# Patient Record
Sex: Female | Born: 1950
Health system: Southern US, Community
[De-identification: ages and names within clinical notes are randomized; demographics above are authoritative.]

## PROBLEM LIST (undated history)

## (undated) DIAGNOSIS — M8580 Other specified disorders of bone density and structure, unspecified site: Secondary | ICD-10-CM

## (undated) DIAGNOSIS — R519 Headache, unspecified: Secondary | ICD-10-CM

## (undated) DIAGNOSIS — E7800 Pure hypercholesterolemia, unspecified: Secondary | ICD-10-CM

## (undated) DIAGNOSIS — C439 Malignant melanoma of skin, unspecified: Secondary | ICD-10-CM

## (undated) DIAGNOSIS — G20 Parkinson's disease: Secondary | ICD-10-CM

## (undated) DIAGNOSIS — I10 Essential (primary) hypertension: Secondary | ICD-10-CM

## (undated) DIAGNOSIS — I447 Left bundle-branch block, unspecified: Secondary | ICD-10-CM

## (undated) DIAGNOSIS — E785 Hyperlipidemia, unspecified: Secondary | ICD-10-CM

## (undated) DIAGNOSIS — G20A1 Parkinson's disease without dyskinesia, without mention of fluctuations: Secondary | ICD-10-CM

## (undated) DIAGNOSIS — M797 Fibromyalgia: Secondary | ICD-10-CM

## (undated) DIAGNOSIS — F419 Anxiety disorder, unspecified: Secondary | ICD-10-CM

## (undated) DIAGNOSIS — I341 Nonrheumatic mitral (valve) prolapse: Secondary | ICD-10-CM

## (undated) DIAGNOSIS — J189 Pneumonia, unspecified organism: Secondary | ICD-10-CM

## (undated) HISTORY — DX: Other specified disorders of bone density and structure, unspecified site: M85.80

## (undated) HISTORY — PX: BLEPHAROPLASTY: SUR158

## (undated) HISTORY — PX: TONSILLECTOMY: SHX5217

## (undated) HISTORY — PX: WISDOM TOOTH EXTRACTION: SHX21

## (undated) HISTORY — PX: TUBAL LIGATION: SHX77

## (undated) HISTORY — PX: COLONOSCOPY: SHX174

## (undated) HISTORY — DX: Fibromyalgia: M79.7

## (undated) HISTORY — PX: OTHER SURGICAL HISTORY: SHX169

## (undated) HISTORY — DX: Malignant melanoma of skin, unspecified: C43.9

## (undated) HISTORY — PX: RECONSTRUCTION OF EYELID: SHX6576

## (undated) HISTORY — DX: Pure hypercholesterolemia, unspecified: E78.00

## (undated) HISTORY — DX: Parkinson's disease: G20

## (undated) HISTORY — DX: Parkinson's disease without dyskinesia, without mention of fluctuations: G20.A1

## (undated) HISTORY — DX: Essential (primary) hypertension: I10

---

## 1998-03-06 ENCOUNTER — Emergency Department (HOSPITAL_COMMUNITY): Admission: EM | Admit: 1998-03-06 | Discharge: 1998-03-06 | Payer: Self-pay | Admitting: Emergency Medicine

## 1998-04-09 ENCOUNTER — Emergency Department (HOSPITAL_COMMUNITY): Admission: EM | Admit: 1998-04-09 | Discharge: 1998-04-09 | Payer: Self-pay | Admitting: Emergency Medicine

## 1999-01-22 ENCOUNTER — Other Ambulatory Visit: Admission: RE | Admit: 1999-01-22 | Discharge: 1999-01-22 | Payer: Self-pay | Admitting: Family Medicine

## 1999-03-15 ENCOUNTER — Ambulatory Visit (HOSPITAL_COMMUNITY): Admission: RE | Admit: 1999-03-15 | Discharge: 1999-03-15 | Payer: Self-pay | Admitting: Gastroenterology

## 2000-06-22 ENCOUNTER — Emergency Department (HOSPITAL_COMMUNITY): Admission: EM | Admit: 2000-06-22 | Discharge: 2000-06-22 | Payer: Self-pay

## 2001-06-07 ENCOUNTER — Ambulatory Visit (HOSPITAL_COMMUNITY): Admission: RE | Admit: 2001-06-07 | Discharge: 2001-06-07 | Payer: Self-pay | Admitting: Family Medicine

## 2001-06-07 ENCOUNTER — Encounter: Payer: Self-pay | Admitting: Family Medicine

## 2004-05-10 ENCOUNTER — Other Ambulatory Visit: Admission: RE | Admit: 2004-05-10 | Discharge: 2004-05-10 | Payer: Self-pay | Admitting: Family Medicine

## 2004-05-14 ENCOUNTER — Ambulatory Visit (HOSPITAL_COMMUNITY): Admission: RE | Admit: 2004-05-14 | Discharge: 2004-05-14 | Payer: Self-pay | Admitting: Gastroenterology

## 2005-05-18 ENCOUNTER — Other Ambulatory Visit: Admission: RE | Admit: 2005-05-18 | Discharge: 2005-05-18 | Payer: Self-pay | Admitting: Family Medicine

## 2006-06-19 ENCOUNTER — Other Ambulatory Visit: Admission: RE | Admit: 2006-06-19 | Discharge: 2006-06-19 | Payer: Self-pay | Admitting: Family Medicine

## 2007-06-20 ENCOUNTER — Other Ambulatory Visit: Admission: RE | Admit: 2007-06-20 | Discharge: 2007-06-20 | Payer: Self-pay | Admitting: Family Medicine

## 2008-06-19 ENCOUNTER — Other Ambulatory Visit: Admission: RE | Admit: 2008-06-19 | Discharge: 2008-06-19 | Payer: Self-pay | Admitting: Family Medicine

## 2008-09-03 ENCOUNTER — Observation Stay (HOSPITAL_COMMUNITY): Admission: EM | Admit: 2008-09-03 | Discharge: 2008-09-04 | Payer: Self-pay | Admitting: Emergency Medicine

## 2009-06-09 ENCOUNTER — Encounter: Admission: RE | Admit: 2009-06-09 | Discharge: 2009-06-09 | Payer: Self-pay | Admitting: Family Medicine

## 2009-06-22 ENCOUNTER — Other Ambulatory Visit: Admission: RE | Admit: 2009-06-22 | Discharge: 2009-06-22 | Payer: Self-pay | Admitting: Family Medicine

## 2010-12-13 LAB — CBC
HCT: 39.2 % (ref 36.0–46.0)
MCHC: 34.1 g/dL (ref 30.0–36.0)
MCV: 86.8 fL (ref 78.0–100.0)
Platelets: 267 10*3/uL (ref 150–400)
RDW: 13.5 % (ref 11.5–15.5)
WBC: 7.1 10*3/uL (ref 4.0–10.5)

## 2010-12-13 LAB — DIFFERENTIAL
Basophils Absolute: 0 10*3/uL (ref 0.0–0.1)
Eosinophils Relative: 1 % (ref 0–5)
Lymphocytes Relative: 24 % (ref 12–46)
Lymphs Abs: 1.7 10*3/uL (ref 0.7–4.0)
Monocytes Absolute: 0.4 10*3/uL (ref 0.1–1.0)
Neutro Abs: 4.9 10*3/uL (ref 1.7–7.7)

## 2010-12-13 LAB — URINE MICROSCOPIC-ADD ON

## 2010-12-13 LAB — URINALYSIS, ROUTINE W REFLEX MICROSCOPIC
Bilirubin Urine: NEGATIVE
Glucose, UA: NEGATIVE mg/dL
Hgb urine dipstick: NEGATIVE
Ketones, ur: NEGATIVE mg/dL
Nitrite: NEGATIVE
Protein, ur: NEGATIVE mg/dL
Specific Gravity, Urine: 1.022 (ref 1.005–1.030)
Urobilinogen, UA: 0.2 mg/dL (ref 0.0–1.0)
pH: 6.5 (ref 5.0–8.0)

## 2010-12-13 LAB — CARDIAC PANEL(CRET KIN+CKTOT+MB+TROPI)
CK, MB: 1 ng/mL (ref 0.3–4.0)
CK, MB: 1 ng/mL (ref 0.3–4.0)
Relative Index: INVALID (ref 0.0–2.5)
Total CK: 60 U/L (ref 7–177)
Total CK: 62 U/L (ref 7–177)

## 2010-12-13 LAB — COMPREHENSIVE METABOLIC PANEL
AST: 27 U/L (ref 0–37)
Albumin: 3.4 g/dL — ABNORMAL LOW (ref 3.5–5.2)
BUN: 10 mg/dL (ref 6–23)
Calcium: 9 mg/dL (ref 8.4–10.5)
Creatinine, Ser: 0.89 mg/dL (ref 0.4–1.2)
GFR calc Af Amer: >60 mL/min (ref >60)
Total Bilirubin: 0.7 mg/dL (ref 0.3–1.2)
Total Protein: 6.6 g/dL (ref 6.0–8.3)

## 2010-12-13 LAB — CK TOTAL AND CKMB (NOT AT ARMC)
CK, MB: 1 ng/mL (ref 0.3–4.0)
Relative Index: INVALID (ref 0.0–2.5)

## 2010-12-13 LAB — TROPONIN I: Troponin I: 0.01 ng/mL (ref 0.00–0.06)

## 2010-12-13 LAB — LIPID PANEL
LDL Cholesterol: 80 mg/dL (ref 0–99)
Triglycerides: 175 mg/dL — ABNORMAL HIGH (ref <150)
VLDL: 35 mg/dL (ref 0–40)

## 2010-12-13 LAB — POCT CARDIAC MARKERS
CKMB, poc: <1 ng/mL — ABNORMAL LOW (ref 1.0–8.0)
Troponin i, poc: <0.05 ng/mL (ref 0.00–0.09)

## 2011-01-11 NOTE — H&P (Signed)
NAME:  Morgan [name], Morgan [name] NO.:  [id number]   MEDICAL RECORD NO.:  [id number]          PATIENT TYPE:  EMS   LOCATION:  MAJO                         FACILITY:  [facility]   PHYSICIAN:  Hollice Espy, M.D.DATE OF BIRTH:  11/25/1950   DATE OF ADMISSION:  09/03/2008  DATE OF DISCHARGE:                              HISTORY & PHYSICAL   PCP:  Dario Guardian, M.D.   CHIEF COMPLAINT:  Chest pain.   HISTORY OF PRESENT ILLNESS:  The patient is a 60 year old white female  with past medical history of hyperlipidemia and hypertension and mitral  valve prolapse who this morning woke up with some chest pain located  just left of midsternal, severe, nonradiating, no associated shortness  of breath, nausea, vomiting.  She tried a change in position and it did  not help.  Her husband gave her 2 baby aspirin and it immediately  resolved just as soon as the took the aspirin.  She had no further  episodes but because of the severity of the pain, she came to the  emergency room.  In the emergency room she was noted to have a normal  sinus rhythm on EKG, initial set of cardiac markers was normal.  Labs  were done.  The only finding was a possible left lung nodule seen on  chest x-ray, however a CT scan of chest showed no evidence of any lung  nodule.  The rest of her labs were completely normal.  Because of the  patient's age and other risk factors, it was felt best that she come in  for further observation and possible treatment.  Currently the patient  is doing well.  She denies any headaches, vision changes, dysphagia,  chest pain, palpitations, shortness of breath, wheeze, cough, abdominal  pain, hematuria, dysuria, constipation, diarrhea, focal extremity  numbness weakness or pain.  Review of systems otherwise negative.   PAST MEDICAL HISTORY:  1. Includes hypertension.  2. Hyperlipidemia.  3. Night terrors.   MEDICATIONS:  She is on Evista daily, HCTZ 12.5 daily, topical  lidocaine  p.r.n., Lipitor 10 daily, Xanax 0.25 p.o. q.h.s. p.r.n.   She has allergies to [facility]  and TYLENOL.   SOCIAL HISTORY:  No tobacco, she quit in 1999, alcohol or drug use.   FAMILY HISTORY:  Notable for dad with a CVA but no real heart history.   PHYSICAL EXAM:  VITALS ON ADMISSION:  Temp 97.7, heart rate 77, blood  pressure 158/81, respirations 12, O2 sat 97% room air.  GENERAL:  She is alert and oriented x3 in no apparent distress.  The  patient is normocephalic atraumatic.  Her mucous membranes are moist.  She has no carotid bruits.  HEART:  Regular rate and rhythm, S1-S2.  She has a small audible mitral  valve murmur.  LUNGS:  Clear to auscultation bilaterally.  ABDOMEN:  Soft, nontender, nondistended, positive bowel sounds.  EXTREMITIES:  Show no clubbing, cyanosis.  She has a trace pitting  edema.   LAB WORK:  White count 7.1, H and H 13.4 and 39, MCV of 87, platelet  count 267, no shift.  Sodium 138,  potassium 3.3, chloride 100, bicarb  30, BUN 10, creatinine 0.9, glucose 97.  LFTs are noted for an albumin  of 3.4.  UA shows moderate leukocyte esterase but 3-6 white cells, few  bacteria.  CPK 51, MB less than 1, troponin I less than 0.05.  Chest x-  ray shows possible left lung nodule, otherwise unremarkable, but CT scan  of the chest shows cholelithiasis incidentally found, and  negative for  left lung nodule.   ASSESSMENT AND PLAN:  1. Chest pain, which is slightly atypical:  Check enzymes x3.      Question if the patient needs an outpatient stress test as she had      an outpatient stress test and echo done 1 year ago secondary to at      that time her mitral valve followup.  2. Hypertension:  Continue meds.  3. Hyperlipidemia:  Continue Lipitor.  4. Hypokalemia:  Will replace.      Hollice Espy, M.D.  Electronically Signed     SKK/MEDQ  D:  09/03/2008  T:  09/03/2008  Job:  [id number]   cc:   Dario Guardian, M.D.

## 2011-01-14 NOTE — Op Note (Signed)
NAME:  Morgan [name], Morgan [name] NO.:  [id number]   MEDICAL RECORD NO.:  [id number]                   PATIENT TYPE:  AMB   LOCATION:  ENDO                                 FACILITY:  [facility]   PHYSICIAN:  James L. Malon Kindle., M.D.          DATE OF BIRTH:  1951/04/24   DATE OF PROCEDURE:  05/14/2004  DATE OF DISCHARGE:                                 OPERATIVE REPORT   PROCEDURE:  Colonoscopy.   MEDICATIONS GIVEN:  Fentanyl 150 mcg, Versed 14 mg IV.   INDICATIONS FOR PROCEDURE:  Strong family history of colon cancer in her  father, this is done as a five year follow up procedure.   DESCRIPTION OF PROCEDURE:  The procedure was explained and patient consent  obtained.  With the patient in the left lateral decubitus position, the  pediatric adjustable colonoscope was inserted.  The prep was marginal, we  had to do a lot of washing and irrigating but it was adequate.  We were able  to reach the cecum using abdominal pressure and position changes.  The cecum  and ascending colon were seen well.  In the cecum were several AVMs that  were not actively bleeding.  The ascending colon, transverse colon,  descending and sigmoid colon were seen well.  No polyps were seen.  The  rectum was free of polyps.  There was no significant diverticulosis.  The  scope was withdrawn.  The patient tolerated the procedure well.   ASSESSMENT:  1.  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  2.  Strong family history of colon cancer, V16.0.   PLAN:  Will recommend repeating colonoscopy in five years and recommend  yearly hemoccults by her family physician.                                               James L. Malon Kindle., M.D.    Waldron Session  D:  05/14/2004  T:  05/14/2004  Job:  [id number]   cc:   Talmadge Coventry, M.D.  [address]  [place]  [place] [address]  Fax: [phone]

## 2012-07-04 ENCOUNTER — Other Ambulatory Visit (HOSPITAL_COMMUNITY)
Admission: RE | Admit: 2012-07-04 | Discharge: 2012-07-04 | Disposition: A | Discharge status: Home / Self Care | Payer: Federal, State, Local not specified - PPO | Source: Ambulatory Visit | Attending: Family Medicine | Admitting: Family Medicine

## 2012-07-04 ENCOUNTER — Other Ambulatory Visit: Payer: Self-pay | Admitting: Family Medicine

## 2012-07-04 DIAGNOSIS — Z0000 Encounter for general adult medical examination without abnormal findings: Secondary | ICD-10-CM | POA: Insufficient documentation

## 2012-08-02 ENCOUNTER — Ambulatory Visit (INDEPENDENT_AMBULATORY_CARE_PROVIDER_SITE_OTHER): Payer: Federal, State, Local not specified - PPO | Admitting: Licensed Clinical Social Worker

## 2012-08-02 DIAGNOSIS — F4323 Adjustment disorder with mixed anxiety and depressed mood: Secondary | ICD-10-CM

## 2012-08-16 ENCOUNTER — Ambulatory Visit (INDEPENDENT_AMBULATORY_CARE_PROVIDER_SITE_OTHER): Payer: Federal, State, Local not specified - PPO | Admitting: Licensed Clinical Social Worker

## 2012-08-16 DIAGNOSIS — F4323 Adjustment disorder with mixed anxiety and depressed mood: Secondary | ICD-10-CM

## 2012-12-24 DIAGNOSIS — Z0289 Encounter for other administrative examinations: Secondary | ICD-10-CM

## 2012-12-29 ENCOUNTER — Other Ambulatory Visit: Payer: Self-pay | Admitting: Neurology

## 2013-01-15 ENCOUNTER — Ambulatory Visit (INDEPENDENT_AMBULATORY_CARE_PROVIDER_SITE_OTHER): Payer: Federal, State, Local not specified - PPO | Admitting: Neurology

## 2013-01-15 ENCOUNTER — Encounter: Payer: Self-pay | Admitting: Neurology

## 2013-01-15 VITALS — BP 144/92 | HR 96 | Temp 97.8°F | Resp 20 | Wt 182.0 lb

## 2013-01-15 DIAGNOSIS — G20 Parkinson's disease: Secondary | ICD-10-CM | POA: Insufficient documentation

## 2013-01-15 DIAGNOSIS — G609 Hereditary and idiopathic neuropathy, unspecified: Secondary | ICD-10-CM

## 2013-01-15 DIAGNOSIS — F17200 Nicotine dependence, unspecified, uncomplicated: Secondary | ICD-10-CM

## 2013-01-15 DIAGNOSIS — G20A1 Parkinson's disease without dyskinesia, without mention of fluctuations: Secondary | ICD-10-CM | POA: Insufficient documentation

## 2013-01-15 LAB — FOLATE: Folate: 22.1 ng/mL (ref >5.9)

## 2013-01-15 LAB — TSH: TSH: 0.8 u[IU]/mL (ref 0.35–5.50)

## 2013-01-15 LAB — VITAMIN B12: Vitamin B-12: 863 pg/mL (ref 211–911)

## 2013-01-15 NOTE — Progress Notes (Addendum)
Morgan [name] was seen today in the movement disorders clinic for neurologic consultation at the request of Dr. [name] at [facility] internal medicine.  She is accompanied by her husband who helps to supplement the history.  She has previously seen Dr. neurology, both Dr. Terrace Arabia and Dr. Thad Ranger.  I reviewed some of these notes.  The consultation is for the evaluation of Parkinson's disease.  The patient is a 62 y.o. right handed female reports that her first sx was "active dreaming."  However, the symptom that got her to the dr was L greater than R hand tremor, only when moving hands/drying hair.  This was in 2010.  She was started on carbidopa/levodopa just as a "trial" for 2 weeks and then it was stopped because it worked.  She was told that it was diagnostic of PD.  She was then started on the Mirapex ER 1.5 mg in 07/2009. and it was worked up to 3.0 mg.  She reports compulsive shopping (she overspends her "allowance).  She mostly internets shops.  She has always shopped but she has previously not gone over the "allowance."  She plays the lottery and plays more than previous.  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  She tried to get off the medication but it was unsuccessful because of worsening of PD.  She was placed on Azilect.  She has been on it for about 2 years.  She has not noted any benefits.    It was her impression that it made the mirapex last longer.  She feels strongly that the computer work and shopping became an issue because of the azilect.     Specific Symptoms:  Tremor: yes but rare now.  Unsure if due to anxiety Voice: no significant change Sleep:   Vivid Dreams:  yes, but doing better with klonopin  Acting out dreams:  yes, but less of a problem with klonopin Wet Pillows: no Postural symptoms:  yes (not bad but "klutzy" all life)  Falls?  yes but only because sat on a rolling chair and fell Bradykinesia symptoms: difficulty getting off the couch Loss of smell:  no Loss of taste:   no Urinary Incontinence:  no Difficulty Swallowing:  no Handwriting, micrographia: no Trouble with ADL's:  no (except sits down to put on pants; ADL's are slower)  Trouble buttoning clothing: no Depression:  no Memory changes:  yes but minimal (has to write things down but always did) Hallucinations:  no  visual distortions: yes (occasional) N/V:  no Lightheaded:  no  Syncope: no Diplopia:  no Dyskinesia:  no  Neuroimaging has previously been performed.  It is available for my review today.  It was done in 2010.  There was mild small vessel disease.  The formal report is as follows:   Clinical Data: Tremors enhance and legs.  History of melanoma    MRI HEAD WITHOUT CONTRAST    Technique:  Multiplanar, multiecho pulse sequences of the brain and surrounding structures were obtained according to standard protocol without intravenous contrast.    Comparison: None.    Findings:    Gray-white matter differentiation is normal.  The sella is appropriate in signal and morphology.  Clival signal is intact. Cerebellar tonsils are at the level foramen magnum.  Odontoid process, the predental space and the prevertebral soft tissues are within normal limits.    Scattered lymph nodes are seen in the post triangle regions bilaterally, the largest being approximately 9 .1 mm on the left.  No abnormalities seen on diffusion weighted images.    A few scattered punctate foci of signal hyperintensity are seen in the subcortical white matter bifrontally, and to a lesser degree the biparietal subcortical white matter. Discrete small T2 and FLAIR hyperintensities are seen in the biparietal subcortical white matter adjacent to the occipital horns.    The ventricles are normal.  Mastoids are clear.  Internal auditory canals are symmetrical in signal and morphology.  Flow voids are maintained in the major vessels at the cranial skull base.    Moderate inflammatory thickening of the mucosa  in the ethmoids and the maxillary sinuses is seen.  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 No  blood breakdown products seen on SPGR sequences.    . IMPRESSION: 1.  No evidence of acute ischemia. 2.  Non specific subcortical white matter changes supratentorially .  These may reflect areas of ischemic gliosis related to small vessel disease due to hypertension and/or diabetes versus less likely a demyelinating process or vasculitis. 3.  Prominent perivascular spaces versus lacunes involving the parietal subcortical white matter. 4.  Inflammatory thickening of the mucosa in the ethmoids and the maxillary sinuses.    PREVIOUS MEDICATIONS: Mirapex and azilect; levodopa for a few weeks to "prove" benefit  ALLERGIES:   Allergies  Allergen Reactions  . Sporanox (Itraconazole)   . Tylenol (Acetaminophen)     CURRENT MEDICATIONS:  Current Outpatient Prescriptions on File Prior to Visit  Medication Sig Dispense Refill  . AZILECT 1 MG TABS TAKE 1 TABLET DAILY  90 tablet  3  . MIRAPEX ER 3 MG TB24 TAKE 1 TABLET DAILY  90 tablet  3   No current facility-administered medications on file prior to visit.    PAST MEDICAL HISTORY:   Past Medical History  Diagnosis Date  . Osteopenia   . Hypertension   . Parkinson disease   . Fibromyalgia   . Hypercholesteremia     PAST SURGICAL HISTORY:   Past Surgical History  Procedure Laterality Date  . Tonsillectomy    . Tubal ligation      SOCIAL HISTORY:   History   Social History  . Marital Status: Married    Spouse Name: N/A    Number of Children: N/A  . Years of Education: N/A   Occupational History  . Not on file.   Social History Main Topics  . Smoking status: Former Smoker    Quit date: 01/15/1998  . Smokeless tobacco: Never Used  . Alcohol Use: Yes     Comment: monthly glass of wine.  . Drug Use: No  . Sexually  Active: Not on file   Other Topics Concern  . Not on file   Social History Narrative  . No narrative on file    FAMILY HISTORY:   No family status information on file.    ROS:  Feels like walks on "rocks."  A complete 10 system review of systems was obtained and was unremarkable apart from what is mentioned above.  PHYSICAL EXAMINATION:    VITALS:   Filed Vitals:   01/15/13 1026  BP: 144/92  Pulse: 96  Temp: 97.8 F (36.6 C)  Resp: 20  Weight: 182 lb (82.555 kg)    GEN:  The patient appears stated age and is in NAD. HEENT:  Normocephalic, atraumatic.  The mucous membranes are moist. The superficial temporal arteries are without ropiness or  tenderness. CV:  RRR Lungs:  CTAB Neck/HEME:  There are no carotid bruits bilaterally.  Neurological examination:  Orientation: The patient is alert and oriented x3. Fund of knowledge is appropriate.  Recent and remote memory are intact.  Attention and concentration are normal.    Able to name objects and repeat phrases. Cranial nerves: There is good facial symmetry. Pupils are equal round and reactive to light bilaterally. Fundoscopic exam reveals clear margins bilaterally. Extraocular muscles are intact. The visual fields are full to confrontational testing. The speech is fluent and clear. Soft palate rises symmetrically and there is no tongue deviation. Hearing is intact to conversational tone. Sensation: Sensation is intact to light and pinprick throughout (facial, trunk, extremities). Pinprick is decreased in a distal fashion (stocking distribution).  Vibration is decreased distally. There is no extinction with double simultaneous stimulation. There is no sensory dermatomal level identified. Motor: Strength is 5/5 in the bilateral upper and lower extremities.   Shoulder shrug is equal and symmetric.  There is no pronator drift. Deep tendon reflexes: Deep tendon reflexes are 3/4 at the bilateral biceps, triceps, brachioradialis, patella  and achilles. Plantar responses are downgoing bilaterally.  Movement examination: Tone: There is increased tone in the bilateral upper extremities, L worse than R but overall mild increased bilaterally and mod with activation procedures.  The tone in the lower extremities is normal.  Abnormal movements: none Coordination:  There is definite decremation with RAM's, seen most prominantely on the L but it was on the R as well with heel taps and finger taps. Gait and Station: The patient has no difficulty arising out of a deep-seated chair without the use of the hands. The patient's stride length is normal with slight decrease arm swing on the L.  The patient has a negative pull test.      ASSESSMENT/PLAN:  1.  Parkinsonism.  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    -We discussed the diagnosis as well as pathophysiology of the disease.  We discussed treatment options as well as prognostic indicators.  Patient education was provided.  -Greater than 50% of the 80 minute visit was spent in counseling answering questions and talking about what to expect now as well as in the future.  We talked about medication options as well as potential future surgical options.  We talked about safety in the home.  -I am concerned about the patient's Mirapex.  She is having compulsive behaviors.  I wanted to decrease the dose.  She tried this in the past, but does not think that it was as effective, but admits that the dosage was down to 1.5 mg every other day.  I wanted to try and decrease the dosage to Mirapex ER 2.25 mg daily, but in the end the patient wanted to stop the Azilect first.  I feel confident that the Azilect likely is not doing much for her, so I have no objection to this, but I also don't think is causing the compulsive behaviors, which are well known side effects of the agonists.  -I will refer the patient to the Parkinson's program at the [facility].  We talked about the importance of cardiovascular exercise in Parkinson's disease.  -I. gave the patient and her husband a booklet regarding nutrition in Parkinson's. 2.  probable peripheral neuropathy, idiopathic.  -She does have physical examination evidence of this, and is complaining about feeling like she is walking on rocks.  I am going to check  a B12, folate, RPR and serum and urinary protein electrophoresis.  On 01/07/2013 she had a glucose that was 75.  We talked about safety associated with peripheral neuropathy.  -A 2010 NCV done at [facility] was normal but it doesn't appear that EMG was done. 3.  history of melanoma.  -The patient did not tell me this in the exam room, and it is not in her primary care records I received.  However, once she left and it the official report of her MRI, it was listed there.  I will discuss this at her next visit.  There is some education needed if we decide to start levodopa in the future. 4.  history of tobacco abuse.  -While the patient has given up smoking, she is using quite a bit of Nicorette gum per day.  I talked to her about the fact that she really needs to stop this as well.  She asked me if it was protective for Parkinson's, and I told her that while smoking may decrease her risk factor for Parkinson's, there is no evidence that using tobacco products slows Parkinson's one has the diagnosis. 5.  Follow up in 3 months.  She is to call me in 2 weeks and let me know how she is doing of Azilect. We will then consider decreasing the Mirapex. 6.  Face to face time was 85 min

## 2013-01-15 NOTE — Patient Instructions (Addendum)
1.  Stop azilect 2.  Call me in 2 weeks.  If shopping and computer use not better, then I will decrease your mirapex 3.  Exercise safely 4.  I will send referrals for the Parkinosons program therapies....let me know if they dont contact you 5.  I will see you in 3 months but feel free to call if you need me before that

## 2013-01-15 NOTE — Addendum Note (Signed)
Addended by: Benay Spice on: 01/15/2013 03:13 PM   Modules accepted: Orders

## 2013-01-15 NOTE — Addendum Note (Signed)
Addended by: Benay Spice on: 01/15/2013 02:10 PM   Modules accepted: Orders

## 2013-01-17 LAB — IMMUNOFIXATION ELECTROPHORESIS
IgA: 177 mg/dL (ref 69–380)
IgG (Immunoglobin G), Serum: 1090 mg/dL (ref 690–1700)
IgM, Serum: 240 mg/dL (ref 52–322)
Total Protein, Serum Electrophoresis: 7.1 g/dL (ref 6.0–8.3)
Total Protein, Serum Electrophoresis: 7.1 g/dL (ref 6.0–8.3)

## 2013-01-17 LAB — PROTEIN ELECTROPHORESIS, SERUM
Gamma Globulin: 16 % (ref 11.1–18.8)
Total Protein, Serum Electrophoresis: 7.2 g/dL (ref 6.0–8.3)

## 2013-01-17 LAB — PROTEIN ELECTROPHORESIS, URINE REFLEX: Total Protein, Urine: 5 mg/dL

## 2013-01-18 ENCOUNTER — Ambulatory Visit: Payer: Federal, State, Local not specified - PPO | Attending: Neurology | Admitting: Physical Therapy

## 2013-01-18 DIAGNOSIS — G20A1 Parkinson's disease without dyskinesia, without mention of fluctuations: Secondary | ICD-10-CM | POA: Insufficient documentation

## 2013-01-18 DIAGNOSIS — R269 Unspecified abnormalities of gait and mobility: Secondary | ICD-10-CM | POA: Insufficient documentation

## 2013-01-18 DIAGNOSIS — G20 Parkinson's disease: Secondary | ICD-10-CM | POA: Insufficient documentation

## 2013-01-18 DIAGNOSIS — IMO0001 Reserved for inherently not codable concepts without codable children: Secondary | ICD-10-CM | POA: Insufficient documentation

## 2013-01-22 ENCOUNTER — Ambulatory Visit: Payer: Federal, State, Local not specified - PPO | Admitting: Physical Therapy

## 2013-01-29 ENCOUNTER — Ambulatory Visit: Payer: Federal, State, Local not specified - PPO | Attending: Neurology | Admitting: Physical Therapy

## 2013-01-29 DIAGNOSIS — G20A1 Parkinson's disease without dyskinesia, without mention of fluctuations: Secondary | ICD-10-CM | POA: Insufficient documentation

## 2013-01-29 DIAGNOSIS — G20 Parkinson's disease: Secondary | ICD-10-CM | POA: Insufficient documentation

## 2013-01-29 DIAGNOSIS — IMO0001 Reserved for inherently not codable concepts without codable children: Secondary | ICD-10-CM | POA: Insufficient documentation

## 2013-01-29 DIAGNOSIS — R269 Unspecified abnormalities of gait and mobility: Secondary | ICD-10-CM | POA: Insufficient documentation

## 2013-01-31 ENCOUNTER — Ambulatory Visit: Payer: Federal, State, Local not specified - PPO | Admitting: Physical Therapy

## 2013-02-05 ENCOUNTER — Ambulatory Visit: Payer: Federal, State, Local not specified - PPO | Admitting: Occupational Therapy

## 2013-02-05 ENCOUNTER — Ambulatory Visit: Payer: Federal, State, Local not specified - PPO | Admitting: Physical Therapy

## 2013-02-06 ENCOUNTER — Telehealth: Payer: Self-pay

## 2013-02-06 NOTE — Telephone Encounter (Signed)
Noted.  Thanks for calling.  Will discuss more in detail at next visit.

## 2013-02-06 NOTE — Telephone Encounter (Signed)
Pt calling to update [place] on how she is doing off the Azilect.  She said she is doing very well she no longer has the impulsive shopping urges she was having.

## 2013-02-07 ENCOUNTER — Ambulatory Visit: Payer: Federal, State, Local not specified - PPO | Admitting: Physical Therapy

## 2013-02-12 ENCOUNTER — Ambulatory Visit: Payer: Federal, State, Local not specified - PPO | Admitting: Occupational Therapy

## 2013-02-12 ENCOUNTER — Ambulatory Visit: Payer: Federal, State, Local not specified - PPO | Admitting: Physical Therapy

## 2013-02-14 ENCOUNTER — Ambulatory Visit: Payer: Federal, State, Local not specified - PPO | Admitting: Occupational Therapy

## 2013-02-14 ENCOUNTER — Ambulatory Visit: Payer: Federal, State, Local not specified - PPO | Admitting: Physical Therapy

## 2013-02-19 ENCOUNTER — Ambulatory Visit: Payer: Federal, State, Local not specified - PPO | Admitting: Physical Therapy

## 2013-02-21 ENCOUNTER — Ambulatory Visit: Payer: Federal, State, Local not specified - PPO | Admitting: Physical Therapy

## 2013-03-05 ENCOUNTER — Ambulatory Visit: Payer: Federal, State, Local not specified - PPO | Attending: Neurology | Admitting: Occupational Therapy

## 2013-03-05 ENCOUNTER — Ambulatory Visit: Payer: Federal, State, Local not specified - PPO | Admitting: Physical Therapy

## 2013-03-05 DIAGNOSIS — IMO0001 Reserved for inherently not codable concepts without codable children: Secondary | ICD-10-CM | POA: Insufficient documentation

## 2013-03-05 DIAGNOSIS — G20A1 Parkinson's disease without dyskinesia, without mention of fluctuations: Secondary | ICD-10-CM | POA: Insufficient documentation

## 2013-03-05 DIAGNOSIS — R269 Unspecified abnormalities of gait and mobility: Secondary | ICD-10-CM | POA: Insufficient documentation

## 2013-03-05 DIAGNOSIS — G20 Parkinson's disease: Secondary | ICD-10-CM | POA: Insufficient documentation

## 2013-03-06 ENCOUNTER — Other Ambulatory Visit: Payer: Self-pay | Admitting: Family Medicine

## 2013-03-07 ENCOUNTER — Ambulatory Visit: Payer: Federal, State, Local not specified - PPO | Admitting: Physical Therapy

## 2013-03-07 ENCOUNTER — Ambulatory Visit: Payer: Federal, State, Local not specified - PPO | Admitting: Occupational Therapy

## 2013-03-11 ENCOUNTER — Ambulatory Visit
Admission: RE | Admit: 2013-03-11 | Discharge: 2013-03-11 | Disposition: A | Discharge status: Home / Self Care | Payer: Federal, State, Local not specified - PPO | Source: Ambulatory Visit | Attending: Family Medicine | Admitting: Family Medicine

## 2013-03-12 ENCOUNTER — Ambulatory Visit: Payer: Federal, State, Local not specified - PPO | Admitting: Occupational Therapy

## 2013-03-14 ENCOUNTER — Ambulatory Visit: Payer: Federal, State, Local not specified - PPO | Admitting: Occupational Therapy

## 2013-03-19 ENCOUNTER — Ambulatory Visit: Payer: Federal, State, Local not specified - PPO | Admitting: Occupational Therapy

## 2013-03-21 ENCOUNTER — Ambulatory Visit: Payer: Federal, State, Local not specified - PPO | Admitting: Occupational Therapy

## 2013-04-05 ENCOUNTER — Ambulatory Visit: Payer: Self-pay | Admitting: Nurse Practitioner

## 2013-04-16 ENCOUNTER — Ambulatory Visit: Payer: Federal, State, Local not specified - PPO | Admitting: Neurology

## 2013-04-19 ENCOUNTER — Encounter: Payer: Self-pay | Admitting: Neurology

## 2013-04-19 ENCOUNTER — Ambulatory Visit (INDEPENDENT_AMBULATORY_CARE_PROVIDER_SITE_OTHER): Payer: Federal, State, Local not specified - PPO | Admitting: Neurology

## 2013-04-19 VITALS — BP 128/88 | HR 116 | Temp 98.1°F | Resp 20 | Wt 183.0 lb

## 2013-04-19 DIAGNOSIS — G20 Parkinson's disease: Secondary | ICD-10-CM

## 2013-04-19 DIAGNOSIS — G609 Hereditary and idiopathic neuropathy, unspecified: Secondary | ICD-10-CM

## 2013-04-19 DIAGNOSIS — F17200 Nicotine dependence, unspecified, uncomplicated: Secondary | ICD-10-CM

## 2013-04-19 MED ORDER — CARBIDOPA-LEVODOPA 25-100 MG PO TABS: 1.0000 | ORAL_TABLET | Freq: Three times a day (TID) | ORAL | Status: DC

## 2013-04-19 MED ORDER — CYMBALTA 60 MG PO CPEP: 60.0000 mg | ORAL_CAPSULE | Freq: Every day | ORAL | Status: DC

## 2013-04-19 MED ORDER — MIRAPEX ER 3 MG PO TB24: 1.0000 | ORAL_TABLET | Freq: Every day | ORAL | Status: DC

## 2013-04-19 MED ORDER — CLONAZEPAM 1 MG PO TABS: 1.0000 mg | ORAL_TABLET | Freq: Every day | ORAL | Status: DC

## 2013-04-19 NOTE — Patient Instructions (Addendum)
1.  Start carbidopa/levodopa as follows:      Week 1:  1/2 tab three times a day before meals x 1 wk, Week 2:  1/2 in am & noon & 1 in evening for a week, Week 3  1/2 in am &1 at noon &one in evening for a week, Week 4:  1 tablet three times a day before meals 2.  Exercise 3.  Brand name mirapex and brand name cymbalta sent to [facility].  Others will go to local pharmacy 4.  I will see you back in December.  Enjoy your vacations!

## 2013-04-19 NOTE — Progress Notes (Signed)
Morgan [name] was seen today in the movement disorders clinic for neurologic consultation at the request of Dr. [name] at [facility].  She is accompanied by her husband who helps to supplement the history.  She has previously seen Dr. neurology, both Dr. Terrace Arabia and Dr. Thad Ranger.  I reviewed some of these notes.  The consultation is for the evaluation of Parkinson's disease.  The patient is a 62 y.o. right handed female reports that her first sx was "active dreaming."  However, the symptom that got her to the dr was L greater than R hand tremor, only when moving hands/drying hair.  This was in 2010.  She was started on carbidopa/levodopa just as a "trial" for 2 weeks and then it was stopped because it worked.  She was told that it was diagnostic of PD.  She was then started on the Mirapex ER 1.5 mg in 07/2009. and it was worked up to 3.0 mg.  She reports compulsive shopping (she overspends her "allowance).  She mostly internets shops.  She has always shopped but she has previously not gone over the "allowance."  She plays the lottery and plays more than previous.  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  She tried to get off the medication but it was unsuccessful because of worsening of PD.  She was placed on Azilect.  She has been on it for about 2 years.  She has not noted any benefits.    It was her impression that it made the mirapex last longer.  She feels strongly that the computer work and shopping became an issue because of the azilect.   04/19/13 update:  The patient presents today with her husband who supplements the history.  Once she discontinued the Azilect, she felt that her compulsive shopping got better.  Her husband feels that she is not exercising enough, but the patient estimates that she perhaps exercises 4 days per week, but not to the intensity that her husband would like.  She had one near fall since our last visit, but she was on a trail in [place] with 2 walking sticks and  one of them something to the ground and her husband caught her.  She is still taking clonazepam at night.  Her husband thinks that she takes it too early and falls asleep on the couch, but she denies this.  There have not been hallucinations.  There has not been nausea or vomiting.  She has been slower.    Neuroimaging has previously been performed.  It is available for my review today.  It was done in 2010.  There was mild small vessel disease.  The formal report is as follows:   Clinical Data: Tremors enhance and legs.  History of melanoma    MRI HEAD WITHOUT CONTRAST    Technique:  Multiplanar, multiecho pulse sequences of the brain and surrounding structures were obtained according to standard protocol without intravenous contrast.    Comparison: None.    Findings:    Gray-white matter differentiation is normal.  The sella is appropriate in signal and morphology.  Clival signal is intact. Cerebellar tonsils are at the level foramen magnum.  Odontoid process, the predental space and the prevertebral soft tissues are within normal limits.    Scattered lymph nodes are seen in the post triangle regions bilaterally, the largest being approximately 9 .1 mm on the left.    No abnormalities seen on diffusion weighted images.    A few scattered punctate foci  of signal hyperintensity are seen in the subcortical white matter bifrontally, and to a lesser degree the biparietal subcortical white matter. Discrete small T2 and FLAIR hyperintensities are seen in the biparietal subcortical white matter adjacent to the occipital horns.    The ventricles are normal.  Mastoids are clear.  Internal auditory canals are symmetrical in signal and morphology.  Flow voids are maintained in the major vessels at the cranial skull base.    Moderate inflammatory thickening of the mucosa in the ethmoids and the maxillary sinuses is seen.  The visualized orbital contents are grossly normal though  somewhat obscured by artifact related to a non removal bridge  in the upper mouth. No  blood breakdown products seen on SPGR sequences.    . IMPRESSION: 1.  No evidence of acute ischemia. 2.  Non specific subcortical white matter changes supratentorially .  These may reflect areas of ischemic gliosis related to small vessel disease due to hypertension and/or diabetes versus less likely a demyelinating process or vasculitis. 3.  Prominent perivascular spaces versus lacunes involving the parietal subcortical white matter. 4.  Inflammatory thickening of the mucosa in the ethmoids and the maxillary sinuses.    PREVIOUS MEDICATIONS: Mirapex and azilect; levodopa for a few weeks to "prove" benefit  ALLERGIES:   Allergies  Allergen Reactions  . Sporanox [Itraconazole]   . Tylenol [Acetaminophen]     CURRENT MEDICATIONS:  Current Outpatient Prescriptions on File Prior to Visit  Medication Sig Dispense Refill  . aspirin 325 MG tablet Take 325 mg by mouth daily.      Marland Kitchen atorvastatin (LIPITOR) 10 MG tablet Take 10 mg by mouth daily.      . cholecalciferol (VITAMIN D) 1000 UNITS tablet Take 1,000 Units by mouth daily. Two daily      . co-enzyme Q-10 30 MG capsule Take 30 mg by mouth daily. Taking 60mg       . DULoxetine (CYMBALTA) 60 MG capsule Take 60 mg by mouth daily.      Chilton Si Tea 250 MG CAPS Take by mouth daily.      . hydrochlorothiazide (HYDRODIURIL) 25 MG tablet Take 25 mg by mouth daily.      . Magnesium 250 MG TABS Take by mouth 2 (two) times daily.      Marland Kitchen MIRAPEX ER 3 MG TB24 TAKE 1 TABLET DAILY  90 tablet  3  . Multiple Vitamins-Minerals (ICAPS PO) Take by mouth.      . raloxifene (EVISTA) 60 MG tablet Take 60 mg by mouth daily.       No current facility-administered medications on file prior to visit.    PAST MEDICAL HISTORY:   Past Medical History  Diagnosis Date  . Osteopenia   . Hypertension   . Parkinson disease   . Fibromyalgia   . Hypercholesteremia      controlled on medication  . Melanoma     PAST SURGICAL HISTORY:   Past Surgical History  Procedure Laterality Date  . Tonsillectomy    . Tubal ligation      SOCIAL HISTORY:   History   Social History  . Marital Status: Married    Spouse Name: N/A    Number of Children: N/A  . 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 Smoking status: Former Smoker    Quit date: 01/15/1998  . Smokeless tobacco: Never Used  Comment: chews Nicorette, 10-12/day  . Alcohol Use: Yes     Comment: monthly glass of wine.  . Drug Use: No  . Sexual Activity: Not on file   Other Topics Concern  . Not on file   Social History Narrative  . No narrative on file    FAMILY HISTORY:   Family Status  Relation Status Death Age  . Mother Deceased     colon CA  . Father Alive     healthy  . Brother Alive     healthy  . Sister Alive     4, healthy  . Child Alive     healthy    ROS:  Feels like walks on "rocks."  A complete 10 system review of systems was obtained and was unremarkable apart from what is mentioned above.  PHYSICAL EXAMINATION:    VITALS:   Filed Vitals:   04/19/13 1451  BP: 128/88  Pulse: 116  Temp: 98.1 F (36.7 C)  Resp: 20  Weight: 183 lb (83.008 kg)    GEN:  The patient appears stated age and is in NAD. HEENT:  Normocephalic, atraumatic.  The mucous membranes are moist. The superficial temporal arteries are without ropiness or tenderness. CV:  RRR Lungs:  CTAB Neck/HEME:  There are no carotid bruits bilaterally.  Neurological examination:  Orientation: The patient is alert and oriented x3. Fund of knowledge is appropriate.  Recent and remote memory are intact.  Attention and concentration are normal.    Able to name objects and repeat phrases. Cranial nerves: There is good facial symmetry.  There is bilateral pseudoptosis from lid lag. Pupils are equal round and reactive to  light bilaterally. Fundoscopic exam reveals clear margins bilaterally. Extraocular muscles are intact. The visual fields are full to confrontational testing. The speech is fluent and clear. Soft palate rises symmetrically and there is no tongue deviation. Hearing is intact to conversational tone. Sensation: Sensation is intact to light and pinprick throughout (facial, trunk, extremities). Pinprick is decreased in a distal fashion (stocking distribution).  Vibration is decreased distally. There is no extinction with double simultaneous stimulation. There is no sensory dermatomal level identified. Motor: Strength is 5/5 in the bilateral upper and lower extremities.   Shoulder shrug is equal and symmetric.  There is no pronator drift. Deep tendon reflexes: Deep tendon reflexes are 3/4 at the bilateral biceps, triceps, brachioradialis, patella and achilles. Plantar responses are downgoing bilaterally.  Movement examination: Tone: There is increased tone in the left upper extremity, overall moderate.  The tone in the right upper extremity is normal.  The tone in the lower extremities is normal.  Abnormal movements: none Coordination:  There is definite decremation with RAM's, seen most prominantely on the L but it was on the R as well with heel taps and finger taps. Gait and Station: The patient has no difficulty arising out of a deep-seated chair without the use of the hands. The patient's stride length is normal with slight decrease arm swing on the L.  The patient has a negative pull test.      LABS:  Lab Results  Component Value Date   TSH 0.80 01/15/2013   Lab Results  Component Value Date   VITAMINB12 863 01/15/2013    SPEP/UPEP negative.    Lab Results  Component Value Date   FOLATE 22.1 01/15/2013     ASSESSMENT/PLAN:  1.  Parkinsonism.  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    -We  discussed the diagnosis as well as pathophysiology of the  disease.  We discussed treatment options as well as prognostic indicators.  Patient education was provided.  -I am concerned about the patient's Mirapex.  She was having compulsive behaviors.  She feels that this went away with the discontinuation of the Azilect, but we will need to watch this if we are going to maintain her on Mirapex.  I do not want to increase this dosage.  -I think that the patient would benefit from the addition of levodopa therapy.  We talked extensively about risks and benefits.  I will give her a titration so that she work her way up to carbidopa/levodopa 25/100, one tablet 3 times a day.  -We talked extensively about the importance of cardiovascular exercise. 2.  probable peripheral neuropathy, idiopathic.  -She does have physical examination evidence of this.  Her laboratory workup has been unremarkable.  She is on Cymbalta, although that is primarily for fibromyalgia. 3.  history of melanoma.  -There is some old literature that suggests that the addition of levodopa can increase risk of return melanoma, but new literature suggests that this is not the etiology.  4.  history of tobacco abuse.  -While the patient has given up smoking, she is using quite a bit of Nicorette gum per day.  I talked to her about the fact that she really needs to stop this as well.  She asked me if it was protective for Parkinson's, and I told her that while smoking may decrease her risk factor for Parkinson's, there is no evidence that using tobacco products slows Parkinson's one has the diagnosis. 5.  Insomnia.  -She is doing fine with clonazepam.  Risks, benefits, side effects and alternative therapies were discussed.  The opportunity to ask questions was given and they were answered to the best of my ability.  The patient expressed understanding and willingness to follow the outlined treatment protocols. 6.  Face to face time was 45 min

## 2013-04-25 ENCOUNTER — Encounter (INDEPENDENT_AMBULATORY_CARE_PROVIDER_SITE_OTHER): Payer: Self-pay | Admitting: General Surgery

## 2013-04-25 ENCOUNTER — Ambulatory Visit (INDEPENDENT_AMBULATORY_CARE_PROVIDER_SITE_OTHER): Payer: BC Managed Care – PPO | Admitting: General Surgery

## 2013-04-25 VITALS — BP 142/100 | HR 100 | Temp 98.1°F | Resp 15 | Ht 65.0 in | Wt 184.0 lb

## 2013-04-25 DIAGNOSIS — K8020 Calculus of gallbladder without cholecystitis without obstruction: Secondary | ICD-10-CM

## 2013-04-25 NOTE — Progress Notes (Signed)
Patient ID: Morgan [name], female   DOB: [birth year], 62 y.o.   MRN: [id number]  Chief Complaint  Patient presents with  . New Evaluation    GB    HPI Morgan [name] is a 62 y.o. female.   HPI  She is referred by Dr. Katrinka Blazing because of cholelithiasis. She had an elevation of one of her liver function tests. An ultrasound was performed which demonstrated diffuse fatty infiltration of the liver. Multiple gallstones were seen. The gallbladder wall was not thickened.  Common bile duct diameter was normal.  She is essentially asymptomatic from this at this time. No family history of gallbladder disease.  Past Medical History  Diagnosis Date  . Osteopenia   . Hypertension   . Parkinson disease   . Fibromyalgia   . Hypercholesteremia     controlled on medication  . Melanoma     Past Surgical History  Procedure Laterality Date  . Tonsillectomy    . Tubal ligation      Family History  Problem Relation Age of Onset  . Cancer Mother     colon  . Cancer Maternal Grandmother     colon    Social History History  Substance Use Topics  . Smoking status: Former Smoker    Quit date: 09/29/1997  . Smokeless tobacco: Never Used     Comment: chews Nicorette, 10-12/day  . Alcohol Use: Yes     Comment: monthly glass of wine.    Allergies  Allergen Reactions  . Sporanox [Itraconazole]   . Tylenol [Acetaminophen]     Current Outpatient Prescriptions  Medication Sig Dispense Refill  . ALPRAZolam (XANAX) 0.25 MG tablet Take 0.25 mg by mouth 1 day or 1 dose.      Marland Kitchen aspirin 325 MG tablet Take 325 mg by mouth daily.      Marland Kitchen atorvastatin (LIPITOR) 10 MG tablet Take 10 mg by mouth daily.      . carbidopa-levodopa (SINEMET IR) 25-100 MG per tablet Take 1 tablet by mouth 3 (three) times daily.  90 tablet  5  . cholecalciferol (VITAMIN D) 1000 UNITS tablet Take 1,000 Units by mouth daily. Two daily      . clonazePAM (KLONOPIN) 1 MG tablet Take 1 tablet (1 mg total) by mouth at bedtime.  30  tablet  4  . co-enzyme Q-10 30 MG capsule Take 30 mg by mouth daily. Taking 60mg       . CYMBALTA 60 MG capsule Take 1 capsule (60 mg total) by mouth daily.  90 capsule  3  . Green Tea 250 MG CAPS Take by mouth daily.      . hydrochlorothiazide (HYDRODIURIL) 25 MG tablet Take 25 mg by mouth daily.      . Magnesium 250 MG TABS Take by mouth 2 (two) times daily.      . methocarbamol (ROBAXIN) 750 MG tablet Take 750 mg by mouth 4 (four) times daily.      Marland Kitchen MIRAPEX ER 3 MG TB24 Take 1 tablet (3 mg total) by mouth daily.  90 tablet  3  . Multiple Vitamins-Minerals (ICAPS PO) Take by mouth.      . raloxifene (EVISTA) 60 MG tablet Take 60 mg by mouth daily.       No current facility-administered medications for this visit.    Review of Systems Review of Systems  Constitutional: Negative.   Respiratory: Negative.   Cardiovascular: Negative.   Gastrointestinal: Positive for constipation. Negative for abdominal pain.    Blood pressure  142/100, pulse 100, temperature 98.1 F (36.7 C), temperature source Temporal, resp. rate 15, height 5\' 5"  (1.651 m), weight 184 lb (83.462 kg).  Physical Exam Physical Exam  Constitutional: No distress.  overweight  HENT:  Head: Normocephalic and atraumatic.  Eyes: EOM are normal. No scleral icterus.  Abdominal: Soft. She exhibits no distension and no mass. There is no tenderness.  Neurological: She is alert.  Skin: Skin is warm and dry.    Data Reviewed Ultrasound report. No children Dr. Katrinka Blazing. Laboratory tests.  Assessment    1. Asymptomatic cholelithiasis. Currently, I do not think she is an indication for cholecystectomy.  2.  Diffuse fatty infiltration of the liver. This is the more concerning problem. This was Lipitor could explain her liver function test abnormality.     Plan    Strict low fat diet was advised. Small potential to develop cirrhosis due to fatty liver was discussed with her. If she develops symptomatic cholelithiasis, I have  advised her to call back and then we can discuss cholecystectomy.        Earl Losee J 04/25/2013, 1:57 PM

## 2013-04-25 NOTE — Patient Instructions (Signed)
Start low fat diet as we discussed. If you have any symptoms of gallbladder problems please call.

## 2013-05-28 ENCOUNTER — Encounter (INDEPENDENT_AMBULATORY_CARE_PROVIDER_SITE_OTHER): Payer: Self-pay

## 2013-07-05 ENCOUNTER — Encounter (INDEPENDENT_AMBULATORY_CARE_PROVIDER_SITE_OTHER): Payer: Self-pay

## 2013-08-08 ENCOUNTER — Ambulatory Visit: Payer: Federal, State, Local not specified - PPO | Attending: Neurology | Admitting: Physical Therapy

## 2013-08-08 ENCOUNTER — Ambulatory Visit: Payer: Federal, State, Local not specified - PPO | Admitting: Physical Therapy

## 2013-08-08 ENCOUNTER — Ambulatory Visit: Payer: Federal, State, Local not specified - PPO | Admitting: Occupational Therapy

## 2013-08-08 DIAGNOSIS — G20A1 Parkinson's disease without dyskinesia, without mention of fluctuations: Secondary | ICD-10-CM | POA: Insufficient documentation

## 2013-08-08 DIAGNOSIS — G20 Parkinson's disease: Secondary | ICD-10-CM | POA: Insufficient documentation

## 2013-08-08 DIAGNOSIS — IMO0001 Reserved for inherently not codable concepts without codable children: Secondary | ICD-10-CM | POA: Insufficient documentation

## 2013-08-08 DIAGNOSIS — R269 Unspecified abnormalities of gait and mobility: Secondary | ICD-10-CM | POA: Insufficient documentation

## 2013-08-13 ENCOUNTER — Encounter: Payer: Self-pay | Admitting: Neurology

## 2013-08-13 ENCOUNTER — Ambulatory Visit (INDEPENDENT_AMBULATORY_CARE_PROVIDER_SITE_OTHER): Payer: Federal, State, Local not specified - PPO | Admitting: Neurology

## 2013-08-13 VITALS — BP 148/90 | HR 70 | Temp 98.0°F | Resp 16 | Ht 65.0 in | Wt 185.3 lb

## 2013-08-13 DIAGNOSIS — G609 Hereditary and idiopathic neuropathy, unspecified: Secondary | ICD-10-CM

## 2013-08-13 DIAGNOSIS — G20 Parkinson's disease: Secondary | ICD-10-CM

## 2013-08-13 MED ORDER — PRAMIPEXOLE DIHYDROCHLORIDE ER 2.25 MG PO TB24: 1.0000 | ORAL_TABLET | Freq: Every day | ORAL | Status: DC

## 2013-08-13 NOTE — Progress Notes (Signed)
Morgan [name] was seen today in the movement disorders clinic for neurologic consultation at the request of Dr. [name] at [facility] internal medicine.  She is accompanied by her husband who helps to supplement the history.  She has previously seen Dr. neurology, both Dr. Terrace Arabia and Dr. Thad Ranger.  I reviewed some of these notes.  The consultation is for the evaluation of Parkinson's disease.  The patient is a 62 y.o. right handed female reports that her first sx was "active dreaming."  However, the symptom that got her to the dr was L greater than R hand tremor, only when moving hands/drying hair.  This was in 2010.  She was started on carbidopa/levodopa just as a "trial" for 2 weeks and then it was stopped because it worked.  She was told that it was diagnostic of PD.  She was then started on the Mirapex ER 1.5 mg in 07/2009. and it was worked up to 3.0 mg.  She reports compulsive shopping (she overspends her "allowance).  She mostly internets shops.  She has always shopped but she has previously not gone over the "allowance."  She plays the lottery and plays more than previous.  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  She tried to get off the medication but it was unsuccessful because of worsening of PD.  She was placed on Azilect.  She has been on it for about 2 years.  She has not noted any benefits.    It was her impression that it made the mirapex last longer.  She feels strongly that the computer work and shopping became an issue because of the azilect.   04/19/13 update:  The patient presents today with her husband who supplements the history.  Once she discontinued the Azilect, she felt that her compulsive shopping got better.  Her husband feels that she is not exercising enough, but the patient estimates that she perhaps exercises 4 days per week, but not to the intensity that her husband would like.  She had one near fall since our last visit, but she was on a trail in [place] with 2 walking sticks and  one of them something to the ground and her husband caught her.  She is still taking clonazepam at night.  Her husband thinks that she takes it too early and falls asleep on the couch, but she denies this.  There have not been hallucinations.  There has not been nausea or vomiting.  She has been slower.   08/13/13 update:  The pt is f/u for PD.  She is accompanied by her husband who supplements the hx.  She is on mirapex 3 mg daily and carbidopa/levodopa 25/100 was added last visit.  She notices that she has more energy and can do more things, although her husband states that she does not necessarily do those things (including exercise). She noted that the feet "wobble" since being on the medication.  She also notices that her shopping "addiction" has returned.  She has had no falls.  No hallucinations.  No near syncope.  Neuroimaging has previously been performed.  It is available for my review today.  It was done in 2010.  There was mild small vessel disease.  The formal report is as follows:   Clinical Data: Tremors enhance and legs.  History of melanoma    MRI HEAD WITHOUT CONTRAST    Technique:  Multiplanar, multiecho pulse sequences of the brain and surrounding structures were obtained according to standard protocol without intravenous contrast.  Comparison: None.    Findings:    Gray-white matter differentiation is normal.  The sella is appropriate in signal and morphology.  Clival signal is intact. Cerebellar tonsils are at the level foramen magnum.  Odontoid process, the predental space and the prevertebral soft tissues are within normal limits.    Scattered lymph nodes are seen in the post triangle regions bilaterally, the largest being approximately 9 .1 mm on the left.    No abnormalities seen on diffusion weighted images.    A few scattered punctate foci of signal hyperintensity are seen in the subcortical white matter bifrontally, and to a lesser degree the biparietal  subcortical white matter. Discrete small T2 and FLAIR hyperintensities are seen in the biparietal subcortical white matter adjacent to the occipital horns.    The ventricles are normal.  Mastoids are clear.  Internal auditory canals are symmetrical in signal and morphology.  Flow voids are maintained in the major vessels at the cranial skull base.    Moderate inflammatory thickening of the mucosa in the ethmoids and the maxillary sinuses is seen.  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 No  blood breakdown products seen on SPGR sequences.    . IMPRESSION: 1.  No evidence of acute ischemia. 2.  Non specific subcortical white matter changes supratentorially .  These may reflect areas of ischemic gliosis related to small vessel disease due to hypertension and/or diabetes versus less likely a demyelinating process or vasculitis. 3.  Prominent perivascular spaces versus lacunes involving the parietal subcortical white matter. 4.  Inflammatory thickening of the mucosa in the ethmoids and the maxillary sinuses.    PREVIOUS MEDICATIONS: Mirapex and azilect; levodopa for a few weeks to "prove" benefit  ALLERGIES:   Allergies  Allergen Reactions  . Sporanox [Itraconazole]   . Tylenol [Acetaminophen]     CURRENT MEDICATIONS:  Current Outpatient Prescriptions on File Prior to Visit  Medication Sig Dispense Refill  . ALPRAZolam (XANAX) 0.25 MG tablet Take 0.25 mg by mouth 1 day or 1 dose.      Marland Kitchen aspirin 325 MG tablet Take 325 mg by mouth daily.      Marland Kitchen atorvastatin (LIPITOR) 10 MG tablet Take 10 mg by mouth daily.      . carbidopa-levodopa (SINEMET IR) 25-100 MG per tablet Take 1 tablet by mouth 3 (three) times daily.  90 tablet  5  . cholecalciferol (VITAMIN D) 1000 UNITS tablet Take 1,000 Units by mouth daily. Two daily      . clonazePAM (KLONOPIN) 1 MG tablet Take 1 tablet (1 mg  total) by mouth at bedtime.  30 tablet  4  . co-enzyme Q-10 30 MG capsule Take 30 mg by mouth daily. Taking 60mg       . CYMBALTA 60 MG capsule Take 1 capsule (60 mg total) by mouth daily.  90 capsule  3  . Green Tea 250 MG CAPS Take by mouth daily.      . hydrochlorothiazide (HYDRODIURIL) 25 MG tablet Take 25 mg by mouth daily.      . Magnesium 250 MG TABS Take by mouth 2 (two) times daily.      Marland Kitchen MIRAPEX ER 3 MG TB24 Take 1 tablet (3 mg total) by mouth daily.  90 tablet  3  . Multiple Vitamins-Minerals (ICAPS PO) Take by mouth.      . raloxifene (EVISTA) 60 MG tablet Take 60 mg  by mouth daily.       No current facility-administered medications on file prior to visit.    PAST MEDICAL HISTORY:   Past Medical History  Diagnosis Date  . Osteopenia   . Hypertension   . Parkinson disease   . Fibromyalgia   . Hypercholesteremia     controlled on medication  . Melanoma     PAST SURGICAL HISTORY:   Past Surgical History  Procedure Laterality Date  . Tonsillectomy    . Tubal ligation      SOCIAL HISTORY:   History   Social History  . Marital Status: Married    Spouse Name: N/A    Number of Children: N/A  . 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 Smoking status: Former Smoker    Quit date: 09/29/1997  . Smokeless tobacco: Never Used     Comment: chews Nicorette, 10-12/day  . Alcohol Use: Yes     Comment: Occ  . Drug Use: No  . Sexual Activity: Not on file   Other Topics Concern  . Not on file   Social History Narrative  . No narrative on file    FAMILY HISTORY:   Family Status  Relation Status Death Age  . Mother Deceased     colon CA  . Father Alive     healthy  . Brother Alive     healthy  . Sister Alive     4, healthy  . Child Alive     healthy    ROS:  Feels like walks on "rocks."  A complete 10 system review of systems was obtained and was unremarkable apart from  what is mentioned above.  PHYSICAL EXAMINATION:    VITALS:   Filed Vitals:   08/13/13 1325  BP: 148/90  Pulse: 70  Temp: 98 F (36.7 C)  Resp: 16  Height: 5\' 5"  (1.651 m)  Weight: 185 lb 4.8 oz (84.052 kg)    GEN:  The patient appears stated age and is in NAD. HEENT:  Normocephalic, atraumatic.  The mucous membranes are moist. The superficial temporal arteries are without ropiness or tenderness. CV:  RRR Lungs:  CTAB Neck/HEME:  There are no carotid bruits bilaterally.  Neurological examination:  Orientation: The patient is alert and oriented x3. Fund of knowledge is appropriate.  Recent and remote memory are intact.  Attention and concentration are normal.    Able to name objects and repeat phrases. Cranial nerves: There is good facial symmetry.  There is bilateral pseudoptosis from lid lag. Pupils are equal round and reactive to light bilaterally. Fundoscopic exam reveals clear margins bilaterally. Extraocular muscles are intact. The visual fields are full to confrontational testing. The speech is fluent and clear. Soft palate rises symmetrically and there is no tongue deviation. Hearing is intact to conversational tone. Sensation: Sensation is intact to light and pinprick throughout (facial, trunk, extremities). Pinprick is decreased in a distal fashion (stocking distribution).  Vibration is decreased distally. There is no extinction with double simultaneous stimulation. There is no sensory dermatomal level identified. Motor: Strength is 5/5 in the bilateral upper and lower extremities.   Shoulder shrug is equal and symmetric.  There is no pronator drift. Deep tendon reflexes: Deep tendon reflexes are 3/4 at the bilateral biceps, triceps, brachioradialis, patella and achilles. Plantar responses are downgoing bilaterally.  Movement examination: Tone: There is normal tone in the upper  and lower extremities today. Abnormal movements: There is L leg dyskinesia Coordination:  There is  good throughout movements bilaterally. Gait and Station: The patient has no difficulty arising out of a deep-seated chair without the use of the hands. The patient's stride length is normal with good arm swing bilaterally.  The patient has a negative pull test.      LABS:  Lab Results  Component Value Date   TSH 0.80 01/15/2013   Lab Results  Component Value Date   VITAMINB12 863 01/15/2013    SPEP/UPEP negative.    Lab Results  Component Value Date   FOLATE 22.1 01/15/2013     ASSESSMENT/PLAN:  1.  Parkinsonism.  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    -We discussed the diagnosis as well as pathophysiology of the disease.  We discussed treatment options as well as prognostic indicators.  Patient education was provided.  -I am concerned about the patient's Mirapex.  She is having compulsive behaviors.  We will decrease the Mirapex ER to 2.25 mg daily.  -I think that the levodopa has greatly benefited the patient, but unfortunately she does have some left leg dyskinesia that is bothersome.  We talked about our options, including adding amantadine, but right now she would like to try and cut the levodopa in half.  While I think she needs to take a half tablets 6 times per day, she is going to try and get away with half tablet 3 times per day.    -We again talked about the importance of exercise.   2.  probable peripheral neuropathy, idiopathic.  -She does have physical examination evidence of this.  Her laboratory workup has been unremarkable.  She is on Cymbalta, although that is primarily for fibromyalgia. 3.  history of melanoma.  -There is some old literature that suggests that the addition of levodopa can increase risk of return melanoma, but new literature suggests that this is not the etiology.  4.  history of tobacco abuse.  -While the patient has given up smoking, she is using quite a bit of Nicorette gum per day.  I talked to her  about the fact that she really needs to stop this as well.  She asked me if it was protective for Parkinson's, and I told her that while smoking may decrease her risk factor for Parkinson's, there is no evidence that using tobacco products slows Parkinson's one has the diagnosis. 5.  Insomnia.  -She is doing fine with clonazepam.  Risks, benefits, side effects and alternative therapies were discussed.  The opportunity to ask questions was given and they were answered to the best of my ability.  The patient expressed understanding and willingness to follow the outlined treatment protocols. 6.  Face to face time was 40 min

## 2013-08-14 ENCOUNTER — Ambulatory Visit: Payer: Federal, State, Local not specified - PPO | Admitting: Neurology

## 2013-08-15 ENCOUNTER — Ambulatory Visit: Payer: Federal, State, Local not specified - PPO | Admitting: Neurology

## 2013-09-12 DIAGNOSIS — J111 Influenza due to unidentified influenza virus with other respiratory manifestations: Secondary | ICD-10-CM | POA: Diagnosis not present

## 2013-09-12 DIAGNOSIS — B9789 Other viral agents as the cause of diseases classified elsewhere: Secondary | ICD-10-CM | POA: Diagnosis not present

## 2013-09-24 ENCOUNTER — Ambulatory Visit (INDEPENDENT_AMBULATORY_CARE_PROVIDER_SITE_OTHER): Payer: Medicare Other | Admitting: Neurology

## 2013-09-24 ENCOUNTER — Encounter: Payer: Self-pay | Admitting: Neurology

## 2013-09-24 VITALS — BP 132/78 | HR 68 | Resp 16 | Ht 65.0 in | Wt 176.2 lb

## 2013-09-24 DIAGNOSIS — G609 Hereditary and idiopathic neuropathy, unspecified: Secondary | ICD-10-CM | POA: Diagnosis not present

## 2013-09-24 DIAGNOSIS — G20 Parkinson's disease: Secondary | ICD-10-CM | POA: Diagnosis not present

## 2013-09-24 MED ORDER — PRAMIPEXOLE DIHYDROCHLORIDE ER 1.5 MG PO TB24: 1.5000 mg | ORAL_TABLET | Freq: Every day | ORAL | Status: DC

## 2013-09-24 NOTE — Progress Notes (Signed)
Morgan [name] was seen today in the movement disorders clinic for neurologic consultation at the request of Dr. [name] at [facility] internal medicine.  She is accompanied by her husband who helps to supplement the history.  She has previously seen Dr. neurology, both Dr. Krista Blue and Dr. Doy Mince.  I reviewed some of these notes.  The consultation is for the evaluation of Parkinson's disease.  The patient is a 63 y.o. right handed female reports that her first sx was "active dreaming."  However, the symptom that got her to the dr was L greater than R hand tremor, only when moving hands/drying hair.  This was in 2010.  She was started on carbidopa/levodopa just as a "trial" for 2 weeks and then it was stopped because it worked.  She was told that it was diagnostic of PD.  She was then started on the Mirapex ER 1.5 mg in 07/2009. and it was worked up to 3.0 mg.  She reports compulsive shopping (she overspends her "allowance).  She mostly internets shops.  She has always shopped but she has previously not gone over the "allowance."  She plays the lottery and plays more than previous.  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  She tried to get off the medication but it was unsuccessful because of worsening of PD.  She was placed on Azilect.  She has been on it for about 2 years.  She has not noted any benefits.    It was her impression that it made the mirapex last longer.  She feels strongly that the computer work and shopping became an issue because of the azilect.   04/19/13 update:  The patient presents today with her husband who supplements the history.  Once she discontinued the Azilect, she felt that her compulsive shopping got better.  Her husband feels that she is not exercising enough, but the patient estimates that she perhaps exercises 4 days per week, but not to the intensity that her husband would like.  She had one near fall since our last visit, but she was on a trail in [place] with 2 walking sticks and  one of them something to the ground and her husband caught her.  She is still taking clonazepam at night.  Her husband thinks that she takes it too early and falls asleep on the couch, but she denies this.  There have not been hallucinations.  There has not been nausea or vomiting.  She has been slower.   08/13/13 update:  The pt is f/u for PD.  She is accompanied by her husband who supplements the hx.  She is on mirapex 3 mg daily and carbidopa/levodopa 25/100 was added last visit.  She notices that she has more energy and can do more things, although her husband states that she does not necessarily do those things (including exercise). She noted that the feet "wobble" since being on the medication.  She also notices that her shopping "addiction" has returned.  She has had no falls.  No hallucinations.  No near syncope.  09/24/13 update:  The pt is f/u regarding PD.  She is accompanied by her husband who helps to supplement the history.  Last visit, I decreased her pramipexole to 2.25 mg daily secondary to compulsive shopping.  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  They  both agree that this could be pathologic.  She is now on carbidopa/levodopa 25/100, half a tablet 4 times per day.  Dyskinesia in the foot is better.  She is just getting over the flu.  No hallucinations.  She has not started exercising faithfully, primarily because she has not been feeling well.  No falls.    Neuroimaging has previously been performed.  It is available for my review today.  It was done in 2010.  There was mild small vessel disease.  The formal report is as follows:    MRI HEAD WITHOUT CONTRAST      Findings:    Gray-white matter differentiation is normal.  The sella is appropriate in signal and morphology.  Clival signal is intact. Cerebellar tonsils are at  the level foramen magnum.  Odontoid process, the predental space and the prevertebral soft tissues are within normal limits.    Scattered lymph nodes are seen in the post triangle regions bilaterally, the largest being approximately 9 .1 mm on the left.    No abnormalities seen on diffusion weighted images.    A few scattered punctate foci of signal hyperintensity are seen in the subcortical white matter bifrontally, and to a lesser degree the biparietal subcortical white matter. Discrete small T2 and FLAIR hyperintensities are seen in the biparietal subcortical white matter adjacent to the occipital horns.    The ventricles are normal.  Mastoids are clear.  Internal auditory canals are symmetrical in signal and morphology.  Flow voids are maintained in the major vessels at the cranial skull base.    Moderate inflammatory thickening of the mucosa in the ethmoids and the maxillary sinuses is seen.  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 No  blood breakdown products seen on SPGR sequences.    . IMPRESSION: 1.  No evidence of acute ischemia. 2.  Non specific subcortical white matter changes supratentorially .  These may reflect areas of ischemic gliosis related to small vessel disease due to hypertension and/or diabetes versus less likely a demyelinating process or vasculitis. 3.  Prominent perivascular spaces versus lacunes involving the parietal subcortical white matter. 4.  Inflammatory thickening of the mucosa in the ethmoids and the maxillary sinuses.    PREVIOUS MEDICATIONS: Mirapex and azilect; levodopa for a few weeks to "prove" benefit  ALLERGIES:   Allergies  Allergen Reactions  . Sporanox [Itraconazole]   . Tylenol [Acetaminophen]     CURRENT MEDICATIONS:  Current Outpatient Prescriptions on File Prior to Visit  Medication Sig Dispense Refill  . ALPRAZolam  (XANAX) 0.25 MG tablet Take 0.25 mg by mouth 1 day or 1 dose.      Marland Kitchen aspirin 325 MG tablet Take 325 mg by mouth daily.      Marland Kitchen atorvastatin (LIPITOR) 10 MG tablet Take 10 mg by mouth daily.      . cholecalciferol (VITAMIN D) 1000 UNITS tablet Take 1,000 Units by mouth daily. Two daily      . clonazePAM (KLONOPIN) 1 MG tablet Take 1 tablet (1 mg total) by mouth at bedtime.  30 tablet  4  . co-enzyme Q-10 30 MG capsule Take 30 mg by mouth daily. Taking 60mg       . CYMBALTA 60 MG capsule Take 1 capsule (60 mg total) by mouth daily.  90 capsule  3  . Green Tea 250 MG CAPS Take by mouth daily.      Marland Kitchen  hydrochlorothiazide (HYDRODIURIL) 25 MG tablet Take 25 mg by mouth daily.      . Magnesium 250 MG TABS Take by mouth 2 (two) times daily.      . Multiple Vitamins-Minerals (ICAPS PO) Take by mouth.      . Pramipexole Dihydrochloride 2.25 MG TB24 Take 1 tablet by mouth daily.  90 tablet  3  . raloxifene (EVISTA) 60 MG tablet Take 60 mg by mouth daily.       No current facility-administered medications on file prior to visit.    PAST MEDICAL HISTORY:   Past Medical History  Diagnosis Date  . Osteopenia   . Hypertension   . Parkinson disease   . Fibromyalgia   . Hypercholesteremia     controlled on medication  . Melanoma     PAST SURGICAL HISTORY:   Past Surgical History  Procedure Laterality Date  . Tonsillectomy    . Tubal ligation      SOCIAL HISTORY:   History   Social History  . Marital Status: Married    Spouse Name: N/A    Number of Children: N/A  . 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 Smoking status: Former Smoker    Quit date: 09/29/1997  . Smokeless tobacco: Never Used     Comment: chews Nicorette, 10-12/day  . Alcohol Use: Yes     Comment: Occ  . Drug Use: No  . Sexual Activity: Not on file   Other Topics Concern  . Not on file   Social History Narrative  . No narrative on  file    FAMILY HISTORY:   Family Status  Relation Status Death Age  . Mother Deceased     colon CA  . Father Alive     healthy  . Brother Alive     healthy  . Sister Alive     4, healthy  . Child Alive     healthy    ROS:  Feels like walks on "rocks."  A complete 10 system review of systems was obtained and was unremarkable apart from what is mentioned above.  PHYSICAL EXAMINATION:    VITALS:   Filed Vitals:   09/24/13 1359  BP: 132/78  Pulse: 68  Resp: 16  Height: 5\' 5"  (1.651 m)  Weight: 176 lb 4 oz (79.946 kg)    GEN:  The patient appears stated age and is in NAD. HEENT:  Normocephalic, atraumatic.  The mucous membranes are moist. The superficial temporal arteries are without ropiness or tenderness. CV:  RRR Lungs:  CTAB Neck/HEME:  There are no carotid bruits bilaterally.  Neurological examination:  Orientation: The patient is alert and oriented x3. Fund of knowledge is appropriate.  Recent and remote memory are intact.  Attention and concentration are normal.    Able to name objects and repeat phrases.  Movement examination: Tone: There is normal tone in the upper and lower extremities today. Abnormal movements: There is no dyskinesia or tremor today. Coordination:  There is good throughout movements bilaterally. Gait and Station: The patient has no difficulty arising out of a deep-seated chair without the use of the hands. The patient's stride length is normal with good arm swing bilaterally.  The patient has a negative pull test.      LABS:  Lab Results  Component Value Date   TSH 0.80 01/15/2013   Lab Results  Component Value  Date   [id number] 505 01/15/2013    SPEP/UPEP negative.    Lab Results  Component Value Date   FOLATE 22.1 01/15/2013     ASSESSMENT/PLAN:  1.  Parkinsonism.  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    -We discussed the diagnosis as well as pathophysiology of the disease.  We  discussed treatment options as well as prognostic indicators.  Patient education was provided.  -I am concerned about the patient's Mirapex.  She is having compulsive behaviors.  We will decrease the Mirapex ER to 1.5 mg daily.  -I think that the levodopa has greatly benefited the patient, but unfortunately she had dyskinesia even at small dosages.  She is now at carbidopa/levodopa 25/100, half tablet 4 times per day and is really doing very well and free of dyskinesia.  We will have to see if we may need to increase this as we decreased the Mirapex.  We talked about Rytary today.  It is not currently available, although it has been FDA approved.  It should be to the market soon.  -We again talked about the importance of exercise.   2.  probable peripheral neuropathy, idiopathic.  -She does have physical examination evidence of this.  Her laboratory workup has been unremarkable.  She is on Cymbalta, although that is primarily for fibromyalgia. 3.  history of melanoma.  -There is some old literature that suggests that the addition of levodopa can increase risk of return melanoma, but new literature suggests that this is not the etiology.  4.  history of tobacco abuse.  -While the patient has given up smoking, she is using quite a bit of Nicorette gum per day.  I talked to her about the fact that she really needs to stop this as well.  She asked me if it was protective for Parkinson's, and I told her that while smoking may decrease her risk factor for Parkinson's, there is no evidence that using tobacco products slows Parkinson's one has the diagnosis. 5.  Insomnia.  -She is doing fine with clonazepam.  Risks, benefits, side effects and alternative therapies were discussed.  The opportunity to ask questions was given and they were answered to the best of my ability.  The patient expressed understanding and willingness to follow the outlined treatment protocols. 6.  Return in about 3 months (around  12/23/2013) for end of day appt.

## 2013-09-26 DIAGNOSIS — Z23 Encounter for immunization: Secondary | ICD-10-CM | POA: Diagnosis not present

## 2013-09-30 ENCOUNTER — Other Ambulatory Visit: Payer: Self-pay | Admitting: Neurology

## 2013-09-30 NOTE — Telephone Encounter (Signed)
Refill with 5 refills

## 2013-09-30 NOTE — Telephone Encounter (Signed)
Refill called to [facility].

## 2013-09-30 NOTE — Telephone Encounter (Signed)
Please advise on refill since Clonazepam.

## 2013-10-08 ENCOUNTER — Other Ambulatory Visit: Payer: Self-pay | Admitting: Neurology

## 2013-10-08 NOTE — Telephone Encounter (Signed)
Carbidopa Levodopa RX request received. Per last office note patient to stay on half tablet, 4 times daily. RX changed to reflex this and #120 (90 day supply) approved with two refills.

## 2013-10-22 ENCOUNTER — Telehealth: Payer: Self-pay | Admitting: Neurology

## 2013-10-22 NOTE — Telephone Encounter (Signed)
Pt called, states her Mirapex was reduced to 1.5mg . She would like to consider an increase to 2.5mg . She uses [facility] order. Pt CB# [phone] / Sherri S.

## 2013-10-22 NOTE — Telephone Encounter (Signed)
Patient want to increase mirapex 1.5mg  to 2.5 mg  Please advise

## 2013-10-22 NOTE — Telephone Encounter (Signed)
I do not recommend this.  She had major side effects with higher dosages.

## 2013-11-21 ENCOUNTER — Ambulatory Visit: Payer: Federal, State, Local not specified - PPO | Admitting: Occupational Therapy

## 2013-11-21 ENCOUNTER — Ambulatory Visit: Payer: Federal, State, Local not specified - PPO | Attending: Neurology | Admitting: Physical Therapy

## 2013-11-21 ENCOUNTER — Other Ambulatory Visit: Payer: Self-pay | Admitting: Neurology

## 2013-11-21 DIAGNOSIS — G20 Parkinson's disease: Secondary | ICD-10-CM

## 2013-11-21 DIAGNOSIS — IMO0001 Reserved for inherently not codable concepts without codable children: Secondary | ICD-10-CM | POA: Insufficient documentation

## 2013-11-21 DIAGNOSIS — G20A1 Parkinson's disease without dyskinesia, without mention of fluctuations: Secondary | ICD-10-CM | POA: Insufficient documentation

## 2013-11-21 DIAGNOSIS — R269 Unspecified abnormalities of gait and mobility: Secondary | ICD-10-CM | POA: Insufficient documentation

## 2013-12-24 ENCOUNTER — Ambulatory Visit (INDEPENDENT_AMBULATORY_CARE_PROVIDER_SITE_OTHER): Payer: Medicare Other | Admitting: Neurology

## 2013-12-24 ENCOUNTER — Encounter: Payer: Self-pay | Admitting: Neurology

## 2013-12-24 ENCOUNTER — Telehealth: Payer: Self-pay | Admitting: Neurology

## 2013-12-24 VITALS — BP 138/68 | HR 92 | Resp 18 | Ht 65.0 in | Wt 186.0 lb

## 2013-12-24 DIAGNOSIS — G609 Hereditary and idiopathic neuropathy, unspecified: Secondary | ICD-10-CM | POA: Diagnosis not present

## 2013-12-24 DIAGNOSIS — G20 Parkinson's disease: Secondary | ICD-10-CM | POA: Diagnosis not present

## 2013-12-24 MED ORDER — CYMBALTA 60 MG PO CPEP: 60.0000 mg | ORAL_CAPSULE | Freq: Every day | ORAL | Status: DC

## 2013-12-24 MED ORDER — CARBIDOPA-LEVODOPA ER 36.25-145 MG PO CPCR: 1.0000 | ORAL_CAPSULE | Freq: Three times a day (TID) | ORAL | Status: DC

## 2013-12-24 MED ORDER — CLONAZEPAM 1 MG PO TABS: 1.0000 mg | ORAL_TABLET | Freq: Every day | ORAL | Status: DC

## 2013-12-24 NOTE — Telephone Encounter (Signed)
Clonazepam RX [facility]ed to [facility] at [phone] with confirmation received.

## 2013-12-24 NOTE — Patient Instructions (Signed)
1. Take your Rytary 36.25mg /145mg  one tablet by mouth three times daily.  2. Let us know how you are doing in a week.  3. Follow up in 3-4 weeks.

## 2013-12-24 NOTE — Progress Notes (Signed)
Morgan [name] was seen today in the movement disorders clinic for neurologic consultation at the request of Dr. [name] at [facility] internal medicine.  She is accompanied by her husband who helps to supplement the history.  She has previously seen Dr. neurology, both Dr. Krista Blue and Dr. Doy Mince.  I reviewed some of these notes.  The consultation is for the evaluation of Parkinson's disease.  The patient is a 63 y.o. right handed female reports that her first sx was "active dreaming."  However, the symptom that got her to the dr was L greater than R hand tremor, only when moving hands/drying hair.  This was in 2010.  She was started on carbidopa/levodopa just as a "trial" for 2 weeks and then it was stopped because it worked.  She was told that it was diagnostic of PD.  She was then started on the Mirapex ER 1.5 mg in 07/2009. and it was worked up to 3.0 mg.  She reports compulsive shopping (she overspends her "allowance).  She mostly internets shops.  She has always shopped but she has previously not gone over the "allowance."  She plays the lottery and plays more than previous.  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  She tried to get off the medication but it was unsuccessful because of worsening of PD.  She was placed on Azilect.  She has been on it for about 2 years.  She has not noted any benefits.    It was her impression that it made the mirapex last longer.  She feels strongly that the computer work and shopping became an issue because of the azilect.   04/19/13 update:  The patient presents today with her husband who supplements the history.  Once she discontinued the Azilect, she felt that her compulsive shopping got better.  Her husband feels that she is not exercising enough, but the patient estimates that she perhaps exercises 4 days per week, but not to the intensity that her husband would like.  She had one near fall since our last visit, but she was on a trail in [place] with 2 walking sticks and  one of them something to the ground and her husband caught her.  She is still taking clonazepam at night.  Her husband thinks that she takes it too early and falls asleep on the couch, but she denies this.  There have not been hallucinations.  There has not been nausea or vomiting.  She has been slower.   08/13/13 update:  The pt is f/u for PD.  She is accompanied by her husband who supplements the hx.  She is on mirapex 3 mg daily and carbidopa/levodopa 25/100 was added last visit.  She notices that she has more energy and can do more things, although her husband states that she does not necessarily do those things (including exercise). She noted that the feet "wobble" since being on the medication.  She also notices that her shopping "addiction" has returned.  She has had no falls.  No hallucinations.  No near syncope.  09/24/13 update:  The pt is f/u regarding PD.  She is accompanied by her husband who helps to supplement the history.  Last visit, I decreased her pramipexole to 2.25 mg daily secondary to compulsive shopping.  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  They  both agree that this could be pathologic.  She is now on carbidopa/levodopa 25/100, half a tablet 4 times per day.  Dyskinesia in the foot is better.  She is just getting over the flu.  No hallucinations.  She has not started exercising faithfully, primarily because she has not been feeling well.  No falls.    12/24/13 update:  The patient is following up in regards to her Parkinson's disease.  She is accompanied by her husband who helps to supplement the history.  Her Mirapex has been reduced to 1.5 mg daily secondary to compulsive behaviors.  She did call and ask if she could increase it, but I did not want to do that.  She is still having compulsive behaviors (shopping) and her  husband would like her to discontinue the medication altogether.  She is currently on carbidopa/levodopa 25/100, half a tablet 4 times per day.  She takes at approximately 8 AM, noon, 4 PM and 9 PM.  Unfortunately, the patient had dyskinesia at higher dosages.  Nonetheless, she states that she would rather have the dyskinesia than feel like she currently does.  She feels slow and achy and just does not feel like doing anything.  She was not having any hallucinations.  She does state that she signed up for a 5K in January and would like to start training for that.  She is not having any hallucinations.  No falls.  She is still on Cymbalta, 60 mg daily as well as clonazepam for insomnia.  Neuroimaging has previously been performed.  It is available for my review today.  It was done in 2010.  There was mild small vessel disease.  The formal report is as follows:    MRI HEAD WITHOUT CONTRAST      Findings:    Gray-white matter differentiation is normal.  The sella is appropriate in signal and morphology.  Clival signal is intact. Cerebellar tonsils are at the level foramen magnum.  Odontoid process, the predental space and the prevertebral soft tissues are within normal limits.    Scattered lymph nodes are seen in the post triangle regions bilaterally, the largest being approximately 9 .1 mm on the left.    No abnormalities seen on diffusion weighted images.    A few scattered punctate foci of signal hyperintensity are seen in the subcortical white matter bifrontally, and to a lesser degree the biparietal subcortical white matter. Discrete small T2 and FLAIR hyperintensities are seen in the biparietal subcortical white matter adjacent to the occipital horns.    The ventricles are normal.  Mastoids are clear.  Internal auditory canals are symmetrical in signal and morphology.  Flow voids are maintained in the major vessels at the cranial skull base.    Moderate inflammatory thickening of the  mucosa in the ethmoids and the maxillary sinuses is seen.  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 No  blood breakdown products seen on SPGR sequences.    . IMPRESSION: 1.  No evidence of acute ischemia. 2.  Non specific subcortical white matter changes supratentorially .  These may reflect areas of ischemic gliosis related to small vessel disease due to hypertension and/or diabetes versus less likely a demyelinating process or vasculitis. 3.  Prominent perivascular spaces versus lacunes involving the parietal subcortical white matter. 4.  Inflammatory thickening of the mucosa in the ethmoids and the maxillary sinuses.    PREVIOUS  MEDICATIONS: Mirapex and azilect; levodopa for a few weeks to "prove" benefit  ALLERGIES:   Allergies  Allergen Reactions  . Sporanox [Itraconazole]   . Tylenol [Acetaminophen]     CURRENT MEDICATIONS:  Current Outpatient Prescriptions on File Prior to Visit  Medication Sig Dispense Refill  . ALPRAZolam (XANAX) 0.25 MG tablet Take 0.25 mg by mouth 1 day or 1 dose.      Marland Kitchen aspirin 325 MG tablet Take 325 mg by mouth daily.      Marland Kitchen atorvastatin (LIPITOR) 10 MG tablet Take 10 mg by mouth daily.      . cholecalciferol (VITAMIN D) 1000 UNITS tablet Take 1,000 Units by mouth daily. Two daily      . co-enzyme Q-10 30 MG capsule Take 30 mg by mouth daily. Taking 60mg       . Green Tea 250 MG CAPS Take by mouth daily.      . hydrochlorothiazide (HYDRODIURIL) 25 MG tablet Take 25 mg by mouth daily.      . Magnesium 250 MG TABS Take by mouth 2 (two) times daily.      . Multiple Vitamins-Minerals (ICAPS PO) Take by mouth.      . Pramipexole Dihydrochloride (MIRAPEX ER) 1.5 MG TB24 Take 1 tablet (1.5 mg total) by mouth daily.  90 tablet  3  . raloxifene (EVISTA) 60 MG tablet Take 60 mg by mouth daily.       No current facility-administered medications on file prior  to visit.    PAST MEDICAL HISTORY:   Past Medical History  Diagnosis Date  . Osteopenia   . Hypertension   . Parkinson disease   . Fibromyalgia   . Hypercholesteremia     controlled on medication  . Melanoma     PAST SURGICAL HISTORY:   Past Surgical History  Procedure Laterality Date  . Tonsillectomy    . Tubal ligation      SOCIAL HISTORY:   History   Social History  . Marital Status: Married    Spouse Name: N/A    Number of Children: N/A  . 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 Smoking status: Former Smoker    Quit date: 09/29/1997  . Smokeless tobacco: Never Used     Comment: chews Nicorette, 10-12/day  . Alcohol Use: Yes     Comment: Occ  . Drug Use: No  . Sexual Activity: Not on file   Other Topics Concern  . Not on file   Social History Narrative  . No narrative on file    FAMILY HISTORY:   Family Status  Relation Status Death Age  . Mother Deceased     colon CA  . Father Alive     healthy  . Brother Alive     healthy  . Sister Alive     4, healthy  . Child Alive     healthy    ROS:  A complete 10 system review of systems was obtained and was unremarkable apart from what is mentioned above.  PHYSICAL EXAMINATION:    VITALS:   Filed Vitals:   12/24/13 1502  BP: 138/68  Pulse: 92  Resp: 18  Height: 5\' 5"  (1.651 m)  Weight: 186 lb (84.369 kg)    GEN:  The patient appears stated age and is in NAD. HEENT:  Normocephalic, atraumatic.  The mucous membranes are moist. The superficial temporal  arteries are without ropiness or tenderness. CV:  RRR Lungs:  CTAB Neck/HEME:  There are no carotid bruits bilaterally.  Neurological examination:  Orientation: The patient is alert and oriented x3. Fund of knowledge is appropriate.  Recent and remote memory are intact.  Attention and concentration are normal.    Able to name objects and repeat  phrases.  Movement examination: Tone: There is moderate increased tone in the left upper extremity. Abnormal movements: None.  No dyskinesia or tremor. Coordination:  There is decreased hand opening and closing and decreased finger taps on the left. Gait and Station: The patient has no difficulty arising out of a deep-seated chair without the use of the hands. The patient's stride length is decreased today.  She has a negative pull test.  LABS:  Lab Results  Component Value Date   TSH 0.80 01/15/2013   Lab Results  Component Value Date   [id number] 993 01/15/2013    SPEP/UPEP negative.    Lab Results  Component Value Date   FOLATE 22.1 01/15/2013     ASSESSMENT/PLAN:  1.  Parkinsonism.  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    -We discussed the diagnosis as well as pathophysiology of the disease.  We discussed treatment options as well as prognostic indicators.  Patient education was provided.  -I am concerned about the patient's Mirapex.  She is having compulsive behaviors.  I do plan on continuing to wean the Mirapex, but did not want to make too many changes today.  She will remain on the Mirapex, 1.5 mg daily.  -After a long discussion, we decided to try Rytary, 145 mg 3 times a day.  Risks, benefits, side effects and alternative therapies were discussed.  The opportunity to ask questions was given and they were answered to the best of my ability.  The patient expressed understanding and willingness to follow the outlined treatment protocols.  -We again talked about the importance of exercise.   2.  probable peripheral neuropathy, idiopathic.  -She does have physical examination evidence of this.  Her laboratory workup has been unremarkable.  She is on Cymbalta, although that is primarily for fibromyalgia. 3.  history of melanoma.  -There is some old literature that suggests that the addition of levodopa can increase risk of return  melanoma, but new literature suggests that this is not the etiology.  4.  history of tobacco abuse.  -While the patient has given up smoking, she is using quite a bit of Nicorette gum per day.  I talked to her about the fact that she really needs to stop this as well.  She asked me if it was protective for Parkinson's, and I told her that while smoking may decrease her risk factor for Parkinson's, there is no evidence that using tobacco products slows Parkinson's one has the diagnosis. 5.  Insomnia.  -She is doing fine with clonazepam.  Risks, benefits, side effects and alternative therapies were discussed.  The opportunity to ask questions was given and they were answered to the best of my ability.  The patient expressed understanding and willingness to follow the outlined treatment protocols.

## 2013-12-31 ENCOUNTER — Telehealth: Payer: Self-pay | Admitting: Neurology

## 2013-12-31 MED ORDER — CARBIDOPA-LEVODOPA ER 36.25-145 MG PO CPCR: 1.0000 | ORAL_CAPSULE | Freq: Three times a day (TID) | ORAL | Status: DC

## 2013-12-31 NOTE — Telephone Encounter (Signed)
Pt called/returning your call at 12:41PM

## 2013-12-31 NOTE — Telephone Encounter (Signed)
Spoke with patient about Rytary. She tried to space out her doses through the day and was having some off time between doses. She has them spaced out about 4.5 hours and it is working as well as Levodopa IR. She is sleeping well with help of Klonopin and has some movements in feet but no tapping. Spoke with Dr [name] and she would like her to stay on the dose until her follow up appt. Patient made aware and more samples at the front for pick up.

## 2013-12-31 NOTE — Telephone Encounter (Signed)
Left message on machine for patient to call back. To check to see how she is doing on Rytary.

## 2014-01-07 ENCOUNTER — Ambulatory Visit: Payer: Medicare Other | Attending: Neurology | Admitting: Physical Therapy

## 2014-01-07 ENCOUNTER — Ambulatory Visit: Payer: Medicare Other

## 2014-01-07 DIAGNOSIS — G20A1 Parkinson's disease without dyskinesia, without mention of fluctuations: Secondary | ICD-10-CM | POA: Insufficient documentation

## 2014-01-07 DIAGNOSIS — G20 Parkinson's disease: Secondary | ICD-10-CM | POA: Diagnosis not present

## 2014-01-07 DIAGNOSIS — R269 Unspecified abnormalities of gait and mobility: Secondary | ICD-10-CM | POA: Diagnosis not present

## 2014-01-07 DIAGNOSIS — IMO0001 Reserved for inherently not codable concepts without codable children: Secondary | ICD-10-CM | POA: Insufficient documentation

## 2014-01-14 ENCOUNTER — Ambulatory Visit: Payer: Medicare Other

## 2014-01-16 ENCOUNTER — Encounter: Payer: Self-pay | Admitting: Neurology

## 2014-01-16 ENCOUNTER — Ambulatory Visit (INDEPENDENT_AMBULATORY_CARE_PROVIDER_SITE_OTHER): Payer: Medicare Other | Admitting: Neurology

## 2014-01-16 VITALS — BP 142/74 | HR 72 | Resp 18 | Ht 65.0 in | Wt 187.0 lb

## 2014-01-16 DIAGNOSIS — E7800 Pure hypercholesterolemia, unspecified: Secondary | ICD-10-CM | POA: Diagnosis not present

## 2014-01-16 DIAGNOSIS — G20 Parkinson's disease: Secondary | ICD-10-CM

## 2014-01-16 DIAGNOSIS — M797 Fibromyalgia: Secondary | ICD-10-CM

## 2014-01-16 DIAGNOSIS — I10 Essential (primary) hypertension: Secondary | ICD-10-CM | POA: Diagnosis not present

## 2014-01-16 DIAGNOSIS — IMO0001 Reserved for inherently not codable concepts without codable children: Secondary | ICD-10-CM | POA: Diagnosis not present

## 2014-01-16 DIAGNOSIS — F411 Generalized anxiety disorder: Secondary | ICD-10-CM | POA: Diagnosis not present

## 2014-01-16 MED ORDER — PRAMIPEXOLE DIHYDROCHLORIDE ER 0.75 MG PO TB24: 1.0000 | ORAL_TABLET | Freq: Every day | ORAL | Status: DC

## 2014-01-16 NOTE — Progress Notes (Signed)
Morgan [name] was seen today in the movement disorders clinic for neurologic consultation at the request of Dr. [name] at [facility] internal medicine.  She is accompanied by her husband who helps to supplement the history.  She has previously seen Dr. neurology, both Dr. Krista Blue and Dr. Doy Mince.  I reviewed some of these notes.  The consultation is for the evaluation of Parkinson's disease.  The patient is a 63 y.o. right handed female reports that her first sx was "active dreaming."  However, the symptom that got her to the dr was L greater than R hand tremor, only when moving hands/drying hair.  This was in 2010.  She was started on carbidopa/levodopa just as a "trial" for 2 weeks and then it was stopped because it worked.  She was told that it was diagnostic of PD.  She was then started on the Mirapex ER 1.5 mg in 07/2009. and it was worked up to 3.0 mg.  She reports compulsive shopping (she overspends her "allowance).  She mostly internets shops.  She has always shopped but she has previously not gone over the "allowance."  She plays the lottery and plays more than previous.  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  She tried to get off the medication but it was unsuccessful because of worsening of PD.  She was placed on Azilect.  She has been on it for about 2 years.  She has not noted any benefits.    It was her impression that it made the mirapex last longer.  She feels strongly that the computer work and shopping became an issue because of the azilect.   04/19/13 update:  The patient presents today with her husband who supplements the history.  Once she discontinued the Azilect, she felt that her compulsive shopping got better.  Her husband feels that she is not exercising enough, but the patient estimates that she perhaps exercises 4 days per week, but not to the intensity that her husband would like.  She had one near fall since our last visit, but she was on a trail in [place] with 2 walking sticks and  one of them something to the ground and her husband caught her.  She is still taking clonazepam at night.  Her husband thinks that she takes it too early and falls asleep on the couch, but she denies this.  There have not been hallucinations.  There has not been nausea or vomiting.  She has been slower.   08/13/13 update:  The pt is f/u for PD.  She is accompanied by her husband who supplements the hx.  She is on mirapex 3 mg daily and carbidopa/levodopa 25/100 was added last visit.  She notices that she has more energy and can do more things, although her husband states that she does not necessarily do those things (including exercise). She noted that the feet "wobble" since being on the medication.  She also notices that her shopping "addiction" has returned.  She has had no falls.  No hallucinations.  No near syncope.  09/24/13 update:  The pt is f/u regarding PD.  She is accompanied by her husband who helps to supplement the history.  Last visit, I decreased her pramipexole to 2.25 mg daily secondary to compulsive shopping.  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  They  both agree that this could be pathologic.  She is now on carbidopa/levodopa 25/100, half a tablet 4 times per day.  Dyskinesia in the foot is better.  She is just getting over the flu.  No hallucinations.  She has not started exercising faithfully, primarily because she has not been feeling well.  No falls.    12/24/13 update:  The patient is following up in regards to her Parkinson's disease.  She is accompanied by her husband who helps to supplement the history.  Her Mirapex has been reduced to 1.5 mg daily secondary to compulsive behaviors.  She did call and ask if she could increase it, but I did not want to do that.  She is still having compulsive behaviors (shopping) and her  husband would like her to discontinue the medication altogether.  She is currently on carbidopa/levodopa 25/100, half a tablet 4 times per day.  She takes at approximately 8 AM, noon, 4 PM and 9 PM.  Unfortunately, the patient had dyskinesia at higher dosages.  Nonetheless, she states that she would rather have the dyskinesia than feel like she currently does.  She feels slow and achy and just does not feel like doing anything.  She was not having any hallucinations.  She does state that she signed up for a 5K in January and would like to start training for that.  She is not having any hallucinations.  No falls.  She is still on Cymbalta, 60 mg daily as well as clonazepam for insomnia.  01/16/14 update:  The patient is accompanied by her husband who helps to supplement the history.  Last visit, we started the patient on Rytary, and she is currently on 145 mg 3 times a day.  Unfortunately, the patient reports that she had insomnia on the medication and felt "woozy" and really overall felt better on the carbidopa/levodopa IR and would like to go back to that medication, even though she dyskinesia on low dose.  She asks me if she can wean the Mirapex and restart the levodopa.  [facility].  She is planning on working up to running a 5K at [employer] in January, 2016.  She remains on the Cymbalta for her fibromyalgia.  Neuroimaging has previously been performed.  It is available for my review today.  It was done in 2010.  There was mild small vessel disease.  The formal report is as follows:    MRI HEAD WITHOUT CONTRAST      Findings:    Gray-white matter differentiation is normal.  The sella is appropriate in signal and morphology.  Clival signal is intact. Cerebellar tonsils are at the level foramen magnum.  Odontoid process, the predental space and the prevertebral soft tissues are within normal limits.    Scattered lymph nodes  are seen in the post triangle regions bilaterally, the largest being approximately 9 .1 mm on the left.    No abnormalities seen on diffusion weighted images.    A few scattered punctate foci of signal hyperintensity are seen in the subcortical white matter bifrontally, and to a lesser degree the biparietal subcortical white matter. Discrete small T2 and FLAIR hyperintensities are seen in the biparietal subcortical white matter adjacent to the occipital horns.    The ventricles are normal.  Mastoids are clear.  Internal auditory canals are symmetrical in signal and morphology.  Flow voids are maintained in the major vessels at the  cranial skull base.    Moderate inflammatory thickening of the mucosa in the ethmoids and the maxillary sinuses is seen.  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 No  blood breakdown products seen on SPGR sequences.    . IMPRESSION: 1.  No evidence of acute ischemia. 2.  Non specific subcortical white matter changes supratentorially .  These may reflect areas of ischemic gliosis related to small vessel disease due to hypertension and/or diabetes versus less likely a demyelinating process or vasculitis. 3.  Prominent perivascular spaces versus lacunes involving the parietal subcortical white matter. 4.  Inflammatory thickening of the mucosa in the ethmoids and the maxillary sinuses.    PREVIOUS MEDICATIONS: Mirapex and azilect; levodopa for a few weeks to "prove" benefit  ALLERGIES:   Allergies  Allergen Reactions  . Sporanox [Itraconazole]   . Tylenol [Acetaminophen]     CURRENT MEDICATIONS:  Current Outpatient Prescriptions on File Prior to Visit  Medication Sig Dispense Refill  . ALPRAZolam (XANAX) 0.25 MG tablet Take 0.25 mg by mouth 1 day or 1 dose.      Marland Kitchen aspirin 325 MG tablet Take 325 mg by mouth daily.      Marland Kitchen atorvastatin (LIPITOR) 10 MG  tablet Take 10 mg by mouth daily.      . Carbidopa-Levodopa ER (RYTARY) 36.25-145 MG CPCR Take 1 tablet by mouth 3 (three) times daily.  50 capsule  0  . cholecalciferol (VITAMIN D) 1000 UNITS tablet Take 1,000 Units by mouth daily. Two daily      . clonazePAM (KLONOPIN) 1 MG tablet Take 1 tablet (1 mg total) by mouth at bedtime.  90 tablet  1  . co-enzyme Q-10 30 MG capsule Take 30 mg by mouth daily. Taking 60mg       . CYMBALTA 60 MG capsule Take 1 capsule (60 mg total) by mouth daily.  90 capsule  3  . Green Tea 250 MG CAPS Take by mouth daily.      . hydrochlorothiazide (HYDRODIURIL) 25 MG tablet Take 25 mg by mouth daily.      . Magnesium 250 MG TABS Take by mouth 2 (two) times daily.      . Multiple Vitamins-Minerals (ICAPS PO) Take by mouth.      . raloxifene (EVISTA) 60 MG tablet Take 60 mg by mouth daily.       No current facility-administered medications on file prior to visit.    PAST MEDICAL HISTORY:   Past Medical History  Diagnosis Date  . Osteopenia   . Hypertension   . Parkinson disease   . Fibromyalgia   . Hypercholesteremia     controlled on medication  . Melanoma     PAST SURGICAL HISTORY:   Past Surgical History  Procedure Laterality Date  . Tonsillectomy    . Tubal ligation      SOCIAL HISTORY:   History   Social History  . Marital Status: Married    Spouse Name: N/A    Number of Children: N/A  . 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 Smoking status: Former Smoker    Quit date: 09/29/1997  . Smokeless tobacco: Never Used     Comment: chews Nicorette, 10-12/day  . Alcohol Use: Yes     Comment: Occ  . Drug Use: No  .  Sexual Activity: Not on file   Other Topics Concern  . Not on file   Social History Narrative  . No narrative on file    FAMILY HISTORY:   Family Status  Relation Status Death Age  . Mother Deceased     colon CA  . Father Alive      healthy  . Brother Alive     healthy  . Sister Alive     4, healthy  . Child Alive     healthy    ROS:  A complete 10 system review of systems was obtained and was unremarkable apart from what is mentioned above.  PHYSICAL EXAMINATION:    VITALS:   Filed Vitals:   01/16/14 1500  BP: 142/74  Pulse: 72  Resp: 18  Height: 5\' 5"  (1.651 m)  Weight: 187 lb (84.823 kg)    GEN:  The patient appears stated age and is in NAD. HEENT:  Normocephalic, atraumatic.  The mucous membranes are moist. The superficial temporal arteries are without ropiness or tenderness. CV:  RRR Lungs:  CTAB Neck/HEME:  There are no carotid bruits bilaterally.  Neurological examination:  Orientation: The patient is alert and oriented x3. Fund of knowledge is appropriate.  Recent and remote memory are intact.  Attention and concentration are normal.    Able to name objects and repeat phrases.  Movement examination: Tone: There is mild increased tone in the left upper extremity. Abnormal movements: None.  No dyskinesia or tremor. Coordination:  There is decreased hand opening and closing and decreased finger taps on the left. Gait and Station: The patient has no difficulty arising out of a deep-seated chair without the use of the hands. The patient's stride length is better today.  She has a negative pull test.  LABS:  Lab Results  Component Value Date   TSH 0.80 01/15/2013   Lab Results  Component Value Date   ATFTDDUK02 542 01/15/2013    SPEP/UPEP negative.    Lab Results  Component Value Date   FOLATE 22.1 01/15/2013     ASSESSMENT/PLAN:  1.  Parkinsonism.  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    -We discussed the diagnosis as well as pathophysiology of the disease.  We discussed treatment options as well as prognostic indicators.  Patient education was provided.  -I am concerned about the patient's Mirapex.  She is having compulsive behaviors.   The Mirapex will be decreased to 0.75 mg ER daily.  -Although I think that she likely just need more Rytary, the patient really just wanted to go back to carbidopa/levodopa IR, and I have no objection to that, as long she knows that she likely will once again have dyskinesia on low dosage like she did previously.  She understands that.  She will restart carbidopa/levodopa 25/100 IR and take this 3 times per day. Risks, benefits, side effects and alternative therapies were discussed.  The opportunity to ask questions was given and they were answered to the best of my ability.  The patient expressed understanding and willingness to follow the outlined treatment protocols.  -We again talked about the importance of exercise.  I signed a form so [facility] could [facility] 2.  probable peripheral neuropathy, idiopathic.  -She does have physical examination evidence of this.  Her laboratory workup has been unremarkable.  She is on Cymbalta, although that is primarily for fibromyalgia. 3.  history of melanoma.  -There  is some old literature that suggests that the addition of levodopa can increase risk of return melanoma, but new literature suggests that this is not the etiology.  4.  history of tobacco abuse.  -While the patient has given up smoking, she is using quite a bit of Nicorette gum per day.  I talked to her about the fact that she really needs to stop this as well.  She asked me if it was protective for Parkinson's, and I told her that while smoking may decrease her risk factor for Parkinson's, there is no evidence that using tobacco products slows Parkinson's one has the diagnosis. 5.  Insomnia.  -She is doing fine with clonazepam.  She had some insomnia with the Rytary, but hopefully that will get better now that she has discontinued it. Risks, benefits, side effects and alternative therapies were discussed.  The opportunity to ask questions was given and they were  answered to the best of my ability.  The patient expressed understanding and willingness to follow the outlined treatment protocols.

## 2014-01-17 ENCOUNTER — Ambulatory Visit: Payer: Medicare Other

## 2014-01-17 DIAGNOSIS — G20 Parkinson's disease: Secondary | ICD-10-CM | POA: Diagnosis not present

## 2014-01-17 DIAGNOSIS — R269 Unspecified abnormalities of gait and mobility: Secondary | ICD-10-CM | POA: Diagnosis not present

## 2014-01-17 DIAGNOSIS — IMO0001 Reserved for inherently not codable concepts without codable children: Secondary | ICD-10-CM | POA: Diagnosis not present

## 2014-01-21 ENCOUNTER — Ambulatory Visit: Payer: Medicare Other

## 2014-01-21 ENCOUNTER — Telehealth: Payer: Self-pay | Admitting: Neurology

## 2014-01-21 DIAGNOSIS — IMO0001 Reserved for inherently not codable concepts without codable children: Secondary | ICD-10-CM | POA: Diagnosis not present

## 2014-01-21 DIAGNOSIS — G20 Parkinson's disease: Secondary | ICD-10-CM | POA: Diagnosis not present

## 2014-01-21 DIAGNOSIS — R269 Unspecified abnormalities of gait and mobility: Secondary | ICD-10-CM | POA: Diagnosis not present

## 2014-01-21 NOTE — Telephone Encounter (Signed)
Called [facility] back. They wanted to know if generic was okay because patient has always received brand name. I told them to go ahead and fill brand name since that is what patient has always gotten. They will call with any questions.

## 2014-01-21 NOTE — Telephone Encounter (Signed)
[facility] called needs to talk to someone about pt please call [phone] ref # [id number]

## 2014-01-23 ENCOUNTER — Ambulatory Visit: Payer: Medicare Other

## 2014-01-23 DIAGNOSIS — G20 Parkinson's disease: Secondary | ICD-10-CM | POA: Diagnosis not present

## 2014-01-23 DIAGNOSIS — R269 Unspecified abnormalities of gait and mobility: Secondary | ICD-10-CM | POA: Diagnosis not present

## 2014-01-23 DIAGNOSIS — IMO0001 Reserved for inherently not codable concepts without codable children: Secondary | ICD-10-CM | POA: Diagnosis not present

## 2014-01-28 ENCOUNTER — Ambulatory Visit: Payer: Medicare Other | Attending: Neurology | Admitting: Physical Therapy

## 2014-01-28 DIAGNOSIS — G20 Parkinson's disease: Secondary | ICD-10-CM | POA: Insufficient documentation

## 2014-01-28 DIAGNOSIS — R269 Unspecified abnormalities of gait and mobility: Secondary | ICD-10-CM | POA: Diagnosis not present

## 2014-01-28 DIAGNOSIS — IMO0001 Reserved for inherently not codable concepts without codable children: Secondary | ICD-10-CM | POA: Diagnosis not present

## 2014-01-28 DIAGNOSIS — G20A1 Parkinson's disease without dyskinesia, without mention of fluctuations: Secondary | ICD-10-CM | POA: Insufficient documentation

## 2014-01-30 ENCOUNTER — Ambulatory Visit: Payer: Medicare Other | Admitting: Physical Therapy

## 2014-02-04 ENCOUNTER — Telehealth: Payer: Self-pay | Admitting: Neurology

## 2014-02-04 ENCOUNTER — Ambulatory Visit: Payer: Medicare Other | Admitting: Physical Therapy

## 2014-02-04 NOTE — Telephone Encounter (Signed)
Have her try extra levodopa instead since the mirapex has caused issues (qid)

## 2014-02-04 NOTE — Telephone Encounter (Signed)
Patient aware to increase Levodopa instead of Mirapex and will let us know if this helps.

## 2014-02-04 NOTE — Telephone Encounter (Signed)
Pt called requesting to speak to a nurse regarding her meds. She feels that she is having a bad reaction.

## 2014-02-04 NOTE — Telephone Encounter (Signed)
Spoke with patient and she states constant pain in thighs since she decreased her Mirapex. She took a larger dose one day and she was better for the three days after. Please advise.

## 2014-02-11 ENCOUNTER — Ambulatory Visit: Payer: Medicare Other | Admitting: Physical Therapy

## 2014-02-13 ENCOUNTER — Telehealth: Payer: Self-pay | Admitting: Neurology

## 2014-02-13 NOTE — Telephone Encounter (Signed)
See if she can come down on levodopa to one tab daily or twice daily and increase mirapex to 1.5mg  at bedtime.  I hesitate to increase mirapex to higher doses because of history of compulsive spending.  Morgan K. Posey Pronto, DO

## 2014-02-13 NOTE — Telephone Encounter (Signed)
Patient made aware to increase Mirapex to 1.5 mg at night and cut back Levodopa to twice daily. She will try this and let us know how she does.

## 2014-02-13 NOTE — Telephone Encounter (Signed)
Spoke with patient and she states after going up on her Levodopa she "can not take it." She states the pain in her thighs is better, but now she has constant tapping of her toes and moving of her feet and she is rocking back and forth constantly. She would like to go back on the Mirapex 3 mg tablets. She still has RX available. She is currently taking Mirapex 0.75 mg at night. She states she spoke with her husband about compulsive spending habits on Mirapex and they agreed that that was a better alternative to how she feels right now. Please advise.

## 2014-03-07 ENCOUNTER — Telehealth: Payer: Self-pay | Admitting: Neurology

## 2014-03-07 MED ORDER — AMANTADINE HCL 100 MG PO CAPS: 100.0000 mg | ORAL_CAPSULE | Freq: Three times a day (TID) | ORAL | Status: DC

## 2014-03-07 NOTE — Telephone Encounter (Signed)
Patient states he has constant moving and shaking which is getting worse. She is currently taking 1 Carbidopa Levodopa in the morning and 1 at 4:00 pm. She is taking Mirapex 1.5 mg at night. Please advise.

## 2014-03-07 NOTE — Telephone Encounter (Signed)
Is she talking about tremor or dyskinesia?

## 2014-03-07 NOTE — Telephone Encounter (Signed)
Pt called requesting to speak to a nurse regarding her meds. Please call her back [phone]

## 2014-03-07 NOTE — Telephone Encounter (Signed)
From what she states it sounds like dyskinesia. She really wants to increase Mirapex.

## 2014-03-07 NOTE — Telephone Encounter (Signed)
Per Dr [name] to call in Amantadine since this is dyskinesia. Patient states a lot of rocking back and forth, toe tapping, etc. She was wary about starting a new medication when she was doing "well" on the Mirapex without the Levodopa. I advised that having compulsive behaviors was not an ideal situation but she stressed that her husband and her were okay with this as long as she did not have dyskinesia. I advised I sent in RX for Amantadine for her to take three times daily. She will decide whether she wants to start this. Appt made for her to discuss medications with Dr [name].

## 2014-03-10 DIAGNOSIS — Z23 Encounter for immunization: Secondary | ICD-10-CM | POA: Diagnosis not present

## 2014-03-19 ENCOUNTER — Telehealth: Payer: Self-pay | Admitting: Neurology

## 2014-03-19 NOTE — Telephone Encounter (Signed)
Pt is returning your call please call [phone]

## 2014-03-19 NOTE — Telephone Encounter (Signed)
Appt moved to Thursday pm.

## 2014-03-19 NOTE — Telephone Encounter (Signed)
LMOM for patient to call back. To see if she can move her appt from 3:00 pm on Friday to 8:15 am. Awaiting call back.

## 2014-03-20 ENCOUNTER — Ambulatory Visit (INDEPENDENT_AMBULATORY_CARE_PROVIDER_SITE_OTHER): Payer: Medicare Other | Admitting: Neurology

## 2014-03-20 ENCOUNTER — Encounter: Payer: Self-pay | Admitting: Neurology

## 2014-03-20 VITALS — BP 130/82 | HR 80 | Resp 16 | Ht 65.0 in | Wt 187.0 lb

## 2014-03-20 DIAGNOSIS — G20 Parkinson's disease: Secondary | ICD-10-CM | POA: Diagnosis not present

## 2014-03-20 DIAGNOSIS — G609 Hereditary and idiopathic neuropathy, unspecified: Secondary | ICD-10-CM | POA: Diagnosis not present

## 2014-03-20 IMAGING — US US ABDOMEN COMPLETE
1 series · 13 of 25 positions shown · non-contrast
Comparison: None.

CLINICAL DATA: Elevated liver function studies.

COMPLETE ABDOMINAL ULTRASOUND

[Series 1: us abdomen complete · 0.27mm/px · 13 of 83 slices shown]
[im 1/83]
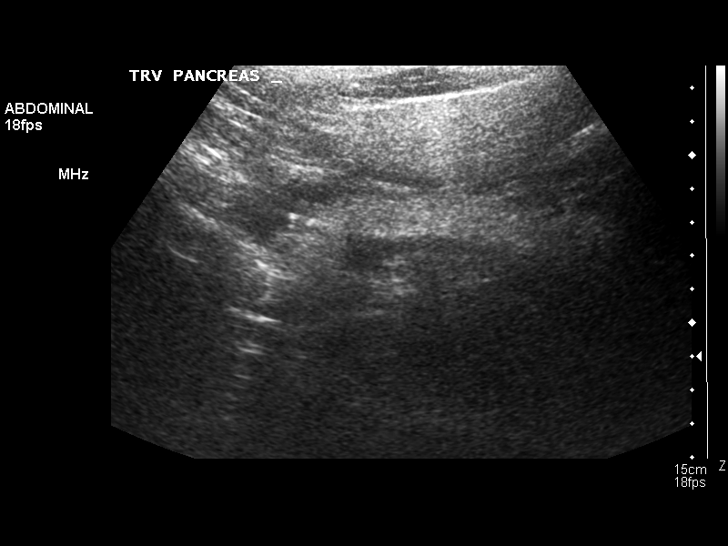
[im 7/83]
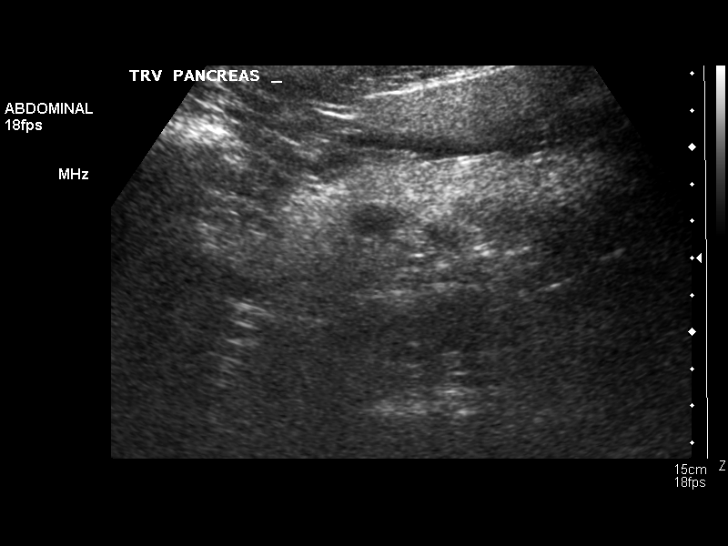
[im 14/83]
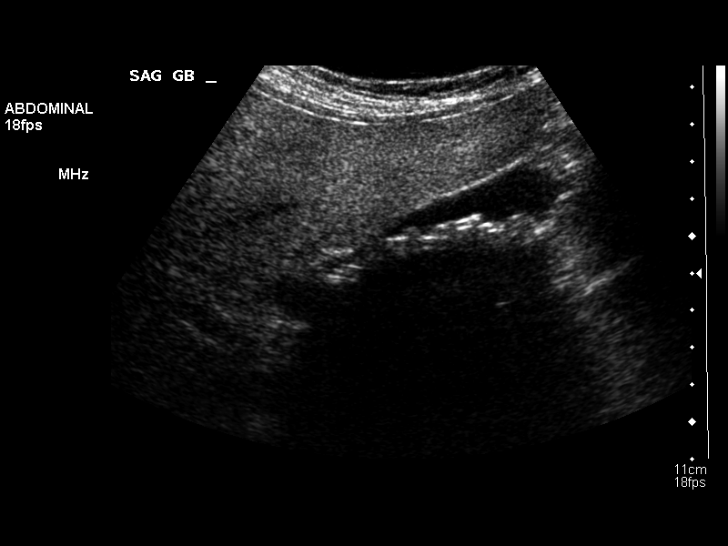
[im 21/83]
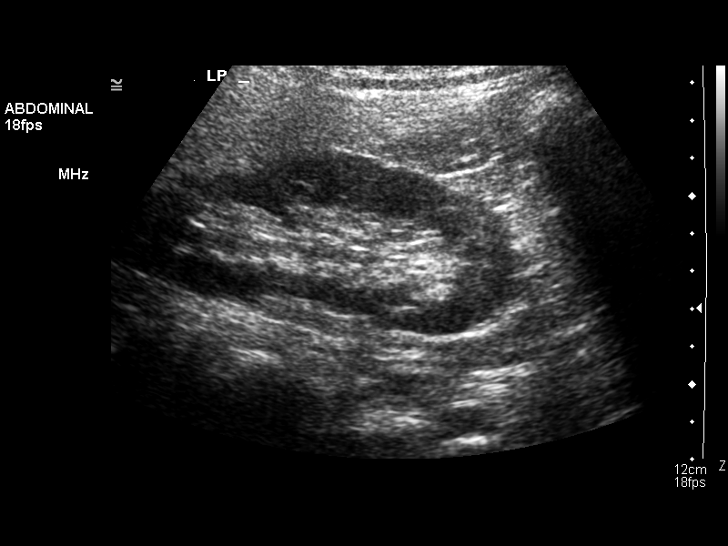
[im 28/83]
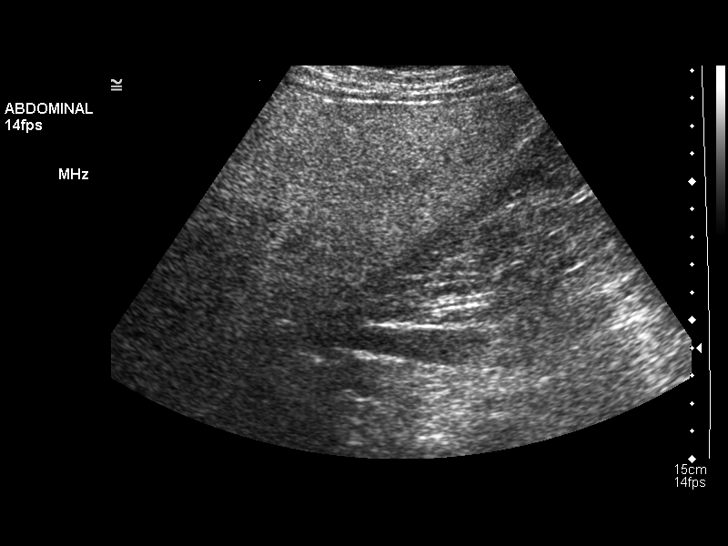
[im 35/83]
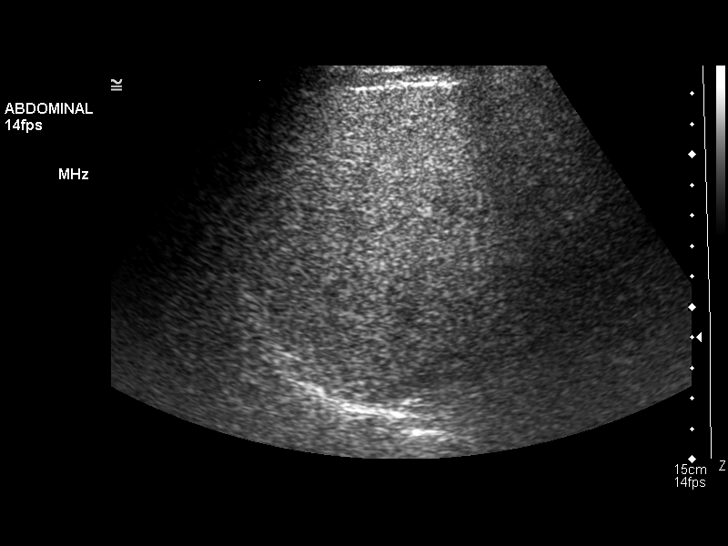
[im 42/83]
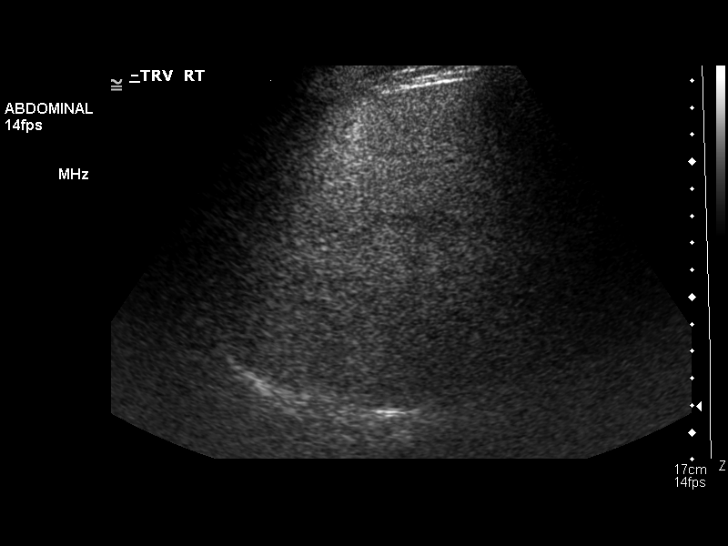
[im 48/83]
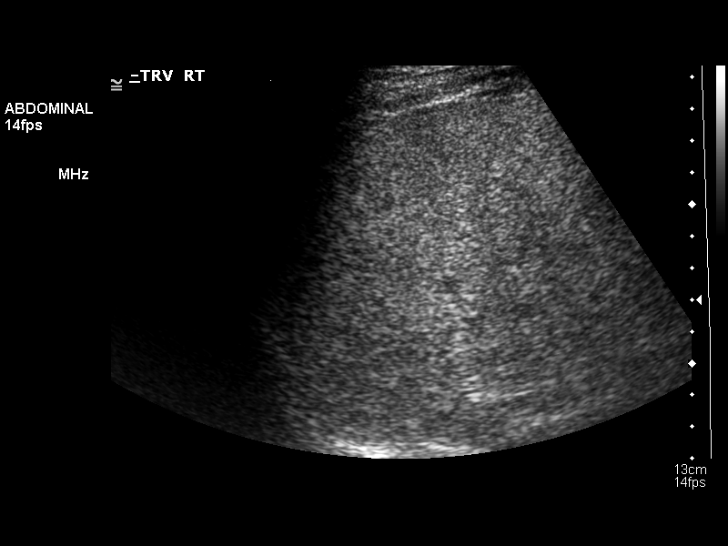
[im 55/83]
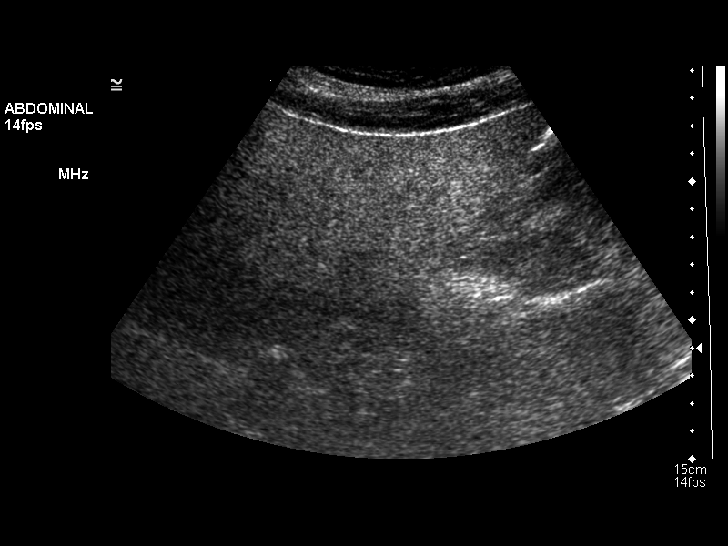
[im 62/83]
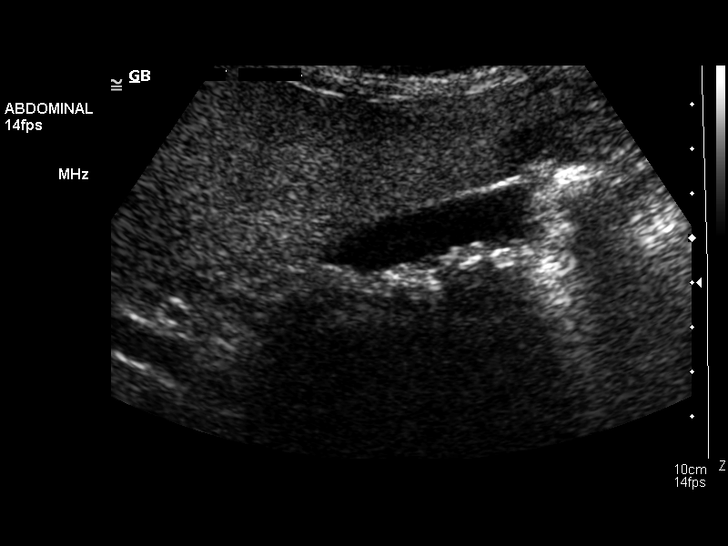
[im 69/83]
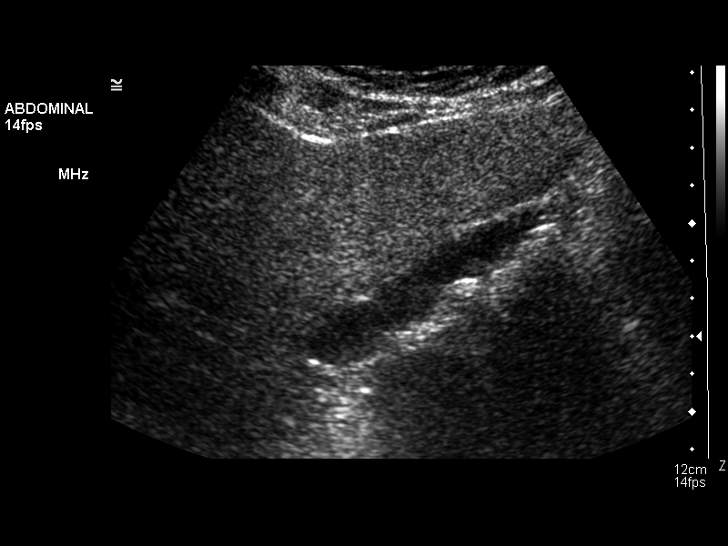
[im 76/83]
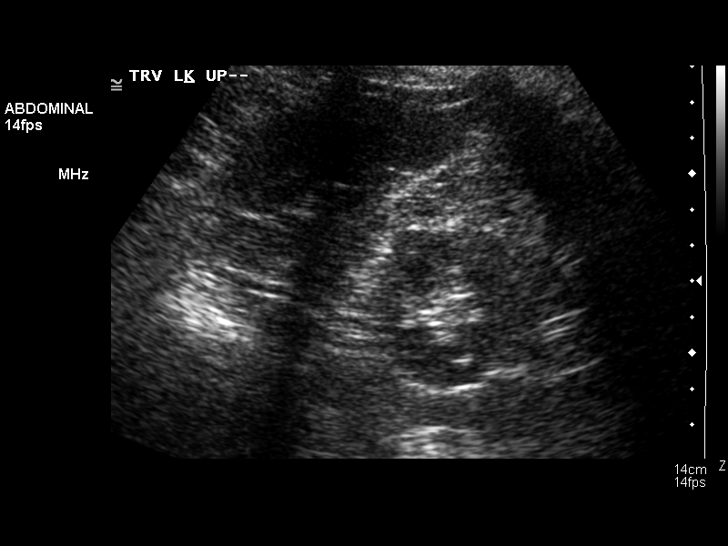
[im 83/83]
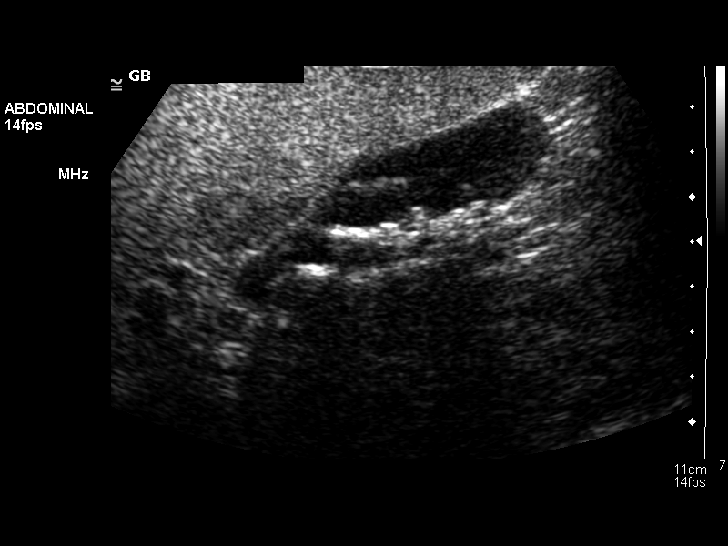

[13 of 25 positions shown; findings below may reference images not displayed]

FINDINGS: Gallbladder:  Numerous mobile echogenic and shadowing gallstones
layering in the gallbladder dependently.  No gallbladder wall
thickening, pericholecystic fluid or sonographic Murphy's sign to
suggest acute cholecystitis.

Common bile duct:  Normal in caliber measuring a maximum of 5.1mm.

Liver:  Markedly echogenic liver with decreased through
transmission and poor definition of the liver architecture
consistent with fatty infiltration.  No focal hepatic lesions or
intrahepatic biliary dilatation.

IVC:  Normal caliber.

Pancreas:  Sonographically unremarkable.

Spleen:  Normal size and echogenicity without focal lesions.

Right Kidney:  11.7 cm in length. Normal renal cortical thickness
and echogenicity without focal lesions or hydronephrosis.

Left Kidney:  12.6 cm in length. Normal renal cortical thickness
and echogenicity without focal lesions or hydronephrosis.

Abdominal aorta:  Normal caliber.
IMPRESSION: 1.  Cholelithiasis without sonographic findings for acute
cholecystitis.
2.  Normal caliber common bile duct.
3.  Diffuse fatty infiltration of the liver without focal hepatic
lesions.

## 2014-03-20 MED ORDER — ROPINIROLE HCL ER 2 MG PO TB24
4.0000 mg | ORAL_TABLET | Freq: Every day | ORAL | Status: DC
Start: 2014-03-20 — End: 2014-04-21

## 2014-03-20 NOTE — Progress Notes (Signed)
Morgan [name] was seen today in the movement disorders clinic for neurologic consultation at the request of Dr. [name] at [facility] internal medicine.  She is accompanied by her husband who helps to supplement the history.  She has previously seen Dr. neurology, both Dr. Krista Blue and Dr. Doy Mince.  I reviewed some of these notes.  The consultation is for the evaluation of Parkinson's disease.  The patient is a 63 y.o. right handed female reports that her first sx was "active dreaming."  However, the symptom that got her to the dr was L greater than R hand tremor, only when moving hands/drying hair.  This was in 2010.  She was started on carbidopa/levodopa just as a "trial" for 2 weeks and then it was stopped because it worked.  She was told that it was diagnostic of PD.  She was then started on the Mirapex ER 1.5 mg in 07/2009. and it was worked up to 3.0 mg.  She reports compulsive shopping (she overspends her "allowance).  She mostly internets shops.  She has always shopped but she has previously not gone over the "allowance."  She plays the lottery and plays more than previous.  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  She tried to get off the medication but it was unsuccessful because of worsening of PD.  She was placed on Azilect.  She has been on it for about 2 years.  She has not noted any benefits.    It was her impression that it made the mirapex last longer.  She feels strongly that the computer work and shopping became an issue because of the azilect.   04/19/13 update:  The patient presents today with her husband who supplements the history.  Once she discontinued the Azilect, she felt that her compulsive shopping got better.  Her husband feels that she is not exercising enough, but the patient estimates that she perhaps exercises 4 days per week, but not to the intensity that her husband would like.  She had one near fall since our last visit, but she was on a trail in [place] with 2 walking sticks and  one of them something to the ground and her husband caught her.  She is still taking clonazepam at night.  Her husband thinks that she takes it too early and falls asleep on the couch, but she denies this.  There have not been hallucinations.  There has not been nausea or vomiting.  She has been slower.   08/13/13 update:  The pt is f/u for PD.  She is accompanied by her husband who supplements the hx.  She is on mirapex 3 mg daily and carbidopa/levodopa 25/100 was added last visit.  She notices that she has more energy and can do more things, although her husband states that she does not necessarily do those things (including exercise). She noted that the feet "wobble" since being on the medication.  She also notices that her shopping "addiction" has returned.  She has had no falls.  No hallucinations.  No near syncope.  09/24/13 update:  The pt is f/u regarding PD.  She is accompanied by her husband who helps to supplement the history.  Last visit, I decreased her pramipexole to 2.25 mg daily secondary to compulsive shopping.  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  They  both agree that this could be pathologic.  She is now on carbidopa/levodopa 25/100, half a tablet 4 times per day.  Dyskinesia in the foot is better.  She is just getting over the flu.  No hallucinations.  She has not started exercising faithfully, primarily because she has not been feeling well.  No falls.    12/24/13 update:  The patient is following up in regards to her Parkinson's disease.  She is accompanied by her husband who helps to supplement the history.  Her Mirapex has been reduced to 1.5 mg daily secondary to compulsive behaviors.  She did call and ask if she could increase it, but I did not want to do that.  She is still having compulsive behaviors (shopping) and her  husband would like her to discontinue the medication altogether.  She is currently on carbidopa/levodopa 25/100, half a tablet 4 times per day.  She takes at approximately 8 AM, noon, 4 PM and 9 PM.  Unfortunately, the patient had dyskinesia at higher dosages.  Nonetheless, she states that she would rather have the dyskinesia than feel like she currently does.  She feels slow and achy and just does not feel like doing anything.  She was not having any hallucinations.  She does state that she signed up for a 5K in January and would like to start training for that.  She is not having any hallucinations.  No falls.  She is still on Cymbalta, 60 mg daily as well as clonazepam for insomnia.  01/16/14 update:  The patient is accompanied by her husband who helps to supplement the history.  Last visit, we started the patient on Rytary, and she is currently on 145 mg 3 times a day.  Unfortunately, the patient reports that she had insomnia on the medication and felt "woozy" and really overall felt better on the carbidopa/levodopa IR and would like to go back to that medication, even though she dyskinesia on low dose.  She asks me if she can wean the Mirapex and restart the levodopa.  [facility].  She is planning on working up to running a 5K at [employer] in January, 2016.  She remains on the Cymbalta for her fibromyalgia.  03/20/14 update:  The patient returns today as a work in, accompanied by her husband who supplements the history.  Much has happened since our last visit.  Last visit, I tried to decrease the patient's Mirapex ER to 0.75 mg daily, primarily because of compulsive behaviors.  I started her on levodopa.  She called me back and wanted to go up on the Mirapex because of cramping in the thigh, but instead I increased her levodopa to 4 times a day.  The cramping did get better, but she called later with dyskinesia.  At that time my partner  was on call and she reluctantly increased the patient's Mirapex back to 1.5 mg daily and decreased patient's levodopa to twice a day dosing.  The patient later called me with continued dyskinesias and wanted to go further up on the Mirapex, but I have been resistant because of her compulsive spending in the past.  Instead, on 03/07/2014 I asked her to try amantadine and called in a prescription for this.  Unfortunately, she stated that she read the side effects and decided that she was not going to pick this up and take it.  She is now only on Mirapex 1.5  mg daily and levodopa 25/100, one tablet in the morning and half tablet at night.  She would like to go back up on the Mirapex and her husband states that if he has to disable the computer to deal with her compulsive shopping then he will do that.  Neuroimaging has previously been performed.  It is available for my review today.  It was done in 2010.  There was mild small vessel disease.  The formal report is as follows:    MRI HEAD WITHOUT CONTRAST      Findings:    Gray-white matter differentiation is normal.  The sella is appropriate in signal and morphology.  Clival signal is intact. Cerebellar tonsils are at the level foramen magnum.  Odontoid process, the predental space and the prevertebral soft tissues are within normal limits.    Scattered lymph nodes are seen in the post triangle regions bilaterally, the largest being approximately 9 .1 mm on the left.    No abnormalities seen on diffusion weighted images.    A few scattered punctate foci of signal hyperintensity are seen in the subcortical white matter bifrontally, and to a lesser degree the biparietal subcortical white matter. Discrete small T2 and FLAIR hyperintensities are seen in the biparietal subcortical white matter adjacent to the occipital horns.    The ventricles are normal.  Mastoids are clear.  Internal auditory canals are symmetrical in signal and morphology.  Flow  voids are maintained in the major vessels at the cranial skull base.    Moderate inflammatory thickening of the mucosa in the ethmoids and the maxillary sinuses is seen.  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 No  blood breakdown products seen on SPGR sequences.    . IMPRESSION: 1.  No evidence of acute ischemia. 2.  Non specific subcortical white matter changes supratentorially .  These may reflect areas of ischemic gliosis related to small vessel disease due to hypertension and/or diabetes versus less likely a demyelinating process or vasculitis. 3.  Prominent perivascular spaces versus lacunes involving the parietal subcortical white matter. 4.  Inflammatory thickening of the mucosa in the ethmoids and the maxillary sinuses.    PREVIOUS MEDICATIONS: Mirapex and azilect; levodopa for a few weeks to "prove" benefit  ALLERGIES:   Allergies  Allergen Reactions  . Sporanox [Itraconazole]   . Tylenol [Acetaminophen]     CURRENT MEDICATIONS:  Current Outpatient Prescriptions on File Prior to Visit  Medication Sig Dispense Refill  . ALPRAZolam (XANAX) 0.25 MG tablet Take 0.25 mg by mouth 1 day or 1 dose.      Marland Kitchen aspirin 325 MG tablet Take 325 mg by mouth daily.      Marland Kitchen atorvastatin (LIPITOR) 10 MG tablet Take 10 mg by mouth daily.      . cholecalciferol (VITAMIN D) 1000 UNITS tablet Take 1,000 Units by mouth daily. Two daily      . clonazePAM (KLONOPIN) 1 MG tablet Take 1 tablet (1 mg total) by mouth at bedtime.  90 tablet  1  . co-enzyme Q-10 30 MG capsule Take 30 mg by mouth daily. Taking 60mg       . CYMBALTA 60 MG capsule Take 1 capsule (60 mg total) by mouth daily.  90 capsule  3  . hydrochlorothiazide (HYDRODIURIL) 25 MG tablet Take 25 mg by mouth daily.      . Magnesium 250 MG TABS Take by mouth 2 (  two) times daily.      . Multiple Vitamins-Minerals (ICAPS PO) Take by mouth.       . raloxifene (EVISTA) 60 MG tablet Take 60 mg by mouth daily.       No current facility-administered medications on file prior to visit.    PAST MEDICAL HISTORY:   Past Medical History  Diagnosis Date  . Osteopenia   . Hypertension   . Parkinson disease   . Fibromyalgia   . Hypercholesteremia     controlled on medication  . Melanoma     PAST SURGICAL HISTORY:   Past Surgical History  Procedure Laterality Date  . Tonsillectomy    . Tubal ligation      SOCIAL HISTORY:   History   Social History  . Marital Status: Married    Spouse Name: N/A    Number of Children: N/A  . 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 Smoking status: Former Smoker    Quit date: 09/29/1997  . Smokeless tobacco: Never Used     Comment: chews Nicorette, 10-12/day  . Alcohol Use: Yes     Comment: Occ  . Drug Use: No  . Sexual Activity: Not on file   Other Topics Concern  . Not on file   Social History Narrative  . No narrative on file    FAMILY HISTORY:   Family Status  Relation Status Death Age  . Mother Deceased     colon CA  . Father Alive     healthy  . Brother Alive     healthy  . Sister Alive     4, healthy  . Child Alive     healthy    ROS:  A complete 10 system review of systems was obtained and was unremarkable apart from what is mentioned above.  PHYSICAL EXAMINATION:    VITALS:   Filed Vitals:   03/20/14 1525  BP: 130/82  Pulse: 80  Resp: 16  Height: 5\' 5"  (1.651 m)  Weight: 187 lb (84.823 kg)    GEN:  The patient appears stated age and is in NAD. HEENT:  Normocephalic, atraumatic.  The mucous membranes are moist. The superficial temporal arteries are without ropiness or tenderness. CV:  RRR Lungs:  CTAB Neck/HEME:  There are no carotid bruits bilaterally.  Neurological examination:  Orientation: The patient is alert and oriented x3. Fund of knowledge is  appropriate.  Recent and remote memory are intact.  Attention and concentration are normal.    Able to name objects and repeat phrases.  Movement examination: Tone: There is mild-mod increased tone in the left upper extremity. Abnormal movements: None.  No dyskinesia or tremor. Coordination:  There is decreased hand opening and closing and decreased finger taps on the left. Gait and Station: The patient has no difficulty arising out of a deep-seated chair without the use of the hands. The patient's stride length is good today.  She has a negative pull test.  LABS:  Lab Results  Component Value Date   TSH 0.80 01/15/2013   Lab Results  Component Value Date   [id number] 01/15/2013    SPEP/UPEP negative.    Lab Results  Component Value Date   FOLATE 22.1 01/15/2013     ASSESSMENT/PLAN:  1.  Parkinsonism.  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    -We discussed the diagnosis  as well as pathophysiology of the disease.  We discussed treatment options as well as prognostic indicators.  Patient education was provided.  -We had a very lengthy discussion today.  The patient wanted to go up on Mirapex, but I really do not want to go back to her compulsive shopping.  In the end, we decided to transition to Requip, which should have a higher dose range for [place] to work with.  She will discontinue the Mirapex 1.5 mg and start Requip XL 4 mg daily.  She may need a somewhat higher dose, but we will see how she does.  Her goal is to get off of the levodopa, but for right now I want her to stay on it just so that I have that as a control and don't change too many things at once.  She is only on one tablet in the morning and half a tablet at night.  -We again talked about the importance of exercise.  She is now enrolled in the [place] bicycling program. 2.  probable peripheral neuropathy, idiopathic.  -She does have physical examination evidence of this.  Her  laboratory workup has been unremarkable.  She is on Cymbalta, although that is primarily for fibromyalgia. 3.  history of melanoma.  -There is some old literature that suggests that the addition of levodopa can increase risk of return melanoma, but new literature suggests that this is not the etiology.  4.  history of tobacco abuse.  -While the patient has given up smoking, she is using quite a bit of Nicorette gum per day.  I talked to her about the fact that she really needs to stop this as well.  She asked me if it was protective for Parkinson's, and I told her that while smoking may decrease her risk factor for Parkinson's, there is no evidence that using tobacco products slows Parkinson's one has the diagnosis. 5.  Insomnia.  -She is doing fine with clonazepam.  She had some insomnia with the Rytary, but hopefully that will get better now that she has discontinued it. Risks, benefits, side effects and alternative therapies were discussed.  The opportunity to ask questions was given and they were answered to the best of my ability.  The patient expressed understanding and willingness to follow the outlined treatment protocols. 6.  F/u at previously scheduled visit in 1 month.

## 2014-03-20 NOTE — Patient Instructions (Signed)
1. Discontinue Mirapex. 2. Start Requip 2 mg - 2 in the morning.  3. Follow up 4 weeks.

## 2014-03-21 ENCOUNTER — Ambulatory Visit: Payer: Medicare Other | Admitting: Neurology

## 2014-03-27 ENCOUNTER — Telehealth: Payer: Self-pay | Admitting: Neurology

## 2014-03-27 NOTE — Telephone Encounter (Signed)
Patient made aware to increase to 6 mg at night and she will call next week and let us know how she is doing.

## 2014-03-27 NOTE — Telephone Encounter (Signed)
Pt called requesting to speak to a nurse regarding the new script Dr. Carles Collet Rx her. C/B [phone]

## 2014-03-27 NOTE — Telephone Encounter (Signed)
Spoke with patient - she started on Requip 4mg  XR after last visit. She has tried taking this in the morning (6 am) with the effects not kicking in until around 1 pm. She has tried taking at night and she states the effects wear off about 2 pm. Please advise if we should slightly increase to 6 mg or wait to speak with Dr [name].

## 2014-03-27 NOTE — Telephone Encounter (Signed)
OK to increase to Requip 6mg  XR and see how it works.  Whitleigh Garramone K. Posey Pronto, DO

## 2014-04-02 ENCOUNTER — Telehealth: Payer: Self-pay | Admitting: Neurology

## 2014-04-02 NOTE — Telephone Encounter (Signed)
Spoke with patient and her husband. They were not satisfied with this. They want to increase dose now. When I tried to explain that she has only been on this increase for a short amount of time, they did not seem to want to hear it. Her husband stated that we are not the one watching her suffer, he became emotional and stated he wants to increase this to 2 tablets in the morning and 2 tablets at night. I advised this was not your advise but I would pass this along and let you know what they want to do.

## 2014-04-02 NOTE — Telephone Encounter (Signed)
Spoke with spouse and advised he can increase to 2 tablets BID. They, again, mentioned the Mirapex and wanting to go back on this. I told them to give it a few days to work. They will touch base at the end of the week and let me know how she is feeling.

## 2014-04-02 NOTE — Telephone Encounter (Signed)
Pt's spouse called requesting to either speak with [facility] or Dr. Carles Collet. C/B [phone]

## 2014-04-02 NOTE — Telephone Encounter (Signed)
Patient states after increasing to Requip 6 mg tablets that she feels worse. She states it is harder to get up from sitting and she has constant pain in her legs. At the same time as the increase in Requip she decreased her Levodopa on her own down to 1/2 tablet in the morning and 1/2 tablet in the evening because she feels that this medication is making things worse. Please advise.

## 2014-04-02 NOTE — Telephone Encounter (Signed)
Pt would like to speak to a nurse regarding her meds. C/B [phone]

## 2014-04-02 NOTE — Telephone Encounter (Signed)
She has 2mg  tablets so lets try 2 tablets in the AM, and 1 in evening.  We may need to increase it but she has only been on this dose little time.

## 2014-04-02 NOTE — Telephone Encounter (Signed)
Okay.  But split up bid.

## 2014-04-02 NOTE — Telephone Encounter (Signed)
She states sometimes she is okay until around 12:30-1:00pm and some days she wakes up with the symptoms. How should I tell her to split it?

## 2014-04-02 NOTE — Telephone Encounter (Signed)
She may need to try bid dosing instead of qday with the requip.  Is it any particular time of day with the requip?  Still may not be on enough as well

## 2014-04-03 DIAGNOSIS — L821 Other seborrheic keratosis: Secondary | ICD-10-CM | POA: Diagnosis not present

## 2014-04-03 DIAGNOSIS — L219 Seborrheic dermatitis, unspecified: Secondary | ICD-10-CM | POA: Diagnosis not present

## 2014-04-03 DIAGNOSIS — D239 Other benign neoplasm of skin, unspecified: Secondary | ICD-10-CM | POA: Diagnosis not present

## 2014-04-03 DIAGNOSIS — Z85820 Personal history of malignant melanoma of skin: Secondary | ICD-10-CM | POA: Diagnosis not present

## 2014-04-07 ENCOUNTER — Telehealth: Payer: Self-pay | Admitting: Neurology

## 2014-04-07 NOTE — Telephone Encounter (Signed)
Pt called requesting to speak with [facility] regarding her meds. C/B [phone]

## 2014-04-08 ENCOUNTER — Telehealth: Payer: Self-pay | Admitting: Neurology

## 2014-04-08 NOTE — Telephone Encounter (Signed)
Patient made aware. They want to keep follow up appt. They will stop Requip and start Mirapex 1.5 mg at night. They still have medication. They will continue Levodopa for now.

## 2014-04-08 NOTE — Telephone Encounter (Signed)
Patient called and states she feels worse that she ever has. She can not get out of chairs. She can not drive. She feels very stiff and states she feels like she is "back to the beginning." They would like to go back on Mirapex. Please advise.

## 2014-04-08 NOTE — Telephone Encounter (Signed)
Okay but she wasn't doing that well on the mirapex 1.5 either.  Would she like [facility]

## 2014-04-21 ENCOUNTER — Encounter: Payer: Self-pay | Admitting: Neurology

## 2014-04-21 ENCOUNTER — Ambulatory Visit (INDEPENDENT_AMBULATORY_CARE_PROVIDER_SITE_OTHER): Payer: Medicare Other | Admitting: Neurology

## 2014-04-21 VITALS — BP 128/84 | HR 100 | Resp 20 | Ht 65.0 in | Wt 185.0 lb

## 2014-04-21 DIAGNOSIS — G609 Hereditary and idiopathic neuropathy, unspecified: Secondary | ICD-10-CM | POA: Diagnosis not present

## 2014-04-21 DIAGNOSIS — M797 Fibromyalgia: Secondary | ICD-10-CM

## 2014-04-21 DIAGNOSIS — IMO0001 Reserved for inherently not codable concepts without codable children: Secondary | ICD-10-CM | POA: Diagnosis not present

## 2014-04-21 DIAGNOSIS — G20 Parkinson's disease: Secondary | ICD-10-CM

## 2014-04-21 MED ORDER — PRAMIPEXOLE DIHYDROCHLORIDE ER 2.25 MG PO TB24: 1.0000 | ORAL_TABLET | Freq: Every day | ORAL | Status: DC

## 2014-04-21 NOTE — Progress Notes (Signed)
Morgan [name] was seen today in the movement disorders clinic for neurologic consultation at the request of Dr. [name] at [facility] internal medicine.  She is accompanied by her husband who helps to supplement the history.  She has previously seen Dr. neurology, both Dr. Krista Blue and Dr. Doy Mince.  I reviewed some of these notes.  The consultation is for the evaluation of Parkinson's disease.  The patient is a 63 y.o. right handed female reports that her first sx was "active dreaming."  However, the symptom that got her to the dr was L greater than R hand tremor, only when moving hands/drying hair.  This was in 2010.  She was started on carbidopa/levodopa just as a "trial" for 2 weeks and then it was stopped because it worked.  She was told that it was diagnostic of PD.  She was then started on the Mirapex ER 1.5 mg in 07/2009. and it was worked up to 3.0 mg.  She reports compulsive shopping (she overspends her "allowance).  She mostly internets shops.  She has always shopped but she has previously not gone over the "allowance."  She plays the lottery and plays more than previous.  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  She tried to get off the medication but it was unsuccessful because of worsening of PD.  She was placed on Azilect.  She has been on it for about 2 years.  She has not noted any benefits.    It was her impression that it made the mirapex last longer.  She feels strongly that the computer work and shopping became an issue because of the azilect.   04/19/13 update:  The patient presents today with her husband who supplements the history.  Once she discontinued the Azilect, she felt that her compulsive shopping got better.  Her husband feels that she is not exercising enough, but the patient estimates that she perhaps exercises 4 days per week, but not to the intensity that her husband would like.  She had one near fall since our last visit, but she was on a trail in [place] with 2 walking sticks and  one of them something to the ground and her husband caught her.  She is still taking clonazepam at night.  Her husband thinks that she takes it too early and falls asleep on the couch, but she denies this.  There have not been hallucinations.  There has not been nausea or vomiting.  She has been slower.   08/13/13 update:  The pt is f/u for PD.  She is accompanied by her husband who supplements the hx.  She is on mirapex 3 mg daily and carbidopa/levodopa 25/100 was added last visit.  She notices that she has more energy and can do more things, although her husband states that she does not necessarily do those things (including exercise). She noted that the feet "wobble" since being on the medication.  She also notices that her shopping "addiction" has returned.  She has had no falls.  No hallucinations.  No near syncope.  09/24/13 update:  The pt is f/u regarding PD.  She is accompanied by her husband who helps to supplement the history.  Last visit, I decreased her pramipexole to 2.25 mg daily secondary to compulsive shopping.  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  They  both agree that this could be pathologic.  She is now on carbidopa/levodopa 25/100, half a tablet 4 times per day.  Dyskinesia in the foot is better.  She is just getting over the flu.  No hallucinations.  She has not started exercising faithfully, primarily because she has not been feeling well.  No falls.    12/24/13 update:  The patient is following up in regards to her Parkinson's disease.  She is accompanied by her husband who helps to supplement the history.  Her Mirapex has been reduced to 1.5 mg daily secondary to compulsive behaviors.  She did call and ask if she could increase it, but I did not want to do that.  She is still having compulsive behaviors (shopping) and her  husband would like her to discontinue the medication altogether.  She is currently on carbidopa/levodopa 25/100, half a tablet 4 times per day.  She takes at approximately 8 AM, noon, 4 PM and 9 PM.  Unfortunately, the patient had dyskinesia at higher dosages.  Nonetheless, she states that she would rather have the dyskinesia than feel like she currently does.  She feels slow and achy and just does not feel like doing anything.  She was not having any hallucinations.  She does state that she signed up for a 5K in January and would like to start training for that.  She is not having any hallucinations.  No falls.  She is still on Cymbalta, 60 mg daily as well as clonazepam for insomnia.  01/16/14 update:  The patient is accompanied by her husband who helps to supplement the history.  Last visit, we started the patient on Rytary, and she is currently on 145 mg 3 times a day.  Unfortunately, the patient reports that she had insomnia on the medication and felt "woozy" and really overall felt better on the carbidopa/levodopa IR and would like to go back to that medication, even though she dyskinesia on low dose.  She asks me if she can wean the Mirapex and restart the levodopa.  [facility].  She is planning on working up to running a 5K at [employer] in January, 2016.  She remains on the Cymbalta for her fibromyalgia.  03/20/14 update:  The patient returns today as a work in, accompanied by her husband who supplements the history.  Much has happened since our last visit.  Last visit, I tried to decrease the patient's Mirapex ER to 0.75 mg daily, primarily because of compulsive behaviors.  I started her on levodopa.  She called me back and wanted to go up on the Mirapex because of cramping in the thigh, but instead I increased her levodopa to 4 times a day.  The cramping did get better, but she called later with dyskinesia.  At that time my partner  was on call and she reluctantly increased the patient's Mirapex back to 1.5 mg daily and decreased patient's levodopa to twice a day dosing.  The patient later called me with continued dyskinesias and wanted to go further up on the Mirapex, but I have been resistant because of her compulsive spending in the past.  Instead, on 03/07/2014 I asked her to try amantadine and called in a prescription for this.  Unfortunately, she stated that she read the side effects and decided that she was not going to pick this up and take it.  She is now only on Mirapex 1.5  mg daily and levodopa 25/100, one tablet in the morning and half tablet at night.  She would like to go back up on the Mirapex and her husband states that if he has to disable the computer to deal with her compulsive shopping then he will do that.  04/21/14 update:  Pt returns today for follow up, accompanied by her husband who supplements the history.  She is back on mirapex, 1.5 mg daily.  She did not do well on the requip and was pretty insistent on going back on the mirapex despite previous convulsive behaviors.  She wants to go off levodopa 25/100, and she is on it 1/2 bid but often forgets the second dose.  She is able to drive again.   She states that she thinks that she needs to go back up to the 3.0 of the mirapex.  Her husband is not now completely convinced that the mirapex caused compulsive shopping but thinks that boredom did that.  They are getting ready to go on a 14 day [place]ney cruise at the end of October.   Neuroimaging has previously been performed.  It is available for my review today.  It was done in 2010.  There was mild small vessel disease.  The formal report is as follows:    MRI HEAD WITHOUT CONTRAST      Findings:    Gray-white matter differentiation is normal.  The sella is appropriate in signal and morphology.  Clival signal is intact. Cerebellar tonsils are at the level foramen magnum.  Odontoid process, the predental space  and the prevertebral soft tissues are within normal limits.    Scattered lymph nodes are seen in the post triangle regions bilaterally, the largest being approximately 9 .1 mm on the left.    No abnormalities seen on diffusion weighted images.    A few scattered punctate foci of signal hyperintensity are seen in the subcortical white matter bifrontally, and to a lesser degree the biparietal subcortical white matter. Discrete small T2 and FLAIR hyperintensities are seen in the biparietal subcortical white matter adjacent to the occipital horns.    The ventricles are normal.  Mastoids are clear.  Internal auditory canals are symmetrical in signal and morphology.  Flow voids are maintained in the major vessels at the cranial skull base.    Moderate inflammatory thickening of the mucosa in the ethmoids and the maxillary sinuses is seen.  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 No  blood breakdown products seen on SPGR sequences.    . IMPRESSION: 1.  No evidence of acute ischemia. 2.  Non specific subcortical white matter changes supratentorially .  These may reflect areas of ischemic gliosis related to small vessel disease due to hypertension and/or diabetes versus less likely a demyelinating process or vasculitis. 3.  Prominent perivascular spaces versus lacunes involving the parietal subcortical white matter. 4.  Inflammatory thickening of the mucosa in the ethmoids and the maxillary sinuses.    PREVIOUS MEDICATIONS: Mirapex and azilect; levodopa for a few weeks to "prove" benefit; requip  ALLERGIES:   Allergies  Allergen Reactions  . Sporanox [Itraconazole]   . Tylenol [Acetaminophen]     CURRENT MEDICATIONS:  Current Outpatient Prescriptions on File Prior to Visit  Medication Sig Dispense Refill  . ALPRAZolam (XANAX) 0.25 MG tablet Take 0.25 mg by mouth 1 day or 1  dose.      Marland Kitchen  aspirin 325 MG tablet Take 325 mg by mouth daily.      Marland Kitchen atorvastatin (LIPITOR) 10 MG tablet Take 10 mg by mouth daily.      . carbidopa-levodopa (SINEMET IR) 25-100 MG per tablet Take 1 tablet by mouth 2 (two) times daily. 1 tablet in the morning, 1/2 in the afternoon      . cholecalciferol (VITAMIN D) 1000 UNITS tablet Take 1,000 Units by mouth daily. Two daily      . clonazePAM (KLONOPIN) 1 MG tablet Take 1 tablet (1 mg total) by mouth at bedtime.  90 tablet  1  . co-enzyme Q-10 30 MG capsule Take 30 mg by mouth daily. Taking 60mg       . CYMBALTA 60 MG capsule Take 1 capsule (60 mg total) by mouth daily.  90 capsule  3  . hydrochlorothiazide (HYDRODIURIL) 25 MG tablet Take 25 mg by mouth daily.      . Magnesium 250 MG TABS Take by mouth 2 (two) times daily.      . Multiple Vitamins-Minerals (ICAPS PO) Take by mouth.      . raloxifene (EVISTA) 60 MG tablet Take 60 mg by mouth daily.       No current facility-administered medications on file prior to visit.    PAST MEDICAL HISTORY:   Past Medical History  Diagnosis Date  . Osteopenia   . Hypertension   . Parkinson disease   . Fibromyalgia   . Hypercholesteremia     controlled on medication  . Melanoma     PAST SURGICAL HISTORY:   Past Surgical History  Procedure Laterality Date  . Tonsillectomy    . Tubal ligation      SOCIAL HISTORY:   History   Social History  . Marital Status: Married    Spouse Name: N/A    Number of Children: N/A  . 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 Smoking status: Former Smoker    Quit date: 09/29/1997  . Smokeless tobacco: Never Used     Comment: chews Nicorette, 10-12/day  . Alcohol Use: Yes     Comment: Occ  . Drug Use: No  . Sexual Activity: Not on file   Other Topics Concern  . Not on file   Social History Narrative  . No narrative on file    FAMILY HISTORY:   Family Status   Relation Status Death Age  . Mother Deceased     colon CA  . Father Alive     healthy  . Brother Alive     healthy  . Sister Alive     4, healthy  . Child Alive     healthy    ROS:  A complete 10 system review of systems was obtained and was unremarkable apart from what is mentioned above.  PHYSICAL EXAMINATION:    VITALS:   Filed Vitals:   04/21/14 1454  BP: 128/84  Pulse: 100  Resp: 20  Height: 5\' 5"  (1.651 m)  Weight: 185 lb (83.915 kg)    GEN:  The patient appears stated age and is in NAD. HEENT:  Normocephalic, atraumatic.  The mucous membranes are moist. The superficial temporal arteries are without ropiness or tenderness. CV:  RRR Lungs:  CTAB Neck/HEME:  There are no carotid bruits bilaterally.  Neurological examination:  Orientation: The patient is alert and oriented x3. Fund of knowledge is appropriate.  Recent and remote memory are intact.  Attention and concentration are normal.    Able to name objects and repeat phrases.  Movement examination: Tone: There is mild-mod increased tone in the UE's.   Abnormal movements: Rare tremor in the LLE. Coordination:  There is minimal decreased hand opening and closing and decreased finger taps on the left. Gait and Station: The patient has no difficulty arising out of a deep-seated chair without the use of the hands. The patient's stride length is good today.  She has a negative pull test.  LABS:  Lab Results  Component Value Date   TSH 0.80 01/15/2013   Lab Results  Component Value Date   [id number] 829 01/15/2013    SPEP/UPEP negative.    Lab Results  Component Value Date   FOLATE 22.1 01/15/2013     ASSESSMENT/PLAN:  1.  Parkinsonism.  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    -We discussed the diagnosis as well as pathophysiology of the disease.  We discussed treatment options as well as prognostic indicators.  Patient education was provided.  -We had  another long discussion today.  She wants to go up on the Mirapex to 3.0 mg, but I increased her only to 2.25 mg.  I feel pretty confident that she will call back and want to go up further, but I said that we need to try to 2.25 mg first.  She and her husband both understand the increased risk of compulsive behaviors (shopping in her case) as the dose gets higher.  -We again talked about the importance of exercise.  She is now enrolled in the [employer] [facility] program. 2.  probable peripheral neuropathy, idiopathic.  -She does have physical examination evidence of this.  Her laboratory workup has been unremarkable.  She is on Cymbalta, although that is primarily for fibromyalgia. 3.  history of melanoma.  -There is some old literature that suggests that the addition of levodopa can increase risk of return melanoma, but new literature suggests that this is not the etiology.   -She just had her skin exam in 03/2014 4.  history of tobacco abuse.  -While the patient has given up smoking, she is using quite a bit of Nicorette gum per day.  I talked to her about the fact that she really needs to stop this as well.  She is just not ready, stating that she has done this since 2010 and doesn't feel the need to quit. 5.  Insomnia.  -She is doing fine with clonazepam.  She does not use it nightly. 6.  F/u in 3-4 months, sooner should new neurologic issues arise.

## 2014-05-06 ENCOUNTER — Other Ambulatory Visit: Payer: Self-pay | Admitting: Neurology

## 2014-05-06 MED ORDER — CLONAZEPAM 1 MG PO TABS: 1.0000 mg | ORAL_TABLET | Freq: Every day | ORAL | Status: DC

## 2014-05-06 NOTE — Telephone Encounter (Signed)
Klonopin refill requested. Per last office note- patient to remain on medication. Refill approved and sent to patient's pharmacy via fax at [phone] with confirmation received.

## 2014-05-23 DIAGNOSIS — H5210 Myopia, unspecified eye: Secondary | ICD-10-CM | POA: Diagnosis not present

## 2014-05-23 DIAGNOSIS — H43819 Vitreous degeneration, unspecified eye: Secondary | ICD-10-CM | POA: Diagnosis not present

## 2014-05-23 DIAGNOSIS — H3530 Unspecified macular degeneration: Secondary | ICD-10-CM | POA: Diagnosis not present

## 2014-06-10 ENCOUNTER — Telehealth: Payer: Self-pay | Admitting: Neurology

## 2014-06-10 NOTE — Telephone Encounter (Signed)
ok 

## 2014-06-10 NOTE — Telephone Encounter (Signed)
Pt called requesting to speak to a nurse regarding her medications.  C/B [phone]

## 2014-06-10 NOTE — Telephone Encounter (Signed)
Patient made aware it was okay. She has 3 mg and did not need a prescription called in.

## 2014-06-10 NOTE — Telephone Encounter (Signed)
Patient states still having some pain in legs and tremor. She is doing much better than before, but would still like to increase Mirapex to 3 mg. Please advise.

## 2014-07-18 DIAGNOSIS — M810 Age-related osteoporosis without current pathological fracture: Secondary | ICD-10-CM | POA: Diagnosis not present

## 2014-07-18 DIAGNOSIS — E559 Vitamin D deficiency, unspecified: Secondary | ICD-10-CM | POA: Diagnosis not present

## 2014-07-18 DIAGNOSIS — Z1231 Encounter for screening mammogram for malignant neoplasm of breast: Secondary | ICD-10-CM | POA: Diagnosis not present

## 2014-07-22 DIAGNOSIS — E780 Pure hypercholesterolemia: Secondary | ICD-10-CM | POA: Diagnosis not present

## 2014-07-22 DIAGNOSIS — I10 Essential (primary) hypertension: Secondary | ICD-10-CM | POA: Diagnosis not present

## 2014-07-22 DIAGNOSIS — M797 Fibromyalgia: Secondary | ICD-10-CM | POA: Diagnosis not present

## 2014-07-22 DIAGNOSIS — Z1389 Encounter for screening for other disorder: Secondary | ICD-10-CM | POA: Diagnosis not present

## 2014-07-22 DIAGNOSIS — Z0000 Encounter for general adult medical examination without abnormal findings: Secondary | ICD-10-CM | POA: Diagnosis not present

## 2014-07-22 DIAGNOSIS — F419 Anxiety disorder, unspecified: Secondary | ICD-10-CM | POA: Diagnosis not present

## 2014-07-29 ENCOUNTER — Ambulatory Visit (INDEPENDENT_AMBULATORY_CARE_PROVIDER_SITE_OTHER): Payer: Medicare Other | Admitting: Neurology

## 2014-07-29 ENCOUNTER — Encounter: Payer: Self-pay | Admitting: Neurology

## 2014-07-29 VITALS — BP 110/74 | HR 84 | Ht 64.5 in | Wt 189.0 lb

## 2014-07-29 DIAGNOSIS — M797 Fibromyalgia: Secondary | ICD-10-CM

## 2014-07-29 DIAGNOSIS — F411 Generalized anxiety disorder: Secondary | ICD-10-CM | POA: Diagnosis not present

## 2014-07-29 DIAGNOSIS — G20 Parkinson's disease: Secondary | ICD-10-CM

## 2014-07-29 DIAGNOSIS — Z720 Tobacco use: Secondary | ICD-10-CM

## 2014-07-29 DIAGNOSIS — F17200 Nicotine dependence, unspecified, uncomplicated: Secondary | ICD-10-CM

## 2014-07-29 DIAGNOSIS — G20A1 Parkinson's disease without dyskinesia, without mention of fluctuations: Secondary | ICD-10-CM

## 2014-07-29 MED ORDER — PRAMIPEXOLE DIHYDROCHLORIDE ER 0.75 MG PO TB24: 1.0000 | ORAL_TABLET | Freq: Every day | ORAL | Status: DC

## 2014-07-29 NOTE — Progress Notes (Signed)
Morgan [name] was seen today in the movement disorders clinic for neurologic consultation at the request of Dr. [name] at [facility] internal medicine.  She is accompanied by her husband who helps to supplement the history.  She has previously seen Dr. neurology, both Dr. Krista Blue and Dr. Doy Mince.  I reviewed some of these notes.  The consultation is for the evaluation of Parkinson's disease.  The patient is a 63 y.o. right handed female reports that her first sx was "active dreaming."  However, the symptom that got her to the dr was L greater than R hand tremor, only when moving hands/drying hair.  This was in 2010.  She was started on carbidopa/levodopa just as a "trial" for 2 weeks and then it was stopped because it worked.  She was told that it was diagnostic of PD.  She was then started on the Mirapex ER 1.5 mg in 07/2009. and it was worked up to 3.0 mg.  She reports compulsive shopping (she overspends her "allowance).  She mostly internets shops.  She has always shopped but she has previously not gone over the "allowance."  She plays the lottery and plays more than previous.  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  She tried to get off the medication but it was unsuccessful because of worsening of PD.  She was placed on Azilect.  She has been on it for about 2 years.  She has not noted any benefits.    It was her impression that it made the mirapex last longer.  She feels strongly that the computer work and shopping became an issue because of the azilect.   04/19/13 update:  The patient presents today with her husband who supplements the history.  Once she discontinued the Azilect, she felt that her compulsive shopping got better.  Her husband feels that she is not exercising enough, but the patient estimates that she perhaps exercises 4 days per week, but not to the intensity that her husband would like.  She had one near fall since our last visit, but she was on a trail in [place] with 2 walking sticks and  one of them something to the ground and her husband caught her.  She is still taking clonazepam at night.  Her husband thinks that she takes it too early and falls asleep on the couch, but she denies this.  There have not been hallucinations.  There has not been nausea or vomiting.  She has been slower.   08/13/13 update:  The pt is f/u for PD.  She is accompanied by her husband who supplements the hx.  She is on mirapex 3 mg daily and carbidopa/levodopa 25/100 was added last visit.  She notices that she has more energy and can do more things, although her husband states that she does not necessarily do those things (including exercise). She noted that the feet "wobble" since being on the medication.  She also notices that her shopping "addiction" has returned.  She has had no falls.  No hallucinations.  No near syncope.  09/24/13 update:  The pt is f/u regarding PD.  She is accompanied by her husband who helps to supplement the history.  Last visit, I decreased her pramipexole to 2.25 mg daily secondary to compulsive shopping.  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  They  both agree that this could be pathologic.  She is now on carbidopa/levodopa 25/100, half a tablet 4 times per day.  Dyskinesia in the foot is better.  She is just getting over the flu.  No hallucinations.  She has not started exercising faithfully, primarily because she has not been feeling well.  No falls.    12/24/13 update:  The patient is following up in regards to her Parkinson's disease.  She is accompanied by her husband who helps to supplement the history.  Her Mirapex has been reduced to 1.5 mg daily secondary to compulsive behaviors.  She did call and ask if she could increase it, but I did not want to do that.  She is still having compulsive behaviors (shopping) and her  husband would like her to discontinue the medication altogether.  She is currently on carbidopa/levodopa 25/100, half a tablet 4 times per day.  She takes at approximately 8 AM, noon, 4 PM and 9 PM.  Unfortunately, the patient had dyskinesia at higher dosages.  Nonetheless, she states that she would rather have the dyskinesia than feel like she currently does.  She feels slow and achy and just does not feel like doing anything.  She was not having any hallucinations.  She does state that she signed up for a 5K in January and would like to start training for that.  She is not having any hallucinations.  No falls.  She is still on Cymbalta, 60 mg daily as well as clonazepam for insomnia.  01/16/14 update:  The patient is accompanied by her husband who helps to supplement the history.  Last visit, we started the patient on Rytary, and she is currently on 145 mg 3 times a day.  Unfortunately, the patient reports that she had insomnia on the medication and felt "woozy" and really overall felt better on the carbidopa/levodopa IR and would like to go back to that medication, even though she dyskinesia on low dose.  She asks me if she can wean the Mirapex and restart the levodopa.  [facility].  She is planning on working up to running a 5K at [employer] in January, 2016.  She remains on the Cymbalta for her fibromyalgia.  03/20/14 update:  The patient returns today as a work in, accompanied by her husband who supplements the history.  Much has happened since our last visit.  Last visit, I tried to decrease the patient's Mirapex ER to 0.75 mg daily, primarily because of compulsive behaviors.  I started her on levodopa.  She called me back and wanted to go up on the Mirapex because of cramping in the thigh, but instead I increased her levodopa to 4 times a day.  The cramping did get better, but she called later with dyskinesia.  At that time my partner  was on call and she reluctantly increased the patient's Mirapex back to 1.5 mg daily and decreased patient's levodopa to twice a day dosing.  The patient later called me with continued dyskinesias and wanted to go further up on the Mirapex, but I have been resistant because of her compulsive spending in the past.  Instead, on 03/07/2014 I asked her to try amantadine and called in a prescription for this.  Unfortunately, she stated that she read the side effects and decided that she was not going to pick this up and take it.  She is now only on Mirapex 1.5  mg daily and levodopa 25/100, one tablet in the morning and half tablet at night.  She would like to go back up on the Mirapex and her husband states that if he has to disable the computer to deal with her compulsive shopping then he will do that.  04/21/14 update:  Pt returns today for follow up, accompanied by her husband who supplements the history.  She is back on mirapex, 1.5 mg daily.  She did not do well on the requip and was pretty insistent on going back on the mirapex despite previous convulsive behaviors.  She wants to go off levodopa 25/100, and she is on it 1/2 bid but often forgets the second dose.  She is able to drive again.   She states that she thinks that she needs to go back up to the 3.0 of the mirapex.  Her husband is not now completely convinced that the mirapex caused compulsive shopping but thinks that boredom did that.  They are getting ready to go on a 14 day [employer] cruise at the end of October.   07/29/14 update:  Pt returns for f/u, accompanied by her husband who supplements the history.  She is now up to 3.0 mg of mirapex ER a day.   She states that she doesn't feel as good as she did when she was on it previously, but she is doing better than she was on the previous medications.  She has some trouble getting off of the sofa.  No falls.  Is doing circuit II class for PD as well as [employer] but this only takes up 2  days a week and admits isn't doing CV work the remainder of the days.  Husband asks about finding a counselor for her anxiety, which is primarily associated with driving.  Her compulsive shopping has not gotten worse with higher dose of mirapex (feels that it is better as they just got off a cruise and she was previously shopping for the cruise)  Neuroimaging has previously been performed.  It is available for my review today.  It was done in 2010.  There was mild small vessel disease.  The formal report is as follows:    MRI HEAD WITHOUT CONTRAST      Findings:    Gray-white matter differentiation is normal.  The sella is appropriate in signal and morphology.  Clival signal is intact. Cerebellar tonsils are at the level foramen magnum.  Odontoid process, the predental space and the prevertebral soft tissues are within normal limits.    Scattered lymph nodes are seen in the post triangle regions bilaterally, the largest being approximately 9 .1 mm on the left.    No abnormalities seen on diffusion weighted images.    A few scattered punctate foci of signal hyperintensity are seen in the subcortical white matter bifrontally, and to a lesser degree the biparietal subcortical white matter. Discrete small T2 and FLAIR hyperintensities are seen in the biparietal subcortical white matter adjacent to the occipital horns.    The ventricles are normal.  Mastoids are clear.  Internal auditory canals are symmetrical in signal and morphology.  Flow voids are maintained in the major vessels at the cranial skull base.    Moderate inflammatory thickening of the mucosa in the ethmoids and the maxillary sinuses is seen.  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 No  blood breakdown products  seen on SPGR sequences.    . IMPRESSION: 1.  No evidence of acute ischemia. 2.  Non specific subcortical  white matter changes supratentorially .  These may reflect areas of ischemic gliosis related to small vessel disease due to hypertension and/or diabetes versus less likely a demyelinating process or vasculitis. 3.  Prominent perivascular spaces versus lacunes involving the parietal subcortical white matter. 4.  Inflammatory thickening of the mucosa in the ethmoids and the maxillary sinuses.    PREVIOUS MEDICATIONS: Mirapex and azilect; levodopa for a few weeks to "prove" benefit; requip  ALLERGIES:   Allergies  Allergen Reactions  . Sporanox [Itraconazole]   . Tylenol [Acetaminophen]     CURRENT MEDICATIONS:  Current Outpatient Prescriptions on File Prior to Visit  Medication Sig Dispense Refill  . ALPRAZolam (XANAX) 0.25 MG tablet Take 0.25 mg by mouth 1 day or 1 dose.    Marland Kitchen aspirin 325 MG tablet Take 325 mg by mouth daily.    Marland Kitchen atorvastatin (LIPITOR) 10 MG tablet Take 10 mg by mouth daily.    . cholecalciferol (VITAMIN D) 1000 UNITS tablet Take 1,000 Units by mouth daily. Two daily    . clonazePAM (KLONOPIN) 1 MG tablet Take 1 tablet (1 mg total) by mouth at bedtime. 90 tablet 1  . co-enzyme Q-10 30 MG capsule Take 30 mg by mouth daily. Taking 60mg     . CYMBALTA 60 MG capsule Take 1 capsule (60 mg total) by mouth daily. 90 capsule 3  . hydrochlorothiazide (HYDRODIURIL) 25 MG tablet Take 25 mg by mouth daily.    . Magnesium 250 MG TABS Take by mouth 2 (two) times daily.    . Multiple Vitamins-Minerals (ICAPS PO) Take by mouth.    . raloxifene (EVISTA) 60 MG tablet Take 60 mg by mouth daily.     No current facility-administered medications on file prior to visit.    PAST MEDICAL HISTORY:   Past Medical History  Diagnosis Date  . Osteopenia   . Hypertension   . Parkinson disease   . Fibromyalgia   . Hypercholesteremia     controlled on medication  . Melanoma     PAST SURGICAL HISTORY:   Past Surgical History  Procedure Laterality Date  . Tonsillectomy    . Tubal  ligation      SOCIAL HISTORY:   History   Social History  . Marital Status: Married    Spouse Name: N/A    Number of Children: N/A  . 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 Smoking status: Former Smoker    Quit date: 09/29/1997  . Smokeless tobacco: Never Used     Comment: chews Nicorette, 10-12/day  . Alcohol Use: Yes     Comment: Occ  . Drug Use: No  . Sexual Activity: Not on file   Other Topics Concern  . Not on file   Social History Narrative    FAMILY HISTORY:   Family Status  Relation Status Death Age  . Mother Deceased     colon CA  . Father Alive     healthy  . Brother Alive     healthy  . Sister Alive     4, healthy  . Child Alive     healthy    ROS:  A complete 10 system review of systems was obtained and was unremarkable apart from what is mentioned above.  PHYSICAL  EXAMINATION:    VITALS:   Filed Vitals:   07/29/14 1521  BP: 110/74  Pulse: 84  Height: 5' 4.5" (1.638 m)  Weight: 189 lb (85.73 kg)    GEN:  The patient appears stated age and is in NAD. HEENT:  Normocephalic, atraumatic.  The mucous membranes are moist. The superficial temporal arteries are without ropiness or tenderness. CV:  RRR Lungs:  CTAB Neck/HEME:  There are no carotid bruits bilaterally.  Neurological examination:  Orientation: The patient is alert and oriented x3. Fund of knowledge is appropriate.  Recent and remote memory are intact.  Attention and concentration are normal.    Able to name objects and repeat phrases.  Movement examination: Tone: There is mod increased tone in the LUE and normal on the right Abnormal movements: Rare tremor in the LLE. Coordination:  There is minimal decreased hand opening and closing and decreased finger taps on the left. Gait and Station: The patient has no difficulty arising out of a deep-seated chair without the use of the hands. The patient's  stride length is good today.  She has a negative pull test.  LABS:  Lab Results  Component Value Date   TSH 0.80 01/15/2013   Lab Results  Component Value Date   HQPRFFMB84 665 01/15/2013    SPEP/UPEP negative.    Lab Results  Component Value Date   FOLATE 22.1 01/15/2013     ASSESSMENT/PLAN:  1.  Parkinsonism.  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    -We discussed the diagnosis as well as pathophysiology of the disease.  We discussed treatment options as well as prognostic indicators.  Patient education was provided.  -Increase mirapex er to 3.75 mg daily (has a lot of 3.0 mg at home so will just send in the 0.75 mg)  She and her husband both understand the increased risk of compulsive behaviors (shopping in her case) as the dose gets higher.  -We again talked about the importance of exercise.  She is now enrolled in the [place] bicycling program.  Want to see her do CV exercise at least 4 days a week 2.  probable peripheral neuropathy, idiopathic.  -She does have physical examination evidence of this.  Her laboratory workup has been unremarkable.  She is on Cymbalta, although that is primarily for fibromyalgia. 3.  history of melanoma.  -There is some old literature that suggests that the addition of levodopa can increase risk of return melanoma, but new literature suggests that this is not the etiology.   -She just had her skin exam in 03/2014 4.  history of tobacco abuse.  -While the patient has given up smoking, she is using quite a bit of Nicorette gum per day.  I talked to her about the fact that she really needs to stop this as well.  She is just not ready, stating that she has done this since 2010 and doesn't feel the need to quit. 5.  Insomnia.  -She is doing fine with clonazepam.  She does not use it nightly. 6.  Anxiety, situational (with driving)  -talked about CBT.  She wishes to try her old counselor first and will let me  know if that doesn't work out and I can provide referral 7.  Return in about 3 months (around 10/28/2014).

## 2014-08-11 ENCOUNTER — Encounter: Payer: Self-pay | Admitting: Neurology

## 2014-08-28 ENCOUNTER — Ambulatory Visit: Payer: Medicare Other | Attending: Neurology | Admitting: Physical Therapy

## 2014-08-28 ENCOUNTER — Ambulatory Visit: Payer: Medicare Other | Admitting: Occupational Therapy

## 2014-08-28 ENCOUNTER — Ambulatory Visit: Payer: Medicare Other

## 2014-08-28 DIAGNOSIS — R258 Other abnormal involuntary movements: Secondary | ICD-10-CM

## 2014-08-28 DIAGNOSIS — R279 Unspecified lack of coordination: Secondary | ICD-10-CM

## 2014-08-28 DIAGNOSIS — R269 Unspecified abnormalities of gait and mobility: Secondary | ICD-10-CM

## 2014-08-28 DIAGNOSIS — R471 Dysarthria and anarthria: Secondary | ICD-10-CM

## 2014-08-28 NOTE — Therapy (Signed)
[facility] [address] [place], [place], [address] Phone: [phone]   Fax:  [phone]  Patient Details  Name: Morgan [name] MRN: [id number] Date of Birth: February 12, 1951  Encounter Date: 08/28/2014  Physical Therapy Parkinson's Disease Screen   Timed Up and Go test:10.85 seconds   10 meter walk test:3.1 ft/sec  5 time sit to stand test:10.27 seconds   Patient does not require Physical Therapy services at this time.  Pt has an appointment with Dr. Carles Collet in March and patient will plan to request PT, OT, and speech therapy orders from her at that visit.   Morgan [name] W. 08/28/2014, 10:57 AM   [name], PT 08/28/2014 12:28 PM Phone: [phone] Fax: [facility] [address] [place], [place], [address] Phone: [phone]   Fax:  [phone]

## 2014-08-28 NOTE — Therapy (Signed)
[facility] [address] [place], [place], [address] Phone: [phone]   Fax:  [phone]  Patient Details  Name: Tanairi Cypert MRN: [id number] Date of Birth: 26-Apr-1951  Encounter Date: 08/28/2014   Occupational Therapy Parkinson's Disease Screen  Physical Performance Test item #2 (simulated eating):  9.44sec  Physical Performance Test item #4 (donning/doffing jacket):  17.34sec  9-hole peg test:    RUE  24.41sec        LUE  28.03sec  Box & Blocks Test:   RUE  45 blocks        LUE 40 blocks  Change in ability to perform ADLs/IADLs:  Pt reports difficulty putting on socks.  Other Comments:  Decline in coordination per 9-hole peg test and box and blocks test scores.  Pt would benefit from occupational therapy evaluation due to decreased coordination and change in ADLs.  However, pt requested evaluation in 3-4 months.  Therefore, recommended pt request occupational therapy referral from physician at next scheduled appointment, which is in March 2016.    [facility], OTR/L 08/28/2014, 10:39 AM  [facility] [address] [place], [place], [address] Phone: [phone]   Fax:  [phone]

## 2014-08-28 NOTE — Therapy (Signed)
[facility] [address] [place], [place], [address] Phone: [phone]   Fax:  [phone]  Patient Details  Name: Morgan [name] MRN: [id number] Date of Birth: Jun 06, 1951  Encounter Date: 08/28/2014  Speech Therapy Parkinson's Disease Screen  Date: 08-28-14    Decibel Level today: 70dB  (WNL=70-72 dB) with sound level meter 30cm away from pt's mouth  Pt has/has not experienced difficulty in swallowing warranting objective evaluation  Pt does does not require speech therapy services at this time. Recommend ST screen in another 3-4 months. Therapy likely at that time.  Discussed the need to complete loud /a/ x5, twice a day. Pt agreed to do so.   [facility], [place] 08/28/2014, 10:27 AM  [facility] [address] [place], [place], [address] Phone: [phone]   Fax:  [phone]

## 2014-09-26 ENCOUNTER — Other Ambulatory Visit: Payer: Self-pay | Admitting: Gastroenterology

## 2014-09-26 DIAGNOSIS — D123 Benign neoplasm of transverse colon: Secondary | ICD-10-CM | POA: Diagnosis not present

## 2014-09-26 DIAGNOSIS — K648 Other hemorrhoids: Secondary | ICD-10-CM | POA: Diagnosis not present

## 2014-09-26 DIAGNOSIS — Z1211 Encounter for screening for malignant neoplasm of colon: Secondary | ICD-10-CM | POA: Diagnosis not present

## 2014-09-26 DIAGNOSIS — K5730 Diverticulosis of large intestine without perforation or abscess without bleeding: Secondary | ICD-10-CM | POA: Diagnosis not present

## 2014-09-26 DIAGNOSIS — Z800 Family history of malignant neoplasm of digestive organs: Secondary | ICD-10-CM | POA: Diagnosis not present

## 2014-09-26 DIAGNOSIS — D126 Benign neoplasm of colon, unspecified: Secondary | ICD-10-CM | POA: Diagnosis not present

## 2014-10-20 ENCOUNTER — Other Ambulatory Visit: Payer: Self-pay | Admitting: Neurology

## 2014-10-20 MED ORDER — CLONAZEPAM 1 MG PO TABS: 1.0000 mg | ORAL_TABLET | Freq: Every day | ORAL | Status: DC

## 2014-10-20 NOTE — Telephone Encounter (Signed)
Clonazepam refill requested. Per last office note- patient to remain on medication. Refill approved and faxed to patient's pharmacy- [facility] at [phone] with confirmation received.

## 2014-10-28 ENCOUNTER — Ambulatory Visit: Payer: Medicare Other | Admitting: Neurology

## 2014-11-04 ENCOUNTER — Encounter: Payer: Self-pay | Admitting: Neurology

## 2014-11-04 ENCOUNTER — Ambulatory Visit (INDEPENDENT_AMBULATORY_CARE_PROVIDER_SITE_OTHER): Payer: Medicare Other | Admitting: Neurology

## 2014-11-04 VITALS — BP 116/78 | HR 88 | Ht 65.0 in | Wt 188.0 lb

## 2014-11-04 DIAGNOSIS — F411 Generalized anxiety disorder: Secondary | ICD-10-CM

## 2014-11-04 DIAGNOSIS — G20 Parkinson's disease: Secondary | ICD-10-CM

## 2014-11-04 DIAGNOSIS — M797 Fibromyalgia: Secondary | ICD-10-CM | POA: Diagnosis not present

## 2014-11-04 MED ORDER — PRAMIPEXOLE DIHYDROCHLORIDE ER 3 MG PO TB24: 3.0000 mg | ORAL_TABLET | Freq: Every day | ORAL | Status: DC

## 2014-11-04 MED ORDER — CLONAZEPAM 1 MG PO TABS: 1.0000 mg | ORAL_TABLET | Freq: Every day | ORAL | Status: DC

## 2014-11-04 NOTE — Progress Notes (Signed)
Morgan [name] was seen today in the movement disorders clinic for neurologic consultation at the request of Dr. [name] at [facility] internal medicine.  She is accompanied by her husband who helps to supplement the history.  She has previously seen Dr. neurology, both Dr. Krista Blue and Dr. Doy Mince.  I reviewed some of these notes.  The consultation is for the evaluation of Parkinson's disease.  The patient is a 64 y.o. right handed female reports that her first sx was "active dreaming."  However, the symptom that got her to the dr was L greater than R hand tremor, only when moving hands/drying hair.  This was in 2010.  She was started on carbidopa/levodopa just as a "trial" for 2 weeks and then it was stopped because it worked.  She was told that it was diagnostic of PD.  She was then started on the Mirapex ER 1.5 mg in 07/2009. and it was worked up to 3.0 mg.  She reports compulsive shopping (she overspends her "allowance).  She mostly internets shops.  She has always shopped but she has previously not gone over the "allowance."  She plays the lottery and plays more than previous.  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  She tried to get off the medication but it was unsuccessful because of worsening of PD.  She was placed on Azilect.  She has been on it for about 2 years.  She has not noted any benefits.    It was her impression that it made the mirapex last longer.  She feels strongly that the computer work and shopping became an issue because of the azilect.   04/19/13 update:  The patient presents today with her husband who supplements the history.  Once she discontinued the Azilect, she felt that her compulsive shopping got better.  Her husband feels that she is not exercising enough, but the patient estimates that she perhaps exercises 4 days per week, but not to the intensity that her husband would like.  She had one near fall since our last visit, but she was on a trail in [place] with 2 walking sticks and  one of them something to the ground and her husband caught her.  She is still taking clonazepam at night.  Her husband thinks that she takes it too early and falls asleep on the couch, but she denies this.  There have not been hallucinations.  There has not been nausea or vomiting.  She has been slower.   08/13/13 update:  The pt is f/u for PD.  She is accompanied by her husband who supplements the hx.  She is on mirapex 3 mg daily and carbidopa/levodopa 25/100 was added last visit.  She notices that she has more energy and can do more things, although her husband states that she does not necessarily do those things (including exercise). She noted that the feet "wobble" since being on the medication.  She also notices that her shopping "addiction" has returned.  She has had no falls.  No hallucinations.  No near syncope.  09/24/13 update:  The pt is f/u regarding PD.  She is accompanied by her husband who helps to supplement the history.  Last visit, I decreased her pramipexole to 2.25 mg daily secondary to compulsive shopping.  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  They  both agree that this could be pathologic.  She is now on carbidopa/levodopa 25/100, half a tablet 4 times per day.  Dyskinesia in the foot is better.  She is just getting over the flu.  No hallucinations.  She has not started exercising faithfully, primarily because she has not been feeling well.  No falls.    12/24/13 update:  The patient is following up in regards to her Parkinson's disease.  She is accompanied by her husband who helps to supplement the history.  Her Mirapex has been reduced to 1.5 mg daily secondary to compulsive behaviors.  She did call and ask if she could increase it, but I did not want to do that.  She is still having compulsive behaviors (shopping) and her  husband would like her to discontinue the medication altogether.  She is currently on carbidopa/levodopa 25/100, half a tablet 4 times per day.  She takes at approximately 8 AM, noon, 4 PM and 9 PM.  Unfortunately, the patient had dyskinesia at higher dosages.  Nonetheless, she states that she would rather have the dyskinesia than feel like she currently does.  She feels slow and achy and just does not feel like doing anything.  She was not having any hallucinations.  She does state that she signed up for a 5K in January and would like to start training for that.  She is not having any hallucinations.  No falls.  She is still on Cymbalta, 60 mg daily as well as clonazepam for insomnia.  01/16/14 update:  The patient is accompanied by her husband who helps to supplement the history.  Last visit, we started the patient on Rytary, and she is currently on 145 mg 3 times a day.  Unfortunately, the patient reports that she had insomnia on the medication and felt "woozy" and really overall felt better on the carbidopa/levodopa IR and would like to go back to that medication, even though she dyskinesia on low dose.  She asks me if she can wean the Mirapex and restart the levodopa.  [facility].  She is planning on working up to running a 5K at [employer] in January, 2016.  She remains on the Cymbalta for her fibromyalgia.  03/20/14 update:  The patient returns today as a work in, accompanied by her husband who supplements the history.  Much has happened since our last visit.  Last visit, I tried to decrease the patient's Mirapex ER to 0.75 mg daily, primarily because of compulsive behaviors.  I started her on levodopa.  She called me back and wanted to go up on the Mirapex because of cramping in the thigh, but instead I increased her levodopa to 4 times a day.  The cramping did get better, but she called later with dyskinesia.  At that time my partner  was on call and she reluctantly increased the patient's Mirapex back to 1.5 mg daily and decreased patient's levodopa to twice a day dosing.  The patient later called me with continued dyskinesias and wanted to go further up on the Mirapex, but I have been resistant because of her compulsive spending in the past.  Instead, on 03/07/2014 I asked her to try amantadine and called in a prescription for this.  Unfortunately, she stated that she read the side effects and decided that she was not going to pick this up and take it.  She is now only on Mirapex 1.5  mg daily and levodopa 25/100, one tablet in the morning and half tablet at night.  She would like to go back up on the Mirapex and her husband states that if he has to disable the computer to deal with her compulsive shopping then he will do that.  04/21/14 update:  Pt returns today for follow up, accompanied by her husband who supplements the history.  She is back on mirapex, 1.5 mg daily.  She did not do well on the requip and was pretty insistent on going back on the mirapex despite previous convulsive behaviors.  She wants to go off levodopa 25/100, and she is on it 1/2 bid but often forgets the second dose.  She is able to drive again.   She states that she thinks that she needs to go back up to the 3.0 of the mirapex.  Her husband is not now completely convinced that the mirapex caused compulsive shopping but thinks that boredom did that.  They are getting ready to go on a 14 day [employer] cruise at the end of October.   07/29/14 update:  Pt returns for f/u, accompanied by her husband who supplements the history.  She is now up to 3.0 mg of mirapex ER a day.   She states that she doesn't feel as good as she did when she was on it previously, but she is doing better than she was on the previous medications.  She has some trouble getting off of the sofa.  No falls.  Is doing circuit II class for PD as well as [employer] but this only takes up 2  days a week and admits isn't doing CV work the remainder of the days.  Husband asks about finding a counselor for her anxiety, which is primarily associated with driving.  Her compulsive shopping has not gotten worse with higher dose of mirapex (feels that it is better as they just got off a cruise and she was previously shopping for the cruise)  11/04/14 update:  Pt returns for f/u, accompanied by her husband who supplements the history.  She did the [employer] since our last visit.  She does state that she was the last one in but she was able to finish.  I had got a call from the rehab unit that the patient had wanted to hold therapy because of fibromyalgia but the patient states that she wanted to continue therapy and perhaps she cancelled one day and not the entire therapy.  She asks me to renew the order.  Just a few days ago (2 nights) the patient backed down on the mirapex from 3.75 mg to 3.0 mg due to EDS and she wasn't sure if it was due to  the mirapex so she wanted to try the lower dosage.  She would like to get a scooter and asks me about that today  Neuroimaging has previously been performed.  It is available for my review today.  It was done in 2010.  There was mild small vessel disease.  The formal report is as follows:    MRI HEAD WITHOUT CONTRAST      Findings:    Gray-white matter differentiation is normal.  The sella is appropriate in signal and morphology.  Clival signal is intact. Cerebellar tonsils are at the level foramen magnum.  Odontoid process, the predental space and the prevertebral soft tissues are within normal limits.    Scattered lymph nodes are seen in the post triangle regions  bilaterally, the largest being approximately 9 .1 mm on the left.    No abnormalities seen on diffusion weighted images.    A few scattered punctate foci of signal hyperintensity are seen in the subcortical white matter bifrontally, and to a lesser degree the biparietal subcortical white  matter. Discrete small T2 and FLAIR hyperintensities are seen in the biparietal subcortical white matter adjacent to the occipital horns.    The ventricles are normal.  Mastoids are clear.  Internal auditory canals are symmetrical in signal and morphology.  Flow voids are maintained in the major vessels at the cranial skull base.    Moderate inflammatory thickening of the mucosa in the ethmoids and the maxillary sinuses is seen.  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 No  blood breakdown products seen on SPGR sequences.    . IMPRESSION: 1.  No evidence of acute ischemia. 2.  Non specific subcortical white matter changes supratentorially .  These may reflect areas of ischemic gliosis related to small vessel disease due to hypertension and/or diabetes versus less likely a demyelinating process or vasculitis. 3.  Prominent perivascular spaces versus lacunes involving the parietal subcortical white matter. 4.  Inflammatory thickening of the mucosa in the ethmoids and the maxillary sinuses.    PREVIOUS MEDICATIONS: Mirapex and azilect; levodopa for a few weeks to "prove" benefit; requip  ALLERGIES:   Allergies  Allergen Reactions  . Sporanox [Itraconazole]   . Tylenol [Acetaminophen]     CURRENT MEDICATIONS:  Current Outpatient Prescriptions on File Prior to Visit  Medication Sig Dispense Refill  . ALPRAZolam (XANAX) 0.25 MG tablet Take 0.25 mg by mouth 1 day or 1 dose.    Marland Kitchen aspirin 325 MG tablet Take 325 mg by mouth daily.    Marland Kitchen atorvastatin (LIPITOR) 10 MG tablet Take 10 mg by mouth daily.    . cholecalciferol (VITAMIN D) 1000 UNITS tablet Take 1,000 Units by mouth daily. Two daily    . co-enzyme Q-10 30 MG capsule Take 30 mg by mouth daily. Taking 60mg     . CYMBALTA 60 MG capsule Take 1 capsule (60 mg total) by mouth daily. 90 capsule 3  . hydrochlorothiazide (HYDRODIURIL)  25 MG tablet Take 25 mg by mouth daily.    . Magnesium 250 MG TABS Take by mouth 2 (two) times daily.    . Multiple Vitamins-Minerals (ICAPS PO) Take by mouth.    . Pramipexole Dihydrochloride 0.75 MG TB24 Take 1 tablet (0.75 mg total) by mouth daily. (Patient not taking: Reported on 11/04/2014) 90 tablet 1  . raloxifene (EVISTA) 60 MG tablet Take 60 mg by mouth daily.     No current facility-administered medications on file prior to visit.    PAST MEDICAL HISTORY:   Past Medical History  Diagnosis Date  . Osteopenia   . Hypertension   . Parkinson disease   . Fibromyalgia   . Hypercholesteremia     controlled on medication  . Melanoma     PAST SURGICAL HISTORY:   Past Surgical History  Procedure Laterality Date  . Tonsillectomy    . Tubal ligation      SOCIAL HISTORY:   History   Social History  . Marital Status: Married    Spouse Name: N/A  . Number of Children: N/A  . Years of Education: N/A   Occupational History  . 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 Smoking status: Former Smoker    Quit date: 09/29/1997  . Smokeless tobacco: Never Used     Comment: chews Nicorette, 10-12/day  . Alcohol Use: Yes     Comment: Occ  . Drug Use: No  . Sexual Activity: Not on file   Other Topics Concern  . Not on file   Social History Narrative    FAMILY HISTORY:   Family Status  Relation Status Death Age  . Mother Deceased     colon CA  . Father Alive     healthy  . Brother Alive     healthy  . Sister Alive     4, healthy  . Child Alive     healthy    ROS:  A complete 10 system review of systems was obtained and was unremarkable apart from what is mentioned above.  PHYSICAL EXAMINATION:    VITALS:   Filed Vitals:   11/04/14 1512  BP: 116/78  Pulse: 88  Height: 5\' 5"  (1.651 m)  Weight: 188 lb (85.276 kg)    GEN:  The patient appears stated age and is in NAD. HEENT:  Normocephalic, atraumatic.  The mucous membranes  are moist. The superficial temporal arteries are without ropiness or tenderness.   Neurological examination:  Orientation: The patient is alert and oriented x3. Fund of knowledge is appropriate.  Recent and remote memory are intact.  Attention and concentration are normal.    Able to name objects and repeat phrases.  Movement examination: Tone: There is mild-mod increased tone in the LUE and normal on the right Abnormal movements: No tremor noted today Coordination:  There is good finger taps on the left and slight decremation with heel taps on the left today.  Good rapid alternating movements on the right. Gait and Station: The patient has no difficulty arising out of a deep-seated chair without the use of the hands. The patient's stride length is good today.  Arm swing is good.  She has a negative pull test.  LABS:  Lab Results  Component Value Date   TSH 0.80 01/15/2013   Lab Results  Component Value Date   [id number] 790 01/15/2013    SPEP/UPEP negative.    Lab Results  Component Value Date   FOLATE 22.1 01/15/2013     ASSESSMENT/PLAN:  1.  Parkinsonism.  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    -We discussed the diagnosis as well as pathophysiology of the disease.  We discussed treatment options as well as prognostic indicators.  Patient education was provided.  -While last visit she wanted to go up on the Mirapex to 3.75 mg, she just recently went back down to 3.0 mg as she thought that the higher dose cause daytime hypersomnolence.  -We again talked about the importance of exercise.  She is now enrolled in the [employer] bicycling program.  Want to see her do CV exercise at least 4 days a week.  Sent a new referral for physical therapy today.  Talked to the patient and her husband about the [employer] challenge which is upcoming.  -filled out handicap driving placard 2.  probable peripheral neuropathy, idiopathic.  -She does have  physical examination evidence of this.  Her laboratory workup has been unremarkable.  She is on Cymbalta, although that is primarily for fibromyalgia. 3.  history of melanoma.  -There is some old literature that suggests that the addition of levodopa can increase risk of  return melanoma, but new literature suggests that this is not the etiology.   -She just had her skin exam in 03/2014 4.  history of tobacco abuse.  -While the patient has given up smoking, she is using quite a bit of Nicorette gum per day.  I talked to her about the fact that she really needs to stop this as well.  She is just not ready, stating that she has done this since 2010 and doesn't feel the need to quit. 5.  Insomnia.  -She is doing fine with clonazepam.  She does not use it nightly.  I sent a new prescription for her clonazepam today. 6.  Anxiety, situational (with driving)  -talked about CBT.  She wishes to try her old counselor first and will let me know if that doesn't work out and I can provide referral 7.  Return in about 3 months (around 02/04/2015).

## 2014-11-04 NOTE — Patient Instructions (Addendum)
Calling all cyclists, runners & walkers to join [place] for our 11th annual event, Saturday April 23rd, 2016.   25 mile bike ride:    adventurous moderate distance for intermediate riders   50 mile bike ride:   challenging and scenic ride to [employer],  plenty of climbing for experienced riders!   10K run:  safe route through a local neighborhood   5K run/walk:  safe route through a local neighborhood  [employer]... Marked routes... Food... Drink... Great rest stop... Sag wagon support!   No "Entry Fee", donations expected & appreciated 100% of all donations will go to PD/PSP research, and Local Families faced with these challenges. Check In Time Opens: 7:30 a.m. Start Times: 50 mile ride 8:30 a.m.  25 mile ride 9:30 a.m.  5K & 10K 10 a.m. Helmets required All participants must sign a waiver of liability on the day of the  event at registration. [place]out following the event If you can't join [place] please consider a tax deductible donation  Make checks payable to: Morgan [name]                                      [address] [place], [place] [address]

## 2014-11-05 ENCOUNTER — Telehealth: Payer: Self-pay | Admitting: Neurology

## 2014-11-05 DIAGNOSIS — G20 Parkinson's disease: Secondary | ICD-10-CM

## 2014-11-05 NOTE — Telephone Encounter (Signed)
Referral placed for PT at [facility].

## 2014-11-17 ENCOUNTER — Telehealth: Payer: Self-pay | Admitting: Neurology

## 2014-11-17 NOTE — Telephone Encounter (Signed)
Patient states we did not put license number on handicap form that Dr [name] signed. She got information from an old form. She will call if needed.

## 2014-11-17 NOTE — Telephone Encounter (Signed)
Pt called wanting to speak to a nurse regarding the form that Dr. Carles Collet filled out on her last visit. Please call her # [phone]

## 2014-11-24 ENCOUNTER — Telehealth: Payer: Self-pay | Admitting: Neurology

## 2014-11-24 MED ORDER — MIRAPEX ER 3 MG PO TB24: 3.0000 mg | ORAL_TABLET | Freq: Every day | ORAL | Status: DC

## 2014-11-24 NOTE — Telephone Encounter (Signed)
Received note from answering service that patient would like RX for Mirapex sent to her mail order pharmacy that is for Brand name only. Order sent.

## 2014-12-10 ENCOUNTER — Telehealth: Payer: Self-pay | Admitting: Neurology

## 2014-12-10 NOTE — Telephone Encounter (Signed)
Spoke with patient and she states she was only referred for PT not OT/ST. Last office note only specifies PT. She expressed understanding and will call with any questions/problems.

## 2014-12-10 NOTE — Telephone Encounter (Signed)
Pt left message on the voice mail she needs to talk with Luvenia Starch about the Speech and Occupational therapy please call her at [phone]

## 2015-01-13 DIAGNOSIS — I10 Essential (primary) hypertension: Secondary | ICD-10-CM | POA: Diagnosis not present

## 2015-01-13 DIAGNOSIS — M859 Disorder of bone density and structure, unspecified: Secondary | ICD-10-CM | POA: Diagnosis not present

## 2015-01-13 DIAGNOSIS — E785 Hyperlipidemia, unspecified: Secondary | ICD-10-CM | POA: Diagnosis not present

## 2015-01-13 DIAGNOSIS — M797 Fibromyalgia: Secondary | ICD-10-CM | POA: Diagnosis not present

## 2015-01-13 DIAGNOSIS — G20 Parkinson's disease: Secondary | ICD-10-CM | POA: Diagnosis not present

## 2015-01-13 DIAGNOSIS — F419 Anxiety disorder, unspecified: Secondary | ICD-10-CM | POA: Diagnosis not present

## 2015-01-14 ENCOUNTER — Ambulatory Visit: Payer: Medicare Other | Attending: Neurology | Admitting: Physical Therapy

## 2015-01-14 DIAGNOSIS — R258 Other abnormal involuntary movements: Secondary | ICD-10-CM | POA: Insufficient documentation

## 2015-01-14 DIAGNOSIS — R269 Unspecified abnormalities of gait and mobility: Secondary | ICD-10-CM | POA: Insufficient documentation

## 2015-01-14 NOTE — Therapy (Signed)
[facility] [address] [place], [place], [address] Phone: [phone]   Fax:  [phone]  Physical Therapy Evaluation  Patient Details  Name: Morgan [name] MRN: [id number] Date of Birth: [birth year] Referring Provider:  [name], MD  Encounter Date: 01/14/2015      PT End of Session - 01/14/15 1843    Visit Number 1   Number of Visits 9   Date for PT Re-Evaluation 03/15/15   Authorization Type Medicare-G-code required 10th visit   PT Start Time 1105   PT Stop Time 1151   PT Time Calculation (min) 46 min   Activity Tolerance Patient tolerated treatment well   Behavior During Therapy [facility] for tasks assessed/performed      Past Medical History  Diagnosis Date  . Osteopenia   . Hypertension   . Parkinson disease   . Fibromyalgia   . Hypercholesteremia     controlled on medication  . Melanoma     Past Surgical History  Procedure Laterality Date  . Tonsillectomy    . Tubal ligation      There were no vitals filed for this visit.  Visit Diagnosis:  Bradykinesia  Abnormality of gait      Subjective Assessment - 01/14/15 1114    Subjective Pt is a 64 year old female who presents to OP PT with Parkinson's disease, with history of neuropathy.  She feels that neuropathy is worsening, which is affecting her balance.  She notes increased slowness with walking, which has affected her especially with [place] at airports.  She reports having one fall in the past 6 months, which occured in backward direction tripping over step stool.  She denies freezing episodes with gait and turns.     Pertinent History Fibromyalgia, neuropathy   Patient Stated Goals Pt's goal for therapy is to walk normal without leg pain.   Currently in Pain? Yes   Pain Score 5    Pain Location Leg   Pain Orientation Right;Left   Pain Descriptors / Indicators Discomfort  muscle firing all the time   Pain Type Chronic pain   Pain  Onset More than a month ago   Pain Frequency Intermittent   Aggravating Factors  Weather   Pain Relieving Factors Cymbalta/muscle relaxers alleviates pain somewhat            [facility] PT Assessment - 01/14/15 1122    Assessment   Medical Diagnosis Parkinson's disease   Precautions   Precautions Fall   Balance Screen   Has the patient fallen in the past 6 months Yes   How many times? 1   Has the patient had a decrease in activity level because of a fear of falling?  No   Is the patient reluctant to leave their home because of a fear of falling?  No   Home Environment   Living Enviornment Private residence   Living Arrangements Spouse/significant other   Available Help at Discharge Family   Type of [place] to enter   Entrance Stairs-Number of Steps 1   Entrance Stairs-Rails None  holds to wall at garage [place] Two level;Full bath on main level;Able to live on main level with bedroom/bathroom   Home Equipment --  walking poles   Prior Function   Level of Independence Independent with basic ADLs;Independent with homemaking with ambulation;Independent with gait;Independent with transfers  Reports slowing with ADLs and gait   Leisure Enjoys reading, walks  around house several times per week; participates in PD-specific exercises twice per week   ROM / Strength   AROM / PROM / Strength Strength   Strength   Strength Assessment Site Ankle   Right/Left Ankle Right;Left   Right Ankle Dorsiflexion 4/5   Left Ankle Dorsiflexion 4/5   Transfers   Transfers Sit to Stand;Stand to Sit   Sit to Stand 5: Supervision;Without upper extremity assist;From chair/3-in-1;Five times sit to stand  5x sit<>stand:  11.30 sec with posterior lean   Sit to Stand Details (indicate cue type and reason) decreased forward lean into standing; has posterior lean   Stand to Sit 5: Supervision;Without upper extremity assist;To chair/3-in-1  uncontrolled descent into sitting    Ambulation/Gait   Ambulation/Gait Yes   Ambulation/Gait Assistance 6: Modified independent (Device/Increase time)   Ambulation Distance (Feet) 200 Feet   Assistive device --  single walking pole   Gait Pattern Step-through pattern;Decreased step length - left;Decreased step length - right;Right flexed knee in stance;Left flexed knee in stance;Decreased trunk rotation;Poor foot clearance - left;Poor foot clearance - right;Decreased dorsiflexion - right;Decreased dorsiflexion - left  decrease arm swing   Ambulation Surface Level;Indoor   Gait velocity 10.06=3.26 ft/sec   Standardized Balance Assessment   Standardized Balance Assessment Timed Up and Go Test;Four Square Step Test   Four Square Step Test Trial One  14.76 sec   Timed Up and Go Test   TUG Normal TUG;Manual TUG;Cognitive TUG   Normal TUG (seconds) 9.92   Manual TUG (seconds) 11.7   Cognitive TUG (seconds) 11.54   Functional Gait  Assessment   Gait assessed  Yes   Gait Level Surface Walks 20 ft in less than 5.5 sec, no assistive devices, good speed, no evidence for imbalance, normal gait pattern, deviates no more than 6 in outside of the 12 in walkway width.   Change in Gait Speed Able to smoothly change walking speed without loss of balance or gait deviation. Deviate no more than 6 in outside of the 12 in walkway width.   Gait with Horizontal Head Turns Performs head turns smoothly with slight change in gait velocity (eg, minor disruption to smooth gait path), deviates 6-10 in outside 12 in walkway width, or uses an assistive device.   Gait with Vertical Head Turns Performs task with slight change in gait velocity (eg, minor disruption to smooth gait path), deviates 6 - 10 in outside 12 in walkway width or uses assistive device   Gait and Pivot Turn Pivot turns safely within 3 sec and stops quickly with no loss of balance.   Step Over Obstacle Is able to step over one shoe box (4.5 in total height) but must slow down and adjust  steps to clear box safely. May require verbal cueing.   Gait with Narrow Base of Support Is able to ambulate for 10 steps heel to toe with no staggering.   Gait with Eyes Closed Walks 20 ft, slow speed, abnormal gait pattern, evidence for imbalance, deviates 10-15 in outside 12 in walkway width. Requires more than 9 sec to ambulate 20 ft.   Ambulating Backwards Walks 20 ft, uses assistive device, slower speed, mild gait deviations, deviates 6-10 in outside 12 in walkway width.  12.52 in 20 ft   Steps Alternating feet, must use rail.   Total Score 22  PT Long Term Goals - 2015-02-08 1849    PT LONG TERM GOAL #1   Title Pt will be independent with HEP for improved transfers, balance, gait.  Target 02/13/15   Time 4   Period Weeks   Status New   PT LONG TERM GOAL #2   Title Pt will perform at least 8 of 10 reps of sit<>stand transfers from 18 inch surfaces or lower no UE support and no posterior lean for improved transfer efficiency.   Time 4   Period Weeks   Status New   PT LONG TERM GOAL #3   Title Pt will improve Four-Square step test to less than or equal to 12 seconds with no loss of balance for improved balance/directional changes.   Time 4   Period Weeks   Status New   PT LONG TERM GOAL #4   Title Sensory Organization test to be assessed, with goal written as appropriate.   Time 4   Period Weeks   Status New   PT LONG TERM GOAL #5   Title Pt will imrpove Functional Gait Assessment score to at least 25/30 for decreased fall risk, improved dynamic gait.   Time 4   Period Weeks   Status New               Plan - February 08, 2015 1844    Clinical Impression Statement Pt is a 64 year old female who is known to this therapist from previous bouts of therapy.  She presents to OP PT with Parkinson's disease, with what she feels is increased slowing of mobility and ADL performance.  She notes one fall in the past 6 months.  She is  noted to have bradykinesia, rigidity, decreasd timing and coordination with gait during evaluation testing.  She presents with increased posterior lean with transfers and slowed gait with head turns/nods and with eyes closed (borderline fall risk on Functional Gait Assessment).  Pt would benefit from skilled physical therapy to address functional strength, transfers, balance, gait training for overall improved functional mobility and decreased fall risk.   Pt will benefit from skilled therapeutic intervention in order to improve on the following deficits Abnormal gait;Decreased balance;Other (comment)  bradykinesia, slowed transfer ability, decreased timing and coordination with gait   Rehab Potential Good   PT Frequency 2x / week   PT Duration 4 weeks  plus eval   PT Treatment/Interventions ADLs/Self Care Home Management;Therapeutic activities;Functional mobility training;Stair training;Gait training;Therapeutic exercise;Balance training;Neuromuscular re-education;Patient/family education   PT Next Visit Plan perform Sensory Organization test; initiate balance exercises according to results; transfers from lower surfaces focusing on forward lean   Recommended Other Services Discussed OT based on pt's c/o slowed performance of ADLs; however, pt requests to wait until after PT is completed   Consulted and Agree with Plan of Care Patient          G-Codes - February 08, 2015 1852    Functional Assessment Tool Used 5x sit<>stand 11.3 seconds with posterior lean; 4-square step test 14.76 sec   Functional Limitation Mobility: Walking and moving around   Mobility: Walking and Moving Around Current Status [phone]) At least 20 percent but less than 40 percent impaired, limited or restricted   Mobility: Walking and Moving Around Goal Status [phone]) At least 1 percent but less than 20 percent impaired, limited or restricted       Problem List Patient Active Problem List   Diagnosis Date Noted  . Fibromyalgia  01/16/2014  . Cholelithiasis 04/25/2013  . Paralysis  agitans 01/15/2013  . Unspecified hereditary and idiopathic peripheral neuropathy 01/15/2013    Tamarius Rosenfield W. 01/14/2015, 6:54 PM  Frazier Butt., PT  [facility] [address] [place], [place], [address] Phone: [phone]   Fax:  [phone]

## 2015-02-03 ENCOUNTER — Ambulatory Visit: Payer: Medicare Other | Admitting: Physical Therapy

## 2015-02-04 ENCOUNTER — Ambulatory Visit: Payer: Federal, State, Local not specified - PPO | Admitting: Neurology

## 2015-02-05 ENCOUNTER — Ambulatory Visit (INDEPENDENT_AMBULATORY_CARE_PROVIDER_SITE_OTHER): Payer: Medicare Other | Admitting: Neurology

## 2015-02-05 ENCOUNTER — Encounter: Payer: Self-pay | Admitting: Neurology

## 2015-02-05 VITALS — BP 140/84 | HR 105 | Ht 65.0 in | Wt 184.1 lb

## 2015-02-05 DIAGNOSIS — M797 Fibromyalgia: Secondary | ICD-10-CM | POA: Diagnosis not present

## 2015-02-05 DIAGNOSIS — G20 Parkinson's disease: Secondary | ICD-10-CM

## 2015-02-05 DIAGNOSIS — G609 Hereditary and idiopathic neuropathy, unspecified: Secondary | ICD-10-CM | POA: Diagnosis not present

## 2015-02-05 MED ORDER — MIRAPEX ER 3.75 MG PO TB24: 1.0000 | ORAL_TABLET | Freq: Every day | ORAL | Status: DC

## 2015-02-05 MED ORDER — CYMBALTA 60 MG PO CPEP: 60.0000 mg | ORAL_CAPSULE | Freq: Every day | ORAL | Status: DC

## 2015-02-05 NOTE — Progress Notes (Signed)
Morgan [name] was seen today in the movement disorders clinic for neurologic consultation at the request of Dr. [name] at [facility] internal medicine.  She is accompanied by her husband who helps to supplement the history.  She has previously seen Dr. neurology, both Dr. Krista Blue and Dr. Doy Mince.  I reviewed some of these notes.  The consultation is for the evaluation of Parkinson's disease.  The patient is a 64 y.o. right handed female reports that her first sx was "active dreaming."  However, the symptom that got her to the dr was L greater than R hand tremor, only when moving hands/drying hair.  This was in 2010.  She was started on carbidopa/levodopa just as a "trial" for 2 weeks and then it was stopped because it worked.  She was told that it was diagnostic of PD.  She was then started on the Mirapex ER 1.5 mg in 07/2009. and it was worked up to 3.0 mg.  She reports compulsive shopping (she overspends her "allowance).  She mostly internets shops.  She has always shopped but she has previously not gone over the "allowance."  She plays the lottery and plays more than previous.  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  She tried to get off the medication but it was unsuccessful because of worsening of PD.  She was placed on Azilect.  She has been on it for about 2 years.  She has not noted any benefits.    It was her impression that it made the mirapex last longer.  She feels strongly that the computer work and shopping became an issue because of the azilect.   04/19/13 update:  The patient presents today with her husband who supplements the history.  Once she discontinued the Azilect, she felt that her compulsive shopping got better.  Her husband feels that she is not exercising enough, but the patient estimates that she perhaps exercises 4 days per week, but not to the intensity that her husband would like.  She had one near fall since our last visit, but she was on a trail in [place] with 2 walking sticks and  one of them something to the ground and her husband caught her.  She is still taking clonazepam at night.  Her husband thinks that she takes it too early and falls asleep on the couch, but she denies this.  There have not been hallucinations.  There has not been nausea or vomiting.  She has been slower.   08/13/13 update:  The pt is f/u for PD.  She is accompanied by her husband who supplements the hx.  She is on mirapex 3 mg daily and carbidopa/levodopa 25/100 was added last visit.  She notices that she has more energy and can do more things, although her husband states that she does not necessarily do those things (including exercise). She noted that the feet "wobble" since being on the medication.  She also notices that her shopping "addiction" has returned.  She has had no falls.  No hallucinations.  No near syncope.  09/24/13 update:  The pt is f/u regarding PD.  She is accompanied by her husband who helps to supplement the history.  Last visit, I decreased her pramipexole to 2.25 mg daily secondary to compulsive shopping.  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  They  both agree that this could be pathologic.  She is now on carbidopa/levodopa 25/100, half a tablet 4 times per day.  Dyskinesia in the foot is better.  She is just getting over the flu.  No hallucinations.  She has not started exercising faithfully, primarily because she has not been feeling well.  No falls.    12/24/13 update:  The patient is following up in regards to her Parkinson's disease.  She is accompanied by her husband who helps to supplement the history.  Her Mirapex has been reduced to 1.5 mg daily secondary to compulsive behaviors.  She did call and ask if she could increase it, but I did not want to do that.  She is still having compulsive behaviors (shopping) and her  husband would like her to discontinue the medication altogether.  She is currently on carbidopa/levodopa 25/100, half a tablet 4 times per day.  She takes at approximately 8 AM, noon, 4 PM and 9 PM.  Unfortunately, the patient had dyskinesia at higher dosages.  Nonetheless, she states that she would rather have the dyskinesia than feel like she currently does.  She feels slow and achy and just does not feel like doing anything.  She was not having any hallucinations.  She does state that she signed up for a 5K in January and would like to start training for that.  She is not having any hallucinations.  No falls.  She is still on Cymbalta, 60 mg daily as well as clonazepam for insomnia.  01/16/14 update:  The patient is accompanied by her husband who helps to supplement the history.  Last visit, we started the patient on Rytary, and she is currently on 145 mg 3 times a day.  Unfortunately, the patient reports that she had insomnia on the medication and felt "woozy" and really overall felt better on the carbidopa/levodopa IR and would like to go back to that medication, even though she dyskinesia on low dose.  She asks me if she can wean the Mirapex and restart the levodopa.  [facility].  She is planning on working up to running a 5K at [employer] in January, 2016.  She remains on the Cymbalta for her fibromyalgia.  03/20/14 update:  The patient returns today as a work in, accompanied by her husband who supplements the history.  Much has happened since our last visit.  Last visit, I tried to decrease the patient's Mirapex ER to 0.75 mg daily, primarily because of compulsive behaviors.  I started her on levodopa.  She called me back and wanted to go up on the Mirapex because of cramping in the thigh, but instead I increased her levodopa to 4 times a day.  The cramping did get better, but she called later with dyskinesia.  At that time my partner  was on call and she reluctantly increased the patient's Mirapex back to 1.5 mg daily and decreased patient's levodopa to twice a day dosing.  The patient later called me with continued dyskinesias and wanted to go further up on the Mirapex, but I have been resistant because of her compulsive spending in the past.  Instead, on 03/07/2014 I asked her to try amantadine and called in a prescription for this.  Unfortunately, she stated that she read the side effects and decided that she was not going to pick this up and take it.  She is now only on Mirapex 1.5  mg daily and levodopa 25/100, one tablet in the morning and half tablet at night.  She would like to go back up on the Mirapex and her husband states that if he has to disable the computer to deal with her compulsive shopping then he will do that.  04/21/14 update:  Pt returns today for follow up, accompanied by her husband who supplements the history.  She is back on mirapex, 1.5 mg daily.  She did not do well on the requip and was pretty insistent on going back on the mirapex despite previous convulsive behaviors.  She wants to go off levodopa 25/100, and she is on it 1/2 bid but often forgets the second dose.  She is able to drive again.   She states that she thinks that she needs to go back up to the 3.0 of the mirapex.  Her husband is not now completely convinced that the mirapex caused compulsive shopping but thinks that boredom did that.  They are getting ready to go on a 14 day [employer] cruise at the end of October.   07/29/14 update:  Pt returns for f/u, accompanied by her husband who supplements the history.  She is now up to 3.0 mg of mirapex ER a day.   She states that she doesn't feel as good as she did when she was on it previously, but she is doing better than she was on the previous medications.  She has some trouble getting off of the sofa.  No falls.  Is doing circuit II class for PD as well as [employer] but this only takes up 2  days a week and admits isn't doing CV work the remainder of the days.  Husband asks about finding a counselor for her anxiety, which is primarily associated with driving.  Her compulsive shopping has not gotten worse with higher dose of mirapex (feels that it is better as they just got off a cruise and she was previously shopping for the cruise)  11/04/14 update:  Pt returns for f/u, accompanied by her husband who supplements the history.  She did the [employer] since our last visit.  She does state that she was the last one in but she was able to finish.  I had got a call from the rehab unit that the patient had wanted to hold therapy because of fibromyalgia but the patient states that she wanted to continue therapy and perhaps she cancelled one day and not the entire therapy.  She asks me to renew the order.  Just a few days ago (2 nights) the patient backed down on the mirapex from 3.75 mg to 3.0 mg due to EDS and she wasn't sure if it was due to  the mirapex so she wanted to try the lower dosage.  She would like to get a scooter and asks me about that today  02/05/15 update:  The patient is accompanied by her husband who supplements the history.  She is on Mirapex ER 3.0 mg daily.  She thinks that she wants to try again potentially going up on it.  She feels a little slower than usual.  She is doing PWR classes.     She denies any falls.  No hallucinations.  No lightheadedness or near syncope.  No dyskinesia.  She is supposed to be on Cymbalta for both peripheral neuropathy and fibromyalgia.  She ran out and is now not doing well.  She has been out of it for 2  weeks.  The patient thinks that her fibromyalgia got out of control, she quit exercising so vigorously and then her Parkinson's got worse.  She takes clonazepam as needed for insomnia (3-4 days per week).  They're planning several cruises and several vacations in the near future.    PREVIOUS MEDICATIONS: Mirapex and azilect; levodopa for a few weeks to  "prove" benefit; requip  ALLERGIES:   Allergies  Allergen Reactions  . Sporanox [Itraconazole]   . Tylenol [Acetaminophen]     CURRENT MEDICATIONS:  Current Outpatient Prescriptions on File Prior to Visit  Medication Sig Dispense Refill  . ALPRAZolam (XANAX) 0.25 MG tablet Take 0.25 mg by mouth 1 day or 1 dose.    Marland Kitchen aspirin 325 MG tablet Take 325 mg by mouth daily.    Marland Kitchen atorvastatin (LIPITOR) 10 MG tablet Take 10 mg by mouth daily.    . cholecalciferol (VITAMIN D) 1000 UNITS tablet Take 1,000 Units by mouth daily. Two daily    . clonazePAM (KLONOPIN) 1 MG tablet Take 1 tablet (1 mg total) by mouth at bedtime. 90 tablet 1  . co-enzyme Q-10 30 MG capsule Take 30 mg by mouth daily. Taking 60mg     . hydrochlorothiazide (HYDRODIURIL) 25 MG tablet Take 25 mg by mouth daily.    . Magnesium 250 MG TABS Take by mouth 2 (two) times daily.    . Multiple Vitamins-Minerals (ICAPS PO) Take by mouth.    . raloxifene (EVISTA) 60 MG tablet Take 60 mg by mouth daily.     No current facility-administered medications on file prior to visit.    PAST MEDICAL HISTORY:   Past Medical History  Diagnosis Date  . Osteopenia   . Hypertension   . Parkinson disease   . Fibromyalgia   . Hypercholesteremia     controlled on medication  . Melanoma     PAST SURGICAL HISTORY:   Past Surgical History  Procedure Laterality Date  . Tonsillectomy    . Tubal ligation      SOCIAL HISTORY:   History   Social History  . Marital Status: Married    Spouse Name: N/A  . Number of Children: N/A  . 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 Smoking status: Former Smoker    Quit date: 09/29/1997  . Smokeless tobacco: Never Used     Comment: chews Nicorette, 10-12/day  . Alcohol Use: Yes     Comment: Occ  . Drug Use: No  . Sexual Activity: Not on file   Other Topics Concern  . Not on file   Social History Narrative     FAMILY HISTORY:   Family Status  Relation Status Death Age  . Mother Deceased     colon CA  . Father Alive     healthy  . Brother Alive     healthy  . Sister Alive     4, healthy  . Child Alive     healthy    ROS:  A complete 10 system review of systems was obtained and was unremarkable apart from what is mentioned above.  PHYSICAL EXAMINATION:    VITALS:   Filed Vitals:   02/05/15 1302  BP: 140/84  Pulse: 105  Height: 5\' 5"  (1.651 m)  Weight: 184 lb 2 oz (83.519 kg)  SpO2: 99%    GEN:  The patient appears stated age and is in NAD. HEENT:  Normocephalic, atraumatic.  The mucous membranes are moist. The superficial temporal arteries are without ropiness or tenderness.   Neurological examination:  Orientation: The patient is alert and oriented x3. Fund of knowledge is appropriate.  Recent and remote memory are intact.  Attention and concentration are normal.    Able to name objects and repeat phrases.  Movement examination: Tone: There is moderate to severe increased tone in the left upper and lower extremity.  There is mild to moderate increased tone in the right upper extremity. Abnormal movements: No tremor noted today Coordination:  There is decreased rapid alternating movements on the left. Gait and Station: The patient has no difficulty arising out of a deep-seated chair without the use of the hands. The patient's stride length is good today.  Arm swing is good.  She has a negative pull test.  LABS:  Lab Results  Component Value Date   TSH 0.80 01/15/2013   Lab Results  Component Value Date   [id number] 01/15/2013    SPEP/UPEP negative.    Lab Results  Component Value Date   FOLATE 22.1 01/15/2013     ASSESSMENT/PLAN:  1.  Parkinsonism.  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    -We discussed the diagnosis as well as pathophysiology of the disease.  We discussed treatment options as well as  prognostic indicators.  Patient education was provided.  -She is significantly more rigid today.  We previously tried to go up on the Mirapex and she came back down on the dose.  She thinks that perhaps it was because of the generic brand of medication.  She thinks she would tolerate it better if she had a brand name medication and asks if we can go up on the medication to the 3.75 mg dose and try the brand medicine.  I think that is reasonable.  Risks, benefits, side effects and alternative therapies were discussed.  The opportunity to ask questions was given and they were answered to the best of my ability.  The patient expressed understanding and willingness to follow the outlined treatment protocols.  -We again talked about the importance of exercise.  She needs to get reengaged with cardiovascular exercise. 2.  probable peripheral neuropathy, idiopathic.  -She does have physical examination evidence of this.  Her laboratory workup has been unremarkable.  She will restart her Cymbalta, although this is primarily for fibromyalgia.  When she went off of this, her symptoms of fibromyalgia got much worse and she started having more difficulty moving around.  I rewrote her prescription for the Cymbalta, 60 mg daily. 3.  history of melanoma.  -There is some old literature that suggests that the addition of levodopa can increase risk of return melanoma, but new literature suggests that this is not the etiology.   -She just had her skin exam in 03/2014 4.  history of tobacco abuse.  -While the patient has given up smoking, she is using quite a bit of Nicorette gum per day.  I talked to her about the fact that she really needs to stop this as well.  She is just not ready, stating that she has done this since 2010 and doesn't feel the need to quit. 5.  Insomnia.  -She is doing fine with clonazepam.  She does not use it nightly, but does take it about 3 or 4 nights a week.  6.  Anxiety, situational (with  driving)  -talked about CBT.  She does not want  a referral right now 7.  I will see her back in follow-up in the next 3 or 4 months, sooner should new neurologic issues arise.

## 2015-02-06 DIAGNOSIS — E876 Hypokalemia: Secondary | ICD-10-CM | POA: Diagnosis not present

## 2015-02-10 ENCOUNTER — Ambulatory Visit: Payer: Medicare Other | Attending: Neurology | Admitting: Physical Therapy

## 2015-02-10 DIAGNOSIS — R258 Other abnormal involuntary movements: Secondary | ICD-10-CM

## 2015-02-10 DIAGNOSIS — R279 Unspecified lack of coordination: Secondary | ICD-10-CM | POA: Insufficient documentation

## 2015-02-10 DIAGNOSIS — R269 Unspecified abnormalities of gait and mobility: Secondary | ICD-10-CM | POA: Diagnosis not present

## 2015-02-10 NOTE — Therapy (Signed)
[facility] [address] [place], [place], [address] Phone: [phone]   Fax:  [phone]  Physical Therapy Treatment  Patient Details  Name: Morgan [name] MRN: [id number] Date of Birth: 1951-08-11 Referring Provider:  [name], MD  Encounter Date: 02/10/2015      PT End of Session - 02/10/15 1419    Visit Number 2   Number of Visits 9   Date for PT Re-Evaluation 03/15/15   Authorization Type Medicare-G-code required 10th visit   PT Start Time 1150   PT Stop Time 1232   PT Time Calculation (min) 42 min   Activity Tolerance Patient tolerated treatment well   Behavior During Therapy [facility] for tasks assessed/performed  Requires standing rest breaks during SOT      Past Medical History  Diagnosis Date  . Osteopenia   . Hypertension   . Parkinson disease   . Fibromyalgia   . Hypercholesteremia     controlled on medication  . Melanoma     Past Surgical History  Procedure Laterality Date  . Tonsillectomy    . Tubal ligation      There were no vitals filed for this visit.  Visit Diagnosis:  Bradykinesia  Abnormality of gait  Lack of coordination      Subjective Assessment - 02/10/15 1153    Subjective Pt reports feeling tired today after PD exercise class this morning; "feet are killing me" due to neuropathy.   Currently in Pain? Yes   Pain Score 5    Pain Location Foot   Pain Orientation Right;Left   Pain Descriptors / Indicators Aching  "rock" in shoe-due to neuropathy   Pain Type Chronic pain   Pain Frequency Intermittent   Aggravating Factors  too much exercise in the class   Pain Relieving Factors Nothing fully alleviates            [facility] PT Assessment - 02/10/15 1157    Standardized Balance Assessment   Balance Master Testing Sensory Organization Test   Balance Master Testing    Results SOT 56/100 composite score; falls on condition 6 2 of 3 trials; decreased visual and  vestibular system use for balance.   High Level Balance   High Level Balance Comments Corner balance activities-feet apart, feet together with head turns and head nods x 5 reps each, then eyes closed 10 seconds with close supervision                     [facility] Adult PT Treatment/Exercise - 02/10/15 1157    Balance   Balance Assessed Yes   Standardized Balance Assessment   Standardized Balance Assessment Balance Master Testing                PT Education - 02/10/15 1418    Education provided Yes   Education Details Educated patient on results of Sensory Organization test, how it relates to real life/functional activities, and plans to address through exercises   Person(s) Educated Patient   Methods Explanation;Demonstration   Comprehension Verbalized understanding             PT Long Term Goals - 02/10/15 1423    PT LONG TERM GOAL #1   Title Pt will be independent with HEP for improved transfers, balance, gait.  Target 02/13/15.  Updated target 03/15/15 for LTGs   Time 4   Period Weeks   Status On-going   PT LONG TERM GOAL #2   Title Pt will perform  at least 8 of 10 reps of sit<>stand transfers from 18 inch surfaces or lower no UE support and no posterior lean for improved transfer efficiency.   Period Weeks   Status On-going   PT LONG TERM GOAL #3   Title Pt will improve Four-Square step test to less than or equal to 12 seconds with no loss of balance for improved balance/directional changes.   Time 4   Period Weeks   Status On-going   PT LONG TERM GOAL #4   Title Pt will improve Sensory Organization test composite score by 10% for improved overall balance.   Time 4   Period Weeks   Status On-going   PT LONG TERM GOAL #5   Title Pt will imrpove Functional Gait Assessment score to at least 25/30 for decreased fall risk, improved dynamic gait.   Time 4   Period Weeks   Status On-going               Plan - 02/10/15 1420    Clinical  Impression Statement Pt has not returned to PT since evaluation due to scheduling conflict-primary PT out of town, then pt out of town.  Pt returns today, with Sensory Organization test performed.  Pt has decreased overall balance and decreased visual and vestibular system use for balance.  Pt would beneift from further skilled PT to address balance and gait issues.  LTGs remain appropriate; not assessed fullly, as pt has not returned since eval, but will continue these goals for additional 4 weeks.   Pt will benefit from skilled therapeutic intervention in order to improve on the following deficits Abnormal gait;Decreased balance;Other (comment)  decreased timing and coordination with gait   Rehab Potential Good   PT Frequency 2x / week   PT Duration 4 weeks  wk 1 of 4   PT Treatment/Interventions ADLs/Self Care Home Management;Therapeutic activities;Functional mobility training;Stair training;Gait training;Therapeutic exercise;Balance training;Neuromuscular re-education;Patient/family education   PT Next Visit Plan Initiate corner balance exercises on compliant surface, low surface transfers focusing on forward lean        Problem List Patient Active Problem List   Diagnosis Date Noted  . Fibromyalgia 01/16/2014  . Cholelithiasis 04/25/2013  . Paralysis agitans 01/15/2013  . Unspecified hereditary and idiopathic peripheral neuropathy 01/15/2013    Frazier Butt. 02/10/2015, 2:25 PM Frazier Butt., PT [facility] [address] [place], [place], [address] Phone: [phone]   Fax:  [phone]

## 2015-02-11 ENCOUNTER — Ambulatory Visit: Payer: Federal, State, Local not specified - PPO | Admitting: Physical Therapy

## 2015-02-12 ENCOUNTER — Ambulatory Visit: Payer: Medicare Other | Admitting: Physical Therapy

## 2015-02-12 DIAGNOSIS — R279 Unspecified lack of coordination: Secondary | ICD-10-CM | POA: Diagnosis not present

## 2015-02-12 DIAGNOSIS — R258 Other abnormal involuntary movements: Secondary | ICD-10-CM | POA: Diagnosis not present

## 2015-02-12 DIAGNOSIS — R269 Unspecified abnormalities of gait and mobility: Secondary | ICD-10-CM

## 2015-02-12 NOTE — Patient Instructions (Signed)
Feet Apart (Compliant Surface) Head Motion - Eyes Open   With eyes open, standing on compliant surface: _pillow or folded mat_______, feet shoulder width apart, move head slowly: up and down 10 times, then side to side Repeat _10___ times per session. Do _1-2___ sessions per day.  Then perform the same exercise with eyes closed.  Copyright  VHI. All rights reserved.  Feet Together (Compliant Surface) Head Motion - Eyes Open   With eyes open, standing on compliant surface: __pillow or folded mat______, feet together, move head slowly: up and down 10 times, then side to side . Repeat _10___ times per session. Do _1-2___ sessions per day.  Repeat with eyes closed.  Copyright  VHI. All rights reserved.  Weight Shift: Anterior / Posterior (Righting / Equilibrium)   Stand tall with your best posture. Shift weight backward, arms forward and hips back over heels, until toes rise off floor, as you bump the wall. Then use your hip muscles to bring you back to upright standing.  Hold each position _3___ seconds. Repeat _10-15__ times per session. Do _1-2___ sessions per day.  Copyright  VHI. All rights reserved.  Marching in Place: Varied Surfaces   March in place, slowly lifting knees toward ceiling, while standing on the pillow or folded mat surface. Repeat _10___ times per session. Do _1-2___ sessions per day. Copyright  VHI. All rights reserved.    PWR! Moves in Standing Performed on [place] or [place]! Up x 10 -PWR! Rock x 10 -PWR! Twist x 10 -PWR! Step (side, forward) x 10

## 2015-02-12 NOTE — Therapy (Signed)
[facility] [address] [place], [place], [address] Phone: [phone]   Fax:  [phone]  Physical Therapy Treatment  Patient Details  Name: Morgan [name] MRN: [id number] Date of Birth: 05/28/1951 Referring Provider:  [name], MD  Encounter Date: 02/12/2015      PT End of Session - 02/12/15 2112    Visit Number 3   Number of Visits 9   Date for PT Re-Evaluation 03/15/15   Authorization Type Medicare-G-code required 10th visit   PT Start Time 0934   PT Stop Time 1016   PT Time Calculation (min) 42 min   Activity Tolerance Patient tolerated treatment well   Behavior During Therapy [facility] for tasks assessed/performed      Past Medical History  Diagnosis Date  . Osteopenia   . Hypertension   . Parkinson disease   . Fibromyalgia   . Hypercholesteremia     controlled on medication  . Melanoma     Past Surgical History  Procedure Laterality Date  . Tonsillectomy    . Tubal ligation      There were no vitals filed for this visit.  Visit Diagnosis:  Bradykinesia  Abnormality of gait      Subjective Assessment - 02/12/15 0932    Subjective Feel better today than on Tuesday   Currently in Pain? Yes   Pain Score 2    Pain Location Foot   Pain Orientation Right;Left   Pain Descriptors / Indicators Aching   Pain Type Chronic pain   Pain Onset More than a month ago   Aggravating Factors  too much exercise   Pain Relieving Factors lessening activity alleviates                        Neuro Re-education:       Balance Exercises - 02/12/15 0937    Balance Exercises: Standing   Standing Eyes Opened Wide (BOA);Narrow base of support (BOS);Head turns;Solid surface;Foam/compliant surface;Other reps (comment);Other (comment)  10 reps; on two pillows   Standing Eyes Closed Wide (BOA);Narrow base of support (BOS);Head turns;Foam/compliant surface;Solid surface;5 reps  no UE  support; supervision   Wall Bumps Hip  15 reps with tactile cues for incr fwd lean and upright post   Other Standing Exercises On foam:  marching in place, forward kicks, then forward step taps; standing PWR! Moves x 10 reps standing on compliant surface with supervision           PT Education - 02/12/15 2110    Education provided Yes   Education Details HEP-balance/compliant surfaces   Person(s) Educated Patient   Methods Explanation;Demonstration;Handout   Comprehension Verbalized understanding;Returned demonstration             PT Long Term Goals - 02/10/15 1423    PT LONG TERM GOAL #1   Title Pt will be independent with HEP for improved transfers, balance, gait.  Target 02/13/15.  Updated target 03/15/15 for LTGs   Time 4   Period Weeks   Status On-going   PT LONG TERM GOAL #2   Title Pt will perform at least 8 of 10 reps of sit<>stand transfers from 18 inch surfaces or lower no UE support and no posterior lean for improved transfer efficiency.   Period Weeks   Status On-going   PT LONG TERM GOAL #3   Title Pt will improve Four-Square step test to less than or equal to 12 seconds with no loss of balance  for improved balance/directional changes.   Time 4   Period Weeks   Status On-going   PT LONG TERM GOAL #4   Title Pt will improve Sensory Organization test composite score by 10% for improved overall balance.   Time 4   Period Weeks   Status On-going   PT LONG TERM GOAL #5   Title Pt will imrpove Functional Gait Assessment score to at least 25/30 for decreased fall risk, improved dynamic gait.   Time 4   Period Weeks   Status On-going               Plan - 02/12/15 2113    Clinical Impression Statement HEP initiated today for compliant surface balance activities.  Pt able to perform these exercises with increased sway, but without UE support at chair.  Pt demonstrates increased posterior lean with wall bumps.  Pt will continue to benefit from further  skilled PT to address balance, transfers and gait.   Pt will benefit from skilled therapeutic intervention in order to improve on the following deficits Abnormal gait;Decreased balance;Other (comment)  decreased timing and coordination with gait   Rehab Potential Good   PT Frequency 2x / week   PT Duration 4 weeks  wk 1 of 4   PT Treatment/Interventions ADLs/Self Care Home Management;Therapeutic activities;Functional mobility training;Stair training;Gait training;Therapeutic exercise;Balance training;Neuromuscular re-education;Patient/family education   PT Next Visit Plan Review corner balance exercises on compliant surface, low surface transfers focusing on forward lean   Consulted and Agree with Plan of Care Patient        Problem List Patient Active Problem List   Diagnosis Date Noted  . Fibromyalgia 01/16/2014  . Cholelithiasis 04/25/2013  . Paralysis agitans 01/15/2013  . Unspecified hereditary and idiopathic peripheral neuropathy 01/15/2013    Morgan [name] W. 02/12/2015, 9:16 PM  Frazier Butt., PT  [facility] [address] [place], [place], [address] Phone: [phone]   Fax:  [phone]

## 2015-02-17 ENCOUNTER — Ambulatory Visit: Payer: Medicare Other | Admitting: Physical Therapy

## 2015-02-20 ENCOUNTER — Ambulatory Visit: Payer: Medicare Other | Admitting: Physical Therapy

## 2015-02-20 VITALS — BP 126/83 | HR 112

## 2015-02-20 DIAGNOSIS — R269 Unspecified abnormalities of gait and mobility: Secondary | ICD-10-CM

## 2015-02-20 DIAGNOSIS — R279 Unspecified lack of coordination: Secondary | ICD-10-CM | POA: Diagnosis not present

## 2015-02-20 DIAGNOSIS — R258 Other abnormal involuntary movements: Secondary | ICD-10-CM

## 2015-02-20 NOTE — Therapy (Signed)
[facility] [address] [place], [place], [address] Phone: [phone]   Fax:  [phone]  Physical Therapy Treatment  Patient Details  Name: Morgan [name] MRN: [id number] Date of Birth: 1951-01-07 Referring Provider:  [name], MD  Encounter Date: 02/20/2015      PT End of Session - 02/20/15 1244    Visit Number 4   Number of Visits 9   Date for PT Re-Evaluation 03/15/15   Authorization Type Medicare-G-code required 10th visit   PT Start Time 0933   PT Stop Time 1007   PT Time Calculation (min) 34 min   Activity Tolerance Other (comment)  Pt feels weak from diarrhea earlier in the week; asks to end session early   Behavior During Therapy Flat affect      Past Medical History  Diagnosis Date  . Osteopenia   . Hypertension   . Parkinson disease   . Fibromyalgia   . Hypercholesteremia     controlled on medication  . Melanoma     Past Surgical History  Procedure Laterality Date  . Tonsillectomy    . Tubal ligation      Filed Vitals:   02/20/15 0940 02/20/15 0945  BP: 129/95 126/83  Pulse: 112     Visit Diagnosis:  Bradykinesia  Abnormality of gait      Subjective Assessment - 02/20/15 0936    Subjective Have had some diarrhea this week-no fever.  No changes in meds, feeling a bit better.   Currently in Pain? No/denies      Self Care: Pt feeling somewhat weak today from being sick earlier in the week.  She denies any fever associated with symptoms.  She has not seen physician-PT recommends if symptoms continue or worsen, to seek medical attention.  Pt brings in folder with PT (and PT and OT HEP from previous bouts of therapy) to ask how to prioritize exercises at home. Discussed/made sample chart of how to prioritize PWR! Moves and therapy exercises throughout the week.  Reviewed HEP given last visit-pt return demo understanding.  Pt performs exercises slowly, and reports not doing  them much this week due to not feeling well.  Otherwise, pt is independent with HEP.                             PT Long Term Goals - 02/10/15 1423    PT LONG TERM GOAL #1   Title Pt will be independent with HEP for improved transfers, balance, gait.  Target 02/13/15.  Updated target 03/15/15 for LTGs   Time 4   Period Weeks   Status On-going   PT LONG TERM GOAL #2   Title Pt will perform at least 8 of 10 reps of sit<>stand transfers from 18 inch surfaces or lower no UE support and no posterior lean for improved transfer efficiency.   Period Weeks   Status On-going   PT LONG TERM GOAL #3   Title Pt will improve Four-Square step test to less than or equal to 12 seconds with no loss of balance for improved balance/directional changes.   Time 4   Period Weeks   Status On-going   PT LONG TERM GOAL #4   Title Pt will improve Sensory Organization test composite score by 10% for improved overall balance.   Time 4   Period Weeks   Status On-going   PT LONG TERM GOAL #5   Title Pt will  imrpove Functional Gait Assessment score to at least 25/30 for decreased fall risk, improved dynamic gait.   Time 4   Period Weeks   Status On-going               Plan - 02/20/15 1245    Clinical Impression Statement Pt slowed with performance of HEP today due to not feeling well.  Pt will continue to benefit from further skilled PT to progress balance, gait and transfer training.   Pt will benefit from skilled therapeutic intervention in order to improve on the following deficits Abnormal gait;Decreased balance;Other (comment)  decreased timing and coordination with gait   Rehab Potential Good   PT Frequency 2x / week   PT Duration 4 weeks  wk 2 of 4   PT Treatment/Interventions ADLs/Self Care Home Management;Therapeutic activities;Functional mobility training;Stair training;Gait training;Therapeutic exercise;Balance training;Neuromuscular re-education;Patient/family  education   PT Next Visit Plan practice low surface transfers with increased forward lean, gait and balance activities on compliant surfaces        Problem List Patient Active Problem List   Diagnosis Date Noted  . Fibromyalgia 01/16/2014  . Cholelithiasis 04/25/2013  . Paralysis agitans 01/15/2013  . Unspecified hereditary and idiopathic peripheral neuropathy 01/15/2013    MARRIOTT,AMY W. 02/20/2015, 12:47 PM  Frazier Butt., PT  [facility] [address] [place], [place], [address] Phone: [phone]   Fax:  [phone]

## 2015-02-24 ENCOUNTER — Ambulatory Visit: Payer: Federal, State, Local not specified - PPO | Admitting: Physical Therapy

## 2015-02-25 ENCOUNTER — Ambulatory Visit: Payer: Medicare Other | Admitting: Physical Therapy

## 2015-02-25 VITALS — BP 146/98 | HR 101

## 2015-02-25 DIAGNOSIS — R269 Unspecified abnormalities of gait and mobility: Secondary | ICD-10-CM

## 2015-02-25 DIAGNOSIS — R279 Unspecified lack of coordination: Secondary | ICD-10-CM | POA: Diagnosis not present

## 2015-02-25 DIAGNOSIS — R258 Other abnormal involuntary movements: Secondary | ICD-10-CM | POA: Diagnosis not present

## 2015-02-25 NOTE — Therapy (Signed)
[facility] [address] [place], [place], [address] Phone: [phone]   Fax:  [phone]  Physical Therapy Treatment  Patient Details  Name: Morgan [name] MRN: [id number] Date of Birth: 1951/01/26 Referring Provider:  [name], MD  Encounter Date: 02/25/2015      PT End of Session - 02/25/15 0933    Visit Number 5   Number of Visits 9   Date for PT Re-Evaluation 03/15/15   Authorization Type Medicare-G-code required 10th visit   PT Start Time 0849   PT Stop Time 0924   PT Time Calculation (min) 35 min   Activity Tolerance Treatment limited secondary to medical complications (Comment)  feels weak today and limited by increased BP   Behavior During Therapy Flat affect      Past Medical History  Diagnosis Date  . Osteopenia   . Hypertension   . Parkinson disease   . Fibromyalgia   . Hypercholesteremia     controlled on medication  . Melanoma     Past Surgical History  Procedure Laterality Date  . Tonsillectomy    . Tubal ligation      Filed Vitals:   02/25/15 0900 02/25/15 0917 02/25/15 0924  BP: 144/91 148/100 146/98  Pulse: 94 108 101    Visit Diagnosis:  Abnormality of gait  Bradykinesia      Subjective Assessment - 02/25/15 0854    Subjective Pt continues to be weak from previous diarrhea and has not been able to do exercises.   Pertinent History Fibromyalgia, neuropathy   Patient Stated Goals Pt's goal for therapy is to walk normal without leg pain.   Currently in Pain? Yes   Pain Score --  aching   Pain Location Leg   Pain Orientation Left;Right   Pain Descriptors / Indicators Aching   Pain Type Chronic pain   Pain Relieving Factors biofreeze           [facility] Adult PT Treatment/Exercise - 02/25/15 0001    Knee/Hip Exercises: Aerobic   Stationary Bike Scifit level 2.0 all 4 extremities x 8 minutes with trying to keep rpm>40.     Performed forward step and hold,  backward step and hold-in parallel bars with intermittent UE support. Forward and backward marching in parallel bars 8"x6 reps Steps onto/off 6" step with intermittent UE support  Bil heel cord stretch off edge of step x 30 seconds x 3, runners stretch at wall x 30 seconds, hamstring stretch bil seated x 30 seconds x 2  See BP's above taking during session.         PT Education - 02/25/15 [phone]    Education provided Yes   Education Details Check BP at home and notify MD if continues to be greater than 027 diastolic;cancelling appt on Friday if BP still high or not feeling up to therapy   Person(s) Educated Patient   Methods Explanation   Comprehension Verbalized understanding             PT Long Term Goals - 02/10/15 1423    PT LONG TERM GOAL #1   Title Pt will be independent with HEP for improved transfers, balance, gait.  Target 02/13/15.  Updated target 03/15/15 for LTGs   Time 4   Period Weeks   Status On-going   PT LONG TERM GOAL #2   Title Pt will perform at least 8 of 10 reps of sit<>stand transfers from 18 inch surfaces or lower no UE support and no  posterior lean for improved transfer efficiency.   Period Weeks   Status On-going   PT LONG TERM GOAL #3   Title Pt will improve Four-Square step test to less than or equal to 12 seconds with no loss of balance for improved balance/directional changes.   Time 4   Period Weeks   Status On-going   PT LONG TERM GOAL #4   Title Pt will improve Sensory Organization test composite score by 10% for improved overall balance.   Time 4   Period Weeks   Status On-going   PT LONG TERM GOAL #5   Title Pt will imrpove Functional Gait Assessment score to at least 25/30 for decreased fall risk, improved dynamic gait.   Time 4   Period Weeks   Status On-going               Plan - 02/25/15 1115    Clinical Impression Statement Pt with increased BP during session today and continues to feel weak.  Has not been doing HEP  for past several weeks due to illness.  Continue PT per POC.   Pt will benefit from skilled therapeutic intervention in order to improve on the following deficits Abnormal gait;Decreased balance;Other (comment)  decreased timing and coordination with gait   Rehab Potential Good   PT Frequency 2x / week   PT Duration 4 weeks  wk 2 of 4   PT Treatment/Interventions ADLs/Self Care Home Management;Therapeutic activities;Functional mobility training;Stair training;Gait training;Therapeutic exercise;Balance training;Neuromuscular re-education;Patient/family education   PT Next Visit Plan practice low surface transfers with increased forward lean, gait and balance activities on compliant surfaces   Consulted and Agree with Plan of Care Patient        Problem List Patient Active Problem List   Diagnosis Date Noted  . Fibromyalgia 01/16/2014  . Cholelithiasis 04/25/2013  . Paralysis agitans 01/15/2013  . Unspecified hereditary and idiopathic peripheral neuropathy 01/15/2013    [name] 02/25/2015, 11:17 AM  [facility] [address] [place], [place], [address] Phone: [phone]   Fax:  [place], [place] 02/25/2015 11:17 AM Phone: [phone] Fax: [phone]

## 2015-02-27 ENCOUNTER — Ambulatory Visit: Payer: Medicare Other | Admitting: Physical Therapy

## 2015-03-03 ENCOUNTER — Ambulatory Visit: Payer: Medicare Other | Admitting: Physical Therapy

## 2015-03-04 ENCOUNTER — Ambulatory Visit: Payer: Medicare Other | Attending: Neurology | Admitting: Physical Therapy

## 2015-03-04 VITALS — BP 157/88 | HR 93

## 2015-03-04 DIAGNOSIS — R258 Other abnormal involuntary movements: Secondary | ICD-10-CM | POA: Diagnosis not present

## 2015-03-04 DIAGNOSIS — R269 Unspecified abnormalities of gait and mobility: Secondary | ICD-10-CM | POA: Insufficient documentation

## 2015-03-04 NOTE — Therapy (Signed)
[facility] [address] [place], [place], [address] Phone: [phone]   Fax:  [phone]  Physical Therapy Treatment  Patient Details  Name: Morgan [name] MRN: [id number] Date of Birth: Jun 22, 1951 Referring Provider:  [name], MD  Encounter Date: 03/04/2015      PT End of Session - 03/04/15 1251    Visit Number 6   Number of Visits 9   Date for PT Re-Evaluation 03/15/15   Authorization Type Medicare-G-code required 10th visit   PT Start Time 1107   PT Stop Time 1147   PT Time Calculation (min) 40 min   Activity Tolerance Patient tolerated treatment well   Behavior During Therapy Flat affect      Past Medical History  Diagnosis Date  . Osteopenia   . Hypertension   . Parkinson disease   . Fibromyalgia   . Hypercholesteremia     controlled on medication  . Melanoma     Past Surgical History  Procedure Laterality Date  . Tonsillectomy    . Tubal ligation      Filed Vitals:   03/04/15 1100 03/04/15 1115  BP: 155/105 157/88  Pulse: 93     Visit Diagnosis:  Abnormality of gait  Bradykinesia      Subjective Assessment - 03/04/15 1241    Subjective Pt reports feeling better but not back to normal.  Denies falls.   Pertinent History Fibromyalgia, neuropathy   Patient Stated Goals Pt's goal for therapy is to walk normal without leg pain.   Currently in Pain? No/denies            [facility] PT Assessment - 03/04/15 1245    Standardized Balance Assessment   Four Square Step Test Trial One  8.4 seconds   Four Square Step Test    Trial One  8.4 seconds           [facility] Adult PT Treatment/Exercise - 03/04/15 1245    Transfers   Transfers Sit to Stand;Stand to Sit   Sit to Stand 7: Independent;Without upper extremity assist;From chair/3-in-1   Stand to Sit 7: Independent;Without upper extremity assist;To chair/3-in-1   High Level Balance   High Level Balance Activities Other  (comment)   High Level Balance Comments Cone taps, double taps, tipping/uprighting on noncompliant and compliant surface with supervision and able to recover LOB     Sit<>stand from simulated couch <17" x 10 with supervision and no posterior lean without UE assist.  Step ups onto/off 4" aerobic step then onto aerobic step with 1 LE and tap to chair infront with other LE then back to floor and backward step weight shift.  Performed these on noncompliant and compliant surfaces.  Pt does need rest break during session.  Discussed increased BP at start of session.  Pt thinks this is related to anxiety.  Discussed having pt check BP at various times at home and keep log of BP's.  Encouraged to call MD if she feels like BP is consistently high based on her measurements at home.           PT Education - 03/04/15 1249    Education provided Yes   Education Details Progress toward goals, keeping track of BP at home   Person(s) Educated Patient   Methods Explanation   Comprehension Verbalized understanding             PT Long Term Goals - 03/04/15 1250    PT LONG TERM GOAL #1  Title Pt will be independent with HEP for improved transfers, balance, gait.  Target 02/13/15.  Updated target 03/15/15 for LTGs   Time 4   Period Weeks   Status On-going   PT LONG TERM GOAL #2   Title Pt will perform at least 8 of 10 reps of sit<>stand transfers from 18 inch surfaces or lower no UE support and no posterior lean for improved transfer efficiency.   Period Weeks   Status Achieved   PT LONG TERM GOAL #3   Title Pt will improve Four-Square step test to less than or equal to 12 seconds with no loss of balance for improved balance/directional changes.   Time 4   Period Weeks   Status Achieved   PT LONG TERM GOAL #4   Title Pt will improve Sensory Organization test composite score by 10% for improved overall balance.   Time 4   Period Weeks   Status On-going   PT LONG TERM GOAL #5   Title Pt  will imrpove Functional Gait Assessment score to at least 25/30 for decreased fall risk, improved dynamic gait.   Time 4   Period Weeks   Status On-going               Plan - 03/04/15 1251    Clinical Impression Statement Pt met LTG's 2 and 3.  Discussed PT POC with [name], PT, who will finish checking goals next session and likely d/c from PT.   Pt will benefit from skilled therapeutic intervention in order to improve on the following deficits Abnormal gait;Decreased balance;Other (comment)   Rehab Potential Good   PT Frequency 2x / week   PT Duration 4 weeks   PT Treatment/Interventions ADLs/Self Care Home Management;Therapeutic activities;Functional mobility training;Stair training;Gait training;Therapeutic exercise;Balance training;Neuromuscular re-education;Patient/family education   PT Next Visit Plan Finish checking LTG's and ?d/c.   Consulted and Agree with Plan of Care Patient        Problem List Patient Active Problem List   Diagnosis Date Noted  . Fibromyalgia 01/16/2014  . Cholelithiasis 04/25/2013  . Paralysis agitans 01/15/2013  . Unspecified hereditary and idiopathic peripheral neuropathy 01/15/2013    [name] 03/04/2015, 12:54 PM  [facility] [address] [place], [place], [address] Phone: [phone]   Fax:  [name], [name] 03/04/2015 12:54 PM Phone: [phone] Fax: [phone]

## 2015-03-05 ENCOUNTER — Other Ambulatory Visit: Payer: Self-pay | Admitting: *Deleted

## 2015-03-05 ENCOUNTER — Ambulatory Visit: Payer: Medicare Other | Admitting: Physical Therapy

## 2015-03-05 VITALS — BP 132/90

## 2015-03-05 DIAGNOSIS — R269 Unspecified abnormalities of gait and mobility: Secondary | ICD-10-CM | POA: Diagnosis not present

## 2015-03-05 DIAGNOSIS — G20 Parkinson's disease: Secondary | ICD-10-CM

## 2015-03-05 DIAGNOSIS — R258 Other abnormal involuntary movements: Secondary | ICD-10-CM | POA: Diagnosis not present

## 2015-03-05 NOTE — Therapy (Signed)
[facility] [address] [place], [place], [address] Phone: [phone]   Fax:  [phone]  Physical Therapy Treatment  Patient Details  Name: Morgan [name] MRN: [id number] Date of Birth: 1951/04/06 Referring Provider:  [name], MD  Encounter Date: 03/05/2015      PT End of Session - 03/05/15 1039    Visit Number 7   Number of Visits 9   Date for PT Re-Evaluation 03/15/15   Authorization Type Medicare-G-code required 10th visit   PT Start Time 0849   PT Stop Time 0932   PT Time Calculation (min) 43 min   Activity Tolerance Patient tolerated treatment well   Behavior During Therapy Flat affect      Past Medical History  Diagnosis Date  . Osteopenia   . Hypertension   . Parkinson disease   . Fibromyalgia   . Hypercholesteremia     controlled on medication  . Melanoma     Past Surgical History  Procedure Laterality Date  . Tonsillectomy    . Tubal ligation      Filed Vitals:   03/05/15 0854  BP: 132/90    Visit Diagnosis:  Abnormality of gait      Subjective Assessment - 03/05/15 0854    Subjective Reports feeling better.   Currently in Pain? Yes   Pain Score 3    Pain Location Foot   Pain Orientation Right;Left   Pain Descriptors / Indicators Aching   Pain Type Chronic pain   Aggravating Factors  walking throughout the day   Pain Relieving Factors unsure            [facility] PT Assessment - 03/05/15 0858    Standardized Balance Assessment   Standardized Balance Assessment Balance Master Testing   Balance Master Testing Sensory Organization Test   Functional Gait  Assessment   Gait Level Surface Walks 20 ft in less than 7 sec but greater than 5.5 sec, uses assistive device, slower speed, mild gait deviations, or deviates 6-10 in outside of the 12 in walkway width.  5.91 sec   Change in Gait Speed Able to smoothly change walking speed without loss of balance or gait deviation.  Deviate no more than 6 in outside of the 12 in walkway width.   Gait with Horizontal Head Turns Performs head turns smoothly with no change in gait. Deviates no more than 6 in outside 12 in walkway width   Gait with Vertical Head Turns Performs head turns with no change in gait. Deviates no more than 6 in outside 12 in walkway width.   Gait and Pivot Turn Pivot turns safely within 3 sec and stops quickly with no loss of balance.   Step Over Obstacle Is able to step over one shoe box (4.5 in total height) without changing gait speed. No evidence of imbalance.   Gait with Narrow Base of Support Is able to ambulate for 10 steps heel to toe with no staggering.   Gait with Eyes Closed Cannot walk 20 ft without assistance, severe gait deviations or imbalance, deviates greater than 15 in outside 12 in walkway width or will not attempt task.   Ambulating Backwards Walks 20 ft, no assistive devices, good speed, no evidence for imbalance, normal gait   Steps Alternating feet, must use rail.   Total Score 24                Neuro Re-education:      [facility] Adult PT Treatment/Exercise -  03/05/15 0857    Standardized Balance Assessment   Standardized Balance Assessment Balance Master Testing       Sensory Organization Test:  Composite score 60, improved from 56.  Visual system use improved to WNL.  Vestibular system use approximately 30%.  Pt has one fall on Condition 5 and 2 falls on condition 6-all in posterior direction (therapist assists with regaining balance and test briefly stopped).    Self Care: Discussed results of SOT, including decreased vestibular system use and continued difficulty on conditions 5 and 6.  Discussed role that fatigue plays for patient, and need to take frequent rest breaks.  Discussed lowly lit/busy environments with unlevel surfaces, and needing to have increased awareness of balance difficulty in those situations.  Discussed progress towards goals, potential for  return therapy screen.  Also discussed pt's mention of slowed ADLs and pt agrees that she would like to pursue occupational therapy-PT to request order from Dr. Carles Collet.       PT Education - 03/05/15 1038    Education provided Yes   Education Details progress towards goals; results of SOT, and plans for D/C; pt prefers to wait for PT screen until OT order obtained and finishing OT in the future   Person(s) Educated Patient   Methods Explanation   Comprehension Verbalized understanding             PT Long Term Goals - 03/05/15 1042    PT LONG TERM GOAL #1   Title Pt will be independent with HEP for improved transfers, balance, gait.  Target 02/13/15.  Updated target 03/15/15 for LTGs   Time 4   Period Weeks   Status Achieved   PT LONG TERM GOAL #2   Title Pt will perform at least 8 of 10 reps of sit<>stand transfers from 18 inch surfaces or lower no UE support and no posterior lean for improved transfer efficiency.   Period Weeks   Status Achieved   PT LONG TERM GOAL #3   Title Pt will improve Four-Square step test to less than or equal to 12 seconds with no loss of balance for improved balance/directional changes.   Time 4   Period Weeks   Status Achieved   PT LONG TERM GOAL #4   Title Pt will improve Sensory Organization test composite score by 10% for improved overall balance.   Baseline SOT improved 56>60 for composite score   Time 4   Period Weeks   Status Not Met   PT LONG TERM GOAL #5   Title Pt will imrpove Functional Gait Assessment score to at least 25/30 for decreased fall risk, improved dynamic gait.   Baseline FGA score 24/30, improved from 22/30   Time 4   Period Weeks   Status Not Met               Plan - 03/05/15 1040    Clinical Impression Statement Pt has met LTG #1.  LTG #4 and 5 not met, though pt made some progress on Sensory Organization test and on Functional Gait Assessment.  Pt's fatigue tends to play a role in her funcitonal mobility.  Pt is  appropriate fro discharge from PT at this time, and she continues to plan to participate in community [facility] exercise class.   Pt will benefit from skilled therapeutic intervention in order to improve on the following deficits Abnormal gait;Decreased balance;Other (comment)   Rehab Potential Good   PT Frequency 2x / week   PT Duration 4  weeks   PT Treatment/Interventions ADLs/Self Care Home Management;Therapeutic activities;Functional mobility training;Stair training;Gait training;Therapeutic exercise;Balance training;Neuromuscular re-education;Patient/family education   PT Next Visit Plan Discharge this visit   Recommended Other Services Pt wants PT to request OT order due to slowed ADL performance   Consulted and Agree with Plan of Care Patient          G-Codes - March 16, 2015 1044    Functional Assessment Tool Used 4-square step tes 8.4 seconds, Functional Gait Assessment 24/30, improved from 22/30   Functional Limitation Mobility: Walking and moving around   Mobility: Walking and Moving Around Goal Status [phone]) At least 1 percent but less than 20 percent impaired, limited or restricted   Mobility: Walking and Moving Around Discharge Status [phone]) At least 1 percent but less than 20 percent impaired, limited or restricted      Problem List Patient Active Problem List   Diagnosis Date Noted  . Fibromyalgia 01/16/2014  . Cholelithiasis 04/25/2013  . Paralysis agitans 01/15/2013  . Unspecified hereditary and idiopathic peripheral neuropathy 01/15/2013   PHYSICAL THERAPY DISCHARGE SUMMARY  Visits from Start of Care: 7  Current functional level related to goals / functional outcomes: See goals assessment above   Remaining deficits: Balance, gait, fatigue, pain     Education / Equipment: Pt educated in HEP, fall prevention based on SOT scores.  Plan: Patient agrees to discharge.  Patient goals were partially met. Patient is being discharged due to                                                      ?????being independent with exercises and participating in community fitness.     MARRIOTT,AMY W. 03-16-2015, 10:46 AM  [facility] Butt., PT [place] [address] [place], [place], [address] Phone: [phone]   Fax:  [phone]

## 2015-03-27 ENCOUNTER — Telehealth: Payer: Self-pay | Admitting: *Deleted

## 2015-03-27 NOTE — Telephone Encounter (Signed)
Patient would like a Rx for a scooter for her vacation  Call back number [phone]

## 2015-03-27 NOTE — Telephone Encounter (Signed)
Patient made aware that I am unaware of any place that will give her a scooter just for vacation. I offered to send her for a wheel chair eval at neuro rehab - but not sure if this is the route to go either. She wants to know if there is anyway to get reimbursed for a rental for vacation or to not pay the tax on it. Dr [name] please advise.

## 2015-03-31 NOTE — Telephone Encounter (Signed)
I don't know how you would get reimbursed for one (she would have to call insurance company and ask about that) but I have had several patients rent them for vacations (but assume that they paid out of pocket)

## 2015-04-01 NOTE — Telephone Encounter (Signed)
Patient made aware.

## 2015-04-16 DIAGNOSIS — D225 Melanocytic nevi of trunk: Secondary | ICD-10-CM | POA: Diagnosis not present

## 2015-04-16 DIAGNOSIS — L219 Seborrheic dermatitis, unspecified: Secondary | ICD-10-CM | POA: Diagnosis not present

## 2015-04-16 DIAGNOSIS — L821 Other seborrheic keratosis: Secondary | ICD-10-CM | POA: Diagnosis not present

## 2015-04-16 DIAGNOSIS — Z85820 Personal history of malignant melanoma of skin: Secondary | ICD-10-CM | POA: Diagnosis not present

## 2015-05-08 ENCOUNTER — Telehealth: Payer: Self-pay | Admitting: Neurology

## 2015-05-08 MED ORDER — AMBULATORY NON FORMULARY MEDICATION: 1.0000 | Freq: Every day | Status: DC

## 2015-05-08 NOTE — Telephone Encounter (Signed)
Pt needs to talk to someone about the sales tax on the scooter please call [phone]

## 2015-05-08 NOTE — Telephone Encounter (Signed)
Spoke with patient and she states if we fax a prescription for a scooter to [facility] they will refund her the sales tax she paid. RX written and faxed to [phone] with confirmation received.

## 2015-05-19 ENCOUNTER — Ambulatory Visit: Payer: Medicare Other | Attending: Neurology | Admitting: Occupational Therapy

## 2015-05-19 DIAGNOSIS — R293 Abnormal posture: Secondary | ICD-10-CM

## 2015-05-19 DIAGNOSIS — R258 Other abnormal involuntary movements: Secondary | ICD-10-CM | POA: Diagnosis not present

## 2015-05-19 DIAGNOSIS — R2681 Unsteadiness on feet: Secondary | ICD-10-CM

## 2015-05-19 DIAGNOSIS — R278 Other lack of coordination: Secondary | ICD-10-CM

## 2015-05-19 DIAGNOSIS — R29898 Other symptoms and signs involving the musculoskeletal system: Secondary | ICD-10-CM

## 2015-05-19 DIAGNOSIS — R279 Unspecified lack of coordination: Secondary | ICD-10-CM | POA: Diagnosis not present

## 2015-05-19 NOTE — Therapy (Signed)
[facility] [address] [place], [place], [address] Phone: [phone]   Fax:  [phone]  Occupational Therapy Evaluation  Patient Details  Name: Morgan [name] MRN: [id number] Date of Birth: 16-Aug-1951 Referring Provider:  Ludwig Clarks, DO  Encounter Date: 05/19/2015      OT End of Session - 05/19/15 1436    Visit Number 1   Number of Visits 13   Date for OT Re-Evaluation 07/18/15   Authorization Type Medicare primary, BCBS secondary   Authorization Time Period cert. period 05/19/15-07/18/15   Authorization - Visit Number 1   Authorization - Number of Visits 10  G   OT Start Time 1150   OT Stop Time 3903   OT Time Calculation (min) 45 min   Activity Tolerance Patient tolerated treatment well   Behavior During Therapy WFL for tasks assessed/performed      Past Medical History  Diagnosis Date  . Osteopenia   . Hypertension   . Parkinson disease   . Fibromyalgia   . Hypercholesteremia     controlled on medication  . Melanoma     Past Surgical History  Procedure Laterality Date  . Tonsillectomy    . Tubal ligation      There were no vitals filed for this visit.  Visit Diagnosis:  Bradykinesia  Rigidity  Decreased coordination  Unsteadiness  Abnormal posture      Subjective Assessment - 05/19/15 1200    Subjective  Pt reports difficulty with donning socks   Pertinent History fibromyalgia, PD diagnosis approx 6 years ago   Patient Stated Goals improve ability to don socks   Currently in Pain? No/denies           [facility] OT Assessment - 05/19/15 0001    Assessment   Diagnosis PD   Onset Date --  approx 6 years ago   Prior Therapy last was PT ending in 02/2015   Precautions   Precautions Fall   Balance Screen   Has the patient fallen in the past 6 months No   Home  Environment   Family/patient expects to be discharged to: Private residence   Lives With Spouse   Prior Function    Level of Independence Independent with basic ADLs;Independent with homemaking with ambulation   Leisure enjoys reading, performing HEPs from PT (including walking, bike)   ADL   Eating/Feeding --  min difficulty cutting food, min difficulty opening packages   Grooming --  fatigue with grooming task, may need breaks   Upper Body Bathing --  incr time   Lower Body Bathing --  incr time   Upper Body Dressing --  donns bra by fastening and pulling overhead   Lower Body Dressing --  mod difficulty putting on socks   Toilet Tranfer Modified independent   Toileting - Clothing Manipulation Modified independent   Toileting -  Hygiene --  occasional min difficulty reaching back   Tub/Shower Transfer Modified independent   Data processing manager tub bench;Grab bars;Walk in shower   ADL comments min difficulty putting in earrings, requires incr time for self care now   IADL   Prior Level of Function Light Housekeeping --  husband performs, did not perform much cleaning previously   Light Housekeeping --  performs light tasks (loading dishwasher, laundry)   Prior Level of Function [employer] husband cooks    Prior Level of Function Community Mobility pt reports that she has had times where she has not felt  comfortable and husband has driven her (rarely)   Programmer, applications own vehicle  see above   Medication Management Is responsible for taking medication in correct dosages at correct time   Physiological scientist financial matters independently (budgets, writes checks, pays rent, bills goes to [employer]), collects and keeps track of income   Mobility   Mobility Status Comments fall risk, pt ambulates without device in the home and uses walking stick in the community, pt reports that she will be getting a scooter for when she travels.  Noted unstediness/LOB (but able to recover unassisted) when ambulating from lobby to gym.  Pt with min forward lean, head forward,  rounded shoulders.   Written Expression   Dominant Hand Right   Handwriting 100% legible  no decrease in size   Vision - History   Baseline Vision Wears glasses all the time   Additional Comments has floaters not affecting function   Activity Tolerance   Activity Tolerance Comments reports fatigue and needs breaks during grooming/self care tasks at times   Cognition   Overall Cognitive Status Within Functional Limits for tasks assessed  pt reports only occasional word finding difficulty   Memory --  reports difficulty remembering meds when traveling   Memory Impairment --  denies change   Observation/Other Assessments   Other Surveys  Select   Physical Performance Test   Yes   Simulated Eating Time (seconds) 11.50   Donning Doffing Jacket Time (seconds) 17.81   Coordination   9 Hole Peg Test Right;Left   Right 9 Hole Peg Test 22.06   Left 9 Hole Peg Test 31.38   Box and Blocks R-46blocks, L-39blocks   Tremors Pt report mild tremors in BUEs/BLEs when she "overdoes it" only   Tone   Assessment Location Right Upper Extremity;Left Upper Extremity   ROM / Strength   AROM / PROM / Strength AROM   AROM   Overall AROM  Within functional limits for tasks performed  grossly   RUE Tone   RUE Tone Mild  noted with movement, but not formally assessed   LUE Tone   LUE Tone Mild  noted with movement, but not formally assessed                         OT Education - 05/19/15 1645    Education provided Yes   Education Details Recommended use of timer/alarm for remembering to take meds when traveling; OT POC/changes from last OT screen; recommended pt bringing in previous OT/PT for updates and importance on regularly performing coordination HEP, recommendation re:  PD-specific community exercises classes   Person(s) Educated Patient   Methods Explanation   Comprehension Verbalized understanding          OT Short Term Goals - 05/19/15 1628    OT SHORT TERM GOAL #1    Title Pt will be independent with updated PD-specific HEP.--check STGs 07/03/15   Time 3   Period Weeks   Status New   OT SHORT TERM GOAL #2   Title Pt will decr bradykinesia and incr activity tolerance in order to complete grooming tasks without rest break.   Time 3   Period Weeks   Status New           OT Long Term Goals - 05/19/15 1629    OT LONG TERM GOAL #1   Title Pt will verbalize understanding of adaptive strategies/AE to incr ease, safety with ADLs/IADLs prn.--check LTGs 07/18/15  Time 6   Period Weeks   Status New   OT LONG TERM GOAL #2   Title Pt will report incr ease with donning socks.   Time 6   Period Weeks   Status New   OT LONG TERM GOAL #3   Title Pt will improve coordination for ADLs as shown by improving time on 9-hole peg test by at least 5sec with LUE.   Baseline 31.38sec   Time 6   Period Weeks   Status New   OT LONG TERM GOAL #4   Title Pt will improve dressing ease with decr time as shown by completing PPT#4 in 15sec or less.   Baseline 17.81sec   Time 6   Period Weeks   Status New   OT LONG TERM GOAL #5   Title Pt will improve LUE coordination/functional reaching as shown by improving score on box and blocks test by at least 5.   Baseline 39 blocks   Time 6   Period Weeks   Status New               Plan - 05/22/15 1437    Clinical Impression Statement Pt is a 64 y.o. female who is familiar to this therapist.  Pt was diagnosed with PD approx 6 years ago and has participated in occupational therapy in the past, but has not had OT in over a year.  Pt also has hx of fibromyalgia.  Pt was discharged from PT in 02/2015.   Pt presents today with bradykinesia, rigidity, decreased coordination, abnormal posture, and unsteadiness.  Pt reports incr difficulty/time needed for ADLs and particularly donning socks.  Pt would benefit from OT to address these deficits to improve UE functional use, incr ease with ADL performance, and improve quality of  life.   Pt will benefit from skilled therapeutic intervention in order to improve on the following deficits (Retired) Decreased coordination;Decreased endurance;Impaired tone;Impaired UE functional use;Decreased mobility;Decreased balance;Decreased knowledge of use of DME;Decreased activity tolerance  bradykinesia, abnormal posture   Rehab Potential Good   OT Frequency 2x / week   OT Duration 6 weeks  +eval, pt desires to wait to start OT treatment until week of 06/15/15 (after upcoming cruise)   OT Treatment/Interventions Self-care/ADL training;Therapeutic exercise;Neuromuscular education;Functional Mobility Training;Patient/family education;Manual Therapy;Ultrasound;Energy conservation;Therapeutic exercises;Therapeutic activities;DME and/or AE instruction;Passive range of motion;Moist Heat;Cryotherapy  [facility] ([facility]  treatment techniques   Plan begin updating HEP (pt to bring in previous handouts from PT/OT and exercise flowsheet/schedule)   Consulted and Agree with Plan of Care Patient          G-Codes - 2015/05/22 1637    Functional Assessment Tool Used L 9-hole peg test:  21.28sec, L box and blocks test- 39 blocks, difficutly donning socks, fatigue/needs rest break for grooming tasks, PPT#4 17.81sec   Functional Limitation Self care   Self Care Current Status (C1660) At least 20 percent but less than 40 percent impaired, limited or restricted   Self Care Goal Status (Y3016) At least 1 percent but less than 20 percent impaired, limited or restricted      Problem List Patient Active Problem List   Diagnosis Date Noted  . Fibromyalgia 01/16/2014  . Cholelithiasis 04/25/2013  . Paralysis agitans 01/15/2013  . Unspecified hereditary and idiopathic peripheral neuropathy 01/15/2013    [facility] 05-22-2015, 4:49 PM  [place] [address] [place], [place], [address] Phone: [phone]   Fax:   [phone]    Levada Dy  Domingo Cocking, OTR/L 05/19/2015 4:49 PM

## 2015-05-26 ENCOUNTER — Ambulatory Visit: Payer: Medicare Other | Admitting: Neurology

## 2015-05-26 ENCOUNTER — Encounter: Payer: Self-pay | Admitting: Neurology

## 2015-05-26 ENCOUNTER — Ambulatory Visit (INDEPENDENT_AMBULATORY_CARE_PROVIDER_SITE_OTHER): Payer: Medicare Other | Admitting: Neurology

## 2015-05-26 VITALS — BP 120/62 | HR 107 | Ht 65.0 in | Wt 182.0 lb

## 2015-05-26 DIAGNOSIS — G4752 REM sleep behavior disorder: Secondary | ICD-10-CM

## 2015-05-26 DIAGNOSIS — G20 Parkinson's disease: Secondary | ICD-10-CM | POA: Diagnosis not present

## 2015-05-26 DIAGNOSIS — M797 Fibromyalgia: Secondary | ICD-10-CM

## 2015-05-26 DIAGNOSIS — G4700 Insomnia, unspecified: Secondary | ICD-10-CM | POA: Diagnosis not present

## 2015-05-26 DIAGNOSIS — G609 Hereditary and idiopathic neuropathy, unspecified: Secondary | ICD-10-CM | POA: Diagnosis not present

## 2015-05-26 MED ORDER — SINEMET 25-100 MG PO TABS: 1.0000 | ORAL_TABLET | Freq: Three times a day (TID) | ORAL | Status: DC

## 2015-05-26 MED ORDER — MIRAPEX ER 3 MG PO TB24: 3.0000 mg | ORAL_TABLET | Freq: Every day | ORAL | Status: DC

## 2015-05-26 NOTE — Progress Notes (Signed)
Morgan [name] was seen today in the movement disorders clinic for neurologic consultation at the request of Dr. [name] at [facility] internal medicine.  She is accompanied by her husband who helps to supplement the history.  She has previously seen neurology, both Dr. Krista Blue and Dr. Doy Mince.  I reviewed some of these notes.  The consultation is for the evaluation of Parkinson's disease.  The patient is a 64 y.o. right handed female reports that her first sx was "active dreaming."  However, the symptom that got her to the dr was L greater than R hand tremor, only when moving hands/drying hair.  This was in 2010.  She was started on carbidopa/levodopa just as a "trial" for 2 weeks and then it was stopped because it worked.  She was told that it was diagnostic of PD.  She was then started on the Mirapex ER 1.5 mg in 07/2009. and it was worked up to 3.0 mg.  She reports compulsive shopping (she overspends her "allowance).  She mostly internets shops.  She has always shopped but she has previously not gone over the "allowance."  She plays the lottery and plays more than previous.  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  She tried to get off the medication but it was unsuccessful because of worsening of PD.  She was placed on Azilect.  She has been on it for about 2 years.  She has not noted any benefits.    It was her impression that it made the mirapex last longer.  She feels strongly that the computer work and shopping became an issue because of the azilect.   04/19/13 update:  The patient presents today with her husband who supplements the history.  Once she discontinued the Azilect, she felt that her compulsive shopping got better.  Her husband feels that she is not exercising enough, but the patient estimates that she perhaps exercises 4 days per week, but not to the intensity that her husband would like.  She had one near fall since our last visit, but she was on a trail in [place] with 2 walking sticks and one  of them something to the ground and her husband caught her.  She is still taking clonazepam at night.  Her husband thinks that she takes it too early and falls asleep on the couch, but she denies this.  There have not been hallucinations.  There has not been nausea or vomiting.  She has been slower.   08/13/13 update:  The pt is f/u for PD.  She is accompanied by her husband who supplements the hx.  She is on mirapex 3 mg daily and carbidopa/levodopa 25/100 was added last visit.  She notices that she has more energy and can do more things, although her husband states that she does not necessarily do those things (including exercise). She noted that the feet "wobble" since being on the medication.  She also notices that her shopping "addiction" has returned.  She has had no falls.  No hallucinations.  No near syncope.  09/24/13 update:  The pt is f/u regarding PD.  She is accompanied by her husband who helps to supplement the history.  Last visit, I decreased her pramipexole to 2.25 mg daily secondary to compulsive shopping.  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  They both  agree that this could be pathologic.  She is now on carbidopa/levodopa 25/100, half a tablet 4 times per day.  Dyskinesia in the foot is better.  She is just getting over the flu.  No hallucinations.  She has not started exercising faithfully, primarily because she has not been feeling well.  No falls.    12/24/13 update:  The patient is following up in regards to her Parkinson's disease.  She is accompanied by her husband who helps to supplement the history.  Her Mirapex has been reduced to 1.5 mg daily secondary to compulsive behaviors.  She did call and ask if she could increase it, but I did not want to do that.  She is still having compulsive behaviors (shopping) and her husband  would like her to discontinue the medication altogether.  She is currently on carbidopa/levodopa 25/100, half a tablet 4 times per day.  She takes at approximately 8 AM, noon, 4 PM and 9 PM.  Unfortunately, the patient had dyskinesia at higher dosages.  Nonetheless, she states that she would rather have the dyskinesia than feel like she currently does.  She feels slow and achy and just does not feel like doing anything.  She was not having any hallucinations.  She does state that she signed up for a 5K in January and would like to start training for that.  She is not having any hallucinations.  No falls.  She is still on Cymbalta, 60 mg daily as well as clonazepam for insomnia.  01/16/14 update:  The patient is accompanied by her husband who helps to supplement the history.  Last visit, we started the patient on Rytary, and she is currently on 145 mg 3 times a day.  Unfortunately, the patient reports that she had insomnia on the medication and felt "woozy" and really overall felt better on the carbidopa/levodopa IR and would like to go back to that medication, even though she dyskinesia on low dose.  She asks me if she can wean the Mirapex and restart the levodopa.  [facility].  She is planning on working up to running a 5K at [employer] in January, 2016.  She remains on the Cymbalta for her fibromyalgia.  03/20/14 update:  The patient returns today as a work in, accompanied by her husband who supplements the history.  Much has happened since our last visit.  Last visit, I tried to decrease the patient's Mirapex ER to 0.75 mg daily, primarily because of compulsive behaviors.  I started her on levodopa.  She called me back and wanted to go up on the Mirapex because of cramping in the thigh, but instead I increased her levodopa to 4 times a day.  The cramping did get better, but she called later with dyskinesia.  At that time my partner was on  call and she reluctantly increased the patient's Mirapex back to 1.5 mg daily and decreased patient's levodopa to twice a day dosing.  The patient later called me with continued dyskinesias and wanted to go further up on the Mirapex, but I have been resistant because of her compulsive spending in the past.  Instead, on 03/07/2014 I asked her to try amantadine and called in a prescription for this.  Unfortunately, she stated that she read the side effects and decided that she was not going to pick this up and take it.  She is now only on Mirapex 1.5 mg  daily and levodopa 25/100, one tablet in the morning and half tablet at night.  She would like to go back up on the Mirapex and her husband states that if he has to disable the computer to deal with her compulsive shopping then he will do that.  04/21/14 update:  Pt returns today for follow up, accompanied by her husband who supplements the history.  She is back on mirapex, 1.5 mg daily.  She did not do well on the requip and was pretty insistent on going back on the mirapex despite previous convulsive behaviors.  She wants to go off levodopa 25/100, and she is on it 1/2 bid but often forgets the second dose.  She is able to drive again.   She states that she thinks that she needs to go back up to the 3.0 of the mirapex.  Her husband is not now completely convinced that the mirapex caused compulsive shopping but thinks that boredom did that.  They are getting ready to go on a 14 day [employer] cruise at the end of October.   07/29/14 update:  Pt returns for f/u, accompanied by her husband who supplements the history.  She is now up to 3.0 mg of mirapex ER a day.   She states that she doesn't feel as good as she did when she was on it previously, but she is doing better than she was on the previous medications.  She has some trouble getting off of the sofa.  No falls.  Is doing circuit II class for PD as well as [employer] but this only takes up 2 days a  week and admits isn't doing CV work the remainder of the days.  Husband asks about finding a counselor for her anxiety, which is primarily associated with driving.  Her compulsive shopping has not gotten worse with higher dose of mirapex (feels that it is better as they just got off a cruise and she was previously shopping for the cruise)  11/04/14 update:  Pt returns for f/u, accompanied by her husband who supplements the history.  She did the [employer] since our last visit.  She does state that she was the last one in but she was able to finish.  I had got a call from the rehab unit that the patient had wanted to hold therapy because of fibromyalgia but the patient states that she wanted to continue therapy and perhaps she cancelled one day and not the entire therapy.  She asks me to renew the order.  Just a few days ago (2 nights) the patient backed down on the mirapex from 3.75 mg to 3.0 mg due to EDS and she wasn't sure if it was due to  the mirapex so she wanted to try the lower dosage.  She would like to get a scooter and asks me about that today  02/05/15 update:  The patient is accompanied by her husband who supplements the history.  She is on Mirapex ER 3.0 mg daily.  She thinks that she wants to try again potentially going up on it.  She feels a little slower than usual.  She is doing PWR classes.     She denies any falls.  No hallucinations.  No lightheadedness or near syncope.  No dyskinesia.  She is supposed to be on Cymbalta for both peripheral neuropathy and fibromyalgia.  She ran out and is now not doing well.  She has been out of it for 2 weeks.  The patient thinks that her fibromyalgia got out of control, she quit exercising so vigorously and then her Parkinson's got worse.  She takes clonazepam as needed for insomnia (3-4 days per week).  They're planning several cruises and several vacations in the near future.  05/26/15 update:  The patient is following up today, accompanied by her husband who  supplements the history.  Last visit, I increased her Mirapex to 3.75 mg daily and restarted her Cymbalta for examination of fibromyalgia and peripheral neuropathy.  She thinks that the cymbalta helped but she thinks the increased mirapex caused more tremor.  She also thinks it causes EDS.  She is falling asleep now in the afternoon and having to take the klonopin late at night to help her sleep (1 mg).  She states that doesn't give her hang over effect.  She wants to go back down again to 3.0 mg of the mirapex.      PREVIOUS MEDICATIONS: Mirapex and azilect; levodopa for a few weeks to "prove" benefit; requip  ALLERGIES:   Allergies  Allergen Reactions  . Sporanox [Itraconazole]   . Tylenol [Acetaminophen]     CURRENT MEDICATIONS:  Current Outpatient Prescriptions on File Prior to Visit  Medication Sig Dispense Refill  . ALPRAZolam (XANAX) 0.25 MG tablet Take 0.25 mg by mouth 1 day or 1 dose.    Marland Kitchen aspirin 325 MG tablet Take 325 mg by mouth daily.    Marland Kitchen atorvastatin (LIPITOR) 10 MG tablet Take 10 mg by mouth daily.    . cholecalciferol (VITAMIN D) 1000 UNITS tablet Take 1,000 Units by mouth daily. Two daily    . clonazePAM (KLONOPIN) 1 MG tablet Take 1 tablet (1 mg total) by mouth at bedtime. 90 tablet 1  . co-enzyme Q-10 30 MG capsule Take 30 mg by mouth daily. Taking 60mg     . CYMBALTA 60 MG capsule Take 1 capsule (60 mg total) by mouth daily. 90 capsule 3  . hydrochlorothiazide (HYDRODIURIL) 25 MG tablet Take 25 mg by mouth daily.    . Magnesium 250 MG TABS Take by mouth 2 (two) times daily.    . methocarbamol (ROBAXIN) 500 MG tablet TAKE 1 TABLET EVERY 4 HOURS AS NEEDED FOR MUSCLE SPASM  1  . Multiple Vitamins-Minerals (ICAPS PO) Take by mouth.    . raloxifene (EVISTA) 60 MG tablet Take 60 mg by mouth daily.     No current facility-administered medications on file prior to visit.    PAST MEDICAL HISTORY:   Past Medical History  Diagnosis Date  . Osteopenia   . Hypertension   .  Parkinson disease   . Fibromyalgia   . Hypercholesteremia     controlled on medication  . Melanoma     PAST SURGICAL HISTORY:   Past Surgical History  Procedure Laterality Date  . Tonsillectomy    . Tubal ligation      SOCIAL HISTORY:   Social History   Social History  . Marital Status: Married    Spouse Name: N/A  . Number of Children: N/A  . 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 Smoking status: Former Smoker    Quit date: 09/29/1997  . Smokeless tobacco: Never Used     Comment: chews Nicorette, 10-12/day  . Alcohol Use: Yes     Comment: Occ  . Drug Use: No  . Sexual Activity: Not on file  Other Topics Concern  . Not on file   Social History Narrative    FAMILY HISTORY:   Family Status  Relation Status Death Age  . Mother Deceased     colon CA  . Father Alive     healthy  . Brother Alive     healthy  . Sister Alive     4, healthy  . Child Alive     healthy    ROS:  A complete 10 system review of systems was obtained and was unremarkable apart from what is mentioned above.  PHYSICAL EXAMINATION:    VITALS:   Filed Vitals:   05/26/15 1538  BP: 120/62  Pulse: 107  Height: 5\' 5"  (1.651 m)  Weight: 182 lb (82.555 kg)    GEN:  The patient appears stated age and is in NAD. HEENT:  Normocephalic, atraumatic.  The mucous membranes are moist. The superficial temporal arteries are without ropiness or tenderness. CV: RRR Lungs:  CTAB Neck: No bruits   Neurological examination:  Orientation: The patient is alert and oriented x3. Fund of knowledge is appropriate.  Recent and remote memory are intact.  Attention and concentration are normal.    Able to name objects and repeat phrases.  Movement examination: Tone: There is moderate increased tone in the left upper and lower extremity.  There is normal tone on the right (improved) Abnormal movements: No tremor noted  today Coordination:  There is decreased rapid alternating movements on the left. Gait and Station: The patient has no difficulty arising out of a deep-seated chair without the use of the hands. The patient's stride length is good today.  Arm swing is good.  She has a negative pull test.  LABS:  Lab Results  Component Value Date   TSH 0.80 01/15/2013   Lab Results  Component Value Date   [id number] 952 01/15/2013    SPEP/UPEP negative.    Lab Results  Component Value Date   FOLATE 22.1 01/15/2013     ASSESSMENT/PLAN:  1.  Parkinsonism.  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    -We discussed the diagnosis as well as pathophysiology of the disease.  We discussed treatment options as well as prognostic indicators.  Patient education was provided.  -She is a little less rigid on Mirapex ER 3.75 mg daily, but she thinks that she is no better on the medication and she thinks it is causing hypersomnolence.  She wants to back down to 3.0 mg of the medication.  Unfortunately, she has gone up and down with the medication, dosage wise over time.  I'm not sure that we have made any real progress.     -talked to her about re-trying levodopa and she is willing but she wants to try the brand name.  She thinks that is what she first tried and didn't have dyskinesia.  When we tried it previously with me, she had dyskinesia at low dose.  She has tried rytary in the past as well.  Will try Sinemet DAW 25/100, 1/2 po tid.    -We again talked about the importance of exercise.   2.  probable peripheral neuropathy, idiopathic.  -Back on Cymbalta both for peripheral neuropathy as well as fibromyalgia. 3.  history of melanoma.  -There is some old literature that suggests that the addition of levodopa can increase risk of return melanoma, but new literature suggests that this is not the etiology.   -She just had her skin exam  in 03/2014 4.  history of tobacco  abuse.  -While the patient has given up smoking, she is using quite a bit of Nicorette gum per day.  I talked to her about the fact that she really needs to stop this as well.  She is just not ready, stating that she has done this since 2010 and doesn't feel the need to quit. 5.  Insomnia with some RBD  -She is doing fine with clonazepam.  She is on a higher dosage than I originally prescribed and I told her that I did not want her going higher than this as she is now taking every night 6.  Anxiety, situational (with driving)  -talked about CBT.  She does not want a referral right now 7.  I will see her back in follow-up in the next 3 or 4 months, sooner should new neurologic issues arise.  Much greater than 50% of the 45 minute visit in counseling once again.

## 2015-05-29 ENCOUNTER — Telehealth: Payer: Self-pay | Admitting: Neurology

## 2015-05-29 NOTE — Telephone Encounter (Signed)
[facility] called in regards to PT's medication Mirapex and would like a call back at [phone]

## 2015-05-29 NOTE — Telephone Encounter (Signed)
Spoke with [facility] and made aware patient needs brand name Mirapex.

## 2015-06-15 ENCOUNTER — Ambulatory Visit: Payer: Medicare Other | Attending: Neurology | Admitting: Occupational Therapy

## 2015-06-15 DIAGNOSIS — R293 Abnormal posture: Secondary | ICD-10-CM | POA: Insufficient documentation

## 2015-06-15 DIAGNOSIS — R279 Unspecified lack of coordination: Secondary | ICD-10-CM | POA: Insufficient documentation

## 2015-06-15 DIAGNOSIS — R2681 Unsteadiness on feet: Secondary | ICD-10-CM | POA: Insufficient documentation

## 2015-06-15 DIAGNOSIS — R278 Other lack of coordination: Secondary | ICD-10-CM

## 2015-06-15 DIAGNOSIS — R29898 Other symptoms and signs involving the musculoskeletal system: Secondary | ICD-10-CM | POA: Insufficient documentation

## 2015-06-15 DIAGNOSIS — R258 Other abnormal involuntary movements: Secondary | ICD-10-CM | POA: Diagnosis not present

## 2015-06-15 NOTE — Therapy (Signed)
[facility] [address] [place], [place], [address] Phone: [phone]   Fax:  [phone]  Occupational Therapy Treatment  Patient Details  Name: Morgan [name] MRN: [id number] Date of Birth: March 24, 1951 No Data Recorded  Encounter Date: 06/15/2015      OT End of Session - 06/15/15 1534    Visit Number 2   Number of Visits 13   Date for OT Re-Evaluation 07/18/15   Authorization Type Medicare primary, BCBS secondary   Authorization Time Period cert. period 05/19/15-07/18/15   Authorization - Visit Number 2   Authorization - Number of Visits 10  G   OT Start Time 0867   OT Stop Time 1400   OT Time Calculation (min) 43 min   Activity Tolerance Patient tolerated treatment well   Behavior During Therapy WFL for tasks assessed/performed      Past Medical History  Diagnosis Date  . Osteopenia   . Hypertension   . Parkinson disease   . Fibromyalgia   . Hypercholesteremia     controlled on medication  . Melanoma     Past Surgical History  Procedure Laterality Date  . Tonsillectomy    . Tubal ligation      There were no vitals filed for this visit.  Visit Diagnosis:  Bradykinesia  Rigidity  Decreased coordination  Unsteadiness  Abnormal posture      Subjective Assessment - 06/15/15 1531    Subjective  "This is a workout, but it seems ok right now" (no pain)   Pertinent History fibromyalgia, PD diagnosis approx 6 years ago   Patient Stated Goals improve ability to don socks   Currently in Pain? No/denies                         PWR [facility]) - 06/15/15 1538    PWR! exercises Moves in [place];Moves in prone   PWR! Up x10   PWR! Rock x10 to each side   PWR! Twist x3 to each side due to fatigue   PWR! Step x10 to each side   Comments Pt to perform PWR! twist first at home due to UE fatigue and if needed perform each with 5 reps initially with goal of 10.  Pt performed above in  quadraped with min-mod cues.   PWR! Up x10   PWR! Rock x10   PWR! Twist x10   PWR! Step x5   Comments Performed above in prone with min-mod cues             OT Education - 06/15/15 1532    Education Details Began discussing/reviewing and updating previous HEPs.  Replaced previous seated, prone, quadraped PWR! HEP with PWR! Moves in [place] and prone.    Person(s) Educated Patient   Methods Explanation;Demonstration;Verbal cues;Handout;Tactile cues   Comprehension Verbalized understanding;Returned demonstration;Verbal cues required  min-mod for big amplitude movements and coordination of exercises          OT Short Term Goals - 05/19/15 1628    OT SHORT TERM GOAL #1   Title Pt will be independent with updated PD-specific HEP.--check STGs 07/03/15   Time 3   Period Weeks   Status New   OT SHORT TERM GOAL #2   Title Pt will decr bradykinesia and incr activity tolerance in order to complete grooming tasks without rest break.   Time 3   Period Weeks   Status New           OT  Long Term Goals - 05/19/15 1629    OT LONG TERM GOAL #1   Title Pt will verbalize understanding of adaptive strategies/AE to incr ease, safety with ADLs/IADLs prn.--check LTGs 07/18/15   Time 6   Period Weeks   Status New   OT LONG TERM GOAL #2   Title Pt will report incr ease with donning socks.   Time 6   Period Weeks   Status New   OT LONG TERM GOAL #3   Title Pt will improve coordination for ADLs as shown by improving time on 9-hole peg test by at least 5sec with LUE.   Baseline 31.38sec   Time 6   Period Weeks   Status New   OT LONG TERM GOAL #4   Title Pt will improve dressing ease with decr time as shown by completing PPT#4 in 15sec or less.   Baseline 17.81sec   Time 6   Period Weeks   Status New   OT LONG TERM GOAL #5   Title Pt will improve LUE coordination/functional reaching as shown by improving score on box and blocks test by at least 5.   Baseline 39 blocks   Time 6    Period Weeks   Status New               Plan - 06/15/15 1536    Clinical Impression Statement Pt fatigued quickly with updated HEP but reported no pain.  Pt verbalized understanding of updates to HEP and returned demo with min-mod cueing for big amplitude movements. (improved performance with cues)   Plan continue updating HEP and exercise flowsheet (coordination, PWR! hands)   Consulted and Agree with Plan of Care Patient        Problem List Patient Active Problem List   Diagnosis Date Noted  . Fibromyalgia 01/16/2014  . Cholelithiasis 04/25/2013  . Paralysis agitans ([place]) 01/15/2013  . Unspecified hereditary and idiopathic peripheral neuropathy 01/15/2013    [facility] 06/15/2015, 4:43 PM  [facility] [address] [place], [place], [address] Phone: [phone]   Fax:  [phone]  Name: Morgan [name] MRN: [id number] Date of Birth: [birth year]  Vianne Bulls, OTR/L 06/15/2015 4:44 PM

## 2015-06-16 ENCOUNTER — Encounter: Payer: Federal, State, Local not specified - PPO | Admitting: Occupational Therapy

## 2015-06-18 ENCOUNTER — Encounter: Payer: Self-pay | Admitting: Occupational Therapy

## 2015-06-18 ENCOUNTER — Ambulatory Visit: Payer: Medicare Other | Admitting: Occupational Therapy

## 2015-06-18 DIAGNOSIS — R279 Unspecified lack of coordination: Secondary | ICD-10-CM | POA: Diagnosis not present

## 2015-06-18 DIAGNOSIS — R29898 Other symptoms and signs involving the musculoskeletal system: Secondary | ICD-10-CM

## 2015-06-18 DIAGNOSIS — R278 Other lack of coordination: Secondary | ICD-10-CM

## 2015-06-18 DIAGNOSIS — R258 Other abnormal involuntary movements: Secondary | ICD-10-CM

## 2015-06-18 DIAGNOSIS — R293 Abnormal posture: Secondary | ICD-10-CM | POA: Diagnosis not present

## 2015-06-18 DIAGNOSIS — R2681 Unsteadiness on feet: Secondary | ICD-10-CM | POA: Diagnosis not present

## 2015-06-18 NOTE — Patient Instructions (Addendum)
PWR! Hands  With arms stretched out in front of you (elbows straight), perform the following:  PWR! Rock: Move wrists up and down [employer]! Twist: Twist palms up and down BIG  Then, start with elbows bent and hands closed.  PWR! Step: Touch index finger to thumb while keeping other fingers straight. Flick fingers out BIG (thumb out/straighten fingers). Repeat with other fingers. (Step your thumb to each finger).  PWR! Hands: Push hands out BIG. Elbows straight, wrists up, fingers open and spread apart BIG. (Can also perform by pushing down on table, chair, knees. Push above head, out to the side, behind you, in front of you.)   ** Make each movement big and deliberate so that you feel the movement.  Perform at least 10 repetitions with weekly schedule, but perform PWR! hands throughout the day when you are having trouble using your hands (picking up/manipulating small objects, writing, eating, typing, sewing, buttoning, etc.).    Coordination Exercises  Perform the following exercises for 20 minutes 1 times per week (with schedule). Perform with both hand(s). Perform using big movements.   Flipping Cards: Place deck of cards on the table. Flip cards over by opening your hand big to grasp and then turn your palm up big.  Deal cards: Hold 1/2 or whole deck in your hand. Use thumb to push card off top of deck with one big push.  Flip card between each finger.  Rotate ball with fingertips: Pick up with fingers/thumb and move as much as you can with each turn/movement (clockwise and counter-clockwise).  Toss ball from one hand to the other: Toss big/high.  Toss ball in the air and catch with the same hand: Toss big/high.  Rotate 2 golf balls in your hand: Both directions.  Pick up 5-10 coins one at a time and hold in palm. Pick up with big, intentional movements. Do not drag coin to the edge.  Then, move coins from palm to fingertips one at a time to stack.  Fasten nuts/bolts or  put on bottle caps: Turn as much/as big as you can with each turn.

## 2015-06-18 NOTE — Therapy (Addendum)
[facility] [address] [place], [place], [address] Phone: [phone]   Fax:  [phone]  Occupational Therapy Treatment  Patient Details  Name: Morgan [name] MRN: [id number] Date of Birth: [birth year] No Data Recorded  Encounter Date: 06/18/2015      OT End of Session - 06/18/15 1423    Visit Number 3   Number of Visits 13   Date for OT Re-Evaluation 07/18/15   Authorization Type Medicare primary, BCBS secondary   Authorization Time Period cert. period 05/19/15-07/18/15   Authorization - Visit Number 3   Authorization - Number of Visits 10  G   OT Start Time 3825   OT Stop Time 0539   OT Time Calculation (min) 40 min   Activity Tolerance Patient tolerated treatment well   Behavior During Therapy WFL for tasks assessed/performed      Past Medical History  Diagnosis Date  . Osteopenia   . Hypertension   . Parkinson disease ([place])   . Fibromyalgia   . Hypercholesteremia     controlled on medication  . Melanoma [facility])     Past Surgical History  Procedure Laterality Date  . Tonsillectomy    . Tubal ligation      There were no vitals filed for this visit.  Visit Diagnosis:  Bradykinesia  Rigidity  Decreased coordination      Subjective Assessment - 06/18/15 1421    Subjective  "I was feeling a little sore after last time, but not too bad"   Pertinent History fibromyalgia, PD diagnosis approx 6 years ago   Patient Stated Goals improve ability to don socks   Currently in Pain? No/denies                      Pt instructed in use of PWR! Hands as strategy to assist for ADLs/fine motor tasks.         OT Education - 06/18/15 1422    Education Details PWR! hands HEP, coordination HEP   Person(s) Educated Patient   Methods Explanation;Verbal cues   Comprehension Verbalized understanding;Returned demonstration  min cueing for big amplitude movements          OT Short  Term Goals - 05/19/15 1628    OT SHORT TERM GOAL #1   Title Pt will be independent with updated PD-specific HEP.--check STGs 07/03/15   Time 3   Period Weeks   Status New   OT SHORT TERM GOAL #2   Title Pt will decr bradykinesia and incr activity tolerance in order to complete grooming tasks without rest break.   Time 3   Period Weeks   Status New           OT Long Term Goals - 05/19/15 1629    OT LONG TERM GOAL #1   Title Pt will verbalize understanding of adaptive strategies/AE to incr ease, safety with ADLs/IADLs prn.--check LTGs 07/18/15   Time 6   Period Weeks   Status New   OT LONG TERM GOAL #2   Title Pt will report incr ease with donning socks.   Time 6   Period Weeks   Status New   OT LONG TERM GOAL #3   Title Pt will improve coordination for ADLs as shown by improving time on 9-hole peg test by at least 5sec with LUE.   Baseline 31.38sec   Time 6   Period Weeks   Status New   OT LONG TERM GOAL #4  Title Pt will improve dressing ease with decr time as shown by completing PPT#4 in 15sec or less.   Baseline 17.81sec   Time 6   Period Weeks   Status New   OT LONG TERM GOAL #5   Title Pt will improve LUE coordination/functional reaching as shown by improving score on box and blocks test by at least 5.   Baseline 39 blocks   Time 6   Period Weeks   Status New               Plan - 06/18/15 1424    Clinical Impression Statement Pt performed well with min v.c. for large amplitude movements with coordination activities.   Plan check/review HEP, begin strategies for ADLs   OT Home Exercise Plan Education issued:  PWR! moves in quadraped and prone, 06/18/15 PWR! hands HEP and coordination HEP   Consulted and Agree with Plan of Care Patient        Problem List Patient Active Problem List   Diagnosis Date Noted  . Fibromyalgia 01/16/2014  . Cholelithiasis 04/25/2013  . Paralysis agitans ([place]) 01/15/2013  . Unspecified hereditary and idiopathic  peripheral neuropathy 01/15/2013    [facility] 06/18/2015, 3:23 PM  [facility] [address] [place], [place], [address] Phone: [phone]   Fax:  [phone]  Name: Morgan [name] MRN: [id number] Date of Birth: 1950/09/12  Vianne Bulls, OTR/L 06/18/2015 3:23 PM

## 2015-06-23 ENCOUNTER — Ambulatory Visit: Payer: Medicare Other | Admitting: Occupational Therapy

## 2015-06-24 ENCOUNTER — Telehealth: Payer: Self-pay | Admitting: Neurology

## 2015-06-24 MED ORDER — SINEMET 25-100 MG PO TABS: 1.0000 | ORAL_TABLET | Freq: Three times a day (TID) | ORAL | Status: DC

## 2015-06-24 NOTE — Telephone Encounter (Signed)
RX sent to mail order pharmacy as requested. Patient made aware.

## 2015-06-24 NOTE — Telephone Encounter (Signed)
Pt called to request a 90 day supply of med/Simenet?//call back @ [phone]

## 2015-06-25 ENCOUNTER — Ambulatory Visit: Payer: Medicare Other | Admitting: Occupational Therapy

## 2015-06-25 ENCOUNTER — Encounter: Payer: Self-pay | Admitting: Occupational Therapy

## 2015-06-25 DIAGNOSIS — R2681 Unsteadiness on feet: Secondary | ICD-10-CM | POA: Diagnosis not present

## 2015-06-25 DIAGNOSIS — R278 Other lack of coordination: Secondary | ICD-10-CM

## 2015-06-25 DIAGNOSIS — R279 Unspecified lack of coordination: Secondary | ICD-10-CM

## 2015-06-25 DIAGNOSIS — R293 Abnormal posture: Secondary | ICD-10-CM | POA: Diagnosis not present

## 2015-06-25 DIAGNOSIS — R258 Other abnormal involuntary movements: Secondary | ICD-10-CM | POA: Diagnosis not present

## 2015-06-25 DIAGNOSIS — R29898 Other symptoms and signs involving the musculoskeletal system: Secondary | ICD-10-CM

## 2015-06-25 NOTE — Therapy (Signed)
[facility] [address] [place], [place], [address] Phone: [phone]   Fax:  [phone]  Occupational Therapy Treatment  Patient Details  Name: Morgan [name] MRN: [id number] Date of Birth: 10-08-1950 No Data Recorded  Encounter Date: 06/25/2015      OT End of Session - 06/25/15 1110    Visit Number 4   Number of Visits 13   Date for OT Re-Evaluation 07/18/15   Authorization Type Medicare primary, BCBS secondary   Authorization Time Period cert. period 05/19/15-07/18/15, wk 2/6   Authorization - Visit Number 4   Authorization - Number of Visits 10  G   OT Start Time 3546   OT Stop Time 1145   OT Time Calculation (min) 40 min   Activity Tolerance Patient tolerated treatment well   Behavior During Therapy WFL for tasks assessed/performed      Past Medical History  Diagnosis Date  . Osteopenia   . Hypertension   . Parkinson disease ([place])   . Fibromyalgia   . Hypercholesteremia     controlled on medication  . Melanoma [facility])     Past Surgical History  Procedure Laterality Date  . Tonsillectomy    . Tubal ligation      There were no vitals filed for this visit.  Visit Diagnosis:  Bradykinesia  Rigidity  Decreased coordination  Abnormal posture  Unsteadiness      Subjective Assessment - 06/25/15 1108    Subjective  Pt reports not feeling well and not doing much exercise the last few days.   Pertinent History fibromyalgia, PD diagnosis approx 6 years ago   Patient Stated Goals improve ability to don socks   Currently in Pain? Yes   Pain Score 6    Pain Location Leg   Pain Orientation Right;Left   Pain Descriptors / Indicators Aching   Pain Type Chronic pain   Aggravating Factors  ?weather   Pain Relieving Factors meds                      OT Treatments/Exercises (OP) - 06/25/15 0001    ADLs   UB Dressing Practiced donning/doffing jacket.  Pt performed without  difficulty using good technique.   LB Dressing Practiced donning/doffing socks with sock aide and without using adaptive strategy.  Pt verbalized understanding and returned demo.           PWR [facility]) - 06/25/15 1724    PWR! exercises Moves in supine   PWR! Up x10   PWR! Rock x10 to each side   PWR! Twist x10 to each side   PWR! Step x10 to each side with hip lift   Comments in supine with min v.c.             OT Education - 06/25/15 1722    Education Details supine PWR! moves (basic 4), use of sock aide/strategies for donning socks, recommended doing exercises that are decr intensity during days where fibromyalgia is bother her more   Person(s) Educated Patient   Methods Explanation;Demonstration;Verbal cues;Handout   Comprehension Verbalized understanding;Returned demonstration;Verbal cues required          OT Short Term Goals - 05/19/15 1628    OT SHORT TERM GOAL #1   Title Pt will be independent with updated PD-specific HEP.--check STGs 07/03/15   Time 3   Period Weeks   Status New   OT SHORT TERM GOAL #2   Title Pt will decr bradykinesia and incr  activity tolerance in order to complete grooming tasks without rest break.   Time 3   Period Weeks   Status New           OT Long Term Goals - 05/19/15 1629    OT LONG TERM GOAL #1   Title Pt will verbalize understanding of adaptive strategies/AE to incr ease, safety with ADLs/IADLs prn.--check LTGs 07/18/15   Time 6   Period Weeks   Status New   OT LONG TERM GOAL #2   Title Pt will report incr ease with donning socks.   Time 6   Period Weeks   Status New   OT LONG TERM GOAL #3   Title Pt will improve coordination for ADLs as shown by improving time on 9-hole peg test by at least 5sec with LUE.   Baseline 31.38sec   Time 6   Period Weeks   Status New   OT LONG TERM GOAL #4   Title Pt will improve dressing ease with decr time as shown by completing PPT#4 in 15sec or less.   Baseline 17.81sec   Time 6    Period Weeks   Status New   OT LONG TERM GOAL #5   Title Pt will improve LUE coordination/functional reaching as shown by improving score on box and blocks test by at least 5.   Baseline 39 blocks   Time 6   Period Weeks   Status New               Plan - 06/25/15 1726    Clinical Impression Statement Pt progressing towards goals and returned demo strategies for donning socks.   OT Home Exercise Plan Education issued:  PWR! moves in quadraped and prone, 06/18/15 PWR! hands HEP and coordination HEP; 06/25/15 PWR! moves in supine, strategies for donning socks/use of sock aide   Consulted and Agree with Plan of Care Patient        Problem List Patient Active Problem List   Diagnosis Date Noted  . Fibromyalgia 01/16/2014  . Cholelithiasis 04/25/2013  . Paralysis agitans ([place]) 01/15/2013  . Unspecified hereditary and idiopathic peripheral neuropathy 01/15/2013    [facility] 06/25/2015, 5:30 PM  [place] [address] [place], [place], [address] Phone: [phone]   Fax:  [phone]  Name: Morgan [name] MRN: [id number] Date of Birth: [birth year]  Vianne Bulls, OTR/L 06/25/2015 5:30 PM

## 2015-06-30 ENCOUNTER — Ambulatory Visit: Payer: Medicare Other | Attending: Neurology | Admitting: Occupational Therapy

## 2015-06-30 DIAGNOSIS — R2681 Unsteadiness on feet: Secondary | ICD-10-CM | POA: Insufficient documentation

## 2015-06-30 DIAGNOSIS — R278 Other lack of coordination: Secondary | ICD-10-CM

## 2015-06-30 DIAGNOSIS — R29898 Other symptoms and signs involving the musculoskeletal system: Secondary | ICD-10-CM

## 2015-06-30 DIAGNOSIS — R279 Unspecified lack of coordination: Secondary | ICD-10-CM | POA: Insufficient documentation

## 2015-06-30 DIAGNOSIS — R293 Abnormal posture: Secondary | ICD-10-CM | POA: Insufficient documentation

## 2015-06-30 DIAGNOSIS — R258 Other abnormal involuntary movements: Secondary | ICD-10-CM

## 2015-06-30 NOTE — Therapy (Signed)
[facility] [address] [place], [place], [address] Phone: [phone]   Fax:  [phone]  Occupational Therapy Treatment  Patient Details  Name: Morgan [name] MRN: [id number] Date of Birth: 07/29/1951 No Data Recorded  Encounter Date: 06/30/2015      OT End of Session - 06/30/15 1113    Visit Number 5   Number of Visits 13   Date for OT Re-Evaluation 07/18/15   Authorization Type Medicare primary, BCBS secondary   Authorization Time Period cert. period 05/19/15-07/18/15, wk 3/6   Authorization - Visit Number 5   Authorization - Number of Visits 10  G   OT Start Time 1106   OT Stop Time 1145   OT Time Calculation (min) 39 min   Activity Tolerance Patient tolerated treatment well   Behavior During Therapy WFL for tasks assessed/performed      Past Medical History  Diagnosis Date  . Osteopenia   . Hypertension   . Parkinson disease ([place])   . Fibromyalgia   . Hypercholesteremia     controlled on medication  . Melanoma [facility])     Past Surgical History  Procedure Laterality Date  . Tonsillectomy    . Tubal ligation      There were no vitals filed for this visit.  Visit Diagnosis:  Bradykinesia  Rigidity  Decreased coordination  Abnormal posture      Subjective Assessment - 06/30/15 1308    Subjective  Pt reports that she has not had time to do as much exercise today.  Pt reports donning socks fine today and that she ordered a sock aide.   Pertinent History fibromyalgia, PD diagnosis approx 6 years ago   Patient Stated Goals improve ability to don socks   Currently in Pain? No/denies                         PWR [facility]) - 06/30/15 1117    PWR! exercises Moves in supine   PWR! Up x10   PWR! Rock x10 to each side   PWR! Twist x10 to each side   PWR! Step x10 to each side   Comments in supine with occasional min v.c. for big movements   PWR! Up x10   PWR! Rock x10 to each  side   PWR! Twist x10 to each side   PWR! Step x8 to each side   Comments in prone with min v.c.4   Reaching Comments functional reachin in sitting with reaching laterally and across body for trunk rotation, lateral wt. shift and PWR! hands to grasp cylinder objects in diagonal patterns (floor>overhead) with set up for big movements               OT Short Term Goals - 05/19/15 1628    OT SHORT TERM GOAL #1   Title Pt will be independent with updated PD-specific HEP.--check STGs 07/03/15   Time 3   Period Weeks   Status New   OT SHORT TERM GOAL #2   Title Pt will decr bradykinesia and incr activity tolerance in order to complete grooming tasks without rest break.   Time 3   Period Weeks   Status New           OT Long Term Goals - 05/19/15 1629    OT LONG TERM GOAL #1   Title Pt will verbalize understanding of adaptive strategies/AE to incr ease, safety with ADLs/IADLs prn.--check LTGs 07/18/15   Time  6   Period Weeks   Status New   OT LONG TERM GOAL #2   Title Pt will report incr ease with donning socks.   Time 6   Period Weeks   Status New   OT LONG TERM GOAL #3   Title Pt will improve coordination for ADLs as shown by improving time on 9-hole peg test by at least 5sec with LUE.   Baseline 31.38sec   Time 6   Period Weeks   Status New   OT LONG TERM GOAL #4   Title Pt will improve dressing ease with decr time as shown by completing PPT#4 in 15sec or less.   Baseline 17.81sec   Time 6   Period Weeks   Status New   OT LONG TERM GOAL #5   Title Pt will improve LUE coordination/functional reaching as shown by improving score on box and blocks test by at least 5.   Baseline 39 blocks   Time 6   Period Weeks   Status New               Plan - 06/30/15 1309    Clinical Impression Statement Pt progressing towards goals with good understanding of PWR! moves in prone and supine.   Plan review quadraped PWR! moves, strategies for ADLs, check STGs   OT Home  Exercise Plan Education issued:  PWR! moves in quadraped and prone, 06/18/15 PWR! hands HEP and coordination HEP; 06/25/15 PWR! moves in supine, strategies for donning socks/use of sock aide   Consulted and Agree with Plan of Care Patient        Problem List Patient Active Problem List   Diagnosis Date Noted  . Fibromyalgia 01/16/2014  . Cholelithiasis 04/25/2013  . Paralysis agitans ([place]) 01/15/2013  . Unspecified hereditary and idiopathic peripheral neuropathy 01/15/2013    [facility] 06/30/2015, 1:11 PM  [facility] [address] [place], [place], [address] Phone: [phone]   Fax:  [phone]  Name: Morgan [name] MRN: [id number] Date of Birth: 06-18-1951  Vianne Bulls, OTR/L 06/30/2015 1:11 PM

## 2015-07-02 ENCOUNTER — Encounter: Payer: Self-pay | Admitting: Occupational Therapy

## 2015-07-02 ENCOUNTER — Ambulatory Visit: Payer: Medicare Other | Admitting: Occupational Therapy

## 2015-07-02 DIAGNOSIS — R279 Unspecified lack of coordination: Secondary | ICD-10-CM | POA: Diagnosis not present

## 2015-07-02 DIAGNOSIS — R29898 Other symptoms and signs involving the musculoskeletal system: Secondary | ICD-10-CM

## 2015-07-02 DIAGNOSIS — R2681 Unsteadiness on feet: Secondary | ICD-10-CM

## 2015-07-02 DIAGNOSIS — R293 Abnormal posture: Secondary | ICD-10-CM

## 2015-07-02 DIAGNOSIS — R278 Other lack of coordination: Secondary | ICD-10-CM

## 2015-07-02 DIAGNOSIS — R258 Other abnormal involuntary movements: Secondary | ICD-10-CM | POA: Diagnosis not present

## 2015-07-02 NOTE — Patient Instructions (Signed)

## 2015-07-02 NOTE — Therapy (Signed)
[facility] [address] [place], [place], [address] Phone: [phone]   Fax:  [phone]  Occupational Therapy Treatment  Patient Details  Name: Morgan [name] MRN: [id number] Date of Birth: [birth year] No Data Recorded  Encounter Date: 07/02/2015      OT End of Session - 07/02/15 1132    Visit Number 6   Number of Visits 13   Date for OT Re-Evaluation 07/18/15   Authorization Type Medicare primary, BCBS secondary   Authorization Time Period cert. period 05/19/15-07/18/15, wk 3/6   Authorization - Visit Number 6   Authorization - Number of Visits 10  G   OT Start Time 7048   OT Stop Time 1145   OT Time Calculation (min) 40 min   Activity Tolerance Patient tolerated treatment well   Behavior During Therapy WFL for tasks assessed/performed      Past Medical History  Diagnosis Date  . Osteopenia   . Hypertension   . Parkinson disease ([place])   . Fibromyalgia   . Hypercholesteremia     controlled on medication  . Melanoma [facility])     Past Surgical History  Procedure Laterality Date  . Tonsillectomy    . Tubal ligation      There were no vitals filed for this visit.  Visit Diagnosis:  Bradykinesia  Rigidity  Decreased coordination  Abnormal posture  Unsteadiness      Subjective Assessment - 07/02/15 1123    Subjective  I have been sore the last 2 days  Pt reports incr ease with donning socks.   Pertinent History fibromyalgia, PD diagnosis approx 6 years ago   Patient Stated Goals improve ability to don socks   Currently in Pain? Yes   Pain Score 7   all over, particularly in R shoulder and legs/feet   Pain Descriptors / Indicators Aching   Aggravating Factors  over activity   Pain Relieving Factors rest, meds                      OT Treatments/Exercises (OP) - 07/02/15 0001    ADLs   LB Dressing Pt able to don L sock with incr ease/no difficulty today.   ADL Education  Given Yes   Big Amplitude Movements Pt instructed in big amplitude movement strategies for ADLs.   Functional Reaching Activities   High Level Functional reaching to place small pegs in vertical pegboard to copy design with each hand with min difficulty with coordination. (for big amplitude movements, coordination, and copied with 100% accuracy for cognitive challenge)           PWR [facility]) - 07/02/15 1157    PWR! exercises Moves in [place]! Up x10   PWR! Rock x10 to each side   PWR! Twist x5 to each side   PWR! Step x10 to each side   Comments in quadraped with min v.c. for maximal effort             OT Education - 07/02/15 1131    Education Details Review PWR! moves in quadraped (basic 4); Big amplitude movements for ADLs (handout given)   Person(s) Educated Patient   Methods Explanation;Verbal cues;Handout   Comprehension Verbalized understanding;Returned demonstration;Verbal cues required  min v.c.          OT Short Term Goals - 07/02/15 1152    OT SHORT TERM GOAL #1   Title Pt will be independent with updated PD-specific HEP.--check STGs 07/03/15  Time 3   Period Weeks   Status Achieved  07/02/15   OT SHORT TERM GOAL #2   Title Pt will decr bradykinesia and incr activity tolerance in order to complete grooming tasks without rest break.   Time 3   Period Weeks   Status New           OT Long Term Goals - 07/02/15 1155    OT LONG TERM GOAL #1   Title Pt will verbalize understanding of adaptive strategies/AE to incr ease, safety with ADLs/IADLs prn.--check LTGs 07/18/15   Time 6   Period Weeks   Status New   OT LONG TERM GOAL #2   Title Pt will report incr ease with donning socks.   Time 6   Period Weeks   Status Achieved  07/02/15   OT LONG TERM GOAL #3   Title Pt will improve coordination for ADLs as shown by improving time on 9-hole peg test by at least 5sec with LUE.   Baseline 31.38sec   Time 6   Period Weeks   Status New   OT LONG  TERM GOAL #4   Title Pt will improve dressing ease with decr time as shown by completing PPT#4 in 15sec or less.   Baseline 17.81sec   Time 6   Period Weeks   Status New   OT LONG TERM GOAL #5   Title Pt will improve LUE coordination/functional reaching as shown by improving score on box and blocks test by at least 5.   Baseline 39 blocks   Time 6   Period Weeks   Status New               Plan - 07/02/15 1135    Clinical Impression Statement Pt progressing towards goals with good understanding  of PWR! moves in quadraped today (only occasion min v.c. for bigger amplitude movements).     Plan check remaining STG, functional reaching in standing   OT Home Exercise Plan Education issued:  PWR! moves in quadraped and prone, 06/18/15 PWR! hands HEP and coordination HEP; 06/25/15 PWR! moves in supine, strategies for donning socks/use of sock aide   Consulted and Agree with Plan of Care Patient        Problem List Patient Active Problem List   Diagnosis Date Noted  . Fibromyalgia 01/16/2014  . Cholelithiasis 04/25/2013  . Paralysis agitans ([place]) 01/15/2013  . Unspecified hereditary and idiopathic peripheral neuropathy 01/15/2013    [facility] 07/02/2015, 12:05 PM  [place] [address] [place], [place], [address] Phone: [phone]   Fax:  [phone]  Name: Morgan [name] MRN: [id number] Date of Birth: 02-19-1951  Vianne Bulls, OTR/L 07/02/2015 12:05 PM

## 2015-07-07 ENCOUNTER — Ambulatory Visit: Payer: Medicare Other | Admitting: Occupational Therapy

## 2015-07-07 ENCOUNTER — Encounter: Payer: Self-pay | Admitting: Occupational Therapy

## 2015-07-07 DIAGNOSIS — R2681 Unsteadiness on feet: Secondary | ICD-10-CM

## 2015-07-07 DIAGNOSIS — R279 Unspecified lack of coordination: Secondary | ICD-10-CM

## 2015-07-07 DIAGNOSIS — R293 Abnormal posture: Secondary | ICD-10-CM

## 2015-07-07 DIAGNOSIS — R278 Other lack of coordination: Secondary | ICD-10-CM

## 2015-07-07 DIAGNOSIS — R29898 Other symptoms and signs involving the musculoskeletal system: Secondary | ICD-10-CM | POA: Diagnosis not present

## 2015-07-07 DIAGNOSIS — R258 Other abnormal involuntary movements: Secondary | ICD-10-CM | POA: Diagnosis not present

## 2015-07-07 NOTE — Therapy (Signed)
[facility] [address] [place], [place], [address] Phone: [phone]   Fax:  [phone]  Occupational Therapy Treatment  Patient Details  Name: Morgan [name] MRN: [id number] Date of Birth: March 03, 1951 No Data Recorded  Encounter Date: 07/07/2015      OT End of Session - 07/07/15 1112    Visit Number 7   Number of Visits 13   Date for OT Re-Evaluation 07/18/15   Authorization Type Medicare primary, BCBS secondary   Authorization Time Period cert. period 05/19/15-07/18/15, wk 4/6   Authorization - Visit Number 7   Authorization - Number of Visits 10  G   OT Start Time 1100   OT Stop Time 1145   OT Time Calculation (min) 45 min   Activity Tolerance Patient tolerated treatment well   Behavior During Therapy WFL for tasks assessed/performed      Past Medical History  Diagnosis Date  . Osteopenia   . Hypertension   . Parkinson disease ([place])   . Fibromyalgia   . Hypercholesteremia     controlled on medication  . Melanoma [facility])     Past Surgical History  Procedure Laterality Date  . Tonsillectomy    . Tubal ligation      There were no vitals filed for this visit.  Visit Diagnosis:  Bradykinesia  Rigidity  Decreased coordination  Abnormal posture  Unsteadiness      Subjective Assessment - 07/07/15 1109    Subjective  Pt reports that she has been taking 1.5 Sinemet in the mornings (instead of 1/2) and 1 pill x2 other times/day and it  has helped her feel better.   Pertinent History fibromyalgia, PD diagnosis approx 6 years ago   Patient Stated Goals improve ability to don socks   Currently in Pain? No/denies                      OT Treatments/Exercises (OP) - 07/07/15 0001    Neurological Re-education Exercises   Other Exercises 1 Tossing/catching scarves with therapist (alternating UEs) with wt. shift for coordination and min v.c. for big movements.  Then ambulating while  tossing/catching 2 scarves (1 in each UE alternating) with focus on coordination (reciprocal pattern/timing) and big movements with mod difficulty with coordination, occasional min difficulty with balance.  Then tossing scarves with each UE using PWR! step strategies for individual finger movements with min-mod cueing/difficulty for coordination and big movements.   Other Exercises 2 Multi-directional movements with step/reach with each UE for coordination, sequencing, dual task, and big movements, wt. shifts.  Pt needed min cues/demo min-mod difficulty initially, but improved with repetition.   Functional Reaching Activities   High Level Functional step and reach with each UE/LE with min v.c. for big movement strategies.                OT Education - 07/07/15 1309    Education Details Strongly recommended pt discuss medication changes with Dr. Carles Collet (as pt reports making changes on her own) and discussed risk of dyskinesias with too much Sinemet (observed mild dyskinesia today during session).     Person(s) Educated Patient   Methods Explanation   Comprehension Verbalized understanding          OT Short Term Goals - 07/07/15 1113    OT SHORT TERM GOAL #1   Title Pt will be independent with updated PD-specific HEP.--check STGs 07/03/15   Time 3   Period Weeks   Status Achieved  07/02/15   OT SHORT TERM GOAL #2   Title Pt will decr bradykinesia and incr activity tolerance in order to complete grooming tasks without rest break.   Time 3   Period Weeks   Status Achieved  07/07/15:  better, no breaks the last few days           OT Long Term Goals - 07/07/15 1329    OT LONG TERM GOAL #1   Title Pt will verbalize understanding of adaptive strategies/AE to incr ease, safety with ADLs/IADLs prn.--check LTGs 07/18/15   Time 6   Period Weeks   Status Achieved   OT LONG TERM GOAL #2   Title Pt will report incr ease with donning socks.   Time 6   Period Weeks   Status Achieved   07/02/15   OT LONG TERM GOAL #3   Title Pt will improve coordination for ADLs as shown by improving time on 9-hole peg test by at least 5sec with LUE.   Baseline 31.38sec   Time 6   Period Weeks   Status New   OT LONG TERM GOAL #4   Title Pt will improve dressing ease with decr time as shown by completing PPT#4 in 15sec or less.   Baseline 17.81sec   Time 6   Period Weeks   Status New   OT LONG TERM GOAL #5   Title Pt will improve LUE coordination/functional reaching as shown by improving score on box and blocks test by at least 5.   Baseline 39 blocks   Time 6   Period Weeks   Status New               Plan - 07/07/15 1317    Clinical Impression Statement Pt progressing towards goals with reports of incr ease with ADLs and understanding of HEP.   Plan check LTGs, anticipate d/c next session   OT Home Exercise Plan Education issued:  PWR! moves in quadraped and prone, 06/18/15 PWR! hands HEP and coordination HEP; 06/25/15 PWR! moves in supine, strategies for donning socks/use of sock aide   Consulted and Agree with Plan of Care Patient        Problem List Patient Active Problem List   Diagnosis Date Noted  . Fibromyalgia 01/16/2014  . Cholelithiasis 04/25/2013  . Paralysis agitans ([place]) 01/15/2013  . Unspecified hereditary and idiopathic peripheral neuropathy 01/15/2013    [facility] 07/07/2015, 1:30 PM  [facility] [address] [place], [place], [address] Phone: [phone]   Fax:  [phone]  Name: Sarely Stracener MRN: [id number] Date of Birth: 11-23-1950  Vianne Bulls, OTR/L 07/07/2015 1:31 PM

## 2015-07-09 ENCOUNTER — Encounter: Payer: Federal, State, Local not specified - PPO | Admitting: Occupational Therapy

## 2015-07-11 ENCOUNTER — Other Ambulatory Visit: Payer: Self-pay | Admitting: Neurology

## 2015-07-13 ENCOUNTER — Ambulatory Visit: Payer: Medicare Other | Admitting: Occupational Therapy

## 2015-07-13 NOTE — Telephone Encounter (Signed)
Clonazepam refill requested. Per last office note- patient to remain on medication. Refill approved and faxed to patient's pharmacy.

## 2015-07-16 ENCOUNTER — Ambulatory Visit: Payer: Medicare Other | Admitting: Occupational Therapy

## 2015-07-16 DIAGNOSIS — R29898 Other symptoms and signs involving the musculoskeletal system: Secondary | ICD-10-CM

## 2015-07-16 DIAGNOSIS — R293 Abnormal posture: Secondary | ICD-10-CM | POA: Diagnosis not present

## 2015-07-16 DIAGNOSIS — R258 Other abnormal involuntary movements: Secondary | ICD-10-CM | POA: Diagnosis not present

## 2015-07-16 DIAGNOSIS — R279 Unspecified lack of coordination: Secondary | ICD-10-CM | POA: Diagnosis not present

## 2015-07-16 DIAGNOSIS — R278 Other lack of coordination: Secondary | ICD-10-CM

## 2015-07-16 DIAGNOSIS — R2681 Unsteadiness on feet: Secondary | ICD-10-CM | POA: Diagnosis not present

## 2015-07-16 NOTE — Therapy (Signed)
[facility] [address] [place], [place], [address] Phone: [phone]   Fax:  [phone]  Occupational Therapy Treatment  Patient Details  Name: Morgan [name] MRN: [id number] Date of Birth: [birth year] No Data Recorded  Encounter Date: 07/16/2015      OT End of Session - 07/16/15 0800    Visit Number 8   Number of Visits 13   Date for OT Re-Evaluation 07/18/15   Authorization Type Medicare primary, BCBS secondary   Authorization Time Period cert. period 05/19/15-07/18/15, wk 4/6   Authorization - Visit Number 8   Authorization - Number of Visits 10  G   OT Start Time 1104   OT Stop Time 1145   OT Time Calculation (min) 41 min   Activity Tolerance Patient tolerated treatment well   Behavior During Therapy WFL for tasks assessed/performed      Past Medical History  Diagnosis Date  . Osteopenia   . Hypertension   . Parkinson disease ([place])   . Fibromyalgia   . Hypercholesteremia     controlled on medication  . Melanoma [facility])     Past Surgical History  Procedure Laterality Date  . Tonsillectomy    . Tubal ligation      There were no vitals filed for this visit.  Visit Diagnosis:  Bradykinesia  Rigidity  Decreased coordination  Abnormal posture      Subjective Assessment - 07/16/15 1107    Subjective  I'm a little sore from class yesterday.   Pertinent History fibromyalgia, PD diagnosis approx 6 years ago   Patient Stated Goals improve ability to don socks   Pain Score 4    Pain Location --  all over   Pain Type Acute pain   Aggravating Factors  over activity   Pain Relieving Factors rest                      OT Treatments/Exercises (OP) - 07/16/15 0001    ADLs   Overall ADLs checked goals and discussed progress. --see goal section.  Recommended follow-up PT,OT,ST PD screens in approx 6-18month and pt agrees with plan.  Reviewed speech aspects to be aware of.  Pt  verbalized understanding   Neurological Re-education Exercises   Other Exercises 1 Functional step and reach to the side to toss scarves with PWR! hands (step) to each side with min v.c. for big amplitude movements.   Reciprocal Movements Aerobic exercise with Arm bike x540m (level 1) with focus/min-mod cues for maintaining at least 40rpms, but pt needed 2 rest breaks and down to 25rpms at times.  Then Sci-fit x5m73mlevel 1 with UEs/LEs with 50-75rpms (goal of 70rpms).                 OT Education - 07/16/15 1151    Education Details Reviewed importance of aerobic exercise, benefits, and principles;  Again, recommended pt discuss changes to medication that pt has made on her own with MD   Person(s) Educated Patient   Methods Explanation   Comprehension Verbalized understanding          OT Short Term Goals - 07/07/15 1113    OT SHORT TERM GOAL #1   Title Pt will be independent with updated PD-specific HEP.--check STGs 07/03/15   Time 3   Period Weeks   Status Achieved  07/02/15   OT SHORT TERM GOAL #2   Title Pt will decr bradykinesia and incr activity tolerance in order to complete  grooming tasks without rest break.   Time 3   Period Weeks   Status Achieved  07/07/15:  better, no breaks the last few days           OT Long Term Goals - 08-01-2015 1115    OT LONG TERM GOAL #1   Title Pt will verbalize understanding of adaptive strategies/AE to incr ease, safety with ADLs/IADLs prn.--check LTGs 07/18/15   Time 6   Period Weeks   Status Achieved   OT LONG TERM GOAL #2   Title Pt will report incr ease with donning socks.   Time 6   Period Weeks   Status Achieved  07/02/15   OT LONG TERM GOAL #3   Title Pt will improve coordination for ADLs as shown by improving time on 9-hole peg test by at least 5sec with LUE.   Baseline 31.38sec   Time 6   Period Weeks   Status Achieved  08-01-15:  24.94sec   OT LONG TERM GOAL #4   Title Pt will improve dressing ease with decr  time as shown by completing PPT#4 in 15sec or less.   Baseline 17.81sec   Time 6   Period Weeks   Status Achieved  112-10-2014:  13.81sec   OT LONG TERM GOAL #5   Title Pt will improve LUE coordination/functional reaching as shown by improving score on box and blocks test by at least 5.   Baseline 39 blocks   Time 6   Period Weeks   Status Achieved  August 01, 2015:  L- 47 blocks, R-52 blocks                Plan - 2015/08/01 1205    Clinical Impression Statement Pt has made good progress with all goals met.   Plan d/c OT   OT Home Exercise Plan Education issued:  PWR! moves in quadraped and prone, 06/18/15 PWR! hands HEP and coordination HEP; 06/25/15 PWR! moves in supine, strategies for donning socks/use of sock aide   Consulted and Agree with Plan of Care Patient          G-Codes - 2015-08-01 1201    Functional Assessment Tool Used L 9-hole peg test:  24.94sec, L box and blocks test- 47 blocks, no difficulty donning socks, now no for grooming tasks, PPT#4 13.91sec   Functional Limitation Self care   Self Care Goal Status ([id number]) At least 1 percent but less than 20 percent impaired, limited or restricted   Self Care Discharge Status [phone]) At least 1 percent but less than 20 percent impaired, limited or restricted     [place]  Visits from Start of Care: 8  Current functional level related to goals / functional outcomes: See above   Remaining deficits: Bradykinesia, rigidity, mild decr coordination, unsteadiness   Education / Equipment: Pt instructed in updated HEP and strategies for ADLs.  Pt verbalized understanding of all education provided.  Plan: Patient agrees to discharge.  Patient goals were met. Patient is being discharged due to meeting the stated rehab goals. Pt would benefit from re-evaluation/occupational therapy screen in approx 6 months to assess for need for further therapy/functional changes due to progressive nature of  diagnosis.  Pt agreed and scheduled screens.  ?????          Problem List Patient Active Problem List   Diagnosis Date Noted  . Fibromyalgia 01/16/2014  . Cholelithiasis 04/25/2013  . Paralysis agitans ([place]) 01/15/2013  . Unspecified hereditary and idiopathic peripheral neuropathy 01/15/2013  [facility] 07/16/2015, 12:06 PM  [facility] [address] [place], [place], [address] Phone: [phone]   Fax:  [phone]  Name: Morgan [name] MRN: [id number] Date of Birth: 1951-05-15  Vianne Bulls, OTR/L 07/16/2015 12:06 PM

## 2015-07-17 DIAGNOSIS — Z1231 Encounter for screening mammogram for malignant neoplasm of breast: Secondary | ICD-10-CM | POA: Diagnosis not present

## 2015-08-06 DIAGNOSIS — F419 Anxiety disorder, unspecified: Secondary | ICD-10-CM | POA: Diagnosis not present

## 2015-08-06 DIAGNOSIS — E7800 Pure hypercholesterolemia, unspecified: Secondary | ICD-10-CM | POA: Diagnosis not present

## 2015-08-06 DIAGNOSIS — N3281 Overactive bladder: Secondary | ICD-10-CM | POA: Diagnosis not present

## 2015-08-06 DIAGNOSIS — I10 Essential (primary) hypertension: Secondary | ICD-10-CM | POA: Diagnosis not present

## 2015-08-06 DIAGNOSIS — Z0000 Encounter for general adult medical examination without abnormal findings: Secondary | ICD-10-CM | POA: Diagnosis not present

## 2015-08-06 DIAGNOSIS — Z1159 Encounter for screening for other viral diseases: Secondary | ICD-10-CM | POA: Diagnosis not present

## 2015-08-06 DIAGNOSIS — Z1389 Encounter for screening for other disorder: Secondary | ICD-10-CM | POA: Diagnosis not present

## 2015-08-06 DIAGNOSIS — F411 Generalized anxiety disorder: Secondary | ICD-10-CM | POA: Diagnosis not present

## 2015-08-06 DIAGNOSIS — G20 Parkinson's disease: Secondary | ICD-10-CM | POA: Diagnosis not present

## 2015-08-06 DIAGNOSIS — M797 Fibromyalgia: Secondary | ICD-10-CM | POA: Diagnosis not present

## 2015-09-03 ENCOUNTER — Encounter: Payer: Self-pay | Admitting: Neurology

## 2015-09-03 ENCOUNTER — Ambulatory Visit (INDEPENDENT_AMBULATORY_CARE_PROVIDER_SITE_OTHER): Payer: Medicare Other | Admitting: Neurology

## 2015-09-03 VITALS — BP 132/84 | HR 98 | Ht 65.0 in | Wt 183.0 lb

## 2015-09-03 DIAGNOSIS — G4752 REM sleep behavior disorder: Secondary | ICD-10-CM | POA: Diagnosis not present

## 2015-09-03 DIAGNOSIS — G609 Hereditary and idiopathic neuropathy, unspecified: Secondary | ICD-10-CM

## 2015-09-03 DIAGNOSIS — G20 Parkinson's disease: Secondary | ICD-10-CM

## 2015-09-03 DIAGNOSIS — M797 Fibromyalgia: Secondary | ICD-10-CM

## 2015-09-03 NOTE — Progress Notes (Signed)
Morgan [name] was seen today in the movement disorders clinic for neurologic consultation at the request of Dr. [name] at [facility] internal medicine.  She is accompanied by her husband who helps to supplement the history.  She has previously seen neurology, both Dr. Krista Blue and Dr. Doy Mince.  I reviewed some of these notes.  The consultation is for the evaluation of Parkinson's disease.  The patient is a 65 y.o. right handed female reports that her first sx was "active dreaming."  However, the symptom that got her to the dr was L greater than R hand tremor, only when moving hands/drying hair.  This was in 2010.  She was started on carbidopa/levodopa just as a "trial" for 2 weeks and then it was stopped because it worked.  She was told that it was diagnostic of PD.  She was then started on the Mirapex ER 1.5 mg in 07/2009. and it was worked up to 3.0 mg.  She reports compulsive shopping (she overspends her "allowance).  She mostly internets shops.  She has always shopped but she has previously not gone over the "allowance."  She plays the lottery and plays more than previous.  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  She tried to get off the medication but it was unsuccessful because of worsening of PD.  She was placed on Azilect.  She has been on it for about 2 years.  She has not noted any benefits.    It was her impression that it made the mirapex last longer.  She feels strongly that the computer work and shopping became an issue because of the azilect.   04/19/13 update:  The patient presents today with her husband who supplements the history.  Once she discontinued the Azilect, she felt that her compulsive shopping got better.  Her husband feels that she is not exercising enough, but the patient estimates that she perhaps exercises 4 days per week, but not to the intensity that her husband would like.  She had one near fall since our last visit, but she was on a trail in [place] with 2 walking sticks and one  of them something to the ground and her husband caught her.  She is still taking clonazepam at night.  Her husband thinks that she takes it too early and falls asleep on the couch, but she denies this.  There have not been hallucinations.  There has not been nausea or vomiting.  She has been slower.   08/13/13 update:  The pt is f/u for PD.  She is accompanied by her husband who supplements the hx.  She is on mirapex 3 mg daily and carbidopa/levodopa 25/100 was added last visit.  She notices that she has more energy and can do more things, although her husband states that she does not necessarily do those things (including exercise). She noted that the feet "wobble" since being on the medication.  She also notices that her shopping "addiction" has returned.  She has had no falls.  No hallucinations.  No near syncope.  09/24/13 update:  The pt is f/u regarding PD.  She is accompanied by her husband who helps to supplement the history.  Last visit, I decreased her pramipexole to 2.25 mg daily secondary to compulsive shopping.  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  They both  agree that this could be pathologic.  She is now on carbidopa/levodopa 25/100, half a tablet 4 times per day.  Dyskinesia in the foot is better.  She is just getting over the flu.  No hallucinations.  She has not started exercising faithfully, primarily because she has not been feeling well.  No falls.    12/24/13 update:  The patient is following up in regards to her Parkinson's disease.  She is accompanied by her husband who helps to supplement the history.  Her Mirapex has been reduced to 1.5 mg daily secondary to compulsive behaviors.  She did call and ask if she could increase it, but I did not want to do that.  She is still having compulsive behaviors (shopping) and her husband  would like her to discontinue the medication altogether.  She is currently on carbidopa/levodopa 25/100, half a tablet 4 times per day.  She takes at approximately 8 AM, noon, 4 PM and 9 PM.  Unfortunately, the patient had dyskinesia at higher dosages.  Nonetheless, she states that she would rather have the dyskinesia than feel like she currently does.  She feels slow and achy and just does not feel like doing anything.  She was not having any hallucinations.  She does state that she signed up for a 5K in January and would like to start training for that.  She is not having any hallucinations.  No falls.  She is still on Cymbalta, 60 mg daily as well as clonazepam for insomnia.  01/16/14 update:  The patient is accompanied by her husband who helps to supplement the history.  Last visit, we started the patient on Rytary, and she is currently on 145 mg 3 times a day.  Unfortunately, the patient reports that she had insomnia on the medication and felt "woozy" and really overall felt better on the carbidopa/levodopa IR and would like to go back to that medication, even though she dyskinesia on low dose.  She asks me if she can wean the Mirapex and restart the levodopa.  [facility].  She is planning on working up to running a 5K at [employer] in January, 2016.  She remains on the Cymbalta for her fibromyalgia.  03/20/14 update:  The patient returns today as a work in, accompanied by her husband who supplements the history.  Much has happened since our last visit.  Last visit, I tried to decrease the patient's Mirapex ER to 0.75 mg daily, primarily because of compulsive behaviors.  I started her on levodopa.  She called me back and wanted to go up on the Mirapex because of cramping in the thigh, but instead I increased her levodopa to 4 times a day.  The cramping did get better, but she called later with dyskinesia.  At that time my partner was on  call and she reluctantly increased the patient's Mirapex back to 1.5 mg daily and decreased patient's levodopa to twice a day dosing.  The patient later called me with continued dyskinesias and wanted to go further up on the Mirapex, but I have been resistant because of her compulsive spending in the past.  Instead, on 03/07/2014 I asked her to try amantadine and called in a prescription for this.  Unfortunately, she stated that she read the side effects and decided that she was not going to pick this up and take it.  She is now only on Mirapex 1.5 mg  daily and levodopa 25/100, one tablet in the morning and half tablet at night.  She would like to go back up on the Mirapex and her husband states that if he has to disable the computer to deal with her compulsive shopping then he will do that.  04/21/14 update:  Pt returns today for follow up, accompanied by her husband who supplements the history.  She is back on mirapex, 1.5 mg daily.  She did not do well on the requip and was pretty insistent on going back on the mirapex despite previous convulsive behaviors.  She wants to go off levodopa 25/100, and she is on it 1/2 bid but often forgets the second dose.  She is able to drive again.   She states that she thinks that she needs to go back up to the 3.0 of the mirapex.  Her husband is not now completely convinced that the mirapex caused compulsive shopping but thinks that boredom did that.  They are getting ready to go on a 14 day [employer] cruise at the end of October.   07/29/14 update:  Pt returns for f/u, accompanied by her husband who supplements the history.  She is now up to 3.0 mg of mirapex ER a day.   She states that she doesn't feel as good as she did when she was on it previously, but she is doing better than she was on the previous medications.  She has some trouble getting off of the sofa.  No falls.  Is doing circuit II class for PD as well as [employer] but this only takes up 2 days a  week and admits isn't doing CV work the remainder of the days.  Husband asks about finding a counselor for her anxiety, which is primarily associated with driving.  Her compulsive shopping has not gotten worse with higher dose of mirapex (feels that it is better as they just got off a cruise and she was previously shopping for the cruise)  11/04/14 update:  Pt returns for f/u, accompanied by her husband who supplements the history.  She did the [employer] since our last visit.  She does state that she was the last one in but she was able to finish.  I had got a call from the rehab unit that the patient had wanted to hold therapy because of fibromyalgia but the patient states that she wanted to continue therapy and perhaps she cancelled one day and not the entire therapy.  She asks me to renew the order.  Just a few days ago (2 nights) the patient backed down on the mirapex from 3.75 mg to 3.0 mg due to EDS and she wasn't sure if it was due to  the mirapex so she wanted to try the lower dosage.  She would like to get a scooter and asks me about that today  02/05/15 update:  The patient is accompanied by her husband who supplements the history.  She is on Mirapex ER 3.0 mg daily.  She thinks that she wants to try again potentially going up on it.  She feels a little slower than usual.  She is doing [employer] classes.     She denies any falls.  No hallucinations.  No lightheadedness or near syncope.  No dyskinesia.  She is supposed to be on Cymbalta for both peripheral neuropathy and fibromyalgia.  She ran out and is now not doing well.  She has been out of it for 2 weeks.  The patient thinks that her fibromyalgia got out of control, she quit exercising so vigorously and then her Parkinson's got worse.  She takes clonazepam as needed for insomnia (3-4 days per week).  They're planning several cruises and several vacations in the near future.  05/26/15 update:  The patient is following up today, accompanied by her husband who  supplements the history.  Last visit, I increased her Mirapex to 3.75 mg daily and restarted her Cymbalta for examination of fibromyalgia and peripheral neuropathy.  She thinks that the cymbalta helped but she thinks the increased mirapex caused more tremor.  She also thinks it causes EDS.  She is falling asleep now in the afternoon and having to take the klonopin late at night to help her sleep (1 mg).  She states that doesn't give her hang over effect.  She wants to go back down again to 3.0 mg of the mirapex.    09/03/15 update:  The patient is following up today, accompanied by her husband who supplements the history.  Last visit, the patient wanted to decrease her pramipexole to 3.0 mg daily.  Last visit, we restarted her Sinemet 25/100, half a tablet 3 times per day.  She states that she ended up d/c her pramipexole all together and increased her Sinemet to 1 po qid.  She feels well as long as she doesn't overdo it.  She remains on Cymbalta, 60 mg daily for peripheral neuropathy and fibromyalgia.  She is also on clonazepam 1 mg nightly for REM behavior disorder and insomnia.  She has been faithfully attending Parkinson's therapies at the neuro-rehabilitation center.  She has not had any falls since our last visit.  No hallucinations.  No lightheadedness or near syncope.    PREVIOUS MEDICATIONS: Mirapex and azilect; levodopa for a few weeks to "prove" benefit; requip  ALLERGIES:   Allergies  Allergen Reactions  . Sporanox [Itraconazole]   . Tylenol [Acetaminophen]     CURRENT MEDICATIONS:  Current Outpatient Prescriptions on File Prior to Visit  Medication Sig Dispense Refill  . ALPRAZolam (XANAX) 0.25 MG tablet Take 0.25 mg by mouth 1 day or 1 dose.    Marland Kitchen aspirin 325 MG tablet Take 325 mg by mouth daily.    Marland Kitchen atorvastatin (LIPITOR) 10 MG tablet Take 10 mg by mouth daily.    . cholecalciferol (VITAMIN D) 1000 UNITS tablet Take 1,000 Units by mouth daily. Two daily    . clonazePAM (KLONOPIN) 1  MG tablet TAKE 1 TABLET BY MOUTH AT BEDTIME 90 tablet 1  . co-enzyme Q-10 30 MG capsule Take 30 mg by mouth daily. Taking 60mg     . CYMBALTA 60 MG capsule Take 1 capsule (60 mg total) by mouth daily. 90 capsule 3  . hydrochlorothiazide (HYDRODIURIL) 25 MG tablet Take 25 mg by mouth daily.    . Magnesium 250 MG TABS Take by mouth 2 (two) times daily.    . methocarbamol (ROBAXIN) 500 MG tablet TAKE 1 TABLET EVERY 4 HOURS AS NEEDED FOR MUSCLE SPASM  1  . mirabegron ER (MYRBETRIQ) 25 MG TB24 tablet Take 25 mg by mouth daily.    . Multiple Vitamins-Minerals (ICAPS PO) Take by mouth.    . raloxifene (EVISTA) 60 MG tablet Take 60 mg by mouth daily.    Marland Kitchen SINEMET 25-100 MG tablet Take 1 tablet by mouth 3 (three) times daily. (Patient taking differently: Take 1 tablet by mouth 4 (four) times daily. ) 270 tablet 3   No current facility-administered medications  on file prior to visit.    PAST MEDICAL HISTORY:   Past Medical History  Diagnosis Date  . Osteopenia   . Hypertension   . Parkinson disease ([place])   . Fibromyalgia   . Hypercholesteremia     controlled on medication  . Melanoma ([place])     PAST SURGICAL HISTORY:   Past Surgical History  Procedure Laterality Date  . Tonsillectomy    . Tubal ligation      SOCIAL HISTORY:   Social History   Social History  . Marital Status: Married    Spouse Name: N/A  . Number of Children: N/A  . 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 Smoking status: Former Smoker    Quit date: 09/29/1997  . Smokeless tobacco: Never Used     Comment: chews Nicorette, 10-12/day  . Alcohol Use: Yes     Comment: Occ  . Drug Use: No  . Sexual Activity: Not on file   Other Topics Concern  . Not on file   Social History Narrative    FAMILY HISTORY:   Family Status  Relation Status Death Age  . Mother Deceased     colon CA  . Father Alive     healthy  . Brother Alive      healthy  . Sister Alive     4, healthy  . Child Alive     healthy    ROS:  A complete 10 system review of systems was obtained and was unremarkable apart from what is mentioned above.  PHYSICAL EXAMINATION:    VITALS:   There were no vitals filed for this visit.  GEN:  The patient appears stated age and is in NAD. HEENT:  Normocephalic, atraumatic.  The mucous membranes are moist. The superficial temporal arteries are without ropiness or tenderness. CV: RRR Lungs:  CTAB Neck: No bruits   Neurological examination:  Orientation: The patient is alert and oriented x3. Fund of knowledge is appropriate.  Recent and remote memory are intact.  Attention and concentration are normal.    Able to name objects and repeat phrases.  Movement examination: Tone: There is moderate increased tone in the left upper and lower extremity.  There is normal tone on the right  Abnormal movements: No tremor noted today Coordination:  There is good RAM's today Gait and Station: The patient has no difficulty arising out of a deep-seated chair without the use of the hands. The patient's stride length is good today.  Arm swing is good.  She has a negative pull test.  LABS:  Lab Results  Component Value Date   TSH 0.80 01/15/2013   Lab Results  Component Value Date   [id number] 01/15/2013    SPEP/UPEP negative.    Lab Results  Component Value Date   FOLATE 22.1 01/15/2013     ASSESSMENT/PLAN:  1.  Idiopathic akinetic rigid Parkinson's disease.    -We discussed the diagnosis as well as pathophysiology of the disease.  We discussed treatment options as well as prognostic indicators.  Patient education was provided.  -Unfortunately, pt really keeps dosing herself.  She has gone up and down and now is off mirapex and titrated up on her own on levodopa to 25/100 qid.  She said that when she was on both she had dyskinesia (and may have as she did in the past).  I told her  that I really  would like her to let me dose her medications or at least call me so that I had an idea how she was taking them.    -We again talked about the importance of exercise.   2.  probable peripheral neuropathy, idiopathic.  -Back on Cymbalta both for peripheral neuropathy as well as fibromyalgia. 3.  history of melanoma.  -There is some old literature that suggests that the addition of levodopa can increase risk of return melanoma, but new literature suggests that this is not the etiology.   -She is having skin examininations 4.  history of tobacco abuse.  -While the patient has given up smoking, she is using quite a bit of Nicorette gum per day.  I talked to her about the fact that she really needs to stop this as well.  She is just not ready, stating that she has done this since 2010 and doesn't feel the need to quit. 5.  Insomnia with some RBD  -She is doing fine with clonazepam.  She is on 1 mg nightly 6.  Anxiety, situational (with driving)  -talked about CBT.  She does not want a referral right now 7.  I will see her back in follow-up in the next 3 or 4 months, sooner should new neurologic issues arise.  Much greater than 50% of the 40 minute visit in counseling once again.

## 2015-09-04 ENCOUNTER — Encounter: Payer: Self-pay | Admitting: Neurology

## 2015-09-16 DIAGNOSIS — Z23 Encounter for immunization: Secondary | ICD-10-CM | POA: Diagnosis not present

## 2015-09-16 DIAGNOSIS — L219 Seborrheic dermatitis, unspecified: Secondary | ICD-10-CM | POA: Diagnosis not present

## 2015-09-23 DIAGNOSIS — H353122 Nonexudative age-related macular degeneration, left eye, intermediate dry stage: Secondary | ICD-10-CM | POA: Diagnosis not present

## 2015-09-23 DIAGNOSIS — H353112 Nonexudative age-related macular degeneration, right eye, intermediate dry stage: Secondary | ICD-10-CM | POA: Diagnosis not present

## 2015-09-23 DIAGNOSIS — H5213 Myopia, bilateral: Secondary | ICD-10-CM | POA: Diagnosis not present

## 2015-09-23 DIAGNOSIS — H25013 Cortical age-related cataract, bilateral: Secondary | ICD-10-CM | POA: Diagnosis not present

## 2015-11-09 DIAGNOSIS — J209 Acute bronchitis, unspecified: Secondary | ICD-10-CM | POA: Diagnosis not present

## 2015-11-10 ENCOUNTER — Telehealth: Payer: Self-pay | Admitting: Neurology

## 2015-11-10 NOTE — Telephone Encounter (Signed)
Pt left message on the voice mail and would like to talk to you about her medication [phone]

## 2015-11-11 MED ORDER — PRAMIPEXOLE DIHYDROCHLORIDE ER 0.75 MG PO TB24: 1.0000 | ORAL_TABLET | Freq: Every day | ORAL | Status: DC

## 2015-11-11 NOTE — Telephone Encounter (Signed)
Okay but I would greatly appreciate if she would let me dose her meds.  Very difficult to monitor her because meds change so much

## 2015-11-11 NOTE — Telephone Encounter (Signed)
Patient has been experimenting with her medication dosages. She states what she has found to work best for her is Carbidopa Levodopa 25/100 - 1 tablet 3 times daily along with Mirapex ER 0.75 mg - once daily. She is out of the Mirapex ER tablets. Okay to send this prescription to [facility] on [address]?

## 2015-11-11 NOTE — Telephone Encounter (Signed)
Patient made aware. RX sent to pharmacy.  

## 2015-12-17 ENCOUNTER — Encounter: Payer: Self-pay | Admitting: Neurology

## 2015-12-17 ENCOUNTER — Ambulatory Visit (INDEPENDENT_AMBULATORY_CARE_PROVIDER_SITE_OTHER): Payer: Medicare Other | Admitting: Neurology

## 2015-12-17 VITALS — BP 128/62 | HR 54 | Ht 65.0 in | Wt 173.0 lb

## 2015-12-17 DIAGNOSIS — M797 Fibromyalgia: Secondary | ICD-10-CM | POA: Diagnosis not present

## 2015-12-17 DIAGNOSIS — I499 Cardiac arrhythmia, unspecified: Secondary | ICD-10-CM

## 2015-12-17 DIAGNOSIS — R001 Bradycardia, unspecified: Secondary | ICD-10-CM | POA: Diagnosis not present

## 2015-12-17 DIAGNOSIS — G609 Hereditary and idiopathic neuropathy, unspecified: Secondary | ICD-10-CM

## 2015-12-17 DIAGNOSIS — G20 Parkinson's disease: Secondary | ICD-10-CM

## 2015-12-17 DIAGNOSIS — I498 Other specified cardiac arrhythmias: Secondary | ICD-10-CM

## 2015-12-17 NOTE — Progress Notes (Signed)
Morgan [name] was seen today in the movement disorders clinic for neurologic consultation at the request of Dr. [name] at [facility] internal medicine.  She is accompanied by her husband who helps to supplement the history.  She has previously seen neurology, both Dr. Krista Blue and Dr. Doy Mince.  I reviewed some of these notes.  The consultation is for the evaluation of Parkinson's disease.  The patient is a 65 y.o. right handed female reports that her first sx was "active dreaming."  However, the symptom that got her to the dr was L greater than R hand tremor, only when moving hands/drying hair.  This was in 2010.  She was started on carbidopa/levodopa just as a "trial" for 2 weeks and then it was stopped because it worked.  She was told that it was diagnostic of PD.  She was then started on the Mirapex ER 1.5 mg in 07/2009. and it was worked up to 3.0 mg.  She reports compulsive shopping (she overspends her "allowance).  She mostly internets shops.  She has always shopped but she has previously not gone over the "allowance."  She plays the lottery and plays more than previous.  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  She tried to get off the medication but it was unsuccessful because of worsening of PD.  She was placed on Azilect.  She has been on it for about 2 years.  She has not noted any benefits.    It was her impression that it made the mirapex last longer.  She feels strongly that the computer work and shopping became an issue because of the azilect.   04/19/13 update:  The patient presents today with her husband who supplements the history.  Once she discontinued the Azilect, she felt that her compulsive shopping got better.  Her husband feels that she is not exercising enough, but the patient estimates that she perhaps exercises 4 days per week, but not to the intensity that her husband would like.  She had one near fall since our last visit, but she was on a trail in [place] with 2 walking sticks and one  of them something to the ground and her husband caught her.  She is still taking clonazepam at night.  Her husband thinks that she takes it too early and falls asleep on the couch, but she denies this.  There have not been hallucinations.  There has not been nausea or vomiting.  She has been slower.   08/13/13 update:  The pt is f/u for PD.  She is accompanied by her husband who supplements the hx.  She is on mirapex 3 mg daily and carbidopa/levodopa 25/100 was added last visit.  She notices that she has more energy and can do more things, although her husband states that she does not necessarily do those things (including exercise). She noted that the feet "wobble" since being on the medication.  She also notices that her shopping "addiction" has returned.  She has had no falls.  No hallucinations.  No near syncope.  09/24/13 update:  The pt is f/u regarding PD.  She is accompanied by her husband who helps to supplement the history.  Last visit, I decreased her pramipexole to 2.25 mg daily secondary to compulsive shopping.  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  They both  agree that this could be pathologic.  She is now on carbidopa/levodopa 25/100, half a tablet 4 times per day.  Dyskinesia in the foot is better.  She is just getting over the flu.  No hallucinations.  She has not started exercising faithfully, primarily because she has not been feeling well.  No falls.    12/24/13 update:  The patient is following up in regards to her Parkinson's disease.  She is accompanied by her husband who helps to supplement the history.  Her Mirapex has been reduced to 1.5 mg daily secondary to compulsive behaviors.  She did call and ask if she could increase it, but I did not want to do that.  She is still having compulsive behaviors (shopping) and her husband  would like her to discontinue the medication altogether.  She is currently on carbidopa/levodopa 25/100, half a tablet 4 times per day.  She takes at approximately 8 AM, noon, 4 PM and 9 PM.  Unfortunately, the patient had dyskinesia at higher dosages.  Nonetheless, she states that she would rather have the dyskinesia than feel like she currently does.  She feels slow and achy and just does not feel like doing anything.  She was not having any hallucinations.  She does state that she signed up for a 5K in January and would like to start training for that.  She is not having any hallucinations.  No falls.  She is still on Cymbalta, 60 mg daily as well as clonazepam for insomnia.  01/16/14 update:  The patient is accompanied by her husband who helps to supplement the history.  Last visit, we started the patient on Rytary, and she is currently on 145 mg 3 times a day.  Unfortunately, the patient reports that she had insomnia on the medication and felt "woozy" and really overall felt better on the carbidopa/levodopa IR and would like to go back to that medication, even though she dyskinesia on low dose.  She asks me if she can wean the Mirapex and restart the levodopa.  [facility].  She is planning on working up to running a 5K at [employer] in January, 2016.  She remains on the Cymbalta for her fibromyalgia.  03/20/14 update:  The patient returns today as a work in, accompanied by her husband who supplements the history.  Much has happened since our last visit.  Last visit, I tried to decrease the patient's Mirapex ER to 0.75 mg daily, primarily because of compulsive behaviors.  I started her on levodopa.  She called me back and wanted to go up on the Mirapex because of cramping in the thigh, but instead I increased her levodopa to 4 times a day.  The cramping did get better, but she called later with dyskinesia.  At that time my partner was on  call and she reluctantly increased the patient's Mirapex back to 1.5 mg daily and decreased patient's levodopa to twice a day dosing.  The patient later called me with continued dyskinesias and wanted to go further up on the Mirapex, but I have been resistant because of her compulsive spending in the past.  Instead, on 03/07/2014 I asked her to try amantadine and called in a prescription for this.  Unfortunately, she stated that she read the side effects and decided that she was not going to pick this up and take it.  She is now only on Mirapex 1.5 mg  daily and levodopa 25/100, one tablet in the morning and half tablet at night.  She would like to go back up on the Mirapex and her husband states that if he has to disable the computer to deal with her compulsive shopping then he will do that.  04/21/14 update:  Pt returns today for follow up, accompanied by her husband who supplements the history.  She is back on mirapex, 1.5 mg daily.  She did not do well on the requip and was pretty insistent on going back on the mirapex despite previous convulsive behaviors.  She wants to go off levodopa 25/100, and she is on it 1/2 bid but often forgets the second dose.  She is able to drive again.   She states that she thinks that she needs to go back up to the 3.0 of the mirapex.  Her husband is not now completely convinced that the mirapex caused compulsive shopping but thinks that boredom did that.  They are getting ready to go on a 14 day [employer] cruise at the end of October.   07/29/14 update:  Pt returns for f/u, accompanied by her husband who supplements the history.  She is now up to 3.0 mg of mirapex ER a day.   She states that she doesn't feel as good as she did when she was on it previously, but she is doing better than she was on the previous medications.  She has some trouble getting off of the sofa.  No falls.  Is doing circuit II class for PD as well as [employer] but this only takes up 2 days a  week and admits isn't doing CV work the remainder of the days.  Husband asks about finding a counselor for her anxiety, which is primarily associated with driving.  Her compulsive shopping has not gotten worse with higher dose of mirapex (feels that it is better as they just got off a cruise and she was previously shopping for the cruise)  11/04/14 update:  Pt returns for f/u, accompanied by her husband who supplements the history.  She did the [employer] since our last visit.  She does state that she was the last one in but she was able to finish.  I had got a call from the rehab unit that the patient had wanted to hold therapy because of fibromyalgia but the patient states that she wanted to continue therapy and perhaps she cancelled one day and not the entire therapy.  She asks me to renew the order.  Just a few days ago (2 nights) the patient backed down on the mirapex from 3.75 mg to 3.0 mg due to EDS and she wasn't sure if it was due to  the mirapex so she wanted to try the lower dosage.  She would like to get a scooter and asks me about that today  02/05/15 update:  The patient is accompanied by her husband who supplements the history.  She is on Mirapex ER 3.0 mg daily.  She thinks that she wants to try again potentially going up on it.  She feels a little slower than usual.  She is doing [employer] classes.     She denies any falls.  No hallucinations.  No lightheadedness or near syncope.  No dyskinesia.  She is supposed to be on Cymbalta for both peripheral neuropathy and fibromyalgia.  She ran out and is now not doing well.  She has been out of it for 2 weeks.  The patient thinks that her fibromyalgia got out of control, she quit exercising so vigorously and then her Parkinson's got worse.  She takes clonazepam as needed for insomnia (3-4 days per week).  They're planning several cruises and several vacations in the near future.  05/26/15 update:  The patient is following up today, accompanied by her husband who  supplements the history.  Last visit, I increased her Mirapex to 3.75 mg daily and restarted her Cymbalta for examination of fibromyalgia and peripheral neuropathy.  She thinks that the cymbalta helped but she thinks the increased mirapex caused more tremor.  She also thinks it causes EDS.  She is falling asleep now in the afternoon and having to take the klonopin late at night to help her sleep (1 mg).  She states that doesn't give her hang over effect.  She wants to go back down again to 3.0 mg of the mirapex.    09/03/15 update:  The patient is following up today, accompanied by her husband who supplements the history.  Last visit, the patient wanted to decrease her pramipexole to 3.0 mg daily.  Last visit, we restarted her Sinemet 25/100, half a tablet 3 times per day.  She states that she ended up d/c her pramipexole all together and increased her Sinemet to 1 po qid.  She feels well as long as she doesn't overdo it.  She remains on Cymbalta, 60 mg daily for peripheral neuropathy and fibromyalgia.  She is also on clonazepam 1 mg nightly for REM behavior disorder and insomnia.  She has been faithfully attending Parkinson's therapies at the neuro-rehabilitation center.  She has not had any falls since our last visit.  No hallucinations.  No lightheadedness or near syncope.  12/17/15 update:  The patient returns today, accompanied by her husband who supplements the history.  Last visit, she was on carbidopa/levodopa 25/100, 1 tablet 4 times per day and had taken herself off of pramipexole.  She has put herself back on pramipexole ER 0.75 mg daily and is on carbidopa/levodopa 25/100 3 times per day.  She denies hallucinations.  She had no falls but did trip over a stool in her closet but didn't fall.   She is doing [employer] class one day a week.  She is riding the recumbant bike.  She is scheduled for her assessment next week for PT/OT.      PREVIOUS MEDICATIONS: Mirapex and azilect; levodopa for a few  weeks to "prove" benefit; requip  ALLERGIES:   Allergies  Allergen Reactions  . Sporanox [Itraconazole]   . Tylenol [Acetaminophen]     CURRENT MEDICATIONS:  Current Outpatient Prescriptions on File Prior to Visit  Medication Sig Dispense Refill  . ALPRAZolam (XANAX) 0.25 MG tablet Take 0.25 mg by mouth 1 day or 1 dose.    Marland Kitchen aspirin 325 MG tablet Take 325 mg by mouth daily.    Marland Kitchen atorvastatin (LIPITOR) 10 MG tablet Take 10 mg by mouth daily.    . clonazePAM (KLONOPIN) 1 MG tablet TAKE 1 TABLET BY MOUTH AT BEDTIME 90 tablet 1  . co-enzyme Q-10 30 MG capsule Take 30 mg by mouth daily. Taking 60mg     . CYMBALTA 60 MG capsule Take 1 capsule (60 mg total) by mouth daily. 90 capsule 3  . hydrochlorothiazide (HYDRODIURIL) 25 MG tablet Take 25 mg by mouth daily.    . Magnesium 250 MG TABS Take by mouth 2 (two) times daily.    . mirabegron ER (MYRBETRIQ) 25 MG  TB24 tablet Take 25 mg by mouth daily.    . Multiple Vitamins-Minerals (ICAPS PO) Take by mouth.    . Pramipexole Dihydrochloride 0.75 MG TB24 Take 1 tablet (0.75 mg total) by mouth daily. 90 tablet 0  . raloxifene (EVISTA) 60 MG tablet Take 60 mg by mouth daily.    Marland Kitchen SINEMET 25-100 MG tablet Take 1 tablet by mouth 3 (three) times daily. 270 tablet 3   No current facility-administered medications on file prior to visit.    PAST MEDICAL HISTORY:   Past Medical History  Diagnosis Date  . Osteopenia   . Hypertension   . Parkinson disease ([place])   . Fibromyalgia   . Hypercholesteremia     controlled on medication  . Melanoma ([place])     PAST SURGICAL HISTORY:   Past Surgical History  Procedure Laterality Date  . Tonsillectomy    . Tubal ligation      SOCIAL HISTORY:   Social History   Social History  . Marital Status: Married    Spouse Name: N/A  . Number of Children: N/A  . 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 Smoking status: Former  Smoker    Quit date: 09/29/1997  . Smokeless tobacco: Never Used     Comment: chews Nicorette, 10-12/day  . Alcohol Use: Yes     Comment: Occ  . Drug Use: No  . Sexual Activity: Not on file   Other Topics Concern  . Not on file   Social History Narrative    FAMILY HISTORY:   Family Status  Relation Status Death Age  . Mother Deceased     colon CA  . Father Alive     healthy  . Brother Alive     healthy  . Sister Alive     4, healthy  . Child Alive     healthy    ROS:  A complete 10 system review of systems was obtained and was unremarkable apart from what is mentioned above.  PHYSICAL EXAMINATION:    VITALS:   Filed Vitals:   12/17/15 1546  BP: 128/62  Pulse: 54  Height: 5\' 5"  (1.651 m)  Weight: 173 lb (78.472 kg)    GEN:  The patient appears stated age and is in NAD. HEENT:  Normocephalic, atraumatic.  The mucous membranes are moist. The superficial temporal arteries are without ropiness or tenderness. CV:  Bigeminy with some heart rate variability (was 40's to 100) Lungs:  CTAB Neck: No bruits   Neurological examination:  Orientation: The patient is alert and oriented x3.   Movement examination: Tone: There is mild increased tone in the left upper and lower extremity.  There is normal tone on the right  Abnormal movements: No tremor noted today; mild and rare dyskinesia in the LLE Coordination:  There is good RAM's today Gait and Station: The patient has no difficulty arising out of a deep-seated chair without the use of the hands. The patient's stride length is good today.  Arm swing is good.  She has a negative pull test.  LABS:  Lab Results  Component Value Date   TSH 0.80 01/15/2013   Lab Results  Component Value Date   [id number] 01/15/2013    SPEP/UPEP negative.    Lab Results  Component Value Date   FOLATE 22.1 01/15/2013     ASSESSMENT/PLAN:  1.  Idiopathic akinetic  rigid Parkinson's disease.    -We discussed the  diagnosis as well as pathophysiology of the disease.  We discussed treatment options as well as prognostic indicators.  Patient education was provided.  -Unfortunately, pt really keeps dosing herself.  She has titrated up on her own on levodopa to 25/100 tid.  She is on mirapex er 0.75 mg daily  -We again talked about the importance of exercise.   2.  Bigeminy with HR variability  -Long discussion with pt and husband.  Pt asymptomatic.  Told them that this is far out of my area of expertise.  EKG today.  Called PCP for appt tomorrow and they said that they couldn't get her in but pt could call first thing and ask for appt.  If any sx's, go to ER 3.  probable peripheral neuropathy, idiopathic.  -Back on Cymbalta both for peripheral neuropathy as well as fibromyalgia. 4.  history of melanoma.  -There is some old literature that suggests that the addition of levodopa can increase risk of return melanoma, but new literature suggests that this is not the etiology.   -She is having skin examininations 5.  history of tobacco abuse.  -While the patient has given up smoking, she is using quite a bit of Nicorette gum per day.  I talked to her about the fact that she really needs to stop this as well.  She is just not ready, stating that she has done this since 2010 and doesn't feel the need to quit. 6.  Insomnia with some RBD  -She is doing fine with clonazepam.  She is on 1 mg nightly 7.  I will see her back in follow-up in the next 3 or 4 months, sooner should new neurologic issues arise.  Much greater than 50% of the 35 minute visit in counseling once again.

## 2015-12-17 NOTE — Patient Instructions (Signed)
Call Dr Thompson Caul office at 8:30 am - [phone] for an appt.

## 2015-12-18 ENCOUNTER — Encounter: Payer: Self-pay | Admitting: Neurology

## 2015-12-21 ENCOUNTER — Emergency Department (HOSPITAL_COMMUNITY): Payer: Medicare Other

## 2015-12-21 ENCOUNTER — Encounter (HOSPITAL_COMMUNITY): Payer: Self-pay | Admitting: Emergency Medicine

## 2015-12-21 ENCOUNTER — Emergency Department (HOSPITAL_COMMUNITY)
Admission: EM | Admit: 2015-12-21 | Discharge: 2015-12-22 | Disposition: A | Discharge status: Home / Self Care | Payer: Medicare Other | Attending: Emergency Medicine | Admitting: Emergency Medicine

## 2015-12-21 ENCOUNTER — Other Ambulatory Visit: Payer: Self-pay | Admitting: Neurology

## 2015-12-21 DIAGNOSIS — I10 Essential (primary) hypertension: Secondary | ICD-10-CM | POA: Insufficient documentation

## 2015-12-21 DIAGNOSIS — Z87891 Personal history of nicotine dependence: Secondary | ICD-10-CM | POA: Diagnosis not present

## 2015-12-21 DIAGNOSIS — R404 Transient alteration of awareness: Secondary | ICD-10-CM | POA: Diagnosis not present

## 2015-12-21 DIAGNOSIS — I493 Ventricular premature depolarization: Secondary | ICD-10-CM

## 2015-12-21 DIAGNOSIS — I499 Cardiac arrhythmia, unspecified: Secondary | ICD-10-CM | POA: Diagnosis not present

## 2015-12-21 DIAGNOSIS — Z79899 Other long term (current) drug therapy: Secondary | ICD-10-CM | POA: Insufficient documentation

## 2015-12-21 DIAGNOSIS — R000 Tachycardia, unspecified: Secondary | ICD-10-CM | POA: Diagnosis not present

## 2015-12-21 DIAGNOSIS — Z7982 Long term (current) use of aspirin: Secondary | ICD-10-CM | POA: Insufficient documentation

## 2015-12-21 DIAGNOSIS — M797 Fibromyalgia: Secondary | ICD-10-CM | POA: Insufficient documentation

## 2015-12-21 DIAGNOSIS — G20 Parkinson's disease: Secondary | ICD-10-CM | POA: Diagnosis not present

## 2015-12-21 DIAGNOSIS — I459 Conduction disorder, unspecified: Secondary | ICD-10-CM | POA: Diagnosis not present

## 2015-12-21 DIAGNOSIS — R531 Weakness: Secondary | ICD-10-CM | POA: Diagnosis not present

## 2015-12-21 DIAGNOSIS — Z85820 Personal history of malignant melanoma of skin: Secondary | ICD-10-CM | POA: Diagnosis not present

## 2015-12-21 DIAGNOSIS — I454 Nonspecific intraventricular block: Secondary | ICD-10-CM | POA: Diagnosis not present

## 2015-12-21 DIAGNOSIS — R9431 Abnormal electrocardiogram [ECG] [EKG]: Secondary | ICD-10-CM | POA: Diagnosis not present

## 2015-12-21 LAB — CBC
HEMATOCRIT: 41 % (ref 36.0–46.0)
Hemoglobin: 13.1 g/dL (ref 12.0–15.0)
MCH: 28.1 pg (ref 26.0–34.0)
MCHC: 32 g/dL (ref 30.0–36.0)
MCV: 88 fL (ref 78.0–100.0)
PLATELETS: 271 10*3/uL (ref 150–400)
RBC: 4.66 MIL/uL (ref 3.87–5.11)
RDW: 13.9 % (ref 11.5–15.5)
WBC: 9.2 10*3/uL (ref 4.0–10.5)

## 2015-12-21 LAB — BASIC METABOLIC PANEL
Anion gap: 11 (ref 5–15)
BUN: 9 mg/dL (ref 6–20)
CHLORIDE: 100 mmol/L — AB (ref 101–111)
CO2: 29 mmol/L (ref 22–32)
CREATININE: 0.8 mg/dL (ref 0.44–1.00)
Calcium: 9.5 mg/dL (ref 8.9–10.3)
GFR calc Af Amer: >60 mL/min (ref >60)
GFR calc non Af Amer: >60 mL/min (ref >60)
GLUCOSE: 99 mg/dL (ref 65–99)
POTASSIUM: 3.1 mmol/L — AB (ref 3.5–5.1)
Sodium: 140 mmol/L (ref 135–145)

## 2015-12-21 LAB — I-STAT TROPONIN, ED: Troponin i, poc: 0.02 ng/mL (ref 0.00–0.08)

## 2015-12-21 NOTE — ED Notes (Addendum)
Pt sent from [facility] via EMS. Pt was instructed to seek treatment ref an irregular heart rate. Pt denies chest pain, SOB. Pt c/o fatigue, and gen weakness but states she is unsure if it is related to her parkinsons meds or something else. Pt states the dr gave her 1 NTG and she felt no change.

## 2015-12-21 NOTE — Telephone Encounter (Signed)
Cymbalta refill requested. Per last office note- patient to remain on medication. Refill approved and sent to patient's pharmacy.

## 2015-12-22 ENCOUNTER — Ambulatory Visit: Payer: Federal, State, Local not specified - PPO

## 2015-12-22 ENCOUNTER — Ambulatory Visit: Payer: Medicare Other | Admitting: Occupational Therapy

## 2015-12-22 ENCOUNTER — Ambulatory Visit: Payer: Medicare Other | Admitting: Physical Therapy

## 2015-12-22 DIAGNOSIS — I493 Ventricular premature depolarization: Secondary | ICD-10-CM | POA: Diagnosis not present

## 2015-12-22 LAB — MAGNESIUM: Magnesium: 2.3 mg/dL (ref 1.7–2.4)

## 2015-12-22 MED ORDER — POTASSIUM CHLORIDE CRYS ER 20 MEQ PO TBCR
40.0000 meq | EXTENDED_RELEASE_TABLET | Freq: Once | ORAL | Status: AC
  Administered 2015-12-22: 40 meq via ORAL
  Filled 2015-12-22: qty 2

## 2015-12-22 NOTE — ED Provider Notes (Signed)
CSN: [id number]     Arrival date & time 12/21/15  1544 History  By signing my name below, I, Evelene Croon, attest that this documentation has been prepared under the direction and in the presence of Varney Biles, MD . Electronically Signed: Evelene Croon, Scribe. 12/22/2015. 12:31 AM.    Chief Complaint  Patient presents with  . irregular heart rate    The history is provided by the patient and medical records. No language interpreter was used.     HPI Comments:  Morgan [name] is a 65 y.o. female with a history of parkinson's disease, who presents to the Emergency Department to be evaluated after having abnormal EKG in PCPs office today. EKG from 12/21/15 at 1400 shows LBBB and PVCs. Today she saw PCP today who also did an EKG and was concerned. She denies SOB, dizziness, palpitations, and CP. Pt has no acute symptoms or phycial complaints at this time. She denies cardiac hx; no h/o MI or CAD.   PCP - [name]   Past Medical History  Diagnosis Date  . Osteopenia   . Hypertension   . Parkinson disease ([place])   . Fibromyalgia   . Hypercholesteremia     controlled on medication  . Melanoma [facility])    Past Surgical History  Procedure Laterality Date  . Tonsillectomy    . Tubal ligation     Family History  Problem Relation Age of Onset  . Cancer Mother     colon  . Cancer Maternal Grandmother     colon   Social History  Substance Use Topics  . Smoking status: Former Smoker    Quit date: 09/29/1997  . Smokeless tobacco: Never Used     Comment: chews Nicorette, 10-12/day  . Alcohol Use: Yes     Comment: Occ   OB History    No data available     Review of Systems  10 systems reviewed and all are negative for acute change except as noted in the HPI.   Allergies  Other; Sporanox; and Tylenol  Home Medications   Prior to Admission medications   Medication Sig Start Date End Date Taking? Authorizing Provider  ALPRAZolam Duanne Moron) 0.25 MG tablet Take 0.25 mg by  mouth daily as needed for anxiety.    Yes Historical Provider, MD  aspirin 325 MG tablet Take 325 mg by mouth daily.   Yes Historical Provider, MD  atorvastatin (LIPITOR) 10 MG tablet Take 10 mg by mouth daily.   Yes Historical Provider, MD  Calcium-Magnesium-Vitamin D (CALCIUM 500 PO) Take 500 mg by mouth 2 (two) times daily.   Yes Historical Provider, MD  cholecalciferol (VITAMIN D) 1000 units tablet Take 2,000 Units by mouth daily.   Yes Historical Provider, MD  clonazePAM (KLONOPIN) 1 MG tablet TAKE 1 TABLET BY MOUTH AT BEDTIME 07/13/15  Yes [name] [name], DO  Coenzyme Q10 (VITALINE COQ10) 60 MG TABS Take 60 mg by mouth daily.   Yes Historical Provider, MD  CYMBALTA 60 MG capsule TAKE 1 CAPSULE DAILY 12/21/15  Yes [name] [name], DO  hydrochlorothiazide (HYDRODIURIL) 25 MG tablet Take 25 mg by mouth daily.   Yes Historical Provider, MD  Magnesium 250 MG TABS Take by mouth 2 (two) times daily.   Yes Historical Provider, MD  mirabegron ER (MYRBETRIQ) 50 MG TB24 tablet Take 50 mg by mouth daily.   Yes Historical Provider, MD  Multiple Vitamins-Minerals (ICAPS PO) Take 1 capsule by mouth daily.    Yes Historical Provider, MD  raloxifene (EVISTA) 60 MG tablet Take 60 mg by mouth daily.   Yes Historical Provider, MD  SINEMET 25-100 MG tablet Take 1 tablet by mouth 3 (three) times daily. 06/24/15  Yes [name] [name], DO  Pramipexole Dihydrochloride 0.75 MG TB24 Take 1 tablet (0.75 mg total) by mouth daily. Patient not taking: Reported on 12/22/2015 11/11/15   Wells Guiles S [name], DO   BP 131/79 mmHg  Pulse 82  Temp(Src) 98.3 F (36.8 C) (Oral)  Resp 14  Ht 5\' 3"  (1.6 m)  Wt 173 lb (78.472 kg)  BMI 30.65 kg/m2  SpO2 89% Physical Exam  Constitutional: She is oriented to person, place, and time. She appears well-developed and well-nourished. No distress.  HENT:  Head: Normocephalic and atraumatic.  Eyes: Conjunctivae are normal.  Cardiovascular: Normal rate and normal heart sounds.   No murmur  heard. Irregular pulse   Pulmonary/Chest: Breath sounds normal. No respiratory distress.  Abdominal: Soft. She exhibits no distension.  Musculoskeletal: She exhibits no edema.  Neurological: She is alert and oriented to person, place, and time.  Skin: Skin is warm and dry.  Psychiatric: She has a normal mood and affect.  Nursing note and vitals reviewed.   ED Course  Procedures   DIAGNOSTIC STUDIES:  Oxygen Saturation is 100% on RA, normal by my interpretation.    COORDINATION OF CARE:  12:22 AM Discussed treatment plan with pt at bedside and pt agreed to plan.  Labs Review Labs Reviewed  BASIC METABOLIC PANEL - Abnormal; Notable for the following:    Potassium 3.1 (*)    Chloride 100 (*)    All other components within normal limits  CBC  MAGNESIUM  I-STAT TROPOININ, ED    Imaging Review No results found. I have personally reviewed and evaluated these images and lab results as part of my medical decision-making.   EKG Interpretation   Date/Time:  Monday December 21 2015 15:56:34 EDT Ventricular Rate:  96 PR Interval:  168 QRS Duration: 120 QT Interval:  442 QTC Calculation: 558 R Axis:   -152 Text Interpretation:  Normal sinus rhythm Low voltage QRS Inferior infarct  , age undetermined Anterolateral infarct , age undetermined Abnormal ECG  Non-specific intra-ventricular conduction delay new changes -  intraventricular conduction delay Confirmed by Kathrynn Humble, MD, Thelma Comp [phone])  on 12/22/2015 12:04:30 AM    1:11 AM Discussed case with Dr. Philbert Riser (Cardiology), he reviewed the EKG and informs [place] that the outpatient EKG does have p waves that are not conducted, but it's not due to 2nd degree type II, but due to PVCs. Recommends outpatient f/u. Family informed.  Strict ER return precautions have been discussed, and patient is agreeing with the plan and is comfortable with the workup done and the recommendations from the ER.   MDM   Final diagnoses:  PVC (premature  ventricular contraction)  Intraventricular conduction delay    I personally performed the services described in this documentation, which was scribed in my presence. The recorded information has been reviewed and is accurate.  PT comes in with cc of palpitations. Pt asked to come to the ER by the pcp for abnormal ekg. PT appears to be having PVCs. Old ekg from clinic - ? AV block - and so we did have Cards review the ekgs and they clear the patient. He has conduction delays, but w/o chest pain, dib - no need for acute ischemic workup. PT advised to f/u with outpatient Cards, and comfortable with plan.   Morgan Allerton,  MD 12/24/15 [phone]

## 2015-12-22 NOTE — Discharge Instructions (Signed)
You have an abnormal EKG - but no concerns that should lead to admission. You need to be seen by Cardiologist as an outpatient- call the number provided.  Please return to the ER if you have worsening chest pain, shortness of breath, pain radiating to your jaw, shoulder, or back, sweats or fainting. Otherwise see the Cardiologist or your primary care doctor as requested.    Premature Ventricular Contraction A premature ventricular contraction is an irregularity in the normal heart rhythm. These contractions are extra heartbeats that occur too early in the normal sequence. In most cases, these contractions are harmless and do not require treatment. CAUSES Premature ventricular contractions may occur without a known cause. In healthy people, the extra contractions may be caused by:  Smoking.  Drinking alcohol.  Caffeine.  Certain medicines.  Some illegal drugs.  Stress. Sometimes, changes in chemicals in the blood (electrolytes) can also cause premature ventricular contractions. They can also occur in people with heart diseases that cause a decrease in blood flow to the heart. SIGNS AND SYMPTOMS Premature ventricular contractions often do not cause any symptoms. In some cases, you may have a feeling of your heart beating fast or skipping a beat (palpitations). DIAGNOSIS Your health care provider will take your medical history and do a physical exam. During the exam, the health care provider will check for irregular heartbeats. Various tests may be done to help diagnose premature ventricular contractions. These tests may include:  An ECG (electrocardiogram) to monitor the electrical activity of your heart.  Holter monitor testing. A Holter monitor is a portable device that can monitor the electrical activity of your heart over longer periods of time.  Stress tests to see how exercise affects your heart rhythm.  Echocardiogram. This test uses sound waves (ultrasound) to produce an  image of your heart.  Electrophysiology study. This is used to evaluate the electrical conduction system of your heart. TREATMENT Usually, no treatment is needed. You may be advised to avoid things that can trigger the premature contractions, such as caffeine or alcohol. Medicines are sometimes given if symptoms are severe or if the extra heartbeats are very frequent. Treatment may also be needed for an underlying cause of the contractions if one is found. HOME CARE INSTRUCTIONS  Take medicines only as directed by your health care provider.  Make any lifestyle changes recommended by your health care provider. These may include:  Quitting smoking.  Avoiding or limiting caffeine or alcohol.  Exercising. Talk to your health care provider about what type of exercise is safe for you.  Trying to reduce stress.  Keep all follow-up visits with your health care provider. This is important. SEEK IMMEDIATE MEDICAL CARE IF:  You feel palpitations that are frequent or continual.  You have chest pain.  You have shortness of breath.  You have sweating for no reason.  You have nausea and vomiting.  You become light-headed or faint.   This information is not intended to replace advice given to you by your health care provider. Make sure you discuss any questions you have with your health care provider.   Document Released: 04/01/2004 Document Revised: 09/05/2014 Document Reviewed: 01/16/2014 Elsevier Interactive Patient Education [employer].

## 2015-12-24 ENCOUNTER — Encounter: Payer: Self-pay | Admitting: Cardiovascular Disease

## 2015-12-24 ENCOUNTER — Ambulatory Visit (INDEPENDENT_AMBULATORY_CARE_PROVIDER_SITE_OTHER): Payer: Medicare Other | Admitting: Cardiovascular Disease

## 2015-12-24 VITALS — BP 108/82 | HR 100 | Ht 65.0 in | Wt 173.6 lb

## 2015-12-24 DIAGNOSIS — E785 Hyperlipidemia, unspecified: Secondary | ICD-10-CM

## 2015-12-24 DIAGNOSIS — I341 Nonrheumatic mitral (valve) prolapse: Secondary | ICD-10-CM

## 2015-12-24 DIAGNOSIS — I447 Left bundle-branch block, unspecified: Secondary | ICD-10-CM

## 2015-12-24 DIAGNOSIS — I493 Ventricular premature depolarization: Secondary | ICD-10-CM

## 2015-12-24 NOTE — Progress Notes (Signed)
Patient ID: Morgan [name], female   DOB: Jan 25, 1951, 65 y.o.   MRN: [id number]    Cardiology Office Note    Date:  12/25/2015   ID:  Morgan [name], DOB May 01, 1951, MRN [id number]  PCP:  Reginia Naas, MD  Cardiologist:   Sanda Klein, MD   Chief Complaint  Patient presents with  . New Evaluation    ED follow up  PCP referral for PVCs; complains of chest wall spasms (not new), lightheadedness yesterday w/position changes    History of Present Illness:  Morgan [name] is a 65 y.o. female with recently diagnosed left bundle branch block and irregular heart rhythm referred for cardiac evaluation.   Rhythm irregularity was initially detected in the neurology office raising concern for atrial fibrillation. An electrocardiogram was then performed in her primary care physician's office. The irregular rhythm was secondary to frequent PVCs. Her electrocardiogram showed left bundle branch block with accompanying secondary ST elevation in the septal leads which raised concern, especially since she has some anterior chest pain. (The chest pain description is convincingly musculoskeletal). She had a brief evaluation in the emergency room with a benign workup. There was some concern about AV conduction abnormalities, but the variations in rhythm are due to PVCs, not AV block.  Many years ago she was told that she has mitral valve prolapse based on echocardiography. That study is not available for review.  She does not have a history of structural heart disease but does have mild hypertension and hyperlipidemia both adequately treated with pharmacological means. Her most serious medical problem is Parkinson's disease she limits her mobility. Despite this, she tries to remain quite active and has even been on recent trips to [facility]. She has never smoked and does not have a family history of coronary disease. She does not have diabetes mellitus. She uses a recumbent bike 5 days a week for at  least the 30 minutes and also goes to special Parkinson's exercises. Her chest discomfort is not brought on or worsened by physical exercise. She has occasional lightheadedness if she bends over and stands up too fast.   Past Medical History  Diagnosis Date  . Osteopenia   . Hypertension   . Parkinson disease ([place])   . Fibromyalgia   . Hypercholesteremia     controlled on medication  . Melanoma [facility])     Past Surgical History  Procedure Laterality Date  . Tonsillectomy    . Tubal ligation      Current Medications: Outpatient Prescriptions Prior to Visit  Medication Sig Dispense Refill  . ALPRAZolam (XANAX) 0.25 MG tablet Take 0.25 mg by mouth daily as needed for anxiety.     Marland Kitchen aspirin 325 MG tablet Take 325 mg by mouth daily.    Marland Kitchen atorvastatin (LIPITOR) 10 MG tablet Take 10 mg by mouth daily.    . Calcium-Magnesium-Vitamin D (CALCIUM 500 PO) Take 500 mg by mouth 2 (two) times daily.    . cholecalciferol (VITAMIN D) 1000 units tablet Take 2,000 Units by mouth daily.    . clonazePAM (KLONOPIN) 1 MG tablet TAKE 1 TABLET BY MOUTH AT BEDTIME 90 tablet 1  . Coenzyme Q10 (VITALINE COQ10) 60 MG TABS Take 60 mg by mouth daily.    . CYMBALTA 60 MG capsule TAKE 1 CAPSULE DAILY 90 capsule 3  . hydrochlorothiazide (HYDRODIURIL) 25 MG tablet Take 25 mg by mouth daily.    . Magnesium 250 MG TABS Take by mouth 2 (two) times daily.    . mirabegron ER (  MYRBETRIQ) 50 MG TB24 tablet Take 50 mg by mouth daily.    . Multiple Vitamins-Minerals (ICAPS PO) Take 1 capsule by mouth daily.     . Pramipexole Dihydrochloride 0.75 MG TB24 Take 1 tablet (0.75 mg total) by mouth daily. 90 tablet 0  . raloxifene (EVISTA) 60 MG tablet Take 60 mg by mouth daily.    Marland Kitchen SINEMET 25-100 MG tablet Take 1 tablet by mouth 3 (three) times daily. 270 tablet 3   No facility-administered medications prior to visit.     Allergies:   Other; Sporanox; and Tylenol   Social History   Social History  . Marital Status:  Married    Spouse Name: N/A  . Number of Children: N/A  . 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 Smoking status: Former Smoker    Quit date: 09/29/1997  . Smokeless tobacco: Never Used     Comment: chews Nicorette, 10-12/day  . Alcohol Use: Yes     Comment: Occ  . Drug Use: No  . Sexual Activity: Not Asked   Other Topics Concern  . None   Social History Narrative     Family History:  The patient's family history includes Cancer in her maternal grandmother and mother.   ROS:   Please see the history of present illness.    ROS All other systems reviewed and are negative.   PHYSICAL EXAM:   VS:  BP 108/82 mmHg  Pulse 100  Ht 5\' 5"  (1.651 m)  Wt 78.744 kg (173 lb 9.6 oz)  BMI 28.89 kg/m2   GEN: Well nourished, well developed, in no acute distress HEENT: normal Neck: no JVD, carotid bruits, or masses Cardiac: Baseline RRR with frequent ectopy, paradoxically split S2; no murmurs, rubs, or gallops,no edema  Respiratory:  clear to auscultation bilaterally, normal work of breathing GI: soft, nontender, nondistended, + BS MS: no deformity or atrophy Skin: warm and dry, no rash Neuro:  Alert and Oriented x 3, Strength and sensation are intact Psych: euthymic mood, full affect  Wt Readings from Last 3 Encounters:  12/24/15 78.744 kg (173 lb 9.6 oz)  12/21/15 78.472 kg (173 lb)  12/17/15 78.472 kg (173 lb)      Studies/Labs Reviewed:   EKG:  EKG is ordered today.  The ekg ordered today demonstrates Sinus rhythm with frequent PVCs (3 premature beats on a 10 second strip), left bundle branch block with left axis deviation. The QRS measures 166 ms. The QTC is 531 ms. CG today is very similar to those performed in her primary care physician's office and the one performed in the emergency room. An older electrocardiogram from November 2015 shows a narrow complex QRS measuring 100 ms and the QTC  was 438 ms.  Recent Labs: 12/21/2015: BUN 9; Creatinine, Ser 0.80; Hemoglobin 13.1; Magnesium 2.3; Platelets 271; Potassium 3.1*; Sodium 140   Lipid Panel    Component Value Date/Time   CHOL  09/04/2008 0105    155        ATP III CLASSIFICATION:  <200     mg/dL   Desirable  200-239  mg/dL   Borderline High  >=240    mg/dL   High          TRIG 175* 09/04/2008 0105   HDL 40 09/04/2008 0105   CHOLHDL 3.9 09/04/2008 0105   VLDL 35 09/04/2008 0105   [place]  09/04/2008  0105    80        Total Cholesterol/HDL:CHD Risk Coronary Heart Disease Risk Table                     Men   Women  1/2 Average Risk   3.4   3.3  Average Risk       5.0   4.4  2 X Average Risk   9.6   7.1  3 X Average Risk  23.4   11.0        Use the calculated Patient Ratio above and the CHD Risk Table to determine the patient's CHD Risk.        ATP III CLASSIFICATION (LDL):  <100     mg/dL   Optimal  100-129  mg/dL   Near or Above                    Optimal  130-159  mg/dL   Borderline  160-189  mg/dL   High  >190     mg/dL   Very High    Additional studies/ records that were reviewed today include:  Records from primary care provider and the emergency department    ASSESSMENT:    1. LBBB (left bundle branch block)   2. PVCs (premature ventricular contractions)   3. Mitral valve prolapse      PLAN:  In order of problems listed above:  1. LBBB: In the absence of structural heart disease this is likely to be a benign abnormality. Her physical exam does not suggest cardiomegaly or congestive heart failure or serious valvular problems. Recommend an echocardiogram. She is essentially asymptomatic. Unless major structural abnormalities are identified, I don't think further cardiac workup will be necessary. Conduction abnormality may simply be age-related. If left ventricular systolic function is depressed or other major abnormalities are identified, further evaluation for coronary insufficiency may be in  order. 2. PVCs: Similarly, these are likely to be benign unless she has significant structural heart disease. It is possible that the increased PVC frequency is related to hypokalemia, in turn related to treatment with hydrochlorothiazide. Blood pressure control is excellent and I would recommend that she decrease the dose of hydrochlorothiazide to 12.5 mg daily. 3. MVP: Neither a systolic click nor a systolic murmur are heard on physical exam. Reevaluate this diagnosis by echo 4. HLP: Satisfactory lipid profile on current medications    Medication Adjustments/Labs and Tests Ordered: Current medicines are reviewed at length with the patient today.  Concerns regarding medicines are outlined above.  Medication changes, Labs and Tests ordered today are listed in the Patient Instructions below. Patient Instructions  Your physician has requested that you have an echocardiogram @ [address] - 3rd floor. Echocardiography is a painless test that uses sound waves to create images of your heart. It provides your doctor with information about the size and shape of your heart and how well your heart's chambers and valves are working. This procedure takes approximately one hour. There are no restrictions for this procedure.  Your physician wants you to follow-up in: 1 year with Dr. Sallyanne Kuster. You will receive a reminder letter in the mail two months in advance. If you don't receive a letter, please call our office to schedule the follow-up appointment.     Mikael Spray, MD  12/25/2015 3:30 PM    [address], [place], [place]  [address] Phone: [phone]; Fax: [phone]

## 2015-12-24 NOTE — Patient Instructions (Signed)
Your physician has requested that you have an echocardiogram @ [address] - 3rd floor. Echocardiography is a painless test that uses sound waves to create images of your heart. It provides your doctor with information about the size and shape of your heart and how well your heart's chambers and valves are working. This procedure takes approximately one hour. There are no restrictions for this procedure.  Your physician wants you to follow-up in: 1 year with Dr. Sallyanne Kuster. You will receive a reminder letter in the mail two months in advance. If you don't receive a letter, please call our office to schedule the follow-up appointment.

## 2015-12-25 DIAGNOSIS — I447 Left bundle-branch block, unspecified: Secondary | ICD-10-CM | POA: Insufficient documentation

## 2015-12-25 DIAGNOSIS — E785 Hyperlipidemia, unspecified: Secondary | ICD-10-CM | POA: Insufficient documentation

## 2015-12-25 DIAGNOSIS — I493 Ventricular premature depolarization: Secondary | ICD-10-CM | POA: Insufficient documentation

## 2015-12-29 ENCOUNTER — Telehealth: Payer: Self-pay

## 2015-12-29 NOTE — Telephone Encounter (Signed)
Message  Received: 4 days ago    Sanda Klein, MD  Dionne Bucy Truitt, CMA            I had asked her to decrease the dose of hydrochlorothiazide in half. I don't think I told you to put it on the AVS. Could you please call her and make sure that she reduces hydrochlorothiazide to only 12.5 mg daily? Also remind her to eat a dietrich in potassium. Her potassium level at the time of ER visit was only 3.1.     Called patient. Patient states she is taking a whole tablet of hydrochlorothiazide every other day. Went over foods that were high in potassium. Avocados, bananas, sweet potatoes, etc. "Those are all foods we eat anyways."

## 2016-01-07 ENCOUNTER — Ambulatory Visit: Payer: Medicare Other | Admitting: Occupational Therapy

## 2016-01-07 ENCOUNTER — Ambulatory Visit: Payer: Medicare Other | Attending: Family Medicine | Admitting: Physical Therapy

## 2016-01-07 DIAGNOSIS — R2689 Other abnormalities of gait and mobility: Secondary | ICD-10-CM | POA: Insufficient documentation

## 2016-01-07 DIAGNOSIS — R258 Other abnormal involuntary movements: Secondary | ICD-10-CM | POA: Insufficient documentation

## 2016-01-07 NOTE — Therapy (Signed)
[facility] [address] [place], [place], [address] Phone: [phone]   Fax:  [phone]  Patient Details  Name: Morgan [name] MRN: [id number] Date of Birth: 28-Jun-1951 Referring Provider:  [name], MD  Encounter Date: 01/07/2016  Physical Therapy Parkinson's Disease Screen   Timed Up and Go test:8.81 sec  10 meter walk test:  8.39 sec (3.91 ft/sec)  5 time sit to stand test:8.14 sec    Patient does not require Physical Therapy services at this time.  Recommend Physical Therapy screen in   6-9 months.     MARRIOTT,AMY W. 01/07/2016, 8:34 AM Frazier Butt., PT [facility] [address] [place], [place], [address] Phone: [phone]   Fax:  [phone]

## 2016-01-07 NOTE — Therapy (Signed)
[facility] [address] [place], [place], [address] Phone: [phone]   Fax:  [phone]  Patient Details  Name: Morgan [name] MRN: [id number] Date of Birth: Nov 10, 1950 Referring Provider:  [name], MD  Encounter Date: 01/07/2016  Occupational Therapy Parkinson's Disease Screen  Physical Performance Test item #2 (simulated eating):  9.62 sec  Physical Performance Test item #4 (donning/doffing jacket):  9.40 sec  9-hole peg test:    RUE  22.44 sec        LUE  22.65 sec  Change in ability to perform ADLs/IADLs: no      Pt does not require occupation therapy services at this time.  Recommended occupational therapy screen in   6 mons  RINE,KATHRYN 01/07/2016, 8:20 AM  [facility] [address] [place], [place], [address] Phone: [phone]   Fax:  [phone]

## 2016-01-11 ENCOUNTER — Other Ambulatory Visit: Payer: Self-pay

## 2016-01-11 ENCOUNTER — Ambulatory Visit (HOSPITAL_COMMUNITY): Payer: Medicare Other | Attending: Cardiovascular Disease

## 2016-01-11 DIAGNOSIS — I071 Rheumatic tricuspid insufficiency: Secondary | ICD-10-CM | POA: Diagnosis not present

## 2016-01-11 DIAGNOSIS — I517 Cardiomegaly: Secondary | ICD-10-CM | POA: Insufficient documentation

## 2016-01-11 DIAGNOSIS — I340 Nonrheumatic mitral (valve) insufficiency: Secondary | ICD-10-CM | POA: Insufficient documentation

## 2016-01-11 DIAGNOSIS — E785 Hyperlipidemia, unspecified: Secondary | ICD-10-CM | POA: Diagnosis not present

## 2016-01-11 DIAGNOSIS — I447 Left bundle-branch block, unspecified: Secondary | ICD-10-CM | POA: Diagnosis not present

## 2016-01-11 DIAGNOSIS — I493 Ventricular premature depolarization: Secondary | ICD-10-CM | POA: Insufficient documentation

## 2016-01-11 DIAGNOSIS — Z87891 Personal history of nicotine dependence: Secondary | ICD-10-CM | POA: Diagnosis not present

## 2016-01-11 DIAGNOSIS — I059 Rheumatic mitral valve disease, unspecified: Secondary | ICD-10-CM | POA: Diagnosis present

## 2016-01-11 DIAGNOSIS — I341 Nonrheumatic mitral (valve) prolapse: Secondary | ICD-10-CM | POA: Insufficient documentation

## 2016-01-22 ENCOUNTER — Telehealth: Payer: Self-pay

## 2016-01-22 NOTE — Telephone Encounter (Signed)
Error

## 2016-01-26 ENCOUNTER — Other Ambulatory Visit: Payer: Self-pay | Admitting: Neurology

## 2016-01-26 NOTE — Telephone Encounter (Signed)
Mirapex refill requested. Per last office note- patient to remain on medication. Refill approved and sent to patient's pharmacy.   

## 2016-02-04 DIAGNOSIS — M6281 Muscle weakness (generalized): Secondary | ICD-10-CM | POA: Diagnosis not present

## 2016-02-04 DIAGNOSIS — I10 Essential (primary) hypertension: Secondary | ICD-10-CM | POA: Diagnosis not present

## 2016-02-04 DIAGNOSIS — E785 Hyperlipidemia, unspecified: Secondary | ICD-10-CM | POA: Diagnosis not present

## 2016-02-04 DIAGNOSIS — N3281 Overactive bladder: Secondary | ICD-10-CM | POA: Diagnosis not present

## 2016-02-04 DIAGNOSIS — G20 Parkinson's disease: Secondary | ICD-10-CM | POA: Diagnosis not present

## 2016-02-04 DIAGNOSIS — M797 Fibromyalgia: Secondary | ICD-10-CM | POA: Diagnosis not present

## 2016-02-09 ENCOUNTER — Other Ambulatory Visit: Payer: Self-pay | Admitting: Neurology

## 2016-02-09 NOTE — Telephone Encounter (Signed)
Clonazepam refill requested. Per last office note- patient to remain on medication. Refill approved and sent to patient's pharmacy.   

## 2016-02-26 ENCOUNTER — Ambulatory Visit: Payer: Federal, State, Local not specified - PPO | Admitting: Neurology

## 2016-03-10 ENCOUNTER — Ambulatory Visit (INDEPENDENT_AMBULATORY_CARE_PROVIDER_SITE_OTHER): Payer: Medicare Other | Admitting: Neurology

## 2016-03-10 ENCOUNTER — Ambulatory Visit: Payer: Federal, State, Local not specified - PPO | Admitting: Occupational Therapy

## 2016-03-10 ENCOUNTER — Encounter: Payer: Self-pay | Admitting: Neurology

## 2016-03-10 VITALS — BP 138/84 | HR 100 | Ht 65.0 in | Wt 178.0 lb

## 2016-03-10 DIAGNOSIS — G609 Hereditary and idiopathic neuropathy, unspecified: Secondary | ICD-10-CM | POA: Diagnosis not present

## 2016-03-10 DIAGNOSIS — G249 Dystonia, unspecified: Secondary | ICD-10-CM

## 2016-03-10 DIAGNOSIS — M797 Fibromyalgia: Secondary | ICD-10-CM | POA: Diagnosis not present

## 2016-03-10 DIAGNOSIS — G20 Parkinson's disease: Secondary | ICD-10-CM | POA: Diagnosis not present

## 2016-03-10 MED ORDER — SINEMET 25-100 MG PO TABS: 1.0000 | ORAL_TABLET | Freq: Three times a day (TID) | ORAL | Status: DC

## 2016-03-10 MED ORDER — PRAMIPEXOLE DIHYDROCHLORIDE ER 0.75 MG PO TB24: 1.0000 | ORAL_TABLET | Freq: Every day | ORAL | Status: DC

## 2016-03-10 MED ORDER — GABAPENTIN 100 MG PO CAPS: 100.0000 mg | ORAL_CAPSULE | Freq: Every day | ORAL | Status: DC

## 2016-03-10 NOTE — Patient Instructions (Signed)
1. Continue current doses of Carbidopa Levodopa and Mirapex. 2. Add Gabapentin 100 mg - 1 tablet at bedtime. We will talk in two weeks to see how you are doing.

## 2016-03-10 NOTE — Progress Notes (Signed)
Morgan [name] was seen today in the movement disorders clinic for neurologic consultation at the request of Dr. [name] at [facility] internal medicine.  She is accompanied by her husband who helps to supplement the history.  She has previously seen neurology, both Dr. Krista Blue and Dr. Doy Mince.  I reviewed some of these notes.  The consultation is for the evaluation of Parkinson's disease.  The patient is a 65 y.o. right handed female reports that her first sx was "active dreaming."  However, the symptom that got her to the dr was L greater than R hand tremor, only when moving hands/drying hair.  This was in 2010.  She was started on carbidopa/levodopa just as a "trial" for 2 weeks and then it was stopped because it worked.  She was told that it was diagnostic of PD.  She was then started on the Mirapex ER 1.5 mg in 07/2009. and it was worked up to 3.0 mg.  She reports compulsive shopping (she overspends her "allowance).  She mostly internets shops.  She has always shopped but she has previously not gone over the "allowance."  She plays the lottery and plays more than previous.  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  She tried to get off the medication but it was unsuccessful because of worsening of PD.  She was placed on Azilect.  She has been on it for about 2 years.  She has not noted any benefits.    It was her impression that it made the mirapex last longer.  She feels strongly that the computer work and shopping became an issue because of the azilect.   04/19/13 update:  The patient presents today with her husband who supplements the history.  Once she discontinued the Azilect, she felt that her compulsive shopping got better.  Her husband feels that she is not exercising enough, but the patient estimates that she perhaps exercises 4 days per week, but not to the intensity that her husband would like.  She had one near fall since our last visit, but she was on a trail in [place] with 2 walking sticks and one  of them something to the ground and her husband caught her.  She is still taking clonazepam at night.  Her husband thinks that she takes it too early and falls asleep on the couch, but she denies this.  There have not been hallucinations.  There has not been nausea or vomiting.  She has been slower.   08/13/13 update:  The pt is f/u for PD.  She is accompanied by her husband who supplements the hx.  She is on mirapex 3 mg daily and carbidopa/levodopa 25/100 was added last visit.  She notices that she has more energy and can do more things, although her husband states that she does not necessarily do those things (including exercise). She noted that the feet "wobble" since being on the medication.  She also notices that her shopping "addiction" has returned.  She has had no falls.  No hallucinations.  No near syncope.  09/24/13 update:  The pt is f/u regarding PD.  She is accompanied by her husband who helps to supplement the history.  Last visit, I decreased her pramipexole to 2.25 mg daily secondary to compulsive shopping.  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  They both  agree that this could be pathologic.  She is now on carbidopa/levodopa 25/100, half a tablet 4 times per day.  Dyskinesia in the foot is better.  She is just getting over the flu.  No hallucinations.  She has not started exercising faithfully, primarily because she has not been feeling well.  No falls.    12/24/13 update:  The patient is following up in regards to her Parkinson's disease.  She is accompanied by her husband who helps to supplement the history.  Her Mirapex has been reduced to 1.5 mg daily secondary to compulsive behaviors.  She did call and ask if she could increase it, but I did not want to do that.  She is still having compulsive behaviors (shopping) and her husband  would like her to discontinue the medication altogether.  She is currently on carbidopa/levodopa 25/100, half a tablet 4 times per day.  She takes at approximately 8 AM, noon, 4 PM and 9 PM.  Unfortunately, the patient had dyskinesia at higher dosages.  Nonetheless, she states that she would rather have the dyskinesia than feel like she currently does.  She feels slow and achy and just does not feel like doing anything.  She was not having any hallucinations.  She does state that she signed up for a 5K in January and would like to start training for that.  She is not having any hallucinations.  No falls.  She is still on Cymbalta, 60 mg daily as well as clonazepam for insomnia.  01/16/14 update:  The patient is accompanied by her husband who helps to supplement the history.  Last visit, we started the patient on Rytary, and she is currently on 145 mg 3 times a day.  Unfortunately, the patient reports that she had insomnia on the medication and felt "woozy" and really overall felt better on the carbidopa/levodopa IR and would like to go back to that medication, even though she dyskinesia on low dose.  She asks me if she can wean the Mirapex and restart the levodopa.  [facility].  She is planning on working up to running a 5K at [employer] in January, 2016.  She remains on the Cymbalta for her fibromyalgia.  03/20/14 update:  The patient returns today as a work in, accompanied by her husband who supplements the history.  Much has happened since our last visit.  Last visit, I tried to decrease the patient's Mirapex ER to 0.75 mg daily, primarily because of compulsive behaviors.  I started her on levodopa.  She called me back and wanted to go up on the Mirapex because of cramping in the thigh, but instead I increased her levodopa to 4 times a day.  The cramping did get better, but she called later with dyskinesia.  At that time my partner was on  call and she reluctantly increased the patient's Mirapex back to 1.5 mg daily and decreased patient's levodopa to twice a day dosing.  The patient later called me with continued dyskinesias and wanted to go further up on the Mirapex, but I have been resistant because of her compulsive spending in the past.  Instead, on 03/07/2014 I asked her to try amantadine and called in a prescription for this.  Unfortunately, she stated that she read the side effects and decided that she was not going to pick this up and take it.  She is now only on Mirapex 1.5 mg  daily and levodopa 25/100, one tablet in the morning and half tablet at night.  She would like to go back up on the Mirapex and her husband states that if he has to disable the computer to deal with her compulsive shopping then he will do that.  04/21/14 update:  Pt returns today for follow up, accompanied by her husband who supplements the history.  She is back on mirapex, 1.5 mg daily.  She did not do well on the requip and was pretty insistent on going back on the mirapex despite previous convulsive behaviors.  She wants to go off levodopa 25/100, and she is on it 1/2 bid but often forgets the second dose.  She is able to drive again.   She states that she thinks that she needs to go back up to the 3.0 of the mirapex.  Her husband is not now completely convinced that the mirapex caused compulsive shopping but thinks that boredom did that.  They are getting ready to go on a 14 day [employer] cruise at the end of October.   07/29/14 update:  Pt returns for f/u, accompanied by her husband who supplements the history.  She is now up to 3.0 mg of mirapex ER a day.   She states that she doesn't feel as good as she did when she was on it previously, but she is doing better than she was on the previous medications.  She has some trouble getting off of the sofa.  No falls.  Is doing circuit II class for PD as well as [employer] but this only takes up 2 days a  week and admits isn't doing CV work the remainder of the days.  Husband asks about finding a counselor for her anxiety, which is primarily associated with driving.  Her compulsive shopping has not gotten worse with higher dose of mirapex (feels that it is better as they just got off a cruise and she was previously shopping for the cruise)  11/04/14 update:  Pt returns for f/u, accompanied by her husband who supplements the history.  She did the [employer] since our last visit.  She does state that she was the last one in but she was able to finish.  I had got a call from the rehab unit that the patient had wanted to hold therapy because of fibromyalgia but the patient states that she wanted to continue therapy and perhaps she cancelled one day and not the entire therapy.  She asks me to renew the order.  Just a few days ago (2 nights) the patient backed down on the mirapex from 3.75 mg to 3.0 mg due to EDS and she wasn't sure if it was due to  the mirapex so she wanted to try the lower dosage.  She would like to get a scooter and asks me about that today  02/05/15 update:  The patient is accompanied by her husband who supplements the history.  She is on Mirapex ER 3.0 mg daily.  She thinks that she wants to try again potentially going up on it.  She feels a little slower than usual.  She is doing [employer] classes.     She denies any falls.  No hallucinations.  No lightheadedness or near syncope.  No dyskinesia.  She is supposed to be on Cymbalta for both peripheral neuropathy and fibromyalgia.  She ran out and is now not doing well.  She has been out of it for 2 weeks.  The patient thinks that her fibromyalgia got out of control, she quit exercising so vigorously and then her Parkinson's got worse.  She takes clonazepam as needed for insomnia (3-4 days per week).  They're planning several cruises and several vacations in the near future.  05/26/15 update:  The patient is following up today, accompanied by her husband who  supplements the history.  Last visit, I increased her Mirapex to 3.75 mg daily and restarted her Cymbalta for examination of fibromyalgia and peripheral neuropathy.  She thinks that the cymbalta helped but she thinks the increased mirapex caused more tremor.  She also thinks it causes EDS.  She is falling asleep now in the afternoon and having to take the klonopin late at night to help her sleep (1 mg).  She states that doesn't give her hang over effect.  She wants to go back down again to 3.0 mg of the mirapex.    09/03/15 update:  The patient is following up today, accompanied by her husband who supplements the history.  Last visit, the patient wanted to decrease her pramipexole to 3.0 mg daily.  Last visit, we restarted her Sinemet 25/100, half a tablet 3 times per day.  She states that she ended up d/c her pramipexole all together and increased her Sinemet to 1 po qid.  She feels well as long as she doesn't overdo it.  She remains on Cymbalta, 60 mg daily for peripheral neuropathy and fibromyalgia.  She is also on clonazepam 1 mg nightly for REM behavior disorder and insomnia.  She has been faithfully attending Parkinson's therapies at the neuro-rehabilitation center.  She has not had any falls since our last visit.  No hallucinations.  No lightheadedness or near syncope.  12/17/15 update:  The patient returns today, accompanied by her husband who supplements the history.  Last visit, she was on carbidopa/levodopa 25/100, 1 tablet 4 times per day and had taken herself off of pramipexole.  She has put herself back on pramipexole ER 0.75 mg daily and is on carbidopa/levodopa 25/100 3 times per day.  She denies hallucinations.  She had no falls but did trip over a stool in her closet but didn't fall.   She is doing [employer] class one day a week.  She is riding the recumbant bike.  She is scheduled for her assessment next week for PT/OT.    03/10/16 update:  The patient returns today, accompanied by her  husband who supplements the history.  I have reviewed multiple records since our last visit.  She has seen cardiology and was diagnosed with a left bundle branch block and PVCs which were increased by hypokalemia which was from hydrochlorothiazide.  It was recommended that she decrease the dose of the the HCTZ.  She had an echocardiogram on 01/11/2016 was unremarkable with a normal ejection fraction of 50-55%.  She is on pramipexole ER 0.75 mg daily and carbidopa/levodopa 25/100, one tablet 3 times per day.  She is on clonazepam for REM behavior disorder, 1 mg at night.  She has not had falls since our last visit.  She is having "aching" of the legs.  She was taken off of the lipitor by the PCP but it didn't help.  She originally thought that it was her fibromyalgia but she now wonders if it was the addition of levodopa.  No hallucinations.  No lightheadedness or near syncope.  Continues to chew nicotine gum but states that she is trying to cut down on it.  PREVIOUS MEDICATIONS: Mirapex and azilect; levodopa for a few weeks to "prove" benefit; requip  ALLERGIES:   Allergies  Allergen Reactions  . Other Anaphylaxis    "Anti Seizure Medications"  . Sporanox [Itraconazole]   . Tylenol [Acetaminophen]     CURRENT MEDICATIONS:  Current Outpatient Prescriptions on File Prior to Visit  Medication Sig Dispense Refill  . ALPRAZolam (XANAX) 0.25 MG tablet Take 0.25 mg by mouth daily as needed for anxiety.     Marland Kitchen aspirin 325 MG tablet Take 325 mg by mouth daily.    . Calcium-Magnesium-Vitamin D (CALCIUM 500 PO) Take 500 mg by mouth 2 (two) times daily.    . cholecalciferol (VITAMIN D) 1000 units tablet Take 2,000 Units by mouth daily.    . clonazePAM (KLONOPIN) 1 MG tablet TAKE 1 TABLET BY MOUTH AT BEDTIME 90 tablet 1  . Coenzyme Q10 (VITALINE COQ10) 60 MG TABS Take 60 mg by mouth daily.    . CYMBALTA 60 MG capsule TAKE 1 CAPSULE DAILY 90 capsule 3  . hydrochlorothiazide (HYDRODIURIL) 25 MG tablet  Take 12.5 mg by mouth every other day.     . Magnesium 250 MG TABS Take by mouth 2 (two) times daily.    . mirabegron ER (MYRBETRIQ) 50 MG TB24 tablet Take 50 mg by mouth daily.    . Multiple Vitamins-Minerals (ICAPS PO) Take 1 capsule by mouth daily.     . Pramipexole Dihydrochloride 0.75 MG TB24 TAKE 1 TABLET (0.75 MG TOTAL) BY MOUTH DAILY. 90 tablet 0  . raloxifene (EVISTA) 60 MG tablet Take 60 mg by mouth daily.     No current facility-administered medications on file prior to visit.    PAST MEDICAL HISTORY:   Past Medical History  Diagnosis Date  . Osteopenia   . Hypertension   . Parkinson disease ([place])   . Fibromyalgia   . Hypercholesteremia     controlled on medication  . Melanoma ([place])     PAST SURGICAL HISTORY:   Past Surgical History  Procedure Laterality Date  . Tonsillectomy    . Tubal ligation      SOCIAL HISTORY:   Social History   Social History  . Marital Status: Married    Spouse Name: N/A  . Number of Children: N/A  . 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 Smoking status: Former Smoker    Quit date: 09/29/1997  . Smokeless tobacco: Never Used     Comment: chews [place], 10-12/day  . Alcohol Use: Yes     Comment: Occ  . Drug Use: No  . Sexual Activity: Not on file   Other Topics Concern  . Not on file   Social History Narrative    FAMILY HISTORY:   Family Status  Relation Status Death Age  . Mother Deceased     colon CA  . Father Alive     healthy  . Brother Alive     healthy  . Sister Alive     4, healthy  . Child Alive     healthy    ROS:  A complete 10 system review of systems was obtained and was unremarkable apart from what is mentioned above.  PHYSICAL EXAMINATION:    VITALS:   Filed Vitals:   03/10/16 1538  BP: 138/84  Pulse: 100  Height: 5\' 5"  (1.651 m)  Weight: 178 lb (80.74 kg)  GEN:  The patient appears stated age and  is in NAD. HEENT:  Normocephalic, atraumatic.  The mucous membranes are moist. The superficial temporal arteries are without ropiness or tenderness. CV:  Bigeminy with some heart rate variability (was 40's to 100) Lungs:  CTAB Neck: No bruits   Neurological examination:  Orientation: The patient is alert and oriented x3.   Movement examination: Tone: There is mild increased tone in the left upper and lower extremity.  There is normal tone on the right  Abnormal movements: There is dyskinesia mild to mod in the lower extremities bilaterally Coordination:  There is good RAM's today Gait and Station: The patient has no difficulty arising out of a deep-seated chair without the use of the hands. She is dyskinetic down the hall but there is an astasia abasia quality to the gait.    LABS:  Lab Results  Component Value Date   TSH 0.80 01/15/2013   Lab Results  Component Value Date   [id number] 01/15/2013    SPEP/UPEP negative.    Lab Results  Component Value Date   FOLATE 22.1 01/15/2013     ASSESSMENT/PLAN:  1.  Idiopathic akinetic rigid Parkinson's disease.    -We discussed the diagnosis as well as pathophysiology of the disease.  We discussed treatment options as well as prognostic indicators.  Patient education was provided.  -continue on levodopa to 25/100 tid.  She is on mirapex er 0.75 mg daily  -We again talked about the importance of exercise.   2.  Fibromyalgia  -on cymbalta   -dont think that myalgias are from the addition of levodopa last September.  Stopping lipitor didn't help.  -try to start low dose neurontin - 100 mg qhs.  Will call her in a few weeks and we will see if she needs higher dose 3.  probable peripheral neuropathy, idiopathic.  -Back on Cymbalta both for peripheral neuropathy as well as fibromyalgia. 4.  history of melanoma.  -There is some old literature that suggests that the addition of levodopa can increase risk of return melanoma, but new  literature suggests that this is not the etiology.   -She is having skin examininations 5.  history of tobacco abuse.  -still chewing nicotine gum and talked again today about d/c that 6.  Insomnia with some RBD  -She is doing fine with clonazepam.  She is on 1 mg nightly 7.  I will see her back in follow-up in the next 3 or 4 months, sooner should new neurologic issues arise.  Much greater than 50% of the 25 minute visit in counseling once again.

## 2016-03-22 DIAGNOSIS — H353211 Exudative age-related macular degeneration, right eye, with active choroidal neovascularization: Secondary | ICD-10-CM | POA: Diagnosis not present

## 2016-03-22 DIAGNOSIS — H353122 Nonexudative age-related macular degeneration, left eye, intermediate dry stage: Secondary | ICD-10-CM | POA: Diagnosis not present

## 2016-03-23 DIAGNOSIS — Z808 Family history of malignant neoplasm of other organs or systems: Secondary | ICD-10-CM | POA: Diagnosis not present

## 2016-03-23 DIAGNOSIS — L219 Seborrheic dermatitis, unspecified: Secondary | ICD-10-CM | POA: Diagnosis not present

## 2016-03-23 DIAGNOSIS — L821 Other seborrheic keratosis: Secondary | ICD-10-CM | POA: Diagnosis not present

## 2016-03-23 DIAGNOSIS — D2271 Melanocytic nevi of right lower limb, including hip: Secondary | ICD-10-CM | POA: Diagnosis not present

## 2016-03-23 DIAGNOSIS — Z85820 Personal history of malignant melanoma of skin: Secondary | ICD-10-CM | POA: Diagnosis not present

## 2016-03-23 DIAGNOSIS — B360 Pityriasis versicolor: Secondary | ICD-10-CM | POA: Diagnosis not present

## 2016-03-24 ENCOUNTER — Telehealth: Payer: Self-pay | Admitting: Neurology

## 2016-03-24 NOTE — Telephone Encounter (Signed)
Increase Gabapentin to 100mg  BID, monitor for sleepiness. Hold off on driving until she sees how she is doing on higher dose. Thanks

## 2016-03-24 NOTE — Telephone Encounter (Signed)
Spoke with patient. She said she already increased her Gabapentin on her own 4 days ago to twice daily and it still isn't helping. Please advise if she should increase, stay the same, or stop medication.

## 2016-03-24 NOTE — Telephone Encounter (Signed)
Patient was started on Gabapentin 100 mg hs at last visit. Last office note advises if not helping can increase. Please advise dosage increase?

## 2016-03-24 NOTE — Telephone Encounter (Signed)
She can increase dose to three times daily.  If not better in one week, she can increase dose to 200mg  three times daily.

## 2016-03-24 NOTE — Telephone Encounter (Signed)
Patient made aware. She will call for refills with increased dosage if this helps.

## 2016-03-29 ENCOUNTER — Telehealth: Payer: Self-pay | Admitting: Neurology

## 2016-03-29 DIAGNOSIS — H353211 Exudative age-related macular degeneration, right eye, with active choroidal neovascularization: Secondary | ICD-10-CM | POA: Diagnosis not present

## 2016-03-29 MED ORDER — GABAPENTIN 100 MG PO CAPS: 200.0000 mg | ORAL_CAPSULE | Freq: Three times a day (TID) | ORAL | 1 refills | Status: DC

## 2016-03-29 NOTE — Telephone Encounter (Signed)
Needed updated prescription for Gabapentin with higher dosage amount sent to pharmacy. Done.

## 2016-03-29 NOTE — Telephone Encounter (Signed)
Left message on machine for patient to call back.

## 2016-04-07 ENCOUNTER — Ambulatory Visit: Payer: Federal, State, Local not specified - PPO | Admitting: Physical Therapy

## 2016-04-18 ENCOUNTER — Telehealth: Payer: Self-pay | Admitting: Neurology

## 2016-04-18 DIAGNOSIS — M797 Fibromyalgia: Secondary | ICD-10-CM | POA: Diagnosis not present

## 2016-04-18 DIAGNOSIS — I10 Essential (primary) hypertension: Secondary | ICD-10-CM | POA: Diagnosis not present

## 2016-04-18 DIAGNOSIS — M8580 Other specified disorders of bone density and structure, unspecified site: Secondary | ICD-10-CM | POA: Diagnosis not present

## 2016-04-18 DIAGNOSIS — Z7982 Long term (current) use of aspirin: Secondary | ICD-10-CM | POA: Diagnosis not present

## 2016-04-18 DIAGNOSIS — Z87891 Personal history of nicotine dependence: Secondary | ICD-10-CM | POA: Diagnosis not present

## 2016-04-18 DIAGNOSIS — G20 Parkinson's disease: Secondary | ICD-10-CM | POA: Diagnosis not present

## 2016-04-18 NOTE — Telephone Encounter (Signed)
VM-Sandy from [facility] called in regards to PT/Dawn CB#[phone]

## 2016-04-18 NOTE — Telephone Encounter (Signed)
Tried to call back- was transferred to three different wrong people and then a wrong person's voicemail.

## 2016-04-20 DIAGNOSIS — G20 Parkinson's disease: Secondary | ICD-10-CM | POA: Diagnosis not present

## 2016-04-20 DIAGNOSIS — Z87891 Personal history of nicotine dependence: Secondary | ICD-10-CM | POA: Diagnosis not present

## 2016-04-20 DIAGNOSIS — M8580 Other specified disorders of bone density and structure, unspecified site: Secondary | ICD-10-CM | POA: Diagnosis not present

## 2016-04-20 DIAGNOSIS — I10 Essential (primary) hypertension: Secondary | ICD-10-CM | POA: Diagnosis not present

## 2016-04-20 DIAGNOSIS — M797 Fibromyalgia: Secondary | ICD-10-CM | POA: Diagnosis not present

## 2016-04-20 DIAGNOSIS — Z7982 Long term (current) use of aspirin: Secondary | ICD-10-CM | POA: Diagnosis not present

## 2016-04-21 ENCOUNTER — Telehealth: Payer: Self-pay | Admitting: Neurology

## 2016-04-21 NOTE — Telephone Encounter (Signed)
Verbal order given for patient to be seen by PT 2x week for 2 weeks,  3x week for 2 weeks, and 2 xweek for 2 weeks. Order given to [place] Arville Go) at [phone].

## 2016-04-26 DIAGNOSIS — I10 Essential (primary) hypertension: Secondary | ICD-10-CM | POA: Diagnosis not present

## 2016-04-26 DIAGNOSIS — Z7982 Long term (current) use of aspirin: Secondary | ICD-10-CM | POA: Diagnosis not present

## 2016-04-26 DIAGNOSIS — G20 Parkinson's disease: Secondary | ICD-10-CM | POA: Diagnosis not present

## 2016-04-26 DIAGNOSIS — Z87891 Personal history of nicotine dependence: Secondary | ICD-10-CM | POA: Diagnosis not present

## 2016-04-26 DIAGNOSIS — M8580 Other specified disorders of bone density and structure, unspecified site: Secondary | ICD-10-CM | POA: Diagnosis not present

## 2016-04-26 DIAGNOSIS — M797 Fibromyalgia: Secondary | ICD-10-CM | POA: Diagnosis not present

## 2016-04-28 DIAGNOSIS — M797 Fibromyalgia: Secondary | ICD-10-CM | POA: Diagnosis not present

## 2016-04-28 DIAGNOSIS — Z7982 Long term (current) use of aspirin: Secondary | ICD-10-CM | POA: Diagnosis not present

## 2016-04-28 DIAGNOSIS — I10 Essential (primary) hypertension: Secondary | ICD-10-CM | POA: Diagnosis not present

## 2016-04-28 DIAGNOSIS — M8580 Other specified disorders of bone density and structure, unspecified site: Secondary | ICD-10-CM | POA: Diagnosis not present

## 2016-04-28 DIAGNOSIS — Z87891 Personal history of nicotine dependence: Secondary | ICD-10-CM | POA: Diagnosis not present

## 2016-04-28 DIAGNOSIS — G20 Parkinson's disease: Secondary | ICD-10-CM | POA: Diagnosis not present

## 2016-04-29 DIAGNOSIS — I10 Essential (primary) hypertension: Secondary | ICD-10-CM | POA: Diagnosis not present

## 2016-04-29 DIAGNOSIS — Z87891 Personal history of nicotine dependence: Secondary | ICD-10-CM | POA: Diagnosis not present

## 2016-04-29 DIAGNOSIS — M8580 Other specified disorders of bone density and structure, unspecified site: Secondary | ICD-10-CM | POA: Diagnosis not present

## 2016-04-29 DIAGNOSIS — G20 Parkinson's disease: Secondary | ICD-10-CM | POA: Diagnosis not present

## 2016-04-29 DIAGNOSIS — M797 Fibromyalgia: Secondary | ICD-10-CM | POA: Diagnosis not present

## 2016-04-29 DIAGNOSIS — Z7982 Long term (current) use of aspirin: Secondary | ICD-10-CM | POA: Diagnosis not present

## 2016-05-02 ENCOUNTER — Other Ambulatory Visit: Payer: Self-pay | Admitting: Neurology

## 2016-05-03 DIAGNOSIS — Z7982 Long term (current) use of aspirin: Secondary | ICD-10-CM | POA: Diagnosis not present

## 2016-05-03 DIAGNOSIS — M797 Fibromyalgia: Secondary | ICD-10-CM | POA: Diagnosis not present

## 2016-05-03 DIAGNOSIS — M8580 Other specified disorders of bone density and structure, unspecified site: Secondary | ICD-10-CM | POA: Diagnosis not present

## 2016-05-03 DIAGNOSIS — Z87891 Personal history of nicotine dependence: Secondary | ICD-10-CM | POA: Diagnosis not present

## 2016-05-03 DIAGNOSIS — G20 Parkinson's disease: Secondary | ICD-10-CM | POA: Diagnosis not present

## 2016-05-03 DIAGNOSIS — I10 Essential (primary) hypertension: Secondary | ICD-10-CM | POA: Diagnosis not present

## 2016-05-04 DIAGNOSIS — M797 Fibromyalgia: Secondary | ICD-10-CM | POA: Diagnosis not present

## 2016-05-04 DIAGNOSIS — I10 Essential (primary) hypertension: Secondary | ICD-10-CM | POA: Diagnosis not present

## 2016-05-04 DIAGNOSIS — Z7982 Long term (current) use of aspirin: Secondary | ICD-10-CM | POA: Diagnosis not present

## 2016-05-04 DIAGNOSIS — M8580 Other specified disorders of bone density and structure, unspecified site: Secondary | ICD-10-CM | POA: Diagnosis not present

## 2016-05-04 DIAGNOSIS — Z87891 Personal history of nicotine dependence: Secondary | ICD-10-CM | POA: Diagnosis not present

## 2016-05-04 DIAGNOSIS — G20 Parkinson's disease: Secondary | ICD-10-CM | POA: Diagnosis not present

## 2016-05-05 DIAGNOSIS — M8580 Other specified disorders of bone density and structure, unspecified site: Secondary | ICD-10-CM | POA: Diagnosis not present

## 2016-05-05 DIAGNOSIS — Z87891 Personal history of nicotine dependence: Secondary | ICD-10-CM | POA: Diagnosis not present

## 2016-05-05 DIAGNOSIS — I10 Essential (primary) hypertension: Secondary | ICD-10-CM | POA: Diagnosis not present

## 2016-05-05 DIAGNOSIS — M797 Fibromyalgia: Secondary | ICD-10-CM | POA: Diagnosis not present

## 2016-05-05 DIAGNOSIS — Z7982 Long term (current) use of aspirin: Secondary | ICD-10-CM | POA: Diagnosis not present

## 2016-05-05 DIAGNOSIS — G20 Parkinson's disease: Secondary | ICD-10-CM | POA: Diagnosis not present

## 2016-05-09 DIAGNOSIS — M797 Fibromyalgia: Secondary | ICD-10-CM | POA: Diagnosis not present

## 2016-05-09 DIAGNOSIS — G20 Parkinson's disease: Secondary | ICD-10-CM | POA: Diagnosis not present

## 2016-05-09 DIAGNOSIS — I10 Essential (primary) hypertension: Secondary | ICD-10-CM | POA: Diagnosis not present

## 2016-05-09 DIAGNOSIS — Z7982 Long term (current) use of aspirin: Secondary | ICD-10-CM | POA: Diagnosis not present

## 2016-05-09 DIAGNOSIS — M8580 Other specified disorders of bone density and structure, unspecified site: Secondary | ICD-10-CM | POA: Diagnosis not present

## 2016-05-09 DIAGNOSIS — Z87891 Personal history of nicotine dependence: Secondary | ICD-10-CM | POA: Diagnosis not present

## 2016-05-10 DIAGNOSIS — H353211 Exudative age-related macular degeneration, right eye, with active choroidal neovascularization: Secondary | ICD-10-CM | POA: Diagnosis not present

## 2016-05-11 DIAGNOSIS — I10 Essential (primary) hypertension: Secondary | ICD-10-CM | POA: Diagnosis not present

## 2016-05-11 DIAGNOSIS — M797 Fibromyalgia: Secondary | ICD-10-CM | POA: Diagnosis not present

## 2016-05-11 DIAGNOSIS — M8580 Other specified disorders of bone density and structure, unspecified site: Secondary | ICD-10-CM | POA: Diagnosis not present

## 2016-05-11 DIAGNOSIS — G20 Parkinson's disease: Secondary | ICD-10-CM | POA: Diagnosis not present

## 2016-05-11 DIAGNOSIS — Z7982 Long term (current) use of aspirin: Secondary | ICD-10-CM | POA: Diagnosis not present

## 2016-05-11 DIAGNOSIS — Z87891 Personal history of nicotine dependence: Secondary | ICD-10-CM | POA: Diagnosis not present

## 2016-05-16 DIAGNOSIS — M797 Fibromyalgia: Secondary | ICD-10-CM | POA: Diagnosis not present

## 2016-05-16 DIAGNOSIS — Z87891 Personal history of nicotine dependence: Secondary | ICD-10-CM | POA: Diagnosis not present

## 2016-05-16 DIAGNOSIS — G20 Parkinson's disease: Secondary | ICD-10-CM | POA: Diagnosis not present

## 2016-05-16 DIAGNOSIS — Z7982 Long term (current) use of aspirin: Secondary | ICD-10-CM | POA: Diagnosis not present

## 2016-05-16 DIAGNOSIS — I10 Essential (primary) hypertension: Secondary | ICD-10-CM | POA: Diagnosis not present

## 2016-05-16 DIAGNOSIS — M8580 Other specified disorders of bone density and structure, unspecified site: Secondary | ICD-10-CM | POA: Diagnosis not present

## 2016-05-18 DIAGNOSIS — G20 Parkinson's disease: Secondary | ICD-10-CM | POA: Diagnosis not present

## 2016-05-18 DIAGNOSIS — I10 Essential (primary) hypertension: Secondary | ICD-10-CM | POA: Diagnosis not present

## 2016-05-18 DIAGNOSIS — M797 Fibromyalgia: Secondary | ICD-10-CM | POA: Diagnosis not present

## 2016-05-18 DIAGNOSIS — Z7982 Long term (current) use of aspirin: Secondary | ICD-10-CM | POA: Diagnosis not present

## 2016-05-18 DIAGNOSIS — M8580 Other specified disorders of bone density and structure, unspecified site: Secondary | ICD-10-CM | POA: Diagnosis not present

## 2016-05-18 DIAGNOSIS — Z87891 Personal history of nicotine dependence: Secondary | ICD-10-CM | POA: Diagnosis not present

## 2016-05-31 ENCOUNTER — Other Ambulatory Visit: Payer: Self-pay | Admitting: Neurology

## 2016-06-15 DIAGNOSIS — H353211 Exudative age-related macular degeneration, right eye, with active choroidal neovascularization: Secondary | ICD-10-CM | POA: Diagnosis not present

## 2016-07-06 NOTE — Progress Notes (Signed)
Morgan [name] was seen today in the movement disorders clinic for neurologic consultation at the request of Dr. [name] at [facility] internal medicine.  She is accompanied by her husband who helps to supplement the history.  She has previously seen neurology, both Dr. Krista Blue and Dr. Doy Mince.  I reviewed some of these notes.  The consultation is for the evaluation of Parkinson's disease.  The patient is a 65 y.o. right handed female reports that her first sx was "active dreaming."  However, the symptom that got her to the dr was L greater than R hand tremor, only when moving hands/drying hair.  This was in 2010.  She was started on carbidopa/levodopa just as a "trial" for 2 weeks and then it was stopped because it worked.  She was told that it was diagnostic of PD.  She was then started on the Mirapex ER 1.5 mg in 07/2009. and it was worked up to 3.0 mg.  She reports compulsive shopping (she overspends her "allowance).  She mostly internets shops.  She has always shopped but she has previously not gone over the "allowance."  She plays the lottery and plays more than previous.  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  She tried to get off the medication but it was unsuccessful because of worsening of PD.  She was placed on Azilect.  She has been on it for about 2 years.  She has not noted any benefits.    It was her impression that it made the mirapex last longer.  She feels strongly that the computer work and shopping became an issue because of the azilect.   04/19/13 update:  The patient presents today with her husband who supplements the history.  Once she discontinued the Azilect, she felt that her compulsive shopping got better.  Her husband feels that she is not exercising enough, but the patient estimates that she perhaps exercises 4 days per week, but not to the intensity that her husband would like.  She had one near fall since our last visit, but she was on a trail in [place] with 2 walking sticks and one  of them something to the ground and her husband caught her.  She is still taking clonazepam at night.  Her husband thinks that she takes it too early and falls asleep on the couch, but she denies this.  There have not been hallucinations.  There has not been nausea or vomiting.  She has been slower.   08/13/13 update:  The pt is f/u for PD.  She is accompanied by her husband who supplements the hx.  She is on mirapex 3 mg daily and carbidopa/levodopa 25/100 was added last visit.  She notices that she has more energy and can do more things, although her husband states that she does not necessarily do those things (including exercise). She noted that the feet "wobble" since being on the medication.  She also notices that her shopping "addiction" has returned.  She has had no falls.  No hallucinations.  No near syncope.  09/24/13 update:  The pt is f/u regarding PD.  She is accompanied by her husband who helps to supplement the history.  Last visit, I decreased her pramipexole to 2.25 mg daily secondary to compulsive shopping.  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  They both  agree that this could be pathologic.  She is now on carbidopa/levodopa 25/100, half a tablet 4 times per day.  Dyskinesia in the foot is better.  She is just getting over the flu.  No hallucinations.  She has not started exercising faithfully, primarily because she has not been feeling well.  No falls.    12/24/13 update:  The patient is following up in regards to her Parkinson's disease.  She is accompanied by her husband who helps to supplement the history.  Her Mirapex has been reduced to 1.5 mg daily secondary to compulsive behaviors.  She did call and ask if she could increase it, but I did not want to do that.  She is still having compulsive behaviors (shopping) and her husband  would like her to discontinue the medication altogether.  She is currently on carbidopa/levodopa 25/100, half a tablet 4 times per day.  She takes at approximately 8 AM, noon, 4 PM and 9 PM.  Unfortunately, the patient had dyskinesia at higher dosages.  Nonetheless, she states that she would rather have the dyskinesia than feel like she currently does.  She feels slow and achy and just does not feel like doing anything.  She was not having any hallucinations.  She does state that she signed up for a 5K in January and would like to start training for that.  She is not having any hallucinations.  No falls.  She is still on Cymbalta, 60 mg daily as well as clonazepam for insomnia.  01/16/14 update:  The patient is accompanied by her husband who helps to supplement the history.  Last visit, we started the patient on Rytary, and she is currently on 145 mg 3 times a day.  Unfortunately, the patient reports that she had insomnia on the medication and felt "woozy" and really overall felt better on the carbidopa/levodopa IR and would like to go back to that medication, even though she dyskinesia on low dose.  She asks me if she can wean the Mirapex and restart the levodopa.  [facility].  She is planning on working up to running a 5K at [employer] in January, 2016.  She remains on the Cymbalta for her fibromyalgia.  03/20/14 update:  The patient returns today as a work in, accompanied by her husband who supplements the history.  Much has happened since our last visit.  Last visit, I tried to decrease the patient's Mirapex ER to 0.75 mg daily, primarily because of compulsive behaviors.  I started her on levodopa.  She called me back and wanted to go up on the Mirapex because of cramping in the thigh, but instead I increased her levodopa to 4 times a day.  The cramping did get better, but she called later with dyskinesia.  At that time my partner was on  call and she reluctantly increased the patient's Mirapex back to 1.5 mg daily and decreased patient's levodopa to twice a day dosing.  The patient later called me with continued dyskinesias and wanted to go further up on the Mirapex, but I have been resistant because of her compulsive spending in the past.  Instead, on 03/07/2014 I asked her to try amantadine and called in a prescription for this.  Unfortunately, she stated that she read the side effects and decided that she was not going to pick this up and take it.  She is now only on Mirapex 1.5 mg  daily and levodopa 25/100, one tablet in the morning and half tablet at night.  She would like to go back up on the Mirapex and her husband states that if he has to disable the computer to deal with her compulsive shopping then he will do that.  04/21/14 update:  Pt returns today for follow up, accompanied by her husband who supplements the history.  She is back on mirapex, 1.5 mg daily.  She did not do well on the requip and was pretty insistent on going back on the mirapex despite previous convulsive behaviors.  She wants to go off levodopa 25/100, and she is on it 1/2 bid but often forgets the second dose.  She is able to drive again.   She states that she thinks that she needs to go back up to the 3.0 of the mirapex.  Her husband is not now completely convinced that the mirapex caused compulsive shopping but thinks that boredom did that.  They are getting ready to go on a 14 day [employer] cruise at the end of October.   07/29/14 update:  Pt returns for f/u, accompanied by her husband who supplements the history.  She is now up to 3.0 mg of mirapex ER a day.   She states that she doesn't feel as good as she did when she was on it previously, but she is doing better than she was on the previous medications.  She has some trouble getting off of the sofa.  No falls.  Is doing circuit II class for PD as well as [employer] but this only takes up 2 days a  week and admits isn't doing CV work the remainder of the days.  Husband asks about finding a counselor for her anxiety, which is primarily associated with driving.  Her compulsive shopping has not gotten worse with higher dose of mirapex (feels that it is better as they just got off a cruise and she was previously shopping for the cruise)  11/04/14 update:  Pt returns for f/u, accompanied by her husband who supplements the history.  She did the [employer] since our last visit.  She does state that she was the last one in but she was able to finish.  I had got a call from the rehab unit that the patient had wanted to hold therapy because of fibromyalgia but the patient states that she wanted to continue therapy and perhaps she cancelled one day and not the entire therapy.  She asks me to renew the order.  Just a few days ago (2 nights) the patient backed down on the mirapex from 3.75 mg to 3.0 mg due to EDS and she wasn't sure if it was due to  the mirapex so she wanted to try the lower dosage.  She would like to get a scooter and asks me about that today  02/05/15 update:  The patient is accompanied by her husband who supplements the history.  She is on Mirapex ER 3.0 mg daily.  She thinks that she wants to try again potentially going up on it.  She feels a little slower than usual.  She is doing [employer] classes.     She denies any falls.  No hallucinations.  No lightheadedness or near syncope.  No dyskinesia.  She is supposed to be on Cymbalta for both peripheral neuropathy and fibromyalgia.  She ran out and is now not doing well.  She has been out of it for 2 weeks.  The patient thinks that her fibromyalgia got out of control, she quit exercising so vigorously and then her Parkinson's got worse.  She takes clonazepam as needed for insomnia (3-4 days per week).  They're planning several cruises and several vacations in the near future.  05/26/15 update:  The patient is following up today, accompanied by her husband who  supplements the history.  Last visit, I increased her Mirapex to 3.75 mg daily and restarted her Cymbalta for examination of fibromyalgia and peripheral neuropathy.  She thinks that the cymbalta helped but she thinks the increased mirapex caused more tremor.  She also thinks it causes EDS.  She is falling asleep now in the afternoon and having to take the klonopin late at night to help her sleep (1 mg).  She states that doesn't give her hang over effect.  She wants to go back down again to 3.0 mg of the mirapex.    09/03/15 update:  The patient is following up today, accompanied by her husband who supplements the history.  Last visit, the patient wanted to decrease her pramipexole to 3.0 mg daily.  Last visit, we restarted her Sinemet 25/100, half a tablet 3 times per day.  She states that she ended up d/c her pramipexole all together and increased her Sinemet to 1 po qid.  She feels well as long as she doesn't overdo it.  She remains on Cymbalta, 60 mg daily for peripheral neuropathy and fibromyalgia.  She is also on clonazepam 1 mg nightly for REM behavior disorder and insomnia.  She has been faithfully attending Parkinson's therapies at the neuro-rehabilitation center.  She has not had any falls since our last visit.  No hallucinations.  No lightheadedness or near syncope.  12/17/15 update:  The patient returns today, accompanied by her husband who supplements the history.  Last visit, she was on carbidopa/levodopa 25/100, 1 tablet 4 times per day and had taken herself off of pramipexole.  She has put herself back on pramipexole ER 0.75 mg daily and is on carbidopa/levodopa 25/100 3 times per day.  She denies hallucinations.  She had no falls but did trip over a stool in her closet but didn't fall.   She is doing [employer] class one day a week.  She is riding the recumbant bike.  She is scheduled for her assessment next week for PT/OT.    03/10/16 update:  The patient returns today, accompanied by her  husband who supplements the history.  I have reviewed multiple records since our last visit.  She has seen cardiology and was diagnosed with a left bundle branch block and PVCs which were increased by hypokalemia which was from hydrochlorothiazide.  It was recommended that she decrease the dose of the the HCTZ.  She had an echocardiogram on 01/11/2016 was unremarkable with a normal ejection fraction of 50-55%.  She is on pramipexole ER 0.75 mg daily and carbidopa/levodopa 25/100, one tablet 3 times per day.  She is on clonazepam for REM behavior disorder, 1 mg at night.  She has not had falls since our last visit.  She is having "aching" of the legs.  She was taken off of the lipitor by the PCP but it didn't help.  She originally thought that it was her fibromyalgia but she now wonders if it was the addition of levodopa.  No hallucinations.  No lightheadedness or near syncope.  Continues to chew nicotine gum but states that she is trying to cut down on it.  07/07/16 update:  Patient follows up today, accompanied by her husband who supplements the history.  She is on carbidopa/levodopa 25/100, one tablet 3 times per day (7am/2pm/8pm) and pramipexole ER, 0.75 mg daily.  She is also on clonazepam for REM behavior disorder, 1 mg at night.  She had one fall since last visit; she tripped over the dog.  She denies any hallucinations.  She denies lightheadedness or near syncope.  She remains on Cymbalta for her fibromyalgia, but gabapentin was started last visit and she has worked up to 100 mg, 2 po tid.  The gabapentin helped "a little" but not a lot.  The in home PT helped some.  Her and her husband continue to travel significantly via their RV.   PREVIOUS MEDICATIONS: Mirapex and azilect; levodopa for a few weeks to "prove" benefit; requip  ALLERGIES:   Allergies  Allergen Reactions  . Other Anaphylaxis    "Anti Seizure Medications"  . Sporanox [Itraconazole]   . Tylenol [Acetaminophen]     CURRENT  MEDICATIONS:  Current Outpatient Prescriptions on File Prior to Visit  Medication Sig Dispense Refill  . ALPRAZolam (XANAX) 0.25 MG tablet Take 0.25 mg by mouth daily as needed for anxiety.     Marland Kitchen aspirin 325 MG tablet Take 325 mg by mouth daily.    . Calcium-Magnesium-Vitamin D (CALCIUM 500 PO) Take 500 mg by mouth 2 (two) times daily.    . cholecalciferol (VITAMIN D) 1000 units tablet Take 2,000 Units by mouth daily.    . clonazePAM (KLONOPIN) 1 MG tablet TAKE 1 TABLET BY MOUTH AT BEDTIME 90 tablet 1  . Coenzyme Q10 (VITALINE COQ10) 60 MG TABS Take 60 mg by mouth daily.    . CYMBALTA 60 MG capsule TAKE 1 CAPSULE DAILY 90 capsule 3  . gabapentin (NEURONTIN) 100 MG capsule TAKE 2 CAPSULES 3 TIMES A DAY 180 capsule 1  . Magnesium 250 MG TABS Take by mouth 2 (two) times daily.    . Multiple Vitamins-Minerals (ICAPS PO) Take 1 capsule by mouth daily.     . Pramipexole Dihydrochloride 0.75 MG TB24 TAKE 1 TABLET (0.75 MG TOTAL) BY MOUTH DAILY. 90 tablet 1  . raloxifene (EVISTA) 60 MG tablet Take 60 mg by mouth daily.    Marland Kitchen SINEMET 25-100 MG tablet Take 1 tablet by mouth 3 (three) times daily. 270 tablet 3   No current facility-administered medications on file prior to visit.     PAST MEDICAL HISTORY:   Past Medical History:  Diagnosis Date  . Fibromyalgia   . Hypercholesteremia    controlled on medication  . Hypertension   . Melanoma ([place])   . Osteopenia   . Parkinson disease ([place])     PAST SURGICAL HISTORY:   Past Surgical History:  Procedure Laterality Date  . TONSILLECTOMY    . TUBAL LIGATION      SOCIAL HISTORY:   Social History   Social History  . Marital status: Married    Spouse name: N/A  . Number of children: N/A  . Years of education: N/A   Occupational History  . 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 Smoking status: Former Smoker    Quit date: 09/29/1997  . Smokeless tobacco: Never Used     Comment: chews [place], 10-12/day   . Alcohol use Yes     Comment: Occ  . Drug use: No  . Sexual activity: Not on file   Other Topics Concern  .  Not on file   Social History Narrative  . No narrative on file    FAMILY HISTORY:   Family Status  Relation Status  . Mother Deceased   colon CA  . Father Alive   healthy  . Brother Alive   healthy  . Sister Alive   4, healthy  . Child Alive   healthy    ROS:  A complete 10 system review of systems was obtained and was unremarkable apart from what is mentioned above.  PHYSICAL EXAMINATION:    VITALS:   Vitals:   07/07/16 1434  BP: 140/82  Pulse: 92  Weight: 175 lb (79.4 kg)  Height: 5\' 5"  (1.651 m)    GEN:  The patient appears stated age and is in NAD. HEENT:  Normocephalic, atraumatic.  The mucous membranes are moist. The superficial temporal arteries are without ropiness or tenderness. CV:  RRR Lungs:  CTAB Neck: No bruits   Neurological examination:  Orientation: The patient is alert and oriented x3.   Movement examination: Tone: There is normal tone in the UE/LE.   Abnormal movements: There is no tremor.  There is very little dyskinesia. Coordination:  There is good RAM's today Gait and Station: The patient has no difficulty arising out of a deep-seated chair without the use of the hands. She has very little dyskinesia.  When initially coming in, she walks with the walking stick and is a bit antalgic.  When testing gait, she has a fairly normal gait with purpopseful arm swing.    LABS:  Lab Results  Component Value Date   TSH 0.80 01/15/2013   Lab Results  Component Value Date   [id number] 01/15/2013    SPEP/UPEP negative.    Lab Results  Component Value Date   FOLATE 22.1 01/15/2013     ASSESSMENT/PLAN:  1.  Idiopathic akinetic rigid Parkinson's disease.    -We discussed the diagnosis as well as pathophysiology of the disease.  We discussed treatment options as well as prognostic indicators.  Patient education was  provided.  -continue on levodopa to 25/100 tid.  She is on mirapex er 0.75 mg daily  -We again talked about the importance of exercise.  I encouraged her to get a rock study boxing.  Talked about how this would benefit her.  Gave her patient education on this. 2.  Fibromyalgia  -on cymbalta   -dont think that myalgias are from the addition of levodopa last September.  Stopping lipitor didn't help.  -increase gabapentin to 300 mg tid. 3.  probable peripheral neuropathy, idiopathic.  -Back on Cymbalta both for peripheral neuropathy as well as fibromyalgia. 4.  history of melanoma.  -There is some old literature that suggests that the addition of levodopa can increase risk of return melanoma, but new literature suggests that this is not the etiology.   -She is having skin examininations 5.  history of tobacco abuse.  -still chewing nicotine gum and talked again today about d/c that 6.  Insomnia with some RBD  -She is doing fine with clonazepam.  She is on 1 mg nightly 7.  I will see her back in follow-up in the next 3 or 4 months, sooner should new neurologic issues arise.  Much greater than 50% of the 35 minute visit in counseling once again.

## 2016-07-07 ENCOUNTER — Encounter: Payer: Self-pay | Admitting: Neurology

## 2016-07-07 ENCOUNTER — Ambulatory Visit (INDEPENDENT_AMBULATORY_CARE_PROVIDER_SITE_OTHER): Payer: Medicare Other | Admitting: Neurology

## 2016-07-07 VITALS — BP 140/82 | HR 92 | Ht 65.0 in | Wt 175.0 lb

## 2016-07-07 DIAGNOSIS — G20 Parkinson's disease: Secondary | ICD-10-CM | POA: Diagnosis not present

## 2016-07-07 DIAGNOSIS — M797 Fibromyalgia: Secondary | ICD-10-CM | POA: Diagnosis not present

## 2016-07-07 DIAGNOSIS — G609 Hereditary and idiopathic neuropathy, unspecified: Secondary | ICD-10-CM | POA: Diagnosis not present

## 2016-07-07 MED ORDER — GABAPENTIN 300 MG PO CAPS: 300.0000 mg | ORAL_CAPSULE | Freq: Three times a day (TID) | ORAL | 1 refills | Status: DC

## 2016-07-14 ENCOUNTER — Ambulatory Visit: Payer: Medicare Other | Admitting: Neurology

## 2016-07-25 DIAGNOSIS — Z1231 Encounter for screening mammogram for malignant neoplasm of breast: Secondary | ICD-10-CM | POA: Diagnosis not present

## 2016-07-28 ENCOUNTER — Ambulatory Visit: Payer: Medicare Other | Admitting: Occupational Therapy

## 2016-07-28 ENCOUNTER — Telehealth: Payer: Self-pay | Admitting: Neurology

## 2016-07-28 ENCOUNTER — Ambulatory Visit: Payer: Medicare Other | Attending: Family Medicine | Admitting: Physical Therapy

## 2016-07-28 ENCOUNTER — Ambulatory Visit: Payer: Medicare Other

## 2016-07-28 DIAGNOSIS — R278 Other lack of coordination: Secondary | ICD-10-CM

## 2016-07-28 DIAGNOSIS — R1310 Dysphagia, unspecified: Secondary | ICD-10-CM | POA: Insufficient documentation

## 2016-07-28 DIAGNOSIS — R2689 Other abnormalities of gait and mobility: Secondary | ICD-10-CM | POA: Insufficient documentation

## 2016-07-28 NOTE — Telephone Encounter (Signed)
Left message on machine for patient to call back.

## 2016-07-28 NOTE — Therapy (Signed)
[facility] [address] [place], [place], [address] Phone: [phone]   Fax:  [phone]  Patient Details  Name: Morgan [name] MRN: [id number] Date of Birth: Feb 01, 1951 Referring Provider:  [name], MD  Encounter Date: 07/28/2016  Occupational Therapy Parkinson's Disease Screen  Physical Performance Test item #4 (donning/doffing jacket):  15.21 sec  9-hole peg test:    RUE  20.12 sec        LUE  25.59 sec (R-handed)  Box & Blocks Test:   RUE  56 blocks        LUE  52 blocks  Change in ability to perform ADLs/IADLs:  No longer driving.  Other Comments:  Bradykinesia noted with grasping.  Also noted shuffling of feet with distractions/conversations when ambulating.  Pt does not require occupation therapy services at this time.  Recommended occupational therapy screen in  6-9 months.  Pt has some minor slowing of scores, but reports desire to resume community exercises classes at this time.  Pt educated in indications/possible complications that would warrant eval sooner.       [facility] 07/28/2016, 10:28 AM  [facility] [address] [place], [place], [address] Phone: [phone]   Fax:  [employer], OTR/L [facility] [address] [place], [place]  [address] [phone] phone [phone] 07/28/16 10:51 AM

## 2016-07-28 NOTE — Therapy (Signed)
[facility] [address] [place], [place], [address] Phone: [phone]   Fax:  [phone]  Patient Details  Name: Morgan [name] MRN: [id number] Date of Birth: [birth year] Referring Provider:  [name], MD  Encounter Date: 07/28/2016  Speech Therapy Parkinson's Disease Screen   Decibel Level today: 70.5dB  (WNL=70-72 dB) with sound level meter 30cm away from pt's mouth. This is still WNL. However, pt was counseled she may well benefit from a ST eval for speech loudness at the time of her next screen.  Pt has experienced difficulty in swallowing warranting objective evaluation, stating that she has difficulty with dry foods. More specifically, coughing "When they go past here (pointing to thyroid area)."  Pt would benefit from modified barium swallow eval - if agreed, please order via EPIC or call [phone] to refer and to schedule.    [facility] ,[place], [place]  07/28/2016, 11:45 AM  [facility] [address] [place], [place], [address] Phone: [phone]   Fax:  [phone]

## 2016-07-28 NOTE — Therapy (Signed)
[facility] [address] [place], [place], [address] Phone: [phone]   Fax:  [phone]  Patient Details  Name: Morgan [name] MRN: [id number] Date of Birth: 03-May-1951 Referring Provider:  [name], MD  Encounter Date: 07/28/2016  Physical Therapy Parkinson's Disease Screen   Timed Up and Go test:11.34 sec  10 meter walk test:10.11 sec (3.54 ft/sec)  5 time sit to stand test: 8.35 sec    Patient does not require Physical Therapy services at this time.  Recommend Physical Therapy screen in 6-9 months.    Tashiya Souders W. 07/28/2016, 10:16 AM Frazier Butt., PT [facility] [address] [place], [place], [address] Phone: [phone]   Fax:  [phone]

## 2016-07-28 NOTE — Telephone Encounter (Signed)
-----   Message from [place], DO sent at 07/28/2016 12:39 PM EST ----- Sure if she will go ----- Message ----- From: Annamaria Helling, CMA Sent: 07/28/2016  11:30 AM To: Eustace Quail [name], DO  Let me know if you want me to order.  ----- Message ----- From: Sharen Counter, CCC-SLP Sent: 07/28/2016  11:12 AM To: [place], OT, Amy Minerva Ends, PT, #  Dr. Carles Collet- I saw [place] for a PD screen and she reported dysphagia with dry solids, rare diffiuclty, but I think a MBS would be appropriate at this time. If agreed you can order via EPIC to be done at [facility] or at [facility]. Her volume was lower but still WNL.  #She appeared resistant to therapies in general.# I addressed with her that study after study reiterates that therapies greatly benefit people with PD.  Thanks- Glendell Docker

## 2016-07-30 ENCOUNTER — Other Ambulatory Visit: Payer: Self-pay | Admitting: Neurology

## 2016-08-01 MED ORDER — GABAPENTIN 300 MG PO CAPS: 300.0000 mg | ORAL_CAPSULE | Freq: Three times a day (TID) | ORAL | 1 refills | Status: DC

## 2016-08-01 NOTE — Telephone Encounter (Signed)
Patient called back and states she wants to wait for swallow study due to her busy schedule with the holidays. She will let us know when she is ready.

## 2016-08-10 DIAGNOSIS — H353211 Exudative age-related macular degeneration, right eye, with active choroidal neovascularization: Secondary | ICD-10-CM | POA: Diagnosis not present

## 2016-08-11 DIAGNOSIS — Z0000 Encounter for general adult medical examination without abnormal findings: Secondary | ICD-10-CM | POA: Diagnosis not present

## 2016-08-11 DIAGNOSIS — M797 Fibromyalgia: Secondary | ICD-10-CM | POA: Diagnosis not present

## 2016-08-11 DIAGNOSIS — E559 Vitamin D deficiency, unspecified: Secondary | ICD-10-CM | POA: Diagnosis not present

## 2016-08-11 DIAGNOSIS — G20 Parkinson's disease: Secondary | ICD-10-CM | POA: Diagnosis not present

## 2016-08-11 DIAGNOSIS — N3281 Overactive bladder: Secondary | ICD-10-CM | POA: Diagnosis not present

## 2016-08-11 DIAGNOSIS — I10 Essential (primary) hypertension: Secondary | ICD-10-CM | POA: Diagnosis not present

## 2016-08-11 DIAGNOSIS — E785 Hyperlipidemia, unspecified: Secondary | ICD-10-CM | POA: Diagnosis not present

## 2016-08-11 DIAGNOSIS — Z1389 Encounter for screening for other disorder: Secondary | ICD-10-CM | POA: Diagnosis not present

## 2016-08-12 ENCOUNTER — Other Ambulatory Visit: Payer: Self-pay | Admitting: Neurology

## 2016-08-12 MED ORDER — GABAPENTIN 300 MG PO CAPS: 600.0000 mg | ORAL_CAPSULE | Freq: Three times a day (TID) | ORAL | 1 refills | Status: DC

## 2016-08-12 MED ORDER — PRAMIPEXOLE DIHYDROCHLORIDE ER 0.75 MG PO TB24: ORAL_TABLET | ORAL | 1 refills | Status: DC

## 2016-09-03 ENCOUNTER — Other Ambulatory Visit: Payer: Self-pay | Admitting: Neurology

## 2016-09-08 DIAGNOSIS — H353121 Nonexudative age-related macular degeneration, left eye, early dry stage: Secondary | ICD-10-CM | POA: Diagnosis not present

## 2016-09-08 DIAGNOSIS — H353211 Exudative age-related macular degeneration, right eye, with active choroidal neovascularization: Secondary | ICD-10-CM | POA: Diagnosis not present

## 2016-09-08 DIAGNOSIS — H35372 Puckering of macula, left eye: Secondary | ICD-10-CM | POA: Diagnosis not present

## 2016-10-06 NOTE — Progress Notes (Signed)
Morgan [name] was seen today in the movement disorders clinic for neurologic consultation at the request of Dr. [name] at [facility] internal medicine.  She is accompanied by her husband who helps to supplement the history.  She has previously seen neurology, both Dr. Krista Blue and Dr. Doy Mince.  I reviewed some of these notes.  The consultation is for the evaluation of Parkinson's disease.  The patient is a 66 y.o. right handed female reports that her first sx was "active dreaming."  However, the symptom that got her to the dr was L greater than R hand tremor, only when moving hands/drying hair.  This was in 2010.  She was started on carbidopa/levodopa just as a "trial" for 2 weeks and then it was stopped because it worked.  She was told that it was diagnostic of PD.  She was then started on the Mirapex ER 1.5 mg in 07/2009. and it was worked up to 3.0 mg.  She reports compulsive shopping (she overspends her "allowance).  She mostly internets shops.  She has always shopped but she has previously not gone over the "allowance."  She plays the lottery and plays more than previous.  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  She tried to get off the medication but it was unsuccessful because of worsening of PD.  She was placed on Azilect.  She has been on it for about 2 years.  She has not noted any benefits.    It was her impression that it made the mirapex last longer.  She feels strongly that the computer work and shopping became an issue because of the azilect.   04/19/13 update:  The patient presents today with her husband who supplements the history.  Once she discontinued the Azilect, she felt that her compulsive shopping got better.  Her husband feels that she is not exercising enough, but the patient estimates that she perhaps exercises 4 days per week, but not to the intensity that her husband would like.  She had one near fall since our last visit, but she was on a trail in [place] with 2 walking sticks and one  of them something to the ground and her husband caught her.  She is still taking clonazepam at night.  Her husband thinks that she takes it too early and falls asleep on the couch, but she denies this.  There have not been hallucinations.  There has not been nausea or vomiting.  She has been slower.   08/13/13 update:  The pt is f/u for PD.  She is accompanied by her husband who supplements the hx.  She is on mirapex 3 mg daily and carbidopa/levodopa 25/100 was added last visit.  She notices that she has more energy and can do more things, although her husband states that she does not necessarily do those things (including exercise). She noted that the feet "wobble" since being on the medication.  She also notices that her shopping "addiction" has returned.  She has had no falls.  No hallucinations.  No near syncope.  09/24/13 update:  The pt is f/u regarding PD.  She is accompanied by her husband who helps to supplement the history.  Last visit, I decreased her pramipexole to 2.25 mg daily secondary to compulsive shopping.  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  They both  agree that this could be pathologic.  She is now on carbidopa/levodopa 25/100, half a tablet 4 times per day.  Dyskinesia in the foot is better.  She is just getting over the flu.  No hallucinations.  She has not started exercising faithfully, primarily because she has not been feeling well.  No falls.    12/24/13 update:  The patient is following up in regards to her Parkinson's disease.  She is accompanied by her husband who helps to supplement the history.  Her Mirapex has been reduced to 1.5 mg daily secondary to compulsive behaviors.  She did call and ask if she could increase it, but I did not want to do that.  She is still having compulsive behaviors (shopping) and her husband  would like her to discontinue the medication altogether.  She is currently on carbidopa/levodopa 25/100, half a tablet 4 times per day.  She takes at approximately 8 AM, noon, 4 PM and 9 PM.  Unfortunately, the patient had dyskinesia at higher dosages.  Nonetheless, she states that she would rather have the dyskinesia than feel like she currently does.  She feels slow and achy and just does not feel like doing anything.  She was not having any hallucinations.  She does state that she signed up for a 5K in January and would like to start training for that.  She is not having any hallucinations.  No falls.  She is still on Cymbalta, 60 mg daily as well as clonazepam for insomnia.  01/16/14 update:  The patient is accompanied by her husband who helps to supplement the history.  Last visit, we started the patient on Rytary, and she is currently on 145 mg 3 times a day.  Unfortunately, the patient reports that she had insomnia on the medication and felt "woozy" and really overall felt better on the carbidopa/levodopa IR and would like to go back to that medication, even though she dyskinesia on low dose.  She asks me if she can wean the Mirapex and restart the levodopa.  [facility].  She is planning on working up to running a 5K at [employer] in January, 2016.  She remains on the Cymbalta for her fibromyalgia.  03/20/14 update:  The patient returns today as a work in, accompanied by her husband who supplements the history.  Much has happened since our last visit.  Last visit, I tried to decrease the patient's Mirapex ER to 0.75 mg daily, primarily because of compulsive behaviors.  I started her on levodopa.  She called me back and wanted to go up on the Mirapex because of cramping in the thigh, but instead I increased her levodopa to 4 times a day.  The cramping did get better, but she called later with dyskinesia.  At that time my partner was on  call and she reluctantly increased the patient's Mirapex back to 1.5 mg daily and decreased patient's levodopa to twice a day dosing.  The patient later called me with continued dyskinesias and wanted to go further up on the Mirapex, but I have been resistant because of her compulsive spending in the past.  Instead, on 03/07/2014 I asked her to try amantadine and called in a prescription for this.  Unfortunately, she stated that she read the side effects and decided that she was not going to pick this up and take it.  She is now only on Mirapex 1.5 mg  daily and levodopa 25/100, one tablet in the morning and half tablet at night.  She would like to go back up on the Mirapex and her husband states that if he has to disable the computer to deal with her compulsive shopping then he will do that.  04/21/14 update:  Pt returns today for follow up, accompanied by her husband who supplements the history.  She is back on mirapex, 1.5 mg daily.  She did not do well on the requip and was pretty insistent on going back on the mirapex despite previous convulsive behaviors.  She wants to go off levodopa 25/100, and she is on it 1/2 bid but often forgets the second dose.  She is able to drive again.   She states that she thinks that she needs to go back up to the 3.0 of the mirapex.  Her husband is not now completely convinced that the mirapex caused compulsive shopping but thinks that boredom did that.  They are getting ready to go on a 14 day [employer] cruise at the end of October.   07/29/14 update:  Pt returns for f/u, accompanied by her husband who supplements the history.  She is now up to 3.0 mg of mirapex ER a day.   She states that she doesn't feel as good as she did when she was on it previously, but she is doing better than she was on the previous medications.  She has some trouble getting off of the sofa.  No falls.  Is doing circuit II class for PD as well as [employer] but this only takes up 2 days a  week and admits isn't doing CV work the remainder of the days.  Husband asks about finding a counselor for her anxiety, which is primarily associated with driving.  Her compulsive shopping has not gotten worse with higher dose of mirapex (feels that it is better as they just got off a cruise and she was previously shopping for the cruise)  11/04/14 update:  Pt returns for f/u, accompanied by her husband who supplements the history.  She did the [employer] since our last visit.  She does state that she was the last one in but she was able to finish.  I had got a call from the rehab unit that the patient had wanted to hold therapy because of fibromyalgia but the patient states that she wanted to continue therapy and perhaps she cancelled one day and not the entire therapy.  She asks me to renew the order.  Just a few days ago (2 nights) the patient backed down on the mirapex from 3.75 mg to 3.0 mg due to EDS and she wasn't sure if it was due to  the mirapex so she wanted to try the lower dosage.  She would like to get a scooter and asks me about that today  02/05/15 update:  The patient is accompanied by her husband who supplements the history.  She is on Mirapex ER 3.0 mg daily.  She thinks that she wants to try again potentially going up on it.  She feels a little slower than usual.  She is doing [employer] classes.     She denies any falls.  No hallucinations.  No lightheadedness or near syncope.  No dyskinesia.  She is supposed to be on Cymbalta for both peripheral neuropathy and fibromyalgia.  She ran out and is now not doing well.  She has been out of it for 2 weeks.  The patient thinks that her fibromyalgia got out of control, she quit exercising so vigorously and then her Parkinson's got worse.  She takes clonazepam as needed for insomnia (3-4 days per week).  They're planning several cruises and several vacations in the near future.  05/26/15 update:  The patient is following up today, accompanied by her husband who  supplements the history.  Last visit, I increased her Mirapex to 3.75 mg daily and restarted her Cymbalta for examination of fibromyalgia and peripheral neuropathy.  She thinks that the cymbalta helped but she thinks the increased mirapex caused more tremor.  She also thinks it causes EDS.  She is falling asleep now in the afternoon and having to take the klonopin late at night to help her sleep (1 mg).  She states that doesn't give her hang over effect.  She wants to go back down again to 3.0 mg of the mirapex.    09/03/15 update:  The patient is following up today, accompanied by her husband who supplements the history.  Last visit, the patient wanted to decrease her pramipexole to 3.0 mg daily.  Last visit, we restarted her Sinemet 25/100, half a tablet 3 times per day.  She states that she ended up d/c her pramipexole all together and increased her Sinemet to 1 po qid.  She feels well as long as she doesn't overdo it.  She remains on Cymbalta, 60 mg daily for peripheral neuropathy and fibromyalgia.  She is also on clonazepam 1 mg nightly for REM behavior disorder and insomnia.  She has been faithfully attending Parkinson's therapies at the neuro-rehabilitation center.  She has not had any falls since our last visit.  No hallucinations.  No lightheadedness or near syncope.  12/17/15 update:  The patient returns today, accompanied by her husband who supplements the history.  Last visit, she was on carbidopa/levodopa 25/100, 1 tablet 4 times per day and had taken herself off of pramipexole.  She has put herself back on pramipexole ER 0.75 mg daily and is on carbidopa/levodopa 25/100 3 times per day.  She denies hallucinations.  She had no falls but did trip over a stool in her closet but didn't fall.   She is doing [employer] class one day a week.  She is riding the recumbant bike.  She is scheduled for her assessment next week for PT/OT.    03/10/16 update:  The patient returns today, accompanied by her  husband who supplements the history.  I have reviewed multiple records since our last visit.  She has seen cardiology and was diagnosed with a left bundle branch block and PVCs which were increased by hypokalemia which was from hydrochlorothiazide.  It was recommended that she decrease the dose of the the HCTZ.  She had an echocardiogram on 01/11/2016 was unremarkable with a normal ejection fraction of 50-55%.  She is on pramipexole ER 0.75 mg daily and carbidopa/levodopa 25/100, one tablet 3 times per day.  She is on clonazepam for REM behavior disorder, 1 mg at night.  She has not had falls since our last visit.  She is having "aching" of the legs.  She was taken off of the lipitor by the PCP but it didn't help.  She originally thought that it was her fibromyalgia but she now wonders if it was the addition of levodopa.  No hallucinations.  No lightheadedness or near syncope.  Continues to chew nicotine gum but states that she is trying to cut down on it.  07/07/16 update:  Patient follows up today, accompanied by her husband who supplements the history.  She is on carbidopa/levodopa 25/100, one tablet 3 times per day (7am/2pm/8pm) and pramipexole ER, 0.75 mg daily.  She is also on clonazepam for REM behavior disorder, 1 mg at night.  She had one fall since last visit; she tripped over the dog.  She denies any hallucinations.  She denies lightheadedness or near syncope.  She remains on Cymbalta for her fibromyalgia, but gabapentin was started last visit and she has worked up to 100 mg, 2 po tid.  The gabapentin helped "a little" but not a lot.  The in home PT helped some.  Her and her husband continue to travel significantly via their RV.  10/07/16 update:  Patient follows up today, accompanied by her husband who supplements the history.  Patient remains on carbidopa/levodopa 25/100, one tablet 3 times per day.  She is still on pramipexole ER, 0.75 mg daily.  One fall.  Was practicing a stretch off a chair and she  rolled off of the chair.   Doing well with clonazepam for REM behavior disorder, 1 mg at night.  Last visit I increased her gabapentin to 300 mg 3 times a day in addition to her Cymbalta.  She is on these both for peripheral neuropathy as well as fibromyalgia.  She reports that she went up to 400 mg 3 times per day on the gabapentin and it helped.  She denies hallucinations.  She denies lightheadedness or near syncope.  After seeing speech therapy, a modified barium swallow was recommended, but when I tried to order this, the patient asked me to hold on ordering it.  She still does not think that this is a particular issue and wants to wait.  She is in [employer].     PREVIOUS MEDICATIONS: Mirapex and azilect; levodopa for a few weeks to "prove" benefit; requip  ALLERGIES:   Allergies  Allergen Reactions  . Other Anaphylaxis    "Anti Seizure Medications"  . Sporanox [Itraconazole]   . Tylenol [Acetaminophen]     CURRENT MEDICATIONS:  Current Outpatient Prescriptions on File Prior to Visit  Medication Sig Dispense Refill  . ALPRAZolam (XANAX) 0.25 MG tablet Take 0.25 mg by mouth daily as needed for anxiety.     Marland Kitchen aspirin 325 MG tablet Take 325 mg by mouth daily.    Marland Kitchen atorvastatin (LIPITOR) 40 MG tablet Take 40 mg by mouth daily.    . Calcium-Magnesium-Vitamin D (CALCIUM 500 PO) Take 500 mg by mouth 2 (two) times daily.    . cholecalciferol (VITAMIN D) 1000 units tablet Take 2,000 Units by mouth daily.    . clonazePAM (KLONOPIN) 1 MG tablet TAKE 1 TABLET BY MOUTH AT BEDTIME 90 tablet 1  . Coenzyme Q10 (VITALINE COQ10) 60 MG TABS Take 60 mg by mouth daily.    . CYMBALTA 60 MG capsule TAKE 1 CAPSULE DAILY 90 capsule 3  . gabapentin (NEURONTIN) 300 MG capsule Take 1 capsule (300 mg total) by mouth 3 (three) times daily. 270 capsule 1  . Magnesium 250 MG TABS Take by mouth 2 (two) times daily.    . Multiple Vitamins-Minerals (ICAPS PO) Take 1 capsule by mouth daily.     . Pramipexole  Dihydrochloride 0.75 MG TB24 TAKE 1 TABLET (0.75 MG TOTAL) BY MOUTH DAILY. 90 tablet 1  . raloxifene (EVISTA) 60 MG tablet Take 60 mg by mouth daily.    Marland Kitchen SINEMET 25-100 MG tablet Take  1 tablet by mouth 3 (three) times daily. 270 tablet 3   No current facility-administered medications on file prior to visit.     PAST MEDICAL HISTORY:   Past Medical History:  Diagnosis Date  . Fibromyalgia   . Hypercholesteremia    controlled on medication  . Hypertension   . Melanoma ([place])   . Osteopenia   . Parkinson disease ([place])     PAST SURGICAL HISTORY:   Past Surgical History:  Procedure Laterality Date  . TONSILLECTOMY    . TUBAL LIGATION      SOCIAL HISTORY:   Social History   Social History  . Marital status: Married    Spouse name: N/A  . Number of children: N/A  . Years of education: N/A   Occupational History  . 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 Smoking status: Former Smoker    Quit date: 09/29/1997  . Smokeless tobacco: Never Used     Comment: chews [place], 10-12/day  . Alcohol use Yes     Comment: Occ  . Drug use: No  . Sexual activity: Not on file   Other Topics Concern  . Not on file   Social History Narrative  . No narrative on file    FAMILY HISTORY:   Family Status  Relation Status  . Mother Deceased   colon CA  . Father Alive   healthy  . Brother Alive   healthy  . Sister Alive   4, healthy  . Child Alive   healthy    ROS:  A complete 10 system review of systems was obtained and was unremarkable apart from what is mentioned above.  PHYSICAL EXAMINATION:    VITALS:   Vitals:   10/07/16 1502  BP: 110/70  Pulse: 84  Weight: 179 lb (81.2 kg)  Height: 5\' 5"  (1.651 m)    GEN:  The patient appears stated age and is in NAD. HEENT:  Normocephalic, atraumatic.  The mucous membranes are moist. The superficial temporal arteries are without ropiness or tenderness. CV:  RRR Lungs:  CTAB Neck: No  bruits   Neurological examination:  Orientation: The patient is alert and oriented x3.   Movement examination: Tone: There is normal tone in the UE/LE.   Abnormal movements: There is no tremor.  There is some dyskinesia in the legs Coordination:  There is good RAM's today Gait and Station: The patient has no difficulty arising out of a deep-seated chair without the use of the hands.    When testing gait, she has a fairly normal gait with purpopseful arm swing.    LABS:  Lab Results  Component Value Date   TSH 0.80 01/15/2013   Lab Results  Component Value Date   [id number] 01/15/2013    SPEP/UPEP negative.    Lab Results  Component Value Date   FOLATE 22.1 01/15/2013     ASSESSMENT/PLAN:  1.  Idiopathic akinetic rigid Parkinson's disease.    -We discussed the diagnosis as well as pathophysiology of the disease.  We discussed treatment options as well as prognostic indicators.  Patient education was provided.  -continue on levodopa to 25/100 tid.  She is on mirapex er 0.75 mg daily  -We again talked about the importance of exercise. Congratulated her on going to [employer].  Looks really good and encouraged her to continue 2.  Fibromyalgia  -on cymbalta   -dont think that myalgias are from the addition of levodopa  last September.  Stopping lipitor didn't help.  -continue gabapentin 400 mg 3 times per day, but reminded her not to change her dosing without telling me.  Refilled that today. 3.  probable peripheral neuropathy, idiopathic.  -Back on Cymbalta both for peripheral neuropathy as well as fibromyalgia. 4.  history of melanoma.  -There is some old literature that suggests that the addition of levodopa can increase risk of return melanoma, but new literature suggests that this is not the etiology.   -She is having skin examininations 5.  history of tobacco abuse.  -still chewing nicotine gum and talked again today about d/c that 6.  Insomnia with some RBD  -She is  doing fine with clonazepam.  She is on 1 mg nightly 7.  Minor dysphagia  -Speech therapy recommended modified during swallow, but the patient refuses that for now. 8.  I will see her back in follow-up in the next 3 or 4 months, sooner should new neurologic issues arise.  Much greater than 50% of the 30 minute visit in counseling once again.

## 2016-10-07 ENCOUNTER — Encounter: Payer: Self-pay | Admitting: Neurology

## 2016-10-07 ENCOUNTER — Ambulatory Visit (INDEPENDENT_AMBULATORY_CARE_PROVIDER_SITE_OTHER): Payer: Medicare Other | Admitting: Neurology

## 2016-10-07 VITALS — BP 110/70 | HR 84 | Ht 65.0 in | Wt 179.0 lb

## 2016-10-07 DIAGNOSIS — G249 Dystonia, unspecified: Secondary | ICD-10-CM

## 2016-10-07 DIAGNOSIS — G20 Parkinson's disease: Secondary | ICD-10-CM

## 2016-10-07 DIAGNOSIS — M797 Fibromyalgia: Secondary | ICD-10-CM | POA: Diagnosis not present

## 2016-10-07 DIAGNOSIS — G609 Hereditary and idiopathic neuropathy, unspecified: Secondary | ICD-10-CM

## 2016-10-07 MED ORDER — GABAPENTIN 400 MG PO CAPS: 400.0000 mg | ORAL_CAPSULE | Freq: Three times a day (TID) | ORAL | 1 refills | Status: DC

## 2016-10-19 DIAGNOSIS — H353211 Exudative age-related macular degeneration, right eye, with active choroidal neovascularization: Secondary | ICD-10-CM | POA: Diagnosis not present

## 2016-11-11 ENCOUNTER — Other Ambulatory Visit: Payer: Self-pay | Admitting: Neurology

## 2016-11-11 NOTE — Telephone Encounter (Signed)
Looks like this was written last year for PN but last office note states for Fibromyalgia.  Please advise if you are okay to refill?

## 2016-12-07 DIAGNOSIS — R202 Paresthesia of skin: Secondary | ICD-10-CM | POA: Diagnosis not present

## 2016-12-07 DIAGNOSIS — S46911A Strain of unspecified muscle, fascia and tendon at shoulder and upper arm level, right arm, initial encounter: Secondary | ICD-10-CM | POA: Diagnosis not present

## 2016-12-14 DIAGNOSIS — H353211 Exudative age-related macular degeneration, right eye, with active choroidal neovascularization: Secondary | ICD-10-CM | POA: Diagnosis not present

## 2016-12-23 DIAGNOSIS — H524 Presbyopia: Secondary | ICD-10-CM | POA: Diagnosis not present

## 2016-12-23 DIAGNOSIS — H2513 Age-related nuclear cataract, bilateral: Secondary | ICD-10-CM | POA: Diagnosis not present

## 2017-01-06 ENCOUNTER — Other Ambulatory Visit: Payer: Self-pay | Admitting: Neurology

## 2017-01-19 NOTE — Progress Notes (Signed)
Morgan [name] was seen today in the movement disorders clinic for neurologic consultation at the request of Dr. [name] at [facility] internal medicine.  She is accompanied by her husband who helps to supplement the history.  She has previously seen neurology, both Dr. Krista Blue and Dr. Doy Mince.  I reviewed some of these notes.  The consultation is for the evaluation of Parkinson's disease.  The patient is a 66 y.o. right handed female reports that her first sx was "active dreaming."  However, the symptom that got her to the dr was L greater than R hand tremor, only when moving hands/drying hair.  This was in 2010.  She was started on carbidopa/levodopa just as a "trial" for 2 weeks and then it was stopped because it worked.  She was told that it was diagnostic of PD.  She was then started on the Mirapex ER 1.5 mg in 07/2009. and it was worked up to 3.0 mg.  She reports compulsive shopping (she overspends her "allowance).  She mostly internets shops.  She has always shopped but she has previously not gone over the "allowance."  She plays the lottery and plays more than previous.  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  She tried to get off the medication but it was unsuccessful because of worsening of PD.  She was placed on Azilect.  She has been on it for about 2 years.  She has not noted any benefits.    It was her impression that it made the mirapex last longer.  She feels strongly that the computer work and shopping became an issue because of the azilect.   04/19/13 update:  The patient presents today with her husband who supplements the history.  Once she discontinued the Azilect, she felt that her compulsive shopping got better.  Her husband feels that she is not exercising enough, but the patient estimates that she perhaps exercises 4 days per week, but not to the intensity that her husband would like.  She had one near fall since our last visit, but she was on a trail in [place] with 2 walking sticks and one  of them something to the ground and her husband caught her.  She is still taking clonazepam at night.  Her husband thinks that she takes it too early and falls asleep on the couch, but she denies this.  There have not been hallucinations.  There has not been nausea or vomiting.  She has been slower.   08/13/13 update:  The pt is f/u for PD.  She is accompanied by her husband who supplements the hx.  She is on mirapex 3 mg daily and carbidopa/levodopa 25/100 was added last visit.  She notices that she has more energy and can do more things, although her husband states that she does not necessarily do those things (including exercise). She noted that the feet "wobble" since being on the medication.  She also notices that her shopping "addiction" has returned.  She has had no falls.  No hallucinations.  No near syncope.  09/24/13 update:  The pt is f/u regarding PD.  She is accompanied by her husband who helps to supplement the history.  Last visit, I decreased her pramipexole to 2.25 mg daily secondary to compulsive shopping.  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  They both  agree that this could be pathologic.  She is now on carbidopa/levodopa 25/100, half a tablet 4 times per day.  Dyskinesia in the foot is better.  She is just getting over the flu.  No hallucinations.  She has not started exercising faithfully, primarily because she has not been feeling well.  No falls.    12/24/13 update:  The patient is following up in regards to her Parkinson's disease.  She is accompanied by her husband who helps to supplement the history.  Her Mirapex has been reduced to 1.5 mg daily secondary to compulsive behaviors.  She did call and ask if she could increase it, but I did not want to do that.  She is still having compulsive behaviors (shopping) and her husband  would like her to discontinue the medication altogether.  She is currently on carbidopa/levodopa 25/100, half a tablet 4 times per day.  She takes at approximately 8 AM, noon, 4 PM and 9 PM.  Unfortunately, the patient had dyskinesia at higher dosages.  Nonetheless, she states that she would rather have the dyskinesia than feel like she currently does.  She feels slow and achy and just does not feel like doing anything.  She was not having any hallucinations.  She does state that she signed up for a 5K in January and would like to start training for that.  She is not having any hallucinations.  No falls.  She is still on Cymbalta, 60 mg daily as well as clonazepam for insomnia.  01/16/14 update:  The patient is accompanied by her husband who helps to supplement the history.  Last visit, we started the patient on Rytary, and she is currently on 145 mg 3 times a day.  Unfortunately, the patient reports that she had insomnia on the medication and felt "woozy" and really overall felt better on the carbidopa/levodopa IR and would like to go back to that medication, even though she dyskinesia on low dose.  She asks me if she can wean the Mirapex and restart the levodopa.  [facility].  She is planning on working up to running a 5K at [employer] in January, 2016.  She remains on the Cymbalta for her fibromyalgia.  03/20/14 update:  The patient returns today as a work in, accompanied by her husband who supplements the history.  Much has happened since our last visit.  Last visit, I tried to decrease the patient's Mirapex ER to 0.75 mg daily, primarily because of compulsive behaviors.  I started her on levodopa.  She called me back and wanted to go up on the Mirapex because of cramping in the thigh, but instead I increased her levodopa to 4 times a day.  The cramping did get better, but she called later with dyskinesia.  At that time my partner was on  call and she reluctantly increased the patient's Mirapex back to 1.5 mg daily and decreased patient's levodopa to twice a day dosing.  The patient later called me with continued dyskinesias and wanted to go further up on the Mirapex, but I have been resistant because of her compulsive spending in the past.  Instead, on 03/07/2014 I asked her to try amantadine and called in a prescription for this.  Unfortunately, she stated that she read the side effects and decided that she was not going to pick this up and take it.  She is now only on Mirapex 1.5 mg  daily and levodopa 25/100, one tablet in the morning and half tablet at night.  She would like to go back up on the Mirapex and her husband states that if he has to disable the computer to deal with her compulsive shopping then he will do that.  04/21/14 update:  Pt returns today for follow up, accompanied by her husband who supplements the history.  She is back on mirapex, 1.5 mg daily.  She did not do well on the requip and was pretty insistent on going back on the mirapex despite previous convulsive behaviors.  She wants to go off levodopa 25/100, and she is on it 1/2 bid but often forgets the second dose.  She is able to drive again.   She states that she thinks that she needs to go back up to the 3.0 of the mirapex.  Her husband is not now completely convinced that the mirapex caused compulsive shopping but thinks that boredom did that.  They are getting ready to go on a 14 day [employer] cruise at the end of October.   07/29/14 update:  Pt returns for f/u, accompanied by her husband who supplements the history.  She is now up to 3.0 mg of mirapex ER a day.   She states that she doesn't feel as good as she did when she was on it previously, but she is doing better than she was on the previous medications.  She has some trouble getting off of the sofa.  No falls.  Is doing circuit II class for PD as well as [employer] but this only takes up 2 days a  week and admits isn't doing CV work the remainder of the days.  Husband asks about finding a counselor for her anxiety, which is primarily associated with driving.  Her compulsive shopping has not gotten worse with higher dose of mirapex (feels that it is better as they just got off a cruise and she was previously shopping for the cruise)  11/04/14 update:  Pt returns for f/u, accompanied by her husband who supplements the history.  She did the [employer] since our last visit.  She does state that she was the last one in but she was able to finish.  I had got a call from the rehab unit that the patient had wanted to hold therapy because of fibromyalgia but the patient states that she wanted to continue therapy and perhaps she cancelled one day and not the entire therapy.  She asks me to renew the order.  Just a few days ago (2 nights) the patient backed down on the mirapex from 3.75 mg to 3.0 mg due to EDS and she wasn't sure if it was due to  the mirapex so she wanted to try the lower dosage.  She would like to get a scooter and asks me about that today  02/05/15 update:  The patient is accompanied by her husband who supplements the history.  She is on Mirapex ER 3.0 mg daily.  She thinks that she wants to try again potentially going up on it.  She feels a little slower than usual.  She is doing [employer] classes.     She denies any falls.  No hallucinations.  No lightheadedness or near syncope.  No dyskinesia.  She is supposed to be on Cymbalta for both peripheral neuropathy and fibromyalgia.  She ran out and is now not doing well.  She has been out of it for 2 weeks.  The patient thinks that her fibromyalgia got out of control, she quit exercising so vigorously and then her Parkinson's got worse.  She takes clonazepam as needed for insomnia (3-4 days per week).  They're planning several cruises and several vacations in the near future.  05/26/15 update:  The patient is following up today, accompanied by her husband who  supplements the history.  Last visit, I increased her Mirapex to 3.75 mg daily and restarted her Cymbalta for examination of fibromyalgia and peripheral neuropathy.  She thinks that the cymbalta helped but she thinks the increased mirapex caused more tremor.  She also thinks it causes EDS.  She is falling asleep now in the afternoon and having to take the klonopin late at night to help her sleep (1 mg).  She states that doesn't give her hang over effect.  She wants to go back down again to 3.0 mg of the mirapex.    09/03/15 update:  The patient is following up today, accompanied by her husband who supplements the history.  Last visit, the patient wanted to decrease her pramipexole to 3.0 mg daily.  Last visit, we restarted her Sinemet 25/100, half a tablet 3 times per day.  She states that she ended up d/c her pramipexole all together and increased her Sinemet to 1 po qid.  She feels well as long as she doesn't overdo it.  She remains on Cymbalta, 60 mg daily for peripheral neuropathy and fibromyalgia.  She is also on clonazepam 1 mg nightly for REM behavior disorder and insomnia.  She has been faithfully attending Parkinson's therapies at the neuro-rehabilitation center.  She has not had any falls since our last visit.  No hallucinations.  No lightheadedness or near syncope.  12/17/15 update:  The patient returns today, accompanied by her husband who supplements the history.  Last visit, she was on carbidopa/levodopa 25/100, 1 tablet 4 times per day and had taken herself off of pramipexole.  She has put herself back on pramipexole ER 0.75 mg daily and is on carbidopa/levodopa 25/100 3 times per day.  She denies hallucinations.  She had no falls but did trip over a stool in her closet but didn't fall.   She is doing [employer] class one day a week.  She is riding the recumbant bike.  She is scheduled for her assessment next week for PT/OT.    03/10/16 update:  The patient returns today, accompanied by her  husband who supplements the history.  I have reviewed multiple records since our last visit.  She has seen cardiology and was diagnosed with a left bundle branch block and PVCs which were increased by hypokalemia which was from hydrochlorothiazide.  It was recommended that she decrease the dose of the the HCTZ.  She had an echocardiogram on 01/11/2016 was unremarkable with a normal ejection fraction of 50-55%.  She is on pramipexole ER 0.75 mg daily and carbidopa/levodopa 25/100, one tablet 3 times per day.  She is on clonazepam for REM behavior disorder, 1 mg at night.  She has not had falls since our last visit.  She is having "aching" of the legs.  She was taken off of the lipitor by the PCP but it didn't help.  She originally thought that it was her fibromyalgia but she now wonders if it was the addition of levodopa.  No hallucinations.  No lightheadedness or near syncope.  Continues to chew nicotine gum but states that she is trying to cut down on it.  07/07/16 update:  Patient follows up today, accompanied by her husband who supplements the history.  She is on carbidopa/levodopa 25/100, one tablet 3 times per day (7am/2pm/8pm) and pramipexole ER, 0.75 mg daily.  She is also on clonazepam for REM behavior disorder, 1 mg at night.  She had one fall since last visit; she tripped over the dog.  She denies any hallucinations.  She denies lightheadedness or near syncope.  She remains on Cymbalta for her fibromyalgia, but gabapentin was started last visit and she has worked up to 100 mg, 2 po tid.  The gabapentin helped "a little" but not a lot.  The in home PT helped some.  Her and her husband continue to travel significantly via their RV.  10/07/16 update:  Patient follows up today, accompanied by her husband who supplements the history.  Patient remains on carbidopa/levodopa 25/100, one tablet 3 times per day.  She is still on pramipexole ER, 0.75 mg daily.  One fall.  Was practicing a stretch off a chair and she  rolled off of the chair.   Doing well with clonazepam for REM behavior disorder, 1 mg at night.  Last visit I increased her gabapentin to 300 mg 3 times a day in addition to her Cymbalta.  She is on these both for peripheral neuropathy as well as fibromyalgia.  She reports that she went up to 400 mg 3 times per day on the gabapentin and it helped.  She denies hallucinations.  She denies lightheadedness or near syncope.  After seeing speech therapy, a modified barium swallow was recommended, but when I tried to order this, the patient asked me to hold on ordering it.  She still does not think that this is a particular issue and wants to wait.  She is in [employer].    01/20/17 update:  Patient seen today in follow-up, accompanied by her husband who supplements the history.  The patient is on carbidopa/levodopa 25/100, one tablet but she increased that to 4 times per day in addition to pramipexole ER, 0.75 mg daily.  She has had no sleep attacks.  No hallucinations.  No compulsive behaviors.  One fall when she was squatting in the closet.  Didn't get hurt.  She is on clonazepam for REM behavior disorder, 1 mg at night.  She continues to use gabapentin, 400 mg 3 times a day for peripheral neuropathy as well as her fibromyalgia. C/o pain in the legs.   She is on Cymbalta for her fibromyalgia as well.  She is exercising with rock steady boxing 1-2 times per week.   PREVIOUS MEDICATIONS: Mirapex and azilect; levodopa for a few weeks to "prove" benefit; requip  ALLERGIES:   Allergies  Allergen Reactions  . Other Anaphylaxis    "Anti Seizure Medications"  . Sporanox [Itraconazole]   . Tylenol [Acetaminophen]     CURRENT MEDICATIONS:  Current Outpatient Prescriptions on File Prior to Visit  Medication Sig Dispense Refill  . ALPRAZolam (XANAX) 0.25 MG tablet Take 0.25 mg by mouth daily as needed for anxiety. rarely    . aspirin 325 MG tablet Take 325 mg by mouth daily.    Marland Kitchen atorvastatin (LIPITOR)  40 MG tablet Take 40 mg by mouth daily.    . Calcium-Magnesium-Vitamin D (CALCIUM 500 PO) Take 500 mg by mouth 2 (two) times daily.    . carbidopa-levodopa (SINEMET IR) 25-100 MG tablet TAKE 1 TABLET 3 TIMES A DAY (Patient taking differently: 1 in the morning, 2 in  the afternoon, 1 in the evening) 270 tablet 1  . cholecalciferol (VITAMIN D) 1000 units tablet Take 2,000 Units by mouth daily.    . clonazePAM (KLONOPIN) 1 MG tablet TAKE 1 TABLET BY MOUTH AT BEDTIME 90 tablet 1  . Coenzyme Q10 (VITALINE COQ10) 60 MG TABS Take 60 mg by mouth daily.    . CYMBALTA 60 MG capsule TAKE 1 CAPSULE DAILY 90 capsule 3  . Magnesium 250 MG TABS Take by mouth 2 (two) times daily.    . Multiple Vitamins-Minerals (ICAPS PO) Take 1 capsule by mouth daily.     . Pramipexole Dihydrochloride 0.75 MG TB24 TAKE 1 TABLET (0.75 MG TOTAL) BY MOUTH DAILY. 90 tablet 1  . raloxifene (EVISTA) 60 MG tablet Take 60 mg by mouth daily.     No current facility-administered medications on file prior to visit.     PAST MEDICAL HISTORY:   Past Medical History:  Diagnosis Date  . Fibromyalgia   . Hypercholesteremia    controlled on medication  . Hypertension   . Melanoma ([place])   . Osteopenia   . Parkinson disease ([place])     PAST SURGICAL HISTORY:   Past Surgical History:  Procedure Laterality Date  . TONSILLECTOMY    . TUBAL LIGATION      SOCIAL HISTORY:   Social History   Social History  . Marital status: Married    Spouse name: N/A  . Number of children: N/A  . Years of education: N/A   Occupational History  . 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 Smoking status: Former Smoker    Quit date: 09/29/1997  . Smokeless tobacco: Never Used     Comment: chews [place], 10-12/day  . Alcohol use Yes     Comment: Occ  . Drug use: No  . Sexual activity: Not on file   Other Topics Concern  . Not on file   Social History Narrative  . No narrative on file    FAMILY HISTORY:    Family Status  Relation Status  . Mother Deceased       colon CA  . Father Alive       healthy  . Brother Alive       healthy  . Sister Alive       4, healthy  . Child Alive       healthy    ROS:  A complete 10 system review of systems was obtained and was unremarkable apart from what is mentioned above.  PHYSICAL EXAMINATION:    VITALS:   Vitals:   01/20/17 1441  BP: 122/78  Pulse: 98  SpO2: 97%  Weight: 181 lb (82.1 kg)  Height: '5\' 5"'$  (1.651 m)    GEN:  The patient appears stated age and is in NAD. HEENT:  Normocephalic, atraumatic.  The mucous membranes are moist. The superficial temporal arteries are without ropiness or tenderness. CV:  RRR Lungs:  CTAB Neck: No bruits   Neurological examination:  Orientation: The patient is alert and oriented x3.   Movement examination: Tone: There is normal tone in the UE/LE.   Abnormal movements: There is no tremor.  There is no dyskinesia today Coordination:  There is good RAM's today Gait and Station: The patient has no difficulty arising out of a deep-seated chair without the use of the hands.    When testing gait, she has a fairly normal gait with purpopseful arm swing.  She drags the right leg a little  LABS:  Lab Results  Component Value Date   TSH 0.80 01/15/2013   Lab Results  Component Value Date   XJOITGPQ98 264 01/15/2013    SPEP/UPEP negative.    Lab Results  Component Value Date   FOLATE 22.1 01/15/2013     ASSESSMENT/PLAN:  1.  Idiopathic akinetic rigid Parkinson's disease.    -We discussed the diagnosis as well as pathophysiology of the disease.  We discussed treatment options as well as prognostic indicators.  Patient education was provided.  -continue on levodopa to 25/100 qid.  She is on mirapex er 0.75 mg daily  -We again talked about the importance of exercise. Congratulated her on going to [employer].    -asked about options for private duting nursing and gave brochures and social worker  met with them.   2.  Fibromyalgia  -on cymbalta   -dont think that myalgias are from the addition of levodopa last September.  Stopping lipitor didn't help.  -continue gabapentin 400 mg 3 times per day, but reminded her not to change her dosing without telling me.  Refilled that today.  -told her that dry needling could help and she wants referral for that.  Will send.   3.  probable peripheral neuropathy, idiopathic.  -Back on Cymbalta both for peripheral neuropathy as well as fibromyalgia. 4.  history of melanoma.  -There is some old literature that suggests that the addition of levodopa can increase risk of return melanoma, but new literature suggests that this is not the etiology.   -She is having skin examininations 5.  history of tobacco abuse.  -still chewing nicotine gum and talked again today about d/c that 6.  Insomnia with some RBD  -She is doing fine with clonazepam.  She is on 1 mg nightly 7.  Minor dysphagia  -Speech therapy recommended modified during swallow, but the patient refuses that for now. 8.  I will see her back in follow-up in the next 4-5 months, sooner should new neurologic issues arise.  Much greater than 50% of the 25 minute visit in counseling once again.

## 2017-01-20 ENCOUNTER — Ambulatory Visit (INDEPENDENT_AMBULATORY_CARE_PROVIDER_SITE_OTHER): Payer: Medicare Other | Admitting: Neurology

## 2017-01-20 ENCOUNTER — Encounter: Payer: Self-pay | Admitting: Neurology

## 2017-01-20 VITALS — BP 122/78 | HR 98 | Ht 65.0 in | Wt 181.0 lb

## 2017-01-20 DIAGNOSIS — M797 Fibromyalgia: Secondary | ICD-10-CM

## 2017-01-20 DIAGNOSIS — G20 Parkinson's disease: Secondary | ICD-10-CM | POA: Diagnosis not present

## 2017-01-20 NOTE — Progress Notes (Signed)
Clinical Social Work Note  CSW received request from Dr. Carles Collet to meet with Morgan [name] and Morgan [name] husband to discuss private duty care agency options. CSW met with Morgan [name] and Morgan [name] husband in exam room. CSW introduced self and explained role including including connection to community resources or educational materials, short-term coping/problem-solving counseling, and involvement in PD community. CSW discussed private duty care options and provided Morgan [name] and Morgan [name] husband list of private duty care agencies. Morgan [name] husband discussed that they have utilized private duty care in the past, but weren't satisfied with the company used at that time. CSW discussed that most companies will do a free consult and encouraged Morgan [name] and Morgan [name] husband to consult with a few companies to make a decision. Morgan [name] husband discussed that he has seen someone utilizing [facility] at an exercise class that Morgan [name] attends and CSW provided brochure with more detailed information about their services. CSW clarified questions to best of ability. CSW encouraged Morgan [name] and Morgan [name] husband to contact CSW if any further social work needs arise.  Alison Murray, MSW, LCSW Clinical Social Worker [facility] [phone]

## 2017-01-26 ENCOUNTER — Ambulatory Visit (INDEPENDENT_AMBULATORY_CARE_PROVIDER_SITE_OTHER): Payer: Medicare Other | Admitting: Cardiovascular Disease

## 2017-01-26 ENCOUNTER — Encounter: Payer: Self-pay | Admitting: Cardiovascular Disease

## 2017-01-26 VITALS — BP 108/82 | HR 96 | Ht 65.0 in | Wt 180.6 lb

## 2017-01-26 DIAGNOSIS — I493 Ventricular premature depolarization: Secondary | ICD-10-CM | POA: Diagnosis not present

## 2017-01-26 DIAGNOSIS — I447 Left bundle-branch block, unspecified: Secondary | ICD-10-CM | POA: Diagnosis not present

## 2017-01-26 NOTE — Patient Instructions (Signed)
Dr Croitoru recommends that you schedule a follow-up appointment in 12 months. You will receive a reminder letter in the mail two months in advance. If you don't receive a letter, please call our office to schedule the follow-up appointment.  If you need a refill on your cardiac medications before your next appointment, please call your pharmacy. 

## 2017-01-26 NOTE — Progress Notes (Signed)
Patient ID: Morgan [name], female   DOB: May 05, 1951, 66 y.o.   MRN: [id number]    Cardiology Office Note    Date:  01/27/2017   ID:  Morgan [name], DOB May 23, 1951, MRN [id number]  PCP:  [name], MD  Cardiologist:   Sanda Klein, MD   No chief complaint on file.   History of Present Illness:  Morgan [name] is a 66 y.o. female with recently diagnosed left bundle branch block and irregular heart rhythm referred for cardiac evaluation.  In the year since her last evaluation she has not had any significant cardiac events. She continues to have a variety of aches and pains in her chest that are musculoskeletal in pattern. She is rarely aware of palpitations. She has not had syncope or near syncope. She denies exertional angina or exertional dyspnea. She is primarily limited by Parkinson's disease. She denies leg edema. She does have orthostatic dizziness. She asked whether she needs to continue taking magnesium supplements. She is not taking either diuretics or proton pump inhibitors.  She does not have a history of structural heart disease but does have mild hypertension and hyperlipidemia both adequately treated with pharmacological means. Her most serious medical problem is Parkinson's disease she limits her mobility. Despite this, she tries to remain quite active and has even been on recent trips to [facility]. She has never smoked and does not have a family history of coronary disease. She does not have diabetes mellitus. She uses a recumbent bike 5 days a week for at least the 30 minutes and also goes to special Parkinson's exercises. Her chest discomfort is not brought on or worsened by physical exercise. She has occasional lightheadedness if she bends over and stands up too fast.   Past Medical History:  Diagnosis Date  . Fibromyalgia   . Hypercholesteremia    controlled on medication  . Hypertension   . Melanoma ([place])   . Osteopenia   . Parkinson disease [facility])     Past  Surgical History:  Procedure Laterality Date  . TONSILLECTOMY    . TUBAL LIGATION      Current Medications: Outpatient Medications Prior to Visit  Medication Sig Dispense Refill  . ALPRAZolam (XANAX) 0.25 MG tablet Take 0.25 mg by mouth daily as needed for anxiety. rarely    . aspirin 325 MG tablet Take 325 mg by mouth daily.    Marland Kitchen atorvastatin (LIPITOR) 40 MG tablet Take 40 mg by mouth daily.    . Calcium-Magnesium-Vitamin D (CALCIUM 500 PO) Take 500 mg by mouth 2 (two) times daily.    . carbidopa-levodopa (SINEMET IR) 25-100 MG tablet TAKE 1 TABLET 3 TIMES A DAY (Patient taking differently: 1 in the morning, 2 in the afternoon, 1 in the evening) 270 tablet 1  . cholecalciferol (VITAMIN D) 1000 units tablet Take 2,000 Units by mouth daily.    . clonazePAM (KLONOPIN) 1 MG tablet TAKE 1 TABLET BY MOUTH AT BEDTIME 90 tablet 1  . Coenzyme Q10 (VITALINE COQ10) 60 MG TABS Take 60 mg by mouth daily.    . CYMBALTA 60 MG capsule TAKE 1 CAPSULE DAILY 90 capsule 3  . gabapentin (NEURONTIN) 300 MG capsule Take 300 mg by mouth 3 (three) times daily.    . Magnesium 250 MG TABS Take by mouth 2 (two) times daily.    . Multiple Vitamins-Minerals (ICAPS PO) Take 1 capsule by mouth daily.     . Pramipexole Dihydrochloride 0.75 MG TB24 TAKE 1 TABLET (0.75 MG TOTAL) BY MOUTH  DAILY. 90 tablet 1  . raloxifene (EVISTA) 60 MG tablet Take 60 mg by mouth daily.     No facility-administered medications prior to visit.      Allergies:   Other; Sporanox [itraconazole]; and Tylenol [acetaminophen]   Social History   Social History  . Marital status: Married    Spouse name: N/A  . Number of children: N/A  . Years of education: N/A   Occupational History  . 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 Smoking status: Former Smoker    Quit date: 09/29/1997  . Smokeless tobacco: Never Used     Comment: chews [place], 10-12/day  . Alcohol use Yes     Comment: Occ  . Drug use: No   . Sexual activity: Not Asked   Other Topics Concern  . None   Social History Narrative  . None     Family History:  The patient's family history includes Cancer in her maternal grandmother and mother.   ROS:   Please see the history of present illness.    ROS All other systems reviewed and are negative.   PHYSICAL EXAM:   VS:  BP 108/82   Pulse 96   Ht 5\' 5"  (1.651 m)   Wt 180 lb 9.6 oz (81.9 kg)   BMI 30.05 kg/m    General: Alert, oriented x3, no distress, Well nourished, well developed Head: no evidence of trauma, PERRL, EOMI, no exophtalmos or lid lag, no myxedema, no xanthelasma; normal ears, nose and oropharynx Neck: normal jugular venous pulsations and no hepatojugular reflux; brisk carotid pulses without delay and no carotid bruits Chest: clear to auscultation, no signs of consolidation by percussion or palpation, normal fremitus, symmetrical and full respiratory excursions Cardiovascular: normal position and quality of the apical impulse, regular rhythm, normal first and paradoxically split second heart sounds, no murmurs, rubs or gallops Abdomen: no tenderness or distention, no masses by palpation, no abnormal pulsatility or arterial bruits, normal bowel sounds, no hepatosplenomegaly Extremities: no clubbing, cyanosis or edema; 2+ radial, ulnar and brachial pulses bilaterally; 2+ right femoral, posterior tibial and dorsalis pedis pulses; 2+ left femoral, posterior tibial and dorsalis pedis pulses; no subclavian or femoral bruits Neurological: grossly nonfocal, but sluggish movements consistent with Parkinson's disease diagnosis   Wt Readings from Last 3 Encounters:  01/26/17 180 lb 9.6 oz (81.9 kg)  01/20/17 181 lb (82.1 kg)  10/07/16 179 lb (81.2 kg)      Studies/Labs Reviewed:   EKG:  EKG is ordered today.  Shows sinus rhythm with pre-existing left bundle branch block, unchanged from last year. PVCs are not seen on the current tracing  Recent Labs: No results  found for requested labs within last 8760 hours.   Lipid Panel    Component Value Date/Time   CHOL  09/04/2008 0105    155        ATP III CLASSIFICATION:  <200     mg/dL   Desirable  200-239  mg/dL   Borderline High  >=240    mg/dL   High          TRIG 175 (H) 09/04/2008 0105   HDL 40 09/04/2008 0105   CHOLHDL 3.9 09/04/2008 0105   VLDL 35 09/04/2008 0105   [place]Greg[place]  09/04/2008 0105    80        Total Cholesterol/HDL:CHD Risk Coronary Heart Disease Risk Table  Men   Women  1/2 Average Risk   3.4   3.3  Average Risk       5.0   4.4  2 X Average Risk   9.6   7.1  3 X Average Risk  23.4   11.0        Use the calculated Patient Ratio above and the CHD Risk Table to determine the patient's CHD Risk.        ATP III CLASSIFICATION (LDL):  <100     mg/dL   Optimal  100-129  mg/dL   Near or Above                    Optimal  130-159  mg/dL   Borderline  160-189  mg/dL   High  >190     mg/dL   Very High    Additional studies/ records that were reviewed today include:  Records from primary care provider and the emergency department    ASSESSMENT:    No diagnosis found.   PLAN:  In order of problems listed above:  1. LBBB: She does not have evidence of cardiomyopathy, coronary artery disease or other significant structural problems. Discussed the fact that left bundle branch block may eventually progress to need for pacemaker therapy if she develops AV block. Currently no symptoms suggestive. 2. PVCs: These are no longer present, possibly since she has stopped taking a diuretic. 3. MR: MVP not confirmed on most recent echo. Not audible by physical exam. Has a very mild degree of mitral insufficiency.   Medication Adjustments/Labs and Tests Ordered: Current medicines are reviewed at length with the patient today.  Concerns regarding medicines are outlined above.  Medication changes, Labs and Tests ordered today are listed in the Patient Instructions  below. Patient Instructions  Dr Sallyanne Kuster recommends that you schedule a follow-up appointment in 12 months. You will receive a reminder letter in the mail two months in advance. If you don't receive a letter, please call our office to schedule the follow-up appointment.  If you need a refill on your cardiac medications before your next appointment, please call your pharmacy.    Signed, Sanda Klein, MD  01/27/2017 5:36 PM    [facility] [address], [place], [place]  [address] Phone: [phone]; Fax: [phone]

## 2017-01-30 ENCOUNTER — Encounter: Payer: Self-pay | Admitting: Physical Therapy

## 2017-01-30 ENCOUNTER — Ambulatory Visit: Payer: Medicare Other | Attending: Neurology | Admitting: Physical Therapy

## 2017-01-30 DIAGNOSIS — M79605 Pain in left leg: Secondary | ICD-10-CM

## 2017-01-30 DIAGNOSIS — M79662 Pain in left lower leg: Secondary | ICD-10-CM | POA: Insufficient documentation

## 2017-01-30 DIAGNOSIS — M79622 Pain in left upper arm: Secondary | ICD-10-CM | POA: Diagnosis not present

## 2017-01-30 DIAGNOSIS — R269 Unspecified abnormalities of gait and mobility: Secondary | ICD-10-CM | POA: Diagnosis not present

## 2017-01-30 DIAGNOSIS — M79604 Pain in right leg: Secondary | ICD-10-CM | POA: Insufficient documentation

## 2017-01-30 DIAGNOSIS — R279 Unspecified lack of coordination: Secondary | ICD-10-CM | POA: Insufficient documentation

## 2017-01-30 NOTE — Therapy (Signed)
[facility] [address], [place], [place], [address] Phone: [phone]   Fax:  [phone]  Physical Therapy Evaluation  Patient Details  Name: Morgan [name] MRN: [id number] Date of Birth: April 12, 1951 Referring Provider: Alonza Bogus MD  Encounter Date: 01/30/2017      PT End of Session - 01/30/17 1120    Visit Number 1   Number of Visits 12   Date for PT Re-Evaluation 04/01/17   Authorization Type KX modifier after visit Nov 14, 2022, G-codes every 10th visit   PT Start Time 1100   PT Stop Time 1145   PT Time Calculation (min) 45 min   Activity Tolerance Patient tolerated treatment well   Behavior During Therapy [facility] for tasks assessed/performed      Past Medical History:  Diagnosis Date  . Fibromyalgia   . Hypercholesteremia    controlled on medication  . Hypertension   . Melanoma ([place])   . Osteopenia   . Parkinson disease [facility])     Past Surgical History:  Procedure Laterality Date  . TONSILLECTOMY    . TUBAL LIGATION      There were no vitals filed for this visit.       Subjective Assessment - 01/30/17 1110    Subjective Pt arriving to therapy for new evalaution complaining of 5/10 bilateral leg pain which extends from upper to lower leg. Pt reporting the pain has been going on for at least 6 months. Pt also reporting pain in her right arm of 2/10 at rest which increases with pushing down to a 5/10. Patient reporting injury after a boxing class.  Pt also reporting balance issues where she has LOB when squatting duinrg ADL's./household activities   Pertinent History Parkinson's, fibromalgia   Limitations Walking;Standing;House hold activities   How long can you sit comfortably? unlimited   How long can you stand comfortably? 5 minutes   How long can you walk comfortably? 30 minutes   Currently in Pain? Yes   Pain Score 5    Pain Location Leg   Pain Orientation Right;Left   Pain Descriptors / Indicators  Aching   Pain Type Chronic pain   Pain Onset More than a month ago   Pain Frequency Intermittent   Aggravating Factors  walking, standing,    Pain Relieving Factors sitting, pain patch   Multiple Pain Sites Yes   Pain Score 2   Pain Location Arm   Pain Orientation Right   Pain Descriptors / Indicators Aching;Tingling   Pain Type Acute pain  since injury   Pain Onset 1 to 4 weeks ago   Pain Frequency Intermittent   Aggravating Factors  pushing up when standing   Pain Relieving Factors resting   Effect of Pain on Daily Activities house hold activity difficulties            [facility] PT Assessment - 01/30/17 0001      Assessment   Medical Diagnosis LE pain, R UE pain, Parkinson's   Referring Provider [name], Wells Guiles MD   Hand Dominance Right   Next MD Visit 4 months   Prior Therapy yes, neuro PT      Precautions   Precautions Fall     Restrictions   Weight Bearing Restrictions No     Balance Screen   Has the patient fallen in the past 6 months Yes   How many times? 4   Has the patient had a decrease in activity level because of a fear of falling?  Yes   Is the patient reluctant to leave their home because of a fear of falling?  No     Home Ec[place] residence   Living Arrangements Spouse/significant other;Other relatives   Available Help at Discharge Family   Type of [place] to enter   Entrance Stairs-Number of Steps 1   Entrance Stairs-Rails Right   Home Layout Two level;Able to live on main level with bedroom/bathroom     Prior Function   Level of Independence Needs assistance with ADLs  intermittently with getting socks on   Vocation Retired   Leisure reading, computer games     Cognition   Overall Cognitive Status Within Functional Limits for tasks assessed     Observation/Other Assessments   Focus on Therapeutic Outcomes (FOTO)  Did not perform due to multiple site pain     ROM / Strength   AROM /  PROM / Strength Strength     Strength   Strength Assessment Site Hip   Right/Left Hip Right;Left   Right Hip Flexion 4/5   Right Hip Extension 4/5   Right Hip ABduction 4/5   Right Hip ADduction 4/5   Left Hip Flexion 4/5   Left Hip Extension 4/5   Left Hip ABduction 4/5   Left Hip ADduction 4/5     Balance   Balance Assessed Yes     Standardized Balance Assessment   Standardized Balance Assessment Berg Balance Test     Berg Balance Test   Sit to Stand Able to stand without using hands and stabilize independently   Standing Unsupported Able to stand safely 2 minutes   Sitting with Back Unsupported but Feet Supported on Floor or Stool Able to sit safely and securely 2 minutes   Stand to Sit Sits safely with minimal use of hands   Transfers Able to transfer safely, minor use of hands   Standing Unsupported with Eyes Closed Able to stand 10 seconds safely   Standing Ubsupported with Feet Together Able to place feet together independently and stand 1 minute safely   From Standing, Reach Forward with Outstretched Arm Can reach confidently >25 cm (10")   Turn 360 Degrees Able to turn 360 degrees safely one side only in 4 seconds or less   Standing Unsupported, Alternately Place Feet on Step/Stool Able to stand independently and safely and complete 8 steps in 20 seconds   Standing Unsupported, One Foot in Front Able to place foot tandem independently and hold 30 seconds   Standing on One Leg Able to lift leg independently and hold > 10 seconds            Objective measurements completed on examination: See above findings.          [place] Adult PT Treatment/Exercise - 01/30/17 0001      Ambulation/Gait   Ambulation/Gait Yes   Ambulation/Gait Assistance 6: Modified independent (Device/Increase time)   Ambulation Distance (Feet) 50 Feet   Assistive device Other (Comment)  walking stick   Gait Pattern Step-through pattern;Decreased step length - right;Decreased step length  - left;Decreased dorsiflexion - right;Decreased dorsiflexion - left;Shuffle;Trunk flexed;Narrow base of support;Poor foot clearance - left;Poor foot clearance - right   Ambulation Surface Level;Indoor     Posture/Postural Control   Posture/Postural Control Postural limitations   Postural Limitations Rounded Shoulders;Forward head     Exercises   Exercises Knee/Hip     Knee/Hip Exercises: Stretches   ITB Stretch  3 reps;20 seconds   Piriformis Stretch 3 reps;20 seconds     Manual Therapy   Manual Therapy Soft tissue mobilization   Manual therapy comments STW to bilateral anterior quads and IT bands, trigger points noted in R distal quad and bilateral IT bands   Soft tissue mobilization Trigger point release performed                PT Education - 01/30/17 1119    Education provided Yes   Education Details HEP   Person(s) Educated Patient   Methods Explanation;Demonstration;Verbal cues;Handout   Comprehension Verbalized understanding;Returned demonstration;Verbal cues required          PT Short Term Goals - 01/30/17 1455      PT SHORT TERM GOAL #1   Title Pt will be independent with her HEP.    Time 3   Period Weeks   Status New           PT Long Term Goals - 01/30/17 1458      PT LONG TERM GOAL #1   Title Pt will be independent with HEP to help with pain relief and overall strengthening of bilateral LE's.    Time 6   Period Weeks   Status New     PT LONG TERM GOAL #2   Title Pt will be able to amb > 500 feet with step through gait pattern with no LOB using the least restricitve device.    Time 6   Period Weeks   Status New     PT LONG TERM GOAL #3   Title Pt will be able to report ability to perform ADL's with pain </= 3/10  in order to improve functional mobility.    Time 6   Period Weeks   Status New                Plan - 01/30/17 1121    Clinical Impression Statement Patient arriving to therapy for evalaution for pain in bilateral  LE's and Right UE. Pt reporting pain has increasingly worsened over the last few months. Pt reporting 5/10 pain in bilateral quads at rest and pt reporting pain can extend into her left calf. Pt reporting pain of 2/10 in her R UE at rest. Pt reported that activities make the pain worse which can increase to 7-8/10. Pt with mild weakness noted in bilateral hips grossly 4/5. BERG balance test performed, pt scoring 54/56. Pt amb with a walking stick with decreased foot clearance bilaterally which presents greater on the left. Pt with one LOB while amb into clinic but  was able to control with her walking stick. Skilled PT needed to address pt's impairments with the below interventions.   History and Personal Factors relevant to plan of care: Parkinson's Disease, Fibromyalgia,    Clinical Presentation Stable   Clinical Presentation due to: Pt with increased history of falls and worsening leg pain bilaterally which radiates into calves.    Clinical Decision Making Moderate   Rehab Potential Good   PT Frequency 2x / week   PT Duration 6 weeks   PT Treatment/Interventions ADLs/Self Care Home Management;Moist Heat;Electrical Stimulation;Cryotherapy;Gait training;Stair training;Functional mobility training;Therapeutic activities;Therapeutic exercise;Balance training;Neuromuscular re-education;Dry needling;Patient/family education;Manual techniques;Passive range of motion;Taping;Energy conservation   PT Next Visit Plan Reveiw HEP, LE strengthening, balance, discussed dry needling per MD request for bilateral IT bands, may further assess R UE pain as appropriate   PT Home Exercise Plan IT band, Piriformis stretch   Consulted and Agree with  Plan of Care Patient      Patient will benefit from skilled therapeutic intervention in order to improve the following deficits and impairments:  Abnormal gait, Pain, Decreased activity tolerance, Difficulty walking, Postural dysfunction, Decreased strength, Decreased balance,  Decreased range of motion  Visit Diagnosis: Pain in left leg - Plan: PT plan of care cert/re-cert  Pain in right leg - Plan: PT plan of care cert/re-cert  Pain in left lower leg - Plan: PT plan of care cert/re-cert  Pain in left upper arm - Plan: PT plan of care cert/re-cert  Abnormality of gait - Plan: PT plan of care cert/re-cert  Lack of coordination - Plan: PT plan of care cert/re-cert      G-Codes - 82/42/35 1501    Functional Assessment Tool Used (Outpatient Only) clinical assessment   Functional Limitation Mobility: Walking and moving around   Mobility: Walking and Moving Around Current Status ([id number]) At least 20 percent but less than 40 percent impaired, limited or restricted   Mobility: Walking and Moving Around Goal Status [phone]) At least 20 percent but less than 40 percent impaired, limited or restricted       Problem List Patient Active Problem List   Diagnosis Date Noted  . LBBB (left bundle branch block) 12/25/2015  . PVCs (premature ventricular contractions) 12/25/2015  . Hyperlipidemia 12/25/2015  . Fibromyalgia 01/16/2014  . Cholelithiasis 04/25/2013  . Paralysis agitans ([place]) 01/15/2013  . Unspecified hereditary and idiopathic peripheral neuropathy 01/15/2013    Oretha Caprice, MPT 01/30/2017, 3:12 PM  [facility] [address] [place], [place], [address] Phone: [phone]   Fax:  [phone]  Name: Morgan [name] MRN: [id number] Date of Birth: [birth year]

## 2017-01-30 NOTE — Patient Instructions (Signed)
   Hold 30 seconds  Repeat 3 times on each leg    Cross one leg over the other and pull your knee toward opposite shoulder Hold 30 seconds Repeat 3 times on each LE

## 2017-01-31 ENCOUNTER — Ambulatory Visit: Payer: Medicare Other | Admitting: Physical Therapy

## 2017-02-02 ENCOUNTER — Ambulatory Visit: Payer: Medicare Other | Admitting: Neurology

## 2017-02-08 ENCOUNTER — Encounter: Payer: Medicare Other | Admitting: Physical Therapy

## 2017-02-08 ENCOUNTER — Encounter: Payer: Self-pay | Admitting: Physical Therapy

## 2017-02-08 ENCOUNTER — Ambulatory Visit: Payer: Medicare Other | Admitting: Physical Therapy

## 2017-02-08 DIAGNOSIS — M79662 Pain in left lower leg: Secondary | ICD-10-CM | POA: Diagnosis not present

## 2017-02-08 DIAGNOSIS — H353211 Exudative age-related macular degeneration, right eye, with active choroidal neovascularization: Secondary | ICD-10-CM | POA: Diagnosis not present

## 2017-02-08 DIAGNOSIS — M79604 Pain in right leg: Secondary | ICD-10-CM | POA: Diagnosis not present

## 2017-02-08 DIAGNOSIS — M79605 Pain in left leg: Secondary | ICD-10-CM | POA: Diagnosis not present

## 2017-02-08 DIAGNOSIS — R269 Unspecified abnormalities of gait and mobility: Secondary | ICD-10-CM | POA: Diagnosis not present

## 2017-02-08 DIAGNOSIS — R279 Unspecified lack of coordination: Secondary | ICD-10-CM

## 2017-02-08 DIAGNOSIS — M79622 Pain in left upper arm: Secondary | ICD-10-CM

## 2017-02-08 NOTE — Patient Instructions (Signed)

## 2017-02-08 NOTE — Therapy (Signed)
[facility] [address], [place], [place], [address] Phone: [phone]   Fax:  [phone]  Physical Therapy Treatment  Patient Details  Name: Morgan [name] MRN: [id number] Date of Birth: May 06, 1951 Referring Provider: Alonza Bogus MD  Encounter Date: 02/08/2017      PT End of Session - 02/08/17 1019    Visit Number 2   Number of Visits 12   Date for PT Re-Evaluation 04/01/17   Authorization Type KX modifier after visit 12-01-2022, G-codes every 10th visit   PT Start Time 1019   PT Stop Time 1100   PT Time Calculation (min) 41 min   Activity Tolerance Patient tolerated treatment well   Behavior During Therapy [facility] for tasks assessed/performed      Past Medical History:  Diagnosis Date  . Fibromyalgia   . Hypercholesteremia    controlled on medication  . Hypertension   . Melanoma ([place])   . Osteopenia   . Parkinson disease [facility])     Past Surgical History:  Procedure Laterality Date  . TONSILLECTOMY    . TUBAL LIGATION      There were no vitals filed for this visit.      Subjective Assessment - 02/08/17 1021    Subjective Has been doing the stretches, not noticing a lot of change.   Limitations Walking;Standing;House hold activities   Currently in Pain? Yes   Pain Score 7    Pain Location Leg   Pain Orientation Right;Left   Pain Descriptors / Indicators Aching   Pain Type Chronic pain   Pain Onset More than a month ago   Pain Frequency Intermittent   Aggravating Factors  walking, standing    Pain Relieving Factors sitting, pain patch   Multiple Pain Sites No                         [facility] Adult PT Treatment/Exercise - 02/08/17 0001      Knee/Hip Exercises: Stretches   Active Hamstring Stretch 3 reps;20 seconds   ITB Stretch 3 reps;20 seconds   Piriformis Stretch 3 reps;20 seconds     Manual Therapy   Manual Therapy Soft tissue mobilization   Soft tissue mobilization STM to  left glutes, TFL, quads          Trigger Point Dry Needling - 02/08/17 1049    Consent Given? Yes   Education Handout Provided Yes   Muscles Treated Lower Body Gluteus minimus;Gluteus maximus;Piriformis;Tensor fascia lata;Quadriceps   Gluteus Maximus Response Twitch response elicited;Palpable increased muscle length   Gluteus Minimus Response Palpable increased muscle length;Twitch response elicited   Piriformis Response Twitch response elicited;Palpable increased muscle length   Tensor Fascia Lata Response Twitch response elicited;Palpable increased muscle length   Quadriceps Response Twitch response elicited;Palpable increased muscle length              PT Education - 02/08/17 1051    Education provided Yes   Education Details dry needling info   Person(s) Educated Patient   Methods Explanation;Handout   Comprehension Verbalized understanding          PT Short Term Goals - 02/08/17 1048      PT SHORT TERM GOAL #1   Title Pt will be independent with her HEP.    Time 3   Period Weeks   Status On-going           PT Long Term Goals - 02/08/17 1048  PT LONG TERM GOAL #2   Title Pt will be able to amb > 500 feet with step through gait pattern with no LOB using the least restricitve device.    Time 6   Period Weeks   Status On-going               Plan - 02/08/17 1031    Clinical Impression Statement Patient was educated and consented to perform dry needling.  Pt had good response with increased tissue length and reports feeling better on left LE.  Only dry needled to left LE for comparison but will need bilateral LE done next session if she had good results.    PT Treatment/Interventions ADLs/Self Care Home Management;Moist Heat;Electrical Stimulation;Cryotherapy;Gait training;Stair training;Functional mobility training;Therapeutic activities;Therapeutic exercise;Balance training;Neuromuscular re-education;Dry needling;Patient/family education;Manual  techniques;Passive range of motion;Taping;Energy conservation   PT Next Visit Plan progress stretches, LE strengthening, f/u on response to dry needling #1 Lt LE and perform to Rt quads, and glutes   Consulted and Agree with Plan of Care Patient      Patient will benefit from skilled therapeutic intervention in order to improve the following deficits and impairments:  Abnormal gait, Pain, Decreased activity tolerance, Difficulty walking, Postural dysfunction, Decreased strength, Decreased balance, Decreased range of motion  Visit Diagnosis: Pain in left leg  Pain in right leg  Pain in left lower leg  Pain in left upper arm  Abnormality of gait  Lack of coordination     Problem List Patient Active Problem List   Diagnosis Date Noted  . LBBB (left bundle branch block) 12/25/2015  . PVCs (premature ventricular contractions) 12/25/2015  . Hyperlipidemia 12/25/2015  . Fibromyalgia 01/16/2014  . Cholelithiasis 04/25/2013  . Paralysis agitans ([place]) 01/15/2013  . Unspecified hereditary and idiopathic peripheral neuropathy 01/15/2013    Zannie Cove, PT 02/08/2017, 11:50 AM  [facility] [address], [place], [place], [address] Phone: [phone]   Fax:  [phone]  Name: Morgan [name] MRN: [id number] Date of Birth: 1951/08/29

## 2017-02-09 DIAGNOSIS — F419 Anxiety disorder, unspecified: Secondary | ICD-10-CM | POA: Diagnosis not present

## 2017-02-09 DIAGNOSIS — M797 Fibromyalgia: Secondary | ICD-10-CM | POA: Diagnosis not present

## 2017-02-09 DIAGNOSIS — E785 Hyperlipidemia, unspecified: Secondary | ICD-10-CM | POA: Diagnosis not present

## 2017-02-09 DIAGNOSIS — M8580 Other specified disorders of bone density and structure, unspecified site: Secondary | ICD-10-CM | POA: Diagnosis not present

## 2017-02-09 DIAGNOSIS — I10 Essential (primary) hypertension: Secondary | ICD-10-CM | POA: Diagnosis not present

## 2017-02-09 DIAGNOSIS — E559 Vitamin D deficiency, unspecified: Secondary | ICD-10-CM | POA: Diagnosis not present

## 2017-02-09 DIAGNOSIS — G20 Parkinson's disease: Secondary | ICD-10-CM | POA: Diagnosis not present

## 2017-02-10 ENCOUNTER — Ambulatory Visit: Payer: Medicare Other | Admitting: Physical Therapy

## 2017-02-10 DIAGNOSIS — R269 Unspecified abnormalities of gait and mobility: Secondary | ICD-10-CM | POA: Diagnosis not present

## 2017-02-10 DIAGNOSIS — R279 Unspecified lack of coordination: Secondary | ICD-10-CM | POA: Diagnosis not present

## 2017-02-10 DIAGNOSIS — M79604 Pain in right leg: Secondary | ICD-10-CM | POA: Diagnosis not present

## 2017-02-10 DIAGNOSIS — M79605 Pain in left leg: Secondary | ICD-10-CM | POA: Diagnosis not present

## 2017-02-10 DIAGNOSIS — M79662 Pain in left lower leg: Secondary | ICD-10-CM | POA: Diagnosis not present

## 2017-02-10 DIAGNOSIS — M79622 Pain in left upper arm: Secondary | ICD-10-CM | POA: Diagnosis not present

## 2017-02-10 NOTE — Therapy (Signed)
[facility] [address] [place], [place], [address] Phone: [phone]   Fax:  [phone]  Physical Therapy Treatment  Patient Details  Name: Morgan [name] MRN: [id number] Date of Birth: 1951-05-16 Referring Provider: Alonza Bogus MD  Encounter Date: 02/10/2017      PT End of Session - 02/10/17 1112    Visit Number 3   Number of Visits 12   Date for PT Re-Evaluation 04/01/17   Authorization Type KX modifier after visit 2022-11-28, G-codes every 10th visit   PT Start Time 1015   PT Stop Time 1107   PT Time Calculation (min) 52 min   Activity Tolerance Patient tolerated treatment well   Behavior During Therapy [facility] for tasks assessed/performed      Past Medical History:  Diagnosis Date  . Fibromyalgia   . Hypercholesteremia    controlled on medication  . Hypertension   . Melanoma ([place])   . Osteopenia   . Parkinson disease [facility])     Past Surgical History:  Procedure Laterality Date  . TONSILLECTOMY    . TUBAL LIGATION      There were no vitals filed for this visit.      Subjective Assessment - 02/10/17 1018    Subjective felt like the TDN helped.  having pain in RUE-thinks she may have injured it while boxing   Currently in Pain? Yes   Pain Score 4    Pain Location Leg   Pain Orientation Left;Right   Pain Descriptors / Indicators Aching   Pain Type Chronic pain   Pain Onset More than a month ago   Pain Frequency Intermittent   Aggravating Factors  walking, standing   Pain Relieving Factors sitting, pain patch                         [facility] Adult PT Treatment/Exercise - 02/10/17 1021      Knee/Hip Exercises: Stretches   Sports administrator Left;3 reps;30 seconds   Quad Stretch Limitations supine   ITB Stretch Both;3 reps;30 seconds   Piriformis Stretch Both;3 reps;30 seconds     Knee/Hip Exercises: Aerobic   Stationary Bike L2 x 6 min     Manual Therapy   Manual Therapy Soft  tissue mobilization   Soft tissue mobilization Lt quads and Rt glutes          Trigger Point Dry Needling - 02/10/17 1112    Consent Given? Yes   Muscles Treated Lower Body Gluteus minimus;Quadriceps   Gluteus Minimus Response Twitch response elicited;Palpable increased muscle length  Rt only   Quadriceps Response Twitch response elicited;Palpable increased muscle length  Lt quad                PT Short Term Goals - 02/08/17 1048      PT SHORT TERM GOAL #1   Title Pt will be independent with her HEP.    Time 3   Period Weeks   Status On-going           PT Long Term Goals - 02/08/17 1048      PT LONG TERM GOAL #2   Title Pt will be able to amb > 500 feet with step through gait pattern with no LOB using the least restricitve device.    Time 6   Period Weeks   Status On-going               Plan - 02/10/17 1113  Clinical Impression Statement Dry needling performed today to Lt quads and Rt glute med/max as pt states previous session was Rt quad and Lt glutes.  Pt with twitch response in all muscles today with good results from last session as well.  Following session, upon standing up pt became lightheaded.  Assisted to lobby and pt's husband brought her water.  After sitting for ~ 5 min symptoms resolved and pt able to amb safely to car with husband.  Declined further assistance.     PT Next Visit Plan progress stretches, LE strengthening, f/u on response to dry needling; address RUE PRN   Consulted and Agree with Plan of Care Patient      Patient will benefit from skilled therapeutic intervention in order to improve the following deficits and impairments:  Abnormal gait, Pain, Decreased activity tolerance, Difficulty walking, Postural dysfunction, Decreased strength, Decreased balance, Decreased range of motion  Visit Diagnosis: Pain in left leg  Pain in right leg     Problem List Patient Active Problem List   Diagnosis Date Noted  . LBBB  (left bundle branch block) 12/25/2015  . PVCs (premature ventricular contractions) 12/25/2015  . Hyperlipidemia 12/25/2015  . Fibromyalgia 01/16/2014  . Cholelithiasis 04/25/2013  . Paralysis agitans ([place]) 01/15/2013  . Unspecified hereditary and idiopathic peripheral neuropathy 01/15/2013      Laureen Abrahams, PT, DPT 02/10/17 11:17 AM    [facility] [address] [place], [place], [address] Phone: [phone]   Fax:  [phone]  Name: Samanatha Brammer MRN: [id number] Date of Birth: 1950-12-18

## 2017-02-10 NOTE — Patient Instructions (Signed)
Quads / HF, Supine    Lie near edge of bed, one leg bent, foot flat on bed. Other leg hanging over edge, relaxed, thigh resting entirely on bed. Bend hanging knee backward keeping thigh in contact with bed. Hold __30_ seconds.  Repeat _3__ times per session. Do _2-3__ sessions per day.  Copyright  VHI. All rights reserved.

## 2017-02-15 ENCOUNTER — Ambulatory Visit: Payer: Medicare Other | Admitting: Physical Therapy

## 2017-02-15 DIAGNOSIS — M79622 Pain in left upper arm: Secondary | ICD-10-CM | POA: Diagnosis not present

## 2017-02-15 DIAGNOSIS — R279 Unspecified lack of coordination: Secondary | ICD-10-CM | POA: Diagnosis not present

## 2017-02-15 DIAGNOSIS — R269 Unspecified abnormalities of gait and mobility: Secondary | ICD-10-CM | POA: Diagnosis not present

## 2017-02-15 DIAGNOSIS — M79605 Pain in left leg: Secondary | ICD-10-CM

## 2017-02-15 DIAGNOSIS — M79604 Pain in right leg: Secondary | ICD-10-CM

## 2017-02-15 DIAGNOSIS — M79662 Pain in left lower leg: Secondary | ICD-10-CM | POA: Diagnosis not present

## 2017-02-15 NOTE — Therapy (Signed)
[facility] [address], [place], [place], [address] Phone: [phone]   Fax:  [phone]  Physical Therapy Treatment  Patient Details  Name: Morgan [name] MRN: [id number] Date of Birth: 12-10-1950 Referring Provider: Alonza Bogus MD  Encounter Date: 02/15/2017      PT End of Session - 02/15/17 1142    Visit Number 4   Number of Visits 12   Date for PT Re-Evaluation 04/01/17   Authorization Type KX modifier after visit 11/23/22, G-codes every 10th visit   PT Start Time 1101   PT Stop Time 1144   PT Time Calculation (min) 43 min   Activity Tolerance Patient tolerated treatment well   Behavior During Therapy [facility] for tasks assessed/performed      Past Medical History:  Diagnosis Date  . Fibromyalgia   . Hypercholesteremia    controlled on medication  . Hypertension   . Melanoma ([place])   . Osteopenia   . Parkinson disease [facility])     Past Surgical History:  Procedure Laterality Date  . TONSILLECTOMY    . TUBAL LIGATION      There were no vitals filed for this visit.      Subjective Assessment - 02/15/17 1139    Subjective I had a lot of soreness after DN last time.  My glutes are okay, but my quads feel very tight today.     Pertinent History Parkinson's, fibromalgia   Limitations Walking;Standing;House hold activities   Currently in Pain? Yes   Pain Score 8    Pain Location Leg   Pain Orientation Right;Left   Pain Descriptors / Indicators Aching   Pain Type Chronic pain   Pain Onset More than a month ago   Pain Frequency Intermittent   Aggravating Factors  walking   Pain Relieving Factors sitting, icy hot   Effect of Pain on Daily Activities walking   Multiple Pain Sites No                         [facility] Adult PT Treatment/Exercise - 02/15/17 0001      Knee/Hip Exercises: Aerobic   Stationary Bike L1 x 10 min     Knee/Hip Exercises: Seated   Long Arc Quad  Strengthening;Both;10 reps     Knee/Hip Exercises: Supine   Quad Sets Strengthening;Right;Left;5 sets;10 reps     Manual Therapy   Manual Therapy Soft tissue mobilization   Soft tissue mobilization bilateral quads, IT bands, TFL          Trigger Point Dry Needling - 02/15/17 1113    Consent Given? Yes   Muscles Treated Lower Body Quadriceps;Tensor fascia lata   Tensor Fascia Lata Response Twitch response elicited;Palpable increased muscle length   Quadriceps Response Twitch response elicited;Palpable increased muscle length                PT Short Term Goals - 02/08/17 1048      PT SHORT TERM GOAL #1   Title Pt will be independent with her HEP.    Time 3   Period Weeks   Status On-going           PT Long Term Goals - 02/08/17 1048      PT LONG TERM GOAL #2   Title Pt will be able to amb > 500 feet with step through gait pattern with no LOB using the least restricitve device.    Time 6   Period Weeks  Status On-going               Plan - 02/15/17 1346    Clinical Impression Statement Pt did not have adverse effects from dry needling today . Reports feeling a little better after treatment . Pt was educated in performing quad sets and LAQ to increase quad muscle activation.  Pt continues to need skilled PT for improved muscle function during gait.   Rehab Potential Good   PT Treatment/Interventions ADLs/Self Care Home Management;Moist Heat;Electrical Stimulation;Cryotherapy;Gait training;Stair training;Functional mobility training;Therapeutic activities;Therapeutic exercise;Balance training;Neuromuscular re-education;Dry needling;Patient/family education;Manual techniques;Passive range of motion;Taping;Energy conservation   PT Next Visit Plan progress stretches, LE strengthening, f/u on response to dry needling#3; address RUE PRN   Consulted and Agree with Plan of Care Patient      Patient will benefit from skilled therapeutic intervention in order to  improve the following deficits and impairments:  Abnormal gait, Pain, Decreased activity tolerance, Difficulty walking, Postural dysfunction, Decreased strength, Decreased balance, Decreased range of motion  Visit Diagnosis: Pain in left leg  Pain in right leg     Problem List Patient Active Problem List   Diagnosis Date Noted  . LBBB (left bundle branch block) 12/25/2015  . PVCs (premature ventricular contractions) 12/25/2015  . Hyperlipidemia 12/25/2015  . Fibromyalgia 01/16/2014  . Cholelithiasis 04/25/2013  . Paralysis agitans ([place]) 01/15/2013  . Unspecified hereditary and idiopathic peripheral neuropathy 01/15/2013    Zannie Cove, PT 02/15/2017, 1:51 PM  [facility] [address] [place], [place], [address] Phone: [phone]   Fax:  [phone]  Name: Lisbeth Puller MRN: [id number] Date of Birth: 1951-05-29

## 2017-02-17 ENCOUNTER — Encounter: Payer: Self-pay | Admitting: Physical Therapy

## 2017-02-17 ENCOUNTER — Ambulatory Visit: Payer: Medicare Other | Admitting: Physical Therapy

## 2017-02-17 ENCOUNTER — Ambulatory Visit: Payer: Medicare Other | Admitting: Neurology

## 2017-02-17 DIAGNOSIS — M79622 Pain in left upper arm: Secondary | ICD-10-CM

## 2017-02-17 DIAGNOSIS — R279 Unspecified lack of coordination: Secondary | ICD-10-CM | POA: Diagnosis not present

## 2017-02-17 DIAGNOSIS — M79604 Pain in right leg: Secondary | ICD-10-CM | POA: Diagnosis not present

## 2017-02-17 DIAGNOSIS — M79662 Pain in left lower leg: Secondary | ICD-10-CM | POA: Diagnosis not present

## 2017-02-17 DIAGNOSIS — M79605 Pain in left leg: Secondary | ICD-10-CM | POA: Diagnosis not present

## 2017-02-17 DIAGNOSIS — R269 Unspecified abnormalities of gait and mobility: Secondary | ICD-10-CM | POA: Diagnosis not present

## 2017-02-17 NOTE — Therapy (Signed)
[facility] [address], [place], [place], [address] Phone: [phone]   Fax:  [phone]  Physical Therapy Treatment  Patient Details  Name: Morgan [name] MRN: [id number] Date of Birth: [birth year] Referring Provider: Alonza Bogus MD  Encounter Date: 02/17/2017      PT End of Session - 02/17/17 1058    Visit Number 5   Number of Visits 12   Date for PT Re-Evaluation 04/01/17   Authorization Type KX modifier after visit 2022/11/18, G-codes every 10th visit   PT Start Time 1058   PT Stop Time 1146   PT Time Calculation (min) 48 min   Activity Tolerance Patient tolerated treatment well   Behavior During Therapy [facility] for tasks assessed/performed      Past Medical History:  Diagnosis Date  . Fibromyalgia   . Hypercholesteremia    controlled on medication  . Hypertension   . Melanoma ([place])   . Osteopenia   . Parkinson disease [facility]     Past Surgical History:  Procedure Laterality Date  . TONSILLECTOMY    . TUBAL LIGATION      There were no vitals filed for this visit.      Subjective Assessment - 02/17/17 1103    Subjective Not as sore after last dry needling session. I did trip over something the other day and had a fall from that. My knees are sore from that. fall.    Pertinent History Parkinson's, fibromalgia   Limitations Walking;Standing;House hold activities   Currently in Pain? Yes  Knees are sore from fall   Pain Score 2    Pain Location Leg   Pain Orientation Right;Left                         [facility] Adult PT Treatment/Exercise - 02/17/17 0001      Knee/Hip Exercises: Aerobic   Nustep L1 x 10 min   PTA present     Knee/Hip Exercises: Standing   Other Standing Knee Exercises Review of standing Parkinson HEP ex given from first PT.     Knee/Hip Exercises: Seated   Long Arc Quad Strengthening;Both;10 reps   Sit to [employer] --  5x     Shoulder Exercises: Seated   Other  Seated Exercises Hands clasped flexion 10x 2  Added to HEP     Shoulder Exercises: Pulleys   Flexion 2 minutes     Manual Therapy   Manual Therapy Soft tissue mobilization   Soft tissue mobilization RT cervical/shoulder /upper trap   Kinesiotex Inhibit Muscle     Kinesiotix   Inhibit Muscle  RT upper trap/scalene  Pt verbally understood wear time, skin care     Ankle Exercises: Seated   Other Seated Ankle Exercises Slant board PF/DF x 56min  VC to maintain smooth, controlled movement                PT Education - 02/17/17 1124    Education provided Yes   Education Details AAROM for RT shoulder flexion and abduction   Person(s) Educated Patient   Methods Explanation;Demonstration;Tactile cues;Verbal cues;Handout   Comprehension Verbalized understanding;Returned demonstration          PT Short Term Goals - 02/17/17 1108      PT SHORT TERM GOAL #1   Title Pt will be independent with her HEP.    Time 3   Period Weeks   Status Achieved  PT Long Term Goals - 02/08/17 1048      PT LONG TERM GOAL #2   Title Pt will be able to amb > 500 feet with step through gait pattern with no LOB using the least restricitve device.    Time 6   Period Weeks   Status On-going               Plan - 02/17/17 1105    Clinical Impression Statement Pt is currently compliant with her HEP from her initial PT with the Parkinson program. She has recently had a fall from "tripping " over something.  We worked on Rt arm/shoulder pain today, giving HEP and trying some KTape to inhibit the upper trap.    Rehab Potential Good   PT Frequency 2x / week   PT Duration 6 weeks   PT Treatment/Interventions ADLs/Self Care Home Management;Moist Heat;Electrical Stimulation;Cryotherapy;Gait training;Stair training;Functional mobility training;Therapeutic activities;Therapeutic exercise;Balance training;Neuromuscular re-education;Dry needling;Patient/family education;Manual  techniques;Passive range of motion;Taping;Energy conservation   PT Next Visit Plan progress stretches, LE strengthening, see if tape/soft tissue work effected the arm/shoulder pain.   Consulted and Agree with Plan of Care --      Patient will benefit from skilled therapeutic intervention in order to improve the following deficits and impairments:  Abnormal gait, Pain, Decreased activity tolerance, Difficulty walking, Postural dysfunction, Decreased strength, Decreased balance, Decreased range of motion  Visit Diagnosis: Pain in left leg  Pain in right leg  Pain in left upper arm     Problem List Patient Active Problem List   Diagnosis Date Noted  . LBBB (left bundle branch block) 12/25/2015  . PVCs (premature ventricular contractions) 12/25/2015  . Hyperlipidemia 12/25/2015  . Fibromyalgia 01/16/2014  . Cholelithiasis 04/25/2013  . Paralysis agitans ([place]) 01/15/2013  . Unspecified hereditary and idiopathic peripheral neuropathy 01/15/2013    Morgan [name], PTA 02/17/2017, 11:59 AM  [facility] [address] [place], [place], [address] Phone: [phone]   Fax:  [phone]  Name: Morgan [name] MRN: [id number] Date of Birth: [birth year]

## 2017-02-17 NOTE — Patient Instructions (Signed)
SELF ASSISTED WITH OBJECT: Shoulder Flexion - Sitting    Hold cane with both hands. Raise arms up. Do 10x  __2 sets, _1-3 x day. Copyright  VHI. All rights reserved.     #2 Then flip your RT hand to thumbs up, and use the LT hand to push the right shoulder outwards/on an angle. Do 2x 10, try 1-3xday.

## 2017-02-19 ENCOUNTER — Other Ambulatory Visit: Payer: Self-pay | Admitting: Neurology

## 2017-02-20 ENCOUNTER — Telehealth: Payer: Self-pay | Admitting: Neurology

## 2017-02-20 MED ORDER — CLONAZEPAM 1 MG PO TABS: 1.0000 mg | ORAL_TABLET | Freq: Every day | ORAL | 1 refills | Status: DC

## 2017-02-20 NOTE — Telephone Encounter (Signed)
PT called and [facility] would not refill her Clonazepam without an appointment first which she has one in September and she wants to know if she can get enough to get her to her September appointment

## 2017-02-20 NOTE — Telephone Encounter (Signed)
RX called to [facility] on [address].

## 2017-02-21 ENCOUNTER — Ambulatory Visit: Payer: Medicare Other | Attending: Family Medicine | Admitting: Physical Therapy

## 2017-02-21 ENCOUNTER — Ambulatory Visit: Payer: Medicare Other | Admitting: Occupational Therapy

## 2017-02-21 ENCOUNTER — Ambulatory Visit: Payer: Medicare Other

## 2017-02-21 DIAGNOSIS — R29818 Other symptoms and signs involving the nervous system: Secondary | ICD-10-CM | POA: Insufficient documentation

## 2017-02-21 DIAGNOSIS — R2689 Other abnormalities of gait and mobility: Secondary | ICD-10-CM

## 2017-02-21 NOTE — Therapy (Signed)
[facility] [address] [place], [place], [address] Phone: [phone]   Fax:  [phone]  Patient Details  Name: Morgan [name] MRN: [id number] Date of Birth: October 09, 1950 Referring Provider:  [name], MD  Encounter Date: 02/21/2017  Physical Therapy Parkinson's Disease Screen   Timed Up and Go test:11.75 sec  10 meter walk test:10.25 sec = 3.2 ft/sec (compared to 3.54 ft/sec)  5 time sit to stand test:9.12 sec (compared to 8.35 sec)  Pt does report 3 falls in the past 6 months (several of which pt reports occurred while squatting to floor), but is currently being seen by physical therapy at [facility] to address bilateral leg pain. Will likely contact therapist at [facility] about possibility of addressing strength, fall prevention.  Patient does not require Physical Therapy to address Parkinson's related issues at this time.  Recommend Physical Therapy screen in 6-9 months, or sooner if symptoms warrant.    MARRIOTT,AMY W. 02/21/2017, 1:36 PM  Frazier Butt., PT  [facility] [address] [place], [place], [address] Phone: [phone]   Fax:  [phone]

## 2017-02-21 NOTE — Therapy (Signed)
[facility] [address] [place], [place], [address] Phone: [phone]   Fax:  [phone]  Patient Details  Name: Morgan [name] MRN: [id number] Date of Birth: 03/18/1951 Referring Provider:  [name], MD  Encounter Date: 02/21/2017  Occupational Therapy Parkinson's Disease Screen  Hand dominance:  right   Physical Performance Test item #4 (donning/doffing jacket):  17.54 sec (no associated trunk movements noted), pain with R shoulder movement noted  9-hole peg test:    RUE  22.22 sec        LUE  29.13 sec  Box & Blocks Test:   RUE  44 blocks        LUE  43 blocks  Change in ability to perform ADLs/IADLs:  difficulty with L sock   Other Comments:  Pt reports that she injured her R shoulder at [facility] and is having pain.  Pt noted to shuffle and catch toes multiple times when walking with walking stick.   Pt would benefit from occupational therapy evaluation due to slowing of coordination scores, RUE pain, and incr bradykinesia noted with functional movement.    [facility] 02/21/2017, 1:20 PM  [facility] [address] [place], [place], [address] Phone: [phone]   Fax:  [place], OTR/L [facility] [address] [place], [place]  [address] [phone] phone [phone] 02/21/17 1:20 PM

## 2017-02-22 ENCOUNTER — Ambulatory Visit: Payer: Medicare Other | Admitting: Physical Therapy

## 2017-02-22 DIAGNOSIS — R269 Unspecified abnormalities of gait and mobility: Secondary | ICD-10-CM | POA: Diagnosis not present

## 2017-02-22 DIAGNOSIS — M79622 Pain in left upper arm: Secondary | ICD-10-CM | POA: Diagnosis not present

## 2017-02-22 DIAGNOSIS — M79605 Pain in left leg: Secondary | ICD-10-CM

## 2017-02-22 DIAGNOSIS — M79662 Pain in left lower leg: Secondary | ICD-10-CM | POA: Diagnosis not present

## 2017-02-22 DIAGNOSIS — M79604 Pain in right leg: Secondary | ICD-10-CM | POA: Diagnosis not present

## 2017-02-22 DIAGNOSIS — R279 Unspecified lack of coordination: Secondary | ICD-10-CM | POA: Diagnosis not present

## 2017-02-22 NOTE — Therapy (Signed)
[facility] [address], [place], [place], [address] Phone: [phone]   Fax:  [phone]  Physical Therapy Treatment  Patient Details  Name: Morgan [name] MRN: [id number] Date of Birth: Jun 14, 1951 Referring Provider: Alonza Bogus MD  Encounter Date: 02/22/2017      PT End of Session - 02/22/17 1142    Visit Number 6   Number of Visits 12   Date for PT Re-Evaluation 04/01/17   Authorization Type KX modifier after visit 12-07-22, G-codes every 10th visit   PT Start Time 1102   PT Stop Time 1145   PT Time Calculation (min) 43 min   Activity Tolerance Patient tolerated treatment well   Behavior During Therapy [facility] for tasks assessed/performed      Past Medical History:  Diagnosis Date  . Fibromyalgia   . Hypercholesteremia    controlled on medication  . Hypertension   . Melanoma ([place])   . Osteopenia   . Parkinson disease [facility])     Past Surgical History:  Procedure Laterality Date  . TONSILLECTOMY    . TUBAL LIGATION      There were no vitals filed for this visit.      Subjective Assessment - 02/22/17 1107    Subjective The tape helped a lot last time, my legs feel better and I like I need to work on my posture.   Pertinent History Parkinson's, fibromalgia   Limitations Walking;Standing;House hold activities   How long can you sit comfortably? unlimited   How long can you stand comfortably? 5 minutes   How long can you walk comfortably? 30 minutes   Currently in Pain? Yes   Pain Score 5    Pain Location Shoulder   Pain Orientation Right   Pain Descriptors / Indicators Aching   Pain Type Chronic pain   Pain Radiating Towards tingling down my arm   Pain Onset More than a month ago   Pain Frequency Intermittent   Aggravating Factors  not sure   Pain Relieving Factors not sure   Effect of Pain on Daily Activities can't do anything when having that pain and tingling.   Multiple Pain Sites No                          [facility] Adult PT Treatment/Exercise - 02/22/17 0001      Knee/Hip Exercises: Aerobic   Nustep L1 x 8 min      Shoulder Exercises: Supine   Horizontal ABduction Strengthening;Both;15 reps;Theraband   Theraband Level (Shoulder Horizontal ABduction) Level 1 (Yellow)   Other Supine Exercises thoracic extension with towel behind back   Other Supine Exercises flexion and abduction with cane - 2 min each     Shoulder Exercises: ROM/Strengthening   Other ROM/Strengthening Exercises lat pull down with 1 plate - 5x but had trouble with stability     Manual Therapy   Kinesiotex Facilitate Muscle     Kinesiotix   Inhibit Muscle  RT upper trap/scalene  Pt verbally understood wear time, skin care   Facilitate Muscle  Rt supraspinatus and infraspinatus     Neck Exercises: Stretches   Upper Trapezius Stretch 3 reps;30 seconds                  PT Short Term Goals - 02/17/17 1108      PT SHORT TERM GOAL #1   Title Pt will be independent with her HEP.    Time  3   Period Weeks   Status Achieved           PT Long Term Goals - 02/22/17 1145      PT LONG TERM GOAL #1   Title Pt will be independent with HEP to help with pain relief and overall strengthening of bilateral LE's.    Time 6   Period Weeks   Status On-going     PT LONG TERM GOAL #2   Title Pt will be able to amb > 500 feet with step through gait pattern with no LOB using the least restricitve device.    Time 6   Period Weeks   Status On-going     PT LONG TERM GOAL #3   Title Pt will be able to report ability to perform ADL's with pain </= 3/10  in order to improve functional mobility.    Time 6   Period Weeks   Status On-going     PT LONG TERM GOAL #4   Time 4               Plan - 02/22/17 1121    Clinical Impression Statement Pt had decreased shoulder and arm pain and tingling after treatment today.  Pt continues to report legs are feeling better since last  dry needling treatment.  Pt will benefit from skilled PT for postural strength and LE strengthening in order to improve functional activities without increased pain.   Rehab Potential Good   PT Treatment/Interventions ADLs/Self Care Home Management;Moist Heat;Electrical Stimulation;Cryotherapy;Gait training;Stair training;Functional mobility training;Therapeutic activities;Therapeutic exercise;Balance training;Neuromuscular re-education;Dry needling;Patient/family education;Manual techniques;Passive range of motion;Taping;Energy conservation   PT Next Visit Plan progress stretches, LE strengthening, postural strengthening, thoracic extension   Consulted and Agree with Plan of Care Patient      Patient will benefit from skilled therapeutic intervention in order to improve the following deficits and impairments:  Abnormal gait, Pain, Decreased activity tolerance, Difficulty walking, Postural dysfunction, Decreased strength, Decreased balance, Decreased range of motion  Visit Diagnosis: Pain in left leg  Pain in right leg  Pain in left upper arm  Pain in left lower leg  Abnormality of gait     Problem List Patient Active Problem List   Diagnosis Date Noted  . LBBB (left bundle branch block) 12/25/2015  . PVCs (premature ventricular contractions) 12/25/2015  . Hyperlipidemia 12/25/2015  . Fibromyalgia 01/16/2014  . Cholelithiasis 04/25/2013  . Paralysis agitans ([place]) 01/15/2013  . Unspecified hereditary and idiopathic peripheral neuropathy 01/15/2013    Zannie Cove, PT 02/22/2017, 1:35 PM  [facility] [address] [place], [place], [address] Phone: [phone]   Fax:  [phone]  Name: Tashonna Descoteaux MRN: [id number] Date of Birth: 09/07/1950

## 2017-02-23 ENCOUNTER — Telehealth: Payer: Self-pay | Admitting: Occupational Therapy

## 2017-02-23 DIAGNOSIS — G20 Parkinson's disease: Secondary | ICD-10-CM

## 2017-02-23 NOTE — Telephone Encounter (Signed)
Dr. Carles Collet,   [name] was seen for Parkinsons  screens 02/23/17.   Pt may benefit from OT referral due  to slowing of coordination scores, RUE pain (reports injury at [facility]), and incr bradykinesia noted with functional movement.   If you are in agreement, please send updated order for Occupational therapy via epic.   Thank you,   Vianne Bulls, OTR/L [facility] [address]. [place], [place]  [address] [phone] phone [phone] 02/23/17 5:01 PM

## 2017-02-24 ENCOUNTER — Ambulatory Visit: Payer: Medicare Other | Admitting: Physical Therapy

## 2017-02-24 DIAGNOSIS — M79605 Pain in left leg: Secondary | ICD-10-CM

## 2017-02-24 DIAGNOSIS — M79622 Pain in left upper arm: Secondary | ICD-10-CM | POA: Diagnosis not present

## 2017-02-24 DIAGNOSIS — M79662 Pain in left lower leg: Secondary | ICD-10-CM | POA: Diagnosis not present

## 2017-02-24 DIAGNOSIS — R269 Unspecified abnormalities of gait and mobility: Secondary | ICD-10-CM

## 2017-02-24 DIAGNOSIS — M79604 Pain in right leg: Secondary | ICD-10-CM

## 2017-02-24 DIAGNOSIS — R279 Unspecified lack of coordination: Secondary | ICD-10-CM | POA: Diagnosis not present

## 2017-02-24 NOTE — Therapy (Signed)
[facility] [address] [place] [place], [place], [place], [address] Phone: [phone]   Fax:  [phone]  Physical Therapy Treatment  Patient Details  Name: Morgan [name] MRN: [id number] Date of Birth: [birth year] Referring Provider: Alonza Bogus MD  Encounter Date: 02/24/2017      PT End of Session - 02/24/17 1152    Visit Number 7   Number of Visits 12   Date for PT Re-Evaluation 04/01/17   Authorization Type KX modifier after visit 11-14-2022, G-codes every 10th visit   PT Start Time 1100   PT Stop Time 1140   PT Time Calculation (min) 40 min   Activity Tolerance Patient tolerated treatment well   Behavior During Therapy [facility] for tasks assessed/performed      Past Medical History:  Diagnosis Date  . Fibromyalgia   . Hypercholesteremia    controlled on medication  . Hypertension   . Melanoma ([place])   . Osteopenia   . Parkinson disease [facility])     Past Surgical History:  Procedure Laterality Date  . TONSILLECTOMY    . TUBAL LIGATION      There were no vitals filed for this visit.      Subjective Assessment - 02/24/17 1159    Subjective I am not doing too well today.  My legs hurt but in my calves today.    Pertinent History Parkinson's, fibromalgia   Limitations Walking;Standing;House hold activities   How long can you sit comfortably? unlimited   How long can you stand comfortably? 5 minutes   How long can you walk comfortably? 30 minutes   Currently in Pain? Yes   Pain Score 5    Pain Location Calf   Pain Orientation Right;Left   Pain Descriptors / Indicators Aching   Pain Type Chronic pain   Pain Onset More than a month ago   Pain Frequency Constant   Aggravating Factors  always hurts   Pain Relieving Factors nothing   Multiple Pain Sites No                         [facility] Adult PT Treatment/Exercise - 02/24/17 0001      Knee/Hip Exercises: Aerobic   Nustep L2 x 6 min      Manual  Therapy   Manual Therapy Soft tissue mobilization   Soft tissue mobilization bilateral quads, gastroc, and soleus          Trigger Point Dry Needling - 02/24/17 1150    Consent Given? Yes   Muscles Treated Lower Body Gastrocnemius;Soleus   Quadriceps Response Twitch response elicited;Palpable increased muscle length   Gastrocnemius Response Twitch response elicited;Palpable increased muscle length   Soleus Response Twitch response elicited;Palpable increased muscle length                PT Short Term Goals - 02/17/17 1108      PT SHORT TERM GOAL #1   Title Pt will be independent with her HEP.    Time 3   Period Weeks   Status Achieved           PT Long Term Goals - 02/22/17 1145      PT LONG TERM GOAL #1   Title Pt will be independent with HEP to help with pain relief and overall strengthening of bilateral LE's.    Time 6   Period Weeks   Status On-going     PT LONG TERM GOAL #2  Title Pt will be able to amb > 500 feet with step through gait pattern with no LOB using the least restricitve device.    Time 6   Period Weeks   Status On-going     PT LONG TERM GOAL #3   Title Pt will be able to report ability to perform ADL's with pain </= 3/10  in order to improve functional mobility.    Time 6   Period Weeks   Status On-going     PT LONG TERM GOAL #4   Time 4               Plan - 02/24/17 1153    Clinical Impression Statement Patient had reduced symptoms after manual therapy and needling today.  Overall, patient reports the pain in her quads has subsided but returned mildly.  Pt was having a lot of pain in calves today and is still feeling right arm soreness.  Pt will continue to benefit from skilled PT for reduced muscle spasms and work on postural strengthening.   PT Treatment/Interventions ADLs/Self Care Home Management;Moist Heat;Electrical Stimulation;Cryotherapy;Gait training;Stair training;Functional mobility training;Therapeutic  activities;Therapeutic exercise;Balance training;Neuromuscular re-education;Dry needling;Patient/family education;Manual techniques;Passive range of motion;Taping;Energy conservation   PT Next Visit Plan progress stretches, LE strengthening, postural strengthening, thoracic extension   Consulted and Agree with Plan of Care Patient      Patient will benefit from skilled therapeutic intervention in order to improve the following deficits and impairments:  Abnormal gait, Pain, Decreased activity tolerance, Difficulty walking, Postural dysfunction, Decreased strength, Decreased balance, Decreased range of motion  Visit Diagnosis: Pain in left leg  Pain in right leg  Pain in left upper arm  Pain in left lower leg  Abnormality of gait     Problem List Patient Active Problem List   Diagnosis Date Noted  . LBBB (left bundle branch block) 12/25/2015  . PVCs (premature ventricular contractions) 12/25/2015  . Hyperlipidemia 12/25/2015  . Fibromyalgia 01/16/2014  . Cholelithiasis 04/25/2013  . Paralysis agitans ([place]) 01/15/2013  . Unspecified hereditary and idiopathic peripheral neuropathy 01/15/2013    Zannie Cove, PT 02/24/2017, 12:02 PM  [facility] [address], [place], [place], [address] Phone: [phone]   Fax:  [phone]  Name: Morgan [name] MRN: [id number] Date of Birth: 01-Aug-1951

## 2017-02-24 NOTE — Telephone Encounter (Signed)
Referral sent 

## 2017-02-27 ENCOUNTER — Ambulatory Visit: Payer: Medicare Other | Attending: Neurology | Admitting: Physical Therapy

## 2017-02-27 DIAGNOSIS — M79622 Pain in left upper arm: Secondary | ICD-10-CM | POA: Diagnosis not present

## 2017-02-27 DIAGNOSIS — M79604 Pain in right leg: Secondary | ICD-10-CM | POA: Insufficient documentation

## 2017-02-27 DIAGNOSIS — M79662 Pain in left lower leg: Secondary | ICD-10-CM | POA: Insufficient documentation

## 2017-02-27 DIAGNOSIS — M79605 Pain in left leg: Secondary | ICD-10-CM | POA: Insufficient documentation

## 2017-02-27 DIAGNOSIS — R269 Unspecified abnormalities of gait and mobility: Secondary | ICD-10-CM | POA: Insufficient documentation

## 2017-02-27 NOTE — Therapy (Signed)
[facility] [address] [place], [place], [place], [address] Phone: [phone]   Fax:  [phone]  Physical Therapy Treatment  Patient Details  Name: Morgan [name] MRN: [id number] Date of Birth: [birth year] Referring Provider: Alonza Bogus MD  Encounter Date: 02/27/2017      PT End of Session - 02/27/17 1147    Visit Number 8   Number of Visits 12   Date for PT Re-Evaluation 04/01/17   Authorization Type KX modifier after visit 12-06-22, G-codes every 10th visit   PT Start Time 1105   PT Stop Time 1145   PT Time Calculation (min) 40 min   Activity Tolerance Patient tolerated treatment well   Behavior During Therapy [facility] for tasks assessed/performed      Past Medical History:  Diagnosis Date  . Fibromyalgia   . Hypercholesteremia    controlled on medication  . Hypertension   . Melanoma ([place])   . Osteopenia   . Parkinson disease [facility])     Past Surgical History:  Procedure Laterality Date  . TONSILLECTOMY    . TUBAL LIGATION      There were no vitals filed for this visit.      Subjective Assessment - 02/27/17 1112    Subjective My legs felt horrible after previous treatment and I couldn't walk for 2 days.  The pain in the calves is gone now.   Pertinent History Parkinson's, fibromalgia   Limitations Walking;Standing;House hold activities   How long can you sit comfortably? unlimited   How long can you stand comfortably? 5 minutes   How long can you walk comfortably? 30 minutes   Currently in Pain? Yes   Pain Score 4    Pain Location Leg   Pain Orientation Right;Left;Anterior;Upper   Pain Descriptors / Indicators Aching   Pain Type Chronic pain   Pain Onset More than a month ago   Pain Frequency Constant   Aggravating Factors  nothing makes it worse   Pain Relieving Factors I put my legs up but doesn't go away   Multiple Pain Sites No                         [facility] Adult PT  Treatment/Exercise - 02/27/17 0001      Neuro Re-ed    Neuro Re-ed Details  educated and cued on posture throughout exercises     Knee/Hip Exercises: Aerobic   Nustep L3 x 8 min      Shoulder Exercises: Seated   Extension Strengthening;Both;20 reps;Weights   Extension Weight (lbs) 15#   Retraction Strengthening;Both;20 reps   Row Strengthening;Both;20 reps;Weights   Row Weight (lbs) 20#   Flexion Limitations right side flexion for improved sitting posture   Other Seated Exercises Hands clasped flexion 10x 2   Other Seated Exercises W's seated with pulley - 20#     Kinesiotix   Inhibit Muscle  RT upper trap/scalene  Pt verbally understood wear time, skin care   Facilitate Muscle  Rt supraspinatus and infraspinatus     Neck Exercises: Stretches   Upper Trapezius Stretch 3 reps;30 seconds                  PT Short Term Goals - 02/17/17 1108      PT SHORT TERM GOAL #1   Title Pt will be independent with her HEP.    Time 3   Period Weeks   Status Achieved  PT Long Term Goals - 02/27/17 1231      PT LONG TERM GOAL #1   Title Pt will be independent with HEP to help with pain relief and overall strengthening of bilateral LE's.    Time 6   Period Weeks   Status On-going     PT LONG TERM GOAL #2   Title Pt will be able to amb > 500 feet with step through gait pattern with no LOB using the least restricitve device.    Time 6   Period Weeks   Status On-going     PT LONG TERM GOAL #3   Title Pt will be able to report ability to perform ADL's with pain </= 3/10  in order to improve functional mobility.    Time 6   Period Weeks   Status On-going               Plan - 02/27/17 1146    Clinical Impression Statement Patient reports tape felt good after treatment.  Pt needs a lot of cues for posture.  She demonstrates improved upright posture after side bending to the right and stretching left thorax and ribcage. Pt need skilled PT to continue to  progress posture and alignment as well as help managing muscle spasms for reduced pain.   Rehab Potential Good   PT Treatment/Interventions ADLs/Self Care Home Management;Moist Heat;Electrical Stimulation;Cryotherapy;Gait training;Stair training;Functional mobility training;Therapeutic activities;Therapeutic exercise;Balance training;Neuromuscular re-education;Dry needling;Patient/family education;Manual techniques;Passive range of motion;Taping;Energy conservation   PT Next Visit Plan estim for quads, postural strengthening, thoracic ext   Consulted and Agree with Plan of Care Patient      Patient will benefit from skilled therapeutic intervention in order to improve the following deficits and impairments:  Abnormal gait, Pain, Decreased activity tolerance, Difficulty walking, Postural dysfunction, Decreased strength, Decreased balance, Decreased range of motion  Visit Diagnosis: Pain in left leg  Pain in right leg  Pain in left upper arm  Pain in left lower leg  Abnormality of gait     Problem List Patient Active Problem List   Diagnosis Date Noted  . LBBB (left bundle branch block) 12/25/2015  . PVCs (premature ventricular contractions) 12/25/2015  . Hyperlipidemia 12/25/2015  . Fibromyalgia 01/16/2014  . Cholelithiasis 04/25/2013  . Paralysis agitans ([place]) 01/15/2013  . Unspecified hereditary and idiopathic peripheral neuropathy 01/15/2013    Zannie Cove, PT 02/27/2017, 1:23 PM  [facility] [address], [place], [place], [address] Phone: [phone]   Fax:  [phone]  Name: Morgan [name] MRN: [id number] Date of Birth: 1950/10/01

## 2017-03-03 ENCOUNTER — Ambulatory Visit: Payer: Medicare Other | Admitting: Physical Therapy

## 2017-03-03 ENCOUNTER — Encounter: Payer: Medicare Other | Admitting: Physical Therapy

## 2017-03-03 ENCOUNTER — Encounter: Payer: Self-pay | Admitting: Physical Therapy

## 2017-03-03 DIAGNOSIS — M79605 Pain in left leg: Secondary | ICD-10-CM | POA: Diagnosis not present

## 2017-03-03 DIAGNOSIS — R269 Unspecified abnormalities of gait and mobility: Secondary | ICD-10-CM

## 2017-03-03 DIAGNOSIS — M79622 Pain in left upper arm: Secondary | ICD-10-CM

## 2017-03-03 DIAGNOSIS — M79604 Pain in right leg: Secondary | ICD-10-CM

## 2017-03-03 DIAGNOSIS — M79662 Pain in left lower leg: Secondary | ICD-10-CM | POA: Diagnosis not present

## 2017-03-03 NOTE — Therapy (Signed)
[facility] [address], [place], [place], [address] Phone: [phone]   Fax:  [phone]  Physical Therapy Treatment  Patient Details  Name: Morgan [name] MRN: [id number] Date of Birth: 01-04-1951 Referring Provider: Alonza Bogus MD  Encounter Date: 03/03/2017      PT End of Session - 03/03/17 1102    Visit Number 9   Number of Visits 12   Date for PT Re-Evaluation 04/01/17   Authorization Type KX modifier after visit 12/07/22, G-codes every 10th visit   PT Start Time 1102   PT Stop Time 1147   PT Time Calculation (min) 45 min   Activity Tolerance Patient tolerated treatment well   Behavior During Therapy [facility] for tasks assessed/performed      Past Medical History:  Diagnosis Date  . Fibromyalgia   . Hypercholesteremia    controlled on medication  . Hypertension   . Melanoma ([place])   . Osteopenia   . Parkinson disease [facility])     Past Surgical History:  Procedure Laterality Date  . TONSILLECTOMY    . TUBAL LIGATION      There were no vitals filed for this visit.      Subjective Assessment - 03/03/17 1139    Subjective My legs feel better when my husband massages them.  They have been good and bad.  I'm doing good today.   Pertinent History Parkinson's, fibromalgia   Limitations Walking;Standing;House hold activities   How long can you sit comfortably? unlimited   How long can you stand comfortably? 5 minutes   How long can you walk comfortably? 30 minutes   Currently in Pain? No/denies                         [facility] Adult PT Treatment/Exercise - 03/03/17 0001      Neuro Re-ed    Neuro Re-ed Details  cues for posture and leaning forward with decreased shoulder elevation throughout     Shoulder Exercises: Seated   Extension Strengthening;Both;20 reps;Weights   Extension Weight (lbs) 15#   Row Strengthening;Both;20 reps;Weights   Row Weight (lbs) 20#   Other Seated Exercises  thoracic rotation    Other Seated Exercises W's seated with pulley - 20# x 20 reps     Modalities   Modalities Electrical Stimulation;Moist Heat     Moist Heat Therapy   Number Minutes Moist Heat 15 Minutes   Moist Heat Location Other (comment)  bilteral quads     Electrical Stimulation   Electrical Stimulation Location bilateral quads   Electrical Stimulation Action IFC   Electrical Stimulation Parameters to tolerance x 15 min   Electrical Stimulation Goals Pain;Tone                PT Education - 03/03/17 1139    Education provided Yes   Education Details educated on where to buy TENS   Person(s) Educated Patient   Methods Explanation;Handout   Comprehension Verbalized understanding          PT Short Term Goals - 02/17/17 1108      PT SHORT TERM GOAL #1   Title Pt will be independent with her HEP.    Time 3   Period Weeks   Status Achieved           PT Long Term Goals - 02/27/17 1231      PT LONG TERM GOAL #1   Title Pt will be independent with HEP  to help with pain relief and overall strengthening of bilateral LE's.    Time 6   Period Weeks   Status On-going     PT LONG TERM GOAL #2   Title Pt will be able to amb > 500 feet with step through gait pattern with no LOB using the least restricitve device.    Time 6   Period Weeks   Status On-going     PT LONG TERM GOAL #3   Title Pt will be able to report ability to perform ADL's with pain </= 3/10  in order to improve functional mobility.    Time 6   Period Weeks   Status On-going               Plan - 03/03/17 1102    Clinical Impression Statement Patient needs cues for posture and reduced shoulder elevation.  She responded well to stim with improved gait speed after treatment.  Pt continues to benefit from skilled PT for improved gait and posture with reduced pain.   PT Treatment/Interventions ADLs/Self Care Home Management;Moist Heat;Electrical Stimulation;Cryotherapy;Gait  training;Stair training;Functional mobility training;Therapeutic activities;Therapeutic exercise;Balance training;Neuromuscular re-education;Dry needling;Patient/family education;Manual techniques;Passive range of motion;Taping;Energy conservation   PT Next Visit Plan email to rep for TENS through [employer], continue soft tissue release for muscle spasms and posture education and strengthening   Consulted and Agree with Plan of Care Patient      Patient will benefit from skilled therapeutic intervention in order to improve the following deficits and impairments:  Abnormal gait, Pain, Decreased activity tolerance, Difficulty walking, Postural dysfunction, Decreased strength, Decreased balance, Decreased range of motion  Visit Diagnosis: Pain in left leg  Pain in right leg  Pain in left upper arm  Pain in left lower leg  Abnormality of gait     Problem List Patient Active Problem List   Diagnosis Date Noted  . LBBB (left bundle branch block) 12/25/2015  . PVCs (premature ventricular contractions) 12/25/2015  . Hyperlipidemia 12/25/2015  . Fibromyalgia 01/16/2014  . Cholelithiasis 04/25/2013  . Paralysis agitans ([place]) 01/15/2013  . Unspecified hereditary and idiopathic peripheral neuropathy 01/15/2013    Zannie Cove, PT 03/03/2017, 11:58 AM  [facility] [address] [place], [place], [address] Phone: [phone]   Fax:  [phone]  Name: Morgan [name] MRN: [id number] Date of Birth: Apr 04, 1951

## 2017-03-03 NOTE — Patient Instructions (Signed)
   TENS 7000 - [email or web address]

## 2017-03-08 ENCOUNTER — Ambulatory Visit: Payer: Medicare Other | Admitting: Physical Therapy

## 2017-03-08 DIAGNOSIS — M79662 Pain in left lower leg: Secondary | ICD-10-CM | POA: Diagnosis not present

## 2017-03-08 DIAGNOSIS — M79605 Pain in left leg: Secondary | ICD-10-CM | POA: Diagnosis not present

## 2017-03-08 DIAGNOSIS — M79622 Pain in left upper arm: Secondary | ICD-10-CM | POA: Diagnosis not present

## 2017-03-08 DIAGNOSIS — M79604 Pain in right leg: Secondary | ICD-10-CM | POA: Diagnosis not present

## 2017-03-08 DIAGNOSIS — R269 Unspecified abnormalities of gait and mobility: Secondary | ICD-10-CM | POA: Diagnosis not present

## 2017-03-08 NOTE — Therapy (Signed)
[facility] [address], [place], [place], [address] Phone: [phone]   Fax:  [phone]  Physical Therapy Treatment  Patient Details  Name: Morgan [name] MRN: [id number] Date of Birth: Nov 11, 1950 Referring Provider: Alonza Bogus MD  Encounter Date: 03/08/2017      PT End of Session - 03/08/17 1352    Visit Number 10   Number of Visits 12   Date for PT Re-Evaluation 04/01/17   Authorization Type KX modifier after visit 12-06-22, G-codes every 10th visit   PT Start Time 1100   PT Stop Time 1147   PT Time Calculation (min) 47 min   Activity Tolerance Patient tolerated treatment well   Behavior During Therapy [facility] for tasks assessed/performed      Past Medical History:  Diagnosis Date  . Fibromyalgia   . Hypercholesteremia    controlled on medication  . Hypertension   . Melanoma ([place])   . Osteopenia   . Parkinson disease [facility])     Past Surgical History:  Procedure Laterality Date  . TONSILLECTOMY    . TUBAL LIGATION      There were no vitals filed for this visit.      Subjective Assessment - 03/08/17 1829    Subjective I feel not good today.  I am having more trouble getting out of chairs today.   Pertinent History Parkinson's, fibromalgia   Limitations Walking;Standing;House hold activities   How long can you sit comfortably? unlimited   How long can you stand comfortably? 5 minutes   How long can you walk comfortably? 30 minutes   Currently in Pain? Yes   Pain Score 5    Pain Location Leg   Pain Orientation Right;Left;Anterior;Upper   Pain Descriptors / Indicators Aching   Pain Onset More than a month ago   Pain Frequency Constant   Aggravating Factors  nothing it is constant   Pain Relieving Factors nothing   Multiple Pain Sites No                         [facility] Adult PT Treatment/Exercise - 03/08/17 0001      Self-Care   Self-Care Other Self-Care Comments   Other  Self-Care Comments  educated on using at home tens, placement and setting options     Neuro Re-ed    Neuro Re-ed Details  standing weight shift forward and back for improved proprioception to stop from falling back     Knee/Hip Exercises: Stretches   Gastroc Stretch Right;Left;3 reps;30 seconds     Knee/Hip Exercises: Seated   Sit to Sand 20 reps;without UE support  cues to lean forward and shift weight out of heels     Moist Heat Therapy   Number Minutes Moist Heat 15 Minutes   Moist Heat Location --  quads bilateral     Electrical Stimulation   Electrical Stimulation Location left quads   Electrical Stimulation Action IFC   Electrical Stimulation Parameters to tolerance x 15 min   Electrical Stimulation Goals Pain                PT Education - 03/08/17 1137    Education provided Yes   Education Details gastroc stretch   Person(s) Educated Patient   Methods Explanation   Comprehension Verbalized understanding          PT Short Term Goals - 02/17/17 1108      PT SHORT TERM GOAL #1  Title Pt will be independent with her HEP.    Time 3   Period Weeks   Status Achieved           PT Long Term Goals - 2017-03-19 1831      PT LONG TERM GOAL #1   Title Pt will be independent with HEP to help with pain relief and overall strengthening of bilateral LE's.    Time 6   Period Weeks   Status On-going     PT LONG TERM GOAL #2   Title Pt will be able to amb > 500 feet with step through gait pattern with no LOB using the least restricitve device.    Time 6   Period Weeks   Status On-going     PT LONG TERM GOAL #3   Title Pt will be able to report ability to perform ADL's with pain </= 3/10  in order to improve functional mobility.    Time 6   Period Weeks   Status On-going     PT LONG TERM GOAL #4   Title ...   Baseline ...     PT LONG TERM GOAL #5   Title ...   Baseline ...               Plan - 03/19/2017 1353    Clinical Impression  Statement Patient is able to use at home TENS that she has.  She has diffculty with sit to stand today and tends to lean back.  Pt improves with focus on tactile sensation to bring weight forwad . She will benefit from skilled PT to work on final HEP for improved LE and postrure strength.   Rehab Potential Good   PT Treatment/Interventions ADLs/Self Care Home Management;Moist Heat;Electrical Stimulation;Cryotherapy;Gait training;Stair training;Functional mobility training;Therapeutic activities;Therapeutic exercise;Balance training;Neuromuscular re-education;Dry needling;Patient/family education;Manual techniques;Passive range of motion;Taping;Energy conservation   PT Next Visit Plan final HEP pt will most likely discharge one more visit   Consulted and Agree with Plan of Care Patient      Patient will benefit from skilled therapeutic intervention in order to improve the following deficits and impairments:  Abnormal gait, Pain, Decreased activity tolerance, Difficulty walking, Postural dysfunction, Decreased strength, Decreased balance, Decreased range of motion  Visit Diagnosis: Pain in left leg  Pain in right leg  Pain in left upper arm  Pain in left lower leg  Abnormality of gait       G-Codes - Mar 19, 2017 1832    Functional Assessment Tool Used (Outpatient Only) clinical assessment   Functional Limitation Mobility: Walking and moving around   Mobility: Walking and Moving Around Current Status (A2130) At least 20 percent but less than 40 percent impaired, limited or restricted   Mobility: Walking and Moving Around Goal Status [phone]) At least 20 percent but less than 40 percent impaired, limited or restricted      Problem List Patient Active Problem List   Diagnosis Date Noted  . LBBB (left bundle branch block) 12/25/2015  . PVCs (premature ventricular contractions) 12/25/2015  . Hyperlipidemia 12/25/2015  . Fibromyalgia 01/16/2014  . Cholelithiasis 04/25/2013  . Paralysis  agitans ([place]) 01/15/2013  . Unspecified hereditary and idiopathic peripheral neuropathy 01/15/2013    Zannie Cove, PT 03/19/2017, 6:33 PM  [facility] [address] [place], [place], [address] Phone: [phone]   Fax:  [phone]  Name: Morgan [name] MRN: [id number] Date of Birth: 1951-01-02

## 2017-03-08 NOTE — Patient Instructions (Signed)
Calf Stretch    Place one leg forward, bent, other leg behind and straight. Lean forward keeping back heel flat. Hold _30___ seconds while counting out loud. Repeat with other leg forward. Repeat 3____ times. Do _1___ sessions per day.  http://gt2.exer.us/478   Copyright  VHI. All rights reserved.   

## 2017-03-10 ENCOUNTER — Ambulatory Visit: Payer: Medicare Other | Admitting: Physical Therapy

## 2017-03-10 ENCOUNTER — Encounter: Payer: Self-pay | Admitting: Physical Therapy

## 2017-03-10 DIAGNOSIS — M79605 Pain in left leg: Secondary | ICD-10-CM

## 2017-03-10 DIAGNOSIS — M79662 Pain in left lower leg: Secondary | ICD-10-CM | POA: Diagnosis not present

## 2017-03-10 DIAGNOSIS — M79604 Pain in right leg: Secondary | ICD-10-CM

## 2017-03-10 DIAGNOSIS — R269 Unspecified abnormalities of gait and mobility: Secondary | ICD-10-CM

## 2017-03-10 DIAGNOSIS — M79622 Pain in left upper arm: Secondary | ICD-10-CM

## 2017-03-10 NOTE — Therapy (Addendum)
[facility] [address] [place], [place], [address] Phone: [phone]   Fax:  [phone]  Physical Therapy Treatment  Patient Details  Name: Morgan [name] MRN: [id number] Date of Birth: 1951-08-10 Referring Provider: Alonza Bogus MD  Encounter Date: 03/10/2017      PT End of Session - 03/10/17 1108    Visit Number 11   Number of Visits 12   Date for PT Re-Evaluation 04/01/17   Authorization Type KX modifier after visit 09-28-22, G-codes every 10th visit   PT Start Time 1105   PT Stop Time 1145   PT Time Calculation (min) 40 min   Activity Tolerance Patient tolerated treatment well   Behavior During Therapy [facility] for tasks assessed/performed      Past Medical History:  Diagnosis Date  . Fibromyalgia   . Hypercholesteremia    controlled on medication  . Hypertension   . Melanoma ([place])   . Osteopenia   . Parkinson disease [facility])     Past Surgical History:  Procedure Laterality Date  . TONSILLECTOMY    . TUBAL LIGATION      There were no vitals filed for this visit.      Subjective Assessment - 03/10/17 1110    Subjective Generally achy today.   Pertinent History Parkinson's, fibromalgia   Limitations Walking;Standing;House hold activities   Currently in Pain? No/denies  Nothing major to speak of.    Multiple Pain Sites No            [facility] PT Assessment - 03/10/17 0001      Strength   Strength Assessment Site Hip   Right/Left Hip Right;Left   Right Hip Flexion 4+/5   Right Hip Extension 4/5   Right Hip ABduction 4+/5   Right Hip ADduction 5/5   Left Hip Flexion 4/5   Left Hip Extension 4/5   Left Hip ABduction 4+/5   Left Hip ADduction 5/5     Standardized Balance Assessment   Standardized Balance Assessment Berg Balance Test     Berg Balance Test   Sit to Stand Able to stand without using hands and stabilize independently   Standing Unsupported Able to stand safely 2 minutes   Sitting with Back Unsupported but Feet Supported on Floor or Stool Able to sit safely and securely 2 minutes   Stand to Sit Sits safely with minimal use of hands   Transfers Able to transfer safely, minor use of hands   Standing Unsupported with Eyes Closed Able to stand 10 seconds safely   Standing Ubsupported with Feet Together Able to place feet together independently and stand 1 minute safely   From Standing, Reach Forward with Outstretched Arm Can reach confidently >25 cm (10")   From Standing Position, Pick up Object from Floor Unable to pick up and needs supervision   From Standing Position, Turn to Look Behind Over each Shoulder Turn sideways only but maintains balance   Turn 360 Degrees Able to turn 360 degrees safely one side only in 4 seconds or less   Standing Unsupported, Alternately Place Feet on Step/Stool Able to stand independently and safely and complete 8 steps in 20 seconds   Standing Unsupported, One Foot in Front Able to place foot tandem independently and hold 30 seconds   Standing on One Leg Able to lift leg independently and hold > 10 seconds   Total Score 50  [facility] Adult PT Treatment/Exercise - 03/10/17 0001      Self-Care   Self-Care Other Self-Care Comments   Other Self-Care Comments  Sit to stand techniques  Verbal review of HEP as pt decline demonstration     Knee/Hip Exercises: Aerobic   Nustep L1 x 10 min  Review of status & goals                  PT Short Term Goals - 02/17/17 1108      PT SHORT TERM GOAL #1   Title Pt will be independent with her HEP.    Time 3   Period Weeks   Status Achieved           PT Long Term Goals - 03/10/17 1111      PT LONG TERM GOAL #1   Title Pt will be independent with HEP to help with pain relief and overall strengthening of bilateral LE's.    Time 6   Period Weeks   Status Achieved     PT LONG TERM GOAL #2   Title Pt will be able to amb > 500 feet with step  through gait pattern with no LOB using the least restricitve device.    Time 6   Period Weeks   Status Not Met  Still has wobbly/LOB-like episodes     PT LONG TERM GOAL #3   Title Pt will be able to report ability to perform ADL's with pain </= 3/10  in order to improve functional mobility.    Time 6   Period Weeks   Status Not Met  Still occasions of pain greater than 3/10               Plan - 03/10/17 1108    Clinical Impression Statement Pt continues to have what she refers to as good days and bad days regarding her balance. For this reason she did not consistently meet her long term goal. Pain also fluctuate day to day, limiting her reaching the long term pain goal. Pt also continues with intermittent Rt arm pain and tingling. She is using K tape to manage this. She intends on speaking with her MD about his on her next visit.  She has also agreed to be more consistent with her Parkinson HEP.    Rehab Potential Good   PT Frequency 2x / week   PT Duration 6 weeks   PT Treatment/Interventions ADLs/Self Care Home Management;Moist Heat;Electrical Stimulation;Cryotherapy;Gait training;Stair training;Functional mobility training;Therapeutic activities;Therapeutic exercise;Balance training;Neuromuscular re-education;Dry needling;Patient/family education;Manual techniques;Passive range of motion;Taping;Energy conservation   PT Next Visit Plan DC today per POC/PT instructed   Consulted and Agree with Plan of Care Patient      Patient will benefit from skilled therapeutic intervention in order to improve the following deficits and impairments:  Abnormal gait, Pain, Decreased activity tolerance, Difficulty walking, Postural dysfunction, Decreased strength, Decreased balance, Decreased range of motion  Visit Diagnosis: Pain in left leg  Pain in right leg  Pain in left upper arm  Pain in left lower leg  Abnormality of gait  Gcodes Current: CJ Goal: CJ Discharge CJ   Problem  List Patient Active Problem List   Diagnosis Date Noted  . LBBB (left bundle branch block) 12/25/2015  . PVCs (premature ventricular contractions) 12/25/2015  . Hyperlipidemia 12/25/2015  . Fibromyalgia 01/16/2014  . Cholelithiasis 04/25/2013  . Paralysis agitans ([place]) 01/15/2013  . Unspecified hereditary and idiopathic peripheral neuropathy 01/15/2013    Myrene Galas, PTA 03/10/17 11:51  AM  [facility] [address] [place], [place], [address] Phone: [phone]   Fax:  [phone]  Name: Reyanna Baley MRN: [id number] Date of Birth: 1950/09/16  PHYSICAL THERAPY DISCHARGE SUMMARY  Visits from Start of Care: 11  Current functional level related to goals / functional outcomes: See above goals   Remaining deficits: See above notes   Education / Equipment: HEP  Plan: Patient agrees to discharge.  Patient goals were partially met. Patient is being discharged due to not returning since the last visit.  ?????         Pt unable to fully meet goals, but demonstrates some improvements.  She is not expected to show greater improvements with PT and is advised to continue working on self care and HEP at home for maintenance.  Zannie Cove, PT 03/10/17 11:58 AM

## 2017-03-14 ENCOUNTER — Ambulatory Visit: Payer: Medicare Other | Admitting: Physical Therapy

## 2017-04-10 ENCOUNTER — Other Ambulatory Visit: Payer: Self-pay | Admitting: Neurology

## 2017-04-10 ENCOUNTER — Ambulatory Visit: Payer: Medicare Other | Attending: Family Medicine | Admitting: Occupational Therapy

## 2017-04-10 DIAGNOSIS — R278 Other lack of coordination: Secondary | ICD-10-CM | POA: Diagnosis not present

## 2017-04-10 DIAGNOSIS — R41840 Attention and concentration deficit: Secondary | ICD-10-CM | POA: Insufficient documentation

## 2017-04-10 DIAGNOSIS — R29898 Other symptoms and signs involving the musculoskeletal system: Secondary | ICD-10-CM | POA: Diagnosis not present

## 2017-04-10 DIAGNOSIS — R2689 Other abnormalities of gait and mobility: Secondary | ICD-10-CM

## 2017-04-10 DIAGNOSIS — R2681 Unsteadiness on feet: Secondary | ICD-10-CM | POA: Diagnosis not present

## 2017-04-10 DIAGNOSIS — R293 Abnormal posture: Secondary | ICD-10-CM | POA: Diagnosis not present

## 2017-04-10 DIAGNOSIS — R29818 Other symptoms and signs involving the nervous system: Secondary | ICD-10-CM | POA: Diagnosis not present

## 2017-04-10 DIAGNOSIS — M25511 Pain in right shoulder: Secondary | ICD-10-CM | POA: Insufficient documentation

## 2017-04-10 NOTE — Therapy (Signed)
[facility] [address] [place], [place], [address] Phone: [phone]   Fax:  [phone]  Occupational Therapy Treatment  Patient Details  Name: Morgan [name] MRN: [id number] Date of Birth: 12-13-1950 Referring Provider: Dr. Wells Guiles [name]   Encounter Date: 04/10/2017      OT End of Session - 04/10/17 1353    Visit Number 1   Number of Visits 13   Date for OT Re-Evaluation 05/25/17   Authorization Type Medicare/BCBS, G-code needed   Authorization Time Period cert. 04/10/17-06/08/17   Authorization - Visit Number 1   Authorization - Number of Visits 10   OT Start Time 1150   OT Stop Time 1884   OT Time Calculation (min) 45 min   Activity Tolerance Patient tolerated treatment well   Behavior During Therapy Impulsive      Past Medical History:  Diagnosis Date  . Fibromyalgia   . Hypercholesteremia    controlled on medication  . Hypertension   . Melanoma ([place])   . Osteopenia   . Parkinson disease [facility])     Past Surgical History:  Procedure Laterality Date  . TONSILLECTOMY    . TUBAL LIGATION      There were no vitals filed for this visit.      Subjective Assessment - 04/10/17 1201    Subjective  h[facility] R shoulder at [facility], TENS has helped with LE pain   Pertinent History Parkinson's disease  (diagnosed 2010), Fibromyalgia, L bundle branch block, peripheral neuropathy, hyperlipidemia, premature ventricular contractions   Limitations fall risk, impulsive   Patient Stated Goals improve balance for ADLs   Currently in Pain? Yes   Pain Score 2   but inconsistent in reporting   Pain Location --  R shoulder   Pain Orientation Right   Pain Descriptors / Indicators Aching;Dull;Tingling   Pain Type Acute pain   Pain Onset More than a month ago   Pain Frequency Intermittent   Aggravating Factors  unknown   Pain Relieving Factors unknown   Effect of Pain on Daily Activities OT will address  RUE pain through HEP, strategies for ADLs/positioning            [facility] OT Assessment - 04/10/17 0001      Assessment   Diagnosis Parkinson's disease    Referring Provider Dr. Wells Guiles [name]    Onset Date --  OT screen 02/21/17 indicated that pt may benefit from OT   Prior Therapy last OT 06/2015     Precautions   Precautions Fall     Balance Screen   Has the patient fallen in the past 6 months Yes   How many times? 3-4  squatting, 1 fall in shower on cruise with boat rocking     Home  Environment   Family/patient expects to be discharged to: Private residence   Lives With Spouse  mother-in-law     Prior Function   Level of Independence Independent with basic ADLs;Independent with household mobility without device;Independent with community mobility with device   Leisure Yoga, plans to start PWR! moves this week and aerobics     ADL   Eating/Feeding --  minor spills/tilts bowl    Grooming Modified independent  incr time   Upper Body Bathing Modified independent   Lower Body Bathing Modified independent   Upper Body Dressing --  mod I   Lower Body Dressing Modified independent   Toilet Transfer Modified independent  has grab bar, uses it occasionally  Toileting - Clothing Manipulation Modified independent   Toileting -  Hygiene --  min difficulty   Tub/Shower Transfer Modified independent  grab bars, seat, shower stall   Transfers/Ambulation Related to ADL's pt reports difficulty getting in/out of bed   ADL comments incr time fluctuates, "good days and bad days"     IADL   Prior Level of Function Shopping husband perform   Prior Level of Function Light Housekeeping housekeeper, spouse/mother-in-law performs light tasks   Prior Level of Function Meal [employer] husband cooks primarily; difficulty squatting   Prior Level of Function Community Mobility stopped driving due to leg pain, but may resume soon   [employer] --  not currently driving, only due to husband  now allowing   Medication Management Is responsible for taking medication in correct dosages at correct time  forgets noon dose   Physiological scientist financial matters independently (budgets, writes checks, pays rent, bills goes to bank), collects and keeps track of income     Mobility   Mobility Status History of falls   Mobility Status Comments Pt uses walking stick occasionally in community if she needs to walk a lot  LOB with turning x2 during eval     Written Expression   Dominant Hand Right   Handwriting --  not assessed      Vision - History   Additional Comments denies visual changes     Cognition   Overall Cognitive Status Impaired/Different from baseline   Area of Impairment Memory;Attention   Attention Comments easily distracted   Memory Comments pt reports forgetting noon meds occasionally   Memory --   Behaviors Impulsive  with movement     Observation/Other Assessments   Observations Pt is swollen area on R shoulder (upper trap area) that she reports has been assessed by MD and that she has had for years, pt denies change in size, but reports incr pain in this area after Boxing injury  Shoulders rounded   Standing Functional Reach Test R-13", L-10"   Other Surveys  Select   Physical Performance Test   Yes   Simulated Eating Time (seconds) not assessed   Donning Doffing Jacket Time (seconds) 13.38sec with LOB and 2 steps to recover   Donning Doffing Jacket Comments Fastening/unfastening 3 buttons in 31.28sec     Sensation   Additional Comments peripheral neuropathy     Coordination   9 Hole Peg Test Right;Left   Right 9 Hole Peg Test 22.25   Left 9 Hole Peg Test 24.03   Box and Blocks R-53blocks, L-46blocks   Other Pt noted to hike R shoulder during coordination activities   Coordination dyskinesias noted      Tone   Assessment Location Right Upper Extremity;Left Upper Extremity     ROM / Strength   AROM / PROM / Strength AROM     AROM    Overall AROM  Within functional limits for tasks performed     RUE Tone   RUE Tone Within Functional Limits     LUE Tone   LUE Tone --  minimal with distration                          OT Education - 04/10/17 1351    Education provided Yes   Education Details OT POC/eval results; issued PWR! moves class info (pt plans to join); recommended pt only use KT tape for R shoulder if it is helping (as pt reports  incr pain at times with application), situations that commonly contribute to shoulder pain with PD   Person(s) Educated Patient   Methods Explanation   Comprehension Verbalized understanding          OT Short Term Goals - 04/10/17 1519      OT SHORT TERM GOAL #1   Title Pt will be independent with updated PD-specific HEP.--check STGs 05/11/17   Time 4   Period Weeks   Status New     OT SHORT TERM GOAL #2   Title Pt will perform PPT#4 in 15sec or less without LOB.   Baseline 13.38sec with LOB requiring 2 steps   Time 4   Period Weeks   Status New     OT SHORT TERM GOAL #3   Title Pt will be able to pick up item from floor and open lower cabinets utilizing adaptive strategies without LOB.   Time 4   Period Weeks   Status New           OT Long Term Goals - 04/10/17 1522      OT LONG TERM GOAL #1   Title Pt will verbalize understanding of adaptive strategies/AE to incr ease/safety and decr R shoulder pain with ADLs/IADLs.--check LTGs 05/25/17   Time 6   Period Weeks   Status New     OT LONG TERM GOAL #2   Title Pt will verbalize understanding of ways to prevent future complications (including shoulder pain) related to PD.   Time 6   Period Weeks   Status New     OT LONG TERM GOAL #3   Title Pt will improve coordination/functional reaching for ADLs as shown by improving score on box and blocks test by at least 4 with LUE.   Baseline L-46 blocks   Time 6   Period Weeks   Status New     OT LONG TERM GOAL #4   Title Pt will report R  shoulder pain less than or equal to 3/10 for ADLs/IADLs.   Baseline fluctuates   Time 6   Period Weeks   Status New     OT LONG TERM GOAL #5   Title Pt will demo turning strategy prn during ambulation/functional tasks to incr safety/decr fall risk.   Baseline LOB with turning x2 during eval   Time 6   Period Weeks   Status New               Plan - 04/10/17 1355    Clinical Impression Statement Pt presents with bradykinesia/hypokinesia, mild rigidity, decr posture, dyskinesias, decr coordination, impulsivity, decr timing of movement, decr balance/functional mobility, cognitive deficits, RUE pain.  Pt would benefit from occupational theapy to address these deficits for improved safety/decr pain with ADLs/IADL, to prevent future complications, and improve quality of life.   Occupational Profile and client history currently impacting functional performance Pt is a 66 y.o. female with diagnosis of Parkinson's disease (2010).  Pt with PMH that includes:  Fibromyalgia, L bundle branch block, peripheral neuropathy, hyperlipidemia, premature ventricular contractions.  Pt enjoys traveling and is planning to begin PWR! moves class this week and yoga class.  Pt had injury to R shoulder approx 2-3 months ago during [employer] and is no longer participating in that class.  Pt has concerns about her balance and R shoulder pain for ADLs/IADLs,   Occupational performance deficits (Please refer to evaluation for details): ADL's;IADL's;Leisure;Social Participation   Rehab Potential Good   OT Frequency 2x /  week   OT Duration 6 weeks  +eval (or 13 visits) as frequency may be modified due to pt's scheduling   OT Treatment/Interventions Self-care/ADL training;Moist Heat;DME and/or AE instruction;Splinting;Patient/family education;Balance training;Therapeutic exercises;Ultrasound;Therapeutic exercise;Therapeutic activities;Passive range of motion;Functional Mobility Training;Neuromuscular  education;Cryotherapy;Energy conservation;Manual Therapy;Cognitive remediation/compensation   Plan practice squatting with focus on large base of support, functional reaching; assess writing and write goal prn   Clinical Decision Making Several treatment options, min-mod task modification necessary   Recommended Other Services recently complete ortho PT 02/2017   Consulted and Agree with Plan of Care Patient      Patient will benefit from skilled therapeutic intervention in order to improve the following deficits and impairments:  Decreased cognition, Decreased balance, Decreased activity tolerance, Decreased coordination, Decreased safety awareness, Impaired tone, Pain, Impaired UE functional use, Decreased knowledge of use of DME, Decreased mobility (bradykinesia/hypokinesia)  Visit Diagnosis: Other symptoms and signs involving the nervous system  Other symptoms and signs involving the musculoskeletal system  Other lack of coordination  Unsteadiness on feet  Other abnormalities of gait and mobility  Attention and concentration deficit  Acute pain of right shoulder  Abnormal posture      G-Codes - 04-25-17 1529    Functional Assessment Tool Used (Outpatient only) R shoulder pain 2/10 today, falls with squatting to retrieve items from floor, LOB with turning   Functional Limitation Self care   Self Care Current Status (M0867) At least 20 percent but less than 40 percent impaired, limited or restricted   Self Care Goal Status (Y1950) At least 1 percent but less than 20 percent impaired, limited or restricted      Problem List Patient Active Problem List   Diagnosis Date Noted  . LBBB (left bundle branch block) 12/25/2015  . PVCs (premature ventricular contractions) 12/25/2015  . Hyperlipidemia 12/25/2015  . Fibromyalgia 01/16/2014  . Cholelithiasis 04/25/2013  . Paralysis agitans ([place]) 01/15/2013  . Unspecified hereditary and idiopathic peripheral neuropathy 01/15/2013     [facility] 04/25/2017, 3:30 PM  [facility] [address] [place], [place], [address] Phone: [phone]   Fax:  [phone]  Name: Morgan [name] MRN: [id number] Date of Birth: 1951/03/02   Vianne Bulls, OTR/L [facility] [address] [place], [place]  [address] [phone] phone [phone] 04/25/17 3:31 PM

## 2017-04-13 DIAGNOSIS — B360 Pityriasis versicolor: Secondary | ICD-10-CM | POA: Diagnosis not present

## 2017-04-13 DIAGNOSIS — L820 Inflamed seborrheic keratosis: Secondary | ICD-10-CM | POA: Diagnosis not present

## 2017-04-13 DIAGNOSIS — D2271 Melanocytic nevi of right lower limb, including hip: Secondary | ICD-10-CM | POA: Diagnosis not present

## 2017-04-13 DIAGNOSIS — Z85820 Personal history of malignant melanoma of skin: Secondary | ICD-10-CM | POA: Diagnosis not present

## 2017-04-13 DIAGNOSIS — L821 Other seborrheic keratosis: Secondary | ICD-10-CM | POA: Diagnosis not present

## 2017-04-14 ENCOUNTER — Ambulatory Visit: Payer: Medicare Other | Admitting: Occupational Therapy

## 2017-04-14 DIAGNOSIS — R29818 Other symptoms and signs involving the nervous system: Secondary | ICD-10-CM | POA: Diagnosis not present

## 2017-04-14 DIAGNOSIS — R278 Other lack of coordination: Secondary | ICD-10-CM | POA: Diagnosis not present

## 2017-04-14 DIAGNOSIS — R2681 Unsteadiness on feet: Secondary | ICD-10-CM | POA: Diagnosis not present

## 2017-04-14 DIAGNOSIS — R29898 Other symptoms and signs involving the musculoskeletal system: Secondary | ICD-10-CM | POA: Diagnosis not present

## 2017-04-14 DIAGNOSIS — R41840 Attention and concentration deficit: Secondary | ICD-10-CM

## 2017-04-14 DIAGNOSIS — R2689 Other abnormalities of gait and mobility: Secondary | ICD-10-CM | POA: Diagnosis not present

## 2017-04-14 DIAGNOSIS — M25511 Pain in right shoulder: Secondary | ICD-10-CM

## 2017-04-14 NOTE — Therapy (Signed)
[facility] [address] [place], [place], [address] Phone: [phone]   Fax:  [phone]  Occupational Therapy Treatment  Patient Details  Name: Morgan [name] MRN: [id number] Date of Birth: 11/29/1950 Referring Provider: Dr. Wells Guiles [name]   Encounter Date: 04/14/2017      OT End of Session - 04/14/17 1253    Visit Number 2   Number of Visits 13   Date for OT Re-Evaluation 05/25/17   Authorization Time Period cert. 04/10/17-06/08/17   Authorization - Visit Number 2   Authorization - Number of Visits 10   OT Start Time 1150   OT Stop Time 1230   OT Time Calculation (min) 40 min   Activity Tolerance Patient tolerated treatment well   Behavior During Therapy Impulsive      Past Medical History:  Diagnosis Date  . Fibromyalgia   . Hypercholesteremia    controlled on medication  . Hypertension   . Melanoma ([place])   . Osteopenia   . Parkinson disease [facility])     Past Surgical History:  Procedure Laterality Date  . TONSILLECTOMY    . TUBAL LIGATION      There were no vitals filed for this visit.      Subjective Assessment - 04/14/17 1149    Pertinent History Parkinson's disease  (diagnosed 2010), Fibromyalgia, L bundle branch block, peripheral neuropathy, hyperlipidemia, premature ventricular contractions   Limitations fall risk, impulsive   Patient Stated Goals improve balance for ADLs   Currently in Pain? Yes   Pain Score 3    Pain Location Leg   Pain Orientation Right;Left   Pain Descriptors / Indicators Aching   Pain Type Chronic pain   Pain Onset More than a month ago   Pain Frequency Intermittent   Aggravating Factors  unknown   Pain Relieving Factors unknown          Treatment: Pt wrote a sentence with good legibility and letter size, pt does not need to work on handwriting at this time. Squatting/ kneeling to retrieve items from cabinets. Pt verbalized understanding of safe technique,.  She reports she has a foot stool she can sit on if needed. Dynamic step and functional reach to copy small peg design, alternating reaching with right and left hands, mod v.c/ difficulty and 1 LOB requiring therapist assist.                     OT Education - 04/14/17 1257    Education provided Yes   Education Details PWR1 basic 4 in supine, 10 reps each   Person(s) Educated Patient   Methods Explanation;Demonstration;Verbal cues   Comprehension Verbalized understanding;Returned demonstration;Verbal cues required          OT Short Term Goals - 04/10/17 1519      OT SHORT TERM GOAL #1   Title Pt will be independent with updated PD-specific HEP.--check STGs 05/11/17   Time 4   Period Weeks   Status New     OT SHORT TERM GOAL #2   Title Pt will perform PPT#4 in 15sec or less without LOB.   Baseline 13.38sec with LOB requiring 2 steps   Time 4   Period Weeks   Status New     OT SHORT TERM GOAL #3   Title Pt will be able to pick up item from floor and open lower cabinets utilizing adaptive strategies without LOB.   Time 4   Period Weeks   Status New  OT Long Term Goals - 04/10/17 1522      OT LONG TERM GOAL #1   Title Pt will verbalize understanding of adaptive strategies/AE to incr ease/safety and decr R shoulder pain with ADLs/IADLs.--check LTGs 05/25/17   Time 6   Period Weeks   Status New     OT LONG TERM GOAL #2   Title Pt will verbalize understanding of ways to prevent future complications (including shoulder pain) related to PD.   Time 6   Period Weeks   Status New     OT LONG TERM GOAL #3   Title Pt will improve coordination/functional reaching for ADLs as shown by improving score on box and blocks test by at least 4 with LUE.   Baseline L-46 blocks   Time 6   Period Weeks   Status New     OT LONG TERM GOAL #4   Title Pt will report R shoulder pain less than or equal to 3/10 for ADLs/IADLs.   Baseline fluctuates   Time 6    Period Weeks   Status New     OT LONG TERM GOAL #5   Title Pt will demo turning strategy prn during ambulation/functional tasks to incr safety/decr fall risk.   Baseline LOB with turning x2 during eval   Time 6   Period Weeks   Status New               Plan - 04/14/17 1256    Clinical Impression Statement Pt is progressing towards goals. She demonstrates continued challenges when performing a physical task with a cognitive component.   Rehab Potential Good   OT Duration 6 weeks   OT Treatment/Interventions Self-care/ADL training;Moist Heat;DME and/or AE instruction;Splinting;Patient/family education;Balance training;Therapeutic exercises;Ultrasound;Therapeutic exercise;Therapeutic activities;Passive range of motion;Functional Mobility Training;Neuromuscular education;Cryotherapy;Energy conservation;Manual Therapy;Cognitive remediation/compensation   Plan continue to address dynamic step and reach, dual tasking   Consulted and Agree with Plan of Care Patient      Patient will benefit from skilled therapeutic intervention in order to improve the following deficits and impairments:  Decreased cognition, Decreased balance, Decreased activity tolerance, Decreased coordination, Decreased safety awareness, Impaired tone, Pain, Impaired UE functional use, Decreased knowledge of use of DME, Decreased mobility  Visit Diagnosis: Other symptoms and signs involving the nervous system  Other symptoms and signs involving the musculoskeletal system  Other lack of coordination  Unsteadiness on feet  Other abnormalities of gait and mobility  Attention and concentration deficit  Acute pain of right shoulder    Problem List Patient Active Problem List   Diagnosis Date Noted  . LBBB (left bundle branch block) 12/25/2015  . PVCs (premature ventricular contractions) 12/25/2015  . Hyperlipidemia 12/25/2015  . Fibromyalgia 01/16/2014  . Cholelithiasis 04/25/2013  . Paralysis agitans  ([place]) 01/15/2013  . Unspecified hereditary and idiopathic peripheral neuropathy 01/15/2013    Aliou Mealey 04/14/2017, 12:58 PM  [facility] [address] [place], [place], [address] Phone: [phone]   Fax:  [phone]  Name: Mariaeduarda Defranco MRN: [id number] Date of Birth: 02/28/1951

## 2017-04-18 ENCOUNTER — Ambulatory Visit: Payer: Medicare Other | Admitting: Occupational Therapy

## 2017-04-18 DIAGNOSIS — R29818 Other symptoms and signs involving the nervous system: Secondary | ICD-10-CM | POA: Diagnosis not present

## 2017-04-18 DIAGNOSIS — R41840 Attention and concentration deficit: Secondary | ICD-10-CM | POA: Diagnosis not present

## 2017-04-18 DIAGNOSIS — R2681 Unsteadiness on feet: Secondary | ICD-10-CM | POA: Diagnosis not present

## 2017-04-18 DIAGNOSIS — R29898 Other symptoms and signs involving the musculoskeletal system: Secondary | ICD-10-CM

## 2017-04-18 DIAGNOSIS — M25511 Pain in right shoulder: Secondary | ICD-10-CM

## 2017-04-18 DIAGNOSIS — R2689 Other abnormalities of gait and mobility: Secondary | ICD-10-CM | POA: Diagnosis not present

## 2017-04-18 DIAGNOSIS — R278 Other lack of coordination: Secondary | ICD-10-CM

## 2017-04-18 NOTE — Therapy (Signed)
[facility] [address] [place], [place], [address] Phone: [phone]   Fax:  [phone]  Occupational Therapy Treatment  Patient Details  Name: Morgan [name] MRN: [id number] Date of Birth: 1951-01-03 Referring Provider: Dr. Wells Guiles [name]   Encounter Date: 04/18/2017      OT End of Session - 04/18/17 1648    Visit Number 3   Number of Visits 13   Date for OT Re-Evaluation 05/25/17   Authorization Type Medicare/BCBS, G-code needed   Authorization Time Period cert. 04/10/17-06/08/17   Authorization - Visit Number 3   Authorization - Number of Visits 10      Past Medical History:  Diagnosis Date  . Fibromyalgia   . Hypercholesteremia    controlled on medication  . Hypertension   . Melanoma ([place])   . Osteopenia   . Parkinson disease [facility])     Past Surgical History:  Procedure Laterality Date  . TONSILLECTOMY    . TUBAL LIGATION      There were no vitals filed for this visit.      Subjective Assessment - 04/18/17 1629    Subjective  (P)  Pt reports she has a cold and her shoulder is hurting today   Pertinent History (P)  Parkinson's disease  (diagnosed 2010), Fibromyalgia, L bundle branch block, peripheral neuropathy, hyperlipidemia, premature ventricular contractions   Limitations (P)  fall risk, impulsive   Patient Stated Goals (P)  improve balance for ADLs             Treatment: PWR! Seated for PWR! up, rock and twist 10 reps each, min v.c for positioning Hotpack applied to left shoulder x 10 mins due to pain, no adverse reactions, while pt performed fine motor coordination activities with bilateral UE's, flipping/ dealing cards, rotating ball and picking up/ stacking coins, min v.c for big movements and mod v.c for posture. Shoulder rolls and neck rotation due to stiffness.                    OT Short Term Goals - 04/10/17 1519      OT SHORT TERM GOAL #1   Title Pt will  be independent with updated PD-specific HEP.--check STGs 05/11/17   Time 4   Period Weeks   Status New     OT SHORT TERM GOAL #2   Title Pt will perform PPT#4 in 15sec or less without LOB.   Baseline 13.38sec with LOB requiring 2 steps   Time 4   Period Weeks   Status New     OT SHORT TERM GOAL #3   Title Pt will be able to pick up item from floor and open lower cabinets utilizing adaptive strategies without LOB.   Time 4   Period Weeks   Status New           OT Long Term Goals - 04/10/17 1522      OT LONG TERM GOAL #1   Title Pt will verbalize understanding of adaptive strategies/AE to incr ease/safety and decr R shoulder pain with ADLs/IADLs.--check LTGs 05/25/17   Time 6   Period Weeks   Status New     OT LONG TERM GOAL #2   Title Pt will verbalize understanding of ways to prevent future complications (including shoulder pain) related to PD.   Time 6   Period Weeks   Status New     OT LONG TERM GOAL #3   Title Pt will improve coordination/functional reaching for  ADLs as shown by improving score on box and blocks test by at least 4 with LUE.   Baseline L-46 blocks   Time 6   Period Weeks   Status New     OT LONG TERM GOAL #4   Title Pt will report R shoulder pain less than or equal to 3/10 for ADLs/IADLs.   Baseline fluctuates   Time 6   Period Weeks   Status New     OT LONG TERM GOAL #5   Title Pt will demo turning strategy prn during ambulation/functional tasks to incr safety/decr fall risk.   Baseline LOB with turning x2 during eval   Time 6   Period Weeks   Status New               Plan - 04/18/17 1650    Clinical Impression Statement Pt is progressing towards goals, she remains limited by shoulder pain and decreased body awareness.   Rehab Potential Good   OT Frequency 2x / week   OT Duration 6 weeks   OT Treatment/Interventions Self-care/ADL training;Moist Heat;DME and/or AE instruction;Splinting;Patient/family education;Balance  training;Therapeutic exercises;Ultrasound;Therapeutic exercise;Therapeutic activities;Passive range of motion;Functional Mobility Training;Neuromuscular education;Cryotherapy;Energy conservation;Manual Therapy;Cognitive remediation/compensation   Plan dual tasking, address shoulder pain   Consulted and Agree with Plan of Care Patient      Patient will benefit from skilled therapeutic intervention in order to improve the following deficits and impairments:  Decreased cognition, Decreased balance, Decreased activity tolerance, Decreased coordination, Decreased safety awareness, Impaired tone, Pain, Impaired UE functional use, Decreased knowledge of use of DME, Decreased mobility  Visit Diagnosis: Other symptoms and signs involving the nervous system  Other symptoms and signs involving the musculoskeletal system  Other lack of coordination  Attention and concentration deficit  Acute pain of right shoulder    Problem List Patient Active Problem List   Diagnosis Date Noted  . LBBB (left bundle branch block) 12/25/2015  . PVCs (premature ventricular contractions) 12/25/2015  . Hyperlipidemia 12/25/2015  . Fibromyalgia 01/16/2014  . Cholelithiasis 04/25/2013  . Paralysis agitans ([place]) 01/15/2013  . Unspecified hereditary and idiopathic peripheral neuropathy 01/15/2013    RINE,KATHRYN 04/18/2017, 4:52 PM  [facility] [address] [place], [place], [address] Phone: [phone]   Fax:  [phone]  Name: Morgan [name] MRN: [id number] Date of Birth: [birth year]

## 2017-04-19 DIAGNOSIS — H353211 Exudative age-related macular degeneration, right eye, with active choroidal neovascularization: Secondary | ICD-10-CM | POA: Diagnosis not present

## 2017-04-19 DIAGNOSIS — H353121 Nonexudative age-related macular degeneration, left eye, early dry stage: Secondary | ICD-10-CM | POA: Diagnosis not present

## 2017-04-19 DIAGNOSIS — H43813 Vitreous degeneration, bilateral: Secondary | ICD-10-CM | POA: Diagnosis not present

## 2017-04-20 ENCOUNTER — Ambulatory Visit: Payer: Medicare Other | Admitting: Occupational Therapy

## 2017-04-20 DIAGNOSIS — R2681 Unsteadiness on feet: Secondary | ICD-10-CM | POA: Diagnosis not present

## 2017-04-20 DIAGNOSIS — R293 Abnormal posture: Secondary | ICD-10-CM

## 2017-04-20 DIAGNOSIS — R29818 Other symptoms and signs involving the nervous system: Secondary | ICD-10-CM

## 2017-04-20 DIAGNOSIS — R2689 Other abnormalities of gait and mobility: Secondary | ICD-10-CM | POA: Diagnosis not present

## 2017-04-20 DIAGNOSIS — R41840 Attention and concentration deficit: Secondary | ICD-10-CM

## 2017-04-20 DIAGNOSIS — R278 Other lack of coordination: Secondary | ICD-10-CM

## 2017-04-20 DIAGNOSIS — M25511 Pain in right shoulder: Secondary | ICD-10-CM

## 2017-04-20 DIAGNOSIS — R29898 Other symptoms and signs involving the musculoskeletal system: Secondary | ICD-10-CM

## 2017-04-20 NOTE — Therapy (Signed)
[facility] [address] [place], [place], [address] Phone: [phone]   Fax:  [phone]  Occupational Therapy Treatment  Patient Details  Name: Morgan [name] MRN: [id number] Date of Birth: [birth year] Referring Provider: Dr. Wells Guiles [name]   Encounter Date: 04/20/2017      OT End of Session - 04/20/17 1539    Visit Number 4   Number of Visits 13   Date for OT Re-Evaluation 05/25/17   Authorization Type Medicare/BCBS, G-code needed   Authorization Time Period cert. 04/10/17-06/08/17   Authorization - Visit Number 4   Authorization - Number of Visits 10   OT Start Time 1450   OT Stop Time 1532   OT Time Calculation (min) 42 min   Activity Tolerance Patient tolerated treatment well   Behavior During Therapy WFL for tasks assessed/performed      Past Medical History:  Diagnosis Date  . Fibromyalgia   . Hypercholesteremia    controlled on medication  . Hypertension   . Melanoma ([place])   . Osteopenia   . Parkinson disease [facility])     Past Surgical History:  Procedure Laterality Date  . TONSILLECTOMY    . TUBAL LIGATION      There were no vitals filed for this visit.      Subjective Assessment - 04/20/17 1452    Subjective  Pt reports she has a cold and her shoulder is hurting today.  [facility]'t [facility] due to eye appt.   Pertinent History Parkinson's disease  (diagnosed 2010), Fibromyalgia, L bundle branch block, peripheral neuropathy, hyperlipidemia, premature ventricular contractions, macular degeneration   Limitations fall risk, impulsive   Patient Stated Goals improve balance for ADLs   Currently in Pain? Yes   Pain Score 4    Pain Location Shoulder   Pain Orientation Right   Pain Descriptors / Indicators Aching   Pain Type Chronic pain   Pain Onset More than a month ago   Pain Frequency Intermittent   Aggravating Factors  pressing down on sofa to get up   Pain Relieving Factors unknown      PWR! Moves (basic 4) in supine x 20 each with min cues For incr movement amplitude/technique.  Arm bike x51min level 1 for reciprocal movement with cues for proper shoulder positioning and posture.  Educated pt in large amplitude movement strategies for sit>stand and stand>sit to decr reliance of UEs for transfer due to reports of pain with RUE use for sit>stand.  Pt needed repetition and min-mod cueing, but improved with repetition.  In supine, scapular depression with min cueing.                         OT Education - 04/20/17 1537    Education Details Reviewed PWR! supine (basic 4) x20 each with min-mod cueing for large amplitude/technique.  Instructed pt to perform these daily to help with shoulder pain.  Educated pt in typical factors that contribute to shoulder pain with PD and how PWR! moves address this (rigidity, decr posture, decr trunk movement) and then instructed pt to ensure that she is using trunk with UE movement and turning to object with reach with good posture.     Person(s) Educated Patient   Methods Explanation;Demonstration;Verbal cues   Comprehension Verbalized understanding;Returned demonstration;Verbal cues required          OT Short Term Goals - 04/10/17 1519      OT SHORT TERM GOAL #1  Title Pt will be independent with updated PD-specific HEP.--check STGs 05/11/17   Time 4   Period Weeks   Status New     OT SHORT TERM GOAL #2   Title Pt will perform PPT#4 in 15sec or less without LOB.   Baseline 13.38sec with LOB requiring 2 steps   Time 4   Period Weeks   Status New     OT SHORT TERM GOAL #3   Title Pt will be able to pick up item from floor and open lower cabinets utilizing adaptive strategies without LOB.   Time 4   Period Weeks   Status New           OT Long Term Goals - 04/10/17 1522      OT LONG TERM GOAL #1   Title Pt will verbalize understanding of adaptive strategies/AE to incr ease/safety and decr R  shoulder pain with ADLs/IADLs.--check LTGs 05/25/17   Time 6   Period Weeks   Status New     OT LONG TERM GOAL #2   Title Pt will verbalize understanding of ways to prevent future complications (including shoulder pain) related to PD.   Time 6   Period Weeks   Status New     OT LONG TERM GOAL #3   Title Pt will improve coordination/functional reaching for ADLs as shown by improving score on box and blocks test by at least 4 with LUE.   Baseline L-46 blocks   Time 6   Period Weeks   Status New     OT LONG TERM GOAL #4   Title Pt will report R shoulder pain less than or equal to 3/10 for ADLs/IADLs.   Baseline fluctuates   Time 6   Period Weeks   Status New     OT LONG TERM GOAL #5   Title Pt will demo turning strategy prn during ambulation/functional tasks to incr safety/decr fall risk.   Baseline LOB with turning x2 during eval   Time 6   Period Weeks   Status New               Plan - 04/20/17 1540    Clinical Impression Statement Pt is progressing towards goals and reports decr shoulder pain within session with proper postitioning, stretching, and incr associated trunk movements.  Pt does need cueing for this due to decr body awareness.   Rehab Potential Good   Current Impairments/barriers affecting progress: cognitive deficits   OT Frequency 2x / week   OT Duration 6 weeks   OT Treatment/Interventions Self-care/ADL training;Moist Heat;DME and/or AE instruction;Splinting;Patient/family education;Balance training;Therapeutic exercises;Ultrasound;Therapeutic exercise;Therapeutic activities;Passive range of motion;Functional Mobility Training;Neuromuscular education;Cryotherapy;Energy conservation;Manual Therapy;Cognitive remediation/compensation   Plan continue to address shoulder pain through incr functional mobility (decr reliance of UEs), proper positioning, stretching, and associated trunk movements   Consulted and Agree with Plan of Care Patient      Patient  will benefit from skilled therapeutic intervention in order to improve the following deficits and impairments:  Decreased cognition, Decreased balance, Decreased activity tolerance, Decreased coordination, Decreased safety awareness, Impaired tone, Pain, Impaired UE functional use, Decreased knowledge of use of DME, Decreased mobility  Visit Diagnosis: Other symptoms and signs involving the nervous system  Other symptoms and signs involving the musculoskeletal system  Other lack of coordination  Attention and concentration deficit  Acute pain of right shoulder  Unsteadiness on feet  Other abnormalities of gait and mobility  Abnormal posture    Problem List Patient Active Problem  List   Diagnosis Date Noted  . LBBB (left bundle branch block) 12/25/2015  . PVCs (premature ventricular contractions) 12/25/2015  . Hyperlipidemia 12/25/2015  . Fibromyalgia 01/16/2014  . Cholelithiasis 04/25/2013  . Paralysis agitans ([place]) 01/15/2013  . Unspecified hereditary and idiopathic peripheral neuropathy 01/15/2013    [facility] 04/20/2017, 3:42 PM  [facility] [address] [place], [place], [address] Phone: [phone]   Fax:  [phone]  Name: Clarissa Laird MRN: [id number] Date of Birth: 1950/12/28   Vianne Bulls, OTR/L [facility] [address] [place], [place]  [address] [phone] phone [phone] 04/20/17 3:43 PM

## 2017-04-26 ENCOUNTER — Ambulatory Visit: Payer: Medicare Other | Admitting: Occupational Therapy

## 2017-04-26 DIAGNOSIS — R41840 Attention and concentration deficit: Secondary | ICD-10-CM

## 2017-04-26 DIAGNOSIS — R29818 Other symptoms and signs involving the nervous system: Secondary | ICD-10-CM | POA: Diagnosis not present

## 2017-04-26 DIAGNOSIS — R278 Other lack of coordination: Secondary | ICD-10-CM

## 2017-04-26 DIAGNOSIS — R29898 Other symptoms and signs involving the musculoskeletal system: Secondary | ICD-10-CM | POA: Diagnosis not present

## 2017-04-26 DIAGNOSIS — R2681 Unsteadiness on feet: Secondary | ICD-10-CM | POA: Diagnosis not present

## 2017-04-26 DIAGNOSIS — R2689 Other abnormalities of gait and mobility: Secondary | ICD-10-CM | POA: Diagnosis not present

## 2017-04-26 NOTE — Therapy (Signed)
[facility] [address] [place], [place], [address] Phone: [phone]   Fax:  [phone]  Occupational Therapy Treatment  Patient Details  Name: Morgan [name] MRN: [id number] Date of Birth: [birth year] Referring Provider: Dr. Wells Guiles [name]   Encounter Date: 04/26/2017      OT End of Session - 04/26/17 1159    Visit Number 4   Number of Visits 13   Date for OT Re-Evaluation 05/25/17   Authorization Type Medicare/BCBS, G-code needed   Authorization Time Period cert. 04/10/17-06/08/17   Authorization - Visit Number 5   Authorization - Number of Visits 10   OT Start Time 1150   OT Stop Time 1230   OT Time Calculation (min) 40 min   Activity Tolerance Patient tolerated treatment well   Behavior During Therapy WFL for tasks assessed/performed      Past Medical History:  Diagnosis Date  . Fibromyalgia   . Hypercholesteremia    controlled on medication  . Hypertension   . Melanoma ([place])   . Osteopenia   . Parkinson disease [facility])     Past Surgical History:  Procedure Laterality Date  . TONSILLECTOMY    . TUBAL LIGATION      There were no vitals filed for this visit.      Subjective Assessment - 04/26/17 1152    Subjective  Pt reports bilateral leg pain   Pertinent History Parkinson's disease  (diagnosed 2010), Fibromyalgia, L bundle branch block, peripheral neuropathy, hyperlipidemia, premature ventricular contractions, macular degeneration   Limitations fall risk, impulsive   Patient Stated Goals improve balance for ADLs   Currently in Pain? Yes   Pain Score 6    Pain Location Leg   Pain Orientation Right;Left   Pain Descriptors / Indicators Aching   Pain Type Chronic pain   Pain Onset More than a month ago   Pain Frequency Intermittent   Aggravating Factors  unknown   Pain Relieving Factors unknown   Multiple Pain Sites No             Treatment: Nustep as warm up x 5 mins, min v.c for  performance and big arm movements/ elbow  extension while stepping with feet. Pt reports she is particularly stiff and not moving well today. Supine PWR! Basic 4, 10 reps each, min v.c for positioning, larger amplitude movements Chest press and shoulder flexion in supine with bilateral UE's min v.c for right shoulder positioning. Sitting edge of mat, pt performed sit to stand using PWR! up x 7 reps, min-mod v.c, and several LOB backwards requiring facilitation. Placing grooved pegs in pegboard with LUE , mod-max difficulty , min v.c. Pt forgot to take her 12 pm does of sinemet.                   OT Short Term Goals - 04/10/17 1519      OT SHORT TERM GOAL #1   Title Pt will be independent with updated PD-specific HEP.--check STGs 05/11/17   Time 4   Period Weeks   Status New     OT SHORT TERM GOAL #2   Title Pt will perform PPT#4 in 15sec or less without LOB.   Baseline 13.38sec with LOB requiring 2 steps   Time 4   Period Weeks   Status New     OT SHORT TERM GOAL #3   Title Pt will be able to pick up item from floor and open lower cabinets utilizing adaptive strategies without  LOB.   Time 4   Period Weeks   Status New           OT Long Term Goals - 04/10/17 1522      OT LONG TERM GOAL #1   Title Pt will verbalize understanding of adaptive strategies/AE to incr ease/safety and decr R shoulder pain with ADLs/IADLs.--check LTGs 05/25/17   Time 6   Period Weeks   Status New     OT LONG TERM GOAL #2   Title Pt will verbalize understanding of ways to prevent future complications (including shoulder pain) related to PD.   Time 6   Period Weeks   Status New     OT LONG TERM GOAL #3   Title Pt will improve coordination/functional reaching for ADLs as shown by improving score on box and blocks test by at least 4 with LUE.   Baseline L-46 blocks   Time 6   Period Weeks   Status New     OT LONG TERM GOAL #4   Title Pt will report R shoulder pain less than or  equal to 3/10 for ADLs/IADLs.   Baseline fluctuates   Time 6   Period Weeks   Status New     OT LONG TERM GOAL #5   Title Pt will demo turning strategy prn during ambulation/functional tasks to incr safety/decr fall risk.   Baseline LOB with turning x2 during eval   Time 6   Period Weeks   Status New               Plan - 04/26/17 1228    Clinical Impression Statement (P)  Pt is progressing towards goals. she remains limited by right shoulder pain and LE pain today.   Rehab Potential (P)  Good   Current Impairments/barriers affecting progress: (P)  cognitive deficits   OT Frequency (P)  2x / week   OT Duration (P)  6 weeks      Patient will benefit from skilled therapeutic intervention in order to improve the following deficits and impairments:  (P) Decreased cognition, Decreased balance, Decreased activity tolerance, Decreased coordination, Decreased safety awareness, Impaired tone, Pain, Impaired UE functional use, Decreased knowledge of use of DME, Decreased mobility  Visit Diagnosis: Other symptoms and signs involving the nervous system  Other symptoms and signs involving the musculoskeletal system  Other lack of coordination  Attention and concentration deficit    Problem List Patient Active Problem List   Diagnosis Date Noted  . LBBB (left bundle branch block) 12/25/2015  . PVCs (premature ventricular contractions) 12/25/2015  . Hyperlipidemia 12/25/2015  . Fibromyalgia 01/16/2014  . Cholelithiasis 04/25/2013  . Paralysis agitans ([place]) 01/15/2013  . Unspecified hereditary and idiopathic peripheral neuropathy 01/15/2013    RINE,KATHRYN 04/26/2017, 12:48 PM  [facility] [address] [place], [place], [address] Phone: [phone]   Fax:  [phone]  Name: Morgan [name] MRN: [id number] Date of Birth: Mar 11, 1951

## 2017-04-28 ENCOUNTER — Telehealth: Payer: Self-pay | Admitting: Neurology

## 2017-04-28 MED ORDER — CARBIDOPA-LEVODOPA 25-100 MG PO TABS: ORAL_TABLET | ORAL | 1 refills | Status: DC

## 2017-04-28 NOTE — Telephone Encounter (Signed)
Spoke with patient. Made her aware we have not received a request for Levodopa. We received request for Pramipexole which was sent on 04/10/17. RX sent as requested.

## 2017-04-28 NOTE — Telephone Encounter (Signed)
Patient states that she is out of the  Carbidopa levodopa medication and the pharmacy sent something to [place] but never heard anything back from [place] she needs that medication refilled

## 2017-05-02 ENCOUNTER — Ambulatory Visit: Payer: Medicare Other | Attending: Family Medicine | Admitting: Occupational Therapy

## 2017-05-02 DIAGNOSIS — R278 Other lack of coordination: Secondary | ICD-10-CM | POA: Diagnosis not present

## 2017-05-02 DIAGNOSIS — R2689 Other abnormalities of gait and mobility: Secondary | ICD-10-CM

## 2017-05-02 DIAGNOSIS — R41840 Attention and concentration deficit: Secondary | ICD-10-CM | POA: Diagnosis not present

## 2017-05-02 DIAGNOSIS — R2681 Unsteadiness on feet: Secondary | ICD-10-CM | POA: Diagnosis not present

## 2017-05-02 DIAGNOSIS — M25511 Pain in right shoulder: Secondary | ICD-10-CM | POA: Diagnosis not present

## 2017-05-02 DIAGNOSIS — R29898 Other symptoms and signs involving the musculoskeletal system: Secondary | ICD-10-CM

## 2017-05-02 DIAGNOSIS — R293 Abnormal posture: Secondary | ICD-10-CM

## 2017-05-02 DIAGNOSIS — R29818 Other symptoms and signs involving the nervous system: Secondary | ICD-10-CM

## 2017-05-02 NOTE — Therapy (Signed)
[facility] [address] [place], [place], [address] Phone: [phone]   Fax:  [phone]  Occupational Therapy Treatment  Patient Details  Name: Morgan [name] MRN: [id number] Date of Birth: 02/19/1951 Referring Provider: Dr. Wells Guiles [name]   Encounter Date: 05/02/2017      OT End of Session - 05/02/17 1108    Visit Number 6   Number of Visits 13   Date for OT Re-Evaluation 05/25/17   Authorization Type Medicare/BCBS, G-code needed   Authorization Time Period cert. 04/10/17-06/08/17   Authorization - Visit Number 6   Authorization - Number of Visits 10   OT Start Time 1106   OT Stop Time 1145   OT Time Calculation (min) 39 min   Activity Tolerance Patient tolerated treatment well   Behavior During Therapy WFL for tasks assessed/performed      Past Medical History:  Diagnosis Date  . Fibromyalgia   . Hypercholesteremia    controlled on medication  . Hypertension   . Melanoma ([address])   . Osteopenia   . Parkinson disease [facility])     Past Surgical History:  Procedure Laterality Date  . TONSILLECTOMY    . TUBAL LIGATION      There were no vitals filed for this visit.      Subjective Assessment - 05/02/17 1106    Subjective  Feel pretty good today   Pertinent History Parkinson's disease  (diagnosed 2010), Fibromyalgia, L bundle branch block, peripheral neuropathy, hyperlipidemia, premature ventricular contractions, macular degeneration   Limitations fall risk, impulsive   Patient Stated Goals improve balance for ADLs   Currently in Pain? Yes   Pain Score 2    Pain Location --  quads, R side of neck   Pain Descriptors / Indicators Aching   Pain Type Chronic pain   Pain Onset More than a month ago   Pain Frequency Intermittent   Aggravating Factors  unknown   Pain Relieving Factors tape for R neck       PWR! Moves (basic 4) in supine x 10-20 each with min cues For incr movement amplitude/technique.   Issued handout for home.  Recommended pt perform these everyday.   In sitting, flipping cards with each hand and dealing cards with each hand with min cueing for shoulder hike/positioning to prevent pain and large amplitude movement techniques.  PWR! Multi-directional Movements:    Stepping to given sequence with min-mod difficulty/cues for dual task/cognitive component initially.   Followed by multi-directional reaching with stepping with min-mod difficulty/cues for dual task/cognitive component, improved with repetition.      Self Care:    Continued instruction in importance and  in use of large amplitude movements, slowing down, and avoid shoulder to prevent future complications related to PD (including falls/shoulder pain).  Pt verbalized understanding.  Pt instructed in importance of correct timing of meds to decr risk of dyskinesias as pt with incr fall risk and incr risk of shoulder pain related to dyskinesias due to abnormal shoulder positioning and impulsivity as well as crossing feet with incr dyskinesias.  (pt reports that she takes meds related to how she feels vs. Timing at times, but that MD is aware).  Pt verbalized understanding.   Functional mobility:   Ambulating to practice direction changes (after instruction/review of strategy) while carrying bowl in both hands with min cueing to slow down and with no LOB.   Sit>stand and stand>sit with min cues for large amplitude movement technique.  Practiced squatting technique  with functional reaching to safely retrieve items from lower cabinets and place in overhead cabinets.  Pt returned demo after review.                            OT Short Term Goals - 04/10/17 1519      OT SHORT TERM GOAL #1   Title Pt will be independent with updated PD-specific HEP.--check STGs 05/11/17   Time 4   Period Weeks   Status New     OT SHORT TERM GOAL #2   Title Pt will perform PPT#4 in 15sec or less without LOB.    Baseline 13.38sec with LOB requiring 2 steps   Time 4   Period Weeks   Status New     OT SHORT TERM GOAL #3   Title Pt will be able to pick up item from floor and open lower cabinets utilizing adaptive strategies without LOB.   Time 4   Period Weeks   Status New           OT Long Term Goals - 04/10/17 1522      OT LONG TERM GOAL #1   Title Pt will verbalize understanding of adaptive strategies/AE to incr ease/safety and decr R shoulder pain with ADLs/IADLs.--check LTGs 05/25/17   Time 6   Period Weeks   Status New     OT LONG TERM GOAL #2   Title Pt will verbalize understanding of ways to prevent future complications (including shoulder pain) related to PD.   Time 6   Period Weeks   Status New     OT LONG TERM GOAL #3   Title Pt will improve coordination/functional reaching for ADLs as shown by improving score on box and blocks test by at least 4 with LUE.   Baseline L-46 blocks   Time 6   Period Weeks   Status New     OT LONG TERM GOAL #4   Title Pt will report R shoulder pain less than or equal to 3/10 for ADLs/IADLs.   Baseline fluctuates   Time 6   Period Weeks   Status New     OT LONG TERM GOAL #5   Title Pt will demo turning strategy prn during ambulation/functional tasks to incr safety/decr fall risk.   Baseline LOB with turning x2 during eval   Time 6   Period Weeks   Status New               Plan - 05/02/17 1135    Clinical Impression Statement Pt is progressing towards goals and reports decr shoulder pain during session with proper positioning.   Rehab Potential Good   Current Impairments/barriers affecting progress: cognitive deficits   OT Frequency 2x / week   OT Duration 6 weeks   OT Treatment/Interventions Self-care/ADL training;Moist Heat;DME and/or AE instruction;Splinting;Patient/family education;Balance training;Therapeutic exercises;Ultrasound;Therapeutic exercise;Therapeutic activities;Passive range of motion;Functional Mobility  Training;Neuromuscular education;Cryotherapy;Energy conservation;Manual Therapy;Cognitive remediation/compensation   Plan continue to address shoulder pain through functional mobility (decr reliance of on UEs), proper positioning and associated trunk movements.   OT Home Exercise Plan Education Provided:  Supine PWR! (basic 4)   Consulted and Agree with Plan of Care Patient      Patient will benefit from skilled therapeutic intervention in order to improve the following deficits and impairments:  Decreased cognition, Decreased balance, Decreased activity tolerance, Decreased coordination, Decreased safety awareness, Impaired tone, Pain, Impaired UE functional use, Decreased knowledge of use of  DME, Decreased mobility  Visit Diagnosis: Other symptoms and signs involving the nervous system  Other symptoms and signs involving the musculoskeletal system  Other lack of coordination  Attention and concentration deficit  Acute pain of right shoulder  Unsteadiness on feet  Other abnormalities of gait and mobility  Abnormal posture    Problem List Patient Active Problem List   Diagnosis Date Noted  . LBBB (left bundle branch block) 12/25/2015  . PVCs (premature ventricular contractions) 12/25/2015  . Hyperlipidemia 12/25/2015  . Fibromyalgia 01/16/2014  . Cholelithiasis 04/25/2013  . Paralysis agitans ([place]) 01/15/2013  . Unspecified hereditary and idiopathic peripheral neuropathy 01/15/2013    [facility] 05/02/2017, 12:48 PM  [facility] [address] [place], [place], [address] Phone: [phone]   Fax:  [phone]  Name: Eboni Coval MRN: [id number] Date of Birth: 1951-06-09   Vianne Bulls, OTR/L [facility] [address] [place], [place]  [address] [phone] phone [phone] 05/02/17 12:48 PM

## 2017-05-15 ENCOUNTER — Ambulatory Visit: Payer: Medicare Other | Admitting: Occupational Therapy

## 2017-05-15 DIAGNOSIS — Z23 Encounter for immunization: Secondary | ICD-10-CM | POA: Diagnosis not present

## 2017-05-18 ENCOUNTER — Ambulatory Visit: Payer: Medicare Other | Admitting: Occupational Therapy

## 2017-05-18 DIAGNOSIS — R41840 Attention and concentration deficit: Secondary | ICD-10-CM | POA: Diagnosis not present

## 2017-05-18 DIAGNOSIS — R29898 Other symptoms and signs involving the musculoskeletal system: Secondary | ICD-10-CM

## 2017-05-18 DIAGNOSIS — R29818 Other symptoms and signs involving the nervous system: Secondary | ICD-10-CM | POA: Diagnosis not present

## 2017-05-18 DIAGNOSIS — R2689 Other abnormalities of gait and mobility: Secondary | ICD-10-CM

## 2017-05-18 DIAGNOSIS — R2681 Unsteadiness on feet: Secondary | ICD-10-CM

## 2017-05-18 DIAGNOSIS — R293 Abnormal posture: Secondary | ICD-10-CM

## 2017-05-18 DIAGNOSIS — M25511 Pain in right shoulder: Secondary | ICD-10-CM

## 2017-05-18 DIAGNOSIS — R278 Other lack of coordination: Secondary | ICD-10-CM

## 2017-05-18 NOTE — Therapy (Signed)
[facility] [address] [place], [place], [address] Phone: [phone]   Fax:  [phone]  Occupational Therapy Treatment  Patient Details  Name: Morgan [name] MRN: [id number] Date of Birth: 04-28-1951 Referring Provider: Dr. Wells Guiles [name]   Encounter Date: 05/18/2017      OT End of Session - 05/18/17 1110    Visit Number 7   Number of Visits 13   Date for OT Re-Evaluation 05/25/17   Authorization Type Medicare/BCBS, G-code needed   Authorization Time Period cert. 04/10/17-06/08/17   Authorization - Visit Number 7   Authorization - Number of Visits 10   OT Start Time 1104   OT Stop Time 1145   OT Time Calculation (min) 41 min   Activity Tolerance Patient tolerated treatment well   Behavior During Therapy WFL for tasks assessed/performed      Past Medical History:  Diagnosis Date  . Fibromyalgia   . Hypercholesteremia    controlled on medication  . Hypertension   . Melanoma ([place])   . Osteopenia   . Parkinson disease [facility])     Past Surgical History:  Procedure Laterality Date  . TONSILLECTOMY    . TUBAL LIGATION      There were no vitals filed for this visit.      Subjective Assessment - 05/18/17 1108    Subjective  No falls.   Pertinent History Parkinson's disease  (diagnosed 2010), Fibromyalgia, L bundle branch block, peripheral neuropathy, hyperlipidemia, premature ventricular contractions, macular degeneration   Limitations fall risk, impulsive   Patient Stated Goals improve balance for ADLs   Currently in Pain? Yes   Pain Score 4    Pain Location --  R shoulder   Pain Orientation Right   Pain Descriptors / Indicators Sore   Pain Type Chronic pain   Pain Onset More than a month ago   Pain Frequency Intermittent   Aggravating Factors  ?quadraped PWR!   Pain Relieving Factors proper positioning         Pt reports incr shoulder pain recently; however, upon questioning, pt is sliding  off sofa down to floor onto RUE and using RUE to push off sofa to standing due to difficulty with scooting and sit>stand from sofa.    Sitting of various pillows/foam cushions to simulate sofa, practiced scooting forward and sit>stand with large amplitude movement techniques.  Pt with mod cueing/difficulty initially with scooting, but improved with repetition.   Also practiced getting up from chair at table using strategies including big chair scoot, scoot forward in chair and sit>stand using large amplitude movements with min-mod cueing.  Discussed progress with retrieving items from lower cabinets/floor.  Pt reports improvement and performing without LOB, but reports that she is sometimes sitting on stepstool if she has to do more activity on the floor and then may pull herself up using counter with UEs.  Recommended against pulling with UEs due to incr for shoulder pain and recommended pushing up/getting up from quadruped instead.  Also reviewed technique to squat and that pt should kneel and then push up vs. Pull.  Also recommended pt do pushing with LUE vs. RUE due to R shoulder pain.    Emphasized importance of performing transfers consistently utilizing strategies to prevent/decr R shoulder pain.  Pt verbalized understanding.                       OT Short Term Goals - 05/18/17 1203  OT SHORT TERM GOAL #1   Title Pt will be independent with updated PD-specific HEP.--check STGs 05/11/17   Time 4   Period Weeks   Status On-going     OT SHORT TERM GOAL #2   Title Pt will perform PPT#4 in 15sec or less without LOB.   Baseline 13.38sec with LOB requiring 2 steps   Time 4   Period Weeks   Status New     OT SHORT TERM GOAL #3   Title Pt will be able to pick up item from floor and open lower cabinets utilizing adaptive strategies without LOB.   Time 4   Period Weeks   Status Achieved  05/18/17           OT Long Term Goals - 04/10/17 1522      OT LONG TERM  GOAL #1   Title Pt will verbalize understanding of adaptive strategies/AE to incr ease/safety and decr R shoulder pain with ADLs/IADLs.--check LTGs 05/25/17   Time 6   Period Weeks   Status New     OT LONG TERM GOAL #2   Title Pt will verbalize understanding of ways to prevent future complications (including shoulder pain) related to PD.   Time 6   Period Weeks   Status New     OT LONG TERM GOAL #3   Title Pt will improve coordination/functional reaching for ADLs as shown by improving score on box and blocks test by at least 4 with LUE.   Baseline L-46 blocks   Time 6   Period Weeks   Status New     OT LONG TERM GOAL #4   Title Pt will report R shoulder pain less than or equal to 3/10 for ADLs/IADLs.   Baseline fluctuates   Time 6   Period Weeks   Status New     OT LONG TERM GOAL #5   Title Pt will demo turning strategy prn during ambulation/functional tasks to incr safety/decr fall risk.   Baseline LOB with turning x2 during eval   Time 6   Period Weeks   Status New               Plan - 05/18/17 1111    Clinical Impression Statement Pt reports incr shoulder pain, but decr functional mobility and improper positioning may be contributing.   Rehab Potential Good   Current Impairments/barriers affecting progress: cognitive deficits   OT Frequency 2x / week   OT Duration 6 weeks   OT Treatment/Interventions Self-care/ADL training;Moist Heat;DME and/or AE instruction;Splinting;Patient/family education;Balance training;Therapeutic exercises;Ultrasound;Therapeutic exercise;Therapeutic activities;Passive range of motion;Functional Mobility Training;Neuromuscular education;Cryotherapy;Energy conservation;Manual Therapy;Cognitive remediation/compensation   Plan anticipate renewal next week as pt has not been seen for frequency; continue to address shoulder pain through strategies for functional mobility (decr reliance on UEs), proper positioning, and associated trunk movement    OT Home Exercise Plan Education Provided:  Supine PWR! (basic 4)   Consulted and Agree with Plan of Care Patient      Patient will benefit from skilled therapeutic intervention in order to improve the following deficits and impairments:  Decreased cognition, Decreased balance, Decreased activity tolerance, Decreased coordination, Decreased safety awareness, Impaired tone, Pain, Impaired UE functional use, Decreased knowledge of use of DME, Decreased mobility  Visit Diagnosis: Other symptoms and signs involving the nervous system  Other symptoms and signs involving the musculoskeletal system  Other lack of coordination  Attention and concentration deficit  Acute pain of right shoulder  Unsteadiness on feet  Other  abnormalities of gait and mobility  Abnormal posture    Problem List Patient Active Problem List   Diagnosis Date Noted  . LBBB (left bundle branch block) 12/25/2015  . PVCs (premature ventricular contractions) 12/25/2015  . Hyperlipidemia 12/25/2015  . Fibromyalgia 01/16/2014  . Cholelithiasis 04/25/2013  . Paralysis agitans ([place]) 01/15/2013  . Unspecified hereditary and idiopathic peripheral neuropathy 01/15/2013    [facility] 05/18/2017, 12:55 PM  [facility] [address] [place], [place], [address] Phone: [phone]   Fax:  [phone]  Name: Morgan [name] MRN: [id number] Date of Birth: 12-May-1951   Vianne Bulls, OTR/L [facility] [address] [place], [place]  [address] [phone] phone [phone] 05/18/17 12:55 PM

## 2017-05-22 ENCOUNTER — Ambulatory Visit: Payer: Medicare Other | Admitting: Occupational Therapy

## 2017-05-22 DIAGNOSIS — R29898 Other symptoms and signs involving the musculoskeletal system: Secondary | ICD-10-CM

## 2017-05-22 DIAGNOSIS — R29818 Other symptoms and signs involving the nervous system: Secondary | ICD-10-CM

## 2017-05-22 DIAGNOSIS — R2689 Other abnormalities of gait and mobility: Secondary | ICD-10-CM

## 2017-05-22 DIAGNOSIS — R2681 Unsteadiness on feet: Secondary | ICD-10-CM | POA: Diagnosis not present

## 2017-05-22 DIAGNOSIS — M25511 Pain in right shoulder: Secondary | ICD-10-CM

## 2017-05-22 DIAGNOSIS — R293 Abnormal posture: Secondary | ICD-10-CM

## 2017-05-22 DIAGNOSIS — R41840 Attention and concentration deficit: Secondary | ICD-10-CM | POA: Diagnosis not present

## 2017-05-22 DIAGNOSIS — R278 Other lack of coordination: Secondary | ICD-10-CM

## 2017-05-22 NOTE — Progress Notes (Signed)
Morgan [name] was seen today in the movement disorders clinic for neurologic consultation at the request of Dr. [name] at [facility] internal medicine.  She is accompanied by her husband who helps to supplement the history.  She has previously seen neurology, both Dr. Krista Blue and Dr. Doy Mince.  I reviewed some of these notes.  The consultation is for the evaluation of Parkinson's disease.  The patient is a 66 y.o. right handed female reports that her first sx was "active dreaming."  However, the symptom that got her to the dr was L greater than R hand tremor, only when moving hands/drying hair.  This was in 2010.  She was started on carbidopa/levodopa just as a "trial" for 2 weeks and then it was stopped because it worked.  She was told that it was diagnostic of PD.  She was then started on the Mirapex ER 1.5 mg in 07/2009. and it was worked up to 3.0 mg.  She reports compulsive shopping (she overspends her "allowance).  She mostly internets shops.  She has always shopped but she has previously not gone over the "allowance."  She plays the lottery and plays more than previous.  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  She tried to get off the medication but it was unsuccessful because of worsening of PD.  She was placed on Azilect.  She has been on it for about 2 years.  She has not noted any benefits.    It was her impression that it made the mirapex last longer.  She feels strongly that the computer work and shopping became an issue because of the azilect.   04/19/13 update:  The patient presents today with her husband who supplements the history.  Once she discontinued the Azilect, she felt that her compulsive shopping got better.  Her husband feels that she is not exercising enough, but the patient estimates that she perhaps exercises 4 days per week, but not to the intensity that her husband would like.  She had one near fall since our last visit, but she was on a trail in [place] with 2 walking sticks and one  of them something to the ground and her husband caught her.  She is still taking clonazepam at night.  Her husband thinks that she takes it too early and falls asleep on the couch, but she denies this.  There have not been hallucinations.  There has not been nausea or vomiting.  She has been slower.   08/13/13 update:  The pt is f/u for PD.  She is accompanied by her husband who supplements the hx.  She is on mirapex 3 mg daily and carbidopa/levodopa 25/100 was added last visit.  She notices that she has more energy and can do more things, although her husband states that she does not necessarily do those things (including exercise). She noted that the feet "wobble" since being on the medication.  She also notices that her shopping "addiction" has returned.  She has had no falls.  No hallucinations.  No near syncope.  09/24/13 update:  The pt is f/u regarding PD.  She is accompanied by her husband who helps to supplement the history.  Last visit, I decreased her pramipexole to 2.25 mg daily secondary to compulsive shopping.  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  They both  agree that this could be pathologic.  She is now on carbidopa/levodopa 25/100, half a tablet 4 times per day.  Dyskinesia in the foot is better.  She is just getting over the flu.  No hallucinations.  She has not started exercising faithfully, primarily because she has not been feeling well.  No falls.    12/24/13 update:  The patient is following up in regards to her Parkinson's disease.  She is accompanied by her husband who helps to supplement the history.  Her Mirapex has been reduced to 1.5 mg daily secondary to compulsive behaviors.  She did call and ask if she could increase it, but I did not want to do that.  She is still having compulsive behaviors (shopping) and her husband  would like her to discontinue the medication altogether.  She is currently on carbidopa/levodopa 25/100, half a tablet 4 times per day.  She takes at approximately 8 AM, noon, 4 PM and 9 PM.  Unfortunately, the patient had dyskinesia at higher dosages.  Nonetheless, she states that she would rather have the dyskinesia than feel like she currently does.  She feels slow and achy and just does not feel like doing anything.  She was not having any hallucinations.  She does state that she signed up for a 5K in January and would like to start training for that.  She is not having any hallucinations.  No falls.  She is still on Cymbalta, 60 mg daily as well as clonazepam for insomnia.  01/16/14 update:  The patient is accompanied by her husband who helps to supplement the history.  Last visit, we started the patient on Rytary, and she is currently on 145 mg 3 times a day.  Unfortunately, the patient reports that she had insomnia on the medication and felt "woozy" and really overall felt better on the carbidopa/levodopa IR and would like to go back to that medication, even though she dyskinesia on low dose.  She asks me if she can wean the Mirapex and restart the levodopa.  [facility].  She is planning on working up to running a 5K at [employer] in January, 2016.  She remains on the Cymbalta for her fibromyalgia.  03/20/14 update:  The patient returns today as a work in, accompanied by her husband who supplements the history.  Much has happened since our last visit.  Last visit, I tried to decrease the patient's Mirapex ER to 0.75 mg daily, primarily because of compulsive behaviors.  I started her on levodopa.  She called me back and wanted to go up on the Mirapex because of cramping in the thigh, but instead I increased her levodopa to 4 times a day.  The cramping did get better, but she called later with dyskinesia.  At that time my partner was on  call and she reluctantly increased the patient's Mirapex back to 1.5 mg daily and decreased patient's levodopa to twice a day dosing.  The patient later called me with continued dyskinesias and wanted to go further up on the Mirapex, but I have been resistant because of her compulsive spending in the past.  Instead, on 03/07/2014 I asked her to try amantadine and called in a prescription for this.  Unfortunately, she stated that she read the side effects and decided that she was not going to pick this up and take it.  She is now only on Mirapex 1.5 mg  daily and levodopa 25/100, one tablet in the morning and half tablet at night.  She would like to go back up on the Mirapex and her husband states that if he has to disable the computer to deal with her compulsive shopping then he will do that.  04/21/14 update:  Pt returns today for follow up, accompanied by her husband who supplements the history.  She is back on mirapex, 1.5 mg daily.  She did not do well on the requip and was pretty insistent on going back on the mirapex despite previous convulsive behaviors.  She wants to go off levodopa 25/100, and she is on it 1/2 bid but often forgets the second dose.  She is able to drive again.   She states that she thinks that she needs to go back up to the 3.0 of the mirapex.  Her husband is not now completely convinced that the mirapex caused compulsive shopping but thinks that boredom did that.  They are getting ready to go on a 14 day [employer] cruise at the end of October.   07/29/14 update:  Pt returns for f/u, accompanied by her husband who supplements the history.  She is now up to 3.0 mg of mirapex ER a day.   She states that she doesn't feel as good as she did when she was on it previously, but she is doing better than she was on the previous medications.  She has some trouble getting off of the sofa.  No falls.  Is doing circuit II class for PD as well as [employer] but this only takes up 2 days a  week and admits isn't doing CV work the remainder of the days.  Husband asks about finding a counselor for her anxiety, which is primarily associated with driving.  Her compulsive shopping has not gotten worse with higher dose of mirapex (feels that it is better as they just got off a cruise and she was previously shopping for the cruise)  11/04/14 update:  Pt returns for f/u, accompanied by her husband who supplements the history.  She did the [employer] since our last visit.  She does state that she was the last one in but she was able to finish.  I had got a call from the rehab unit that the patient had wanted to hold therapy because of fibromyalgia but the patient states that she wanted to continue therapy and perhaps she cancelled one day and not the entire therapy.  She asks me to renew the order.  Just a few days ago (2 nights) the patient backed down on the mirapex from 3.75 mg to 3.0 mg due to EDS and she wasn't sure if it was due to  the mirapex so she wanted to try the lower dosage.  She would like to get a scooter and asks me about that today  02/05/15 update:  The patient is accompanied by her husband who supplements the history.  She is on Mirapex ER 3.0 mg daily.  She thinks that she wants to try again potentially going up on it.  She feels a little slower than usual.  She is doing [employer] classes.     She denies any falls.  No hallucinations.  No lightheadedness or near syncope.  No dyskinesia.  She is supposed to be on Cymbalta for both peripheral neuropathy and fibromyalgia.  She ran out and is now not doing well.  She has been out of it for 2 weeks.  The patient thinks that her fibromyalgia got out of control, she quit exercising so vigorously and then her Parkinson's got worse.  She takes clonazepam as needed for insomnia (3-4 days per week).  They're planning several cruises and several vacations in the near future.  05/26/15 update:  The patient is following up today, accompanied by her husband who  supplements the history.  Last visit, I increased her Mirapex to 3.75 mg daily and restarted her Cymbalta for examination of fibromyalgia and peripheral neuropathy.  She thinks that the cymbalta helped but she thinks the increased mirapex caused more tremor.  She also thinks it causes EDS.  She is falling asleep now in the afternoon and having to take the klonopin late at night to help her sleep (1 mg).  She states that doesn't give her hang over effect.  She wants to go back down again to 3.0 mg of the mirapex.    09/03/15 update:  The patient is following up today, accompanied by her husband who supplements the history.  Last visit, the patient wanted to decrease her pramipexole to 3.0 mg daily.  Last visit, we restarted her Sinemet 25/100, half a tablet 3 times per day.  She states that she ended up d/c her pramipexole all together and increased her Sinemet to 1 po qid.  She feels well as long as she doesn't overdo it.  She remains on Cymbalta, 60 mg daily for peripheral neuropathy and fibromyalgia.  She is also on clonazepam 1 mg nightly for REM behavior disorder and insomnia.  She has been faithfully attending Parkinson's therapies at the neuro-rehabilitation center.  She has not had any falls since our last visit.  No hallucinations.  No lightheadedness or near syncope.  12/17/15 update:  The patient returns today, accompanied by her husband who supplements the history.  Last visit, she was on carbidopa/levodopa 25/100, 1 tablet 4 times per day and had taken herself off of pramipexole.  She has put herself back on pramipexole ER 0.75 mg daily and is on carbidopa/levodopa 25/100 3 times per day.  She denies hallucinations.  She had no falls but did trip over a stool in her closet but didn't fall.   She is doing [employer] class one day a week.  She is riding the recumbant bike.  She is scheduled for her assessment next week for PT/OT.    03/10/16 update:  The patient returns today, accompanied by her  husband who supplements the history.  I have reviewed multiple records since our last visit.  She has seen cardiology and was diagnosed with a left bundle branch block and PVCs which were increased by hypokalemia which was from hydrochlorothiazide.  It was recommended that she decrease the dose of the the HCTZ.  She had an echocardiogram on 01/11/2016 was unremarkable with a normal ejection fraction of 50-55%.  She is on pramipexole ER 0.75 mg daily and carbidopa/levodopa 25/100, one tablet 3 times per day.  She is on clonazepam for REM behavior disorder, 1 mg at night.  She has not had falls since our last visit.  She is having "aching" of the legs.  She was taken off of the lipitor by the PCP but it didn't help.  She originally thought that it was her fibromyalgia but she now wonders if it was the addition of levodopa.  No hallucinations.  No lightheadedness or near syncope.  Continues to chew nicotine gum but states that she is trying to cut down on it.  07/07/16 update:  Patient follows up today, accompanied by her husband who supplements the history.  She is on carbidopa/levodopa 25/100, one tablet 3 times per day (7am/2pm/8pm) and pramipexole ER, 0.75 mg daily.  She is also on clonazepam for REM behavior disorder, 1 mg at night.  She had one fall since last visit; she tripped over the dog.  She denies any hallucinations.  She denies lightheadedness or near syncope.  She remains on Cymbalta for her fibromyalgia, but gabapentin was started last visit and she has worked up to 100 mg, 2 po tid.  The gabapentin helped "a little" but not a lot.  The in home PT helped some.  Her and her husband continue to travel significantly via their RV.  10/07/16 update:  Patient follows up today, accompanied by her husband who supplements the history.  Patient remains on carbidopa/levodopa 25/100, one tablet 3 times per day.  She is still on pramipexole ER, 0.75 mg daily.  One fall.  Was practicing a stretch off a chair and she  rolled off of the chair.   Doing well with clonazepam for REM behavior disorder, 1 mg at night.  Last visit I increased her gabapentin to 300 mg 3 times a day in addition to her Cymbalta.  She is on these both for peripheral neuropathy as well as fibromyalgia.  She reports that she went up to 400 mg 3 times per day on the gabapentin and it helped.  She denies hallucinations.  She denies lightheadedness or near syncope.  After seeing speech therapy, a modified barium swallow was recommended, but when I tried to order this, the patient asked me to hold on ordering it.  She still does not think that this is a particular issue and wants to wait.  She is in [employer].    01/20/17 update:  Patient seen today in follow-up, accompanied by her husband who supplements the history.  The patient is on carbidopa/levodopa 25/100, one tablet but she increased that to 4 times per day in addition to pramipexole ER, 0.75 mg daily.  She has had no sleep attacks.  No hallucinations.  No compulsive behaviors.  One fall when she was squatting in the closet.  Didn't get hurt.  She is on clonazepam for REM behavior disorder, 1 mg at night.  She continues to use gabapentin, 400 mg 3 times a day for peripheral neuropathy as well as her fibromyalgia. C/o pain in the legs.   She is on Cymbalta for her fibromyalgia as well.  She is exercising with rock steady boxing 1-2 times per week.  05/23/17 update:  Pt seen in f/u for PD.  This patient is accompanied in the office by her spouse who supplements the history.  Pt on carbidopa/levodopa 25/100, 1/2/1 and pramipexole ER, 0.75 mg daily.  Pt had some trouble walking on the cruise but some of that was "sea legs."  Pt denies lightheadedness, near syncope.  No hallucinations.  Mood has been good.  On cymbalta both for mood and for fibromyalgia.  Tried dry needling but it wasn't helpful.   Asks for lidoderm patch for legs.  Has used in past.  Able to pay for that self pay if I write.   Continues to travel extensively with her husband.  Is no longer going to [employer] because she had shoulder injuries.  She is in [employer] classes and she is going to the [facility] for gentle yoga.    Been to the neurorehab center in august  for therapies and I reviewed those visit notes.   States that she is still going.  Is having R shoulder/arm pain and in therapy for that.    She has a group of PD patients that they call "movers and shakers" that she hangs out with and goes to lunch with and is planning a christmas party.     PREVIOUS MEDICATIONS: Mirapex and azilect; levodopa for a few weeks to "prove" benefit; requip  ALLERGIES:   Allergies  Allergen Reactions  . Other Anaphylaxis    "Anti Seizure Medications"  . Sporanox [Itraconazole]   . Tylenol [Acetaminophen]     CURRENT MEDICATIONS:  Current Outpatient Prescriptions on File Prior to Visit  Medication Sig Dispense Refill  . ALPRAZolam (XANAX) 0.25 MG tablet Take 0.25 mg by mouth daily as needed for anxiety. rarely    . aspirin 325 MG tablet Take 325 mg by mouth daily.    Marland Kitchen atorvastatin (LIPITOR) 40 MG tablet Take 40 mg by mouth daily.    . Calcium-Magnesium-Vitamin D (CALCIUM 500 PO) Take 500 mg by mouth 2 (two) times daily.    . carbidopa-levodopa (SINEMET IR) 25-100 MG tablet 1 in the morning, 2 in the afternoon, 1 in the evening 360 tablet 1  . cholecalciferol (VITAMIN D) 1000 units tablet Take 2,000 Units by mouth daily.    . clonazePAM (KLONOPIN) 1 MG tablet Take 1 tablet (1 mg total) by mouth at bedtime. 90 tablet 1  . Coenzyme Q10 (VITALINE COQ10) 60 MG TABS Take 60 mg by mouth daily.    . CYMBALTA 60 MG capsule TAKE 1 CAPSULE DAILY 90 capsule 3  . gabapentin (NEURONTIN) 300 MG capsule Take 300 mg by mouth 3 (three) times daily.    . Magnesium 250 MG TABS Take by mouth 2 (two) times daily.    . Mirabegron (MYRBETRIQ PO) Take by mouth.    . Multiple Vitamins-Minerals (ICAPS PO) Take 1 capsule by mouth daily.     . Pramipexole  Dihydrochloride 0.75 MG TB24 TAKE 1 TABLET DAILY 90 tablet 1  . raloxifene (EVISTA) 60 MG tablet Take 60 mg by mouth daily.     No current facility-administered medications on file prior to visit.     PAST MEDICAL HISTORY:   Past Medical History:  Diagnosis Date  . Fibromyalgia   . Hypercholesteremia    controlled on medication  . Hypertension   . Melanoma ([place])   . Osteopenia   . Parkinson disease ([place])     PAST SURGICAL HISTORY:   Past Surgical History:  Procedure Laterality Date  . TONSILLECTOMY    . TUBAL LIGATION      SOCIAL HISTORY:   Social History   Social History  . Marital status: Married    Spouse name: N/A  . Number of children: N/A  . Years of education: N/A   Occupational History  . 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 Smoking status: Former Smoker    Quit date: 09/29/1997  . Smokeless tobacco: Never Used     Comment: chews [place], 10-12/day  . Alcohol use Yes     Comment: Occ  . Drug use: No  . Sexual activity: Not on file   Other Topics Concern  . Not on file   Social History Narrative  . No narrative on file    FAMILY HISTORY:   Family Status  Relation Status  . Mother Deceased  colon CA  . Father Alive       healthy  . Brother Alive       healthy  . Sister Alive       4, healthy  . Child Alive       healthy  . MGM (Not Specified)    ROS:  A complete 10 system review of systems was obtained and was unremarkable apart from what is mentioned above.  PHYSICAL EXAMINATION:    VITALS:   Vitals:   05/23/17 1525  BP: 124/80  Pulse: (!) 102  SpO2: 92%  Weight: 181 lb (82.1 kg)  Height: _0  (1.651 m)    GEN:  The patient appears stated age and is in NAD. HEENT:  Normocephalic, atraumatic.  The mucous membranes are moist. The superficial temporal arteries are without ropiness or tenderness. CV:  Tachycardic.  Regular. Lungs:  CTAB Neck: No bruits   Neurological  examination:  Orientation: The patient is alert and oriented x3.   Movement examination: Tone: There is normal tone in the UE/LE.   Abnormal movements: There is no tremor.  There is axial dyskinesia and dyskinesia of the L leg (mod) Coordination:  There is good RAM's today Gait and Station: The patient has no difficulty arising out of a deep-seated chair without the use of the hands.    LABS:  Lab Results  Component Value Date   TSH 0.80 01/15/2013   Lab Results  Component Value Date   [id number] 343 01/15/2013    SPEP/UPEP negative.    Lab Results  Component Value Date   FOLATE 22.1 01/15/2013     ASSESSMENT/PLAN:  1.  Idiopathic akinetic rigid Parkinson's disease.    -We discussed the diagnosis as well as pathophysiology of the disease.  We discussed treatment options as well as prognostic indicators.  Patient education was provided.  -She'll continue on carpet the/levodopa 25/100, one tablet in the morning, 2 in the afternoon and one in the evening.  -  She is on mirapex er 0.75 mg daily.    -We again talked about the importance of exercise.  She has stopped going to rock steady boxing, but is now going to other forms of exercise.  -She has formed a social group of Parkinson's patients and really seems to be enjoying this.  I am proud of her.  Encouraged her to continue interacting with them.  -She met with our Parkinson's social worker today and discussed various services available. 2.  Fibromyalgia  -on cymbalta   -dont think that myalgias are from the addition of levodopa last September.  Stopping lipitor didn't help.  -continue gabapentin 400 mg 3 times per day, but reminded her not to change her dosing without telling me.  Refilled that today.  -dry needling wasn't helpful  -asks for RX for lidoderm.  Told insurance won't pay.  Willing to pay out of pocket via good RX.  Can apply to anterior aspects of thigh, 12 hours on and 12 hours off. 3.  probable peripheral  neuropathy, idiopathic.  -Back on Cymbalta both for peripheral neuropathy as well as fibromyalgia. 4.  history of melanoma.  -There is some old literature that suggests that the addition of levodopa can increase risk of return melanoma, but new literature suggests that this is not the etiology.   -She is having skin examininations 5.  history of tobacco abuse.  -still chewing nicotine gum and talked again today about d/c that 6.  Insomnia with some RBD  -She  is doing fine with clonazepam.  She is on 1 mg nightly 7.  Minor dysphagia  -Speech therapy recommended modified during swallow, but the patient refuses that for now. 8.  Follow up is anticipated in the next few months, sooner should new neurologic issues arise.  Much greater than 50% of this visit was spent in counseling and coordinating care.  Total face to face time:  30 min

## 2017-05-22 NOTE — Therapy (Signed)
[facility] [address] [place], [place], [address] Phone: [phone]   Fax:  [phone]  Occupational Therapy Treatment  Patient Details  Name: Morgan [name] MRN: [id number] Date of Birth: 10-12-1950 Referring Provider: Dr. Wells Guiles [name]   Encounter Date: 05/22/2017      OT End of Session - 05/22/17 1132    Visit Number 8   Number of Visits 13   Date for OT Re-Evaluation 06/08/17  extended as pt not been seen frequency   Authorization Type Medicare/BCBS, G-code needed   Authorization Time Period cert. 04/10/17-06/08/17; renewal + G-code completed 05/22/17   Authorization - Visit Number 1   Authorization - Number of Visits 10   OT Start Time 1105   OT Stop Time 1145   OT Time Calculation (min) 40 min   Activity Tolerance Patient tolerated treatment well   Behavior During Therapy WFL for tasks assessed/performed      Past Medical History:  Diagnosis Date  . Fibromyalgia   . Hypercholesteremia    controlled on medication  . Hypertension   . Melanoma ([place])   . Osteopenia   . Parkinson disease [facility])     Past Surgical History:  Procedure Laterality Date  . TONSILLECTOMY    . TUBAL LIGATION      There were no vitals filed for this visit.      Subjective Assessment - 05/22/17 1059    Subjective  Shoulder feels a lot better.  There are still times when I can't get off couch, but it's more "yes" now.   Pertinent History Parkinson's disease  (diagnosed 2010), Fibromyalgia, L bundle branch block, peripheral neuropathy, hyperlipidemia, premature ventricular contractions, macular degeneration   Limitations fall risk, impulsive   Patient Stated Goals improve balance for ADLs   Currently in Pain? Yes   Pain Score 5    Pain Location Leg   Pain Orientation Right;Left   Pain Descriptors / Indicators Sore   Pain Type Chronic pain   Pain Onset More than a month ago   Pain Frequency Intermittent   Aggravating  Factors  weather   Pain Relieving Factors unknown       PWR! Moves (basic 4) in modified quadruped (with chair) x 10-20 each with min cues For incr movement amplitude/technique and to look at hand.  Pt to perform modified version during class depending on shoulder pain.  Pt verbalized understanding.  Sliding cards across table with each UE using PWR! Hands with mod cueing/difficulty.  (incr difficulty LUE>RUE).  Sit>stand with min cueing for use of strategy.  Practiced donning/doffing jacket.  Instructed pt to doff with trunk rotation to decr pain and incr ease as well as to keep feet apart when donning/doffing.  Pt returned demo after instruction.  Checked goals and discussed progress--see below.  Pt reports today is a bad day due to LE pain/soreness.                              OT Short Term Goals - 05/22/17 1113      OT SHORT TERM GOAL #1   Title Pt will be independent with updated PD-specific HEP.--check STGs 05/11/17   Time 4   Period Weeks   Status Achieved     OT SHORT TERM GOAL #2   Title Pt will perform PPT#4 in 15sec or less without LOB.   Baseline 13.38sec with LOB requiring 2 steps   Time 4  Period Weeks   Status Achieved  2017-06-10:  13.72, 8.0sec     OT SHORT TERM GOAL #3   Title Pt will be able to pick up item from floor and open lower cabinets utilizing adaptive strategies without LOB.   Time 4   Period Weeks   Status Achieved  05/18/17           OT Long Term Goals - June 10, 2017 1109      OT LONG TERM GOAL #1   Title Pt will verbalize understanding of adaptive strategies/AE to incr ease/safety and decr R shoulder pain with ADLs/IADLs.--check LTGs 05/25/17   Time 6   Period Weeks   Status On-going  06-10-17:  needs reinforcement     OT LONG TERM GOAL #2   Title Pt will verbalize understanding of ways to prevent future complications (including shoulder pain) related to PD.   Time 6   Period Weeks   Status Achieved     OT LONG  TERM GOAL #3   Title Pt will improve coordination/functional reaching for ADLs as shown by improving score on box and blocks test by at least 4 with LUE.   Baseline L-46 blocks   Time 6   Period Weeks   Status On-going  2017/06/10:  R-44 blocks, L-41blocks     OT LONG TERM GOAL #4   Title Pt will report R shoulder pain less than or equal to 3/10 for ADLs/IADLs.   Baseline fluctuates   Time 6   Period Weeks   Status On-going  06/10/2017:  fluctuates, but 0/10 today     OT LONG TERM GOAL #5   Title Pt will demo turning strategy prn during ambulation/functional tasks to incr safety/decr fall risk.   Baseline LOB with turning x2 during eval   Time 6   Period Weeks   Status On-going               Plan - 06/10/2017 1100    Clinical Impression Statement Pt is progressing towards goals, but would benefit from reinforcement to incr carryover/consistency with strategies for functional mobility and to decr/prevent shoulder pain.   Rehab Potential Good   Current Impairments/barriers affecting progress: cognitive deficits   OT Frequency 2x / week   OT Duration 4 weeks   OT Treatment/Interventions Self-care/ADL training;Moist Heat;DME and/or AE instruction;Splinting;Patient/family education;Balance training;Therapeutic exercises;Ultrasound;Therapeutic exercise;Therapeutic activities;Passive range of motion;Functional Mobility Training;Neuromuscular education;Cryotherapy;Energy conservation;Manual Therapy;Cognitive remediation/compensation   Plan renewal completed as pt not seen for frequency--continue with remaining unmet goals   OT Home Exercise Plan Education Provided:  Supine PWR! (basic 4)   Consulted and Agree with Plan of Care Patient      Patient will benefit from skilled therapeutic intervention in order to improve the following deficits and impairments:  Decreased cognition, Decreased balance, Decreased activity tolerance, Decreased coordination, Decreased safety awareness, Impaired  tone, Pain, Impaired UE functional use, Decreased knowledge of use of DME, Decreased mobility  Visit Diagnosis: Other symptoms and signs involving the nervous system  Other symptoms and signs involving the musculoskeletal system  Other lack of coordination  Attention and concentration deficit  Acute pain of right shoulder  Unsteadiness on feet  Other abnormalities of gait and mobility  Abnormal posture      G-Codes - 06-10-17 1139    Functional Assessment Tool Used (Outpatient only) R shoulder pain 0/10 today (but inconsistent),  LOB with turning, decr ability to perform sit>stand   Self Care Current Status (P5093) At least 20 percent but less than  40 percent impaired, limited or restricted   Self Care Goal Status (H9622) At least 1 percent but less than 20 percent impaired, limited or restricted      Problem List Patient Active Problem List   Diagnosis Date Noted  . LBBB (left bundle branch block) 12/25/2015  . PVCs (premature ventricular contractions) 12/25/2015  . Hyperlipidemia 12/25/2015  . Fibromyalgia 01/16/2014  . Cholelithiasis 04/25/2013  . Paralysis agitans ([place]) 01/15/2013  . Unspecified hereditary and idiopathic peripheral neuropathy 01/15/2013   Occupational Therapy Progress Note  Dates of Reporting Period: 04/10/17 to 05/22/17  Objective Reports of Subjective Statement: see above  Objective Measurements: see above  Goal Update: see above  Plan: continue to address pain and functional mobility through use of strategies   Reason Skilled Services are Required:  Pt not seen frequency and would benefit from additional reinforcement of strategies to prevent R shoulder pain, decr fall risk, and improve functional mobility for improved movement calibration and carryover.    [facility] 05/22/2017, 2:27 PM  [facility] [address] [place], [place], [address] Phone: [phone]   Fax:   [phone]  Name: Morgan [name] MRN: [id number] Date of Birth: 11-19-1950   Vianne Bulls, OTR/L [facility] [address] [place], [place]  [address] [phone] phone [phone] 05/22/17 2:27 PM

## 2017-05-23 ENCOUNTER — Encounter: Payer: Self-pay | Admitting: Neurology

## 2017-05-23 ENCOUNTER — Ambulatory Visit (INDEPENDENT_AMBULATORY_CARE_PROVIDER_SITE_OTHER): Payer: Medicare Other | Admitting: Neurology

## 2017-05-23 VITALS — BP 124/80 | HR 102 | Ht 65.0 in | Wt 181.0 lb

## 2017-05-23 DIAGNOSIS — M797 Fibromyalgia: Secondary | ICD-10-CM | POA: Diagnosis not present

## 2017-05-23 DIAGNOSIS — G249 Dystonia, unspecified: Secondary | ICD-10-CM

## 2017-05-23 DIAGNOSIS — G20 Parkinson's disease: Secondary | ICD-10-CM

## 2017-05-23 MED ORDER — LIDOCAINE 5 % EX PTCH: 1.0000 | MEDICATED_PATCH | CUTANEOUS | 5 refills | Status: DC

## 2017-05-24 ENCOUNTER — Ambulatory Visit: Payer: Medicare Other | Admitting: Occupational Therapy

## 2017-05-24 DIAGNOSIS — R2689 Other abnormalities of gait and mobility: Secondary | ICD-10-CM

## 2017-05-24 DIAGNOSIS — M25511 Pain in right shoulder: Secondary | ICD-10-CM

## 2017-05-24 DIAGNOSIS — R293 Abnormal posture: Secondary | ICD-10-CM

## 2017-05-24 DIAGNOSIS — R29818 Other symptoms and signs involving the nervous system: Secondary | ICD-10-CM | POA: Diagnosis not present

## 2017-05-24 DIAGNOSIS — R29898 Other symptoms and signs involving the musculoskeletal system: Secondary | ICD-10-CM | POA: Diagnosis not present

## 2017-05-24 DIAGNOSIS — R2681 Unsteadiness on feet: Secondary | ICD-10-CM | POA: Diagnosis not present

## 2017-05-24 DIAGNOSIS — R278 Other lack of coordination: Secondary | ICD-10-CM

## 2017-05-24 DIAGNOSIS — R41840 Attention and concentration deficit: Secondary | ICD-10-CM | POA: Diagnosis not present

## 2017-05-24 NOTE — Therapy (Signed)
[facility] [address] [place], [place], [address] Phone: [phone]   Fax:  [phone]  Occupational Therapy Treatment  Patient Details  Name: Morgan [name] MRN: [id number] Date of Birth: Aug 25, 1951 Referring Provider: Dr. Wells Guiles [name]   Encounter Date: 05/24/2017      OT End of Session - 05/24/17 1111    Visit Number 9   Number of Visits 13   Date for OT Re-Evaluation 06/08/17  extended as pt not been seen frequency   Authorization Type Medicare/BCBS, G-code needed   Authorization Time Period cert. 04/10/17-06/08/17; renewal + G-code completed 05/22/17   Authorization - Visit Number 2   Authorization - Number of Visits 10   OT Start Time 1106   OT Stop Time 1145   OT Time Calculation (min) 39 min   Activity Tolerance Patient tolerated treatment well   Behavior During Therapy WFL for tasks assessed/performed      Past Medical History:  Diagnosis Date  . Fibromyalgia   . Hypercholesteremia    controlled on medication  . Hypertension   . Melanoma ([place])   . Osteopenia   . Parkinson disease [facility])     Past Surgical History:  Procedure Laterality Date  . TONSILLECTOMY    . TUBAL LIGATION      There were no vitals filed for this visit.      Subjective Assessment - 05/24/17 1109    Subjective  wants to work on backing up and functional reach with RUE   Pertinent History Parkinson's disease  (diagnosed 2010), Fibromyalgia, L bundle branch block, peripheral neuropathy, hyperlipidemia, premature ventricular contractions, macular degeneration   Limitations fall risk, impulsive   Patient Stated Goals improve balance for ADLs   Currently in Pain? Yes   Pain Score 3    Pain Location Leg   Pain Orientation Right;Left   Pain Descriptors / Indicators Sore   Pain Type Chronic pain   Pain Onset More than a month ago   Pain Frequency Intermittent   Aggravating Factors  fibromyalgia   Pain Relieving Factors  unknown      Functional mobility:   Ambulating to practice direction changes (after instruction/review of strategy) while carrying cup of water and plate with loose objects with no LOB and only 1 cue for incorrect technique.   In standing, functional step and reach forward/back to retrieve/replace cylinder objects on overhead shelf with RUE using PWR! Hands/reach with min cueing for large amplitude step/hands.    Sit>stand with min cues for large amplitude movement technique, emphasizing forward reach vs. Down.  Side-stepping in prep to sit in chair at table and then sitting at table, backing up to chair with focus on keeping feet apart and large amplitude with min-mod cueing initially, improved with repetition.  Reviewed use of diagonal step with same LE as direction change for turn.  Pt verbalized understanding.       In sitting, shoulder flex and diagonals to each side with trunk rotation with focus on large amplitude movements and associated trunk movements with min cueing.                      OT Short Term Goals - 05/22/17 1113      OT SHORT TERM GOAL #1   Title Pt will be independent with updated PD-specific HEP.--check STGs 05/11/17   Time 4   Period Weeks   Status Achieved     OT SHORT TERM GOAL #2   Title  Pt will perform PPT#4 in 15sec or less without LOB.   Baseline 13.38sec with LOB requiring 2 steps   Time 4   Period Weeks   Status Achieved  05/22/17:  13.72, 8.0sec     OT SHORT TERM GOAL #3   Title Pt will be able to pick up item from floor and open lower cabinets utilizing adaptive strategies without LOB.   Time 4   Period Weeks   Status Achieved  05/18/17           OT Long Term Goals - 05/24/17 1506      OT LONG TERM GOAL #1   Title Pt will verbalize understanding of adaptive strategies/AE to incr ease/safety and decr R shoulder pain with ADLs/IADLs.--check LTGs 05/25/17   Time 6   Period Weeks   Status On-going  05/22/17:  needs  reinforcement     OT LONG TERM GOAL #2   Title Pt will verbalize understanding of ways to prevent future complications (including shoulder pain) related to PD.   Time 6   Period Weeks   Status Achieved     OT LONG TERM GOAL #3   Title Pt will improve coordination/functional reaching for ADLs as shown by improving score on box and blocks test by at least 4 with LUE.   Baseline L-46 blocks   Time 6   Period Weeks   Status On-going  05/22/17:  R-44 blocks, L-41blocks     OT LONG TERM GOAL #4   Title Pt will report R shoulder pain less than or equal to 3/10 for ADLs/IADLs.   Baseline fluctuates   Time 6   Period Weeks   Status On-going  05/22/17:  fluctuates, but 0/10 today     OT LONG TERM GOAL #5   Title Pt will demo turning strategy prn during ambulation/functional tasks to incr safety/decr fall risk.   Baseline LOB with turning x2 during eval   Time 6   Period Weeks   Status Achieved  05/24/17               Plan - 05/24/17 1505    Clinical Impression Statement Pt is progressing towards goals.  Pt continues to demo difficulty with consistent use of functional mobility strategies but is able to verbalize and demo improved turning during tasks.   Rehab Potential Good   Current Impairments/barriers affecting progress: cognitive deficits   OT Frequency 2x / week   OT Duration 4 weeks   OT Treatment/Interventions Self-care/ADL training;Moist Heat;DME and/or AE instruction;Splinting;Patient/family education;Balance training;Therapeutic exercises;Ultrasound;Therapeutic exercise;Therapeutic activities;Passive range of motion;Functional Mobility Training;Neuromuscular education;Cryotherapy;Energy conservation;Manual Therapy;Cognitive remediation/compensation   Plan functional mobility for ADLs/IADLs, RUE functional reaching with associated trunk movements   OT Home Exercise Plan Education Provided:  Supine PWR! (basic 4)   Consulted and Agree with Plan of Care Patient       Patient will benefit from skilled therapeutic intervention in order to improve the following deficits and impairments:  Decreased cognition, Decreased balance, Decreased activity tolerance, Decreased coordination, Decreased safety awareness, Impaired tone, Pain, Impaired UE functional use, Decreased knowledge of use of DME, Decreased mobility  Visit Diagnosis: Other symptoms and signs involving the nervous system  Other symptoms and signs involving the musculoskeletal system  Other lack of coordination  Attention and concentration deficit  Acute pain of right shoulder  Unsteadiness on feet  Other abnormalities of gait and mobility  Abnormal posture    Problem List Patient Active Problem List   Diagnosis Date Noted  .  LBBB (left bundle branch block) 12/25/2015  . PVCs (premature ventricular contractions) 12/25/2015  . Hyperlipidemia 12/25/2015  . Fibromyalgia 01/16/2014  . Cholelithiasis 04/25/2013  . Paralysis agitans ([place]) 01/15/2013  . Unspecified hereditary and idiopathic peripheral neuropathy 01/15/2013    [facility] 05/24/2017, 3:07 PM  [facility] [address] [place], [place], [address] Phone: [phone]   Fax:  [phone]  Name: Morgan [name] MRN: [id number] Date of Birth: May 10, 1951   Vianne Bulls, OTR/L [facility] [address] [place], [place]  [address] [phone] phone [phone] 05/24/17 3:07 PM

## 2017-05-30 ENCOUNTER — Ambulatory Visit: Payer: Medicare Other | Attending: Family Medicine | Admitting: Occupational Therapy

## 2017-05-30 DIAGNOSIS — R2689 Other abnormalities of gait and mobility: Secondary | ICD-10-CM | POA: Insufficient documentation

## 2017-05-30 DIAGNOSIS — R41840 Attention and concentration deficit: Secondary | ICD-10-CM | POA: Insufficient documentation

## 2017-05-30 DIAGNOSIS — M25511 Pain in right shoulder: Secondary | ICD-10-CM | POA: Insufficient documentation

## 2017-05-30 DIAGNOSIS — R2681 Unsteadiness on feet: Secondary | ICD-10-CM | POA: Insufficient documentation

## 2017-05-30 DIAGNOSIS — R29818 Other symptoms and signs involving the nervous system: Secondary | ICD-10-CM | POA: Insufficient documentation

## 2017-05-30 DIAGNOSIS — R29898 Other symptoms and signs involving the musculoskeletal system: Secondary | ICD-10-CM | POA: Insufficient documentation

## 2017-05-30 DIAGNOSIS — R278 Other lack of coordination: Secondary | ICD-10-CM | POA: Insufficient documentation

## 2017-05-30 DIAGNOSIS — R293 Abnormal posture: Secondary | ICD-10-CM | POA: Insufficient documentation

## 2017-05-30 NOTE — Therapy (Signed)
[facility] [address] [place], [place], [address] Phone: [phone]   Fax:  [phone]  Patient Details  Name: Morgan [name] MRN: [id number] Date of Birth: 1951-05-24 Referring Provider:  [name], MD  Encounter Date: 05/30/2017 Pt arrived for therapy reporting she was not feeling well. BP was checked and it was noted to be 185/114. Therapist called pt's husband and he came to pick her up. Pt declined therapists's request to call MD. Pt's husband reports he has a call into her MD already about a different matter and he will speak with MD about pt's BP today.Pt's husband reports he will monitor pt's BP at home. Pt/ husband report they have had a particularly stressful morning.  RINE,KATHRYN 05/30/2017, 1:05 PM Theone Murdoch, OTR/L [phone] Phone: [phone] 1:08 PM 05/30/17 [facility] [address] [place], [place], [address] Phone: [phone]   Fax:  [phone]

## 2017-06-01 ENCOUNTER — Encounter: Payer: Medicare Other | Admitting: Occupational Therapy

## 2017-06-06 ENCOUNTER — Ambulatory Visit: Payer: Medicare Other | Admitting: Occupational Therapy

## 2017-06-12 ENCOUNTER — Ambulatory Visit: Payer: Medicare Other | Admitting: Occupational Therapy

## 2017-06-12 DIAGNOSIS — R41840 Attention and concentration deficit: Secondary | ICD-10-CM

## 2017-06-12 DIAGNOSIS — R29818 Other symptoms and signs involving the nervous system: Secondary | ICD-10-CM

## 2017-06-12 DIAGNOSIS — M25511 Pain in right shoulder: Secondary | ICD-10-CM

## 2017-06-12 DIAGNOSIS — R29898 Other symptoms and signs involving the musculoskeletal system: Secondary | ICD-10-CM

## 2017-06-12 DIAGNOSIS — R278 Other lack of coordination: Secondary | ICD-10-CM | POA: Diagnosis not present

## 2017-06-12 DIAGNOSIS — R2689 Other abnormalities of gait and mobility: Secondary | ICD-10-CM

## 2017-06-12 DIAGNOSIS — R2681 Unsteadiness on feet: Secondary | ICD-10-CM

## 2017-06-12 DIAGNOSIS — R293 Abnormal posture: Secondary | ICD-10-CM

## 2017-06-12 NOTE — Therapy (Signed)
[facility] [address] [place], [place], [address] Phone: [phone]   Fax:  [phone]  Occupational Therapy Treatment  Patient Details  Name: Morgan [name] MRN: [id number] Date of Birth: 1950/12/18 Referring Provider: Dr. Wells Guiles [name]   Encounter Date: 06/12/2017      OT End of Session - 06/12/17 1328    Visit Number 10   Number of Visits 13   Date for OT Re-Evaluation 06/08/17  extended as pt not been seen frequency   Authorization Type Medicare/BCBS, G-code needed   Authorization Time Period cert. 04/10/17-06/08/17; renewal + G-code completed 05/22/17   Authorization - Visit Number 3   Authorization - Number of Visits 10   OT Start Time 1318   OT Stop Time 1400   OT Time Calculation (min) 42 min   Activity Tolerance Patient tolerated treatment well   Behavior During Therapy WFL for tasks assessed/performed      Past Medical History:  Diagnosis Date  . Fibromyalgia   . Hypercholesteremia    controlled on medication  . Hypertension   . Melanoma ([place])   . Osteopenia   . Parkinson disease [facility])     Past Surgical History:  Procedure Laterality Date  . TONSILLECTOMY    . TUBAL LIGATION      There were no vitals filed for this visit.      Subjective Assessment - 06/12/17 1321    Subjective  Pt reports that high blood pressure lasted a couple days and put her back on her BP meds   Pertinent History Parkinson's disease  (diagnosed 2010), Fibromyalgia, L bundle branch block, peripheral neuropathy, hyperlipidemia, premature ventricular contractions, macular degeneration   Limitations fall risk, impulsive   Patient Stated Goals improve balance for ADLs   Currently in Pain? Yes   Pain Score 4    Pain Location --  shoulder   Pain Orientation Right   Pain Descriptors / Indicators Sore   Pain Type Chronic pain   Pain Onset More than a month ago   Pain Frequency Intermittent   Aggravating Factors   improper positioning, ?too much activity   Pain Relieving Factors unknown        Neuro re-ed:  PWR! Moves (basic 4) in supine x 10-20 each with min cues For incr movement amplitude.  PWR! Twist in prone x10 to each side with min cueing.  In supine, shoulder flex AAROM with ball BUEs with min cueing for stretch.  Checked goals and discussed progress--see below.  Pt agrees to d/c.  Recommend screen in approx 6-29month.                          OT Short Term Goals - 05/22/17 1113      OT SHORT TERM GOAL #1   Title Pt will be independent with updated PD-specific HEP.--check STGs 05/11/17   Time 4   Period Weeks   Status Achieved     OT SHORT TERM GOAL #2   Title Pt will perform PPT#4 in 15sec or less without LOB.   Baseline 13.38sec with LOB requiring 2 steps   Time 4   Period Weeks   Status Achieved  05/22/17:  13.72, 8.0sec     OT SHORT TERM GOAL #3   Title Pt will be able to pick up item from floor and open lower cabinets utilizing adaptive strategies without LOB.   Time 4   Period Weeks   Status Achieved  05/18/17           OT Long Term Goals - 06/12/17 1352      OT LONG TERM GOAL #1   Title Pt will verbalize understanding of adaptive strategies/AE to incr ease/safety and decr R shoulder pain with ADLs/IADLs.--check LTGs 05/25/17   Time 6   Period Weeks   Status Achieved  05/22/17:  needs reinforcement     OT LONG TERM GOAL #2   Title Pt will verbalize understanding of ways to prevent future complications (including shoulder pain) related to PD.   Time 6   Period Weeks   Status Achieved     OT LONG TERM GOAL #3   Title Pt will improve coordination/functional reaching for ADLs as shown by improving score on box and blocks test by at least 4 with LUE.   Baseline L-46 blocks   Time 6   Period Weeks   Status Not Met  05/22/17:  R-44 blocks, L-41blocks.  06/12/17:  R-46blocks, L-42blocks     OT LONG TERM GOAL #4   Title Pt will report R  shoulder pain less than or equal to 3/10 for ADLs/IADLs.   Baseline fluctuates   Time 6   Period Weeks   Status Not Met  05/22/17:  fluctuates, but 0/10 today.  06/12/17:  continues to fluctuate, but improved overall.  0-4/10 mostly     OT LONG TERM GOAL #5   Title Pt will demo turning strategy prn during ambulation/functional tasks to incr safety/decr fall risk.   Baseline LOB with turning x2 during eval   Time 6   Period Weeks   Status Achieved  05/24/17               Plan - 06/12/17 1329    Clinical Impression Statement Pt has made progress towards goals and verbalizes understanding of ways to decr shoulder pain.     Rehab Potential Good   Current Impairments/barriers affecting progress: cognitive deficits   OT Frequency 2x / week   OT Duration 4 weeks   OT Treatment/Interventions Self-care/ADL training;Moist Heat;DME and/or AE instruction;Splinting;Patient/family education;Balance training;Therapeutic exercises;Ultrasound;Therapeutic exercise;Therapeutic activities;Passive range of motion;Functional Mobility Training;Neuromuscular education;Cryotherapy;Energy conservation;Manual Therapy;Cognitive remediation/compensation   Plan d/c OT, recommend OT,PT,ST screens in approx 6 months   OT Home Exercise Plan Education Provided:  Supine PWR! (basic 4)   Consulted and Agree with Plan of Care Patient      Patient will benefit from skilled therapeutic intervention in order to improve the following deficits and impairments:  Decreased cognition, Decreased balance, Decreased activity tolerance, Decreased coordination, Decreased safety awareness, Impaired tone, Pain, Impaired UE functional use, Decreased knowledge of use of DME, Decreased mobility  Visit Diagnosis: Other symptoms and signs involving the nervous system  Other symptoms and signs involving the musculoskeletal system  Other lack of coordination  Attention and concentration deficit  Acute pain of right  shoulder  Unsteadiness on feet  Other abnormalities of gait and mobility  Abnormal posture      G-Codes - 06/12/17 1711    Functional Assessment Tool Used (Outpatient only) R shoulder pain improved by end of session, but up to 4/10 today initially (but inconsistent),  verbalizes understanding of functional mobility techniques and strategies to decr R shoulder pain   Functional Limitation Self care   Self Care Goal Status (G8988) At least 1 percent but less than 20 percent impaired, limited or restricted   Self Care Discharge Status (G8989) At least 1 percent but less than 20 percent impaired,   limited or restricted      Problem List Patient Active Problem List   Diagnosis Date Noted  . LBBB (left bundle branch block) 12/25/2015  . PVCs (premature ventricular contractions) 12/25/2015  . Hyperlipidemia 12/25/2015  . Fibromyalgia 01/16/2014  . Cholelithiasis 04/25/2013  . Paralysis agitans ([place]) 01/15/2013  . Unspecified hereditary and idiopathic peripheral neuropathy 01/15/2013     OCCUPATIONAL THERAPY DISCHARGE SUMMARY  Visits from Start of Care: 10  Current functional level related to goals / functional outcomes: See above    Remaining deficits: Bradykinesia, timing deficits, decr coordination, decr balance/functional mobility, R shoulder pain (fluctuates), abnormal posture   Education / Equipment: Ways to prevent future complications, HEP, strategies for ADLs to decr shoulder pain/incr ease.  Pt verbalized understanding of all education provided.  Plan: Patient agrees to discharge.  Patient goals were partially met. Patient is being discharged due to meeting the stated rehab goals.  And being pleased with current functional level.  Pt would benefit from re-evaluation/occupational therapy screen in approx 6 months to assess for need for further therapy/functional changes due to progressive nature of diagnosis. ?????        [facility] 06/12/2017, 5:13 PM  [facility] [address] [place], [place], [address] Phone: [phone]   Fax:  [phone]  Name: Morgan [name] MRN: [id number] Date of Birth: Nov 02, 1950   Vianne Bulls, OTR/L [facility] [address] [place], [place]  [address] [phone] phone [phone] 06/12/17 5:13 PM

## 2017-06-20 ENCOUNTER — Other Ambulatory Visit: Payer: Self-pay | Admitting: Neurology

## 2017-06-20 MED ORDER — GABAPENTIN 400 MG PO CAPS: 400.0000 mg | ORAL_CAPSULE | Freq: Three times a day (TID) | ORAL | 5 refills | Status: DC

## 2017-06-28 DIAGNOSIS — H353211 Exudative age-related macular degeneration, right eye, with active choroidal neovascularization: Secondary | ICD-10-CM | POA: Diagnosis not present

## 2017-08-03 DIAGNOSIS — M810 Age-related osteoporosis without current pathological fracture: Secondary | ICD-10-CM | POA: Diagnosis not present

## 2017-08-03 DIAGNOSIS — Z1231 Encounter for screening mammogram for malignant neoplasm of breast: Secondary | ICD-10-CM | POA: Diagnosis not present

## 2017-08-09 ENCOUNTER — Other Ambulatory Visit: Payer: Self-pay | Admitting: Neurology

## 2017-08-19 ENCOUNTER — Other Ambulatory Visit: Payer: Self-pay | Admitting: Neurology

## 2017-08-23 ENCOUNTER — Telehealth: Payer: Self-pay | Admitting: Neurology

## 2017-08-23 NOTE — Telephone Encounter (Signed)
[facility] said that the Rx was filled on Dec. 24.  They will contact patient.

## 2017-08-23 NOTE — Telephone Encounter (Signed)
Patient needs her klonopin called in to the drug store

## 2017-09-05 ENCOUNTER — Other Ambulatory Visit: Payer: Self-pay | Admitting: Family Medicine

## 2017-09-05 ENCOUNTER — Other Ambulatory Visit (HOSPITAL_COMMUNITY)
Admission: RE | Admit: 2017-09-05 | Discharge: 2017-09-05 | Disposition: A | Discharge status: Home / Self Care | Payer: Medicare Other | Source: Ambulatory Visit | Attending: Family Medicine | Admitting: Family Medicine

## 2017-09-05 DIAGNOSIS — E559 Vitamin D deficiency, unspecified: Secondary | ICD-10-CM | POA: Diagnosis not present

## 2017-09-05 DIAGNOSIS — G20 Parkinson's disease: Secondary | ICD-10-CM | POA: Diagnosis not present

## 2017-09-05 DIAGNOSIS — E785 Hyperlipidemia, unspecified: Secondary | ICD-10-CM | POA: Diagnosis not present

## 2017-09-05 DIAGNOSIS — Z124 Encounter for screening for malignant neoplasm of cervix: Secondary | ICD-10-CM | POA: Diagnosis not present

## 2017-09-05 DIAGNOSIS — Z0001 Encounter for general adult medical examination with abnormal findings: Secondary | ICD-10-CM | POA: Diagnosis not present

## 2017-09-05 DIAGNOSIS — N3281 Overactive bladder: Secondary | ICD-10-CM | POA: Diagnosis not present

## 2017-09-05 DIAGNOSIS — Z1389 Encounter for screening for other disorder: Secondary | ICD-10-CM | POA: Diagnosis not present

## 2017-09-05 DIAGNOSIS — I10 Essential (primary) hypertension: Secondary | ICD-10-CM | POA: Diagnosis not present

## 2017-09-05 DIAGNOSIS — M797 Fibromyalgia: Secondary | ICD-10-CM | POA: Diagnosis not present

## 2017-09-05 DIAGNOSIS — F419 Anxiety disorder, unspecified: Secondary | ICD-10-CM | POA: Diagnosis not present

## 2017-09-05 DIAGNOSIS — B360 Pityriasis versicolor: Secondary | ICD-10-CM | POA: Diagnosis not present

## 2017-09-06 DIAGNOSIS — H353211 Exudative age-related macular degeneration, right eye, with active choroidal neovascularization: Secondary | ICD-10-CM | POA: Diagnosis not present

## 2017-09-06 LAB — CYTOLOGY - PAP: Diagnosis: NEGATIVE

## 2017-09-09 ENCOUNTER — Other Ambulatory Visit: Payer: Self-pay | Admitting: Neurology

## 2017-09-20 NOTE — Progress Notes (Signed)
Morgan [name] was seen today in the movement disorders clinic for neurologic consultation at the request of Dr. [name] at [facility] internal medicine.  She is accompanied by her husband who helps to supplement the history.  She has previously seen neurology, both Dr. Krista Blue and Dr. Doy Mince.  I reviewed some of these notes.  The consultation is for the evaluation of Parkinson's disease.  The patient is a 67 y.o. right handed female reports that her first sx was "active dreaming."  However, the symptom that got her to the dr was L greater than R hand tremor, only when moving hands/drying hair.  This was in 2010.  She was started on carbidopa/levodopa just as a "trial" for 2 weeks and then it was stopped because it worked.  She was told that it was diagnostic of PD.  She was then started on the Mirapex ER 1.5 mg in 07/2009. and it was worked up to 3.0 mg.  She reports compulsive shopping (she overspends her "allowance).  She mostly internets shops.  She has always shopped but she has previously not gone over the "allowance."  She plays the lottery and plays more than previous.  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  She tried to get off the medication but it was unsuccessful because of worsening of PD.  She was placed on Azilect.  She has been on it for about 2 years.  She has not noted any benefits.    It was her impression that it made the mirapex last longer.  She feels strongly that the computer work and shopping became an issue because of the azilect.   04/19/13 update:  The patient presents today with her husband who supplements the history.  Once she discontinued the Azilect, she felt that her compulsive shopping got better.  Her husband feels that she is not exercising enough, but the patient estimates that she perhaps exercises 4 days per week, but not to the intensity that her husband would like.  She had one near fall since our last visit, but she was on a trail in [place] with 2 walking sticks and one  of them something to the ground and her husband caught her.  She is still taking clonazepam at night.  Her husband thinks that she takes it too early and falls asleep on the couch, but she denies this.  There have not been hallucinations.  There has not been nausea or vomiting.  She has been slower.   08/13/13 update:  The pt is f/u for PD.  She is accompanied by her husband who supplements the hx.  She is on mirapex 3 mg daily and carbidopa/levodopa 25/100 was added last visit.  She notices that she has more energy and can do more things, although her husband states that she does not necessarily do those things (including exercise). She noted that the feet "wobble" since being on the medication.  She also notices that her shopping "addiction" has returned.  She has had no falls.  No hallucinations.  No near syncope.  09/24/13 update:  The pt is f/u regarding PD.  She is accompanied by her husband who helps to supplement the history.  Last visit, I decreased her pramipexole to 2.25 mg daily secondary to compulsive shopping.  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  They both  agree that this could be pathologic.  She is now on carbidopa/levodopa 25/100, half a tablet 4 times per day.  Dyskinesia in the foot is better.  She is just getting over the flu.  No hallucinations.  She has not started exercising faithfully, primarily because she has not been feeling well.  No falls.    12/24/13 update:  The patient is following up in regards to her Parkinson's disease.  She is accompanied by her husband who helps to supplement the history.  Her Mirapex has been reduced to 1.5 mg daily secondary to compulsive behaviors.  She did call and ask if she could increase it, but I did not want to do that.  She is still having compulsive behaviors (shopping) and her husband  would like her to discontinue the medication altogether.  She is currently on carbidopa/levodopa 25/100, half a tablet 4 times per day.  She takes at approximately 8 AM, noon, 4 PM and 9 PM.  Unfortunately, the patient had dyskinesia at higher dosages.  Nonetheless, she states that she would rather have the dyskinesia than feel like she currently does.  She feels slow and achy and just does not feel like doing anything.  She was not having any hallucinations.  She does state that she signed up for a 5K in January and would like to start training for that.  She is not having any hallucinations.  No falls.  She is still on Cymbalta, 60 mg daily as well as clonazepam for insomnia.  01/16/14 update:  The patient is accompanied by her husband who helps to supplement the history.  Last visit, we started the patient on Rytary, and she is currently on 145 mg 3 times a day.  Unfortunately, the patient reports that she had insomnia on the medication and felt "woozy" and really overall felt better on the carbidopa/levodopa IR and would like to go back to that medication, even though she dyskinesia on low dose.  She asks me if she can wean the Mirapex and restart the levodopa.  [facility].  She is planning on working up to running a 5K at [employer] in January, 2016.  She remains on the Cymbalta for her fibromyalgia.  03/20/14 update:  The patient returns today as a work in, accompanied by her husband who supplements the history.  Much has happened since our last visit.  Last visit, I tried to decrease the patient's Mirapex ER to 0.75 mg daily, primarily because of compulsive behaviors.  I started her on levodopa.  She called me back and wanted to go up on the Mirapex because of cramping in the thigh, but instead I increased her levodopa to 4 times a day.  The cramping did get better, but she called later with dyskinesia.  At that time my partner was on  call and she reluctantly increased the patient's Mirapex back to 1.5 mg daily and decreased patient's levodopa to twice a day dosing.  The patient later called me with continued dyskinesias and wanted to go further up on the Mirapex, but I have been resistant because of her compulsive spending in the past.  Instead, on 03/07/2014 I asked her to try amantadine and called in a prescription for this.  Unfortunately, she stated that she read the side effects and decided that she was not going to pick this up and take it.  She is now only on Mirapex 1.5 mg  daily and levodopa 25/100, one tablet in the morning and half tablet at night.  She would like to go back up on the Mirapex and her husband states that if he has to disable the computer to deal with her compulsive shopping then he will do that.  04/21/14 update:  Pt returns today for follow up, accompanied by her husband who supplements the history.  She is back on mirapex, 1.5 mg daily.  She did not do well on the requip and was pretty insistent on going back on the mirapex despite previous convulsive behaviors.  She wants to go off levodopa 25/100, and she is on it 1/2 bid but often forgets the second dose.  She is able to drive again.   She states that she thinks that she needs to go back up to the 3.0 of the mirapex.  Her husband is not now completely convinced that the mirapex caused compulsive shopping but thinks that boredom did that.  They are getting ready to go on a 14 day [employer] cruise at the end of October.   07/29/14 update:  Pt returns for f/u, accompanied by her husband who supplements the history.  She is now up to 3.0 mg of mirapex ER a day.   She states that she doesn't feel as good as she did when she was on it previously, but she is doing better than she was on the previous medications.  She has some trouble getting off of the sofa.  No falls.  Is doing circuit II class for PD as well as [employer] but this only takes up 2 days a  week and admits isn't doing CV work the remainder of the days.  Husband asks about finding a counselor for her anxiety, which is primarily associated with driving.  Her compulsive shopping has not gotten worse with higher dose of mirapex (feels that it is better as they just got off a cruise and she was previously shopping for the cruise)  11/04/14 update:  Pt returns for f/u, accompanied by her husband who supplements the history.  She did the [employer] since our last visit.  She does state that she was the last one in but she was able to finish.  I had got a call from the rehab unit that the patient had wanted to hold therapy because of fibromyalgia but the patient states that she wanted to continue therapy and perhaps she cancelled one day and not the entire therapy.  She asks me to renew the order.  Just a few days ago (2 nights) the patient backed down on the mirapex from 3.75 mg to 3.0 mg due to EDS and she wasn't sure if it was due to  the mirapex so she wanted to try the lower dosage.  She would like to get a scooter and asks me about that today  02/05/15 update:  The patient is accompanied by her husband who supplements the history.  She is on Mirapex ER 3.0 mg daily.  She thinks that she wants to try again potentially going up on it.  She feels a little slower than usual.  She is doing [employer]WR classes.     She denies any falls.  No hallucinations.  No lightheadedness or near syncope.  No dyskinesia.  She is supposed to be on Cymbalta for both peripheral neuropathy and fibromyalgia.  She ran out and is now not doing well.  She has been out of it for 2 weeks.  The patient thinks that her fibromyalgia got out of control, she quit exercising so vigorously and then her Parkinson's got worse.  She takes clonazepam as needed for insomnia (3-4 days per week).  They're planning several cruises and several vacations in the near future.  05/26/15 update:  The patient is following up today, accompanied by her husband who  supplements the history.  Last visit, I increased her Mirapex to 3.75 mg daily and restarted her Cymbalta for examination of fibromyalgia and peripheral neuropathy.  She thinks that the cymbalta helped but she thinks the increased mirapex caused more tremor.  She also thinks it causes EDS.  She is falling asleep now in the afternoon and having to take the klonopin late at night to help her sleep (1 mg).  She states that doesn't give her hang over effect.  She wants to go back down again to 3.0 mg of the mirapex.    09/03/15 update:  The patient is following up today, accompanied by her husband who supplements the history.  Last visit, the patient wanted to decrease her pramipexole to 3.0 mg daily.  Last visit, we restarted her Sinemet 25/100, half a tablet 3 times per day.  She states that she ended up d/c her pramipexole all together and increased her Sinemet to 1 po qid.  She feels well as long as she doesn't overdo it.  She remains on Cymbalta, 60 mg daily for peripheral neuropathy and fibromyalgia.  She is also on clonazepam 1 mg nightly for REM behavior disorder and insomnia.  She has been faithfully attending Parkinson's therapies at the neuro-rehabilitation center.  She has not had any falls since our last visit.  No hallucinations.  No lightheadedness or near syncope.  12/17/15 update:  The patient returns today, accompanied by her husband who supplements the history.  Last visit, she was on carbidopa/levodopa 25/100, 1 tablet 4 times per day and had taken herself off of pramipexole.  She has put herself back on pramipexole ER 0.75 mg daily and is on carbidopa/levodopa 25/100 3 times per day.  She denies hallucinations.  She had no falls but did trip over a stool in her closet but didn't fall.   She is doing [employer] class one day a week.  She is riding the recumbant bike.  She is scheduled for her assessment next week for PT/OT.    03/10/16 update:  The patient returns today, accompanied by her  husband who supplements the history.  I have reviewed multiple records since our last visit.  She has seen cardiology and was diagnosed with a left bundle branch block and PVCs which were increased by hypokalemia which was from hydrochlorothiazide.  It was recommended that she decrease the dose of the the HCTZ.  She had an echocardiogram on 01/11/2016 was unremarkable with a normal ejection fraction of 50-55%.  She is on pramipexole ER 0.75 mg daily and carbidopa/levodopa 25/100, one tablet 3 times per day.  She is on clonazepam for REM behavior disorder, 1 mg at night.  She has not had falls since our last visit.  She is having "aching" of the legs.  She was taken off of the lipitor by the PCP but it didn't help.  She originally thought that it was her fibromyalgia but she now wonders if it was the addition of levodopa.  No hallucinations.  No lightheadedness or near syncope.  Continues to chew nicotine gum but states that she is trying to cut down on it.  07/07/16 update:  Patient follows up today, accompanied by her husband who supplements the history.  She is on carbidopa/levodopa 25/100, one tablet 3 times per day (7am/2pm/8pm) and pramipexole ER, 0.75 mg daily.  She is also on clonazepam for REM behavior disorder, 1 mg at night.  She had one fall since last visit; she tripped over the dog.  She denies any hallucinations.  She denies lightheadedness or near syncope.  She remains on Cymbalta for her fibromyalgia, but gabapentin was started last visit and she has worked up to 100 mg, 2 po tid.  The gabapentin helped "a little" but not a lot.  The in home PT helped some.  Her and her husband continue to travel significantly via their RV.  10/07/16 update:  Patient follows up today, accompanied by her husband who supplements the history.  Patient remains on carbidopa/levodopa 25/100, one tablet 3 times per day.  She is still on pramipexole ER, 0.75 mg daily.  One fall.  Was practicing a stretch off a chair and she  rolled off of the chair.   Doing well with clonazepam for REM behavior disorder, 1 mg at night.  Last visit I increased her gabapentin to 300 mg 3 times a day in addition to her Cymbalta.  She is on these both for peripheral neuropathy as well as fibromyalgia.  She reports that she went up to 400 mg 3 times per day on the gabapentin and it helped.  She denies hallucinations.  She denies lightheadedness or near syncope.  After seeing speech therapy, a modified barium swallow was recommended, but when I tried to order this, the patient asked me to hold on ordering it.  She still does not think that this is a particular issue and wants to wait.  She is in [employer].    01/20/17 update:  Patient seen today in follow-up, accompanied by her husband who supplements the history.  The patient is on carbidopa/levodopa 25/100, one tablet but she increased that to 4 times per day in addition to pramipexole ER, 0.75 mg daily.  She has had no sleep attacks.  No hallucinations.  No compulsive behaviors.  One fall when she was squatting in the closet.  Didn't get hurt.  She is on clonazepam for REM behavior disorder, 1 mg at night.  She continues to use gabapentin, 400 mg 3 times a day for peripheral neuropathy as well as her fibromyalgia. C/o pain in the legs.   She is on Cymbalta for her fibromyalgia as well.  She is exercising with rock steady boxing 1-2 times per week.  05/23/17 update:  Pt seen in f/u for PD.  This patient is accompanied in the office by her spouse who supplements the history.  Pt on carbidopa/levodopa 25/100, 1/2/1 and pramipexole ER, 0.75 mg daily.  Pt had some trouble walking on the cruise but some of that was "sea legs."  Pt denies lightheadedness, near syncope.  No hallucinations.  Mood has been good.  On cymbalta both for mood and for fibromyalgia.  Tried dry needling but it wasn't helpful.   Asks for lidoderm patch for legs.  Has used in past.  Able to pay for that self pay if I write.   Continues to travel extensively with her husband.  Is no longer going to [employer] because she had shoulder injuries.  She is in [employer] classes and she is going to the [facility] for gentle yoga.    Been to the neurorehab center in august  for therapies and I reviewed those visit notes.   States that she is still going.  Is having R shoulder/arm pain and in therapy for that.    She has a group of PD patients that they call "movers and shakers" that she hangs out with and goes to lunch with and is planning a christmas party.    09/21/17 update:  Pt seen in f/u.  This patient is accompanied in the office by her spouse who supplements the history.  On carbidopa/levodopa 25/100, 1/2/1 and pramipexole ER, 0.75 mg daily.  She states that carbidopa/levodopa 25/100 helps the pain in her legs and she wonders if she should increase it.  No compulsive behaviors.  No sleep attacks.  Pt denies falls.  Pt denies lightheadedness, near syncope.  No hallucinations.  Mood has been good.  On cymbalta for mood and fibromyalgia and works fairly well.  Also on gabapentin 400 mg tid for nerve pain.  She wonders if that works, thinks that levodopa helps with this (why she wants to increase it) and wants to d/c it.  She is doing silver sneakers classes at the [facility] 3 days per week and doing PWR classes one day per week and yoga one day per week.     PREVIOUS MEDICATIONS: Mirapex and azilect; levodopa for a few weeks to "prove" benefit; requip  ALLERGIES:   Allergies  Allergen Reactions  . Other Anaphylaxis    "Anti Seizure Medications"  . Sporanox [Itraconazole]   . Tylenol [Acetaminophen]     CURRENT MEDICATIONS:  Current Outpatient Medications on File Prior to Visit  Medication Sig Dispense Refill  . ALPRAZolam (XANAX) 0.25 MG tablet Take 0.25 mg by mouth daily as needed for anxiety. rarely    . aspirin 325 MG tablet Take 325 mg by mouth daily.    Marland Kitchen atorvastatin (LIPITOR) 40 MG tablet Take 40 mg by mouth daily.    .  Calcium-Magnesium-Vitamin D (CALCIUM 500 PO) Take 500 mg by mouth 2 (two) times daily.    . carbidopa-levodopa (SINEMET IR) 25-100 MG tablet 1 in the morning, 2 in the afternoon, 1 in the evening 360 tablet 1  . cholecalciferol (VITAMIN D) 1000 units tablet Take 2,000 Units by mouth daily.    . clonazePAM (KLONOPIN) 1 MG tablet TAKE 1 TABLET EVERY DAY AT BEDTIME 90 tablet 1  . Coenzyme Q10 (VITALINE COQ10) 60 MG TABS Take 60 mg by mouth daily.    . CYMBALTA 60 MG capsule TAKE 1 CAPSULE DAILY 90 capsule 3  . gabapentin (NEURONTIN) 400 MG capsule Take 1 capsule (400 mg total) by mouth 3 (three) times daily. 270 capsule 5  . lidocaine (LIDODERM) 5 % Place 1 patch onto the skin daily. Remove & Discard patch within 12 hours or as directed by MD 30 patch 5  . Magnesium 250 MG TABS Take by mouth 2 (two) times daily.    . Mirabegron (MYRBETRIQ PO) Take by mouth.    . Multiple Vitamins-Minerals (ICAPS PO) Take 1 capsule by mouth daily.     . Pramipexole Dihydrochloride 0.75 MG TB24 TAKE 1 TABLET DAILY 90 tablet 1  . raloxifene (EVISTA) 60 MG tablet Take 60 mg by mouth daily.     No current facility-administered medications on file prior to visit.     PAST MEDICAL HISTORY:   Past Medical History:  Diagnosis Date  . Fibromyalgia   . Hypercholesteremia    controlled on medication  . Hypertension   . Melanoma ([place])   .  Osteopenia   . Parkinson disease ([place])     PAST SURGICAL HISTORY:   Past Surgical History:  Procedure Laterality Date  . TONSILLECTOMY    . TUBAL LIGATION      SOCIAL HISTORY:   Social History   Socioeconomic History  . Marital status: Married    Spouse name: Not on file  . Number of children: Not on file  . Years of education: Not on file  . Highest education level: Not on file  Social Needs  . Financial resource strain: Not on file  . Food insecurity - worry: Not on file  . Food insecurity - inability: Not on file  . Transportation needs - medical: Not on file  .  Transportation needs - non-medical: Not on file  Occupational History  . 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 Smoking status: Former Smoker    Last attempt to quit: 09/29/1997    Years since quitting: 19.9  . Smokeless tobacco: Never Used  . Tobacco comment: chews [place], 10-12/day  Substance and Sexual Activity  . Alcohol use: Yes    Comment: Occ  . Drug use: No  . Sexual activity: Not on file  Other Topics Concern  . Not on file  Social History Narrative  . Not on file    FAMILY HISTORY:   Family Status  Relation Name Status  . Mother  Deceased       colon CA  . Father  Alive       healthy  . Brother  Alive       healthy  . Sister  Alive       4, healthy  . Child  Alive       healthy  . MGM  (Not Specified)    ROS:  A complete 10 system review of systems was obtained and was unremarkable apart from what is mentioned above.  PHYSICAL EXAMINATION:    VITALS:   Vitals:   09/21/17 1535 09/21/17 1558  BP: (!) 154/92 (!) 144/98  Pulse: 82   SpO2: 96%   Weight: 178 lb 3.2 oz (80.8 kg)   Height: 5\' 5"  (1.651 m)      Gen:  Appears stated age and in NAD. HEENT:  Normocephalic, atraumatic. The mucous membranes are moist. The superficial temporal arteries are without ropiness or tenderness. Cardiovascular: Regular rate and rhythm. Lungs: Clear to auscultation bilaterally. Neck: There are no carotid bruits noted bilaterally.  NEUROLOGICAL:  Orientation:  The patient is alert and oriented x 3.   Cranial nerves: There is good facial symmetry. Extraocular muscles are intact and visual fields are full to confrontational testing. Speech is fluent and clear. Soft palate rises symmetrically and there is no tongue deviation. Hearing is intact to conversational tone. Tone: Tone is good throughout. Sensation: Sensation is intact to light touch throughout Motor: Strength is 5/5 in the bilateral upper and lower extremities.  Shoulder  shrug is equal bilaterally.  There is no pronator drift.  There are no fasciculations noted.    Movement examination: Tone: There is normal tone in the UE/LE.   Abnormal movements: There is no tremor.  There is axial dyskinesia and dyskinesia of the left leg that is mild Coordination:  There is good RAM's today Gait and Station: The patient has no difficulty arising out of a deep-seated chair without the use of the hands.  Dyskinesia is evident when she walks in the left leg.  LABS:  Lab Results  Component Value Date   TSH 0.80 01/15/2013   Lab Results  Component Value Date   [id number] 287 01/15/2013    SPEP/UPEP negative.    Lab Results  Component Value Date   FOLATE 22.1 01/15/2013     ASSESSMENT/PLAN:  1.  Idiopathic akinetic rigid Parkinson's disease.    -We discussed the diagnosis as well as pathophysiology of the disease.  We discussed treatment options as well as prognostic indicators.  Patient education was provided.  -She'll continue on carbidopa/levodopa 25/100, but will try 2/1/1 and then if feels that she needs more she can increase to 2/2/1  -  She is on mirapex er 0.75 mg daily.  She will remain on this medication.  We discussed risk, benefits, and side effects.  -We again talked about the importance of exercise.  She is working on goals with her husband regarding exercise.  -She is involved with a social group that is meeting on a monthly basis for Parkinson's disease.  She is very much enjoying this.  This has been very good for her mentally. 2.  Fibromyalgia  -on cymbalta   -dont think that myalgias are from the addition of levodopa last September.  Stopping lipitor didn't help.  -continue gabapentin 400 mg 3 times per day.  She was thinking she wanted to wean off that, but we decided to hold off on that while she was potentially increasing the levodopa.  She thought that levodopa was helping her pain.  We decided not to change to factors at once.  -dry  needling wasn't helpful 3.  probable peripheral neuropathy, idiopathic.  -Back on Cymbalta both for peripheral neuropathy as well as fibromyalgia. 4.  history of melanoma.  -There is some old literature that suggests that the addition of levodopa can increase risk of return melanoma, but new literature suggests that this is not the etiology.   -She is having skin examininations 5.  history of tobacco abuse.  -still chewing nicotine gum and talked again today about d/c that 6.  Insomnia with some RBD  -On clonazepam, 1 mg nightly. 7.  Minor dysphagia  -Speech therapy recommended modified during swallow, but the patient refuses that for now. 8.  I will see her back in the next 4-5 months, sooner should new neurologic issues arise.  Greater than 50% of the 25-minute visit was spent in counseling with the patient and her husband.

## 2017-09-21 ENCOUNTER — Encounter: Payer: Self-pay | Admitting: Neurology

## 2017-09-21 ENCOUNTER — Ambulatory Visit (INDEPENDENT_AMBULATORY_CARE_PROVIDER_SITE_OTHER): Payer: Medicare Other | Admitting: Neurology

## 2017-09-21 VITALS — BP 144/98 | HR 82 | Ht 65.0 in | Wt 178.2 lb

## 2017-09-21 DIAGNOSIS — G609 Hereditary and idiopathic neuropathy, unspecified: Secondary | ICD-10-CM

## 2017-09-21 DIAGNOSIS — G20 Parkinson's disease: Secondary | ICD-10-CM

## 2017-09-21 DIAGNOSIS — G4701 Insomnia due to medical condition: Secondary | ICD-10-CM

## 2017-09-21 DIAGNOSIS — M797 Fibromyalgia: Secondary | ICD-10-CM

## 2017-09-21 NOTE — Patient Instructions (Addendum)
1.  Don't change gabapentin for now - refill 400 mg at [facility] 2.  Try carbidopa/levodopa 25/100, 2 in the AM, 1 in the afternoon, 1 in evening.  If that doesn't help, increase to carbidopa/levodopa 25/100, 2 in the AM, 2 in the afternoon and 1 in the evening

## 2017-10-23 ENCOUNTER — Other Ambulatory Visit: Payer: Self-pay | Admitting: Neurology

## 2017-10-23 MED ORDER — CYMBALTA 60 MG PO CPEP: 60.0000 mg | ORAL_CAPSULE | Freq: Every day | ORAL | 1 refills | Status: DC

## 2017-10-23 MED ORDER — CARBIDOPA-LEVODOPA 25-100 MG PO TABS: ORAL_TABLET | ORAL | 1 refills | Status: DC

## 2017-10-23 MED ORDER — PRAMIPEXOLE DIHYDROCHLORIDE ER 0.75 MG PO TB24: 1.0000 | ORAL_TABLET | Freq: Every day | ORAL | 1 refills | Status: DC

## 2017-11-14 DIAGNOSIS — H43813 Vitreous degeneration, bilateral: Secondary | ICD-10-CM | POA: Diagnosis not present

## 2017-11-14 DIAGNOSIS — H353211 Exudative age-related macular degeneration, right eye, with active choroidal neovascularization: Secondary | ICD-10-CM | POA: Diagnosis not present

## 2017-11-14 DIAGNOSIS — H353121 Nonexudative age-related macular degeneration, left eye, early dry stage: Secondary | ICD-10-CM | POA: Diagnosis not present

## 2017-11-28 DIAGNOSIS — N950 Postmenopausal bleeding: Secondary | ICD-10-CM | POA: Diagnosis not present

## 2017-11-28 DIAGNOSIS — N939 Abnormal uterine and vaginal bleeding, unspecified: Secondary | ICD-10-CM | POA: Diagnosis not present

## 2017-12-01 DIAGNOSIS — R3989 Other symptoms and signs involving the genitourinary system: Secondary | ICD-10-CM | POA: Diagnosis not present

## 2018-01-02 DIAGNOSIS — N950 Postmenopausal bleeding: Secondary | ICD-10-CM | POA: Diagnosis not present

## 2018-01-02 DIAGNOSIS — N952 Postmenopausal atrophic vaginitis: Secondary | ICD-10-CM | POA: Diagnosis not present

## 2018-01-02 DIAGNOSIS — N941 Unspecified dyspareunia: Secondary | ICD-10-CM | POA: Diagnosis not present

## 2018-01-10 DIAGNOSIS — N9489 Other specified conditions associated with female genital organs and menstrual cycle: Secondary | ICD-10-CM | POA: Diagnosis not present

## 2018-01-10 DIAGNOSIS — N950 Postmenopausal bleeding: Secondary | ICD-10-CM | POA: Diagnosis not present

## 2018-01-23 DIAGNOSIS — H353211 Exudative age-related macular degeneration, right eye, with active choroidal neovascularization: Secondary | ICD-10-CM | POA: Diagnosis not present

## 2018-01-24 NOTE — Progress Notes (Signed)
Morgan [name] was seen today in the movement disorders clinic for neurologic consultation at the request of Dr. [name] at [facility] internal medicine.  She is accompanied by her husband who helps to supplement the history.  She has previously seen neurology, both Dr. Krista Blue and Dr. Doy Mince.  I reviewed some of these notes.  The consultation is for the evaluation of Parkinson's disease.  The patient is a 67 y.o. right handed female reports that her first sx was "active dreaming."  However, the symptom that got her to the dr was L greater than R hand tremor, only when moving hands/drying hair.  This was in 2010.  She was started on carbidopa/levodopa just as a "trial" for 2 weeks and then it was stopped because it worked.  She was told that it was diagnostic of PD.  She was then started on the Mirapex ER 1.5 mg in 07/2009. and it was worked up to 3.0 mg.  She reports compulsive shopping (she overspends her "allowance).  She mostly internets shops.  She has always shopped but she has previously not gone over the "allowance."  She plays the lottery and plays more than previous.  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  She tried to get off the medication but it was unsuccessful because of worsening of PD.  She was placed on Azilect.  She has been on it for about 2 years.  She has not noted any benefits.    It was her impression that it made the mirapex last longer.  She feels strongly that the computer work and shopping became an issue because of the azilect.   04/19/13 update:  The patient presents today with her husband who supplements the history.  Once she discontinued the Azilect, she felt that her compulsive shopping got better.  Her husband feels that she is not exercising enough, but the patient estimates that she perhaps exercises 4 days per week, but not to the intensity that her husband would like.  She had one near fall since our last visit, but she was on a trail in [place] with 2 walking sticks and one  of them something to the ground and her husband caught her.  She is still taking clonazepam at night.  Her husband thinks that she takes it too early and falls asleep on the couch, but she denies this.  There have not been hallucinations.  There has not been nausea or vomiting.  She has been slower.   08/13/13 update:  The pt is f/u for PD.  She is accompanied by her husband who supplements the hx.  She is on mirapex 3 mg daily and carbidopa/levodopa 25/100 was added last visit.  She notices that she has more energy and can do more things, although her husband states that she does not necessarily do those things (including exercise). She noted that the feet "wobble" since being on the medication.  She also notices that her shopping "addiction" has returned.  She has had no falls.  No hallucinations.  No near syncope.  09/24/13 update:  The pt is f/u regarding PD.  She is accompanied by her husband who helps to supplement the history.  Last visit, I decreased her pramipexole to 2.25 mg daily secondary to compulsive shopping.  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  They both  agree that this could be pathologic.  She is now on carbidopa/levodopa 25/100, half a tablet 4 times per day.  Dyskinesia in the foot is better.  She is just getting over the flu.  No hallucinations.  She has not started exercising faithfully, primarily because she has not been feeling well.  No falls.    12/24/13 update:  The patient is following up in regards to her Parkinson's disease.  She is accompanied by her husband who helps to supplement the history.  Her Mirapex has been reduced to 1.5 mg daily secondary to compulsive behaviors.  She did call and ask if she could increase it, but I did not want to do that.  She is still having compulsive behaviors (shopping) and her husband  would like her to discontinue the medication altogether.  She is currently on carbidopa/levodopa 25/100, half a tablet 4 times per day.  She takes at approximately 8 AM, noon, 4 PM and 9 PM.  Unfortunately, the patient had dyskinesia at higher dosages.  Nonetheless, she states that she would rather have the dyskinesia than feel like she currently does.  She feels slow and achy and just does not feel like doing anything.  She was not having any hallucinations.  She does state that she signed up for a 5K in January and would like to start training for that.  She is not having any hallucinations.  No falls.  She is still on Cymbalta, 60 mg daily as well as clonazepam for insomnia.  01/16/14 update:  The patient is accompanied by her husband who helps to supplement the history.  Last visit, we started the patient on Rytary, and she is currently on 145 mg 3 times a day.  Unfortunately, the patient reports that she had insomnia on the medication and felt "woozy" and really overall felt better on the carbidopa/levodopa IR and would like to go back to that medication, even though she dyskinesia on low dose.  She asks me if she can wean the Mirapex and restart the levodopa.  [facility].  She is planning on working up to running a 5K at [employer] in January, 2016.  She remains on the Cymbalta for her fibromyalgia.  03/20/14 update:  The patient returns today as a work in, accompanied by her husband who supplements the history.  Much has happened since our last visit.  Last visit, I tried to decrease the patient's Mirapex ER to 0.75 mg daily, primarily because of compulsive behaviors.  I started her on levodopa.  She called me back and wanted to go up on the Mirapex because of cramping in the thigh, but instead I increased her levodopa to 4 times a day.  The cramping did get better, but she called later with dyskinesia.  At that time my partner was on  call and she reluctantly increased the patient's Mirapex back to 1.5 mg daily and decreased patient's levodopa to twice a day dosing.  The patient later called me with continued dyskinesias and wanted to go further up on the Mirapex, but I have been resistant because of her compulsive spending in the past.  Instead, on 03/07/2014 I asked her to try amantadine and called in a prescription for this.  Unfortunately, she stated that she read the side effects and decided that she was not going to pick this up and take it.  She is now only on Mirapex 1.5 mg  daily and levodopa 25/100, one tablet in the morning and half tablet at night.  She would like to go back up on the Mirapex and her husband states that if he has to disable the computer to deal with her compulsive shopping then he will do that.  04/21/14 update:  Pt returns today for follow up, accompanied by her husband who supplements the history.  She is back on mirapex, 1.5 mg daily.  She did not do well on the requip and was pretty insistent on going back on the mirapex despite previous convulsive behaviors.  She wants to go off levodopa 25/100, and she is on it 1/2 bid but often forgets the second dose.  She is able to drive again.   She states that she thinks that she needs to go back up to the 3.0 of the mirapex.  Her husband is not now completely convinced that the mirapex caused compulsive shopping but thinks that boredom did that.  They are getting ready to go on a 14 day [employer] cruise at the end of October.   07/29/14 update:  Pt returns for f/u, accompanied by her husband who supplements the history.  She is now up to 3.0 mg of mirapex ER a day.   She states that she doesn't feel as good as she did when she was on it previously, but she is doing better than she was on the previous medications.  She has some trouble getting off of the sofa.  No falls.  Is doing circuit II class for PD as well as [employer] but this only takes up 2 days a  week and admits isn't doing CV work the remainder of the days.  Husband asks about finding a counselor for her anxiety, which is primarily associated with driving.  Her compulsive shopping has not gotten worse with higher dose of mirapex (feels that it is better as they just got off a cruise and she was previously shopping for the cruise)  11/04/14 update:  Pt returns for f/u, accompanied by her husband who supplements the history.  She did the [employer] since our last visit.  She does state that she was the last one in but she was able to finish.  I had got a call from the rehab unit that the patient had wanted to hold therapy because of fibromyalgia but the patient states that she wanted to continue therapy and perhaps she cancelled one day and not the entire therapy.  She asks me to renew the order.  Just a few days ago (2 nights) the patient backed down on the mirapex from 3.75 mg to 3.0 mg due to EDS and she wasn't sure if it was due to  the mirapex so she wanted to try the lower dosage.  She would like to get a scooter and asks me about that today  02/05/15 update:  The patient is accompanied by her husband who supplements the history.  She is on Mirapex ER 3.0 mg daily.  She thinks that she wants to try again potentially going up on it.  She feels a little slower than usual.  She is doing PWR classes.     She denies any falls.  No hallucinations.  No lightheadedness or near syncope.  No dyskinesia.  She is supposed to be on Cymbalta for both peripheral neuropathy and fibromyalgia.  She ran out and is now not doing well.  She has been out of it for 2 weeks.  The patient thinks that her fibromyalgia got out of control, she quit exercising so vigorously and then her Parkinson's got worse.  She takes clonazepam as needed for insomnia (3-4 days per week).  They're planning several cruises and several vacations in the near future.  05/26/15 update:  The patient is following up today, accompanied by her husband who  supplements the history.  Last visit, I increased her Mirapex to 3.75 mg daily and restarted her Cymbalta for examination of fibromyalgia and peripheral neuropathy.  She thinks that the cymbalta helped but she thinks the increased mirapex caused more tremor.  She also thinks it causes EDS.  She is falling asleep now in the afternoon and having to take the klonopin late at night to help her sleep (1 mg).  She states that doesn't give her hang over effect.  She wants to go back down again to 3.0 mg of the mirapex.    09/03/15 update:  The patient is following up today, accompanied by her husband who supplements the history.  Last visit, the patient wanted to decrease her pramipexole to 3.0 mg daily.  Last visit, we restarted her Sinemet 25/100, half a tablet 3 times per day.  She states that she ended up d/c her pramipexole all together and increased her Sinemet to 1 po qid.  She feels well as long as she doesn't overdo it.  She remains on Cymbalta, 60 mg daily for peripheral neuropathy and fibromyalgia.  She is also on clonazepam 1 mg nightly for REM behavior disorder and insomnia.  She has been faithfully attending Parkinson's therapies at the neuro-rehabilitation center.  She has not had any falls since our last visit.  No hallucinations.  No lightheadedness or near syncope.  12/17/15 update:  The patient returns today, accompanied by her husband who supplements the history.  Last visit, she was on carbidopa/levodopa 25/100, 1 tablet 4 times per day and had taken herself off of pramipexole.  She has put herself back on pramipexole ER 0.75 mg daily and is on carbidopa/levodopa 25/100 3 times per day.  She denies hallucinations.  She had no falls but did trip over a stool in her closet but didn't fall.   She is doing [employer] class one day a week.  She is riding the recumbant bike.  She is scheduled for her assessment next week for PT/OT.    03/10/16 update:  The patient returns today, accompanied by her  husband who supplements the history.  I have reviewed multiple records since our last visit.  She has seen cardiology and was diagnosed with a left bundle branch block and PVCs which were increased by hypokalemia which was from hydrochlorothiazide.  It was recommended that she decrease the dose of the the HCTZ.  She had an echocardiogram on 01/11/2016 was unremarkable with a normal ejection fraction of 50-55%.  She is on pramipexole ER 0.75 mg daily and carbidopa/levodopa 25/100, one tablet 3 times per day.  She is on clonazepam for REM behavior disorder, 1 mg at night.  She has not had falls since our last visit.  She is having "aching" of the legs.  She was taken off of the lipitor by the PCP but it didn't help.  She originally thought that it was her fibromyalgia but she now wonders if it was the addition of levodopa.  No hallucinations.  No lightheadedness or near syncope.  Continues to chew nicotine gum but states that she is trying to cut down on it.  07/07/16 update:  Patient follows up today, accompanied by her husband who supplements the history.  She is on carbidopa/levodopa 25/100, one tablet 3 times per day (7am/2pm/8pm) and pramipexole ER, 0.75 mg daily.  She is also on clonazepam for REM behavior disorder, 1 mg at night.  She had one fall since last visit; she tripped over the dog.  She denies any hallucinations.  She denies lightheadedness or near syncope.  She remains on Cymbalta for her fibromyalgia, but gabapentin was started last visit and she has worked up to 100 mg, 2 po tid.  The gabapentin helped "a little" but not a lot.  The in home PT helped some.  Her and her husband continue to travel significantly via their RV.  10/07/16 update:  Patient follows up today, accompanied by her husband who supplements the history.  Patient remains on carbidopa/levodopa 25/100, one tablet 3 times per day.  She is still on pramipexole ER, 0.75 mg daily.  One fall.  Was practicing a stretch off a chair and she  rolled off of the chair.   Doing well with clonazepam for REM behavior disorder, 1 mg at night.  Last visit I increased her gabapentin to 300 mg 3 times a day in addition to her Cymbalta.  She is on these both for peripheral neuropathy as well as fibromyalgia.  She reports that she went up to 400 mg 3 times per day on the gabapentin and it helped.  She denies hallucinations.  She denies lightheadedness or near syncope.  After seeing speech therapy, a modified barium swallow was recommended, but when I tried to order this, the patient asked me to hold on ordering it.  She still does not think that this is a particular issue and wants to wait.  She is in [employer].    01/20/17 update:  Patient seen today in follow-up, accompanied by her husband who supplements the history.  The patient is on carbidopa/levodopa 25/100, one tablet but she increased that to 4 times per day in addition to pramipexole ER, 0.75 mg daily.  She has had no sleep attacks.  No hallucinations.  No compulsive behaviors.  One fall when she was squatting in the closet.  Didn't get hurt.  She is on clonazepam for REM behavior disorder, 1 mg at night.  She continues to use gabapentin, 400 mg 3 times a day for peripheral neuropathy as well as her fibromyalgia. C/o pain in the legs.   She is on Cymbalta for her fibromyalgia as well.  She is exercising with rock steady boxing 1-2 times per week.  05/23/17 update:  Pt seen in f/u for PD.  This patient is accompanied in the office by her spouse who supplements the history.  Pt on carbidopa/levodopa 25/100, 1/2/1 and pramipexole ER, 0.75 mg daily.  Pt had some trouble walking on the cruise but some of that was "sea legs."  Pt denies lightheadedness, near syncope.  No hallucinations.  Mood has been good.  On cymbalta both for mood and for fibromyalgia.  Tried dry needling but it wasn't helpful.   Asks for lidoderm patch for legs.  Has used in past.  Able to pay for that self pay if I write.   Continues to travel extensively with her husband.  Is no longer going to [place] because she had shoulder injuries.  She is in [employer] classes and she is going to the [facility] for gentle yoga.    Been to the neurorehab center in august  for therapies and I reviewed those visit notes.   States that she is still going.  Is having R shoulder/arm pain and in therapy for that.    She has a group of PD patients that they call "movers and shakers" that she hangs out with and goes to lunch with and is planning a christmas party.    09/21/17 update:  Pt seen in f/u.  This patient is accompanied in the office by her spouse who supplements the history.  On carbidopa/levodopa 25/100, 1/2/1 and pramipexole ER, 0.75 mg daily.  She states that carbidopa/levodopa 25/100 helps the pain in her legs and she wonders if she should increase it.  No compulsive behaviors.  No sleep attacks.  Pt denies falls.  Pt denies lightheadedness, near syncope.  No hallucinations.  Mood has been good.  On cymbalta for mood and fibromyalgia and works fairly well.  Also on gabapentin 400 mg tid for nerve pain.  She wonders if that works, thinks that levodopa helps with this (why she wants to increase it) and wants to d/c it.  She is doing silver sneakers classes at the [facility] 3 days per week and doing PWR classes one day per week and yoga one day per week.    01/25/18 update: Patient is seen today in follow-up.  She is accompanied by her husband who supplements the history.  She is on carbidopa/levodopa 25/100, 2/2/1 and pramipexole ER 0.75 mg daily.  She has no compulsive behaviors or sleep attacks.  No hallucinations.  No lightheadedness or near syncope.  No falls but did recently trip over her dog and hit her hand on the door.  She didn't actually fall to the ground.  Records are reviewed since our last visit.  She is scheduled for a D&C and hysteroscopy on June 19.  She quit exercising when she had vaginal bleeding and before she found out it was just polyps.   She did much better when she was exercising in terms of physical ability.  She was previously exercising 3 days per week.  She isn't sure why she hasn't got back to it, as no restrictions from gyn.  She is also on gabapentin.  She thinks that the pain in her thighs is somewhat worse.  Does not know if that is related to the fact she is not exercising as much.  She wonders if the gabapentin is not working as well.   PREVIOUS MEDICATIONS: Mirapex and azilect; levodopa for a few weeks to "prove" benefit; requip  ALLERGIES:   Allergies  Allergen Reactions  . Other Anaphylaxis    "Anti Seizure Medications"  . Sporanox [Itraconazole]   . Tylenol [Acetaminophen]     CURRENT MEDICATIONS:  Current Outpatient Medications on File Prior to Visit  Medication Sig Dispense Refill  . ALPRAZolam (XANAX) 0.25 MG tablet Take 0.25 mg by mouth daily as needed for anxiety. rarely    . aspirin 325 MG tablet Take 325 mg by mouth daily.    Marland Kitchen atorvastatin (LIPITOR) 40 MG tablet Take 40 mg by mouth daily.    . Calcium-Magnesium-Vitamin D (CALCIUM 500 PO) Take 500 mg by mouth 2 (two) times daily.    . carbidopa-levodopa (SINEMET IR) 25-100 MG tablet 2 in the morning, 2 in the afternoon, 1 in the evening 450 tablet 1  . cholecalciferol (VITAMIN D) 1000 units tablet Take 2,000 Units by mouth daily.    . clonazePAM (KLONOPIN) 1 MG tablet TAKE 1 TABLET EVERY DAY AT BEDTIME 90 tablet  1  . Coenzyme Q10 (VITALINE COQ10) 60 MG TABS Take 60 mg by mouth daily.    . CYMBALTA 60 MG capsule Take 1 capsule (60 mg total) by mouth daily. 90 capsule 1  . gabapentin (NEURONTIN) 400 MG capsule Take 1 capsule (400 mg total) by mouth 3 (three) times daily. 270 capsule 5  . Magnesium 250 MG TABS Take by mouth 2 (two) times daily.    . Mirabegron (MYRBETRIQ PO) Take by mouth.    . Multiple Vitamins-Minerals (ICAPS PO) Take 1 capsule by mouth daily.     . Pramipexole Dihydrochloride 0.75 MG TB24 Take 1 tablet (0.75 mg total) by mouth  daily. 90 tablet 1  . raloxifene (EVISTA) 60 MG tablet Take 60 mg by mouth daily.     No current facility-administered medications on file prior to visit.     PAST MEDICAL HISTORY:   Past Medical History:  Diagnosis Date  . Fibromyalgia   . Hypercholesteremia    controlled on medication  . Hypertension   . Melanoma ([place])   . Osteopenia   . Parkinson disease ([place])     PAST SURGICAL HISTORY:   Past Surgical History:  Procedure Laterality Date  . TONSILLECTOMY    . TUBAL LIGATION      SOCIAL HISTORY:   Social History   Socioeconomic History  . Marital status: Married    Spouse name: Not on file  . Number of children: Not on file  . Years of education: Not on file  . Highest education level: Not on file  Occupational History  . 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 Financial resource strain: Not on file  . Food insecurity:    Worry: Not on file    Inability: Not on file  . Transportation needs:    Medical: Not on file    Non-medical: Not on file  Tobacco Use  . Smoking status: Former Smoker    Last attempt to quit: 09/29/1997    Years since quitting: 20.3  . Smokeless tobacco: Never Used  . Tobacco comment: chews [place], 10-12/day  Substance and Sexual Activity  . Alcohol use: Yes    Comment: Occ  . Drug use: No  . Sexual activity: Not on file  Lifestyle  . Physical activity:    Days per week: Not on file    Minutes per session: Not on file  . Stress: Not on file  Relationships  . Social connections:    Talks on phone: Not on file    Gets together: Not on file    Attends religious service: Not on file    Active member of club or organization: Not on file    Attends meetings of clubs or organizations: Not on file    Relationship status: Not on file  . Intimate partner violence:    Fear of current or ex partner: Not on file    Emotionally abused: Not on file    Physically abused: Not on file    Forced sexual  activity: Not on file  Other Topics Concern  . Not on file  Social History Narrative  . Not on file    FAMILY HISTORY:   Family Status  Relation Name Status  . Mother  Deceased       colon CA  . Father  Alive       healthy  . Brother  Alive       healthy  . Sister  Alive       4, healthy  . Child  Alive       healthy  . MGM  (Not Specified)    ROS:  A complete 10 system review of systems was obtained and was unremarkable apart from what is mentioned above.  PHYSICAL EXAMINATION:    VITALS:   Vitals:   01/25/18 1533  BP: 130/78  Pulse: 94  SpO2: 95%  Weight: 176 lb (79.8 kg)  Height: 5\' 5"  (1.651 m)    GEN:  The patient appears stated age and is in NAD. HEENT:  Normocephalic, atraumatic.  The mucous membranes are moist. The superficial temporal arteries are without ropiness or tenderness. CV:  RRR Lungs:  CTAB Neck/HEME:  There are no carotid bruits bilaterally.  Neurological examination:  Orientation: The patient is alert and oriented x3. Cranial nerves: There is good facial symmetry. The speech is fluent and clear. Soft palate rises symmetrically and there is no tongue deviation. Hearing is intact to conversational tone. Sensation: Sensation is intact to light touch throughout Motor: Strength is 5/5 in the bilateral upper and lower extremities.   Shoulder shrug is equal and symmetric.  There is no pronator drift.  Movement examination: Tone: There is normal tone in the UE/LE.   Abnormal movements: There is no tremor.  There is mild dyskinesia of the L leg Coordination:  There is good RAM's today Gait and Station: The patient has no difficulty arising out of a deep-seated chair without the use of the hands.  Dyskinesia is evident when she walks in the left leg.  LABS:  Lab Results  Component Value Date   TSH 0.80 01/15/2013   Lab Results  Component Value Date   [id number] 109 01/15/2013    SPEP/UPEP negative.    Lab Results  Component Value Date    FOLATE 22.1 01/15/2013     ASSESSMENT/PLAN:  1.  Idiopathic akinetic rigid Parkinson's disease.    -We discussed the diagnosis as well as pathophysiology of the disease.  We discussed treatment options as well as prognostic indicators.  Patient education was provided.  -Continue on carbidopa/levodopa 25/100, 2/2/1.  -  She is on mirapex er 0.75 mg daily.  She will remain on this medication.  We discussed risk, benefits, and side effects.  -needs to get back with regular exercise.  Discussed goals of exercise, including working towards 90 revolutions per minute on the bike  -asks about [facility] transportation.  Husband having trouble taking her places.  She will get [place] the form. 2.  Fibromyalgia  -on cymbalta   -dont think that myalgias are from the addition of levodopa last September.  Stopping lipitor didn't help.  -continue gabapentin 400 mg 3 times per day.  Last time, she was thinking she wanted to wean that, but today she wanted to increase it.  I think she is having more pain because she stopped exercising.  She is to go back to exercising.    -dry needling wasn't helpful 3.  probable peripheral neuropathy, idiopathic.  -Back on Cymbalta both for peripheral neuropathy as well as fibromyalgia. 4.  history of melanoma.  -There is some old literature that suggests that the addition of levodopa can increase risk of return melanoma, but new literature suggests that this is not the etiology.   -She is having skin examininations 5.  history of tobacco abuse.  -still chewing nicotine gum and talked again today about d/c that 6.  Insomnia with some RBD  -On clonazepam,  1 mg nightly. 7.  Minor dysphagia  -Speech therapy recommended modified during swallow, but the patient refuses that for now. 8.  Follow up is anticipated in the next few months, sooner should new neurologic issues arise.  Much greater than 50% of this visit was spent in counseling and coordinating care.  Total face to face  time:  25 min

## 2018-01-25 ENCOUNTER — Encounter: Payer: Self-pay | Admitting: Neurology

## 2018-01-25 ENCOUNTER — Ambulatory Visit (INDEPENDENT_AMBULATORY_CARE_PROVIDER_SITE_OTHER): Payer: Medicare Other | Admitting: Neurology

## 2018-01-25 VITALS — BP 130/78 | HR 94 | Ht 65.0 in | Wt 176.0 lb

## 2018-01-25 DIAGNOSIS — G249 Dystonia, unspecified: Secondary | ICD-10-CM

## 2018-01-25 DIAGNOSIS — M797 Fibromyalgia: Secondary | ICD-10-CM | POA: Diagnosis not present

## 2018-01-25 DIAGNOSIS — G20 Parkinson's disease: Secondary | ICD-10-CM | POA: Diagnosis not present

## 2018-01-25 NOTE — Patient Instructions (Addendum)
In regards to the day of surgery, take your carbidopa/levodopa 25/100 the morning of surgery.  Also, if you need anti-nausea medication then zofran is preferred over phenergan.  Phenergan can make parkinsons symptoms worse.    Registration is OPEN!    Third Annual Parkinson's Education Symposium   To register: [email or web address]      Search:  [employer] person attending individually Questions: [place], [place] or Janett [email or web address]   Parkinson's Caregiver Group   Parkinson's disease can be challenging for caregivers too. Changing  abilities and assuming new roles within the family can cause emotional  upheaval. Meeting with a group of peers who are experiencing similar changes is very beneficial for any caregiver. This professionally led support group will provide a safe place for Parkinson's caregivers to connect, share challenges, seek solutions, learn about resources and strengthen coping skills.    When:  Last Monday of the month (unless date falls on a holiday)  2019 Dates: 1/28, 2/25, 3/25, 4/29, 5/20, 6/24, 7/29, 8/26, 9/30, 10/28, 11/25, 12/30   Location: [facility]                                                                                              [place], [place]                                                                                      Room 204 Time: 2-3:30   Please call Myra Gianotti, MSW, LCSW at [phone] with any questions.

## 2018-01-26 ENCOUNTER — Encounter: Payer: Self-pay | Admitting: Cardiovascular Disease

## 2018-01-26 ENCOUNTER — Ambulatory Visit (INDEPENDENT_AMBULATORY_CARE_PROVIDER_SITE_OTHER): Payer: Medicare Other | Admitting: Cardiovascular Disease

## 2018-01-26 VITALS — BP 132/81 | HR 92 | Ht 65.0 in | Wt 174.0 lb

## 2018-01-26 DIAGNOSIS — Z01810 Encounter for preprocedural cardiovascular examination: Secondary | ICD-10-CM

## 2018-01-26 DIAGNOSIS — I447 Left bundle-branch block, unspecified: Secondary | ICD-10-CM | POA: Diagnosis not present

## 2018-01-26 NOTE — Patient Instructions (Signed)
Dr Croitoru recommends that you schedule a follow-up appointment in 12 months. You will receive a reminder letter in the mail two months in advance. If you don't receive a letter, please call our office to schedule the follow-up appointment.  If you need a refill on your cardiac medications before your next appointment, please call your pharmacy. 

## 2018-01-26 NOTE — Progress Notes (Signed)
Patient ID: Morgan [name], female   DOB: 24-Sep-1950, 67 y.o.   MRN: [id number]    Cardiology Office Note    Date:  01/26/2018   ID:  Morgan [name], DOB 1950/11/10, MRN [id number]  PCP:  [name], MD  Cardiologist:   Sanda Klein, MD   Chief Complaint  Patient presents with  . Follow-up    12 months  . Chest Pain    Due to stress.    History of Present Illness:  Morgan [name] is a 67 y.o. female with left bundle branch block and irregular heart rhythm.  She has done well since her last appointment.  She continues to have occasional aches and pains her chest that are distinctly nonanginal in pattern.  She has not had dizziness, palpitations or syncope.  She denies shortness of breath with activity.  Orthostatic dizziness has not been a big complaint.  She gets around with a walker due to Parkinson's disease.  Despite this, she has been on multiple trips to [place], [place] and [place].  She has no cardiac complaints during those trips.  She has not had leg edema, claudication, new focal neurological events.  She is not using her bicycle as frequently as before, although this is not because of any change in her physical monitor neurological status.  She is scheduled to undergo a D&C with Dr. Landry Mellow.  She does not have a history of structural heart disease but does have mild hypertension and hyperlipidemia both adequately treated with pharmacological means. Her most serious medical problem is Parkinson's disease she limits her mobility. Despite this, she tries to remain quite active and has even been on recent trips to [facility]. She has never smoked and does not have a family history of coronary disease. She does not have diabetes mellitus. She uses a recumbent bike 5 days a week for at least the 30 minutes and also goes to special Parkinson's exercises. Her chest discomfort is not brought on or worsened by physical exercise. She has occasional lightheadedness if she bends  over and stands up too fast.   Past Medical History:  Diagnosis Date  . Fibromyalgia   . Hypercholesteremia    controlled on medication  . Hypertension   . Melanoma ([place])   . Osteopenia   . Parkinson disease [facility])     Past Surgical History:  Procedure Laterality Date  . TONSILLECTOMY    . TUBAL LIGATION      Current Medications: Outpatient Medications Prior to Visit  Medication Sig Dispense Refill  . ALPRAZolam (XANAX) 0.25 MG tablet Take 0.25 mg by mouth daily as needed for anxiety. rarely    . aspirin 325 MG tablet Take 325 mg by mouth daily as needed for headache.     Marland Kitchen atorvastatin (LIPITOR) 40 MG tablet Take 40 mg by mouth daily.    . Calcium-Magnesium-Vitamin D (CALCIUM 500 PO) Take 500 mg by mouth 2 (two) times daily.    . carbidopa-levodopa (SINEMET IR) 25-100 MG tablet 2 in the morning, 2 in the afternoon, 1 in the evening 450 tablet 1  . cholecalciferol (VITAMIN D) 1000 units tablet Take 2,000 Units by mouth daily.    . clonazePAM (KLONOPIN) 1 MG tablet TAKE 1 TABLET EVERY DAY AT BEDTIME 90 tablet 1  . Coenzyme Q10 (VITALINE COQ10) 60 MG TABS Take 60 mg by mouth daily.    . CYMBALTA 60 MG capsule Take 1 capsule (60 mg total) by mouth daily. 90 capsule 1  . gabapentin (NEURONTIN) 400  MG capsule Take 1 capsule (400 mg total) by mouth 3 (three) times daily. 270 capsule 5  . Magnesium 250 MG TABS Take by mouth 2 (two) times daily.    . Mirabegron (MYRBETRIQ PO) Take by mouth.    . Multiple Vitamins-Minerals (ICAPS PO) Take 1 capsule by mouth daily.     . Pramipexole Dihydrochloride 0.75 MG TB24 Take 1 tablet (0.75 mg total) by mouth daily. 90 tablet 1  . raloxifene (EVISTA) 60 MG tablet Take 60 mg by mouth daily.     No facility-administered medications prior to visit.      Allergies:   Other; Sporanox [itraconazole]; and Tylenol [acetaminophen]   Social History   Socioeconomic History  . Marital status: Married    Spouse name: Not on file  . Number of children:  Not on file  . Years of education: Not on file  . Highest education level: Not on file  Occupational History  . 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 Financial resource strain: Not on file  . Food insecurity:    Worry: Not on file    Inability: Not on file  . Transportation needs:    Medical: Not on file    Non-medical: Not on file  Tobacco Use  . Smoking status: Former Smoker    Last attempt to quit: 09/29/1997    Years since quitting: 20.3  . Smokeless tobacco: Never Used  . Tobacco comment: chews [place], 10-12/day  Substance and Sexual Activity  . Alcohol use: Yes    Comment: Occ  . Drug use: No  . Sexual activity: Not on file  Lifestyle  . Physical activity:    Days per week: Not on file    Minutes per session: Not on file  . Stress: Not on file  Relationships  . Social connections:    Talks on phone: Not on file    Gets together: Not on file    Attends religious service: Not on file    Active member of club or organization: Not on file    Attends meetings of clubs or organizations: Not on file    Relationship status: Not on file  Other Topics Concern  . Not on file  Social History Narrative  . Not on file     Family History:  The patient's family history includes Cancer in her maternal grandmother and mother.   ROS:   Please see the history of present illness.    ROS All other systems reviewed and are negative.   PHYSICAL EXAM:   VS:  BP 132/81 (BP Location: Left Arm, Patient Position: Sitting, Cuff Size: Normal)   Pulse 92   Ht 5\' 5"  (1.651 m)   Wt 174 lb (78.9 kg)   BMI 28.96 kg/m     General: Alert, oriented x3, no distress, overweight Head: no evidence of trauma, PERRL, EOMI, no exophtalmos or lid lag, no myxedema, no xanthelasma; normal ears, nose and oropharynx Neck: normal jugular venous pulsations and no hepatojugular reflux; brisk carotid pulses without delay and no carotid bruits Chest: clear to  auscultation, no signs of consolidation by percussion or palpation, normal fremitus, symmetrical and full respiratory excursions Cardiovascular: normal position and quality of the apical impulse, regular rhythm, normal first and paradoxically split second heart sounds, no murmurs, rubs or gallops Abdomen: no tenderness or distention, no masses by palpation, no abnormal pulsatility or arterial bruits, normal bowel sounds, no hepatosplenomegaly Extremities: no clubbing, cyanosis or  edema; 2+ radial, ulnar and brachial pulses bilaterally; 2+ right femoral, posterior tibial and dorsalis pedis pulses; 2+ left femoral, posterior tibial and dorsalis pedis pulses; no subclavian or femoral bruits Neurological: grossly nonfocal.  Hand tremor bilaterally, sluggish movements and facial expressions, consistent with parkinsonism Psych: Normal mood and affect   Wt Readings from Last 3 Encounters:  01/26/18 174 lb (78.9 kg)  01/25/18 176 lb (79.8 kg)  09/21/17 178 lb 3.2 oz (80.8 kg)      Studies/Labs Reviewed:   EKG:  EKG is ordered today.  Shows sinus rhythm with LBBB, LAD.  Recent Labs: No results found for requested labs within last 8760 hours.   Lipid Panel    Component Value Date/Time   CHOL  09/04/2008 0105    155        ATP III CLASSIFICATION:  <200     mg/dL   Desirable  200-239  mg/dL   Borderline High  >=240    mg/dL   High          TRIG 175 (H) 09/04/2008 0105   HDL 40 09/04/2008 0105   CHOLHDL 3.9 09/04/2008 0105   VLDL 35 09/04/2008 0105   LDLCALC  09/04/2008 0105    80        Total Cholesterol/HDL:CHD Risk Coronary Heart Disease Risk Table                     Men   Women  1/2 Average Risk   3.4   3.3  Average Risk       5.0   4.4  2 X Average Risk   9.6   7.1  3 X Average Risk  23.4   11.0        Use the calculated Patient Ratio above and the CHD Risk Table to determine the patient's CHD Risk.        ATP III CLASSIFICATION (LDL):  <100     mg/dL   Optimal  100-129   mg/dL   Near or Above                    Optimal  130-159  mg/dL   Borderline  160-189  mg/dL   High  >190     mg/dL   Very High    Additional studies/ records that were reviewed today include:  Records from PCP and Ob/Gyn  ASSESSMENT:    1. LBBB (left bundle branch block)   2. Preoperative cardiovascular examination      PLAN:  In order of problems listed above:  1. LBBB: No evidence of structural heart disease or previous work-up, no symptoms suggesting high-grade AV block. 2. Preop CV eval: Low risk cardiovascular complications with the planned procedure.   Medication Adjustments/Labs and Tests Ordered: Current medicines are reviewed at length with the patient today.  Concerns regarding medicines are outlined above.  Medication changes, Labs and Tests ordered today are listed in the Patient Instructions below. Patient Instructions  Dr Sallyanne Kuster recommends that you schedule a follow-up appointment in 12 months. You will receive a reminder letter in the mail two months in advance. If you don't receive a letter, please call our office to schedule the follow-up appointment.  If you need a refill on your cardiac medications before your next appointment, please call your pharmacy.    Signed, Sanda Klein, MD  01/26/2018 9:49 AM    [facility] [address], [place], [place]  [address]  Phone: [phone]; Fax: [phone]

## 2018-01-30 NOTE — Patient Instructions (Addendum)
Your procedure is scheduled on: Wednesday, June19  Enter through the Main Entrance of [facility] at: 7 am  Pick up the phone at the desk and dial [phone].  Call this number if you have problems the morning of surgery: [phone].  Remember: Do NOT eat food or Do NOT drink clear liquids (including water) after midnight Tuesday.  Take these medicines the morning of surgery with a SIP OF WATER: Carbidopa-levodopa, cymbalta, gabapentin, pramipexole-dihydrochloride, raloxifene, preservision, and aspirin per Dr Landry Mellow. May take xanax if needed.  Stop herbal medications, vitamin supplements, ibuprofen/NSAIDS 1 week prior to surgery.  Do NOT wear jewelry (body piercing), metal hair clips/bobby pins, make-up, or nail polish. Do NOT wear lotions, powders, or perfumes.  You may wear deoderant. Do NOT shave for 48 hours prior to surgery. Do NOT bring valuables to the hospital.  Have a responsible adult drive you home and stay with you for 24 hours after your procedure.  Home with Husband [name] cell [phone].

## 2018-01-31 ENCOUNTER — Other Ambulatory Visit (HOSPITAL_COMMUNITY): Payer: Medicare Other

## 2018-01-31 ENCOUNTER — Encounter (HOSPITAL_COMMUNITY): Payer: Self-pay

## 2018-02-05 ENCOUNTER — Encounter (HOSPITAL_COMMUNITY)
Admission: RE | Admit: 2018-02-05 | Discharge: 2018-02-05 | Disposition: A | Discharge status: Home / Self Care | Payer: Medicare Other | Source: Ambulatory Visit | Attending: Obstetrics and Gynecology | Admitting: Obstetrics and Gynecology

## 2018-02-05 ENCOUNTER — Encounter (HOSPITAL_COMMUNITY): Payer: Self-pay

## 2018-02-05 ENCOUNTER — Other Ambulatory Visit: Payer: Self-pay

## 2018-02-05 DIAGNOSIS — N950 Postmenopausal bleeding: Secondary | ICD-10-CM | POA: Diagnosis not present

## 2018-02-05 DIAGNOSIS — Z01812 Encounter for preprocedural laboratory examination: Secondary | ICD-10-CM | POA: Insufficient documentation

## 2018-02-05 DIAGNOSIS — N9489 Other specified conditions associated with female genital organs and menstrual cycle: Secondary | ICD-10-CM | POA: Diagnosis not present

## 2018-02-05 HISTORY — DX: Anxiety disorder, unspecified: F41.9

## 2018-02-05 HISTORY — DX: Left bundle-branch block, unspecified: I44.7

## 2018-02-05 HISTORY — DX: Nonrheumatic mitral (valve) prolapse: I34.1

## 2018-02-05 HISTORY — DX: Hyperlipidemia, unspecified: E78.5

## 2018-02-05 LAB — BASIC METABOLIC PANEL
ANION GAP: 9 (ref 5–15)
BUN: 15 mg/dL (ref 6–20)
CHLORIDE: 103 mmol/L (ref 101–111)
CO2: 28 mmol/L (ref 22–32)
CREATININE: 0.88 mg/dL (ref 0.44–1.00)
Calcium: 9.2 mg/dL (ref 8.9–10.3)
GFR calc non Af Amer: >60 mL/min (ref >60)
Glucose, Bld: 100 mg/dL — ABNORMAL HIGH (ref 65–99)
Potassium: 3.7 mmol/L (ref 3.5–5.1)
Sodium: 140 mmol/L (ref 135–145)

## 2018-02-05 LAB — CBC
HEMATOCRIT: 41.1 % (ref 36.0–46.0)
HEMOGLOBIN: 13.3 g/dL (ref 12.0–15.0)
MCH: 28.9 pg (ref 26.0–34.0)
MCHC: 32.4 g/dL (ref 30.0–36.0)
MCV: 89.3 fL (ref 78.0–100.0)
Platelets: 223 10*3/uL (ref 150–400)
RBC: 4.6 MIL/uL (ref 3.87–5.11)
RDW: 13.5 % (ref 11.5–15.5)
WBC: 8.3 10*3/uL (ref 4.0–10.5)

## 2018-02-05 NOTE — Pre-Procedure Instructions (Signed)
Patient saw Cardiologist Dr. Dani Gobble Croitoru on 01/26/18.  EKG done and placed on chart along with cardiac clearance.

## 2018-02-07 ENCOUNTER — Ambulatory Visit: Payer: Medicare Other | Admitting: Cardiovascular Disease

## 2018-02-13 ENCOUNTER — Other Ambulatory Visit: Payer: Self-pay | Admitting: Obstetrics and Gynecology

## 2018-02-13 ENCOUNTER — Encounter (HOSPITAL_COMMUNITY): Payer: Self-pay | Admitting: Anesthesiology

## 2018-02-13 NOTE — H&P (Deleted)
  The note originally documented on this encounter has been moved the the encounter in which it belongs.  

## 2018-02-13 NOTE — Anesthesia Preprocedure Evaluation (Addendum)
Anesthesia Evaluation  Patient identified by MRN, date of birth, ID band Patient awake    Reviewed: Allergy & Precautions, NPO status , Patient's Chart, lab work & pertinent test results  Airway Mallampati: IV  TM Distance: >3 FB Neck ROM: Limited    Dental  (+) Teeth Intact, Partial Upper, Caps, Dental Advisory Given   Pulmonary former smoker,    Pulmonary exam normal breath sounds clear to auscultation       Cardiovascular hypertension, Normal cardiovascular exam+ dysrhythmias  Rhythm:Regular Rate:Normal  Hx/o PVCs Hx/o LBBB Current EKG shows non specific IVCD   Neuro/Psych Anxiety Peripheral neuropathy Parkinson's disease  Neuromuscular disease    GI/Hepatic negative GI ROS, Neg liver ROS,   Endo/Other  Hyperlipidemia  Renal/GU negative Renal ROS  negative genitourinary   Musculoskeletal  (+) Fibromyalgia -  Abdominal (+) + obese,   Peds  Hematology negative hematology ROS (+)   Anesthesia Other Findings   Reproductive/Obstetrics Endometrial mass PMB                           Anesthesia Physical Anesthesia Plan  ASA: III  Anesthesia Plan: General   Post-op Pain Management:    Induction: Intravenous  PONV Risk Score and Plan: 4 or greater and Ondansetron, Dexamethasone and Treatment may vary due to age or medical condition  Airway Management Planned: LMA  Additional Equipment:   Intra-op Plan:   Post-operative Plan: Extubation in OR  Informed Consent: I have reviewed the patients History and Physical, chart, labs and discussed the procedure including the risks, benefits and alternatives for the proposed anesthesia with the patient or authorized representative who has indicated his/her understanding and acceptance.   Dental advisory given  Plan Discussed with: CRNA and Surgeon  Anesthesia Plan Comments:        Anesthesia Quick Evaluation

## 2018-02-13 NOTE — H&P (Signed)
Chief Complaint(s):      postmenopausal bleeding.       HPI:          General          67 y/o presents preop history and physical exam in preparation for hysteroscopy D&C and possible polypectomy with myosure to evaluate and treat postmenopausal bleeding. history is pertinent for postmenopausal bleeding that started a month and a half ago. she reports that it occurred after sex. she only noticed the blood with urinating. she also noticed a large clot. she noticed this 2 days after sexual activity.             Her ultrasound 01/10/2018 shows a 6.5 cm x 3.2 cm x 4.2 cm uterus. The endometrum is 3.7 cm however there is a possible hyperechoic mass on the posterior wall 0.2cm and fundal wall 0.8 cm . no blood flow is noted. There is a solid mass in the left adnexa 4.5 cm that is thought to be a pedunculated Uterine fibroid. it is avasular. neither ovary is visualized.     Current Medication:       Taking   Magnesium 400 MG Capsule 1 tablet Orally once a day      Aspirin 325 MG Tablet 1-6 tabs Orally as needed      Co Q 10 60 MG Capsule 1 tab Orally once a day      ICaps Capsule 1 capsule Orally Twice a day      Calcium 500 MG Tablet 1 tablet with meals Orally Twice a day      Sinemet(Carbidopa-Levodopa) 25-100 MG Tablet 1 tablet Orally Three times a day      Methocarbamol 500 MG Tablet 1 tablet Orally as needed for muscle spasm      Gabapentin 400 MG Capsule 1 capsule Orally Three times a day      Clonazepam 1 MG Tablet 1 tablet at bedtime Orally as directed      Cymbalta(DULoxetine HCl) 60 MG Capsule Delayed Release Particles 1 capsule Orally once a day      Alprazolam 0.25 MG Tablet 1 tablet by mouth once a day as needed      Vitamin D3 2000 UNIT Capsule 2 capsules Orally Once a day      Evista(Raloxifene HCl) 60MG  Tablet 1 tablet Orally once a day      Atorvastatin Calcium 10MG  Tablet 1 tablet Orally Once a day          Discontinued   Myrbetriq(Mirabegron ER) 25 MG Tablet  Extended Release 24 Hour 1 tablet Orally Once a day      Cephalexin 500 MG Capsule 1 capsule Orally every 12 hrs      Medication List reviewed and reconciled with the patient        Medical History:   hypertension/ [facility]      hyperlipidemia      muscle spasms including chest wall      osteopenia      stress/anxiety      fibromyalgia      extrapyramidal movement disorder, 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 Carles Collet      gallstones conservative treatment Dr. Zella Richer      opthalmologist Dr. Ellie Lunch      GI Dr. Oletta Lamas      cardiologist Dr. Blair Hailey      dermatologist Dr. Tonia Brooms      dexa 07/18/2014 T score of radius -1.3 and hip -.  3      MVP      colonoscopoy tubular adenoma 10/27/2014 repeat in 5 years Dr. Oletta Lamas      DEXA 08/03/2017 Radius left -2.5 and hips thinnest -.7 [facility]       Allergies/Intolerance:   Sporanox: Allergy - hives      Tylenol: Side Effects - cognitive issues      anti seizure medications: Side Effects - cognitive issues       Gyn History:   Sexual activity currently sexually active with husband .   Periods : postmenopausal.   LMP 09/1997.   Last pap smear date 09/05/17-neg.   Last mammogram date 08/03/2017-normal .        OB History:   Number of pregnancies  2.   Pregnancy # 1  live birth, full term, vaginal delivery, boy.   Pregnancy # 2  live birth, full term, vaginal delivery, boy.        Surgical History:   tonsillectomy      bilateral tubal ligation      blepharoplasty (eyelids lifted) 10/09      sinus surgery      colonoscopy 03/15/1999, 05/14/2004, 08/03/2009      Cold knife [facility] in [place]       Hospitalization:   pneumonia 1999      surgery      childbirth x 2      severe chest pain 08/2008       Family History:   Father: alive 11 yrs, healthy, imbalance, prostate issues    Mother: deceased 17 yrs, colon cancer, diagnosed with Colon cancer    Paternal [place]  Father: deceased 53 yrs, alzheimers    Paternal [place] Mother: deceased 4 yrs, pneumonia    Maternal Grand Father: deceased 12 yrs, cad or CVA, CVA, Coronary artery disease    Maternal Grand Mother: deceased 50 yrs, colon cancer, Colon cancer    Brother 1: alive, healthy    Sister 1: alive, healthy    Sister 2: alive, melanoma    Sister 3: alive, Juliann Pulse prolapse of pelvis    1 brother(s) , 4 sister(s) - healthy. 2 son(s) - healthy.          Denies any family hx of colon polyps or liver disease.4th sister well but has mental disorder, ?bipolar.     Social History:       General         Tobacco use cigarettes:  Former smoker, Quit in year  1999, Pack-year Hx:  20, Tobacco history last updated  02/05/2018.           no EXPOSURE TO PASSIVE SMOKE, in the past, quit 1999.           Alcohol: yes, hardly ever, wine.           Caffeine: tea, 2 servings daily.           no Recreational drug use.           DIET: regular.           Exercise: yes, 3 times weekly.           DENTAL CARE: 2x per year.           Marital Status: married, 24 [name].           Children: 2, Boys 5 grandchildren.           EDUCATION: [employer]  OCCUPATION: retired, [employer] criminal investigation.           COMMUNICATION BARRIERS: no hearing, vision or cognition issues.           Religion: catholic, no church at present.           Seat belt use: yes.           Firearms at home: yes, gun safety is practiced.           Tobacco Exposure: None.       ROS:       CONSTITUTIONAL         Chills  No.  Fatigue  No.  Fever  No.  Night sweats  No.  Recent travel outside [place]  No.  Sweats  No.  Weight change  No.         OPHTHALMOLOGY         Blurring of vision  no.  Change in vision  no.  Double vision  no.         ENT         Dizziness  no.  Nose bleeds  no.  Sore throat  no.  Teeth pain  no.         ALLERGY         Hives  no.         CARDIOLOGY         Chest pain  no.  High blood pressure   no.  Irregular heart beat  no.  Leg edema  no.  Palpitations  no.         RESPIRATORY         Shortness of breath  no.  Cough  no.  Wheezing  no.         UROLOGY         Pain with urination  no.  Urinary urgency  no.  Urinary frequency  no.  Urinary incontinence  no.  Difficulty urinating  No.  Blood in urine  No.         GASTROENTEROLOGY         Abdominal pain  no.  Appetite change  no.  Bloating/belching  no.  Blood in stool or on toilet paper  no.  Change in bowel movements  no.  Constipation  no.  Diarrhea  no.  Difficulty swallowing  no.  Nausea  no.         FEMALE REPRODUCTIVE         Vulvar pain  no.  Vulvar rash  no.  Abnormal vaginal bleeding  yes.  Breast pain  no.  Nipple discharge  no.  Pain with intercourse  no.  Pelvic pain  no.  Unusual vaginal discharge  no.  Vaginal itching  no.         MUSCULOSKELETAL         Muscle aches  no.         NEUROLOGY         Headache  no.  Tingling/numbness  no.  Weakness  no.         PSYCHOLOGY         Depression  no.  Anxiety  no.  Nervousness  no.  Sleep disturbances  no.  Suicidal ideation  no .         ENDOCRINOLOGY         Excessive thirst  no.  Excessive urination  no.  Hair loss  no.  Heat or cold intolerance  no.         HEMATOLOGY/LYMPH         Abnormal bleeding  no.  Easy bruising  no.  Swollen glands  no.         DERMATOLOGY         New/changing skin lesion  no.  Rash  no.  Sores  no.             Negative except as stated in HPI.   Objective:    Vitals:        Wt 175, Wt change -1 lb, Ht 64, BMI 30.04, Pulse sitting 100, BP sitting 142/94     Past Results:    Examination:          General Examination         CONSTITUTIONAL: alert, oriented, NAD .          SKIN:  moist, warm.          EYES:  Conjunctiva clear.          LUNGS: clear to auscultation bilaterally.          HEART:  regular rate and rhythm.          ABDOMEN: soft, non-tender/non-distended, bowel sounds present .          FEMALE GENITOURINARY: normal  external genitalia, labia - unremarkable, vagina - pink moist mucosa, no lesions or abnormal discharge, cervix - no discharge or lesions or CMT, adnexa - no masses or tenderness, uterus - nontender and normal size on palpation .          EXTREMITIES:  no edema present.          PSYCH:  affect normal, good eye contact.      Physical Examination:     Assessment:     Assessment:    Postmenopausal bleeding - N95.0 (Primary)      Endometrial mass - N94.89        Plan:    Treatment:      Postmenopausal bleeding          Notes: given high suscpision for endometrial polyp...she has opted for hysteroscopy D&C and polypectomy . rb/a of surgery discussed with the patient including but not limited to infection bleeding perforation of the uterus with the need for further surgery. . pt voiced understanding and desires to proceed . she is advised not to eat anything after midnight. CARDIAC CLEARANCE RECIEVED From Dr. Dani Gobble Croitoru. .      Endometrial mass          Notes: given high suscpision for endometrial polyp...she has opted for hysteroscopy D&C and polypectomy . rb/a of surgery discussed with the patient including but not limited to infection bleeding perforation of the uterus with the need for further surgery. . pt voiced understanding and desires to proceed . she is advised not to eat anything after midnight. CARDIAC CLEARANCE RECIEVED From Dr. Dani Gobble Croitoru. Marland Kitchen

## 2018-02-14 ENCOUNTER — Ambulatory Visit (HOSPITAL_COMMUNITY): Payer: Medicare Other | Admitting: Anesthesiology

## 2018-02-14 ENCOUNTER — Encounter (HOSPITAL_COMMUNITY)
Admission: RE | Disposition: A | Discharge status: Home / Self Care | Payer: Self-pay | Source: Ambulatory Visit | Attending: Obstetrics and Gynecology

## 2018-02-14 ENCOUNTER — Ambulatory Visit (HOSPITAL_COMMUNITY)
Admission: RE | Admit: 2018-02-14 | Discharge: 2018-02-14 | Disposition: A | Discharge status: Home / Self Care | Payer: Medicare Other | Source: Ambulatory Visit | Attending: Obstetrics and Gynecology | Admitting: Obstetrics and Gynecology

## 2018-02-14 ENCOUNTER — Encounter (HOSPITAL_COMMUNITY): Payer: Self-pay

## 2018-02-14 ENCOUNTER — Other Ambulatory Visit: Payer: Self-pay

## 2018-02-14 DIAGNOSIS — G2589 Other specified extrapyramidal and movement disorders: Secondary | ICD-10-CM | POA: Diagnosis not present

## 2018-02-14 DIAGNOSIS — G629 Polyneuropathy, unspecified: Secondary | ICD-10-CM | POA: Insufficient documentation

## 2018-02-14 DIAGNOSIS — I447 Left bundle-branch block, unspecified: Secondary | ICD-10-CM | POA: Insufficient documentation

## 2018-02-14 DIAGNOSIS — I341 Nonrheumatic mitral (valve) prolapse: Secondary | ICD-10-CM | POA: Diagnosis not present

## 2018-02-14 DIAGNOSIS — N858 Other specified noninflammatory disorders of uterus: Secondary | ICD-10-CM | POA: Diagnosis not present

## 2018-02-14 DIAGNOSIS — Z79899 Other long term (current) drug therapy: Secondary | ICD-10-CM | POA: Diagnosis not present

## 2018-02-14 DIAGNOSIS — N950 Postmenopausal bleeding: Secondary | ICD-10-CM

## 2018-02-14 DIAGNOSIS — E669 Obesity, unspecified: Secondary | ICD-10-CM | POA: Diagnosis not present

## 2018-02-14 DIAGNOSIS — I10 Essential (primary) hypertension: Secondary | ICD-10-CM | POA: Insufficient documentation

## 2018-02-14 DIAGNOSIS — Z538 Procedure and treatment not carried out for other reasons: Secondary | ICD-10-CM | POA: Insufficient documentation

## 2018-02-14 DIAGNOSIS — N882 Stricture and stenosis of cervix uteri: Secondary | ICD-10-CM | POA: Diagnosis present

## 2018-02-14 DIAGNOSIS — E785 Hyperlipidemia, unspecified: Secondary | ICD-10-CM | POA: Insufficient documentation

## 2018-02-14 DIAGNOSIS — Z886 Allergy status to analgesic agent status: Secondary | ICD-10-CM | POA: Diagnosis not present

## 2018-02-14 DIAGNOSIS — G20 Parkinson's disease: Secondary | ICD-10-CM | POA: Insufficient documentation

## 2018-02-14 DIAGNOSIS — M8580 Other specified disorders of bone density and structure, unspecified site: Secondary | ICD-10-CM | POA: Insufficient documentation

## 2018-02-14 DIAGNOSIS — M797 Fibromyalgia: Secondary | ICD-10-CM | POA: Diagnosis not present

## 2018-02-14 DIAGNOSIS — I493 Ventricular premature depolarization: Secondary | ICD-10-CM | POA: Diagnosis not present

## 2018-02-14 DIAGNOSIS — N952 Postmenopausal atrophic vaginitis: Secondary | ICD-10-CM | POA: Diagnosis not present

## 2018-02-14 DIAGNOSIS — Z888 Allergy status to other drugs, medicaments and biological substances status: Secondary | ICD-10-CM | POA: Insufficient documentation

## 2018-02-14 DIAGNOSIS — F419 Anxiety disorder, unspecified: Secondary | ICD-10-CM | POA: Insufficient documentation

## 2018-02-14 DIAGNOSIS — Z87891 Personal history of nicotine dependence: Secondary | ICD-10-CM | POA: Insufficient documentation

## 2018-02-14 DIAGNOSIS — Z6830 Body mass index (BMI) 30.0-30.9, adult: Secondary | ICD-10-CM | POA: Insufficient documentation

## 2018-02-14 HISTORY — PX: DILATATION & CURETTAGE/HYSTEROSCOPY WITH MYOSURE: SHX6511

## 2018-02-14 SURGERY — DILATATION & CURETTAGE/HYSTEROSCOPY WITH MYOSURE
Anesthesia: General | Site: Vagina

## 2018-02-14 MED ORDER — OXYCODONE HCL 5 MG PO TABS: 5.0000 mg | ORAL_TABLET | Freq: Once | ORAL | Status: DC | PRN

## 2018-02-14 MED ORDER — MIDAZOLAM HCL 2 MG/2ML IJ SOLN
INTRAMUSCULAR | Status: AC
  Filled 2018-02-14: qty 2

## 2018-02-14 MED ORDER — FENTANYL CITRATE (PF) 100 MCG/2ML IJ SOLN
INTRAMUSCULAR | Status: AC
  Filled 2018-02-14: qty 2

## 2018-02-14 MED ORDER — FENTANYL CITRATE (PF) 100 MCG/2ML IJ SOLN: 25.0000 ug | INTRAMUSCULAR | Status: DC | PRN

## 2018-02-14 MED ORDER — PROPOFOL 10 MG/ML IV BOLUS
INTRAVENOUS | Status: DC | PRN
  Administered 2018-02-14: 150 mg via INTRAVENOUS

## 2018-02-14 MED ORDER — LIDOCAINE HCL (CARDIAC) PF 100 MG/5ML IV SOSY
PREFILLED_SYRINGE | INTRAVENOUS | Status: DC | PRN
  Administered 2018-02-14: 80 mg via INTRAVENOUS

## 2018-02-14 MED ORDER — MIDAZOLAM HCL 2 MG/2ML IJ SOLN
INTRAMUSCULAR | Status: DC | PRN
  Administered 2018-02-14: 1 mg via INTRAVENOUS

## 2018-02-14 MED ORDER — LIDOCAINE HCL (CARDIAC) PF 100 MG/5ML IV SOSY
PREFILLED_SYRINGE | INTRAVENOUS | Status: AC
  Filled 2018-02-14: qty 5

## 2018-02-14 MED ORDER — ONDANSETRON HCL 4 MG/2ML IJ SOLN: 4.0000 mg | Freq: Once | INTRAMUSCULAR | Status: DC | PRN

## 2018-02-14 MED ORDER — LACTATED RINGERS IV SOLN
INTRAVENOUS | Status: DC
  Administered 2018-02-14: 125 mL/h via INTRAVENOUS

## 2018-02-14 MED ORDER — ONDANSETRON HCL 4 MG/2ML IJ SOLN
INTRAMUSCULAR | Status: AC
  Filled 2018-02-14: qty 2

## 2018-02-14 MED ORDER — MEPERIDINE HCL 25 MG/ML IJ SOLN: 6.2500 mg | INTRAMUSCULAR | Status: DC | PRN

## 2018-02-14 MED ORDER — FENTANYL CITRATE (PF) 100 MCG/2ML IJ SOLN
INTRAMUSCULAR | Status: DC | PRN
  Administered 2018-02-14: 50 ug via INTRAVENOUS

## 2018-02-14 MED ORDER — IBUPROFEN 800 MG PO TABS: 800.0000 mg | ORAL_TABLET | Freq: Three times a day (TID) | ORAL | 0 refills | Status: DC | PRN

## 2018-02-14 MED ORDER — SODIUM CHLORIDE 0.9 % IR SOLN
Status: DC | PRN
  Administered 2018-02-14: 3000 mL

## 2018-02-14 MED ORDER — BUPIVACAINE HCL (PF) 0.25 % IJ SOLN
INTRAMUSCULAR | Status: AC
  Filled 2018-02-14: qty 30

## 2018-02-14 MED ORDER — OXYCODONE HCL 5 MG PO TABS: 5.0000 mg | ORAL_TABLET | Freq: Four times a day (QID) | ORAL | 0 refills | Status: DC | PRN

## 2018-02-14 MED ORDER — ONDANSETRON HCL 4 MG/2ML IJ SOLN
INTRAMUSCULAR | Status: DC | PRN
  Administered 2018-02-14: 4 mg via INTRAVENOUS

## 2018-02-14 MED ORDER — OXYCODONE HCL 5 MG/5ML PO SOLN: 5.0000 mg | Freq: Once | ORAL | Status: DC | PRN

## 2018-02-14 MED ORDER — PROPOFOL 10 MG/ML IV BOLUS
INTRAVENOUS | Status: AC
  Filled 2018-02-14: qty 20

## 2018-02-14 SURGICAL SUPPLY — 18 items
CANISTER SUCT 3000ML PPV (MISCELLANEOUS) ×2 IMPLANT
CATH ROBINSON RED A/P 16FR (CATHETERS) ×2 IMPLANT
DEVICE MYOSURE LITE (MISCELLANEOUS) IMPLANT
DEVICE MYOSURE REACH (MISCELLANEOUS) IMPLANT
DILATOR CANAL MILEX (MISCELLANEOUS) ×2 IMPLANT
FILTER ARTHROSCOPY CONVERTOR (FILTER) ×2 IMPLANT
GLOVE BIOGEL M 6.5 STRL (GLOVE) ×4 IMPLANT
GLOVE BIOGEL PI IND STRL 6.5 (GLOVE) ×1 IMPLANT
GLOVE BIOGEL PI IND STRL 7.0 (GLOVE) ×1 IMPLANT
GLOVE BIOGEL PI INDICATOR 6.5 (GLOVE) ×1
GLOVE BIOGEL PI INDICATOR 7.0 (GLOVE) ×1
GOWN STRL REUS W/TWL LRG LVL3 (GOWN DISPOSABLE) ×4 IMPLANT
PACK VAGINAL MINOR WOMEN LF (CUSTOM PROCEDURE TRAY) ×2 IMPLANT
PAD OB MATERNITY 4.3X12.25 (PERSONAL CARE ITEMS) ×2 IMPLANT
SEAL ROD LENS SCOPE MYOSURE (ABLATOR) ×2 IMPLANT
TOWEL OR 17X24 6PK STRL BLUE (TOWEL DISPOSABLE) ×4 IMPLANT
TUBING AQUILEX INFLOW (TUBING) ×2 IMPLANT
TUBING AQUILEX OUTFLOW (TUBING) ×2 IMPLANT

## 2018-02-14 NOTE — Op Note (Signed)
02/14/2018  9:44 AM  PATIENT:  Morgan [name]  67 y.o. female  PRE-OPERATIVE DIAGNOSIS:  N94.89 ENDOMETRIAL MASS N95.0 POSTMENOPAUSAL BLEEDING  POST-OPERATIVE DIAGNOSIS:  endometrial mass, post menopausal/ Cervical Stenosis    PROCEDURE:  Procedure(s) with comments: ATTEMPTED DILATATION & CURETTAGE/HYSTEROSCOPY WITH MYOSURE (N/A) - polyp  SURGEON:  Surgeon(s) and Role:    Christophe Louis, MD - Primary  PHYSICIAN ASSISTANT:   ASSISTANTS: none   ANESTHESIA:   general  EBL:  5 mL   BLOOD ADMINISTERED:none  DRAINS: none   LOCAL MEDICATIONS USED:  NONE  SPECIMEN:  No Specimen  DISPOSITION OF SPECIMEN:  N/A  COUNTS:  YES  TOURNIQUET:  * No tourniquets in log *  DICTATION: .Dragon Dictation  PLAN OF CARE: Discharge to home after PACU  PATIENT DISPOSITION:  PACU - hemodynamically stable.   Delay start of Pharmacological VTE agent (>24hrs) due to surgical blood loss or risk of bleeding: not applicable  Findings: Normal External Genitalia, Vaginal atrophy. Cervical atrophy and stenosis  Procedure: Patient was taken to the operating room where she was placed under general anesthesia.   She was placed in the dorsal lithotomy position. She was prepped and draped in the usual sterile fashion. Time out was performed.  A speculum was placed into the vaginal vault.  The anterior lip of the cervix was grasped with a single-tooth tenaculum.  The cervix was flushed with the vaginal mucosa.. The cervical os could not be easily identified. A cervical os finder was used to help identify the os. Lacrimal Duct probe #1 was used to attempt dilation .  The cervical os finder was again inserted however did not pass through the tissue easily. Due to distorted anatomy and concern for possible perforation by continue the procedure was aborted. The single tooth tenaculum was removed from the patients cervix.  The speculum was removed from the patient's vagina. She was awakened from anesthesia and  taken care  To the recovery  room awake and in stable condition. Sponge lap and needle counts were correct x2.

## 2018-02-14 NOTE — Anesthesia Postprocedure Evaluation (Signed)
Anesthesia Post Note  Patient: Morgan [name]  Procedure(s) Performed: ATTEMPTED DILATATION & CURETTAGE/HYSTEROSCOPY WITH MYOSURE (N/A Vagina )     Patient location during evaluation: PACU Anesthesia Type: General Level of consciousness: awake and alert and oriented Pain management: pain level controlled Vital Signs Assessment: post-procedure vital signs reviewed and stable Respiratory status: spontaneous breathing, nonlabored ventilation and respiratory function stable Cardiovascular status: blood pressure returned to baseline and stable Postop Assessment: no apparent nausea or vomiting Anesthetic complications: no    Last Vitals:  Vitals:   02/14/18 0945 02/14/18 1000  BP: (!) 144/84   Pulse:  92  Resp: 14 18  Temp:    SpO2: 100% 100%    Last Pain:  Vitals:   02/14/18 1000  TempSrc:   PainSc: 0-No pain   Pain Goal: Patients Stated Pain Goal: 4 (02/14/18 0722)               Demitrus Francisco A.

## 2018-02-14 NOTE — Anesthesia Procedure Notes (Signed)
Procedure Name: LMA Insertion Date/Time: 02/14/2018 8:49 AM Performed by: Jonna Munro, CRNA Pre-anesthesia Checklist: Patient identified, Emergency Drugs available, Suction available, Patient being monitored and Timeout performed Patient Re-evaluated:Patient Re-evaluated prior to induction Oxygen Delivery Method: Circle system utilized Preoxygenation: Pre-oxygenation with 100% oxygen Induction Type: IV induction Ventilation: Mask ventilation without difficulty LMA: LMA inserted LMA Size: 4.0 Number of attempts: 1 Placement Confirmation: positive ETCO2 and breath sounds checked- equal and bilateral Dental Injury: Teeth and Oropharynx as per pre-operative assessment

## 2018-02-14 NOTE — H&P (Signed)
Date of Initial H&P:02/13/2018  History reviewed, patient examined, no change in status, stable for surgery.

## 2018-02-14 NOTE — Discharge Instructions (Signed)

## 2018-02-14 NOTE — Transfer of Care (Signed)
Immediate Anesthesia Transfer of Care Note  Patient: Morgan [name]  Procedure(s) Performed: ATTEMPTED DILATATION & CURETTAGE/HYSTEROSCOPY WITH MYOSURE (N/A Vagina )  Patient Location: PACU  Anesthesia Type:General  Level of Consciousness: awake, alert  and oriented  Airway & Oxygen Therapy: Patient Spontanous Breathing and Patient connected to nasal cannula oxygen  Post-op Assessment: Report given to RN and Post -op Vital signs reviewed and stable  Post vital signs: Reviewed and stable  Last Vitals:  Vitals Value Taken Time  BP    Temp    Pulse    Resp    SpO2      Last Pain:  Vitals:   02/14/18 0722  TempSrc: Oral  PainSc: 0-No pain      Patients Stated Pain Goal: 4 (85/92/76 3943)  Complications: No apparent anesthesia complications

## 2018-02-15 ENCOUNTER — Encounter (HOSPITAL_COMMUNITY): Payer: Self-pay | Admitting: Obstetrics and Gynecology

## 2018-02-15 NOTE — Addendum Note (Signed)
Addendum  created 02/15/18 1223 by Jonna Munro, CRNA   Charge Capture section accepted

## 2018-02-25 ENCOUNTER — Other Ambulatory Visit: Payer: Self-pay | Admitting: Neurology

## 2018-03-14 DIAGNOSIS — M8580 Other specified disorders of bone density and structure, unspecified site: Secondary | ICD-10-CM | POA: Diagnosis not present

## 2018-03-14 DIAGNOSIS — M797 Fibromyalgia: Secondary | ICD-10-CM | POA: Diagnosis not present

## 2018-03-14 DIAGNOSIS — F419 Anxiety disorder, unspecified: Secondary | ICD-10-CM | POA: Diagnosis not present

## 2018-03-14 DIAGNOSIS — I10 Essential (primary) hypertension: Secondary | ICD-10-CM | POA: Diagnosis not present

## 2018-03-14 DIAGNOSIS — E785 Hyperlipidemia, unspecified: Secondary | ICD-10-CM | POA: Diagnosis not present

## 2018-03-20 ENCOUNTER — Encounter: Payer: Self-pay | Admitting: Psychology

## 2018-03-20 NOTE — Progress Notes (Signed)
Completed the SCAT application for assistance with transportation and emailed to [email or web address] for processing.

## 2018-03-28 ENCOUNTER — Other Ambulatory Visit: Payer: Self-pay | Admitting: Neurology

## 2018-04-10 DIAGNOSIS — H353211 Exudative age-related macular degeneration, right eye, with active choroidal neovascularization: Secondary | ICD-10-CM | POA: Diagnosis not present

## 2018-04-12 DIAGNOSIS — D2271 Melanocytic nevi of right lower limb, including hip: Secondary | ICD-10-CM | POA: Diagnosis not present

## 2018-04-12 DIAGNOSIS — D1721 Benign lipomatous neoplasm of skin and subcutaneous tissue of right arm: Secondary | ICD-10-CM | POA: Diagnosis not present

## 2018-04-12 DIAGNOSIS — Z808 Family history of malignant neoplasm of other organs or systems: Secondary | ICD-10-CM | POA: Diagnosis not present

## 2018-04-12 DIAGNOSIS — L820 Inflamed seborrheic keratosis: Secondary | ICD-10-CM | POA: Diagnosis not present

## 2018-04-12 DIAGNOSIS — B360 Pityriasis versicolor: Secondary | ICD-10-CM | POA: Diagnosis not present

## 2018-04-12 DIAGNOSIS — Z85820 Personal history of malignant melanoma of skin: Secondary | ICD-10-CM | POA: Diagnosis not present

## 2018-04-17 DIAGNOSIS — H2513 Age-related nuclear cataract, bilateral: Secondary | ICD-10-CM | POA: Diagnosis not present

## 2018-05-10 ENCOUNTER — Ambulatory Visit: Payer: Medicare Other | Attending: Family Medicine

## 2018-05-10 ENCOUNTER — Ambulatory Visit: Payer: Medicare Other | Admitting: Physical Therapy

## 2018-05-10 ENCOUNTER — Telehealth: Payer: Self-pay | Admitting: Physical Therapy

## 2018-05-10 ENCOUNTER — Ambulatory Visit: Payer: Medicare Other | Admitting: Occupational Therapy

## 2018-05-10 DIAGNOSIS — R278 Other lack of coordination: Secondary | ICD-10-CM | POA: Insufficient documentation

## 2018-05-10 DIAGNOSIS — R1310 Dysphagia, unspecified: Secondary | ICD-10-CM | POA: Insufficient documentation

## 2018-05-10 DIAGNOSIS — R471 Dysarthria and anarthria: Secondary | ICD-10-CM

## 2018-05-10 DIAGNOSIS — G20 Parkinson's disease: Secondary | ICD-10-CM

## 2018-05-10 DIAGNOSIS — R2689 Other abnormalities of gait and mobility: Secondary | ICD-10-CM | POA: Insufficient documentation

## 2018-05-10 NOTE — Telephone Encounter (Signed)
Morgan [name] was seen for PD screens today, and PT recommends PT evaluation due to pt's reports of falls and noted posterior LOB at times.  If you agree, please send order via Epic for PT eval and treat.  Thank you.  [name], PT

## 2018-05-10 NOTE — Therapy (Signed)
[facility] [address] [name], [place], [address] Phone: [phone]   Fax:  [phone]  Patient Details  Name: Morgan [name] MRN: [id number] Date of Birth: 1951/05/22 Referring Provider:  Alonza Bogus, DO  Encounter Date: 05/10/2018  Speech Therapy Parkinson's Disease Screen   Decibel Level today average was 69-70 dB  (WNL=70-72 dB) with sound level meter 30cm away from pt's mouth. Pt's conversational volume is on the low end of WNL, and has remained essentially unchanged from her previous screen. SLP provided pt some signs of speech difficulty in Parkinson's disease.  Pt does not report difficulty in swallowing, despite reporting difficulty in her previous screen. Referring MD ordered a modified (MBSS) however pt declined until after the holidays, "And then I forgot about it". SLP provided pt with a list of overt s/s aspiration PNA, and overt s/s of dysphagia. "It'll be nice to have in case I do have a problem come up," pt stated.  Pt agreed that if she has difficulty in the future with speech or swallowing she will contact her MD for a referral for [place] ,[place], [place]  05/10/2018, 8:09 AM  [facility] [address] [place], [place], [address] Phone: [phone]   Fax:  [phone]    ======================== Signs of a speech difficulty:   People asking you to repeat yourself  Inability to get "as loud as I want"  People telling the person to "stop mumbling"   Signs of swallowing difficulty may include:       drooling and poor management of secretions in the mouth;      food or liquid remaining in the mouth after the swallow;      inability to maintain lip closure, leading to food and/or liquids leaking from the mouth;      complaints of food "sticking" in the throat;      complaints of a "fullness" in the neck/throat (i.e.,  "something always feels like its in my throat");      wet or gurgly sounding voice during or after eating or drinking;      frequent, consistent coughing during or right after eating or drinking;      difficulty coordinating breathing and swallowing;      recurring aspiration pneumonia/respiratory infection and/or fever;      extra effort or time needed to chew or swallow;      changes in eating habits-specifically, avoidance of certain foods/drinks; and      weight loss or dehydration from not being able to eat/drink enough.      Signs of Aspiration Pneumonia   . Chest pain/tightness . Fever (can be low grade) . Cough  o With foul-smelling phlegm (sputum) o With sputum containing pus or blood o With greenish sputum . Fatigue  . Shortness of breath  . Wheezing   **IF YOU HAVE THESE SIGNS, CONTACT YOUR DOCTOR OR GO TO THE EMERGENCY DEPARTMENT OR URGENT CARE AS SOON AS POSSIBLE**

## 2018-05-10 NOTE — Therapy (Signed)
[facility] [address] [place], [place], [address] Phone: [phone]   Fax:  [phone]  Patient Details  Name: Morgan [name] MRN: [id number] Date of Birth: [birth year] Referring Provider:  [name], MD  Encounter Date: 05/10/2018  Physical Therapy Parkinson's Disease Screen   Timed Up and Go test: 10.53 sec  10 meter walk test: 9.75 sec (3.36 ft/sec)  5 time sit to stand test:  10.13 sec  Patient would benefit from Physical Therapy evaluation due to some posterior loss of balance, and reported falls.      Pt reports having several (10) falls from squatted position, in the past months.   MARRIOTT,AMY W. 05/10/2018, 8:12 AM  [facility] [address] [place], [place], [address] Phone: [phone]   Fax:  [phone]

## 2018-05-10 NOTE — Telephone Encounter (Signed)
Order entered

## 2018-05-10 NOTE — Patient Instructions (Signed)
Signs of a speech difficulty:   People asking you to repeat yourself  Inability to get "as loud as I want"  People telling the person to "stop mumbling"   Signs of swallowing difficulty may include:       drooling and poor management of secretions in the mouth;      food or liquid remaining in the mouth after the swallow;      inability to maintain lip closure, leading to food and/or liquids leaking from the mouth;      complaints of food "sticking" in the throat;      complaints of a "fullness" in the neck/throat (i.e., "something always feels like its in my throat");      wet or gurgly sounding voice during or after eating or drinking;      frequent, consistent coughing during or right after eating or drinking;      difficulty coordinating breathing and swallowing;      recurring aspiration pneumonia/respiratory infection and/or fever;      extra effort or time needed to chew or swallow;      changes in eating habits-specifically, avoidance of certain foods/drinks; and      weight loss or dehydration from not being able to eat/drink enough.      Signs of Aspiration Pneumonia   . Chest pain/tightness . Fever (can be low grade) . Cough  o With foul-smelling phlegm (sputum) o With sputum containing pus or blood o With greenish sputum . Fatigue  . Shortness of breath  . Wheezing   **IF YOU HAVE THESE SIGNS, CONTACT YOUR DOCTOR OR GO TO THE EMERGENCY DEPARTMENT OR URGENT CARE AS SOON AS POSSIBLE**

## 2018-05-10 NOTE — Therapy (Signed)
[facility] [address] [place], [place], [address] Phone: [phone]   Fax:  [phone]  Patient Details  Name: Morgan [name] MRN: [id number] Date of Birth: [birth year] Referring Provider:  [name], MD  Encounter Date: 05/10/2018 Occupational Therapy Parkinson's Disease Screen  Hand dominance:  RUE  Physical Performance Test item #2 (simulated eating):  10.63 sec  Physical Performance Test item #4 (donning/doffing jacket):  6.94 sec  9-hole peg test:    RUE  19.78 sec        LUE  29.28 sec  Other Comments:  Handwriting is legible and good letter size.  Pt would benefit from occupational therapy evaluation due to  Decline in ADLs, however pt politely declines OT at this time due to personal conflicts and upcoming travel plans.  RINE,KATHRYN 05/10/2018, 8:34 AM  [facility] [address] [place], [place], [address] Phone: [phone]   Fax:  [phone]

## 2018-05-25 NOTE — Progress Notes (Signed)
Morgan [name] was seen today in the movement disorders clinic for neurologic consultation at the request of Dr. [name] at [facility] internal medicine.  She is accompanied by her husband who helps to supplement the history.  She has previously seen neurology, both Dr. Krista Blue and Dr. Doy Mince.  I reviewed some of these notes.  The consultation is for the evaluation of Parkinson's disease.  The patient is a 67 y.o. right handed female reports that her first sx was "active dreaming."  However, the symptom that got her to the dr was L greater than R hand tremor, only when moving hands/drying hair.  This was in 2010.  She was started on carbidopa/levodopa just as a "trial" for 2 weeks and then it was stopped because it worked.  She was told that it was diagnostic of PD.  She was then started on the Mirapex ER 1.5 mg in 07/2009. and it was worked up to 3.0 mg.  She reports compulsive shopping (she overspends her "allowance).  She mostly internets shops.  She has always shopped but she has previously not gone over the "allowance."  She plays the lottery and plays more than previous.  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  She tried to get off the medication but it was unsuccessful because of worsening of PD.  She was placed on Azilect.  She has been on it for about 2 years.  She has not noted any benefits.    It was her impression that it made the mirapex last longer.  She feels strongly that the computer work and shopping became an issue because of the azilect.   04/19/13 update:  The patient presents today with her husband who supplements the history.  Once she discontinued the Azilect, she felt that her compulsive shopping got better.  Her husband feels that she is not exercising enough, but the patient estimates that she perhaps exercises 4 days per week, but not to the intensity that her husband would like.  She had one near fall since our last visit, but she was on a trail in [place] with 2 walking sticks and one  of them something to the ground and her husband caught her.  She is still taking clonazepam at night.  Her husband thinks that she takes it too early and falls asleep on the couch, but she denies this.  There have not been hallucinations.  There has not been nausea or vomiting.  She has been slower.   08/13/13 update:  The pt is f/u for PD.  She is accompanied by her husband who supplements the hx.  She is on mirapex 3 mg daily and carbidopa/levodopa 25/100 was added last visit.  She notices that she has more energy and can do more things, although her husband states that she does not necessarily do those things (including exercise). She noted that the feet "wobble" since being on the medication.  She also notices that her shopping "addiction" has returned.  She has had no falls.  No hallucinations.  No near syncope.  09/24/13 update:  The pt is f/u regarding PD.  She is accompanied by her husband who helps to supplement the history.  Last visit, I decreased her pramipexole to 2.25 mg daily secondary to compulsive shopping.  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  They both  agree that this could be pathologic.  She is now on carbidopa/levodopa 25/100, half a tablet 4 times per day.  Dyskinesia in the foot is better.  She is just getting over the flu.  No hallucinations.  She has not started exercising faithfully, primarily because she has not been feeling well.  No falls.    12/24/13 update:  The patient is following up in regards to her Parkinson's disease.  She is accompanied by her husband who helps to supplement the history.  Her Mirapex has been reduced to 1.5 mg daily secondary to compulsive behaviors.  She did call and ask if she could increase it, but I did not want to do that.  She is still having compulsive behaviors (shopping) and her husband  would like her to discontinue the medication altogether.  She is currently on carbidopa/levodopa 25/100, half a tablet 4 times per day.  She takes at approximately 8 AM, noon, 4 PM and 9 PM.  Unfortunately, the patient had dyskinesia at higher dosages.  Nonetheless, she states that she would rather have the dyskinesia than feel like she currently does.  She feels slow and achy and just does not feel like doing anything.  She was not having any hallucinations.  She does state that she signed up for a 5K in January and would like to start training for that.  She is not having any hallucinations.  No falls.  She is still on Cymbalta, 60 mg daily as well as clonazepam for insomnia.  01/16/14 update:  The patient is accompanied by her husband who helps to supplement the history.  Last visit, we started the patient on Rytary, and she is currently on 145 mg 3 times a day.  Unfortunately, the patient reports that she had insomnia on the medication and felt "woozy" and really overall felt better on the carbidopa/levodopa IR and would like to go back to that medication, even though she dyskinesia on low dose.  She asks me if she can wean the Mirapex and restart the levodopa.  [facility].  She is planning on working up to running a 5K at [employer] in January, 2016.  She remains on the Cymbalta for her fibromyalgia.  03/20/14 update:  The patient returns today as a work in, accompanied by her husband who supplements the history.  Much has happened since our last visit.  Last visit, I tried to decrease the patient's Mirapex ER to 0.75 mg daily, primarily because of compulsive behaviors.  I started her on levodopa.  She called me back and wanted to go up on the Mirapex because of cramping in the thigh, but instead I increased her levodopa to 4 times a day.  The cramping did get better, but she called later with dyskinesia.  At that time my partner was on  call and she reluctantly increased the patient's Mirapex back to 1.5 mg daily and decreased patient's levodopa to twice a day dosing.  The patient later called me with continued dyskinesias and wanted to go further up on the Mirapex, but I have been resistant because of her compulsive spending in the past.  Instead, on 03/07/2014 I asked her to try amantadine and called in a prescription for this.  Unfortunately, she stated that she read the side effects and decided that she was not going to pick this up and take it.  She is now only on Mirapex 1.5 mg  daily and levodopa 25/100, one tablet in the morning and half tablet at night.  She would like to go back up on the Mirapex and her husband states that if he has to disable the computer to deal with her compulsive shopping then he will do that.  04/21/14 update:  Pt returns today for follow up, accompanied by her husband who supplements the history.  She is back on mirapex, 1.5 mg daily.  She did not do well on the requip and was pretty insistent on going back on the mirapex despite previous convulsive behaviors.  She wants to go off levodopa 25/100, and she is on it 1/2 bid but often forgets the second dose.  She is able to drive again.   She states that she thinks that she needs to go back up to the 3.0 of the mirapex.  Her husband is not now completely convinced that the mirapex caused compulsive shopping but thinks that boredom did that.  They are getting ready to go on a 14 day [employer] cruise at the end of October.   07/29/14 update:  Pt returns for f/u, accompanied by her husband who supplements the history.  She is now up to 3.0 mg of mirapex ER a day.   She states that she doesn't feel as good as she did when she was on it previously, but she is doing better than she was on the previous medications.  She has some trouble getting off of the sofa.  No falls.  Is doing circuit II class for PD as well as [employer] but this only takes up 2 days a  week and admits isn't doing CV work the remainder of the days.  Husband asks about finding a counselor for her anxiety, which is primarily associated with driving.  Her compulsive shopping has not gotten worse with higher dose of mirapex (feels that it is better as they just got off a cruise and she was previously shopping for the cruise)  11/04/14 update:  Pt returns for f/u, accompanied by her husband who supplements the history.  She did the [employer] since our last visit.  She does state that she was the last one in but she was able to finish.  I had got a call from the rehab unit that the patient had wanted to hold therapy because of fibromyalgia but the patient states that she wanted to continue therapy and perhaps she cancelled one day and not the entire therapy.  She asks me to renew the order.  Just a few days ago (2 nights) the patient backed down on the mirapex from 3.75 mg to 3.0 mg due to EDS and she wasn't sure if it was due to  the mirapex so she wanted to try the lower dosage.  She would like to get a scooter and asks me about that today  02/05/15 update:  The patient is accompanied by her husband who supplements the history.  She is on Mirapex ER 3.0 mg daily.  She thinks that she wants to try again potentially going up on it.  She feels a little slower than usual.  She is doing PWR classes.     She denies any falls.  No hallucinations.  No lightheadedness or near syncope.  No dyskinesia.  She is supposed to be on Cymbalta for both peripheral neuropathy and fibromyalgia.  She ran out and is now not doing well.  She has been out of it for 2 weeks.  The patient thinks that her fibromyalgia got out of control, she quit exercising so vigorously and then her Parkinson's got worse.  She takes clonazepam as needed for insomnia (3-4 days per week).  They're planning several cruises and several vacations in the near future.  05/26/15 update:  The patient is following up today, accompanied by her husband who  supplements the history.  Last visit, I increased her Mirapex to 3.75 mg daily and restarted her Cymbalta for examination of fibromyalgia and peripheral neuropathy.  She thinks that the cymbalta helped but she thinks the increased mirapex caused more tremor.  She also thinks it causes EDS.  She is falling asleep now in the afternoon and having to take the klonopin late at night to help her sleep (1 mg).  She states that doesn't give her hang over effect.  She wants to go back down again to 3.0 mg of the mirapex.    09/03/15 update:  The patient is following up today, accompanied by her husband who supplements the history.  Last visit, the patient wanted to decrease her pramipexole to 3.0 mg daily.  Last visit, we restarted her Sinemet 25/100, half a tablet 3 times per day.  She states that she ended up d/c her pramipexole all together and increased her Sinemet to 1 po qid.  She feels well as long as she doesn't overdo it.  She remains on Cymbalta, 60 mg daily for peripheral neuropathy and fibromyalgia.  She is also on clonazepam 1 mg nightly for REM behavior disorder and insomnia.  She has been faithfully attending Parkinson's therapies at the neuro-rehabilitation center.  She has not had any falls since our last visit.  No hallucinations.  No lightheadedness or near syncope.  12/17/15 update:  The patient returns today, accompanied by her husband who supplements the history.  Last visit, she was on carbidopa/levodopa 25/100, 1 tablet 4 times per day and had taken herself off of pramipexole.  She has put herself back on pramipexole ER 0.75 mg daily and is on carbidopa/levodopa 25/100 3 times per day.  She denies hallucinations.  She had no falls but did trip over a stool in her closet but didn't fall.   She is doing [employer] class one day a week.  She is riding the recumbant bike.  She is scheduled for her assessment next week for PT/OT.    03/10/16 update:  The patient returns today, accompanied by her  husband who supplements the history.  I have reviewed multiple records since our last visit.  She has seen cardiology and was diagnosed with a left bundle branch block and PVCs which were increased by hypokalemia which was from hydrochlorothiazide.  It was recommended that she decrease the dose of the the HCTZ.  She had an echocardiogram on 01/11/2016 was unremarkable with a normal ejection fraction of 50-55%.  She is on pramipexole ER 0.75 mg daily and carbidopa/levodopa 25/100, one tablet 3 times per day.  She is on clonazepam for REM behavior disorder, 1 mg at night.  She has not had falls since our last visit.  She is having "aching" of the legs.  She was taken off of the lipitor by the PCP but it didn't help.  She originally thought that it was her fibromyalgia but she now wonders if it was the addition of levodopa.  No hallucinations.  No lightheadedness or near syncope.  Continues to chew nicotine gum but states that she is trying to cut down on it.  07/07/16 update:  Patient follows up today, accompanied by her husband who supplements the history.  She is on carbidopa/levodopa 25/100, one tablet 3 times per day (7am/2pm/8pm) and pramipexole ER, 0.75 mg daily.  She is also on clonazepam for REM behavior disorder, 1 mg at night.  She had one fall since last visit; she tripped over the dog.  She denies any hallucinations.  She denies lightheadedness or near syncope.  She remains on Cymbalta for her fibromyalgia, but gabapentin was started last visit and she has worked up to 100 mg, 2 po tid.  The gabapentin helped "a little" but not a lot.  The in home PT helped some.  Her and her husband continue to travel significantly via their RV.  10/07/16 update:  Patient follows up today, accompanied by her husband who supplements the history.  Patient remains on carbidopa/levodopa 25/100, one tablet 3 times per day.  She is still on pramipexole ER, 0.75 mg daily.  One fall.  Was practicing a stretch off a chair and she  rolled off of the chair.   Doing well with clonazepam for REM behavior disorder, 1 mg at night.  Last visit I increased her gabapentin to 300 mg 3 times a day in addition to her Cymbalta.  She is on these both for peripheral neuropathy as well as fibromyalgia.  She reports that she went up to 400 mg 3 times per day on the gabapentin and it helped.  She denies hallucinations.  She denies lightheadedness or near syncope.  After seeing speech therapy, a modified barium swallow was recommended, but when I tried to order this, the patient asked me to hold on ordering it.  She still does not think that this is a particular issue and wants to wait.  She is in [employer].    01/20/17 update:  Patient seen today in follow-up, accompanied by her husband who supplements the history.  The patient is on carbidopa/levodopa 25/100, one tablet but she increased that to 4 times per day in addition to pramipexole ER, 0.75 mg daily.  She has had no sleep attacks.  No hallucinations.  No compulsive behaviors.  One fall when she was squatting in the closet.  Didn't get hurt.  She is on clonazepam for REM behavior disorder, 1 mg at night.  She continues to use gabapentin, 400 mg 3 times a day for peripheral neuropathy as well as her fibromyalgia. C/o pain in the legs.   She is on Cymbalta for her fibromyalgia as well.  She is exercising with rock steady boxing 1-2 times per week.  05/23/17 update:  Pt seen in f/u for PD.  This patient is accompanied in the office by her spouse who supplements the history.  Pt on carbidopa/levodopa 25/100, 1/2/1 and pramipexole ER, 0.75 mg daily.  Pt had some trouble walking on the cruise but some of that was "sea legs."  Pt denies lightheadedness, near syncope.  No hallucinations.  Mood has been good.  On cymbalta both for mood and for fibromyalgia.  Tried dry needling but it wasn't helpful.   Asks for lidoderm patch for legs.  Has used in past.  Able to pay for that self pay if I write.   Continues to travel extensively with her husband.  Is no longer going to [place] because she had shoulder injuries.  She is in [employer] classes and she is going to the [facility] for gentle yoga.    Been to the neurorehab center in august  for therapies and I reviewed those visit notes.   States that she is still going.  Is having R shoulder/arm pain and in therapy for that.    She has a group of PD patients that they call "movers and shakers" that she hangs out with and goes to lunch with and is planning a christmas party.    09/21/17 update:  Pt seen in f/u.  This patient is accompanied in the office by her spouse who supplements the history.  On carbidopa/levodopa 25/100, 1/2/1 and pramipexole ER, 0.75 mg daily.  She states that carbidopa/levodopa 25/100 helps the pain in her legs and she wonders if she should increase it.  No compulsive behaviors.  No sleep attacks.  Pt denies falls.  Pt denies lightheadedness, near syncope.  No hallucinations.  Mood has been good.  On cymbalta for mood and fibromyalgia and works fairly well.  Also on gabapentin 400 mg tid for nerve pain.  She wonders if that works, thinks that levodopa helps with this (why she wants to increase it) and wants to d/c it.  She is doing silver sneakers classes at the [facility] 3 days per week and doing PWR classes one day per week and yoga one day per week.    01/25/18 update: Patient is seen today in follow-up.  She is accompanied by her husband who supplements the history.  She is on carbidopa/levodopa 25/100, 2/2/1 and pramipexole ER 0.75 mg daily.  She has no compulsive behaviors or sleep attacks.  No hallucinations.  No lightheadedness or near syncope.  No falls but did recently trip over her dog and hit her hand on the door.  She didn't actually fall to the ground.  Records are reviewed since our last visit.  She is scheduled for a D&C and hysteroscopy on June 19.  She quit exercising when she had vaginal bleeding and before she found out it was just polyps.   She did much better when she was exercising in terms of physical ability.  She was previously exercising 3 days per week.  She isn't sure why she hasn't got back to it, as no restrictions from gyn.  She is also on gabapentin.  She thinks that the pain in her thighs is somewhat worse.  Does not know if that is related to the fact she is not exercising as much.  She wonders if the gabapentin is not working as well.  05/29/18 update:  Pt seen in follow-up for Parkinson's disease.  She is accompanied by her husband who supplements the history.  Remains on carprofen/levodopa 25/100, 2 tablets in the morning, 2 in the afternoon and 1 in the evening.  She is also on pramipexole ER 0.75 mg daily.  No sleep attacks.  No lightheadedness or near syncope.  No hallucinations.  Has had numerous falls but reports that they took in mother in laws dog and it keeps tripping her.  talked about the importance last visit of getting back to regular exercise and she is now using the U step faithfully 2-3 days/week.  She was sleeping well with her clonazepam but she ran out of that and tried xanax and asks if we could change to that.  She had her therapy screens in September and I have reviewed those.  Physical and Occupational Therapy were recommended.  She is not smoking cigarettes, but is consistently chewing nicotine-containing gum.  PREVIOUS MEDICATIONS: Mirapex and azilect; levodopa for a few weeks to "prove" benefit; requip  ALLERGIES:   Allergies  Allergen Reactions  . Other Anaphylaxis    "Anti Seizure Medications"  . Phenergan [Promethazine Hcl] Other (See Comments)    Makes Parkinson worse  . Sporanox [Itraconazole] Hives  . Tylenol [Acetaminophen] Other (See Comments)    "I just get weird"    CURRENT MEDICATIONS:  Current Outpatient Medications on File Prior to Visit  Medication Sig Dispense Refill  . ALPRAZolam (XANAX) 0.25 MG tablet Take 0.25 mg by mouth daily as needed for anxiety.     Marland Kitchen aspirin 325 MG  tablet Take 325-650 mg by mouth daily as needed (for headaches/pain.).     Marland Kitchen atorvastatin (LIPITOR) 10 MG tablet Take 10 mg by mouth at bedtime.    Marland Kitchen CALCIUM CITRATE PO Take 2 tablets by mouth daily. BariMelts Calcium Citrate    . carbidopa-levodopa (SINEMET IR) 25-100 MG tablet TAKE 2 TABLETS IN THE      MORNING, 2 TABLETS IN THE  AFTERNOON, 1 TABLET IN THE EVENING 450 tablet 1  . Cholecalciferol (VITAMIN D-3) 5000 units TABS Take 5,000 Units by mouth 2 (two) times daily.    . clonazePAM (KLONOPIN) 1 MG tablet TAKE 1 TABLET EVERY DAY AT BEDTIME 90 tablet 1  . Coenzyme Q10-Vitamin E (QUNOL ULTRA COQ10 PO) Take 1 capsule by mouth daily.    . CYMBALTA 60 MG capsule Take 1 capsule (60 mg total) by mouth daily. 90 capsule 1  . gabapentin (NEURONTIN) 400 MG capsule Take 1 capsule (400 mg total) by mouth 3 (three) times daily. 270 capsule 5  . Magnesium 400 MG TABS Take 400 mg by mouth at bedtime.    . Multiple Vitamins-Minerals (PRESERVISION AREDS 2 PO) Take 1 tablet by mouth 2 (two) times daily.    . Polyethylene Glycol 400 (BLINK TEARS) 0.25 % SOLN Place 1 drop into both eyes 3 (three) times daily as needed (for dry/irritated eyes.).    Marland Kitchen raloxifene (EVISTA) 60 MG tablet Take 60 mg by mouth daily.     No current facility-administered medications on file prior to visit.     PAST MEDICAL HISTORY:   Past Medical History:  Diagnosis Date  . Anxiety   . Fibromyalgia   . Hypercholesteremia    controlled on medication  . Hyperlipidemia   . Hypertension    no current meds  . LBBB (left bundle branch block)   . Melanoma ([place])   . MVP (mitral valve prolapse)   . Osteopenia   . Parkinson disease ([place])   . SVD (spontaneous vaginal delivery)    x 2    PAST SURGICAL HISTORY:   Past Surgical History:  Procedure Laterality Date  . BLEPHAROPLASTY    . cold knife conization    . COLONOSCOPY     FH colon CA  . DILATATION & CURETTAGE/HYSTEROSCOPY WITH MYOSURE N/A 02/14/2018   Procedure: ATTEMPTED  DILATATION & CURETTAGE/HYSTEROSCOPY WITH MYOSURE;  Surgeon: Christophe Louis, MD;  Location: [facility];  Service: Gynecology;  Laterality: N/A;  polyp  . melanoma cancer     removed from left leg  . TONSILLECTOMY    . TUBAL LIGATION    . WISDOM TOOTH EXTRACTION      SOCIAL HISTORY:   Social History   Socioeconomic History  . Marital status: Married    Spouse name: Not on file  . Number of children: Not on file  . Years of education: Not on file  . Highest education level: Not on file  Occupational History  . 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 Financial resource strain: Not on file  . Food insecurity:    Worry: Not on file    Inability: Not on file  . Transportation needs:    Medical: Not on file    Non-medical: Not on file  Tobacco Use  . Smoking status: Former Smoker    Packs/day: 0.25    Types: Cigarettes    Last attempt to quit: 09/29/1997    Years since quitting: 20.6  . Smokeless tobacco: Never Used  . Tobacco comment: chews [place], 10-12/day  Substance and Sexual Activity  . Alcohol use: Yes    Comment: Occasional wine  . Drug use: No  . Sexual activity: Not on file  Lifestyle  . Physical activity:    Days per week: Not on file    Minutes per session: Not on file  . Stress: Not on file  Relationships  . Social connections:    Talks on phone: Not on file    Gets together: Not on file    Attends religious service: Not on file    Active member of club or organization: Not on file    Attends meetings of clubs or organizations: Not on file    Relationship status: Not on file  . Intimate partner violence:    Fear of current or ex partner: Not on file    Emotionally abused: Not on file    Physically abused: Not on file    Forced sexual activity: Not on file  Other Topics Concern  . Not on file  Social History Narrative  . Not on file    FAMILY HISTORY:   Family Status  Relation Name Status  . Mother  Deceased        colon CA  . Father  Alive       healthy  . Brother  Alive       healthy  . Sister  Alive       4, healthy  . Child  Alive       healthy  . MGM  (Not Specified)    ROS:  Review of Systems  Constitutional: Negative.   HENT: Negative.   Eyes: Negative.   Respiratory: Negative.   Cardiovascular: Negative.   Genitourinary: Negative.   Musculoskeletal: Positive for falls.  Skin: Negative.      PHYSICAL EXAMINATION:    VITALS:   Vitals:   05/29/18 1506  BP: 132/74  Pulse: 96  SpO2: 96%  Weight: 176 lb (79.8 kg)  Height: 5\' 5"  (1.651 m)    GEN:  The patient appears stated age and is in NAD. HEENT:  Normocephalic, atraumatic.  The mucous membranes are moist. The superficial temporal arteries are without ropiness or tenderness. CV:  RRR Lungs:  CTAB Neck/HEME:  There are no carotid bruits bilaterally.  Neurological examination:  Orientation: The patient is alert and oriented x3. Cranial nerves: There is good facial symmetry. The speech is fluent and clear. Soft palate rises symmetrically and there is no tongue deviation. Hearing is intact to conversational tone. Sensation: Sensation is intact to light touch throughout Motor: Strength is 5/5 in the bilateral upper and lower extremities.   Shoulder shrug is equal and symmetric.  There is no pronator drift.  Movement examination: Tone: There is normal tone in the upper and lower extremities. Abnormal movements: No tremor or dyskinesia today Coordination:  There is no decremation with RAM's, with hand opening and closing, finger taps, alteration of supination/pronation of  the forearm or with turning in a light bulb bilaterally. Gait and Station: The patient has mild difficulty arising out of a deep-seated chair without the use of the hands but is able to accomplish the task if she moves to the edge of the chair and places her nose over her toes. The patient's stride length is good, but she drags the right leg and shuffles  intermittently.     LABS:  Lab Results  Component Value Date   TSH 0.80 01/15/2013   Lab Results  Component Value Date   [id number] 882 01/15/2013    SPEP/UPEP negative.    Lab Results  Component Value Date   FOLATE 22.1 01/15/2013     ASSESSMENT/PLAN:  1.  Idiopathic akinetic rigid Parkinson's disease.    -We discussed the diagnosis as well as pathophysiology of the disease.  We discussed treatment options as well as prognostic indicators.  Patient education was provided.  -Continue on carbidopa/levodopa 25/100, 2/2/1.  -  She is on mirapex er 0.75 mg daily.  She will remain on this medication.  We discussed risk, benefits, and side effects.  -Congratulated her on starting back to exercise.  Encouraged her to make it 3 or 4 days/week instead of 2-3.  -Invited to the PARTS program.  -Orders placed for therapy, but the patient does tell me she will be gone much of October so we will have to delay that.  -SCAT filled out 2.  Fibromyalgia  -on cymbalta.  This was refilled today.  -dont think that myalgias are from the addition of levodopa last September.  Stopping lipitor didn't help.  -continue gabapentin 400 mg 3 times per day.   -dry needling wasn't helpful 3.  probable peripheral neuropathy, idiopathic.  -Back on Cymbalta both for peripheral neuropathy as well as fibromyalgia. 4.  history of melanoma.  -There is some old literature that suggests that the addition of levodopa can increase risk of return melanoma, but new literature suggests that this is not the etiology.   -She is having skin examininations 5.  history of tobacco abuse.  -Talked today about the importance of discontinuing the nicotine gum. 6.  Insomnia with some RBD  -Asked about changing back to Xanax, but I really do not want to do that.  Will remain on clonazepam, 1 mg nightly.  7.  Follow up is anticipated in the next few months, sooner should new neurologic issues arise.  Much greater than 50% of  this visit was spent in counseling and coordinating care.  Total face to face time:  25 min

## 2018-05-29 ENCOUNTER — Ambulatory Visit (INDEPENDENT_AMBULATORY_CARE_PROVIDER_SITE_OTHER): Payer: Medicare Other | Admitting: Neurology

## 2018-05-29 ENCOUNTER — Encounter: Payer: Self-pay | Admitting: Neurology

## 2018-05-29 VITALS — BP 132/74 | HR 96 | Ht 65.0 in | Wt 176.0 lb

## 2018-05-29 DIAGNOSIS — G4701 Insomnia due to medical condition: Secondary | ICD-10-CM

## 2018-05-29 DIAGNOSIS — G20 Parkinson's disease: Secondary | ICD-10-CM

## 2018-05-29 DIAGNOSIS — M797 Fibromyalgia: Secondary | ICD-10-CM | POA: Diagnosis not present

## 2018-05-29 MED ORDER — CYMBALTA 60 MG PO CPEP: 60.0000 mg | ORAL_CAPSULE | Freq: Every day | ORAL | 1 refills | Status: DC

## 2018-06-20 ENCOUNTER — Other Ambulatory Visit: Payer: Self-pay | Admitting: Neurology

## 2018-06-25 DIAGNOSIS — J069 Acute upper respiratory infection, unspecified: Secondary | ICD-10-CM | POA: Diagnosis not present

## 2018-06-26 DIAGNOSIS — H353211 Exudative age-related macular degeneration, right eye, with active choroidal neovascularization: Secondary | ICD-10-CM | POA: Diagnosis not present

## 2018-06-26 DIAGNOSIS — H353121 Nonexudative age-related macular degeneration, left eye, early dry stage: Secondary | ICD-10-CM | POA: Diagnosis not present

## 2018-06-26 DIAGNOSIS — H43813 Vitreous degeneration, bilateral: Secondary | ICD-10-CM | POA: Diagnosis not present

## 2018-08-03 DIAGNOSIS — H353122 Nonexudative age-related macular degeneration, left eye, intermediate dry stage: Secondary | ICD-10-CM | POA: Diagnosis not present

## 2018-08-03 DIAGNOSIS — H43813 Vitreous degeneration, bilateral: Secondary | ICD-10-CM | POA: Diagnosis not present

## 2018-08-03 DIAGNOSIS — H2513 Age-related nuclear cataract, bilateral: Secondary | ICD-10-CM | POA: Diagnosis not present

## 2018-08-03 DIAGNOSIS — H353212 Exudative age-related macular degeneration, right eye, with inactive choroidal neovascularization: Secondary | ICD-10-CM | POA: Diagnosis not present

## 2018-08-13 DIAGNOSIS — Z1231 Encounter for screening mammogram for malignant neoplasm of breast: Secondary | ICD-10-CM | POA: Diagnosis not present

## 2018-08-24 ENCOUNTER — Other Ambulatory Visit: Payer: Self-pay | Admitting: Neurology

## 2018-08-31 ENCOUNTER — Other Ambulatory Visit: Payer: Self-pay | Admitting: Neurology

## 2018-09-12 DIAGNOSIS — H353122 Nonexudative age-related macular degeneration, left eye, intermediate dry stage: Secondary | ICD-10-CM | POA: Diagnosis not present

## 2018-09-12 DIAGNOSIS — H2513 Age-related nuclear cataract, bilateral: Secondary | ICD-10-CM | POA: Diagnosis not present

## 2018-09-12 DIAGNOSIS — H353212 Exudative age-related macular degeneration, right eye, with inactive choroidal neovascularization: Secondary | ICD-10-CM | POA: Diagnosis not present

## 2018-09-12 DIAGNOSIS — H43813 Vitreous degeneration, bilateral: Secondary | ICD-10-CM | POA: Diagnosis not present

## 2018-09-19 DIAGNOSIS — D259 Leiomyoma of uterus, unspecified: Secondary | ICD-10-CM | POA: Diagnosis not present

## 2018-09-19 DIAGNOSIS — N9489 Other specified conditions associated with female genital organs and menstrual cycle: Secondary | ICD-10-CM | POA: Diagnosis not present

## 2018-10-04 DIAGNOSIS — E785 Hyperlipidemia, unspecified: Secondary | ICD-10-CM | POA: Diagnosis not present

## 2018-10-04 DIAGNOSIS — N3281 Overactive bladder: Secondary | ICD-10-CM | POA: Diagnosis not present

## 2018-10-04 DIAGNOSIS — Z1389 Encounter for screening for other disorder: Secondary | ICD-10-CM | POA: Diagnosis not present

## 2018-10-04 DIAGNOSIS — M797 Fibromyalgia: Secondary | ICD-10-CM | POA: Diagnosis not present

## 2018-10-04 DIAGNOSIS — F419 Anxiety disorder, unspecified: Secondary | ICD-10-CM | POA: Diagnosis not present

## 2018-10-04 DIAGNOSIS — M810 Age-related osteoporosis without current pathological fracture: Secondary | ICD-10-CM | POA: Diagnosis not present

## 2018-10-04 DIAGNOSIS — G20 Parkinson's disease: Secondary | ICD-10-CM | POA: Diagnosis not present

## 2018-10-04 DIAGNOSIS — Z0000 Encounter for general adult medical examination without abnormal findings: Secondary | ICD-10-CM | POA: Diagnosis not present

## 2018-11-05 NOTE — Progress Notes (Signed)
Morgan [name] was seen today in Morgan movement disorders clinic for neurologic consultation at Morgan request of Dr. [name] at [facility] internal medicine.  She is accompanied by her husband who helps to supplement Morgan history.  She has previously seen neurology, both Dr. Krista Blue and Dr. Doy Mince.  I reviewed some of these notes.  Morgan consultation is for Morgan evaluation of Parkinson's disease.  Morgan [name] is a 68 y.o. right handed female reports that her first sx was "active dreaming."  However, Morgan symptom that got her to Morgan dr was L greater than R hand tremor, only when moving hands/drying hair.  This was in 2010.  She was started on carbidopa/levodopa just as a "trial" for 2 weeks and then it was stopped because it worked.  She was told that it was diagnostic of PD.  She was then started on Morgan Mirapex ER 1.5 mg in 07/2009. and it was worked up to 3.0 mg.  She reports compulsive shopping (she overspends her "allowance).  She mostly internets shops.  She has always shopped but she has previously not gone over Morgan "allowance."  She plays Morgan lottery and plays more than previous.  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  She tried to get off Morgan medication but it was unsuccessful because of worsening of PD.  She was placed on Azilect.  She has been on it for about 2 years.  She has not noted any benefits.    It was her impression that it made Morgan mirapex last longer.  She feels strongly that Morgan computer work and shopping became an issue because of Morgan azilect.   04/19/13 update:  Morgan [name] presents today with her husband who supplements Morgan history.  Once she discontinued Morgan Azilect, she felt that her compulsive shopping got better.  Her husband feels that she is not exercising enough, but Morgan [name] estimates that she perhaps exercises 4 days per week, but not to Morgan intensity that her husband would like.  She had one near fall since our last visit, but she was on a trail in [place] with 2 walking sticks and one  of them something to Morgan ground and her husband caught her.  She is still taking clonazepam at night.  Her husband thinks that she takes it too early and falls asleep on Morgan couch, but she denies this.  There have not been hallucinations.  There has not been nausea or vomiting.  She has been slower.   08/13/13 update:  Morgan pt is f/u for PD.  She is accompanied by her husband who supplements Morgan hx.  She is on mirapex 3 mg daily and carbidopa/levodopa 25/100 was added last visit.  She notices that she has more energy and can do more things, although her husband states that she does not necessarily do those things (including exercise). She noted that Morgan feet "wobble" since being on Morgan medication.  She also notices that her shopping "addiction" has returned.  She has had no falls.  No hallucinations.  No near syncope.  09/24/13 update:  Morgan pt is f/u regarding PD.  She is accompanied by her husband who helps to supplement Morgan history.  Last visit, I decreased her pramipexole to 2.25 mg daily secondary to compulsive shopping.  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  They both  agree that this could be pathologic.  She is now on carbidopa/levodopa 25/100, half a tablet 4 times per day.  Dyskinesia in Morgan foot is better.  She is just getting over Morgan flu.  No hallucinations.  She has not started exercising faithfully, primarily because she has not been feeling well.  No falls.    12/24/13 update:  Morgan [name] is following up in regards to her Parkinson's disease.  She is accompanied by her husband who helps to supplement Morgan history.  Her Mirapex has been reduced to 1.5 mg daily secondary to compulsive behaviors.  She did call and ask if she could increase it, but I did not want to do that.  She is still having compulsive behaviors (shopping) and her husband  would like her to discontinue Morgan medication altogether.  She is currently on carbidopa/levodopa 25/100, half a tablet 4 times per day.  She takes at approximately 8 AM, noon, 4 PM and 9 PM.  Unfortunately, Morgan [name] had dyskinesia at higher dosages.  Nonetheless, she states that she would rather have Morgan dyskinesia than feel like she currently does.  She feels slow and achy and just does not feel like doing anything.  She was not having any hallucinations.  She does state that she signed up for a 5K in January and would like to start training for that.  She is not having any hallucinations.  No falls.  She is still on Cymbalta, 60 mg daily as well as clonazepam for insomnia.  01/16/14 update:  Morgan [name] is accompanied by her husband who helps to supplement Morgan history.  Last visit, we started Morgan [name] on Rytary, and she is currently on 145 mg 3 times a day.  Unfortunately, Morgan [name] reports that she had insomnia on Morgan medication and felt "woozy" and really overall felt better on Morgan carbidopa/levodopa IR and would like to go back to that medication, even though she dyskinesia on low dose.  She asks me if she can wean Morgan Mirapex and restart Morgan levodopa.  She is planning on starting a [facility] program at [facility] which is a bicycle program.  She is planning on working up to running a 5K at [employer] in January, 2016.  She remains on Morgan Cymbalta for her fibromyalgia.  03/20/14 update:  Morgan [name] returns today as a work in, accompanied by her husband who supplements Morgan history.  Much has happened since our last visit.  Last visit, I tried to decrease Morgan [name]'s Mirapex ER to 0.75 mg daily, primarily because of compulsive behaviors.  I started her on levodopa.  She called me back and wanted to go up on Morgan Mirapex because of cramping in Morgan thigh, but instead I increased her levodopa to 4 times a day.  Morgan cramping did get better, but she called later with dyskinesia.  At that time my partner was on  call and she reluctantly increased Morgan [name]'s Mirapex back to 1.5 mg daily and decreased [name]'s levodopa to twice a day dosing.  Morgan [name] later called me with continued dyskinesias and wanted to go further up on Morgan Mirapex, but I have been resistant because of her compulsive spending in Morgan past.  Instead, on 03/07/2014 I asked her to try amantadine and called in a prescription for this.  Unfortunately, she stated that she read Morgan side effects and decided that she was not going to pick this up and take it.  She is now only on Mirapex 1.5 mg  daily and levodopa 25/100, one tablet in Morgan morning and half tablet at night.  She would like to go back up on Morgan Mirapex and her husband states that if he has to disable Morgan computer to deal with her compulsive shopping then he will do that.  04/21/14 update:  Pt returns today for follow up, accompanied by her husband who supplements Morgan history.  She is back on mirapex, 1.5 mg daily.  She did not do well on Morgan requip and was pretty insistent on going back on Morgan mirapex despite previous convulsive behaviors.  She wants to go off levodopa 25/100, and she is on it 1/2 bid but often forgets Morgan second dose.  She is able to drive again.   She states that she thinks that she needs to go back up to Morgan 3.0 of Morgan mirapex.  Her husband is not now completely convinced that Morgan mirapex caused compulsive shopping but thinks that boredom did that.  They are getting ready to go on a 14 day [employer] cruise at Morgan end of October.   07/29/14 update:  Pt returns for f/u, accompanied by her husband who supplements Morgan history.  She is now up to 3.0 mg of mirapex ER a day.   She states that she doesn't feel as good as she did when she was on it previously, but she is doing better than she was on Morgan previous medications.  She has some trouble getting off of Morgan sofa.  No falls.  Is doing circuit II class for PD as well as [employer] but this only takes up 2 days a  week and admits isn't doing CV work Morgan remainder of Morgan days.  Husband asks about finding a counselor for her anxiety, which is primarily associated with driving.  Her compulsive shopping has not gotten worse with higher dose of mirapex (feels that it is better as they just got off a cruise and she was previously shopping for Morgan cruise)  11/04/14 update:  Pt returns for f/u, accompanied by her husband who supplements Morgan history.  She did Morgan [employer] since our last visit.  She does state that she was Morgan last one in but she was able to finish.  I had got a call from [facility] rehab unit that Morgan [name] had wanted to hold therapy because of fibromyalgia but Morgan [name] states that she wanted to continue therapy and perhaps she cancelled one day and not Morgan entire therapy.  She asks me to renew Morgan order.  Just a few days ago (2 nights) Morgan [name] backed down on Morgan mirapex from 3.75 mg to 3.0 mg due to EDS and she wasn't sure if it was due to  Morgan mirapex so she wanted to try Morgan lower dosage.  She would like to get a scooter and asks me about that today  02/05/15 update:  Morgan [name] is accompanied by her husband who supplements Morgan history.  She is on Mirapex ER 3.0 mg daily.  She thinks that she wants to try again potentially going up on it.  She feels a little slower than usual.  She is doing PWR classes.     She denies any falls.  No hallucinations.  No lightheadedness or near syncope.  No dyskinesia.  She is supposed to be on Cymbalta for both peripheral neuropathy and fibromyalgia.  She ran out and is now not doing well.  She has been out of it for 2 weeks.  Morgan [name] thinks that her fibromyalgia got out of control, she quit exercising so vigorously and then her Parkinson's got worse.  She takes clonazepam as needed for insomnia (3-4 days per week).  They're planning several cruises and several vacations in Morgan near future.  05/26/15 update:  Morgan [name] is following up today, accompanied by her husband who  supplements Morgan history.  Last visit, I increased her Mirapex to 3.75 mg daily and restarted her Cymbalta for examination of fibromyalgia and peripheral neuropathy.  She thinks that Morgan cymbalta helped but she thinks Morgan increased mirapex caused more tremor.  She also thinks it causes EDS.  She is falling asleep now in Morgan afternoon and having to take Morgan klonopin late at night to help her sleep (1 mg).  She states that doesn't give her hang over effect.  She wants to go back down again to 3.0 mg of Morgan mirapex.    09/03/15 update:  Morgan [name] is following up today, accompanied by her husband who supplements Morgan history.  Last visit, Morgan [name] wanted to decrease her pramipexole to 3.0 mg daily.  Last visit, we restarted her Sinemet 25/100, half a tablet 3 times per day.  She states that she ended up d/c her pramipexole all together and increased her Sinemet to 1 po qid.  She feels well as long as she doesn't overdo it.  She remains on Cymbalta, 60 mg daily for peripheral neuropathy and fibromyalgia.  She is also on clonazepam 1 mg nightly for REM behavior disorder and insomnia.  She has been faithfully attending Parkinson's therapies [facility].  She has not had any falls since our last visit.  No hallucinations.  No lightheadedness or near syncope.  12/17/15 update:  Morgan [name] returns today, accompanied by her husband who supplements Morgan history.  Last visit, she was on carbidopa/levodopa 25/100, 1 tablet 4 times per day and had taken herself off of pramipexole.  She has put herself back on pramipexole ER 0.75 mg daily and is on carbidopa/levodopa 25/100 3 times per day.  She denies hallucinations.  She had no falls but did trip over a stool in her closet but didn't fall.   She is doing P[employer] class one day a week.  She is riding Morgan recumbant bike.  She is scheduled for her assessment next week for PT/OT.    03/10/16 update:  Morgan [name] returns today, accompanied by her  husband who supplements Morgan history.  I have reviewed multiple records since our last visit.  She has seen cardiology and was diagnosed with a left bundle branch block and PVCs which were increased by hypokalemia which was from hydrochlorothiazide.  It was recommended that she decrease Morgan dose of Morgan Morgan HCTZ.  She had an echocardiogram on 01/11/2016 was unremarkable with a normal ejection fraction of 50-55%.  She is on pramipexole ER 0.75 mg daily and carbidopa/levodopa 25/100, one tablet 3 times per day.  She is on clonazepam for REM behavior disorder, 1 mg at night.  She has not had falls since our last visit.  She is having "aching" of Morgan legs.  She was taken off of Morgan lipitor by Morgan PCP but it didn't help.  She originally thought that it was her fibromyalgia but she now wonders if it was Morgan addition of levodopa.  No hallucinations.  No lightheadedness or near syncope.  Continues to chew nicotine gum but states that she is trying to cut down on it.  07/07/16 update:  [name] follows up today, accompanied by her husband who supplements Morgan history.  She is on carbidopa/levodopa 25/100, one tablet 3 times per day (7am/2pm/8pm) and pramipexole ER, 0.75 mg daily.  She is also on clonazepam for REM behavior disorder, 1 mg at night.  She had one fall since last visit; she tripped over Morgan dog.  She denies any hallucinations.  She denies lightheadedness or near syncope.  She remains on Cymbalta for her fibromyalgia, but gabapentin was started last visit and she has worked up to 100 mg, 2 po tid.  Morgan gabapentin helped "a little" but not a lot.  Morgan in home PT helped some.  Her and her husband continue to travel significantly via their RV.  10/07/16 update:  [name] follows up today, accompanied by her husband who supplements Morgan history.  [name] remains on carbidopa/levodopa 25/100, one tablet 3 times per day.  She is still on pramipexole ER, 0.75 mg daily.  One fall.  Was practicing a stretch off a chair and she  rolled off of Morgan chair.   Doing well with clonazepam for REM behavior disorder, 1 mg at night.  Last visit I increased her gabapentin to 300 mg 3 times a day in addition to her Cymbalta.  She is on these both for peripheral neuropathy as well as fibromyalgia.  She reports that she went up to 400 mg 3 times per day on Morgan gabapentin and it helped.  She denies hallucinations.  She denies lightheadedness or near syncope.  After seeing speech therapy, a modified barium swallow was recommended, but when I tried to order this, Morgan [name] asked me to hold on ordering it.  She still does not think that this is a particular issue and wants to wait.  She is in [employer].    01/20/17 update:  [name] seen today in follow-up, accompanied by her husband who supplements Morgan history.  Morgan [name] is on carbidopa/levodopa 25/100, one tablet but she increased that to 4 times per day in addition to pramipexole ER, 0.75 mg daily.  She has had no sleep attacks.  No hallucinations.  No compulsive behaviors.  One fall when she was squatting in Morgan closet.  Didn't get hurt.  She is on clonazepam for REM behavior disorder, 1 mg at night.  She continues to use gabapentin, 400 mg 3 times a day for peripheral neuropathy as well as her fibromyalgia. C/o pain in Morgan legs.   She is on Cymbalta for her fibromyalgia as well.  She is exercising with rock steady boxing 1-2 times per week.  05/23/17 update:  Pt seen in f/u for PD.  This [name] is accompanied in Morgan office by her spouse who supplements Morgan history.  Pt on carbidopa/levodopa 25/100, 1/2/1 and pramipexole ER, 0.75 mg daily.  Pt had some trouble walking on Morgan cruise but some of that was "sea legs."  Pt denies lightheadedness, near syncope.  No hallucinations.  Mood has been good.  On cymbalta both for mood and for fibromyalgia.  Tried dry needling but it wasn't helpful.   Asks for lidoderm patch for legs.  Has used in past.  Able to pay for that self pay if I write.   Continues to travel extensively with her husband.  Is no longer going to [employer] because she had shoulder injuries.  She is in PWR classes and she is going to [facility] for gentle yoga.    [facility]  for therapies and I reviewed those visit notes.   States that she is still going.  Is having R shoulder/arm pain and in therapy for that.    She has a group of PD patients that they call "movers and shakers" that she hangs out with and goes to lunch with and is planning a christmas party.    09/21/17 update:  Pt seen in f/u.  This [name] is accompanied in Morgan office by her spouse who supplements Morgan history.  On carbidopa/levodopa 25/100, 1/2/1 and pramipexole ER, 0.75 mg daily.  She states that carbidopa/levodopa 25/100 helps Morgan pain in her legs and she wonders if she should increase it.  No compulsive behaviors.  No sleep attacks.  Pt denies falls.  Pt denies lightheadedness, near syncope.  No hallucinations.  Mood has been good.  On cymbalta for mood and fibromyalgia and works fairly well.  Also on gabapentin 400 mg tid for nerve pain.  She wonders if that works, thinks that levodopa helps with this (why she wants to increase it) and wants to d/c it.  She is doing silver sneakers classes at [facility] 3 days per week and doing PWR classes one day per week and yoga one day per week.    01/25/18 update: [name] is seen today in follow-up.  She is accompanied by her husband who supplements Morgan history.  She is on carbidopa/levodopa 25/100, 2/2/1 and pramipexole ER 0.75 mg daily.  She has no compulsive behaviors or sleep attacks.  No hallucinations.  No lightheadedness or near syncope.  No falls but did recently trip over her dog and hit her hand on Morgan door.  She didn't actually fall to Morgan ground.  Records are reviewed since our last visit.  She is scheduled for a D&C and hysteroscopy on June 19.  She quit exercising when she had vaginal bleeding and before she found out it was just polyps.   She did much better when she was exercising in terms of physical ability.  She was previously exercising 3 days per week.  She isn't sure why she hasn't got back to it, as no restrictions from gyn.  She is also on gabapentin.  She thinks that Morgan pain in her thighs is somewhat worse.  Does not know if that is related to Morgan fact she is not exercising as much.  She wonders if Morgan gabapentin is not working as well.  05/29/18 update:  Pt seen in follow-up for Parkinson's disease.  She is accompanied by her husband who supplements Morgan history.  Remains on carprofen/levodopa 25/100, 2 tablets in Morgan morning, 2 in Morgan afternoon and 1 in Morgan evening.  She is also on pramipexole ER 0.75 mg daily.  No sleep attacks.  No lightheadedness or near syncope.  No hallucinations.  Has had numerous falls but reports that they took in mother in laws dog and it keeps tripping her.  talked about Morgan importance last visit of getting back to regular exercise and she is now using Morgan U step faithfully 2-3 days/week.  She was sleeping well with her clonazepam but she ran out of that and tried xanax and asks if we could change to that.  She had her therapy screens in September and I have reviewed those.  Physical and Occupational Therapy were recommended.  She is not smoking cigarettes, but is consistently chewing nicotine-containing gum.  11/06/18 update: [name] seen today in follow-up for Parkinson's disease, accompanied by her husband who supplements history.  She  is on carbidopa/levodopa 25/100, 2 in Morgan morning, 2 in Morgan afternoon and 1 in Morgan evening.  This is in addition to pramipexole ER, 0.75 mg daily.  She has had no sleep attacks.  No lightheadedness or near syncope.  No hallucinations.  She remains on clonazepam at bedtime.  She is going to [employer] 2 days per week and has a Physiological scientist.  She goes to [facility] 1-2 days per week with Morgan recumbant bike.    She has had 2 falls.  With one she was chasing Morgan dog and she fell.   With Morgan other, she was trying to pick up ice cubes from Morgan floor and was bending over and trying to reach to pick up Morgan cubes and fell over.    PREVIOUS MEDICATIONS: Mirapex and azilect; levodopa for a few weeks to "prove" benefit; requip  ALLERGIES:   Allergies  Allergen Reactions  . Other Anaphylaxis    "Anti Seizure Medications"  . Phenergan [Promethazine Hcl] Other (See Comments)    Makes Parkinson worse  . Sporanox [Itraconazole] Hives  . Tylenol [Acetaminophen] Other (See Comments)    "I just get weird"    CURRENT MEDICATIONS:  Current Outpatient Medications on File Prior to Visit  Medication Sig Dispense Refill  . ALPRAZolam (XANAX) 0.25 MG tablet Take 0.25 mg by mouth daily as needed for anxiety.     Marland Kitchen aspirin 325 MG tablet Take 325-650 mg by mouth daily as needed (for headaches/pain.).     Marland Kitchen atorvastatin (LIPITOR) 10 MG tablet Take 10 mg by mouth at bedtime.    Marland Kitchen CALCIUM CITRATE PO Take 2 tablets by mouth daily. BariMelts Calcium Citrate    . carbidopa-levodopa (SINEMET IR) 25-100 MG tablet TAKE 2 TABLETS IN Morgan MORNING, 2 TABLETS IN Morgan  AFTERNOON, AND 1 TABLET IN Morgan EVENING 450 tablet 1  . Cholecalciferol (VITAMIN D-3) 5000 units TABS Take 5,000 Units by mouth 2 (two) times daily.    . clonazePAM (KLONOPIN) 1 MG tablet TAKE 1 TABLET BY MOUTH EVERYDAY AT BEDTIME 90 tablet 1  . Coenzyme Q10-Vitamin E (QUNOL ULTRA COQ10 PO) Take 1 capsule by mouth daily.    . CYMBALTA 60 MG capsule Take 1 capsule (60 mg total) by mouth daily. 90 capsule 1  . gabapentin (NEURONTIN) 400 MG capsule TAKE 1 CAPSULE 3 TIMES A   DAY 270 capsule 3  . Magnesium 400 MG TABS Take 400 mg by mouth at bedtime.    . Multiple Vitamins-Minerals (PRESERVISION AREDS 2 PO) Take 1 tablet by mouth 2 (two) times daily.    . Polyethylene Glycol 400 (BLINK TEARS) 0.25 % SOLN Place 1 drop into both eyes 3 (three) times daily as needed (for dry/irritated eyes.).    Marland Kitchen Pramipexole Dihydrochloride 0.75 MG TB24 TAKE 1  TABLET DAILY 90 tablet 1  . raloxifene (EVISTA) 60 MG tablet Take 60 mg by mouth daily.     No current facility-administered medications on file prior to visit.     PAST MEDICAL HISTORY:   Past Medical History:  Diagnosis Date  . Anxiety   . Fibromyalgia   . Hypercholesteremia    controlled on medication  . Hyperlipidemia   . Hypertension    no current meds  . LBBB (left bundle branch block)   . Melanoma ([place])   . MVP (mitral valve prolapse)   . Osteopenia   . Parkinson disease ([place])   . SVD (spontaneous vaginal delivery)    x 2  PAST SURGICAL HISTORY:   Past Surgical History:  Procedure Laterality Date  . BLEPHAROPLASTY    . cold knife conization    . COLONOSCOPY     FH colon CA  . DILATATION & CURETTAGE/HYSTEROSCOPY WITH MYOSURE N/A 02/14/2018   Procedure: ATTEMPTED DILATATION & CURETTAGE/HYSTEROSCOPY WITH MYOSURE;  Surgeon: Christophe Louis, MD;  Location: [facility];  Service: Gynecology;  Laterality: N/A;  polyp  . melanoma cancer     removed from left leg  . TONSILLECTOMY    . TUBAL LIGATION    . WISDOM TOOTH EXTRACTION      SOCIAL HISTORY:   Social History   Socioeconomic History  . Marital status: Married    Spouse name: Not on file  . Number of children: Not on file  . Years of education: Not on file  . Highest education level: Not on file  Occupational History  . 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 Financial resource strain: Not on file  . Food insecurity:    Worry: Not on file    Inability: Not on file  . Transportation needs:    Medical: Not on file    Non-medical: Not on file  Tobacco Use  . Smoking status: Former Smoker    Packs/day: 0.25    Types: Cigarettes    Last attempt to quit: 09/29/1997    Years since quitting: 21.1  . Smokeless tobacco: Never Used  . Tobacco comment: chews [place], 10-12/day  Substance and Sexual Activity  . Alcohol use: Yes    Comment: Occasional wine  . Drug use: No  .  Sexual activity: Not on file  Lifestyle  . Physical activity:    Days per week: Not on file    Minutes per session: Not on file  . Stress: Not on file  Relationships  . Social connections:    Talks on phone: Not on file    Gets together: Not on file    Attends religious service: Not on file    Active member of club or organization: Not on file    Attends meetings of clubs or organizations: Not on file    Relationship status: Not on file  . Intimate partner violence:    Fear of current or ex partner: Not on file    Emotionally abused: Not on file    Physically abused: Not on file    Forced sexual activity: Not on file  Other Topics Concern  . Not on file  Social History Narrative  . Not on file    FAMILY HISTORY:   Family Status  Relation Name Status  . Mother  Deceased       colon CA  . Father  Alive       healthy  . Brother  Alive       healthy  . Sister  Alive       4, healthy  . Child  Alive       healthy  . MGM  (Not Specified)    ROS:  Review of Systems  Constitutional: Negative.   HENT: Negative.   Eyes: Negative.   Respiratory: Negative.   Cardiovascular: Negative.   Gastrointestinal: Negative.   Skin: Negative.      PHYSICAL EXAMINATION:    VITALS:   Vitals:   11/06/18 1531  BP: 138/86  Pulse: 98  SpO2: 95%  Weight: 175 lb (79.4 kg)  Height: 5\' 5"  (1.651 m)    GEN:  Morgan [name] appears stated age and is in NAD. HEENT:  Normocephalic, atraumatic.  Morgan mucous membranes are moist. Morgan superficial temporal arteries are without ropiness or tenderness. CV:  RRR Lungs:  CTAB Neck/HEME:  There are no carotid bruits bilaterally.  Neurological examination:  Orientation: Morgan [name] is alert and oriented x3. Cranial nerves: There is good facial symmetry. Morgan speech is fluent and clear. Soft palate rises symmetrically and there is no tongue deviation. Hearing is intact to conversational tone. Sensation: Sensation is intact to light touch  throughout Motor: Strength is 5/5 in Morgan bilateral upper and lower extremities.   Shoulder shrug is equal and symmetric.  There is no pronator drift.  Movement examination: Tone: There is normal tone in Morgan upper and lower extremities. Abnormal movements: There is mild to moderate dyskinesia in Morgan right greater than left hemibody. Coordination:  There is no decremation with RAM's, with hand opening and closing, finger taps, alteration of supination/pronation of Morgan forearm or with turning in a light bulb bilaterally. Gait and Station: Morgan [name] has no difficulty arising out of a deep-seated chair without Morgan use of Morgan hands.  Her legs are bent at Morgan knees when she ambulates.  She does not shuffle, but she is slightly ataxic.   LABS:  Lab Results  Component Value Date   TSH 0.80 01/15/2013   Lab Results  Component Value Date   QIONGEXB28 413 01/15/2013    SPEP/UPEP negative.    Lab Results  Component Value Date   FOLATE 22.1 01/15/2013     ASSESSMENT/PLAN:  1.  Idiopathic akinetic rigid Parkinson's disease.    -We discussed Morgan diagnosis as well as pathophysiology of Morgan disease.  We discussed treatment options as well as prognostic indicators.  [name] education was provided.  -Continue on carbidopa/levodopa 25/100, 2/2/1.  May take an extra if needed (primarily wants Morgan option to take an extra at because of worry about not having enough medication for extra if there is a shortage given coronavirus outbreak)  -  She is on mirapex er 0.75 mg daily.  She will remain on this medication.  We discussed risk, benefits, and side effects.  -discussed dbs in detail.  She is not interested in DBS at this point in time.    -Congratulated her on Morgan exercise that she is doing. 2.  Fibromyalgia  -on cymbalta.  She would like to continue this.  -dont think that myalgias are from Morgan addition of levodopa last September.  Stopping lipitor didn't help.  -continue gabapentin 400 mg 3  times per day.   -dry needling wasn't helpful 3.  probable peripheral neuropathy, idiopathic.  -Back on Cymbalta both for peripheral neuropathy as well as fibromyalgia. 4.  history of melanoma.  -There is some old literature that suggests that Morgan addition of levodopa can increase risk of return melanoma, but new literature suggests that this is not Morgan etiology.   -She is to continue with yearly skin evaluations. 5.  history of tobacco abuse.  -Talked again today about Morgan importance of discontinuing Morgan nicotine gum. 6.  Insomnia with some RBD  - Will remain on clonazepam, 1 mg nightly.  7. Follow up is anticipated in Morgan next few months, sooner should new neurologic issues arise.  Much greater than 50% of this visit was spent in counseling and coordinating care.  Total face to face time:  30 min

## 2018-11-06 ENCOUNTER — Ambulatory Visit (INDEPENDENT_AMBULATORY_CARE_PROVIDER_SITE_OTHER): Payer: Medicare Other | Admitting: Neurology

## 2018-11-06 ENCOUNTER — Encounter: Payer: Self-pay | Admitting: Neurology

## 2018-11-06 VITALS — BP 138/86 | HR 98 | Ht 65.0 in | Wt 175.0 lb

## 2018-11-06 DIAGNOSIS — G4752 REM sleep behavior disorder: Secondary | ICD-10-CM | POA: Diagnosis not present

## 2018-11-06 DIAGNOSIS — G249 Dystonia, unspecified: Secondary | ICD-10-CM | POA: Diagnosis not present

## 2018-11-06 DIAGNOSIS — G20 Parkinson's disease: Secondary | ICD-10-CM | POA: Diagnosis not present

## 2018-11-06 MED ORDER — CARBIDOPA-LEVODOPA 25-100 MG PO TABS: 2.0000 | ORAL_TABLET | Freq: Three times a day (TID) | ORAL | 1 refills | Status: DC

## 2018-11-06 NOTE — Patient Instructions (Signed)
The physicians and staff at [facility] are committed to providing excellent care. You may receive a survey requesting feedback about your experience at our office. We strive to receive "very good" responses to the survey questions. If you feel that your experience would prevent you from giving the office a "very good " response, please contact our office to try to remedy the situation. We may be reached at [phone]. Thank you for taking the time out of your busy day to complete the survey.  

## 2018-11-07 DIAGNOSIS — H353122 Nonexudative age-related macular degeneration, left eye, intermediate dry stage: Secondary | ICD-10-CM | POA: Diagnosis not present

## 2018-11-07 DIAGNOSIS — H353212 Exudative age-related macular degeneration, right eye, with inactive choroidal neovascularization: Secondary | ICD-10-CM | POA: Diagnosis not present

## 2018-11-07 DIAGNOSIS — H43813 Vitreous degeneration, bilateral: Secondary | ICD-10-CM | POA: Diagnosis not present

## 2018-11-10 DIAGNOSIS — Z23 Encounter for immunization: Secondary | ICD-10-CM | POA: Diagnosis not present

## 2019-01-07 ENCOUNTER — Telehealth: Payer: Self-pay | Admitting: *Deleted

## 2019-01-07 NOTE — Telephone Encounter (Signed)
A message was left,to give our office a call.

## 2019-01-09 DIAGNOSIS — H353212 Exudative age-related macular degeneration, right eye, with inactive choroidal neovascularization: Secondary | ICD-10-CM | POA: Diagnosis not present

## 2019-01-27 ENCOUNTER — Other Ambulatory Visit: Payer: Self-pay | Admitting: Neurology

## 2019-01-28 NOTE — Telephone Encounter (Signed)
Requested Prescriptions   Pending Prescriptions Disp Refills  . CYMBALTA 60 MG capsule [Pharmacy Med Name: CYMBALTA CAP 60MG ] 90 capsule 1    Sig: TAKE 1 CAPSULE DAILY  . Pramipexole Dihydrochloride 0.75 MG TB24 [Pharmacy Med Name: PRAMIPEXOLE  TAB 0.75MG ER] 90 tablet 1    Sig: TAKE 1 TABLET DAILY   Rx last filled: 05/29/18 #90 1 refills 08/31/18 #90 1 refillls   Pt last seen: 11/06/18  -Back on Cymbalta both for peripheral neuropathy as well as fibromyalgia.    She is on mirapex er 0.75 mg daily.  She will remain on this medication.  We discussed risk, benefits, and side effects  Follow up:  04/16/19

## 2019-03-04 ENCOUNTER — Other Ambulatory Visit: Payer: Self-pay | Admitting: Neurology

## 2019-03-12 ENCOUNTER — Other Ambulatory Visit: Payer: Self-pay | Admitting: Neurology

## 2019-03-13 DIAGNOSIS — H43813 Vitreous degeneration, bilateral: Secondary | ICD-10-CM | POA: Diagnosis not present

## 2019-03-13 DIAGNOSIS — H35372 Puckering of macula, left eye: Secondary | ICD-10-CM | POA: Diagnosis not present

## 2019-03-13 DIAGNOSIS — H353212 Exudative age-related macular degeneration, right eye, with inactive choroidal neovascularization: Secondary | ICD-10-CM | POA: Diagnosis not present

## 2019-03-13 DIAGNOSIS — H353122 Nonexudative age-related macular degeneration, left eye, intermediate dry stage: Secondary | ICD-10-CM | POA: Diagnosis not present

## 2019-03-13 DIAGNOSIS — H2513 Age-related nuclear cataract, bilateral: Secondary | ICD-10-CM | POA: Diagnosis not present

## 2019-03-15 ENCOUNTER — Other Ambulatory Visit: Payer: Self-pay | Admitting: Neurology

## 2019-03-17 ENCOUNTER — Other Ambulatory Visit: Payer: Self-pay | Admitting: Neurology

## 2019-03-18 ENCOUNTER — Telehealth: Payer: Self-pay | Admitting: Neurology

## 2019-03-18 ENCOUNTER — Other Ambulatory Visit: Payer: Self-pay | Admitting: Neurology

## 2019-03-18 MED ORDER — CLONAZEPAM 1 MG PO TABS: ORAL_TABLET | ORAL | 0 refills | Status: DC

## 2019-03-18 NOTE — Telephone Encounter (Signed)
error 

## 2019-03-18 NOTE — Telephone Encounter (Signed)
Provider approved 

## 2019-03-18 NOTE — Telephone Encounter (Signed)
Requested Prescriptions   Pending Prescriptions Disp Refills  . carbidopa-levodopa (SINEMET IR) 25-100 MG tablet [Pharmacy Med Name: CARB/LEVO TAB 25-100MG ] 540 tablet 1    Sig: TAKE 2 TABLETS 3 TIMES A   DAY AS DIRECTED   Rx last filled:11/06/18 #540 1 refills  Pt last seen:11/06/18  Follow up appt scheduled:04/16/19

## 2019-03-18 NOTE — Telephone Encounter (Signed)
Requested Prescriptions   Pending Prescriptions Disp Refills  . clonazePAM (KLONOPIN) 1 MG tablet 90 tablet 0    Sig: TAKE 1 TABLET BY MOUTH EVERYDAY AT BEDTIME   Rx last filled:03/12/19 #90 0 refills  Pt last seen:11/06/18  Follow up appt scheduled:04/16/19  Rx was printed  No evidence that Rx was hard fax or phone in.  Will ask provider to approve

## 2019-03-18 NOTE — Telephone Encounter (Signed)
New Message   *STAT* If patient is at the pharmacy, call can be transferred to refill team.   1. Which medications need to be refilled? (please list name of each medication and dose if known)  ClonazePam (Klonopin) 1 mg tablet once at bedtime  2. Which pharmacy/location (including street and city if local pharmacy) is medication to be sent to? [facility] [address]  3. Do they need a 30 day or 90 day supply?  30 day supply  Patient was unable to pick up Rx, pharmacy denied

## 2019-03-20 ENCOUNTER — Other Ambulatory Visit: Payer: Self-pay | Admitting: Neurology

## 2019-03-20 ENCOUNTER — Telehealth: Payer: Self-pay | Admitting: Neurology

## 2019-03-20 MED ORDER — CLONAZEPAM 1 MG PO TABS: ORAL_TABLET | ORAL | 0 refills | Status: DC

## 2019-03-20 NOTE — Telephone Encounter (Signed)
Requested Prescriptions   Pending Prescriptions Disp Refills  . clonazePAM (KLONOPIN) 1 MG tablet [Pharmacy Med Name: CLONAZEPAM 1 MG TABLET] 90 tablet     Sig: TAKE 1 TABLET BY MOUTH EVERYDAY AT BEDTIME   Rx last filled:03/18/19 #90 0 refills  Pt last seen: 11/06/18   Follow up appt scheduled:04/16/19  No additional refills on Rx

## 2019-03-20 NOTE — Telephone Encounter (Signed)
Rx sent 

## 2019-03-20 NOTE — Telephone Encounter (Signed)
Provider approved sent to pharmcay

## 2019-03-20 NOTE — Telephone Encounter (Signed)
Medication: [facility]epam to [facility] [address]. Please send this refill in. Thanks!

## 2019-03-20 NOTE — Telephone Encounter (Signed)
Rx was sent on 7/20 with #90.  Please verify with pharmacy whether it was rec'd.  Thanks. New Rx will be declined.

## 2019-03-20 NOTE — Telephone Encounter (Signed)
Rx was printed on 03/18/19 no confirmation that Rx was hard fax or phone in to pharmacy. Called pharmacy spoke with Clair Gulling last clonazepam Rx was filled in April 2020. They never received the refill approval from 03/18/19

## 2019-03-20 NOTE — Addendum Note (Signed)
Addended by: Alda Berthold on: 03/20/2019 11:46 AM   Modules accepted: Orders

## 2019-03-20 NOTE — Telephone Encounter (Signed)
Duplicate see refill request Pending provider approval

## 2019-04-15 DIAGNOSIS — G20 Parkinson's disease: Secondary | ICD-10-CM | POA: Diagnosis not present

## 2019-04-15 DIAGNOSIS — F419 Anxiety disorder, unspecified: Secondary | ICD-10-CM | POA: Diagnosis not present

## 2019-04-15 DIAGNOSIS — E559 Vitamin D deficiency, unspecified: Secondary | ICD-10-CM | POA: Diagnosis not present

## 2019-04-15 DIAGNOSIS — E785 Hyperlipidemia, unspecified: Secondary | ICD-10-CM | POA: Diagnosis not present

## 2019-04-15 DIAGNOSIS — M797 Fibromyalgia: Secondary | ICD-10-CM | POA: Diagnosis not present

## 2019-04-15 DIAGNOSIS — M8580 Other specified disorders of bone density and structure, unspecified site: Secondary | ICD-10-CM | POA: Diagnosis not present

## 2019-04-15 NOTE — Progress Notes (Signed)
Morgan [name] was seen today in the movement disorders clinic for neurologic consultation at the request of Dr. [name] at [facility] internal medicine.  She is accompanied by her husband who helps to supplement the history.  She has previously seen neurology, both Dr. Krista Blue and Dr. Doy Mince.  I reviewed some of these notes.  The consultation is for the evaluation of Parkinson's disease.  The patient is a 68 y.o. right handed female reports that her first sx was "active dreaming."  However, the symptom that got her to the dr was L greater than R hand tremor, only when moving hands/drying hair.  This was in 2010.  She was started on carbidopa/levodopa just as a "trial" for 2 weeks and then it was stopped because it worked.  She was told that it was diagnostic of PD.  She was then started on the Mirapex ER 1.5 mg in 07/2009. and it was worked up to 3.0 mg.  She reports compulsive shopping (she overspends her "allowance).  She mostly internets shops.  She has always shopped but she has previously not gone over the "allowance."  She plays the lottery and plays more than previous.  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  She tried to get off the medication but it was unsuccessful because of worsening of PD.  She was placed on Azilect.  She has been on it for about 2 years.  She has not noted any benefits.    It was her impression that it made the mirapex last longer.  She feels strongly that the computer work and shopping became an issue because of the azilect.   04/19/13 update:  The patient presents today with her husband who supplements the history.  Once she discontinued the Azilect, she felt that her compulsive shopping got better.  Her husband feels that she is not exercising enough, but the patient estimates that she perhaps exercises 4 days per week, but not to the intensity that her husband would like.  She had one near fall since our last visit, but she was on a trail in [place] with 2 walking sticks and one  of them something to the ground and her husband caught her.  She is still taking clonazepam at night.  Her husband thinks that she takes it too early and falls asleep on the couch, but she denies this.  There have not been hallucinations.  There has not been nausea or vomiting.  She has been slower.   08/13/13 update:  The pt is f/u for PD.  She is accompanied by her husband who supplements the hx.  She is on mirapex 3 mg daily and carbidopa/levodopa 25/100 was added last visit.  She notices that she has more energy and can do more things, although her husband states that she does not necessarily do those things (including exercise). She noted that the feet "wobble" since being on the medication.  She also notices that her shopping "addiction" has returned.  She has had no falls.  No hallucinations.  No near syncope.  09/24/13 update:  The pt is f/u regarding PD.  She is accompanied by her husband who helps to supplement the history.  Last visit, I decreased her pramipexole to 2.25 mg daily secondary to compulsive shopping.  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  They both  agree that this could be pathologic.  She is now on carbidopa/levodopa 25/100, half a tablet 4 times per day.  Dyskinesia in the foot is better.  She is just getting over the flu.  No hallucinations.  She has not started exercising faithfully, primarily because she has not been feeling well.  No falls.    12/24/13 update:  The patient is following up in regards to her Parkinson's disease.  She is accompanied by her husband who helps to supplement the history.  Her Mirapex has been reduced to 1.5 mg daily secondary to compulsive behaviors.  She did call and ask if she could increase it, but I did not want to do that.  She is still having compulsive behaviors (shopping) and her husband  would like her to discontinue the medication altogether.  She is currently on carbidopa/levodopa 25/100, half a tablet 4 times per day.  She takes at approximately 8 AM, noon, 4 PM and 9 PM.  Unfortunately, the patient had dyskinesia at higher dosages.  Nonetheless, she states that she would rather have the dyskinesia than feel like she currently does.  She feels slow and achy and just does not feel like doing anything.  She was not having any hallucinations.  She does state that she signed up for a 5K in January and would like to start training for that.  She is not having any hallucinations.  No falls.  She is still on Cymbalta, 60 mg daily as well as clonazepam for insomnia.  01/16/14 update:  The patient is accompanied by her husband who helps to supplement the history.  Last visit, we started the patient on Rytary, and she is currently on 145 mg 3 times a day.  Unfortunately, the patient reports that she had insomnia on the medication and felt "woozy" and really overall felt better on the carbidopa/levodopa IR and would like to go back to that medication, even though she dyskinesia on low dose.  She asks me if she can wean the Mirapex and restart the levodopa.  [facility].  She is planning on working up to running a 5K at [employer] in January, 2016.  She remains on the Cymbalta for her fibromyalgia.  03/20/14 update:  The patient returns today as a work in, accompanied by her husband who supplements the history.  Much has happened since our last visit.  Last visit, I tried to decrease the patient's Mirapex ER to 0.75 mg daily, primarily because of compulsive behaviors.  I started her on levodopa.  She called me back and wanted to go up on the Mirapex because of cramping in the thigh, but instead I increased her levodopa to 4 times a day.  The cramping did get better, but she called later with dyskinesia.  At that time my partner was on  call and she reluctantly increased the patient's Mirapex back to 1.5 mg daily and decreased patient's levodopa to twice a day dosing.  The patient later called me with continued dyskinesias and wanted to go further up on the Mirapex, but I have been resistant because of her compulsive spending in the past.  Instead, on 03/07/2014 I asked her to try amantadine and called in a prescription for this.  Unfortunately, she stated that she read the side effects and decided that she was not going to pick this up and take it.  She is now only on Mirapex 1.5 mg  daily and levodopa 25/100, one tablet in the morning and half tablet at night.  She would like to go back up on the Mirapex and her husband states that if he has to disable the computer to deal with her compulsive shopping then he will do that.  04/21/14 update:  Pt returns today for follow up, accompanied by her husband who supplements the history.  She is back on mirapex, 1.5 mg daily.  She did not do well on the requip and was pretty insistent on going back on the mirapex despite previous convulsive behaviors.  She wants to go off levodopa 25/100, and she is on it 1/2 bid but often forgets the second dose.  She is able to drive again.   She states that she thinks that she needs to go back up to the 3.0 of the mirapex.  Her husband is not now completely convinced that the mirapex caused compulsive shopping but thinks that boredom did that.  They are getting ready to go on a 14 day [employer] cruise at the end of October.   07/29/14 update:  Pt returns for f/u, accompanied by her husband who supplements the history.  She is now up to 3.0 mg of mirapex ER a day.   She states that she doesn't feel as good as she did when she was on it previously, but she is doing better than she was on the previous medications.  She has some trouble getting off of the sofa.  No falls.  Is doing circuit II class for PD as well as [employer] but this only takes up 2 days a  week and admits isn't doing CV work the remainder of the days.  Husband asks about finding a counselor for her anxiety, which is primarily associated with driving.  Her compulsive shopping has not gotten worse with higher dose of mirapex (feels that it is better as they just got off a cruise and she was previously shopping for the cruise)  11/04/14 update:  Pt returns for f/u, accompanied by her husband who supplements the history.  She did the [employer] since our last visit.  She does state that she was the last one in but she was able to finish.  I had got a call from the rehab unit that the patient had wanted to hold therapy because of fibromyalgia but the patient states that she wanted to continue therapy and perhaps she cancelled one day and not the entire therapy.  She asks me to renew the order.  Just a few days ago (2 nights) the patient backed down on the mirapex from 3.75 mg to 3.0 mg due to EDS and she wasn't sure if it was due to  the mirapex so she wanted to try the lower dosage.  She would like to get a scooter and asks me about that today  02/05/15 update:  The patient is accompanied by her husband who supplements the history.  She is on Mirapex ER 3.0 mg daily.  She thinks that she wants to try again potentially going up on it.  She feels a little slower than usual.  She is doing PWR classes.     She denies any falls.  No hallucinations.  No lightheadedness or near syncope.  No dyskinesia.  She is supposed to be on Cymbalta for both peripheral neuropathy and fibromyalgia.  She ran out and is now not doing well.  She has been out of it for 2 weeks.  The patient thinks that her fibromyalgia got out of control, she quit exercising so vigorously and then her Parkinson's got worse.  She takes clonazepam as needed for insomnia (3-4 days per week).  They're planning several cruises and several vacations in the near future.  05/26/15 update:  The patient is following up today, accompanied by her husband who  supplements the history.  Last visit, I increased her Mirapex to 3.75 mg daily and restarted her Cymbalta for examination of fibromyalgia and peripheral neuropathy.  She thinks that the cymbalta helped but she thinks the increased mirapex caused more tremor.  She also thinks it causes EDS.  She is falling asleep now in the afternoon and having to take the klonopin late at night to help her sleep (1 mg).  She states that doesn't give her hang over effect.  She wants to go back down again to 3.0 mg of the mirapex.    09/03/15 update:  The patient is following up today, accompanied by her husband who supplements the history.  Last visit, the patient wanted to decrease her pramipexole to 3.0 mg daily.  Last visit, we restarted her Sinemet 25/100, half a tablet 3 times per day.  She states that she ended up d/c her pramipexole all together and increased her Sinemet to 1 po qid.  She feels well as long as she doesn't overdo it.  She remains on Cymbalta, 60 mg daily for peripheral neuropathy and fibromyalgia.  She is also on clonazepam 1 mg nightly for REM behavior disorder and insomnia.  She has been faithfully attending Parkinson's therapies at the neuro-rehabilitation center.  She has not had any falls since our last visit.  No hallucinations.  No lightheadedness or near syncope.  12/17/15 update:  The patient returns today, accompanied by her husband who supplements the history.  Last visit, she was on carbidopa/levodopa 25/100, 1 tablet 4 times per day and had taken herself off of pramipexole.  She has put herself back on pramipexole ER 0.75 mg daily and is on carbidopa/levodopa 25/100 3 times per day.  She denies hallucinations.  She had no falls but did trip over a stool in her closet but didn't fall.   She is doing [employer] class one day a week.  She is riding the recumbant bike.  She is scheduled for her assessment next week for PT/OT.    03/10/16 update:  The patient returns today, accompanied by her  husband who supplements the history.  I have reviewed multiple records since our last visit.  She has seen cardiology and was diagnosed with a left bundle branch block and PVCs which were increased by hypokalemia which was from hydrochlorothiazide.  It was recommended that she decrease the dose of the the HCTZ.  She had an echocardiogram on 01/11/2016 was unremarkable with a normal ejection fraction of 50-55%.  She is on pramipexole ER 0.75 mg daily and carbidopa/levodopa 25/100, one tablet 3 times per day.  She is on clonazepam for REM behavior disorder, 1 mg at night.  She has not had falls since our last visit.  She is having "aching" of the legs.  She was taken off of the lipitor by the PCP but it didn't help.  She originally thought that it was her fibromyalgia but she now wonders if it was the addition of levodopa.  No hallucinations.  No lightheadedness or near syncope.  Continues to chew nicotine gum but states that she is trying to cut down on it.  07/07/16 update:  Patient follows up today, accompanied by her husband who supplements the history.  She is on carbidopa/levodopa 25/100, one tablet 3 times per day (7am/2pm/8pm) and pramipexole ER, 0.75 mg daily.  She is also on clonazepam for REM behavior disorder, 1 mg at night.  She had one fall since last visit; she tripped over the dog.  She denies any hallucinations.  She denies lightheadedness or near syncope.  She remains on Cymbalta for her fibromyalgia, but gabapentin was started last visit and she has worked up to 100 mg, 2 po tid.  The gabapentin helped "a little" but not a lot.  The in home PT helped some.  Her and her husband continue to travel significantly via their RV.  10/07/16 update:  Patient follows up today, accompanied by her husband who supplements the history.  Patient remains on carbidopa/levodopa 25/100, one tablet 3 times per day.  She is still on pramipexole ER, 0.75 mg daily.  One fall.  Was practicing a stretch off a chair and she  rolled off of the chair.   Doing well with clonazepam for REM behavior disorder, 1 mg at night.  Last visit I increased her gabapentin to 300 mg 3 times a day in addition to her Cymbalta.  She is on these both for peripheral neuropathy as well as fibromyalgia.  She reports that she went up to 400 mg 3 times per day on the gabapentin and it helped.  She denies hallucinations.  She denies lightheadedness or near syncope.  After seeing speech therapy, a modified barium swallow was recommended, but when I tried to order this, the patient asked me to hold on ordering it.  She still does not think that this is a particular issue and wants to wait.  She is in [employer].    01/20/17 update:  Patient seen today in follow-up, accompanied by her husband who supplements the history.  The patient is on carbidopa/levodopa 25/100, one tablet but she increased that to 4 times per day in addition to pramipexole ER, 0.75 mg daily.  She has had no sleep attacks.  No hallucinations.  No compulsive behaviors.  One fall when she was squatting in the closet.  Didn't get hurt.  She is on clonazepam for REM behavior disorder, 1 mg at night.  She continues to use gabapentin, 400 mg 3 times a day for peripheral neuropathy as well as her fibromyalgia. C/o pain in the legs.   She is on Cymbalta for her fibromyalgia as well.  She is exercising with rock steady boxing 1-2 times per week.  05/23/17 update:  Pt seen in f/u for PD.  This patient is accompanied in the office by her spouse who supplements the history.  Pt on carbidopa/levodopa 25/100, 1/2/1 and pramipexole ER, 0.75 mg daily.  Pt had some trouble walking on the cruise but some of that was "sea legs."  Pt denies lightheadedness, near syncope.  No hallucinations.  Mood has been good.  On cymbalta both for mood and for fibromyalgia.  Tried dry needling but it wasn't helpful.   Asks for lidoderm patch for legs.  Has used in past.  Able to pay for that self pay if I write.   Continues to travel extensively with her husband.  Is no longer going to [place] because she had shoulder injuries.  She is in [employer] classes and she is going to the [facility] for gentle yoga.    Been to the neurorehab center in august  for therapies and I reviewed those visit notes.   States that she is still going.  Is having R shoulder/arm pain and in therapy for that.    She has a group of PD patients that they call "movers and shakers" that she hangs out with and goes to lunch with and is planning a christmas party.    09/21/17 update:  Pt seen in f/u.  This patient is accompanied in the office by her spouse who supplements the history.  On carbidopa/levodopa 25/100, 1/2/1 and pramipexole ER, 0.75 mg daily.  She states that carbidopa/levodopa 25/100 helps the pain in her legs and she wonders if she should increase it.  No compulsive behaviors.  No sleep attacks.  Pt denies falls.  Pt denies lightheadedness, near syncope.  No hallucinations.  Mood has been good.  On cymbalta for mood and fibromyalgia and works fairly well.  Also on gabapentin 400 mg tid for nerve pain.  She wonders if that works, thinks that levodopa helps with this (why she wants to increase it) and wants to d/c it.  She is doing silver sneakers classes at the [facility] 3 days per week and doing PWR classes one day per week and yoga one day per week.    01/25/18 update: Patient is seen today in follow-up.  She is accompanied by her husband who supplements the history.  She is on carbidopa/levodopa 25/100, 2/2/1 and pramipexole ER 0.75 mg daily.  She has no compulsive behaviors or sleep attacks.  No hallucinations.  No lightheadedness or near syncope.  No falls but did recently trip over her dog and hit her hand on the door.  She didn't actually fall to the ground.  Records are reviewed since our last visit.  She is scheduled for a D&C and hysteroscopy on June 19.  She quit exercising when she had vaginal bleeding and before she found out it was just polyps.   She did much better when she was exercising in terms of physical ability.  She was previously exercising 3 days per week.  She isn't sure why she hasn't got back to it, as no restrictions from gyn.  She is also on gabapentin.  She thinks that the pain in her thighs is somewhat worse.  Does not know if that is related to the fact she is not exercising as much.  She wonders if the gabapentin is not working as well.  05/29/18 update:  Pt seen in follow-up for Parkinson's disease.  She is accompanied by her husband who supplements the history.  Remains on carprofen/levodopa 25/100, 2 tablets in the morning, 2 in the afternoon and 1 in the evening.  She is also on pramipexole ER 0.75 mg daily.  No sleep attacks.  No lightheadedness or near syncope.  No hallucinations.  Has had numerous falls but reports that they took in mother in laws dog and it keeps tripping her.  talked about the importance last visit of getting back to regular exercise and she is now using the U step faithfully 2-3 days/week.  She was sleeping well with her clonazepam but she ran out of that and tried xanax and asks if we could change to that.  She had her therapy screens in September and I have reviewed those.  Physical and Occupational Therapy were recommended.  She is not smoking cigarettes, but is consistently chewing nicotine-containing gum.  11/06/18 update: Patient seen today in follow-up for Parkinson's disease, accompanied by her husband who supplements history.  She  is on carbidopa/levodopa 25/100, 2 in the morning, 2 in the afternoon and 1 in the evening.  This is in addition to pramipexole ER, 0.75 mg daily.  She has had no sleep attacks.  No lightheadedness or near syncope.  No hallucinations.  She remains on clonazepam at bedtime.  She is going to [employer] 2 days per week and has a Physiological scientist.  She goes to [facility] 1-2 days per week with the recumbant bike.    She has had 2 falls.  With one she was chasing the dog and she fell.   With the other, she was trying to pick up ice cubes from the floor and was bending over and trying to reach to pick up the cubes and fell over.    04/16/19 update: Patient is seen today in follow-up for Parkinson's disease.  Patient is on carbidopa/levodopa 25/100, 2 tablets in the morning, 2 in the afternoon and 1 in the evening and pramipexole ER, 0.75 mg daily.  She had a fall last Friday.  She was getting dinner and she was holding her plate and her R foot slipped and she went down and hit head on the chair.  No LOC.  No change in consciousness.  One other fall about 2 months ago.  She states that a bookcase fell on top of her and she fell too.   No lightheadedness or near syncope.  No hallucinations.  She does c/o "my feet moving all the time."  She is on clonazepam, 1 mg at bedtime for insomnia and REM behavior disorder.  This does seem to be working well.  I have reviewed PDMP.  PREVIOUS MEDICATIONS: Mirapex and azilect; levodopa for a few weeks to "prove" benefit; requip; amantadine (given in 2015 but pt never took it)  ALLERGIES:   Allergies  Allergen Reactions  . Other Anaphylaxis    "Anti Seizure Medications"  . Phenergan [Promethazine Hcl] Other (See Comments)    Makes Parkinson worse  . Sporanox [Itraconazole] Hives  . Tylenol [Acetaminophen] Other (See Comments)    "I just get weird"    CURRENT MEDICATIONS:  Current Outpatient Medications on File Prior to Visit  Medication Sig Dispense Refill  . ALPRAZolam (XANAX) 0.25 MG tablet Take 0.25 mg by mouth daily as needed for anxiety.     Marland Kitchen aspirin 325 MG tablet Take 325-650 mg by mouth daily as needed (for headaches/pain.).     Marland Kitchen atorvastatin (LIPITOR) 10 MG tablet Take 10 mg by mouth at bedtime.    Marland Kitchen CALCIUM CITRATE PO Take 2 tablets by mouth daily. BariMelts Calcium Citrate    . carbidopa-levodopa (SINEMET IR) 25-100 MG tablet TAKE 2 TABLETS 3 TIMES A   DAY AS DIRECTED 540 tablet 1  . Cholecalciferol (VITAMIN D-3) 5000 units TABS  Take 5,000 Units by mouth 2 (two) times daily.    . clonazePAM (KLONOPIN) 1 MG tablet TAKE 1 TABLET BY MOUTH EVERYDAY AT BEDTIME 90 tablet 0  . Coenzyme Q10-Vitamin E (QUNOL ULTRA COQ10 PO) Take 1 capsule by mouth daily.    . CYMBALTA 60 MG capsule TAKE 1 CAPSULE DAILY 90 capsule 1  . gabapentin (NEURONTIN) 400 MG capsule TAKE 1 CAPSULE 3 TIMES A   DAY 270 capsule 3  . Magnesium 400 MG TABS Take 400 mg by mouth at bedtime.    . Multiple Vitamins-Minerals (PRESERVISION AREDS 2 PO) Take 1 tablet by mouth 2 (two) times daily.    . Polyethylene Glycol 400 (BLINK TEARS) 0.25 %  SOLN Place 1 drop into both eyes 3 (three) times daily as needed (for dry/irritated eyes.).    Marland Kitchen Pramipexole Dihydrochloride 0.75 MG TB24 TAKE 1 TABLET DAILY 90 tablet 1  . raloxifene (EVISTA) 60 MG tablet Take 60 mg by mouth daily.    . Selenium 100 MCG CAPS Take by mouth.     No current facility-administered medications on file prior to visit.     PAST MEDICAL HISTORY:   Past Medical History:  Diagnosis Date  . Anxiety   . Fibromyalgia   . Hypercholesteremia    controlled on medication  . Hyperlipidemia   . Hypertension    no current meds  . LBBB (left bundle branch block)   . Melanoma ([place])   . MVP (mitral valve prolapse)   . Osteopenia   . Parkinson disease ([place])   . SVD (spontaneous vaginal delivery)    x 2    PAST SURGICAL HISTORY:   Past Surgical History:  Procedure Laterality Date  . BLEPHAROPLASTY    . cold knife conization    . COLONOSCOPY     FH colon CA  . DILATATION & CURETTAGE/HYSTEROSCOPY WITH MYOSURE N/A 02/14/2018   Procedure: ATTEMPTED DILATATION & CURETTAGE/HYSTEROSCOPY WITH MYOSURE;  Surgeon: Christophe Louis, MD;  Location: [facility];  Service: Gynecology;  Laterality: N/A;  polyp  . melanoma cancer     removed from left leg  . TONSILLECTOMY    . TUBAL LIGATION    . WISDOM TOOTH EXTRACTION      SOCIAL HISTORY:   Social History   Socioeconomic History  . Marital status: Married     Spouse name: Not on file  . Number of children: 2  . Years of education: Not on file  . Highest education level: Bachelor's degree (e.g., BA, AB, BS)  Occupational History  . 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 Financial resource strain: Not on file  . Food insecurity    Worry: Not on file    Inability: Not on file  . Transportation needs    Medical: Not on file    Non-medical: Not on file  Tobacco Use  . Smoking status: Former Smoker    Packs/day: 0.25    Types: Cigarettes    Quit date: 09/29/1997    Years since quitting: 21.5  . Smokeless tobacco: Never Used  . Tobacco comment: chews [place], 10-12/day  Substance and Sexual Activity  . Alcohol use: Yes    Comment: Occasional wine  . Drug use: No  . Sexual activity: Not on file  Lifestyle  . Physical activity    Days per week: Not on file    Minutes per session: Not on file  . Stress: Not on file  Relationships  . Social Herbalist on phone: Not on file    Gets together: Not on file    Attends religious service: Not on file    Active member of club or organization: Not on file    Attends meetings of clubs or organizations: Not on file    Relationship status: Not on file  . Intimate partner violence    Fear of current or ex partner: Not on file    Emotionally abused: Not on file    Physically abused: Not on file    Forced sexual activity: Not on file  Other Topics Concern  . Not on file  Social History Narrative  . Not on file  FAMILY HISTORY:   Family Status  Relation Name Status  . Mother  Deceased       colon CA  . Father  Alive       healthy  . Brother  Alive       healthy  . Sister x4 Alive       4, healthy  . Child x2 Alive       healthy    ROS:  Review of Systems  Constitutional: Negative.   HENT: Negative.   Eyes: Negative.   Respiratory: Negative.   Cardiovascular: Negative.   Gastrointestinal: Negative.   Genitourinary: Negative.    Musculoskeletal: Positive for falls.  Skin: Negative.   Endo/Heme/Allergies: Negative.      PHYSICAL EXAMINATION:    VITALS:   Vitals:   04/16/19 1529  BP: (!) 159/94  Pulse: (!) 101  SpO2: 94%  Weight: 172 lb 6.4 oz (78.2 kg)  Height: 5\' 5"  (1.651 m)    GEN:  The patient appears stated age and is in NAD. HEENT:  Normocephalic, atraumatic.  The mucous membranes are moist. The superficial temporal arteries are without ropiness or tenderness. CV:  Tachycardic.  regular Lungs:  CTAB Neck/HEME:  There are no carotid bruits bilaterally.  Neurological examination:  Orientation: The patient is alert and oriented x3. Cranial nerves: There is good facial symmetry. The speech is fluent and clear.  She is hypophonic.  Soft palate rises symmetrically and there is no tongue deviation. Hearing is intact to conversational tone. Sensation: Sensation is intact to light touch throughout Motor: Strength is 5/5 in the bilateral upper and lower extremities.   Shoulder shrug is equal and symmetric.  There is no pronator drift.  Movement examination: Tone: There is normal tone in the upper and lower extremities. Abnormal movements: There is mild to mod dyskinesia in the bilateral lower extremities Coordination:  There is no decremation with RAM's, with hand opening and closing, finger taps, alteration of supination/pronation of the forearm or with turning in a light bulb bilaterally. Gait and Station: The patient has no difficulty arising out of a deep-seated chair without the use of the hands.  Her legs are bent at the knees when she ambulates.  She is very unstable and festinating  LABS:  Lab Results  Component Value Date   TSH 0.80 01/15/2013   Lab Results  Component Value Date   VITAMINB12 863 01/15/2013    SPEP/UPEP negative.    Lab Results  Component Value Date   FOLATE 22.1 01/15/2013     ASSESSMENT/PLAN:  1.  Idiopathic akinetic rigid Parkinson's disease.    -We  discussed the diagnosis as well as pathophysiology of the disease.  We discussed treatment options as well as prognostic indicators.  Patient education was provided.  -Continue on carbidopa/levodopa 25/100, 2/2/1.    -  She is on mirapex er 0.75 mg daily.  She will remain on this medication.  We discussed risk, benefits, and side effects.  -Referral to neuro rehab for physical, occupational, speech therapy  -Discussed with the patient that she should be using a walker at all times.  -Add amantadine, 100 mg twice per day given dyskinesia.  Discussed extensively about risks, benefits, and side effects.  She expressed understanding. 2.  Fibromyalgia  -on cymbalta.  She would like to continue this.  -dont think that myalgias are from the addition of levodopa last September.  Stopping lipitor didn't help.  -continue gabapentin 400 mg 3 times per day.   -dry  needling wasn't helpful 3.  probable peripheral neuropathy, idiopathic.  -Back on Cymbalta both for peripheral neuropathy as well as fibromyalgia. 4.  history of melanoma.  -There is some old literature that suggests that the addition of levodopa can increase risk of return melanoma, but new literature suggests that this is not the etiology.   -She is to continue with yearly skin evaluations. 5.  history of tobacco abuse.  -Talked again today about the importance of discontinuing the nicotine gum. 6.  Insomnia with some RBD  - Will remain on clonazepam, 1 mg nightly.  7. Follow up is anticipated in the next 4-6 months, sooner should new neurologic issues arise.  Much greater than 50% of this visit was spent in counseling and coordinating care.  Total face to face time:  25 min

## 2019-04-16 ENCOUNTER — Encounter: Payer: Self-pay | Admitting: Neurology

## 2019-04-16 ENCOUNTER — Ambulatory Visit (INDEPENDENT_AMBULATORY_CARE_PROVIDER_SITE_OTHER): Payer: Medicare Other | Admitting: Neurology

## 2019-04-16 ENCOUNTER — Other Ambulatory Visit: Payer: Self-pay

## 2019-04-16 VITALS — BP 159/94 | HR 101 | Ht 65.0 in | Wt 172.4 lb

## 2019-04-16 DIAGNOSIS — G20 Parkinson's disease: Secondary | ICD-10-CM

## 2019-04-16 DIAGNOSIS — G249 Dystonia, unspecified: Secondary | ICD-10-CM | POA: Diagnosis not present

## 2019-04-16 MED ORDER — AMANTADINE HCL 100 MG PO CAPS: 100.0000 mg | ORAL_CAPSULE | Freq: Two times a day (BID) | ORAL | 1 refills | Status: DC

## 2019-04-16 NOTE — Patient Instructions (Addendum)
1.  Start amantadine - 100 mg twice per day (AM and dinner)  2.  Do not change your other medications  3. You have been referred to Neuro Rehab for therapy. They will call you directly to schedule an appointment.  Please contact them at [phone] with any questions or delay in scheduling your appointment.

## 2019-04-18 DIAGNOSIS — L820 Inflamed seborrheic keratosis: Secondary | ICD-10-CM | POA: Diagnosis not present

## 2019-04-18 DIAGNOSIS — Z85820 Personal history of malignant melanoma of skin: Secondary | ICD-10-CM | POA: Diagnosis not present

## 2019-04-18 DIAGNOSIS — D1721 Benign lipomatous neoplasm of skin and subcutaneous tissue of right arm: Secondary | ICD-10-CM | POA: Diagnosis not present

## 2019-04-18 DIAGNOSIS — L219 Seborrheic dermatitis, unspecified: Secondary | ICD-10-CM | POA: Diagnosis not present

## 2019-04-18 DIAGNOSIS — Z808 Family history of malignant neoplasm of other organs or systems: Secondary | ICD-10-CM | POA: Diagnosis not present

## 2019-04-18 DIAGNOSIS — B360 Pityriasis versicolor: Secondary | ICD-10-CM | POA: Diagnosis not present

## 2019-04-18 DIAGNOSIS — D2271 Melanocytic nevi of right lower limb, including hip: Secondary | ICD-10-CM | POA: Diagnosis not present

## 2019-04-18 DIAGNOSIS — L821 Other seborrheic keratosis: Secondary | ICD-10-CM | POA: Diagnosis not present

## 2019-05-07 ENCOUNTER — Ambulatory Visit: Payer: Medicare Other | Admitting: Cardiovascular Disease

## 2019-05-15 ENCOUNTER — Other Ambulatory Visit: Payer: Self-pay

## 2019-05-15 ENCOUNTER — Ambulatory Visit: Payer: Medicare Other | Attending: Neurology | Admitting: Physical Therapy

## 2019-05-15 ENCOUNTER — Encounter: Payer: Self-pay | Admitting: Occupational Therapy

## 2019-05-15 ENCOUNTER — Encounter: Payer: Self-pay | Admitting: Physical Therapy

## 2019-05-15 ENCOUNTER — Ambulatory Visit: Payer: Medicare Other | Admitting: Occupational Therapy

## 2019-05-15 DIAGNOSIS — R278 Other lack of coordination: Secondary | ICD-10-CM

## 2019-05-15 DIAGNOSIS — R293 Abnormal posture: Secondary | ICD-10-CM

## 2019-05-15 DIAGNOSIS — R2689 Other abnormalities of gait and mobility: Secondary | ICD-10-CM | POA: Diagnosis not present

## 2019-05-15 DIAGNOSIS — R41840 Attention and concentration deficit: Secondary | ICD-10-CM

## 2019-05-15 DIAGNOSIS — R2681 Unsteadiness on feet: Secondary | ICD-10-CM | POA: Diagnosis not present

## 2019-05-15 DIAGNOSIS — H353122 Nonexudative age-related macular degeneration, left eye, intermediate dry stage: Secondary | ICD-10-CM | POA: Diagnosis not present

## 2019-05-15 DIAGNOSIS — H353212 Exudative age-related macular degeneration, right eye, with inactive choroidal neovascularization: Secondary | ICD-10-CM | POA: Diagnosis not present

## 2019-05-15 DIAGNOSIS — R29818 Other symptoms and signs involving the nervous system: Secondary | ICD-10-CM

## 2019-05-16 NOTE — Therapy (Signed)
[facility] [address] [place], [place], [address] Phone: [phone]   Fax:  [phone]  Occupational Therapy Evaluation  Patient Details  Name: Shala Kolasinski MRN: [id number] Date of Birth: [birth year] Referring Provider (OT): Dr. Wells Guiles [name]   Encounter Date: 05/15/2019  OT End of Session - 05/16/19 0029    Visit Number  1    Number of Visits  13    Date for OT Re-Evaluation  07/15/19   delayed start at pt request due to vacation   Authorization Type  Medicare / BCBS    Authorization Time Period  05/16/19-08/14/19    Authorization - Visit Number  1    Authorization - Number of Visits  10    OT Start Time  [id number]    OT Stop Time  1315    OT Time Calculation (min)  40 min    Activity Tolerance  Patient tolerated treatment well    Behavior During Therapy  [facility] for tasks assessed/performed       Past Medical History:  Diagnosis Date  . Anxiety   . Fibromyalgia   . Hypercholesteremia    controlled on medication  . Hyperlipidemia   . Hypertension    no current meds  . LBBB (left bundle branch block)   . Melanoma ([place])   . MVP (mitral valve prolapse)   . Osteopenia   . Parkinson disease ([place])   . SVD (spontaneous vaginal delivery)    x 2    Past Surgical History:  Procedure Laterality Date  . BLEPHAROPLASTY    . cold knife conization    . COLONOSCOPY     FH colon CA  . DILATATION & CURETTAGE/HYSTEROSCOPY WITH MYOSURE N/A 02/14/2018   Procedure: ATTEMPTED DILATATION & CURETTAGE/HYSTEROSCOPY WITH MYOSURE;  Surgeon: Christophe Louis, MD;  Location: [facility];  Service: Gynecology;  Laterality: N/A;  polyp  . melanoma cancer     removed from left leg  . TONSILLECTOMY    . TUBAL LIGATION    . WISDOM TOOTH EXTRACTION      There were no vitals filed for this visit.  Subjective Assessment - 05/15/19 1236    Subjective   not so much pain as "ache" in quads, but I wouldn't call it pain    Pertinent History   Parkinson's Disease.  PMH:  anxiety, Fibromyalgia, HLD, HTN, L bundle branch block, Melanoma, mitral valve prolapse, osteopenia    Limitations  fall risk    Patient Stated Goals  be able to don L sock, improve balance    Currently in Pain?  No/denies        [facility] OT Assessment - 05/15/19 1243      Assessment   Medical Diagnosis  Parkinson's disease    Referring Provider (OT)  Dr. Wells Guiles [name]    Hand Dominance  Right    Prior Therapy  last OT d/c 05/2017      Precautions   Precautions  Fall      Balance Screen   Has the patient fallen in the past 6 months  Yes    How many times?  2   one with hit to [place] expects to be discharged to:  Private residence    Lives With  Spouse      Prior Function   Level of Independence  Independent    Leisure  personal trainer at gym (balance, strengthening) 2x week, walk with husband, inconsistent  with therapy exercises, enjoys traveling, reading, plays game on tablet      ADL   Eating/Feeding  Needs assist with cutting food   difficulty cutting, has assist when going out to eat   Grooming  Modified independent    Upper Body Bathing  Modified independent   difficulty with back, (plans to get long handled sponge)   Lower Body Bathing  Modified independent    Upper Body Dressing  --   gets arm stuck in sleeves   Lower Body Dressing  --   difficulty getting sock on L foot   Toilet Transfer  Modified independent    Toileting - Clothing Manipulation  Modified independent    Toileting -  Hygiene  Modified Independent    Tub/Shower Transfer  Modified independent    Tub/Shower Transfer Equipment  Walk in shower;Grab bars   built in seat     IADL   Prior Level of Function Light Housekeeping  has maids, does light tasks such as loading dishwasher and laundry    Prior Level of Function Meal Prep  husband performs most and has always done so; pt performs light tasks occasionally    Chief Operating Officer  on family or friends for transportation   hasn't driven in approx a year   Medication Management  Is responsible for taking medication in correct dosages at correct time    Prior Level of Function Financial Management  husband performs now due to mistake (for last 1.76yrs)      Mobility   Mobility Status  History of falls   mod I   Mobility Status Comments  uses walking stick in community, 4-wheeled walker in home (as cart)       Written Expression   Dominant Hand  Right    Handwriting  100% legible      Vision - History   Baseline Vision  Wears glasses all the time      Cognition   Overall Cognitive Status  Impaired/Different from baseline   word finding difficulty   Area of Impairment  Safety/judgement    Attention  --   decreased   Memory  Impaired    Memory Impairment  Decreased short term memory    Awareness  Impaired      Observation/Other Assessments   Standing Functional Reach Test  R-11, L-9"    Other Surveys   Select    Physical Performance Test    Yes    Simulated Eating Time (seconds)  7.78sec    Donning Doffing Jacket Time (seconds)  15.91   decr balance, 2 steps taken   Donning Doffing Jacket Comments  Fastening/unfastening 3 buttons in 31.66sec      Posture/Postural Control   Posture/Postural Control  Postural limitations    Posture Comments  --   L lateral trunk lean     Coordination   9 Hole Peg Test  Right;Left    Right 9 Hole Peg Test  20.34    Left 9 Hole Peg Test  32.72    Box and Blocks  R-52blocks, L-52blocks     Other  dyskinesias noted in trunk      Tone   Assessment Location  Right Upper Extremity;Left Upper Extremity      ROM / Strength   AROM / PROM / Strength  AROM      AROM   Overall AROM   Within functional limits for tasks performed    Overall AROM Comments  BUEs grossly [facility]  RUE Tone   RUE Tone  Within Functional Limits      LUE Tone   LUE Tone  Within Functional Limits                      OT  Education - 05/16/19 0028    Education Details  OT Eval results/POC    Person(s) Educated  Patient    Methods  Explanation    Comprehension  Verbalized understanding          OT Long Term Goals - 05/16/19 0057      OT LONG TERM GOAL #1   Title  Pt will verbalize understanding of adaptive strategies/AE to incr ease/safety with ADLs/IADLs (including balance for dressing/standing ADLs, donning/doffing shirt, cutting food).--check LTGs 06/29/19    Time  5    Period  Weeks    Status  New      OT LONG TERM GOAL #2   Title  Pt will improve balance for ADLs/IADLs as shown by demonstrating at least 10.5" functional reach with LUE.    Baseline  L-9"    Time  5    Period  Weeks    Status  New      OT LONG TERM GOAL #3   Title  Pt will improve coordination for ADLs as shown by improving time on 9-hole peg test by at least 7sec with LUE.    Time  5    Period  Weeks    Status  New      OT LONG TERM GOAL #4   Title  Pt will be able to don socks mod I using AE/strategies.    Baseline  difficulty with LLE    Time  5    Period  Weeks    Status  New      OT LONG TERM GOAL #5   Title  Pt will be independent with updated PD-specific HEP.    Baseline  --    Time  5    Period  Weeks            Plan - 05/16/19 0035    Clinical Impression Statement  Pt is a 68 y.o. female with Parkinson's disease.  Pt is familiar to this therapist from previous occupational therapy (last discharged 05/2017).  Pt with PMH that includes:anxiety, Fibromyalgia, HLD, HTN, L bundle branch block, Melanoma, mitral valve prolapse, osteopenia.  Pt reports 2 falls in last 6 months and incr difficulty slowing down with ambulation and incr difficutly with dressing LLE.  Pt presents today with decr coordination, difficulty with ADLs, hypokinesia, timing defictis with movement, decr balance/functional mobility, abnormal posture, and cognitive deficits.  Pt would benefit from occupational therapy to address these  deficits for incr ease/safety with  ADL/IADLs and to decr risk of future complications.    OT Occupational Profile and History  Comprehensive Assessment- Review of records and extensive additional review of physical, cognitive, psychosocial history related to current functional performance    Occupational performance deficits (Please refer to evaluation for details):  ADL's;IADL's;Leisure;Social Participation    Body Structure / Function / Physical Skills  ADL;Balance;Mobility;UE functional use;FMC;Coordination;GMC;IADL;Dexterity;Decreased knowledge of use of DME    Cognitive Skills  Attention;Memory;Safety Awareness;Thought    Rehab Potential  Good    Clinical Decision Making  Several treatment options, min-mod task modification necessary    Comorbidities Affecting Occupational Performance:  Presence of comorbidities impacting occupational performance    Modification or Assistance to Complete Evaluation  Min-Moderate modification of tasks or assist with assess necessary to complete eval    OT Frequency  2x / week    OT Duration  --   for 5 weeks (or 10 visits) +eval   OT Treatment/Interventions  Self-care/ADL training;Therapeutic exercise;Functional Mobility Training;Balance training;Neuromuscular education;Manual Therapy;Therapeutic activities;Energy conservation;Cryotherapy;Paraffin;DME and/or AE instruction;Cognitive remediation/compensation;Passive range of motion;Patient/family education;Moist Heat    Plan  strategies/stretching for donning socks; initiate coordination HEP, functional movements with focus on control    Consulted and Agree with Plan of Care  Patient       Patient will benefit from skilled therapeutic intervention in order to improve the following deficits and impairments:   Body Structure / Function / Physical Skills: ADL, Balance, Mobility, UE functional use, FMC, Coordination, GMC, IADL, Dexterity, Decreased knowledge of use of DME Cognitive Skills: Attention, Memory,  Safety Awareness, Thought     Visit Diagnosis: Other symptoms and signs involving the nervous system  Other lack of coordination  Attention and concentration deficit  Other abnormalities of gait and mobility  Unsteadiness on feet  Abnormal posture    Problem List Patient Active Problem List   Diagnosis Date Noted  . Cervical stenosis (uterine cervix) 02/14/2018  . Postmenopausal bleeding 02/14/2018  . LBBB (left bundle branch block) 12/25/2015  . PVCs (premature ventricular contractions) 12/25/2015  . Hyperlipidemia 12/25/2015  . Fibromyalgia 01/16/2014  . Cholelithiasis 04/25/2013  . Paralysis agitans ([place]) 01/15/2013  . Unspecified hereditary and idiopathic peripheral neuropathy 01/15/2013    [facility] 05/16/2019, 1:21 AM  [facility] [address] [place], [place], [address] Phone: [phone]   Fax:  [phone]  Name: Mie Truscott MRN: [id number] Date of Birth: 1951/02/12   Vianne Bulls, OTR/L [facility] [address] [place], [place]  [address] [phone] phone [phone] 05/16/19 1:21 AM

## 2019-05-16 NOTE — Therapy (Addendum)
[facility] [address] [place], [place], [address] Phone: [phone]   Fax:  [phone]  Physical Therapy Evaluation  Patient Details  Name: Morgan [name] MRN: [id number] Date of Birth: July 26, 1951 Referring Provider (PT): [name], Wells Guiles   Encounter Date: 05/15/2019  PT End of Session - 05/16/19 1040    Visit Number  1    Number of Visits  9    Date for PT Re-Evaluation  07/15/19    Authorization Type  Medicare/BCBS Federal    PT Start Time  1320    PT Stop Time  1400    PT Time Calculation (min)  40 min    Activity Tolerance  Patient tolerated treatment well    Behavior During Therapy  [facility] for tasks assessed/performed;Impulsive       Past Medical History:  Diagnosis Date  . Anxiety   . Fibromyalgia   . Hypercholesteremia    controlled on medication  . Hyperlipidemia   . Hypertension    no current meds  . LBBB (left bundle branch block)   . Melanoma ([place])   . MVP (mitral valve prolapse)   . Osteopenia   . Parkinson disease ([place])   . SVD (spontaneous vaginal delivery)    x 2    Past Surgical History:  Procedure Laterality Date  . BLEPHAROPLASTY    . cold knife conization    . COLONOSCOPY     FH colon CA  . DILATATION & CURETTAGE/HYSTEROSCOPY WITH MYOSURE N/A 02/14/2018   Procedure: ATTEMPTED DILATATION & CURETTAGE/HYSTEROSCOPY WITH MYOSURE;  Surgeon: Christophe Louis, MD;  Location: [facility];  Service: Gynecology;  Laterality: N/A;  polyp  . melanoma cancer     removed from left leg  . TONSILLECTOMY    . TUBAL LIGATION    . WISDOM TOOTH EXTRACTION      There were no vitals filed for this visit.   Subjective Assessment - 05/15/19 1323    Subjective  Pt reports having had several falls in the past 6 months.  Have a personal trainer, who helps with balance and strength, 2x/wk, at [employer].  I seem to be walking faster-start out slowly then walk faster.  Uses walking pole for outdoor walking; has a rollator  that she uses in the house at times.    Patient Stated Goals  Pt's goals for therapy are to slow down with my walking.    Currently in Pain?  No/denies         [facility] PT Assessment - 05/15/19 1328      Assessment   Medical Diagnosis  Parkinson's disease    Referring Provider (PT)  [name], Wells Guiles    Onset Date/Surgical Date  04/12/19   MD visit   Hand Dominance  Right      Precautions   Precautions  Fall      Balance Screen   Has the patient fallen in the past 6 months  Yes    How many times?  1-2    Has the patient had a decrease in activity level because of a fear of falling?   No    Is the patient reluctant to leave their home because of a fear of falling?   No      Home Social worker  Private residence    Living Arrangements  Spouse/significant other    Available Help at Discharge  Family    Type of [place]  to enter    Entrance Stairs-Number of Steps  1    Entrance Stairs-Rails  Right    Home Layout  Two level;Able to live on main level with bedroom/bathroom    [place] - 4 wheels   walking stick     Prior Function   Level of Independence  Independent    Leisure  personal trainer at gym (balance, strengthening) 2x week, walk with husband, inconsistent with therapy exercises, enjoys traveling, reading, plays game on tablet; recumbent bike 2x/wk      Observation/Other Assessments   Focus on Therapeutic Outcomes (FOTO)   NA      Posture/Postural Control   Posture/Postural Control  Postural limitations    Posture Comments  L lateral trunk lean      ROM / Strength   AROM / PROM / Strength  Strength      Strength   Overall Strength  Deficits    Overall Strength Comments  Grossly tested 4/5 bilateral lower extremities      Transfers   Transfers  Sit to Stand;Stand to Sit    Sit to Stand  6: Modified independent (Device/Increase time);Without upper extremity assist;From chair/3-in-1    Five time sit to stand  comments   8.73    Stand to Sit  6: Modified independent (Device/Increase time);Without upper extremity assist;To chair/3-in-1    Comments  posterior LOB into sitting, x 2, then x 1 on seconds 5x sit<>stand trial      Ambulation/Gait   Ambulation/Gait  Yes    Ambulation/Gait Assistance  6: Modified independent (Device/Increase time)    Ambulation Distance (Feet)  100 Feet    Assistive device  --   single walking stick   Gait Pattern  Step-through pattern;Step-to pattern;Festinating;Shuffle;Lateral trunk lean to left;Poor foot clearance - left;Poor foot clearance - right    Ambulation Surface  Level;Indoor    Gait velocity  8.75 sec = 3.75 ft/sec    Gait Comments  Pt demonstrates slight hesitation/festination with transition movements in small space, but reports no freezing episodes at home.  Wtih transition movements, especially posterior and lateral direction, pt has uncontrolled, multiple steps to regain balance.      Standardized Balance Assessment   Standardized Balance Assessment  Timed Up and Go Test;Berg Balance Test      Berg Balance Test   Sit to Stand  Able to stand  independently using hands    Standing Unsupported  Able to stand safely 2 minutes    Sitting with Back Unsupported but Feet Supported on Floor or Stool  Able to sit safely and securely 2 minutes    Stand to Sit  Sits independently, has uncontrolled descent    Transfers  Able to transfer safely, minor use of hands    Standing Unsupported with Eyes Closed  Able to stand 10 seconds safely    Standing Unsupported with Feet Together  Able to place feet together independently and stand 1 minute safely    From Standing, Reach Forward with Outstretched Arm  Reaches forward but needs supervision    From Standing Position, Pick up Object from Floor  Able to pick up shoe safely and easily    From Standing Position, Turn to Look Behind Over each Shoulder  Looks behind from both sides and weight shifts well    Turn 360 Degrees   Able to turn 360 degrees safely in 4 seconds or less    Standing Unsupported, Alternately Place Feet on Step/Stool  Able  to stand independently and safely and complete 8 steps in 20 seconds    Standing Unsupported, One Foot in Front  Needs help to step but can hold 15 seconds    Standing on One Leg  Able to lift leg independently and hold 5-10 seconds    Total Score  45    Berg comment:  Scores <45/56 indicate increased fall risk      Timed Up and Go Test   Normal TUG (seconds)  9.74    Cognitive TUG (seconds)  12.06    TUG Comments  >10% difference indicates difficulty with dual tasking      High Level Balance   High Level Balance Comments  Posterior push and release:  >2 steps, therapist assist to regain balance (posterior and lateral direction)                Objective measurements completed on examination: See above findings.                   PT Long Term Goals - 05/16/19 1047      PT LONG TERM GOAL #1   Title  Pt will be independent with HEP for improved functional mobility, balance, transfers.  TARGET 4 weeks (07/04/2019-due to delayed start, pt out of town)    Time  4    Period  Weeks    Status  New    Target Date  07/04/19      PT LONG TERM GOAL #2   Title  Pt will demonstrate sit<>stand transfers with safe, appropriate technique, no posterior LOB, 5 of 5 trials, for improved transfer efficiency and safety.    Time  4    Period  Weeks    Status  New    Target Date  07/04/19      PT LONG TERM GOAL #3   Title  Pt will improve Berg Balance score to at least 49/56 for decreased fall risk.    Time  4    Period  Weeks    Status  New    Target Date  07/04/19      PT LONG TERM GOAL #4   Title  Pt will verbalize understanding of fall prevention education in the home environment, including tips to reduce hastening of gait.    Time  4    Period  Weeks    Status  New    Target Date  07/01/19             Plan - 05/16/19 1041    Clinical  Impression Statement  Pt is a 68 year old female with history of Parkinson's disease, referred by Dr. Carles Collet, due to several falls in the past 6 months.  Pt presents with decreased balance, decreased timing and coordination of gait, decreased functional strength, hastening/festination of gait, abnormal posture, postural instability.  She is active with trainer 2x/wk and she and husband enjoy travelling.  She is at fall risk per Berg score of 45/56 and demonstrates taking multiple steps with posterior push and release test, requiring thearpist assist/external support for regaining balance.  Pt will benefit from skilled PT to address the above stated deficits to decrease fall risk and improve overall mobility.    Personal Factors and Comorbidities  Comorbidity 3+   PD as neurodegenerative disease   Comorbidities  See PMH    Examination-Activity Limitations  Locomotion Level;Transfers    Examination-Participation Restrictions  Community Activity   Travel   Stability/Clinical Decision Making  Evolving/Moderate complexity    Clinical Decision Making  Moderate    Rehab Potential  Good    PT Frequency  2x / week    PT Duration  4 weeks   plus eval   PT Treatment/Interventions  ADLs/Self Care Home Management;Gait training;Functional mobility training;Therapeutic activities;Therapeutic exercise;Balance training;Neuromuscular re-education;Patient/family education    PT Next Visit Plan  Initiate HEP-step strategies, hip/ankle strategies; postural exercises, fall prevention education; sit<>stand and gait initiation with improved postural control    Consulted and Agree with Plan of Care  Patient       Patient will benefit from skilled therapeutic intervention in order to improve the following deficits and impairments:  Abnormal gait, Decreased balance, Decreased mobility, Decreased strength, Difficulty walking, Postural dysfunction  Visit Diagnosis: Other abnormalities of gait and mobility - Plan: PT plan of  care cert/re-cert  Unsteadiness on feet - Plan: PT plan of care cert/re-cert  Abnormal posture - Plan: PT plan of care cert/re-cert  Other symptoms and signs involving the nervous system - Plan: PT plan of care cert/re-cert     Problem List Patient Active Problem List   Diagnosis Date Noted  . Cervical stenosis (uterine cervix) 02/14/2018  . Postmenopausal bleeding 02/14/2018  . LBBB (left bundle branch block) 12/25/2015  . PVCs (premature ventricular contractions) 12/25/2015  . Hyperlipidemia 12/25/2015  . Fibromyalgia 01/16/2014  . Cholelithiasis 04/25/2013  . Paralysis agitans ([place]) 01/15/2013  . Unspecified hereditary and idiopathic peripheral neuropathy 01/15/2013    MARRIOTT,AMY W. 05/16/2019, 11:08 AM  Frazier Butt., PT  [facility] [address] [place], [place], [address] Phone: [phone]   Fax:  [phone]  Name: Morgan [name] MRN: [id number] Date of Birth: Dec 21, 1950

## 2019-05-27 ENCOUNTER — Other Ambulatory Visit: Payer: Self-pay | Admitting: Neurology

## 2019-05-27 NOTE — Telephone Encounter (Signed)
Requested Prescriptions   Pending Prescriptions Disp Refills  . gabapentin (NEURONTIN) 400 MG capsule [Pharmacy Med Name: GABAPENTIN CAP 400MG ] 270 capsule 3    Sig: TAKE 1 CAPSULE 3 TIMES A   DAY   Rx last filled: 06/21/18 #270 3 REFILLS  Pt last seen: 04/16/19   Follow up appt scheduled: 10/22/2019

## 2019-06-05 ENCOUNTER — Other Ambulatory Visit: Payer: Self-pay | Admitting: Neurology

## 2019-06-05 MED ORDER — CLONAZEPAM 1 MG PO TABS: ORAL_TABLET | ORAL | 0 refills | Status: DC

## 2019-06-05 NOTE — Telephone Encounter (Signed)
Spoke with patient she was made aware of provider response below

## 2019-06-05 NOTE — Telephone Encounter (Signed)
Why does she think that is from the amantadine?  I haven't changed her meds in a long time.  And I don't want her increasing her clonazepam to 2 full tablets.  It can cause hang over effect and more likely to cause daytime issues

## 2019-06-05 NOTE — Telephone Encounter (Signed)
You can refill the klonopin, 1 mg at bedtime, but I am not approving double the dosage.  Insurance may not let her refill.  She should stop the amantadine if not doing well on it.

## 2019-06-05 NOTE — Telephone Encounter (Signed)
Patient is calling in about having issues with the medication -amantadine HCL 100mg ; stumbling around all over the place, cant walk normally, cant sleep, dizziness and close to falling, dry mouth, and screaming/talking at night in sleep. Now has to take 2 clonazepam for the talking/screaming in the sleep. Please advise. Thanks!

## 2019-06-05 NOTE — Telephone Encounter (Signed)
Spoke with patient she says it was the last medication that was given to her and she has had problems with the medication before. Its been over a month since she started Rx symptoms started 3-4 weeks ago.  Pt states that she is having nightmares she is waking up screaming and scaring her husband. She is out of clonazepam and is requesting refill.   [facility]

## 2019-06-07 ENCOUNTER — Ambulatory Visit: Payer: Medicare Other | Admitting: Physical Therapy

## 2019-06-11 ENCOUNTER — Encounter: Payer: Medicare Other | Admitting: Occupational Therapy

## 2019-06-11 ENCOUNTER — Ambulatory Visit: Payer: Medicare Other | Admitting: Physical Therapy

## 2019-06-12 ENCOUNTER — Ambulatory Visit: Payer: Medicare Other | Admitting: Physical Therapy

## 2019-06-12 ENCOUNTER — Ambulatory Visit: Payer: Medicare Other | Admitting: Occupational Therapy

## 2019-06-14 ENCOUNTER — Ambulatory Visit: Payer: Medicare Other

## 2019-06-14 ENCOUNTER — Ambulatory Visit: Payer: Medicare Other | Admitting: Physical Therapy

## 2019-06-14 ENCOUNTER — Ambulatory Visit: Payer: Medicare Other | Admitting: Occupational Therapy

## 2019-06-19 ENCOUNTER — Other Ambulatory Visit: Payer: Self-pay

## 2019-06-19 ENCOUNTER — Ambulatory Visit: Payer: Medicare Other | Admitting: Occupational Therapy

## 2019-06-19 ENCOUNTER — Ambulatory Visit: Payer: Medicare Other | Attending: Neurology | Admitting: Physical Therapy

## 2019-06-19 ENCOUNTER — Encounter: Payer: Self-pay | Admitting: Occupational Therapy

## 2019-06-19 ENCOUNTER — Encounter: Payer: Self-pay | Admitting: Physical Therapy

## 2019-06-19 VITALS — BP 129/89

## 2019-06-19 DIAGNOSIS — R471 Dysarthria and anarthria: Secondary | ICD-10-CM | POA: Insufficient documentation

## 2019-06-19 DIAGNOSIS — R41840 Attention and concentration deficit: Secondary | ICD-10-CM | POA: Diagnosis not present

## 2019-06-19 DIAGNOSIS — R1310 Dysphagia, unspecified: Secondary | ICD-10-CM | POA: Diagnosis not present

## 2019-06-19 DIAGNOSIS — R278 Other lack of coordination: Secondary | ICD-10-CM

## 2019-06-19 DIAGNOSIS — R293 Abnormal posture: Secondary | ICD-10-CM | POA: Diagnosis not present

## 2019-06-19 DIAGNOSIS — R2681 Unsteadiness on feet: Secondary | ICD-10-CM

## 2019-06-19 DIAGNOSIS — R29818 Other symptoms and signs involving the nervous system: Secondary | ICD-10-CM

## 2019-06-19 DIAGNOSIS — R2689 Other abnormalities of gait and mobility: Secondary | ICD-10-CM

## 2019-06-19 NOTE — Patient Instructions (Signed)
Access Code: [email or web address]  URL: [email or web address]  Date: 06/19/2019  Prepared by: [name]   Exercises Sit to Stand with Hands on Knees - 12-1008 reps - 2 sets - hold - 1-2x daily - 5x weekly Lateral Weight Shift with Arm Raise - 10 reps - 2 sets - 1x daily - 5x weekly

## 2019-06-19 NOTE — Patient Instructions (Signed)
Coordination Exercises  Perform the following exercises for 20 minutes 1 times per day. Perform with both hand(s). Perform using big movements. PWR! Hands  With arms stretched out in front of you (elbows straight), perform the following:  PWR! Rock: Move wrists up and down [employer]! Twist: Twist palms up and down BIG  Then, start with elbows bent and hands closed.  PWR! Step: Touch index finger to thumb while keeping other fingers straight. Flick fingers out BIG (thumb out/straighten fingers). Repeat with other fingers. (Step your thumb to each finger).  PWR! Hands: Push hands out BIG. Elbows straight, wrists up, fingers open and spread apart BIG. (Can also perform by pushing down on table, chair, knees. Push above head, out to the side, behind you, in front of you.)   ** Make each movement big and deliberate so that you feel the movement.  Perform at least 10 repetitions 1x/day, but perform PWR! hands throughout the day when you are having trouble using your hands (picking up/manipulating small objects, writing, eating, typing, sewing, buttoning, etc.).  Flipping Cards: Place deck of cards on the table. Flip cards over by opening your hand big to grasp and then turn your palm up big.  Deal cards: Hold 1/2 or whole deck in your hand. Use thumb to push card off top of deck with one big push.  Pick up coins and stack one at a time: Pick up with big, intentional movements. Do not drag coin to the edge. (5-10 in a stack)  Pick up 5-10 coins one at a time and hold in palm. Then, move coins from palm to fingertips one at time and place in coin bank/container.  Perform "Flicks"/hand stretches (PWR! Hands): Close hands then flick out your fingers with focus on opening hands, pulling wrists back, and extending elbows like you are pushing.

## 2019-06-19 NOTE — Therapy (Signed)
[facility] [address] [place], [place], [address] Phone: [phone]   Fax:  [phone]  Occupational Therapy Treatment  Patient Details  Name: Morgan [name] MRN: [id number] Date of Birth: January 29, 1951 Referring Provider (OT): Dr. Wells Guiles [name]   Encounter Date: 06/19/2019  OT End of Session - 06/19/19 1555    Visit Number  2    Number of Visits  13    Date for OT Re-Evaluation  07/15/19   delayed start at pt request due to vacation   Authorization Type  Medicare / BCBS    Authorization Time Period  05/16/19-08/14/19    Authorization - Visit Number  2    Authorization - Number of Visits  10    OT Start Time  [id number]    OT Stop Time  1615    OT Time Calculation (min)  40 min    Activity Tolerance  Patient tolerated treatment well    Behavior During Therapy  [facility] for tasks assessed/performed       Past Medical History:  Diagnosis Date  . Anxiety   . Fibromyalgia   . Hypercholesteremia    controlled on medication  . Hyperlipidemia   . Hypertension    no current meds  . LBBB (left bundle branch block)   . Melanoma ([place])   . MVP (mitral valve prolapse)   . Osteopenia   . Parkinson disease ([place])   . SVD (spontaneous vaginal delivery)    x 2    Past Surgical History:  Procedure Laterality Date  . BLEPHAROPLASTY    . cold knife conization    . COLONOSCOPY     FH colon CA  . DILATATION & CURETTAGE/HYSTEROSCOPY WITH MYOSURE N/A 02/14/2018   Procedure: ATTEMPTED DILATATION & CURETTAGE/HYSTEROSCOPY WITH MYOSURE;  Surgeon: Christophe Louis, MD;  Location: [facility];  Service: Gynecology;  Laterality: N/A;  polyp  . melanoma cancer     removed from left leg  . TONSILLECTOMY    . TUBAL LIGATION    . WISDOM TOOTH EXTRACTION      Vitals:   06/19/19 1554  BP: 129/89    Subjective Assessment - 06/19/19 1535    Subjective   not so much pain as "ache" in quads, but I wouldn't call it pain    Pertinent History   Parkinson's Disease.  PMH:  anxiety, Fibromyalgia, HLD, HTN, L bundle branch block, Melanoma, mitral valve prolapse, osteopenia    Limitations  fall risk    Patient Stated Goals  be able to don L sock, improve balance    Currently in Pain?  No/denies    Pain Onset  In the past 7 days           Treatment: Donning/ doffing  Left sock with big movements x 2 reps, pt demo good success. Therapist encouraged pt to stretch before performing if difficulty. Grooved pegs for increased fine motor coordination with left hand, min difficulty/ v.c                 OT Education - 06/19/19 1604    Education Details  coordination HEP , PWR! hands   Person(s) Educated  Patient    Methods  Explanation;Demonstration;Verbal cues;Handout    Comprehension  Verbalized understanding;Returned demonstration;Verbal cues required          OT Long Term Goals - 05/16/19 0057      OT LONG TERM GOAL #1   Title  Pt will verbalize understanding of adaptive strategies/AE to incr  ease/safety with ADLs/IADLs (including balance for dressing/standing ADLs, donning/doffing shirt, cutting food).--check LTGs 06/29/19    Time  5    Period  Weeks    Status  New      OT LONG TERM GOAL #2   Title  Pt will improve balance for ADLs/IADLs as shown by demonstrating at least 10.5" functional reach with LUE.    Baseline  L-9"    Time  5    Period  Weeks    Status  New      OT LONG TERM GOAL #3   Title  Pt will improve coordination for ADLs as shown by improving time on 9-hole peg test by at least 7sec with LUE.    Time  5    Period  Weeks    Status  New      OT LONG TERM GOAL #4   Title  Pt will be able to don socks mod I using AE/strategies.    Baseline  difficulty with LLE    Time  5    Period  Weeks    Status  New      OT LONG TERM GOAL #5   Title  Pt will be independent with updated PD-specific HEP.    Baseline  --    Time  5    Period  Weeks            Plan - 06/19/19 1555     Clinical Impression Statement  Pt is progressing towards goals. She demonstrates understanding of coordination HEP following review.    OT Occupational Profile and History  Comprehensive Assessment- Review of records and extensive additional review of physical, cognitive, psychosocial history related to current functional performance    Occupational performance deficits (Please refer to evaluation for details):  ADL's;IADL's;Leisure;Social Participation    Body Structure / Function / Physical Skills  ADL;Balance;Mobility;UE functional use;FMC;Coordination;GMC;IADL;Dexterity;Decreased knowledge of use of DME    Cognitive Skills  Attention;Memory;Safety Awareness;Thought    Rehab Potential  Good    Clinical Decision Making  Several treatment options, min-mod task modification necessary    Comorbidities Affecting Occupational Performance:  Presence of comorbidities impacting occupational performance    Modification or Assistance to Complete Evaluation   Min-Moderate modification of tasks or assist with assess necessary to complete eval    OT Frequency  2x / week    OT Duration  --   for 5 weeks (or 10 visits) +eval   OT Treatment/Interventions  Self-care/ADL training;Therapeutic exercise;Functional Mobility Training;Balance training;Neuromuscular education;Manual Therapy;Therapeutic activities;Energy conservation;Cryotherapy;Paraffin;DME and/or AE instruction;Cognitive remediation/compensation;Passive range of motion;Patient/family education;Moist Heat    Plan  ADL strategies, functional movements with focus on control    Consulted and Agree with Plan of Care  Patient       Patient will benefit from skilled therapeutic intervention in order to improve the following deficits and impairments:   Body Structure / Function / Physical Skills: ADL, Balance, Mobility, UE functional use, FMC, Coordination, GMC, IADL, Dexterity, Decreased knowledge of use of DME Cognitive Skills: Attention, Memory, Safety  Awareness, Thought     Visit Diagnosis: Other symptoms and signs involving the nervous system  Other lack of coordination  Attention and concentration deficit  Other abnormalities of gait and mobility    Problem List Patient Active Problem List   Diagnosis Date Noted  . Cervical stenosis (uterine cervix) 02/14/2018  . Postmenopausal bleeding 02/14/2018  . LBBB (left bundle branch block) 12/25/2015  . PVCs (premature ventricular contractions) 12/25/2015  . Hyperlipidemia  12/25/2015  . Fibromyalgia 01/16/2014  . Cholelithiasis 04/25/2013  . Paralysis agitans ([place]) 01/15/2013  . Unspecified hereditary and idiopathic peripheral neuropathy 01/15/2013    Miloh Alcocer 06/19/2019, 4:11 PM  [facility] [address] [place], [place], [address] Phone: [phone]   Fax:  [phone]  Name: Morgan [name] MRN: [id number] Date of Birth: [birth year]

## 2019-06-19 NOTE — Therapy (Signed)
[facility] [address] [place], [place], [address] Phone: [phone]   Fax:  [phone]  Physical Therapy Treatment  Patient Details  Name: Morgan [name] MRN: [id number] Date of Birth: 22-Nov-1950 Referring Provider (PT): [name], Wells Guiles   Encounter Date: 06/19/2019  PT End of Session - 06/19/19 2040    Visit Number  2    Number of Visits  9    Date for PT Re-Evaluation  08/18/19    Authorization Type  Medicare/BCBS Federal    PT Start Time  1446    PT Stop Time  1530    PT Time Calculation (min)  44 min    Activity Tolerance  Patient tolerated treatment well    Behavior During Therapy  [facility] for tasks assessed/performed       Past Medical History:  Diagnosis Date  . Anxiety   . Fibromyalgia   . Hypercholesteremia    controlled on medication  . Hyperlipidemia   . Hypertension    no current meds  . LBBB (left bundle branch block)   . Melanoma ([place])   . MVP (mitral valve prolapse)   . Osteopenia   . Parkinson disease ([place])   . SVD (spontaneous vaginal delivery)    x 2    Past Surgical History:  Procedure Laterality Date  . BLEPHAROPLASTY    . cold knife conization    . COLONOSCOPY     FH colon CA  . DILATATION & CURETTAGE/HYSTEROSCOPY WITH MYOSURE N/A 02/14/2018   Procedure: ATTEMPTED DILATATION & CURETTAGE/HYSTEROSCOPY WITH MYOSURE;  Surgeon: Christophe Louis, MD;  Location: [facility];  Service: Gynecology;  Laterality: N/A;  polyp  . melanoma cancer     removed from left leg  . TONSILLECTOMY    . TUBAL LIGATION    . WISDOM TOOTH EXTRACTION      There were no vitals filed for this visit.  Subjective Assessment - 06/19/19 1447    Subjective  Missed last week's therapy due to muscle soreness, which is calming down now.  No falls.    Patient Stated Goals  Pt's goals for therapy are to slow down with my walking.    Currently in Pain?  Yes    Pain Score  3     Pain Location  --   thigh   Pain  Orientation  Right;Left    Pain Descriptors / Indicators  Aching;Sore    Pain Type  Acute pain    Pain Onset  In the past 7 days    Pain Frequency  Intermittent    Aggravating Factors   unsure    Pain Relieving Factors  [place]                       [facility] Adult PT Treatment/Exercise - 06/19/19 1449      Transfers   Transfers  Sit to Stand;Stand to Sit    Sit to Stand  6: Modified independent (Device/Increase time);Without upper extremity assist;From bed    Stand to Sit  6: Modified independent (Device/Increase time);Without upper extremity assist;To bed    Number of Reps  Other reps (comment)   8 reps   Transfer Cueing  Cues for increased forward lean, increased control to sit; pt is able to improve with practice, repetition; at other times during PT session, pt reverts back to quick sit to stand, posterior lean      Ambulation/Gait   Ambulation/Gait  Yes    Ambulation/Gait Assistance  6: Modified independent (Device/Increase time)    Ambulation Distance (Feet)  230 Feet    Assistive device  Other (Comment)   single walking stick   Gait Pattern  Step-through pattern;Step-to pattern;Festinating;Shuffle;Lateral trunk lean to left;Poor foot clearance - left;Poor foot clearance - right;Narrow base of support    Ambulation Surface  Level;Indoor    Pre-Gait Activities  Pre gait activities to activate core muscles:  manual resistance given at hips (lateral and anterior/posterior directions); then resistance given with resisted band around waist-posterior and posterior-lateral directions    Gait Comments  With resisted gait, pt goes at very slow speed, when resistance increased or pt tries to speed up, she experiences narrow BOS and scissoring, able to regain her balance on her own.        [facility]) - 06/19/19 1458    PWR! exercises  Moves in standing    PWR! Up  x 10 reps    PWR! Rock  x 5 reps each side, then 5 reps each side with 1 UE support reaching and work on  SLS; cues to hold at end range for improved control, balance    PWR Step  x 10 reps each side, with coordinated UE movements    Comments  Verbal and visual cues provided for slowed, controlled pace       Balance Exercises - 06/19/19 1506      Balance Exercises: Standing   Stepping Strategy  Anterior;Posterior;UE support;10 reps   Forward/back stepping x 10 reps   Other Standing Exercises  Varied direction stepping, with PT>patient calling out directional stepping-pt needs cues to widen BOS upon return to midline.  Four-square step activity, forward, side, back side; then reverse directions; repeated x 4 reps.        PT Education - 06/19/19 2040    Education Details  HEP initiated-see instructions    Person(s) Educated  Patient    Methods  Explanation;Demonstration;Handout    Comprehension  Verbalized understanding;Returned demonstration;Verbal cues required          PT Long Term Goals - 06/19/19 2044      PT LONG TERM GOAL #1   Title  Pt will be independent with HEP for improved functional mobility, balance, transfers.  Updated TARGET 4 weeks (07/19/2019-due to delayed start, pt out of town)    Time  4    Period  Weeks    Status  On-going      PT LONG TERM GOAL #2   Title  Pt will demonstrate sit<>stand transfers with safe, appropriate technique, no posterior LOB, 5 of 5 trials, for improved transfer efficiency and safety.    Time  4    Period  Weeks    Status  On-going      PT LONG TERM GOAL #3   Title  Pt will improve Berg Balance score to at least 49/56 for decreased fall risk.    Time  4    Period  Weeks    Status  On-going      PT LONG TERM GOAL #4   Title  Pt will verbalize understanding of fall prevention education in the home environment, including tips to reduce hastening of gait.    Time  4    Period  Weeks    Status  On-going            Plan - 06/19/19 2041    Clinical Impression Statement  Pt has not been seen since eval on 05/15/2019, due to pt  being out of town, then cancelling last week's appointments due to not feeling well.  Pt returns today, with PT session focused on control of movement patterns with sit<>stand and directions changes.  With repeated practice, pt is able slow pace and perform well; when cues/practice removed or challenges added, pt has more unsteadiness with gait.  Pt will continue to beneift from skilled PT to address balance, gait and functional strength and mobility.  LTGs remain appropriate, with recert completed today.    Personal Factors and Comorbidities  Comorbidity 3+   PD as neurodegenerative disease   Comorbidities  See PMH    Examination-Activity Limitations  Locomotion Level;Transfers    Examination-Participation Restrictions  Community Activity   Travel   Stability/Clinical Decision Making  Evolving/Moderate complexity    Rehab Potential  Good    PT Frequency  2x / week    PT Duration  4 weeks   per recert [id number]   PT Treatment/Interventions  ADLs/Self Care Home Management;Gait training;Functional mobility training;Therapeutic activities;Therapeutic exercise;Balance training;Neuromuscular re-education;Patient/family education    PT Next Visit Plan  Review HEP-step strategies, hip/ankle strategies; postural exercises, fall prevention education; sit<>stand and gait initiation with improved postural control    Consulted and Agree with Plan of Care  Patient       Patient will benefit from skilled therapeutic intervention in order to improve the following deficits and impairments:  Abnormal gait, Decreased balance, Decreased mobility, Decreased strength, Difficulty walking, Postural dysfunction  Visit Diagnosis: Other abnormalities of gait and mobility  Unsteadiness on feet  Abnormal posture     Problem List Patient Active Problem List   Diagnosis Date Noted  . Cervical stenosis (uterine cervix) 02/14/2018  . Postmenopausal bleeding 02/14/2018  . LBBB (left bundle branch block)  12/25/2015  . PVCs (premature ventricular contractions) 12/25/2015  . Hyperlipidemia 12/25/2015  . Fibromyalgia 01/16/2014  . Cholelithiasis 04/25/2013  . Paralysis agitans ([place]) 01/15/2013  . Unspecified hereditary and idiopathic peripheral neuropathy 01/15/2013    Shavonna Corella W. 06/19/2019, 8:47 PM  Frazier Butt., PT   [facility] [address] [place], [place], [address] Phone: [phone]   Fax:  [phone]  Name: Morgan [name] MRN: [id number] Date of Birth: 11-19-1950

## 2019-06-21 ENCOUNTER — Ambulatory Visit: Payer: Medicare Other | Admitting: Physical Therapy

## 2019-06-21 ENCOUNTER — Ambulatory Visit: Payer: Medicare Other | Admitting: Occupational Therapy

## 2019-06-24 ENCOUNTER — Ambulatory Visit: Payer: Medicare Other | Admitting: Physical Therapy

## 2019-06-25 ENCOUNTER — Ambulatory Visit: Payer: Medicare Other | Admitting: Physical Therapy

## 2019-06-25 ENCOUNTER — Encounter: Payer: Medicare Other | Admitting: Occupational Therapy

## 2019-06-27 ENCOUNTER — Ambulatory Visit: Payer: Medicare Other | Admitting: Physical Therapy

## 2019-06-27 ENCOUNTER — Encounter: Payer: Medicare Other | Admitting: Occupational Therapy

## 2019-06-28 ENCOUNTER — Ambulatory Visit: Payer: Medicare Other

## 2019-06-28 ENCOUNTER — Other Ambulatory Visit: Payer: Self-pay

## 2019-06-28 ENCOUNTER — Ambulatory Visit: Payer: Medicare Other | Admitting: Physical Therapy

## 2019-06-28 ENCOUNTER — Encounter: Payer: Self-pay | Admitting: Physical Therapy

## 2019-06-28 DIAGNOSIS — R471 Dysarthria and anarthria: Secondary | ICD-10-CM | POA: Diagnosis not present

## 2019-06-28 DIAGNOSIS — R2681 Unsteadiness on feet: Secondary | ICD-10-CM

## 2019-06-28 DIAGNOSIS — R293 Abnormal posture: Secondary | ICD-10-CM | POA: Diagnosis not present

## 2019-06-28 DIAGNOSIS — R2689 Other abnormalities of gait and mobility: Secondary | ICD-10-CM | POA: Diagnosis not present

## 2019-06-28 DIAGNOSIS — R1310 Dysphagia, unspecified: Secondary | ICD-10-CM | POA: Diagnosis not present

## 2019-06-28 DIAGNOSIS — R29818 Other symptoms and signs involving the nervous system: Secondary | ICD-10-CM | POA: Diagnosis not present

## 2019-06-28 NOTE — Patient Instructions (Addendum)
.  pwr

## 2019-06-28 NOTE — Patient Instructions (Signed)
You were talking in the mid 60s dB today (normal is 70-72 dB) When you thought about talking more powerfully you were in the upper 60s dB with an occasional reminder from me

## 2019-06-28 NOTE — Therapy (Signed)
[facility] [address] [place], [place], [address] Phone: [phone]   Fax:  [phone]  Speech Language Pathology Evaluation  Patient Details  Name: Morgan [name] MRN: [id number] Date of Birth: 1951-06-20 Referring Provider (SLP): Alonza Bogus, MD   Encounter Date: 06/28/2019  End of Session - 06/28/19 1413    Visit Number  1    Number of Visits  17    Date for SLP Re-Evaluation  09/26/19   90 days   SLP Start Time  0933    SLP Stop Time   1005    SLP Time Calculation (min)  32 min    Activity Tolerance  Patient tolerated treatment well       Past Medical History:  Diagnosis Date  . Anxiety   . Fibromyalgia   . Hypercholesteremia    controlled on medication  . Hyperlipidemia   . Hypertension    no current meds  . LBBB (left bundle branch block)   . Melanoma ([place])   . MVP (mitral valve prolapse)   . Osteopenia   . Parkinson disease ([place])   . SVD (spontaneous vaginal delivery)    x 2    Past Surgical History:  Procedure Laterality Date  . BLEPHAROPLASTY    . cold knife conization    . COLONOSCOPY     FH colon CA  . DILATATION & CURETTAGE/HYSTEROSCOPY WITH MYOSURE N/A 02/14/2018   Procedure: ATTEMPTED DILATATION & CURETTAGE/HYSTEROSCOPY WITH MYOSURE;  Surgeon: Christophe Louis, MD;  Location: [facility];  Service: Gynecology;  Laterality: N/A;  polyp  . melanoma cancer     removed from left leg  . TONSILLECTOMY    . TUBAL LIGATION    . WISDOM TOOTH EXTRACTION      There were no vitals filed for this visit.  Subjective Assessment - 06/28/19 0935    Subjective  Pt denies having difficulty with voice volume prior to "having those acrylic dividers up everywhere."    Currently in Pain?  No/denies         SLP Evaluation [facility] - 06/28/19 0935      SLP Visit Information   SLP Received On  06/28/19    Referring Provider (SLP)  Alonza Bogus, MD    Onset Date  "10 years or so"    Medical Diagnosis   Parkinson's Disease      Subjective   Patient/Family Stated Goal  Pt is indecisive about if she wants to have ST or not at this time      General Information   HPI  Pt has had PD for approx 10 years. Reports she has difficulty with volume only since the addition of acrylic shields due to [id number]..      Balance Screen   Has the patient fallen in the past 6 months  Yes    How many times?  2    Has the patient had a decrease in activity level because of a fear of falling?   --   currently in PT     Prior Functional Status   Cognitive/Linguistic Baseline  Within functional limits      Cognition   Overall Cognitive Status  Within Functional Limits for tasks assessed      Auditory Comprehension   Overall Auditory Comprehension  Appears within functional limits for tasks assessed      Verbal Expression   Overall Verbal Expression  Appears within functional limits for tasks assessed      Oral Motor/Sensory  Function   Overall Oral Motor/Sensory Function  Impaired    Labial ROM  Reduced left;Reduced right   better with visual cues (verbal cues not better)   Labial Coordination  Reduced    Lingual ROM  Reduced right;Reduced left   better with cues but not WNL   Lingual Coordination  Reduced    Facial ROM  --   reduced facial expression   Overall Oral Motor/Sensory Function  slower movements overall      Motor Speech   Overall Motor Speech  Impaired    Respiration  --   chest breather   Phonation  Low vocal intensity   mid 60s dB in 3 minutes simple conversation    Articulation  Impaired    Level of Impairment  Phrase    Intelligibility  Intelligible    Effective Techniques  Increased vocal intensity   2 minutes simple conversation with occ min A-upper 60s dB                     SLP Education - 06/28/19 1412    Education Details  WNL speech volume, pt was below this volume in conversation, thearpy goals about volume, answer pt ? to why she hasn't been asked to  repeat except in situations with plexiglass barriers    Person(s) Educated  Patient    Methods  Explanation    Comprehension  Verbalized understanding       SLP Short Term Goals - 06/28/19 1419      SLP SHORT TERM GOAL #1   Title  pt will produce loud /a/ or "hey!" with at least mid 80s dB average over three sessions    Time  4    Period  Weeks    Status  New      SLP SHORT TERM GOAL #2   Title  pt will maintain upper 60s dB volume in simple conversation of 5 mintues in 3 sessions    Time  4    Period  Weeks    Status  New      SLP SHORT TERM GOAL #3   Title  pt will keep a log of daily speech practice and maintain at least 80% practice frequency    Time  4    Period  Weeks    Status  New       SLP Long Term Goals - 06/28/19 1420      SLP LONG TERM GOAL #1   Title  pt will produce loud /a/ or "hey!" with at least low 90s dB average over three sessions    Time  8    Period  Weeks    Status  New      SLP LONG TERM GOAL #2   Title  pt will use average low 70s dB speech in 10 minutes mod complex conversation over three sessions    Time  [facility] - 06/28/19 1414    Clinical Impression Statement  Pt presents today with hypophonic/dysarthric speech due to reduced breath support - speech was measured at mid 60s dB in 7 minutes simple conversation. When asked to increase pt's effort for speech and explanation that pt was softer than normal speech volume, she incr'd her volume to upper 60s in 2-3 minutes conversation with occasional min cues. Pt was curious why she has only had people ask her to repeat  since plexiglas barriers have been erected. SLP told pt that these barriers do deaden the volume of speech. In direct questioning, pt denies overt s/s aspiration with foods/liquids. Indeed, pt took sips of water x3 today without overt s/s aspiration, however pt has ID'd swallow issues during prior screens. Due to this, pt may benefit from an  objective swallow assessment (modified barium swallow or FEES) during this course of therapy. Pt would benefit from skilled ST targeting incr'd volume in conversation. She would like to have one other therapy d/c prior to beginning ST. SLP has infomed pt's PT and OT of this.    Speech Therapy Frequency  2x / week    Duration  --   8 weeks, or 17 total sessions   Treatment/Interventions  Functional tasks;SLP instruction and feedback;Compensatory strategies;Patient/family education;Internal/external aids;Cueing hierarchy;Environmental controls    Potential to Achieve Goals  Good    Potential Considerations  Cooperation/participation level   pt has been reluctant to begin ST in the past   Consulted and Agree with Plan of Care  Patient       Patient will benefit from skilled therapeutic intervention in order to improve the following deficits and impairments:   Dysarthria and anarthria  Dysphagia, unspecified type    Problem List Patient Active Problem List   Diagnosis Date Noted  . Cervical stenosis (uterine cervix) 02/14/2018  . Postmenopausal bleeding 02/14/2018  . LBBB (left bundle branch block) 12/25/2015  . PVCs (premature ventricular contractions) 12/25/2015  . Hyperlipidemia 12/25/2015  . Fibromyalgia 01/16/2014  . Cholelithiasis 04/25/2013  . Paralysis agitans ([place]) 01/15/2013  . Unspecified hereditary and idiopathic peripheral neuropathy 01/15/2013    [facility] ,[place], CCC-SLP  06/28/2019, 2:24 PM  [facility] [address] [place], [place], [address] Phone: [phone]   Fax:  [phone]  Name: Morgan [name] MRN: [id number] Date of Birth: 09-Aug-1951

## 2019-06-28 NOTE — Therapy (Signed)
[facility] [address] [place], [place], [address] Phone: [phone]   Fax:  [phone]  Physical Therapy Treatment  Patient Details  Name: Morgan [name] MRN: [id number] Date of Birth: 1951/03/15 Referring Provider (PT): [name], Wells Guiles   Encounter Date: 06/28/2019  PT End of Session - 06/28/19 1715    Visit Number  3    Number of Visits  9    Date for PT Re-Evaluation  08/18/19    Authorization Type  Medicare/BCBS Federal    PT Start Time  1017    PT Stop Time  1101    PT Time Calculation (min)  44 min    Activity Tolerance  Patient tolerated treatment well    Behavior During Therapy  [facility] for tasks assessed/performed       Past Medical History:  Diagnosis Date  . Anxiety   . Fibromyalgia   . Hypercholesteremia    controlled on medication  . Hyperlipidemia   . Hypertension    no current meds  . LBBB (left bundle branch block)   . Melanoma ([place])   . MVP (mitral valve prolapse)   . Osteopenia   . Parkinson disease ([place])   . SVD (spontaneous vaginal delivery)    x 2    Past Surgical History:  Procedure Laterality Date  . BLEPHAROPLASTY    . cold knife conization    . COLONOSCOPY     FH colon CA  . DILATATION & CURETTAGE/HYSTEROSCOPY WITH MYOSURE N/A 02/14/2018   Procedure: ATTEMPTED DILATATION & CURETTAGE/HYSTEROSCOPY WITH MYOSURE;  Surgeon: Christophe Louis, MD;  Location: [facility];  Service: Gynecology;  Laterality: N/A;  polyp  . melanoma cancer     removed from left leg  . TONSILLECTOMY    . TUBAL LIGATION    . WISDOM TOOTH EXTRACTION      There were no vitals filed for this visit.  Subjective Assessment - 06/28/19 1019    Subjective  Sometimes the muscle soreness comes back and sometimes goes away.  No falls.    Patient Stated Goals  Pt's goals for therapy are to slow down with my walking.    Currently in Pain?  No/denies    Pain Onset  In the past 7 days                        [facility] Adult PT Treatment/Exercise - 06/28/19 1023      Transfers   Transfers  Sit to Stand;Stand to Sit    Sit to Stand  6: Modified independent (Device/Increase time);Without upper extremity assist;From bed;From chair/3-in-1    Stand to Sit  6: Modified independent (Device/Increase time);Without upper extremity assist;To bed;To chair/3-in-1    Number of Reps  2 sets   mat, chair, then 10 reps from mat standing on Airex   Comments  Pt return demo understanding of sit<>stand technique with review of HEP.  When standing on Airex, pt requires cues for increased momentum for increased forward lean to initiate standing.      Ambulation/Gait   Ambulation/Gait  Yes    Ambulation/Gait Assistance  6: Modified independent (Device/Increase time)    Ambulation Distance (Feet)  400 Feet    Assistive device  Other (Comment)   single walking pole   Gait Pattern  Step-through pattern;Step-to pattern;Festinating;Shuffle;Lateral trunk lean to left;Poor foot clearance - left;Poor foot clearance - right;Narrow base of support    Ambulation Surface  Level;Indoor    Gait Comments  Gait with stop/start, forward/back/turns, changes of directions, narrow spaces; no overt LOB with gait activities.  At beginning of session, with pt carrying items, she loses balance with carrying and conversation tasks.           PWR! exercises  Moves in standing    PWR! Up  x 10 reps    PWR! [employer]! Twist  x 10 reps each side, then 5 reps each side with 1 UE support reaching and work on SLS; cues to hold at end range for improved control, balance  X 10 reps each side   PWR Step  x 10 reps each side, with coordinated UE movements    Comments  Verbal and visual cues provided for slowed, controlled Morgan      PWR! Moves performed in modified quadruped at counter:   PWR! Up for improved posture, x 10 reps, 3 second hold in upright posture PWR! Rock for improved weightshifting x 10  reps PWR! Twist for improved trunk flexibility x 10 reps PWR! Step x 5 reps each side for improved initiation of stepping-cues for increased foot clearance.    Balance Exercises - 06/28/19 1711      Balance Exercises: Standing   Stepping Strategy  Anterior;Posterior;UE support;10 reps   Cues for widened BOS, brief stop in midline   Other Standing Exercises  Varied direction stepping, with PT>patient calling out directional stepping-pt needs cues to widen BOS upon return to midline.          PT Education - 06/28/19 1715    Education Details  PWR! Moves in [place] for coordination added to HEP    Person(s) Educated  Patient    Methods  Explanation;Demonstration;Handout    Comprehension  Verbalized understanding          PT Long Term Goals - 06/19/19 2044      PT LONG TERM GOAL #1   Title  Pt will be independent with HEP for improved functional mobility, balance, transfers.  Updated TARGET 4 weeks (07/19/2019-due to delayed start, pt out of town)    Time  4    Period  Weeks    Status  On-going      PT LONG TERM GOAL #2   Title  Pt will demonstrate sit<>stand transfers with safe, appropriate technique, no posterior LOB, 5 of 5 trials, for improved transfer efficiency and safety.    Time  4    Period  Weeks    Status  On-going      PT LONG TERM GOAL #3   Title  Pt will improve Berg Balance score to at least 49/56 for decreased fall risk.    Time  4    Period  Weeks    Status  On-going      PT LONG TERM GOAL #4   Title  Pt will verbalize understanding of fall prevention education in the home environment, including tips to reduce hastening of gait.    Time  4    Period  Weeks    Status  On-going            Plan - 06/28/19 1717    Clinical Impression Statement  Focus of skilled PT session this visit is slowed, coordinated Morgan with sit to stand transfers as well as standing/modified quadruped PWR! Moves and multi-directional stepping.  Added  conversation tasks, as well as challenge of varied direction stepping.  With cues and focus to task, pt maintains balance well.  Occasional LOB  and narrowed BOS, with pt able to recover/correct.  Pt will conitnue to benefit from skilled PT to address balance, gait, for improved overall functional mobility.    Personal Factors and Comorbidities  Comorbidity 3+   PD as neurodegenerative disease   Comorbidities  See PMH    Examination-Activity Limitations  Locomotion Level;Transfers    Examination-Participation Restrictions  Community Activity   Travel   Stability/Clinical Decision Making  Evolving/Moderate complexity    Rehab Potential  Good    PT Frequency  2x / week    PT Duration  4 weeks   per recert [id number]   PT Treatment/Interventions  ADLs/Self Care Home Management;Gait training;Functional mobility training;Therapeutic activities;Therapeutic exercise;Balance training;Neuromuscular re-education;Patient/family education    PT Next Visit Plan  Review standing PWR! Moves added to HEP-step strategies, hip/ankle strategies; postural exercises, fall prevention education; sit<>stand and gait initiation with improved postural control.  Work on carry/cognitive tasks with gait and balance activities for added challenge    Consulted and Agree with Plan of Care  Patient       Patient will benefit from skilled therapeutic intervention in order to improve the following deficits and impairments:  Abnormal gait, Decreased balance, Decreased mobility, Decreased strength, Difficulty walking, Postural dysfunction  Visit Diagnosis: Unsteadiness on feet  Abnormal posture  Other abnormalities of gait and mobility     Problem List Patient Active Problem List   Diagnosis Date Noted  . Cervical stenosis (uterine cervix) 02/14/2018  . Postmenopausal bleeding 02/14/2018  . LBBB (left bundle branch block) 12/25/2015  . PVCs (premature ventricular contractions) 12/25/2015  . Hyperlipidemia 12/25/2015   . Fibromyalgia 01/16/2014  . Cholelithiasis 04/25/2013  . Paralysis agitans ([place]) 01/15/2013  . Unspecified hereditary and idiopathic peripheral neuropathy 01/15/2013    Frazier Butt. 06/28/2019, 5:22 PM  [name], PT 06/28/19 5:26 PM Phone: [phone] Fax: [facility] [address] [place], [place], [address] Phone: [phone]   Fax:  [phone]  Name: Kaliyana Clere MRN: [id number] Date of Birth: July 02, 1951

## 2019-07-02 ENCOUNTER — Ambulatory Visit: Payer: Medicare Other | Admitting: Physical Therapy

## 2019-07-02 ENCOUNTER — Encounter: Payer: Medicare Other | Admitting: Occupational Therapy

## 2019-07-03 ENCOUNTER — Ambulatory Visit: Payer: Medicare Other | Admitting: Physical Therapy

## 2019-07-03 ENCOUNTER — Other Ambulatory Visit: Payer: Self-pay

## 2019-07-03 ENCOUNTER — Ambulatory Visit: Payer: Medicare Other | Attending: Neurology | Admitting: Occupational Therapy

## 2019-07-03 ENCOUNTER — Encounter: Payer: Self-pay | Admitting: Physical Therapy

## 2019-07-03 DIAGNOSIS — R2689 Other abnormalities of gait and mobility: Secondary | ICD-10-CM | POA: Insufficient documentation

## 2019-07-03 DIAGNOSIS — R2681 Unsteadiness on feet: Secondary | ICD-10-CM

## 2019-07-03 DIAGNOSIS — R1310 Dysphagia, unspecified: Secondary | ICD-10-CM | POA: Insufficient documentation

## 2019-07-03 DIAGNOSIS — R29818 Other symptoms and signs involving the nervous system: Secondary | ICD-10-CM | POA: Insufficient documentation

## 2019-07-03 DIAGNOSIS — R278 Other lack of coordination: Secondary | ICD-10-CM | POA: Insufficient documentation

## 2019-07-03 DIAGNOSIS — R41840 Attention and concentration deficit: Secondary | ICD-10-CM | POA: Insufficient documentation

## 2019-07-03 DIAGNOSIS — R293 Abnormal posture: Secondary | ICD-10-CM | POA: Diagnosis not present

## 2019-07-03 DIAGNOSIS — R471 Dysarthria and anarthria: Secondary | ICD-10-CM | POA: Insufficient documentation

## 2019-07-03 NOTE — Patient Instructions (Signed)

## 2019-07-03 NOTE — Therapy (Signed)
[facility] [address] [place], [place], [address] Phone: [phone]   Fax:  [phone]  Physical Therapy Treatment  Patient Details  Name: Morgan [name] MRN: [id number] Date of Birth: September 20, 1950 Referring Provider (PT): [name], Wells Guiles   Encounter Date: 07/03/2019  PT End of Session - 07/03/19 2227    Visit Number  4    Number of Visits  9    Date for PT Re-Evaluation  08/18/19    Authorization Type  Medicare/BCBS Federal    PT Start Time  1449    PT Stop Time  1529    PT Time Calculation (min)  40 min    Activity Tolerance  Patient tolerated treatment well    Behavior During Therapy  [facility] for tasks assessed/performed       Past Medical History:  Diagnosis Date  . Anxiety   . Fibromyalgia   . Hypercholesteremia    controlled on medication  . Hyperlipidemia   . Hypertension    no current meds  . LBBB (left bundle branch block)   . Melanoma ([place])   . MVP (mitral valve prolapse)   . Osteopenia   . Parkinson disease ([place])   . SVD (spontaneous vaginal delivery)    x 2    Past Surgical History:  Procedure Laterality Date  . BLEPHAROPLASTY    . cold knife conization    . COLONOSCOPY     FH colon CA  . DILATATION & CURETTAGE/HYSTEROSCOPY WITH MYOSURE N/A 02/14/2018   Procedure: ATTEMPTED DILATATION & CURETTAGE/HYSTEROSCOPY WITH MYOSURE;  Surgeon: Christophe Louis, MD;  Location: [facility];  Service: Gynecology;  Laterality: N/A;  polyp  . melanoma cancer     removed from left leg  . TONSILLECTOMY    . TUBAL LIGATION    . WISDOM TOOTH EXTRACTION      There were no vitals filed for this visit.  Subjective Assessment - 07/03/19 1450    Subjective  Nothing new; no falls.  Just a little pain from the stretches and exercises.    Patient Stated Goals  Pt's goals for therapy are to slow down with my walking.    Currently in Pain?  Yes    Pain Score  3     Pain Location  Leg    Pain Orientation  Right;Left    Pain Descriptors / Indicators  Sore    Pain Type  Chronic pain    Pain Onset  In the past 7 days    Pain Frequency  Intermittent    Aggravating Factors   unsure    Pain Relieving Factors  cream                       [facility] Adult PT Treatment/Exercise - 07/03/19 1450      Transfers   Transfers  Sit to Stand;Stand to Sit    Sit to Stand  7: Independent;Without upper extremity assist;From bed    Stand to Sit  7: Independent;Without upper extremity assist;To bed    Number of Reps  10 reps    Comments  Upon standing each time, pt performs PWR! Up      Ambulation/Gait   Ambulation/Gait  Yes    Ambulation/Gait Assistance  6: Modified independent (Device/Increase time);5: Supervision   Supervision with obstacle negotiation   Ambulation/Gait Assistance Details  Indoors and outdoors gait, negotiating obstacles outdoors    Ambulation Distance (Feet)  500 Feet   200   Assistive device  --  single walking pole   Gait Pattern  Step-through pattern;Step-to pattern;Festinating;Shuffle;Lateral trunk lean to left;Poor foot clearance - left;Poor foot clearance - right;Narrow base of support    Ambulation Surface  Level;Indoor     Gait with environmental scanning outdoors, no LOB, but overall slowed pace   [facility]) - 07/03/19 1453    PWR! exercises  Moves in standing    PWR! Up  x 10 reps    PWR! Rock  x 10 reps each side    PWR! Twist  x 10 reps each side    PWR Step  x 10 reps each side, cues for increased step height    Comments  Pt return demo understanding of standing PWR! Moves, keeping slow, deliberate pace throughout       Balance Exercises - 07/03/19 1621      Balance Exercises: Standing   Wall Bumps  Hip    Wall Bumps-Hips  Anterior/posterior;10 reps   at counter, cues to keep knees straight   Rockerboard  Anterior/posterior;Lateral;Head turns;5 reps   Head nods   Heel Raises Limitations  x 10    Toe Raise Limitations  x 10    Other Standing Exercises   Standing on rockerboard, forward step taps x 10 reps, then in ant/post position, alternating arm swing x 10 reps, balance perturbations in anterior/posterior direction,multiple reps, with pt experiencing 3 episodes of posterior LOB, requiring extra time/PT assist to come back to midline.       Self Care:   Discussed POC, including plans to work towards [place] and likely d/c next week; fall prevention education, including continued need to  SLOW pace for gait  PT Education - 07/03/19 2227    Education Details  Fall prevention education    Person(s) Educated  Patient    Methods  Explanation;Handout    Comprehension  Verbalized understanding          PT Long Term Goals - 06/19/19 2044      PT LONG TERM GOAL #1   Title  Pt will be independent with HEP for improved functional mobility, balance, transfers.  Updated TARGET 4 weeks (07/19/2019-due to delayed start, pt out of town)    Time  4    Period  Weeks    Status  On-going      PT LONG TERM GOAL #2   Title  Pt will demonstrate sit<>stand transfers with safe, appropriate technique, no posterior LOB, 5 of 5 trials, for improved transfer efficiency and safety.    Time  4    Period  Weeks    Status  On-going      PT LONG TERM GOAL #3   Title  Pt will improve Berg Balance score to at least 49/56 for decreased fall risk.    Time  4    Period  Weeks    Status  On-going      PT LONG TERM GOAL #4   Title  Pt will verbalize understanding of fall prevention education in the home environment, including tips to reduce hastening of gait.    Time  4    Period  Weeks    Status  On-going            Plan - 07/03/19 2228    Clinical Impression Statement  Review of HEP, with pt demo understanding of slowed, deliberate pace with exercises for improved posture, balance, trunk flexibility.  Addressed hip/ankle/step strategy with balance perturbations, with pt experiencing several LOB in posterior direction.  Pt will continue to benefit from  skilled PT to address balance, gait for improved overall functional mobility.    Personal Factors and Comorbidities  Comorbidity 3+   PD as neurodegenerative disease   Comorbidities  See PMH    Examination-Activity Limitations  Locomotion Level;Transfers    Examination-Participation Restrictions  Community Activity   Travel   Stability/Clinical Decision Making  Evolving/Moderate complexity    Rehab Potential  Good    PT Frequency  2x / week    PT Duration  4 weeks   per recert [id number]   PT Treatment/Interventions  ADLs/Self Care Home Management;Gait training;Functional mobility training;Therapeutic activities;Therapeutic exercise;Balance training;Neuromuscular re-education;Patient/family education    PT Next Visit Plan  Step strategies, hip/ankle strategies; postural exercises, sit<>stand and gait initiation with improved postural control.  Work on Veterinary surgeon tasks with gait and balance activities for added challenge    Consulted and Agree with Plan of Care  Patient       Patient will benefit from skilled therapeutic intervention in order to improve the following deficits and impairments:  Abnormal gait, Decreased balance, Decreased mobility, Decreased strength, Difficulty walking, Postural dysfunction  Visit Diagnosis: Other abnormalities of gait and mobility  Unsteadiness on feet     Problem List Patient Active Problem List   Diagnosis Date Noted  . Cervical stenosis (uterine cervix) 02/14/2018  . Postmenopausal bleeding 02/14/2018  . LBBB (left bundle branch block) 12/25/2015  . PVCs (premature ventricular contractions) 12/25/2015  . Hyperlipidemia 12/25/2015  . Fibromyalgia 01/16/2014  . Cholelithiasis 04/25/2013  . Paralysis agitans ([place]) 01/15/2013  . Unspecified hereditary and idiopathic peripheral neuropathy 01/15/2013    Layni Kreamer W. 07/03/2019, 10:33 PM Frazier Butt., PT  [facility] [address] [place], [place], [address] Phone: [phone]   Fax:  [phone]  Name: Ernie Tuccio MRN: [id number] Date of Birth: 1951/04/18

## 2019-07-03 NOTE — Therapy (Signed)
[facility] [address] [place], [place], [address] Phone: [phone]   Fax:  [phone]  Occupational Therapy Treatment  Patient Details  Name: Morgan [name] MRN: [id number] Date of Birth: 1950-10-28 Referring Provider (OT): Dr. Wells Guiles [name]   Encounter Date: 07/03/2019  OT End of Session - 07/03/19 1555    Visit Number  3    Number of Visits  13    Date for OT Re-Evaluation  07/15/19   delayed start at pt request due to vacation   Authorization Type  Medicare / BCBS    Authorization Time Period  05/16/19-08/14/19    Authorization - Visit Number  3    Authorization - Number of Visits  10    OT Start Time  [id number]    OT Stop Time  1445    OT Time Calculation (min)  40 min    Activity Tolerance  Patient tolerated treatment well    Behavior During Therapy  [facility] for tasks assessed/performed       Past Medical History:  Diagnosis Date  . Anxiety   . Fibromyalgia   . Hypercholesteremia    controlled on medication  . Hyperlipidemia   . Hypertension    no current meds  . LBBB (left bundle branch block)   . Melanoma ([place])   . MVP (mitral valve prolapse)   . Osteopenia   . Parkinson disease ([place])   . SVD (spontaneous vaginal delivery)    x 2    Past Surgical History:  Procedure Laterality Date  . BLEPHAROPLASTY    . cold knife conization    . COLONOSCOPY     FH colon CA  . DILATATION & CURETTAGE/HYSTEROSCOPY WITH MYOSURE N/A 02/14/2018   Procedure: ATTEMPTED DILATATION & CURETTAGE/HYSTEROSCOPY WITH MYOSURE;  Surgeon: Christophe Louis, MD;  Location: [facility];  Service: Gynecology;  Laterality: N/A;  polyp  . melanoma cancer     removed from left leg  . TONSILLECTOMY    . TUBAL LIGATION    . WISDOM TOOTH EXTRACTION      There were no vitals filed for this visit.               Treatment: PWR! Seated basic 4, 10 reps each as warm up, min v.c for controlled movements and avoiding hyperextension. Bag  exercise to simulate pulling up sock, 2 reps each hand, Pt practiced doffing then donned sock without difficulty,  modified independently. Standing to copy small peg design with LUE for increased fine motor coordination, functional reach and standing balance with a cognitive component. Increased time and min v.c for larger amplitude movements and more controlled movements.              OT Long Term Goals - 07/03/19 1557      OT LONG TERM GOAL #1   Title  Pt will verbalize understanding of adaptive strategies/AE to incr ease/safety with ADLs/IADLs (including balance for dressing/standing ADLs, donning/doffing shirt, cutting food).--check LTGs 06/29/19    Time  5    Period  Weeks    Status  On-going      OT LONG TERM GOAL #2   Title  Pt will improve balance for ADLs/IADLs as shown by demonstrating at least 10.5" functional reach with LUE.    Baseline  L-9"    Time  5    Period  Weeks    Status  On-going      OT LONG TERM GOAL #3   Title  Pt  will improve coordination for ADLs as shown by improving time on 9-hole peg test by at least 7sec with LUE.    Time  5    Period  Weeks    Status  On-going      OT LONG TERM GOAL #4   Title  Pt will be able to don socks mod I using AE/strategies.    Baseline  difficulty with LLE    Time  5    Period  Weeks    Status  Achieved      OT LONG TERM GOAL #5   Title  Pt will be independent with updated PD-specific HEP.    Baseline  --    Time  5    Period  Weeks    Status  On-going            Plan - 07/03/19 1556    Clinical Impression Statement  Pt is progressing towards goals. She demonstrates improving ease with donning/ doffing sock    OT Occupational Profile and History  Comprehensive Assessment- Review of records and extensive additional review of physical, cognitive, psychosocial history related to current functional performance    Occupational performance deficits (Please refer to evaluation for details):   ADL's;IADL's;Leisure;Social Participation    Body Structure / Function / Physical Skills  ADL;Balance;Mobility;UE functional use;FMC;Coordination;GMC;IADL;Dexterity;Decreased knowledge of use of DME    Cognitive Skills  Attention;Memory;Safety Awareness;Thought    Rehab Potential  Good    Clinical Decision Making  Several treatment options, min-mod task modification necessary    Comorbidities Affecting Occupational Performance:  Presence of comorbidities impacting occupational performance    Modification or Assistance to Complete Evaluation   Min-Moderate modification of tasks or assist with assess necessary to complete eval    OT Frequency  2x / week    OT Duration  --   for 5 weeks (or 10 visits) +eval   OT Treatment/Interventions  Self-care/ADL training;Therapeutic exercise;Functional Mobility Training;Balance training;Neuromuscular education;Manual Therapy;Therapeutic activities;Energy conservation;Cryotherapy;Paraffin;DME and/or AE instruction;Cognitive remediation/compensation;Passive range of motion;Patient/family education;Moist Heat    Plan  ADL strategies, functional movements with focus on control    Consulted and Agree with Plan of Care  Patient       Patient will benefit from skilled therapeutic intervention in order to improve the following deficits and impairments:   Body Structure / Function / Physical Skills: ADL, Balance, Mobility, UE functional use, FMC, Coordination, GMC, IADL, Dexterity, Decreased knowledge of use of DME Cognitive Skills: Attention, Memory, Safety Awareness, Thought     Visit Diagnosis: Abnormal posture  Other abnormalities of gait and mobility  Other symptoms and signs involving the nervous system  Other lack of coordination  Attention and concentration deficit    Problem List Patient Active Problem List   Diagnosis Date Noted  . Cervical stenosis (uterine cervix) 02/14/2018  . Postmenopausal bleeding 02/14/2018  . LBBB (left bundle  branch block) 12/25/2015  . PVCs (premature ventricular contractions) 12/25/2015  . Hyperlipidemia 12/25/2015  . Fibromyalgia 01/16/2014  . Cholelithiasis 04/25/2013  . Paralysis agitans ([place]) 01/15/2013  . Unspecified hereditary and idiopathic peripheral neuropathy 01/15/2013    Latorria Zeoli 07/03/2019, 3:58 PM  [facility] [address] [place], [place], [address] Phone: [phone]   Fax:  [phone]  Name: Abiageal Yuan MRN: [id number] Date of Birth: 01-26-1951

## 2019-07-04 ENCOUNTER — Ambulatory Visit: Payer: Medicare Other | Admitting: Physical Therapy

## 2019-07-04 ENCOUNTER — Encounter: Payer: Self-pay | Admitting: Neurology

## 2019-07-04 ENCOUNTER — Encounter: Payer: Medicare Other | Admitting: Occupational Therapy

## 2019-07-05 ENCOUNTER — Ambulatory Visit: Payer: Medicare Other | Admitting: Physical Therapy

## 2019-07-05 ENCOUNTER — Ambulatory Visit: Payer: Medicare Other | Admitting: Occupational Therapy

## 2019-07-05 DIAGNOSIS — Z0279 Encounter for issue of other medical certificate: Secondary | ICD-10-CM

## 2019-07-10 ENCOUNTER — Ambulatory Visit: Payer: Medicare Other

## 2019-07-10 ENCOUNTER — Other Ambulatory Visit: Payer: Self-pay

## 2019-07-10 ENCOUNTER — Ambulatory Visit: Payer: Medicare Other | Admitting: Physical Therapy

## 2019-07-10 ENCOUNTER — Encounter: Payer: Medicare Other | Admitting: Occupational Therapy

## 2019-07-10 ENCOUNTER — Encounter: Payer: Self-pay | Admitting: Physical Therapy

## 2019-07-10 DIAGNOSIS — R29818 Other symptoms and signs involving the nervous system: Secondary | ICD-10-CM | POA: Diagnosis not present

## 2019-07-10 DIAGNOSIS — R471 Dysarthria and anarthria: Secondary | ICD-10-CM | POA: Diagnosis not present

## 2019-07-10 DIAGNOSIS — R293 Abnormal posture: Secondary | ICD-10-CM | POA: Diagnosis not present

## 2019-07-10 DIAGNOSIS — R278 Other lack of coordination: Secondary | ICD-10-CM | POA: Diagnosis not present

## 2019-07-10 DIAGNOSIS — R2681 Unsteadiness on feet: Secondary | ICD-10-CM

## 2019-07-10 DIAGNOSIS — R2689 Other abnormalities of gait and mobility: Secondary | ICD-10-CM

## 2019-07-10 DIAGNOSIS — R41840 Attention and concentration deficit: Secondary | ICD-10-CM | POA: Diagnosis not present

## 2019-07-10 DIAGNOSIS — R1310 Dysphagia, unspecified: Secondary | ICD-10-CM

## 2019-07-10 NOTE — Therapy (Signed)
[facility] [address] [place], [place], [address] Phone: [phone]   Fax:  [phone]  Physical Therapy Treatment  Patient Details  Name: Morgan [name] MRN: [id number] Date of Birth: February 15, 1951 Referring Provider (PT): [name], Wells Guiles   Encounter Date: 07/10/2019  PT End of Session - 07/10/19 1728    Visit Number  5    Number of Visits  9    Date for PT Re-Evaluation  08/18/19    Authorization Type  Medicare/BCBS Federal    PT Start Time  1446    PT Stop Time  1529    PT Time Calculation (min)  43 min    Activity Tolerance  Patient tolerated treatment well    Behavior During Therapy  [facility] for tasks assessed/performed       Past Medical History:  Diagnosis Date  . Anxiety   . Fibromyalgia   . Hypercholesteremia    controlled on medication  . Hyperlipidemia   . Hypertension    no current meds  . LBBB (left bundle branch block)   . Melanoma ([place])   . MVP (mitral valve prolapse)   . Osteopenia   . Parkinson disease ([place])   . SVD (spontaneous vaginal delivery)    x 2    Past Surgical History:  Procedure Laterality Date  . BLEPHAROPLASTY    . cold knife conization    . COLONOSCOPY     FH colon CA  . DILATATION & CURETTAGE/HYSTEROSCOPY WITH MYOSURE N/A 02/14/2018   Procedure: ATTEMPTED DILATATION & CURETTAGE/HYSTEROSCOPY WITH MYOSURE;  Surgeon: Christophe Louis, MD;  Location: [facility];  Service: Gynecology;  Laterality: N/A;  polyp  . melanoma cancer     removed from left leg  . TONSILLECTOMY    . TUBAL LIGATION    . WISDOM TOOTH EXTRACTION      There were no vitals filed for this visit.  Subjective Assessment - 07/10/19 1449    Subjective  My body has just been so achy-between the PT/OT exercises and the trainer's exercises.  Haven't been able to do the exercies this week.    Patient Stated Goals  Pt's goals for therapy are to slow down with my walking.    Currently in Pain?  Yes    Pain Score  4      Pain Location  Leg    Pain Orientation  Right;Left    Pain Descriptors / Indicators  Sore    Pain Type  Chronic pain    Pain Onset  In the past 7 days    Aggravating Factors   weather aggravates    Pain Relieving Factors  cream                       [facility] Adult PT Treatment/Exercise - 07/10/19 0001      Transfers   Transfers  Sit to Stand;Stand to Sit    Sit to Stand  7: Independent;Without upper extremity assist;From bed    Stand to Sit  7: Independent;Without upper extremity assist;To bed    Number of Reps  Other reps (comment);Other sets (comment)   3 sets 5 reps    Comments  Performed cueing from elevated mat surface to simulate bed height (for days when pt's quads are hurting and she can't do lower surfaces).  Also practiced from regular mat height with use of UEs to help "boost" up to stand, and then with use of momentum through trunk to help to stand.  Ambulation/Gait   Ambulation/Gait  Yes    Ambulation/Gait Assistance  5: Supervision    Ambulation/Gait Assistance Details  Gait with bilateral hand carrying tasks, and conversation tasks with gait.    Ambulation Distance (Feet)  250 Feet   150 ft walking pole   Assistive device  None    Gait Pattern  Step-through pattern;Step-to pattern;Festinating;Shuffle;Lateral trunk lean to left;Poor foot clearance - left;Poor foot clearance - right;Narrow base of support   Veers to left at times   Ambulation Surface  Level;Indoor    Gait Comments  Cues for slowed pace, STOP and reset if she experiences festinating or hastening with gait (especially with carrying items or dual tasking).      Standardized Balance Assessment   Standardized Balance Assessment  Berg Balance Test      Berg Balance Test   Sit to Stand  Able to stand without using hands and stabilize independently    Standing Unsupported  Able to stand safely 2 minutes    Sitting with Back Unsupported but Feet Supported on Floor or Stool  Able to sit  safely and securely 2 minutes    Stand to Sit  Sits safely with minimal use of hands    Transfers  Able to transfer safely, minor use of hands    Standing Unsupported with Eyes Closed  Able to stand 10 seconds safely    Standing Ubsupported with Feet Together  Able to place feet together independently and stand 1 minute safely    From Standing, Reach Forward with Outstretched Arm  Can reach confidently >25 cm (10")    From Standing Position, Pick up Object from Floor  Able to pick up shoe safely and easily    From Standing Position, Turn to Look Behind Over each Shoulder  Looks behind from both sides and weight shifts well    Turn 360 Degrees  Able to turn 360 degrees safely but slowly    Standing Unsupported, Alternately Place Feet on Step/Stool  Able to stand independently and safely and complete 8 steps in 20 seconds    Standing Unsupported, One Foot in Front  Able to take small step independently and hold 30 seconds    Standing on One Leg  Able to lift leg independently and hold > 10 seconds    Total Score  52      High Level Balance   High Level Balance Comments  Worked on turns, including quarter turns to R and L, full circle, then 90 degrees and 180 degrees, several reps each.  Cues to lead with the foot in the direction she is turning.  Also practiced quarter turn and initiating gait, to R and L 2 reps each.      Exercises   Exercises  Knee/Hip      Knee/Hip Exercises: Stretches   Quad Stretch  Right;Left;2 reps;30 seconds             PT Education - 07/10/19 1727    Education Details  Updates/additions to HEP-see instructions    Person(s) Educated  Patient    Methods  Explanation;Demonstration;Handout    Comprehension  Verbalized understanding;Returned demonstration;Verbal cues required          PT Long Term Goals - 07/10/19 1512      PT LONG TERM GOAL #1   Title  Pt will be independent with HEP for improved functional mobility, balance, transfers.  Updated TARGET  4 weeks (07/19/2019-due to delayed start, pt out of town)  Time  4    Period  Weeks    Status  On-going      PT LONG TERM GOAL #2   Title  Pt will demonstrate sit<>stand transfers with safe, appropriate technique, no posterior LOB, 5 of 5 trials, for improved transfer efficiency and safety.    Time  4    Period  Weeks    Status  On-going      PT LONG TERM GOAL #3   Title  Pt will improve Berg Balance score to at least 49/56 for decreased fall risk.    Time  4    Period  Weeks    Status  Achieved      PT LONG TERM GOAL #4   Title  Pt will verbalize understanding of fall prevention education in the home environment, including tips to reduce hastening of gait.    Time  4    Period  Weeks    Status  On-going            Plan - 07/10/19 1730    Clinical Impression Statement  Assessed Morgan [name] today, with pt improving Berg score to 52/56.  Worked on turns and dual tasking with gait as well as practice for varied surfaces and situations for sit<>stand for funcitonal strengthening.  Pt is able to demonstrate slowed pace throughout PT sessions, but per pt report, hasn't been consistent with HEP due to whole body aching over the past week.    Personal Factors and Comorbidities  Comorbidity 3+   PD as neurodegenerative disease   Comorbidities  See PMH    Examination-Activity Limitations  Locomotion Level;Transfers    Examination-Participation Restrictions  Community Activity   Travel   Stability/Clinical Decision Making  Evolving/Moderate complexity    Rehab Potential  Good    PT Frequency  2x / week    PT Duration  4 weeks   per recert [id number]   PT Treatment/Interventions  ADLs/Self Care Home Management;Gait training;Functional mobility training;Therapeutic activities;Therapeutic exercise;Balance training;Neuromuscular re-education;Patient/family education    PT Next Visit Plan  Check remaining goals and plan for d/c.    Consulted and Agree with Plan of Care  Patient        Patient will benefit from skilled therapeutic intervention in order to improve the following deficits and impairments:  Abnormal gait, Decreased balance, Decreased mobility, Decreased strength, Difficulty walking, Postural dysfunction  Visit Diagnosis: Other abnormalities of gait and mobility  Unsteadiness on feet     Problem List Patient Active Problem List   Diagnosis Date Noted  . Cervical stenosis (uterine cervix) 02/14/2018  . Postmenopausal bleeding 02/14/2018  . LBBB (left bundle branch block) 12/25/2015  . PVCs (premature ventricular contractions) 12/25/2015  . Hyperlipidemia 12/25/2015  . Fibromyalgia 01/16/2014  . Cholelithiasis 04/25/2013  . Paralysis agitans ([place]) 01/15/2013  . Unspecified hereditary and idiopathic peripheral neuropathy 01/15/2013    Zyon Grout W. 07/10/2019, 5:33 PM  Frazier Butt., PT   [facility] [address] [place], [place], [address] Phone: [phone]   Fax:  [phone]  Name: Morgan [name] MRN: [id number] Date of Birth: 12/07/1950

## 2019-07-10 NOTE — Therapy (Signed)
[facility] [address] [place], [place], [address] Phone: [phone]   Fax:  [phone]  Speech Language Pathology Treatment  Patient Details  Name: Morgan [name] MRN: [id number] Date of Birth: February 25, 1951 Referring Provider (SLP): Alonza Bogus, MD   Encounter Date: 07/10/2019  End of Session - 07/10/19 1640    Visit Number  2    Number of Visits  17    Date for SLP Re-Evaluation  09/26/19    SLP Start Time  [id number]    SLP Stop Time   1600    SLP Time Calculation (min)  27 min       Past Medical History:  Diagnosis Date  . Anxiety   . Fibromyalgia   . Hypercholesteremia    controlled on medication  . Hyperlipidemia   . Hypertension    no current meds  . LBBB (left bundle branch block)   . Melanoma ([place])   . MVP (mitral valve prolapse)   . Osteopenia   . Parkinson disease ([place])   . SVD (spontaneous vaginal delivery)    x 2    Past Surgical History:  Procedure Laterality Date  . BLEPHAROPLASTY    . cold knife conization    . COLONOSCOPY     FH colon CA  . DILATATION & CURETTAGE/HYSTEROSCOPY WITH MYOSURE N/A 02/14/2018   Procedure: ATTEMPTED DILATATION & CURETTAGE/HYSTEROSCOPY WITH MYOSURE;  Surgeon: Christophe Louis, MD;  Location: [facility];  Service: Gynecology;  Laterality: N/A;  polyp  . melanoma cancer     removed from left leg  . TONSILLECTOMY    . TUBAL LIGATION    . WISDOM TOOTH EXTRACTION      There were no vitals filed for this visit.  Subjective Assessment - 07/10/19 1537    Subjective  "Can I leave by 4pm?" (pt "miscalculated" her time done and told her ride 4 pm pickup)    Currently in Pain?  Yes    Pain Score  5     Pain Location  Leg   thighs   Pain Orientation  Left;Right    Pain Descriptors / Indicators  Sore    Pain Type  Acute pain    Pain Onset  In the past 7 days    Pain Frequency  Intermittent            ADULT SLP TREATMENT - 07/10/19 1538      General Information    Behavior/Cognition  Alert;Cooperative;Pleasant mood      Treatment Provided   Treatment provided  Cognitive-Linquistic      Cognitive-Linquistic Treatment   Treatment focused on  Dysarthria    Skilled Treatment  Taught pt loud /a/ with volume in mid 80s dB. In wrod level respones pt maintained WNL volume 95% of the time and in sentences maintained WNL volume 88%. When pt was told loud /a/ would assist her in success in [place] she stated, "Well, maybe I won't have to come as long!" SLP told pt to perform loud /a/ 5 reps BID.       Assessment / Recommendations / Plan   Plan  Continue with current plan of care      Progression Toward Goals   Progression toward goals  Progressing toward goals       SLP Education - 07/10/19 1640    Education Details  loud /a/, using "strong voice" increases her volume to WNL, rationale for loud /a/ and for more diaphragmatic movement/recruitment    Person(s) Educated  Patient    Methods  Explanation;Demonstration;Verbal cues    Comprehension  Verbalized understanding;Returned demonstration;Verbal cues required;Need further instruction       SLP Short Term Goals - 07/10/19 1643      SLP SHORT TERM GOAL #1   Title  pt will produce loud /a/ or "hey!" with at least mid 80s dB average over three sessions    Time  4    Period  Weeks    Status  On-going      SLP SHORT TERM GOAL #2   Title  pt will maintain upper 60s dB volume in simple conversation of 5 mintues in 3 sessions    Time  4    Period  Weeks    Status  On-going      SLP SHORT TERM GOAL #3   Title  pt will keep a log of daily speech practice and maintain at least 80% practice frequency    Time  4    Period  Weeks    Status  On-going       SLP Long Term Goals - 07/10/19 1643      SLP LONG TERM GOAL #1   Title  pt will produce loud /a/ or "hey!" with at least low 90s dB average over three sessions    Time  8    Period  Weeks    Status  On-going      SLP LONG TERM GOAL #2   Title  pt  will use average low 70s dB speech in 10 minutes mod complex conversation over three sessions    Time  8    Period  Weeks    Status  On-going       Plan - 07/10/19 1641    Clinical Impression Statement  Pt presents today with hypophonic/dysarthric speech due to reduced breath support -softer than normal speech volume. She was taught loud /a/ to habitualize incr'd diaphragm usage to improve breath support and speech volume. Pt has ID'd swallow issues during prior screens. Due to this, pt may benefit from an objective swallow assessment (modified barium swallow or FEES) during this course of therapy. Pt would benefit from skilled ST targeting incr'd volume in conversation. She would like to have one other therapy d/c prior to beginning ST. SLP has infomed pt's PT and OT of this.    Speech Therapy Frequency  2x / week    Duration  --   8 weeks, or 17 total sessions   Treatment/Interventions  Functional tasks;SLP instruction and feedback;Compensatory strategies;Patient/family education;Internal/external aids;Cueing hierarchy;Environmental controls    Potential to Achieve Goals  Good    Potential Considerations  Cooperation/participation level   pt has been reluctant to begin ST in the past   Consulted and Agree with Plan of Care  Patient       Patient will benefit from skilled therapeutic intervention in order to improve the following deficits and impairments:   Dysarthria and anarthria  Dysphagia, unspecified type    Problem List Patient Active Problem List   Diagnosis Date Noted  . Cervical stenosis (uterine cervix) 02/14/2018  . Postmenopausal bleeding 02/14/2018  . LBBB (left bundle branch block) 12/25/2015  . PVCs (premature ventricular contractions) 12/25/2015  . Hyperlipidemia 12/25/2015  . Fibromyalgia 01/16/2014  . Cholelithiasis 04/25/2013  . Paralysis agitans ([place]) 01/15/2013  . Unspecified hereditary and idiopathic peripheral neuropathy 01/15/2013    [facility] ,[place],  CCC-SLP  07/10/2019, 4:43 PM  [facility] [address]  [place], [place], [address] Phone: [phone]   Fax:  [phone]   Name: Morgan [name] MRN: [id number] Date of Birth: June 20, 1951

## 2019-07-10 NOTE — Patient Instructions (Addendum)
Quads / HF, Supine    Lie near edge of bed, one leg bent, foot flat on bed (or sofa). Other leg hanging over edge, relaxed, thigh resting entirely on bed. Bend hanging knee backward keeping thigh in contact with bed.  You will want to find the position where you feel a gentle stretch and hold _30-60__ seconds.  Repeat _3__ times per session. Do _1-2__ sessions per day.  Copyright  VHI. All rights reserved.  Turning in Place: Solid Surface    Standing in place facing the counter., lead with head and turn slowly making quarter turns toward left.  When turning to the left, lead with the left foot, then the right.  Think about 3:00, 6:00, 9:00, 12:00 positions on the clock. Repeat __3__ times per session each direction. Do __1-2__ sessions per day. Copyright  VHI. All rights reserved.

## 2019-07-12 ENCOUNTER — Encounter: Payer: Self-pay | Admitting: Occupational Therapy

## 2019-07-12 ENCOUNTER — Encounter: Payer: Medicare Other | Admitting: Occupational Therapy

## 2019-07-12 ENCOUNTER — Ambulatory Visit: Payer: Medicare Other | Admitting: Physical Therapy

## 2019-07-12 ENCOUNTER — Ambulatory Visit: Payer: Medicare Other | Admitting: Occupational Therapy

## 2019-07-12 ENCOUNTER — Other Ambulatory Visit: Payer: Self-pay

## 2019-07-12 ENCOUNTER — Encounter: Payer: Self-pay | Admitting: Physical Therapy

## 2019-07-12 DIAGNOSIS — H353122 Nonexudative age-related macular degeneration, left eye, intermediate dry stage: Secondary | ICD-10-CM | POA: Diagnosis not present

## 2019-07-12 DIAGNOSIS — R2681 Unsteadiness on feet: Secondary | ICD-10-CM

## 2019-07-12 DIAGNOSIS — R293 Abnormal posture: Secondary | ICD-10-CM

## 2019-07-12 DIAGNOSIS — R41840 Attention and concentration deficit: Secondary | ICD-10-CM

## 2019-07-12 DIAGNOSIS — R2689 Other abnormalities of gait and mobility: Secondary | ICD-10-CM

## 2019-07-12 DIAGNOSIS — R278 Other lack of coordination: Secondary | ICD-10-CM

## 2019-07-12 DIAGNOSIS — H2513 Age-related nuclear cataract, bilateral: Secondary | ICD-10-CM | POA: Diagnosis not present

## 2019-07-12 DIAGNOSIS — R471 Dysarthria and anarthria: Secondary | ICD-10-CM | POA: Diagnosis not present

## 2019-07-12 DIAGNOSIS — R29818 Other symptoms and signs involving the nervous system: Secondary | ICD-10-CM

## 2019-07-12 DIAGNOSIS — H43813 Vitreous degeneration, bilateral: Secondary | ICD-10-CM | POA: Diagnosis not present

## 2019-07-12 DIAGNOSIS — H353212 Exudative age-related macular degeneration, right eye, with inactive choroidal neovascularization: Secondary | ICD-10-CM | POA: Diagnosis not present

## 2019-07-12 NOTE — Patient Instructions (Addendum)
 (  Exercise) Monday Tuesday Wednesday Thursday Friday Saturday Sunday    PWR! hands            Coordination    Exercises              PWR! sitting               PWR!     Standing                                                                                                 When cutting, use long "slices"  Put tip of knife ahead of meat on plate so that knife is angled.

## 2019-07-12 NOTE — Therapy (Signed)
[facility] [address] [place], [place], [address] Phone: [phone]   Fax:  [phone]  Physical Therapy Treatment/Discharge Summary  Patient Details  Name: Morgan [name] MRN: [id number] Date of Birth: 1951/05/13 Referring Provider (PT): [name], Wells Guiles   Encounter Date: 07/12/2019  PT End of Session - 07/12/19 2034    Visit Number  6    Number of Visits  9    Date for PT Re-Evaluation  08/18/19    Authorization Type  Medicare/BCBS Federal    PT Start Time  1318    PT Stop Time  1356    PT Time Calculation (min)  38 min    Activity Tolerance  Patient tolerated treatment well    Behavior During Therapy  [facility] for tasks assessed/performed       Past Medical History:  Diagnosis Date  . Anxiety   . Fibromyalgia   . Hypercholesteremia    controlled on medication  . Hyperlipidemia   . Hypertension    no current meds  . LBBB (left bundle branch block)   . Melanoma ([place])   . MVP (mitral valve prolapse)   . Osteopenia   . Parkinson disease ([place])   . SVD (spontaneous vaginal delivery)    x 2    Past Surgical History:  Procedure Laterality Date  . BLEPHAROPLASTY    . cold knife conization    . COLONOSCOPY     FH colon CA  . DILATATION & CURETTAGE/HYSTEROSCOPY WITH MYOSURE N/A 02/14/2018   Procedure: ATTEMPTED DILATATION & CURETTAGE/HYSTEROSCOPY WITH MYOSURE;  Surgeon: Christophe Louis, MD;  Location: [facility];  Service: Gynecology;  Laterality: N/A;  polyp  . melanoma cancer     removed from left leg  . TONSILLECTOMY    . TUBAL LIGATION    . WISDOM TOOTH EXTRACTION      There were no vitals filed for this visit.  Subjective Assessment - 07/12/19 1320    Subjective  Got my eyes dilated today.  Feel like it's affecting my balance.    Patient Stated Goals  Pt's goals for therapy are to slow down with my walking.    Currently in Pain?  No/denies                       [facility] Adult PT  Treatment/Exercise - 07/12/19 0001      Transfers   Transfers  Sit to Stand;Stand to Sit    Sit to Stand  7: Independent;Without upper extremity assist;From bed    Five time sit to stand comments   11.78    Stand to Sit  7: Independent;Without upper extremity assist;To bed      Ambulation/Gait   Ambulation/Gait  Yes    Ambulation/Gait Assistance  5: Supervision;4: Min guard   Pt more unsteady today due to eyes being dilated   Ambulation/Gait Assistance Details  Trialed use of rollator today, as pt has rollator at home and wants to know if rolator is appropriate.  With trial of rollator, PT provides cues for hand placement with transfers and for staying close within BOS of rollator with gait.    Ambulation Distance (Feet)  200 Feet   rollator, 100 ft x 2 with walking pole   Assistive device  --   walking pole, rollator   Ambulation Surface  Level;Indoor    Gait velocity  12.88 sec= 2.55 ft/sec    Gait Comments  Reviewed technique for turning from last visit, with pt  verbalize understanding.  Attempted activity today with walking pole, short distance gait, turns, carrying cones.  Pt needs min guard assist, due to eyes dilated.  Pt able to avoid crossing feet with turns during activity.      Standardized Balance Assessment   Standardized Balance Assessment  Timed Up and Go Test      Timed Up and Go Test   TUG  Normal TUG;Cognitive TUG    Normal TUG (seconds)  12.12    Cognitive TUG (seconds)  13.44      Self-Care   Self-Care  Other Self-Care Comments    Other Self-Care Comments   Reviewed fall prevention; discussed use of rollator when she is fatigued, more muscle pains from fibromyalgia or just having an off-balance day, otherwise, walking pole likely good option for gait stability.  Discussed plans for d/c this visit, with pt in agreement.  Discussed benefits of return PT screen or eval in 6-9 months.             PT Education - 07/12/19 2033    Education Details  Progress  towards goals, POC, plans for d/c    Person(s) Educated  Patient    Methods  Explanation    Comprehension  Verbalized understanding          PT Long Term Goals - 07/12/19 1323      PT LONG TERM GOAL #1   Title  Pt will be independent with HEP for improved functional mobility, balance, transfers.  Updated TARGET 4 weeks (07/19/2019-due to delayed start, pt out of town)    Time  4    Period  Weeks    Status  Achieved      PT LONG TERM GOAL #2   Title  Pt will demonstrate sit<>stand transfers with safe, appropriate technique, no posterior LOB, 5 of 5 trials, for improved transfer efficiency and safety.    Time  4    Period  Weeks    Status  Partially Met      PT LONG TERM GOAL #3   Title  Pt will improve Berg Balance score to at least 49/56 for decreased fall risk.    Time  4    Period  Weeks    Status  Achieved      PT LONG TERM GOAL #4   Title  Pt will verbalize understanding of fall prevention education in the home environment, including tips to reduce hastening of gait.    Time  4    Period  Weeks    Status  Achieved            Plan - 07/12/19 2035    Clinical Impression Statement  Assessed remaining STGs this visit, with pt meeting LTG 1 for HEP, LTG 4 for fall prevention.  LTG 2 for sit<>stand partially met, as pt is able to perform correctly during 5x sit<>stand test; however, she has tendency for uncontrolled descent into sitting other times during session.  Pt has been given updates to HEP to addres balance and flexibility and safety with trasnfers.  She is appropriate for d/c at this time.    Personal Factors and Comorbidities  Comorbidity 3+   PD as neurodegenerative disease   Comorbidities  See PMH    Examination-Activity Limitations  Locomotion Level;Transfers    Examination-Participation Restrictions  Community Activity   Travel   Stability/Clinical Decision Making  Evolving/Moderate complexity    Rehab Potential  Good    PT Frequency  2x / week  PT  Duration  4 weeks   per recert 33/38/3291   PT Treatment/Interventions  ADLs/Self Care Home Management;Gait training;Functional mobility training;Therapeutic activities;Therapeutic exercise;Balance training;Neuromuscular re-education;Patient/family education    PT Next Visit Plan  D/C this visit.  Recommend return PT screen or eval in 6 months    Consulted and Agree with Plan of Care  Patient       Patient will benefit from skilled therapeutic intervention in order to improve the following deficits and impairments:  Abnormal gait, Decreased balance, Decreased mobility, Decreased strength, Difficulty walking, Postural dysfunction  Visit Diagnosis: Unsteadiness on feet  Other abnormalities of gait and mobility     Problem List Patient Active Problem List   Diagnosis Date Noted  . Cervical stenosis (uterine cervix) 02/14/2018  . Postmenopausal bleeding 02/14/2018  . LBBB (left bundle branch block) 12/25/2015  . PVCs (premature ventricular contractions) 12/25/2015  . Hyperlipidemia 12/25/2015  . Fibromyalgia 01/16/2014  . Cholelithiasis 04/25/2013  . Paralysis agitans ([place]) 01/15/2013  . Unspecified hereditary and idiopathic peripheral neuropathy 01/15/2013    MARRIOTT,AMY W. 07/12/2019, 8:40 PM  Frazier Butt., PT   [facility] [address] [place], [place], [address] Phone: [phone]   Fax:  [phone]  Name: Morgan [name] MRN: [id number] Date of Birth: 1950-11-17   PHYSICAL THERAPY DISCHARGE SUMMARY  Visits from Start of Care: 6  Current functional level related to goals / functional outcomes: See goals noted above   Remaining deficits: Balance, pain (chronic)   Education / Equipment: Educated in HEP, fall prevention     Plan: Patient agrees to discharge.  Patient goals were partially met. Patient is being discharged due to being pleased with the current functional level.  ?????Recommend return  PT screen or eval in 6 months due to progressive nature of disease process.         [name], PT 07/12/19 8:43 PM Phone: [phone] Fax: [phone]

## 2019-07-12 NOTE — Therapy (Signed)
[facility] [address] [place], [place], [address] Phone: [phone]   Fax:  [phone]  Occupational Therapy Treatment  Patient Details  Name: Morgan [name] MRN: [id number] Date of Birth: [birth year] Referring Provider (OT): Dr. Wells Guiles [name]   Encounter Date: 07/12/2019  OT End of Session - 07/12/19 1238    Visit Number  4    Number of Visits  13    Date for OT Re-Evaluation  07/15/19   delayed start at pt request due to vacation   Authorization Type  Medicare / BCBS    Authorization Time Period  05/16/19-08/14/19    Authorization - Visit Number  4    Authorization - Number of Visits  10    OT Start Time  6962    OT Stop Time  1315    OT Time Calculation (min)  41 min    Activity Tolerance  Patient tolerated treatment well    Behavior During Therapy  [facility] for tasks assessed/performed       Past Medical History:  Diagnosis Date  . Anxiety   . Fibromyalgia   . Hypercholesteremia    controlled on medication  . Hyperlipidemia   . Hypertension    no current meds  . LBBB (left bundle branch block)   . Melanoma ([place])   . MVP (mitral valve prolapse)   . Osteopenia   . Parkinson disease ([place])   . SVD (spontaneous vaginal delivery)    x 2    Past Surgical History:  Procedure Laterality Date  . BLEPHAROPLASTY    . cold knife conization    . COLONOSCOPY     FH colon CA  . DILATATION & CURETTAGE/HYSTEROSCOPY WITH MYOSURE N/A 02/14/2018   Procedure: ATTEMPTED DILATATION & CURETTAGE/HYSTEROSCOPY WITH MYOSURE;  Surgeon: Christophe Louis, MD;  Location: [facility];  Service: Gynecology;  Laterality: N/A;  polyp  . melanoma cancer     removed from left leg  . TONSILLECTOMY    . TUBAL LIGATION    . WISDOM TOOTH EXTRACTION      There were no vitals filed for this visit.  Subjective Assessment - 07/12/19 1236    Subjective   Pt reports hurting in arms/legs, not seeing or walking as well  (just got eyes dialated at the  eye doctor).  Today's a bad day.  Pt reports that she is ready for OT d/c and has just started speech therapy.    Pertinent History  Parkinson's Disease.  PMH:  anxiety, Fibromyalgia, HLD, HTN, L bundle branch block, Melanoma, mitral valve prolapse, osteopenia    Limitations  fall risk    Patient Stated Goals  be able to don L sock, improve balance    Currently in Pain?  Yes    Pain Score  5     Pain Location  --   arms/legs in general   Pain Orientation  Right;Left    Pain Descriptors / Indicators  Aching;Sore    Pain Type  Acute pain    Pain Onset  Today    Pain Frequency  Intermittent    Aggravating Factors   unknown    Pain Relieving Factors  unknown           Checked goals and discussed progress and need to continue HEP to decr risk for future complications.--see below for status.  Pt educated in strategies for cutting food (see pt instructions).  Practiced simulated cutting with fork/knife and putty.  Pt returned demo strategy with incr  ease with min cueing.  Verbally reviewed strategy for donning/doffing shirt and importance of keeping feet apart and on ground for standing ADLs/IADLs to decr fall risk.  In standing, functional reaching with each UE with trunk rotation, lateral and forward/backward wt. Shifts with reaching overhead and low for cylinder objects with min cueing for feet apart, wt. Shift, and to open hand fully for grasp.  Pt also would lift RLE when reaching laterally--cued pt to keep both feet on the floor to decr risk of falling.   Picking up small pegs and manipulating in hand to place in medication organizer for incr coordination with each hand.  Min cueing for posture.           OT Education - 07/12/19 1959    Education Details  PD Exercise Chart to incr consistency with HEP    Person(s) Educated  Patient    Methods  Explanation;Handout    Comprehension  Verbalized understanding          OT Long Term Goals - 07/12/19 1258      OT LONG TERM  GOAL #1   Title  Pt will verbalize understanding of adaptive strategies/AE to incr ease/safety with ADLs/IADLs (including balance for dressing/standing ADLs, donning/doffing shirt, cutting food).--check LTGs 06/29/19    Time  5    Period  Weeks    Status  Achieved      OT LONG TERM GOAL #2   Title  Pt will improve balance for ADLs/IADLs as shown by demonstrating at least 10.5" functional reach with LUE.    Baseline  L-9"    Time  5    Period  Weeks    Status  Achieved   07/12/19:  L-11"     OT LONG TERM GOAL #3   Title  Pt will improve coordination for ADLs as shown by improving time on 9-hole peg test by at least 7sec with LUE.    Time  5    Period  Weeks    Status  Achieved   07/12/19:  23.65sec     OT LONG TERM GOAL #4   Title  Pt will be able to don socks mod I using AE/strategies.    Baseline  difficulty with LLE    Time  5    Period  Weeks    Status  Achieved      OT LONG TERM GOAL #5   Title  Pt will be independent with updated PD-specific HEP.    Baseline  --    Time  5    Period  Weeks    Status  Achieved            Plan - 07/12/19 1238    OT Occupational Profile and History  Comprehensive Assessment- Review of records and extensive additional review of physical, cognitive, psychosocial history related to current functional performance    Occupational performance deficits (Please refer to evaluation for details):  ADL's;IADL's;Leisure;Social Participation    Body Structure / Function / Physical Skills  ADL;Balance;Mobility;UE functional use;FMC;Coordination;GMC;IADL;Dexterity;Decreased knowledge of use of DME    Cognitive Skills  Attention;Memory;Safety Awareness;Thought    Rehab Potential  Good    Clinical Decision Making  Several treatment options, min-mod task modification necessary    Comorbidities Affecting Occupational Performance:  Presence of comorbidities impacting occupational performance    Modification or Assistance to Complete Evaluation    Min-Moderate modification of tasks or assist with assess necessary to complete eval    OT Frequency  2x /  week    OT Duration  --   for 5 weeks (or 10 visits) +eval   OT Treatment/Interventions  Self-care/ADL training;Therapeutic exercise;Functional Mobility Training;Balance training;Neuromuscular education;Manual Therapy;Therapeutic activities;Energy conservation;Cryotherapy;Paraffin;DME and/or AE instruction;Cognitive remediation/compensation;Passive range of motion;Patient/family education;Moist Heat    Consulted and Agree with Plan of Care  Patient       Patient will benefit from skilled therapeutic intervention in order to improve the following deficits and impairments:   Body Structure / Function / Physical Skills: ADL, Balance, Mobility, UE functional use, FMC, Coordination, GMC, IADL, Dexterity, Decreased knowledge of use of DME Cognitive Skills: Attention, Memory, Safety Awareness, Thought     Visit Diagnosis: Other symptoms and signs involving the nervous system  Other lack of coordination  Attention and concentration deficit  Unsteadiness on feet  Abnormal posture  Other abnormalities of gait and mobility    Problem List Patient Active Problem List   Diagnosis Date Noted  . Cervical stenosis (uterine cervix) 02/14/2018  . Postmenopausal bleeding 02/14/2018  . LBBB (left bundle branch block) 12/25/2015  . PVCs (premature ventricular contractions) 12/25/2015  . Hyperlipidemia 12/25/2015  . Fibromyalgia 01/16/2014  . Cholelithiasis 04/25/2013  . Paralysis agitans ([place]) 01/15/2013  . Unspecified hereditary and idiopathic peripheral neuropathy 01/15/2013    OCCUPATIONAL THERAPY DISCHARGE SUMMARY  Visits from Start of Care: 4  Current functional level related to goals / functional outcomes: See above   Remaining deficits: Pt continues to demo decr coordination (although improved), decr balance/functional mobility, decr posture, cognitive deficits,  hypokinesia   Education / Equipment: Pt was instructed in the following:  Updated PD-specific HEP, updated strategies for ADLs/IADLs, reviewed ways to prevent future complications.  Pt verbalized understanding of all education provided.    Plan: Patient agrees to discharge.  Patient goals were met. Patient is being discharged due to being pleased with the current functional level. for OT.  Pt would benefit from re-evaluation/occupational therapy screen in approx 6 months to assess for need for further therapy/functional changes due to progressive nature of diagnosis.  ?????       [facility] 07/12/2019, 8:03 PM  [facility] [address] [place], [place], [address] Phone: [phone]   Fax:  [phone]  Name: Morgan [name] MRN: [id number] Date of Birth: 1950/09/03   Vianne Bulls, OTR/L [facility] [address] [place], [place]  [address] [phone] phone [phone] 07/12/19 8:10 PM

## 2019-07-16 ENCOUNTER — Ambulatory Visit: Payer: Medicare Other | Admitting: Speech Pathology

## 2019-07-16 ENCOUNTER — Other Ambulatory Visit: Payer: Self-pay

## 2019-07-16 DIAGNOSIS — R293 Abnormal posture: Secondary | ICD-10-CM | POA: Diagnosis not present

## 2019-07-16 DIAGNOSIS — R41840 Attention and concentration deficit: Secondary | ICD-10-CM | POA: Diagnosis not present

## 2019-07-16 DIAGNOSIS — R278 Other lack of coordination: Secondary | ICD-10-CM | POA: Diagnosis not present

## 2019-07-16 DIAGNOSIS — R2689 Other abnormalities of gait and mobility: Secondary | ICD-10-CM | POA: Diagnosis not present

## 2019-07-16 DIAGNOSIS — R471 Dysarthria and anarthria: Secondary | ICD-10-CM

## 2019-07-16 DIAGNOSIS — R29818 Other symptoms and signs involving the nervous system: Secondary | ICD-10-CM | POA: Diagnosis not present

## 2019-07-16 NOTE — Therapy (Signed)
[facility] [address] [place], [place], [address] Phone: [phone]   Fax:  [phone]  Speech Language Pathology Treatment  Patient Details  Name: Morgan [name] MRN: [id number] Date of Birth: [birth year] Referring Provider (SLP): Alonza Bogus, MD   Encounter Date: 07/16/2019  End of Session - 07/16/19 1319    Visit Number  3    Number of Visits  17    Date for SLP Re-Evaluation  09/26/19    SLP Start Time  1150    SLP Stop Time   1230    SLP Time Calculation (min)  40 min    Activity Tolerance  Patient tolerated treatment well       Past Medical History:  Diagnosis Date  . Anxiety   . Fibromyalgia   . Hypercholesteremia    controlled on medication  . Hyperlipidemia   . Hypertension    no current meds  . LBBB (left bundle branch block)   . Melanoma ([place])   . MVP (mitral valve prolapse)   . Osteopenia   . Parkinson disease ([place])   . SVD (spontaneous vaginal delivery)    x 2    Past Surgical History:  Procedure Laterality Date  . BLEPHAROPLASTY    . cold knife conization    . COLONOSCOPY     FH colon CA  . DILATATION & CURETTAGE/HYSTEROSCOPY WITH MYOSURE N/A 02/14/2018   Procedure: ATTEMPTED DILATATION & CURETTAGE/HYSTEROSCOPY WITH MYOSURE;  Surgeon: Christophe Louis, MD;  Location: [facility];  Service: Gynecology;  Laterality: N/A;  polyp  . melanoma cancer     removed from left leg  . TONSILLECTOMY    . TUBAL LIGATION    . WISDOM TOOTH EXTRACTION      There were no vitals filed for this visit.  Subjective Assessment - 07/16/19 1153    Subjective  "This morning my... physical therapist did a number on me."    Currently in Pain?  Yes    Pain Score  6     Pain Location  Leg    Pain Orientation  Upper;Right;Left    Pain Descriptors / Indicators  Aching;Sore            ADULT SLP TREATMENT - 07/16/19 1155      General Information   Behavior/Cognition  Alert;Cooperative;Pleasant mood       Treatment Provided   Treatment provided  Cognitive-Linquistic      Pain Assessment   Pain Assessment  No/denies pain      Cognitive-Linquistic Treatment   Treatment focused on  Dysarthria    Skilled Treatment  Pt told SLP "I can only do (loud /a/) when my husband isn't home." Reports she has been able to do 10x each day. Loud /a/ averaged 86 dB at 30 cm distance; SLP had pt use "Hey!" prior to /a/ for more appropriate pitch. Maintained average 74 dB in phrases, averaged low 70s dB generating sentences with cognitive load and occasional min-mod cues. Initially usual cues needed for carryover into spontaneous responses, however as session progressed pt more independent with this (min cues rarely). In brief simple conversation occasional min-mod A to maintain intensity.      Assessment / Recommendations / Plan   Plan  Continue with current plan of care      Progression Toward Goals   Progression toward goals  Progressing toward goals         SLP Short Term Goals - 07/16/19 1319      SLP  SHORT TERM GOAL #1   Title  pt will produce loud /a/ or "hey!" with at least mid 80s dB average over three sessions    Time  3    Period  Weeks    Status  On-going      SLP SHORT TERM GOAL #2   Title  pt will maintain upper 60s dB volume in simple conversation of 5 mintues in 3 sessions    Time  3    Period  Weeks    Status  On-going      SLP SHORT TERM GOAL #3   Title  pt will keep a log of daily speech practice and maintain at least 80% practice frequency    Time  3    Period  Weeks    Status  On-going       SLP Long Term Goals - 07/16/19 1319      SLP LONG TERM GOAL #1   Title  pt will produce loud /a/ or "hey!" with at least low 90s dB average over three sessions    Time  7    Period  Weeks    Status  On-going      SLP LONG TERM GOAL #2   Title  pt will use average low 70s dB speech in 10 minutes mod complex conversation over three sessions    Time  7    Period  Weeks     Status  On-going       Plan - 07/16/19 1319    Clinical Impression Statement  Pt presents today with hypophonic/dysarthric speech due to reduced breath support -softer than normal speech volume. She was taught loud /a/ to habitualize incr'd diaphragm usage to improve breath support and speech volume. Pt has ID'd swallow issues during prior screens. Due to this, pt may benefit from an objective swallow assessment (modified barium swallow or FEES) during this course of therapy. Pt would benefit from skilled ST targeting incr'd volume in conversation. She would like to have one other therapy d/c prior to beginning ST. SLP has infomed pt's PT and OT of this.    Speech Therapy Frequency  2x / week    Duration  --   8 weeks, or 17 total sessions   Treatment/Interventions  Functional tasks;SLP instruction and feedback;Compensatory strategies;Patient/family education;Internal/external aids;Cueing hierarchy;Environmental controls    Potential to Achieve Goals  Good    Potential Considerations  Cooperation/participation level   pt has been reluctant to begin ST in the past   Consulted and Agree with Plan of Care  Patient       Patient will benefit from skilled therapeutic intervention in order to improve the following deficits and impairments:   Dysarthria and anarthria    Problem List Patient Active Problem List   Diagnosis Date Noted  . Cervical stenosis (uterine cervix) 02/14/2018  . Postmenopausal bleeding 02/14/2018  . LBBB (left bundle branch block) 12/25/2015  . PVCs (premature ventricular contractions) 12/25/2015  . Hyperlipidemia 12/25/2015  . Fibromyalgia 01/16/2014  . Cholelithiasis 04/25/2013  . Paralysis agitans ([place]) 01/15/2013  . Unspecified hereditary and idiopathic peripheral neuropathy 01/15/2013   Deneise Lever, [place], CCC-SLP Speech-Language Pathologist  Aliene Altes 07/16/2019, 1:20 PM  [facility] [address] [place], [place], [address] Phone: [phone]   Fax:  [phone]   Name: Cortasia Khouri MRN: [id number] Date of Birth: 1951-03-31

## 2019-07-30 ENCOUNTER — Encounter: Payer: Self-pay | Admitting: Cardiovascular Disease

## 2019-07-30 ENCOUNTER — Ambulatory Visit (INDEPENDENT_AMBULATORY_CARE_PROVIDER_SITE_OTHER): Payer: Medicare Other | Admitting: Cardiovascular Disease

## 2019-07-30 ENCOUNTER — Other Ambulatory Visit: Payer: Self-pay

## 2019-07-30 VITALS — BP 152/94 | HR 105 | Ht 65.0 in | Wt 167.0 lb

## 2019-07-30 DIAGNOSIS — I447 Left bundle-branch block, unspecified: Secondary | ICD-10-CM

## 2019-07-30 NOTE — Progress Notes (Signed)
Patient ID: Morgan [name], female   DOB: 19-Aug-1951, 68 y.o.   MRN: [id number]    Cardiology Office Note    Date:  08/01/2019   ID:  Morgan [name], DOB 1950/09/19, MRN [id number]  PCP:  Morgan Ada, MD  Cardiologist:   Morgan Klein, MD   No chief complaint on file.   History of Present Illness:  Morgan [name] is a 68 y.o. female with left bundle branch block and irregular heart rhythm.  Her mother-in-law Morgan [name] is also my patient.  She has no cardiac complaints.  Her back hurts every time she undergoes physical therapy and she thinks this is why her blood pressure is high today.  Otherwise at home her blood pressure is well controlled.  On 06/19/2019 her blood pressure was 09/27/1987 at a doctor's appointment.  She has Parkinson's syndrome and has had occasional problems with symptoms of orthostatic hypotension, but this is infrequent.  She is always careful to use a walker and is very cautious to avoid falls.  The patient specifically denies any chest pain at rest exertion, dyspnea at rest or with exertion, orthopnea, paroxysmal nocturnal dyspnea, syncope, palpitations, focal neurological deficits, intermittent claudication, lower extremity edema, unexplained weight gain, cough, hemoptysis or wheezing.  She does not have a history of structural heart disease but does have mild hypertension and hyperlipidemia both adequately treated with pharmacological means. Her most serious medical problem is Parkinson's disease which limits her mobility.    Past Medical History:  Diagnosis Date  . Anxiety   . Fibromyalgia   . Hypercholesteremia    controlled on medication  . Hyperlipidemia   . Hypertension    no current meds  . LBBB (left bundle branch block)   . Melanoma ([place])   . MVP (mitral valve prolapse)   . Osteopenia   . Parkinson disease ([place])   . SVD (spontaneous vaginal delivery)    x 2    Past Surgical History:  Procedure Laterality Date  . BLEPHAROPLASTY     . cold knife conization    . COLONOSCOPY     FH colon CA  . DILATATION & CURETTAGE/HYSTEROSCOPY WITH MYOSURE N/A 02/14/2018   Procedure: ATTEMPTED DILATATION & CURETTAGE/HYSTEROSCOPY WITH MYOSURE;  Surgeon: Morgan Louis, MD;  Location: [facility];  Service: Gynecology;  Laterality: N/A;  polyp  . melanoma cancer     removed from left leg  . TONSILLECTOMY    . TUBAL LIGATION    . WISDOM TOOTH EXTRACTION      Current Medications: Outpatient Medications Prior to Visit  Medication Sig Dispense Refill  . ALPRAZolam (XANAX) 0.25 MG tablet Take 0.25 mg by mouth daily as needed for anxiety.     Marland Kitchen aspirin 325 MG tablet Take 325-650 mg by mouth daily as needed (for headaches/pain.).     Marland Kitchen atorvastatin (LIPITOR) 10 MG tablet Take 10 mg by mouth at bedtime.    Marland Kitchen CALCIUM CITRATE PO Take 2 tablets by mouth daily. BariMelts Calcium Citrate    . carbidopa-levodopa (SINEMET IR) 25-100 MG tablet TAKE 2 TABLETS 3 TIMES A   DAY AS DIRECTED 540 tablet 1  . Cholecalciferol (VITAMIN D-3) 5000 units TABS Take 5,000 Units by mouth 2 (two) times daily.    . clonazePAM (KLONOPIN) 1 MG tablet TAKE 1 TABLET BY MOUTH EVERYDAY AT BEDTIME 90 tablet 0  . Coenzyme Q10-Vitamin E (QUNOL ULTRA COQ10 PO) Take 1 capsule by mouth daily.    . CYMBALTA 60 MG capsule TAKE 1 CAPSULE DAILY 90 capsule  1  . gabapentin (NEURONTIN) 400 MG capsule TAKE 1 CAPSULE 3 TIMES A   DAY 270 capsule 3  . Magnesium 400 MG TABS Take 400 mg by mouth at bedtime.    . Multiple Vitamins-Minerals (PRESERVISION AREDS 2 PO) Take 1 tablet by mouth 2 (two) times daily.    . Polyethylene Glycol 400 (BLINK TEARS) 0.25 % SOLN Place 1 drop into both eyes 3 (three) times daily as needed (for dry/irritated eyes.).    Marland Kitchen Pramipexole Dihydrochloride 0.75 MG TB24 TAKE 1 TABLET DAILY 90 tablet 1  . raloxifene (EVISTA) 60 MG tablet Take 60 mg by mouth daily.    . Selenium 100 MCG CAPS Take by mouth.    Marland Kitchen amantadine (SYMMETREL) 100 MG capsule Take 1 capsule (100 mg total)  by mouth 2 (two) times daily. (Patient not taking: Reported on 07/30/2019) 180 capsule 1   No facility-administered medications prior to visit.      Allergies:   Other, Phenergan [promethazine hcl], Sporanox [itraconazole], and Tylenol [acetaminophen]   Social History   Socioeconomic History  . Marital status: Married    Spouse name: Not on file  . Number of children: 2  . Years of education: Not on file  . Highest education level: Bachelor's degree (e.g., BA, AB, BS)  Occupational History  . 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 Financial resource strain: Not on file  . Food insecurity    Worry: Not on file    Inability: Not on file  . Transportation needs    Medical: Not on file    Non-medical: Not on file  Tobacco Use  . Smoking status: Former Smoker    Packs/day: 0.25    Types: Cigarettes    Quit date: 09/29/1997    Years since quitting: 21.8  . Smokeless tobacco: Never Used  . Tobacco comment: chews [place], 10-12/day  Substance and Sexual Activity  . Alcohol use: Yes    Comment: Occasional wine  . Drug use: No  . Sexual activity: Not on file  Lifestyle  . Physical activity    Days per week: Not on file    Minutes per session: Not on file  . Stress: Not on file  Relationships  . Social Herbalist on phone: Not on file    Gets together: Not on file    Attends religious service: Not on file    Active member of club or organization: Not on file    Attends meetings of clubs or organizations: Not on file    Relationship status: Not on file  Other Topics Concern  . Not on file  Social History Narrative  . Not on file     Family History:  The patient's family history includes Cancer in her mother; Healthy in her brother, child, and sister.   ROS:   Please see the history of present illness.    ROS All other systems are reviewed and are negative.   PHYSICAL EXAM:   VS:  BP (!) 152/94   Pulse (!) 105   Ht  5\' 5"  (1.651 m)   Wt 167 lb (75.8 kg)   SpO2 96%   BMI 27.79 kg/m     General: Alert, oriented x3, no distress, overweight Head: no evidence of trauma, PERRL, EOMI, no exophtalmos or lid lag, no myxedema, no xanthelasma; normal ears, nose and oropharynx Neck: normal jugular venous pulsations and no hepatojugular reflux; brisk carotid pulses without delay  and no carotid bruits Chest: clear to auscultation, no signs of consolidation by percussion or palpation, normal fremitus, symmetrical and full respiratory excursions Cardiovascular: normal position and quality of the apical impulse, regular rhythm, normal first and paradoxically split second heart sounds, no murmurs, rubs or gallops Abdomen: no tenderness or distention, no masses by palpation, no abnormal pulsatility or arterial bruits, normal bowel sounds, no hepatosplenomegaly Extremities: no clubbing, cyanosis or edema; 2+ radial, ulnar and brachial pulses bilaterally; 2+ right femoral, posterior tibial and dorsalis pedis pulses; 2+ left femoral, posterior tibial and dorsalis pedis pulses; no subclavian or femoral bruits Neurological: grossly nonfocal Psych: Normal mood and affect   Wt Readings from Last 3 Encounters:  07/30/19 167 lb (75.8 kg)  04/16/19 172 lb 6.4 oz (78.2 kg)  11/06/18 175 lb (79.4 kg)      Studies/Labs Reviewed:   EKG:  EKG is ordered today.  It shows normal sinus rhythm, left bundle branch block with extreme left axis deviation Recent Labs: No results found for requested labs within last 8760 hours.  10/04/2018 hemoglobin 12.9, creatinine 0.4, potassium 3.8, normal liver function tests, TSH 1.93  Lipid Panel    Component Value Date/Time   CHOL  09/04/2008 0105    155        ATP III CLASSIFICATION:  <200     mg/dL   Desirable  200-239  mg/dL   Borderline High  >=240    mg/dL   High          TRIG 175 (H) 09/04/2008 0105   HDL 40 09/04/2008 0105   CHOLHDL 3.9 09/04/2008 0105   VLDL 35 09/04/2008 0105    LDLCALC  09/04/2008 0105    80        Total Cholesterol/HDL:CHD Risk Coronary Heart Disease Risk Table                     Men   Women  1/2 Average Risk   3.4   3.3  Average Risk       5.0   4.4  2 X Average Risk   9.6   7.1  3 X Average Risk  23.4   11.0        Use the calculated Patient Ratio above and the CHD Risk Table to determine the patient's CHD Risk.        ATP III CLASSIFICATION (LDL):  <100     mg/dL   Optimal  100-129  mg/dL   Near or Above                    Optimal  130-159  mg/dL   Borderline  160-189  mg/dL   High  >190     mg/dL   Very High  10/04/2018 total cholesterol 190, HDL 42, LDL 101, triglycerides 228  Additional studies/ records that were reviewed today include:  Records from PCP   ASSESSMENT:    1. LBBB (left bundle branch block)      PLAN:  In order of problems listed above:  1. LBBB: Previous work-up shows no evidence of structural heart disease.  She does not have any symptoms to suggest that she has episodes of high-grade AV block.  Discussed potential future need for pacemaker, but this may be many years away or may never be necessary.  Offered to  increase the interval between follow-ups to 2 years if she is doing well.  Elevated blood pressure today is situational.  Medication Adjustments/Labs and Tests Ordered: Current medicines are reviewed at length with the patient today.  Concerns regarding medicines are outlined above.  Medication changes, Labs and Tests ordered today are listed in the Patient Instructions below. Patient Instructions  Medication Instructions:  No changes *If you need a refill on your cardiac medications before your next appointment, please call your pharmacy*  Lab Work: None ordered If you have labs (blood work) drawn today and your tests are completely normal, you will receive your results only by: Marland Kitchen MyChart Message (if you have MyChart) OR . A paper copy in the mail If you have any lab test that is  abnormal or we need to change your treatment, we will call you to review the results.  Testing/Procedures: None ordered  Follow-Up: At [facility], you and your health needs are our priority.  As part of our continuing mission to provide you with exceptional heart care, we have created designated Provider Care Teams.  These Care Teams include your primary Cardiologist (physician) and Advanced Practice Providers (APPs -  Physician Assistants and Nurse Practitioners) who all work together to provide you with the care you need, when you need it.  Your next appointment:   12 month(s)  The format for your next appointment:   In Person  Provider:   Sanda Klein, MD     Signed, Morgan Klein, MD  08/01/2019 6:41 PM    [facility] [place], [place], [place]  [address] Phone: [phone]; Fax: [phone]

## 2019-07-30 NOTE — Patient Instructions (Signed)

## 2019-07-31 ENCOUNTER — Ambulatory Visit: Payer: Medicare Other | Attending: Neurology

## 2019-07-31 DIAGNOSIS — R1310 Dysphagia, unspecified: Secondary | ICD-10-CM

## 2019-07-31 DIAGNOSIS — R471 Dysarthria and anarthria: Secondary | ICD-10-CM

## 2019-08-01 NOTE — Therapy (Signed)
[facility] [address] [place], [place], [address] Phone: [phone]   Fax:  [phone]  Speech Language Pathology Treatment  Patient Details  Name: Morgan [name] MRN: [id number] Date of Birth: November 14, 1950 Referring Provider (SLP): Alonza Bogus, MD   Encounter Date: 07/31/2019  End of Session - 08/01/19 1553    Visit Number  4    Number of Visits  17    Date for SLP Re-Evaluation  09/26/19    SLP Start Time  [id number]    SLP Stop Time   1445    SLP Time Calculation (min)  41 min    Activity Tolerance  Patient tolerated treatment well       Past Medical History:  Diagnosis Date  . Anxiety   . Fibromyalgia   . Hypercholesteremia    controlled on medication  . Hyperlipidemia   . Hypertension    no current meds  . LBBB (left bundle branch block)   . Melanoma ([place])   . MVP (mitral valve prolapse)   . Osteopenia   . Parkinson disease ([place])   . SVD (spontaneous vaginal delivery)    x 2    Past Surgical History:  Procedure Laterality Date  . BLEPHAROPLASTY    . cold knife conization    . COLONOSCOPY     FH colon CA  . DILATATION & CURETTAGE/HYSTEROSCOPY WITH MYOSURE N/A 02/14/2018   Procedure: ATTEMPTED DILATATION & CURETTAGE/HYSTEROSCOPY WITH MYOSURE;  Surgeon: Christophe Louis, MD;  Location: [facility];  Service: Gynecology;  Laterality: N/A;  polyp  . melanoma cancer     removed from left leg  . TONSILLECTOMY    . TUBAL LIGATION    . WISDOM TOOTH EXTRACTION      There were no vitals filed for this visit.  Subjective Assessment - 07/31/19 1411    Subjective  "I would like to go (with ST) until the week of December 14th, and maybe come back after I get back from vacation." (pt going to [facility] 09-01-19)    Currently in Pain?  Yes    Pain Score  2     Pain Orientation  Right;Left;Upper            ADULT SLP TREATMENT - 08/01/19 0001      General Information   Behavior/Cognition  Alert;Cooperative;Pleasant  mood      Treatment Provided   Treatment provided  Cognitive-Linquistic      Cognitive-Linquistic Treatment   Treatment focused on  Dysarthria    Skilled Treatment  Loud "HEY" at mid-upper 80s dB x6, then SLP had pt perform loud /a/ - average with 3 reps (due to COVID-19 risk) instead of 5 was at average mid 80s dB with occasional min A for loudness. In simple "wh" question responses, pt averaged low 70s dB 80% of the time, incr'd to 100% with cues to repeat. Pt with minimal carryover in conversation between stimuli and in conversation prior to and after structured tasks. SLP highlighted that her goal was to be louder in conversation with people and she is not at that level yet. Originally pt wanted to be d/c'd after the week of 08-12-19 however SLP explained pt will not have had enough therapy to be successful in conversation by that time. Pt then stated "s" statement. Further discussion will be had on pt's plan for ST course.      Assessment / Recommendations / Plan   Plan  Continue with current plan of care  Progression Toward Goals   Progression toward goals  Progressing toward goals       SLP Education - 08/01/19 1553    Education Details  need to come longer than 2 more weeks to meet her goal    Person(s) Educated  Patient    Methods  Explanation    Comprehension  Verbalized understanding       SLP Short Term Goals - 08/01/19 1554      SLP SHORT TERM GOAL #1   Title  pt will produce loud /a/ or "hey!" with at least mid 80s dB average over three sessions    Time  2    Period  Weeks    Status  On-going      SLP SHORT TERM GOAL #2   Title  pt will maintain upper 60s dB volume in simple conversation of 5 mintues in 3 sessions    Time  2    Period  Weeks    Status  On-going      SLP SHORT TERM GOAL #3   Title  pt will keep a log of daily speech practice and maintain at least 80% practice frequency    Time  2    Period  Weeks    Status  On-going       SLP Long Term  Goals - 08/01/19 1555      SLP LONG TERM GOAL #1   Title  pt will produce loud /a/ or "hey!" with at least low 90s dB average over three sessions    Time  6    Period  Weeks    Status  On-going      SLP LONG TERM GOAL #2   Title  pt will use average low 70s dB speech in 10 minutes mod complex conversation over three sessions    Time  6    Period  Weeks    Status  On-going       Plan - 08/01/19 1553    Clinical Impression Statement  Pt continues with  hypophonic/dysarthric speech due to reduced breath support resulting in softer than normal speech volume. She was told today that if she wanted to d/c in two weeks she will not have habituated WNL loud speech. Pt has ID'd swallow issues during prior screens. Due to this, pt may benefit from an objective swallow assessment (modified barium swallow or FEES) during this course of therapy. Pt would benefit from skilled ST targeting incr'd volume in conversation. She would like to have one other therapy d/c prior to beginning ST. SLP has infomed pt's PT and OT of this.    Speech Therapy Frequency  2x / week    Duration  --   8 weeks, or 17 total sessions   Treatment/Interventions  Functional tasks;SLP instruction and feedback;Compensatory strategies;Patient/family education;Internal/external aids;Cueing hierarchy;Environmental controls    Potential to Achieve Goals  Good    Potential Considerations  Cooperation/participation level   pt has been reluctant to begin ST in the past   Consulted and Agree with Plan of Care  Patient       Patient will benefit from skilled therapeutic intervention in order to improve the following deficits and impairments:   Dysarthria and anarthria  Dysphagia, unspecified type    Problem List Patient Active Problem List   Diagnosis Date Noted  . Cervical stenosis (uterine cervix) 02/14/2018  . Postmenopausal bleeding 02/14/2018  . LBBB (left bundle branch block) 12/25/2015  . PVCs (premature ventricular  contractions) 12/25/2015  .  Hyperlipidemia 12/25/2015  . Fibromyalgia 01/16/2014  . Cholelithiasis 04/25/2013  . Paralysis agitans ([place]) 01/15/2013  . Unspecified hereditary and idiopathic peripheral neuropathy 01/15/2013    [facility] ,[place], CCC-SLP  08/01/2019, 3:55 PM  [facility] [address] [place], [place], [address] Phone: [phone]   Fax:  [phone]   Name: Morgan [name] MRN: [id number] Date of Birth: 1951/06/19

## 2019-08-02 ENCOUNTER — Ambulatory Visit: Payer: Medicare Other

## 2019-08-02 ENCOUNTER — Other Ambulatory Visit: Payer: Self-pay

## 2019-08-02 DIAGNOSIS — R471 Dysarthria and anarthria: Secondary | ICD-10-CM | POA: Diagnosis not present

## 2019-08-02 DIAGNOSIS — R1310 Dysphagia, unspecified: Secondary | ICD-10-CM

## 2019-08-02 NOTE — Patient Instructions (Signed)
  Please complete the assigned speech therapy homework prior to your next session and return it to the speech therapist at your next visit.  

## 2019-08-02 NOTE — Therapy (Signed)
[facility] [address] [place], [place], [address] Phone: [phone]   Fax:  [phone]  Speech Language Pathology Treatment  Patient Details  Name: Morgan [name] MRN: [id number] Date of Birth: [birth year] Referring Provider (SLP): Alonza Bogus, MD   Encounter Date: 08/02/2019  End of Session - 08/02/19 1150    Visit Number  5    Number of Visits  17    Date for SLP Re-Evaluation  09/26/19    SLP Start Time  1104    SLP Stop Time   1145    SLP Time Calculation (min)  41 min    Activity Tolerance  Patient tolerated treatment well       Past Medical History:  Diagnosis Date  . Anxiety   . Fibromyalgia   . Hypercholesteremia    controlled on medication  . Hyperlipidemia   . Hypertension    no current meds  . LBBB (left bundle branch block)   . Melanoma ([place])   . MVP (mitral valve prolapse)   . Osteopenia   . Parkinson disease ([place])   . SVD (spontaneous vaginal delivery)    x 2    Past Surgical History:  Procedure Laterality Date  . BLEPHAROPLASTY    . cold knife conization    . COLONOSCOPY     FH colon CA  . DILATATION & CURETTAGE/HYSTEROSCOPY WITH MYOSURE N/A 02/14/2018   Procedure: ATTEMPTED DILATATION & CURETTAGE/HYSTEROSCOPY WITH MYOSURE;  Surgeon: Christophe Louis, MD;  Location: [facility];  Service: Gynecology;  Laterality: N/A;  polyp  . melanoma cancer     removed from left leg  . TONSILLECTOMY    . TUBAL LIGATION    . WISDOM TOOTH EXTRACTION      There were no vitals filed for this visit.  Subjective Assessment - 08/02/19 1110    Currently in Pain?  Yes    Pain Location  Leg   quads   Pain Orientation  Right;Left    Pain Descriptors / Indicators  Aching;Sore    Pain Type  Chronic pain    Pain Onset  More than a month ago    Pain Frequency  Intermittent            ADULT SLP TREATMENT - 08/02/19 1111      General Information   Behavior/Cognition  Alert;Cooperative;Pleasant mood       Treatment Provided   Treatment provided  Cognitive-Linquistic      Cognitive-Linquistic Treatment   Treatment focused on  Dysarthria    Skilled Treatment  Loud "HEY" at mid-upper 80s dB x8, then SLP had pt perform loud /a/ - average with 2 reps (due to COVID-19 risk) instead of 5 was at average mid 80s dB independently. In 1-2 sentence responses with picture support pt req'd usual min cues due to loudness decay. Pt again with minimal carryover in conversation between stimuli and in conversation prior to and after structured tasks. SLP reiterated pt mid-senence, between therapy tasks that pt was not at a WNL loudness, and that being at a WNL loudness level during the session BETWEEN therapy tasks would meet the goal of habitually improving her loudness to WNL. Pt shared at the end of the session that she feels she can only do homework when her husband isn't home because "he doesn't think I need ST." SLP invited pt to ask her husband to come in for a ST session so husband can see what ST session consists of.  Assessment / Recommendations / Plan   Plan  Continue with current plan of care      Progression Toward Goals   Progression toward goals  Progressing toward goals         SLP Short Term Goals - 08/02/19 1151      SLP SHORT TERM GOAL #1   Title  pt will produce loud /a/ or "hey!" with at least mid 80s dB average over three sessions    Baseline  08-02-19    Time  2    Period  Weeks    Status  On-going      SLP SHORT TERM GOAL #2   Title  pt will maintain upper 60s dB volume in simple conversation of 5 mintues in 3 sessions    Time  2    Period  Weeks    Status  On-going      SLP SHORT TERM GOAL #3   Title  pt will keep a log of daily speech practice and maintain at least 80% practice frequency    Time  2    Period  Weeks    Status  On-going       SLP Long Term Goals - 08/02/19 [place] #1   Title  pt will produce loud /a/ or "hey!" with at least  low 90s dB average over three sessions    Time  6    Period  Weeks    Status  On-going      SLP LONG TERM GOAL #2   Title  pt will use average low 70s dB speech in 10 minutes mod complex conversation over three sessions    Time  6    Period  Weeks    Status  On-going       Plan - 08/02/19 1150    Clinical Impression Statement  Pt continues with  hypophonic/dysarthric speech due to reduced breath support resulting in softer than normal speech volume. See "skilled intervention" for more details. Pt has ID'd swallow issues during prior screens. Due to this, pt may benefit from an objective swallow assessment (modified barium swallow or FEES) during this course of therapy. Pt would benefit from skilled ST targeting incr'd volume in conversation. She would like to have one other therapy d/c prior to beginning ST. SLP has infomed pt's PT and OT of this.    Speech Therapy Frequency  2x / week    Duration  --   8 weeks, or 17 total sessions   Treatment/Interventions  Functional tasks;SLP instruction and feedback;Compensatory strategies;Patient/family education;Internal/external aids;Cueing hierarchy;Environmental controls    Potential to Achieve Goals  Good    Potential Considerations  Cooperation/participation level   pt has been reluctant to begin ST in the past   Consulted and Agree with Plan of Care  Patient       Patient will benefit from skilled therapeutic intervention in order to improve the following deficits and impairments:   Dysarthria and anarthria  Dysphagia, unspecified type    Problem List Patient Active Problem List   Diagnosis Date Noted  . Cervical stenosis (uterine cervix) 02/14/2018  . Postmenopausal bleeding 02/14/2018  . LBBB (left bundle branch block) 12/25/2015  . PVCs (premature ventricular contractions) 12/25/2015  . Hyperlipidemia 12/25/2015  . Fibromyalgia 01/16/2014  . Cholelithiasis 04/25/2013  . Paralysis agitans ([place]) 01/15/2013  . Unspecified  hereditary and idiopathic peripheral neuropathy 01/15/2013    [facility] ,[place], CCC-SLP  08/02/2019, 12:58  PM  [facility] [address] [place], [place], [address] Phone: [phone]   Fax:  [phone]   Name: Morgan [name] MRN: [id number] Date of Birth: December 08, 1950

## 2019-08-07 ENCOUNTER — Other Ambulatory Visit: Payer: Self-pay

## 2019-08-07 ENCOUNTER — Ambulatory Visit: Payer: Medicare Other

## 2019-08-07 DIAGNOSIS — R471 Dysarthria and anarthria: Secondary | ICD-10-CM | POA: Diagnosis not present

## 2019-08-07 DIAGNOSIS — R1310 Dysphagia, unspecified: Secondary | ICD-10-CM

## 2019-08-07 NOTE — Patient Instructions (Signed)
  Please complete the assigned speech therapy homework prior to your next session and return it to the speech therapist at your next visit.  

## 2019-08-07 NOTE — Therapy (Signed)
[facility] [address] [place], [place], [address] Phone: [phone]   Fax:  [phone]  Speech Language Pathology Treatment  Patient Details  Name: Morgan [name] MRN: [id number] Date of Birth: 03-22-1951 Referring Provider (SLP): Alonza Bogus, MD   Encounter Date: 08/07/2019  End of Session - 08/07/19 1427    Visit Number  6    Number of Visits  17    Date for SLP Re-Evaluation  09/26/19    SLP Start Time  24    SLP Stop Time   1400    SLP Time Calculation (min)  42 min    Activity Tolerance  Patient tolerated treatment well       Past Medical History:  Diagnosis Date  . Anxiety   . Fibromyalgia   . Hypercholesteremia    controlled on medication  . Hyperlipidemia   . Hypertension    no current meds  . LBBB (left bundle branch block)   . Melanoma ([place])   . MVP (mitral valve prolapse)   . Osteopenia   . Parkinson disease ([place])   . SVD (spontaneous vaginal delivery)    x 2    Past Surgical History:  Procedure Laterality Date  . BLEPHAROPLASTY    . cold knife conization    . COLONOSCOPY     FH colon CA  . DILATATION & CURETTAGE/HYSTEROSCOPY WITH MYOSURE N/A 02/14/2018   Procedure: ATTEMPTED DILATATION & CURETTAGE/HYSTEROSCOPY WITH MYOSURE;  Surgeon: Christophe Louis, MD;  Location: [facility];  Service: Gynecology;  Laterality: N/A;  polyp  . melanoma cancer     removed from left leg  . TONSILLECTOMY    . TUBAL LIGATION    . WISDOM TOOTH EXTRACTION      There were no vitals filed for this visit.  Subjective Assessment - 08/07/19 1322    Subjective  Pt with shuffling and meandering gait into ST room today.    Currently in Pain?  No/denies            ADULT SLP TREATMENT - 08/07/19 1324      General Information   Behavior/Cognition  Alert;Cooperative;Pleasant mood      Treatment Provided   Treatment provided  Cognitive-Linquistic      Cognitive-Linquistic Treatment   Treatment focused  on  Dysarthria    Skilled Treatment  SLP used loud "HEY" - average mid-upper 80s dB x8, then SLP had pt perform loud /a/ - average with 3 reps (due to COVID-19 risk) instead of 5 was at average low 80s dB independently. In 1-2 sentence responses pt req'd usual min cues faded to occasional min cues due to loudness decay. Pt again with minimal carryover in conversation between stimuli and in conversation prior to and after structured tasks. SLP reiterated at that time that pt must practice at home to make louder speech more habitual.      Assessment / Recommendations / Plan   Plan  Continue with current plan of care      Progression Toward Goals   Progression toward goals  Progressing toward goals       SLP Education - 08/07/19 1426    Education Details  need to practice at home for habitualized louder speech    Person(s) Educated  Patient    Methods  Explanation    Comprehension  Verbalized understanding       SLP Short Term Goals - 08/07/19 1427      SLP SHORT TERM GOAL #1   Title  pt will produce loud /a/ or "hey!" with at least mid 80s dB average over three sessions    Baseline  08-02-19, 08-07-19    Status  Partially Met      SLP SHORT TERM GOAL #2   Title  pt will maintain upper 60s dB volume in simple conversation of 5 mintues in 3 sessions    Status  Not Met      SLP SHORT TERM GOAL #3   Title  pt will keep a log of daily speech practice and maintain at least 80% practice frequency    Status  Deferred   pt is practicing without this means      SLP Long Term Goals - 08/07/19 [place] #1   Title  pt will produce loud /a/ or "hey!" with at least low 90s dB average over three sessions    Time  5    Period  Weeks    Status  On-going      SLP LONG TERM GOAL #2   Title  pt will use average low 70s dB speech in 10 minutes simple to mod complex conversation over three sessions    Time  5    Period  Weeks    Status  Revised   mod complex to "simple to  mod complex"      Plan - 08/07/19 1427    Clinical Impression Statement  Pt continues with  hypophonic/dysarthric speech due to reduced breath support resulting in softer than normal speech volume. See "skilled intervention" for more details. Pt has ID'd swallow issues during prior screens. Due to this, pt may benefit from an objective swallow assessment (modified barium swallow or FEES) during this course of therapy. Pt would benefit from skilled ST targeting incr'd volume in conversation. She would like to have one other therapy d/c prior to beginning ST. SLP has infomed pt's PT and OT of this.    Speech Therapy Frequency  2x / week    Duration  --   8 weeks, or 17 total sessions   Treatment/Interventions  Functional tasks;SLP instruction and feedback;Compensatory strategies;Patient/family education;Internal/external aids;Cueing hierarchy;Environmental controls    Potential to Achieve Goals  Good    Potential Considerations  Cooperation/participation level   pt has been reluctant to begin ST in the past   Consulted and Agree with Plan of Care  Patient       Patient will benefit from skilled therapeutic intervention in order to improve the following deficits and impairments:   Dysarthria and anarthria  Dysphagia, unspecified type    Problem List Patient Active Problem List   Diagnosis Date Noted  . Cervical stenosis (uterine cervix) 02/14/2018  . Postmenopausal bleeding 02/14/2018  . LBBB (left bundle branch block) 12/25/2015  . PVCs (premature ventricular contractions) 12/25/2015  . Hyperlipidemia 12/25/2015  . Fibromyalgia 01/16/2014  . Cholelithiasis 04/25/2013  . Paralysis agitans ([place]) 01/15/2013  . Unspecified hereditary and idiopathic peripheral neuropathy 01/15/2013    [facility] ,MS, CCC-SLP  08/07/2019, 2:29 PM  [facility] [address] [place], [place], [address] Phone: [phone]   Fax:   [phone]   Name: Shakeena Kafer MRN: [id number] Date of Birth: Jul 31, 1951

## 2019-08-13 ENCOUNTER — Ambulatory Visit: Payer: Medicare Other

## 2019-08-16 ENCOUNTER — Ambulatory Visit: Payer: Medicare Other

## 2019-09-01 ENCOUNTER — Other Ambulatory Visit: Payer: Self-pay | Admitting: Neurology

## 2019-09-06 ENCOUNTER — Other Ambulatory Visit: Payer: Self-pay | Admitting: Neurology

## 2019-09-18 ENCOUNTER — Other Ambulatory Visit: Payer: Self-pay | Admitting: Neurology

## 2019-09-19 ENCOUNTER — Ambulatory Visit: Payer: Medicare Other | Attending: Internal Medicine

## 2019-09-19 DIAGNOSIS — Z23 Encounter for immunization: Secondary | ICD-10-CM | POA: Insufficient documentation

## 2019-09-19 NOTE — Progress Notes (Signed)
   Covid-19 Vaccination Clinic  Name:  Morgan [name]    MRN: [id number] DOB: 1951-03-08  09/19/2019  Morgan [name] was observed post Covid-19 immunization for 15 minutes without incidence. She was provided with Vaccine Information Sheet and instruction to access the V-Safe system.   Morgan [name] was instructed to call 911 with any severe reactions post vaccine: Marland Kitchen Difficulty breathing  . Swelling of your face and throat  . A fast heartbeat  . A bad rash all over your body  . Dizziness and weakness    Immunizations Administered    Name Date Dose VIS Date Route   Pfizer COVID-19 Vaccine 09/19/2019  6:05 PM 0.3 mL 08/09/2019 Intramuscular   Manufacturer: [place]   Lot: [id number]   [place]: [id number]

## 2019-10-07 ENCOUNTER — Other Ambulatory Visit: Payer: Self-pay

## 2019-10-07 MED ORDER — CLONAZEPAM 1 MG PO TABS: ORAL_TABLET | ORAL | 0 refills | Status: DC

## 2019-10-08 ENCOUNTER — Other Ambulatory Visit: Payer: Self-pay

## 2019-10-08 MED ORDER — CLONAZEPAM 1 MG PO TABS: ORAL_TABLET | ORAL | 0 refills | Status: DC

## 2019-10-09 DIAGNOSIS — H353122 Nonexudative age-related macular degeneration, left eye, intermediate dry stage: Secondary | ICD-10-CM | POA: Diagnosis not present

## 2019-10-09 DIAGNOSIS — H353212 Exudative age-related macular degeneration, right eye, with inactive choroidal neovascularization: Secondary | ICD-10-CM | POA: Diagnosis not present

## 2019-10-10 ENCOUNTER — Ambulatory Visit: Payer: Medicare Other | Attending: Internal Medicine

## 2019-10-10 DIAGNOSIS — Z23 Encounter for immunization: Secondary | ICD-10-CM | POA: Insufficient documentation

## 2019-10-10 NOTE — Progress Notes (Signed)
   Covid-19 Vaccination Clinic  Name:  Morgan [name]    MRN: [id number] DOB: [birth year]  10/10/2019  [name] was observed post Covid-19 immunization for 15 minutes without incidence. She was provided with Vaccine Information Sheet and instruction to access the V-Safe system.   [name] was instructed to call 911 with any severe reactions post vaccine: Marland Kitchen Difficulty breathing  . Swelling of your face and throat  . A fast heartbeat  . A bad rash all over your body  . Dizziness and weakness    Immunizations Administered    Name Date Dose VIS Date Route   Pfizer COVID-19 Vaccine 10/10/2019 11:43 AM 0.3 mL 08/09/2019 Intramuscular   Manufacturer: [place]   Lot: [id number]   [place]: [id number]

## 2019-10-21 NOTE — Progress Notes (Signed)
Arzu Nabers was seen today in follow up for Parkinsons disease.  My previous records were reviewed prior to todays visit. Pt has had a few falls since last visit.  Pt denies lightheadedness, near syncope.  No hallucinations.  Mood has been good.  Has gotten her covid vaccine.  [place] and is getting around the parks with a scooter.  Husband told her that she is still screaming and talking in the middle of the night despite being back on klonopin for a week.  Current prescribed movement disorder medications: Carbidopa/levodopa 25/100, 2/2/1 Pramipexole ER, 0.75 mg daily Amantadine, 100 mg twice per day (added last visit) - states that she stopped that - doesn't recall what but had SE Cymbalta Gabapentin, 400 mg 3 times per day Clonazepam 1 mg nightly - ran out of it for a month and took xanax instead (was old) and now back on klonopin.     PREVIOUS MEDICATIONS: Mirapex and azilect; levodopa for a few weeks to "prove" benefit; requip; amantadine (felt had SE but ? What)  ALLERGIES:   Allergies  Allergen Reactions  . Other Anaphylaxis    "Anti Seizure Medications"  . Phenergan [Promethazine Hcl] Other (See Comments)    Makes Parkinson worse  . Sporanox [Itraconazole] Hives  . Tylenol [Acetaminophen] Other (See Comments)    "I just get weird"    CURRENT MEDICATIONS:  Outpatient Encounter Medications as of 10/22/2019  Medication Sig  . ALPRAZolam (XANAX) 0.25 MG tablet Take 0.25 mg by mouth daily as needed for anxiety.   Marland Kitchen aspirin 325 MG tablet Take 325-650 mg by mouth daily as needed (for headaches/pain.).   Marland Kitchen atorvastatin (LIPITOR) 10 MG tablet Take 10 mg by mouth at bedtime.  Marland Kitchen CALCIUM CITRATE PO Take 1 tablet by mouth daily. BariMelts Calcium Citrate   . carbidopa-levodopa (SINEMET IR) 25-100 MG tablet TAKE 2 TABLETS 3 TIMES A   DAY AS DIRECTED  . Cholecalciferol (VITAMIN D-3) 5000 units TABS Take 5,000 Units by mouth 2 (two) times daily.   . clonazePAM (KLONOPIN) 1 MG tablet One tablet qhs  . Coenzyme Q10-Vitamin E (QUNOL ULTRA COQ10 PO) Take 1 capsule by mouth daily.  . CYMBALTA 60 MG capsule TAKE 1 CAPSULE DAILY  . gabapentin (NEURONTIN) 400 MG capsule TAKE 1 CAPSULE 3 TIMES A   DAY  . Magnesium 400 MG TABS Take 400 mg by mouth at bedtime.  . Multiple Vitamins-Minerals (PRESERVISION AREDS 2 PO) Take 1 tablet by mouth 2 (two) times daily.  . Polyethylene Glycol 400 (BLINK TEARS) 0.25 % SOLN Place 1 drop into both eyes 3 (three) times daily as needed (for dry/irritated eyes.).  Marland Kitchen Pramipexole Dihydrochloride 0.75 MG TB24 TAKE 1 TABLET DAILY  . raloxifene (EVISTA) 60 MG tablet Take 60 mg by mouth daily.  . ramelteon (ROZEREM) 8 MG tablet Take 1 tablet (8 mg total) by mouth at bedtime.  . [DISCONTINUED] Selenium 100 MCG CAPS Take by mouth.   No facility-administered encounter medications on file as of 10/22/2019.    PHYSICAL EXAMINATION:    VITALS:   Vitals:   10/22/19 1525  BP: 138/89  Pulse: (!) 101  SpO2: 96%  Height: 5\' 5"  (1.651 m)    GEN:  The patient appears stated age and is in NAD. HEENT:  Normocephalic, atraumatic.  The mucous membranes are moist. The superficial temporal arteries are without ropiness or tenderness. CV:  RRR Lungs:  CTAB Neck/HEME:  There are no  carotid bruits bilaterally.  Neurological examination:  Orientation: The patient is alert and oriented x3. Cranial nerves: There is good facial symmetry with facial hypomimia. The speech is fluent and clear. Soft palate rises symmetrically and there is no tongue deviation. Hearing is intact to conversational tone. Sensation: Sensation is intact to light touch throughout Motor: Strength is at least antigravity x4.  Movement examination: Tone: There is normal tone in the UE/LE Abnormal movements: no tremor.  There is very mild bilateral LE dyskinesia, R>L Coordination:  There is mild decremation with RAM's, Gait and Station: The patient pushes  off of the chair to arise.  She is shuffling.  She is unstable.  She is bent at the knees.   ASSESSMENT/PLAN:  1.  Parkinsons Disease  -Continue carbidopa/levodopa 25/100, 2/2/1  -Continue pramipexole ER, 0.75 mg daily  -Think she could really benefit from some physical therapy.  She declines today.  She will think about it.  -Think she would do much better with the walker than a cane.  -Handicap placard filled out today. 2.  Fibromyalgia  -On Cymbalta  -On gabapentin, 400 mg 3 times per day 3.  Probable idiopathic peripheral neuropathy  -On Cymbalta and gabapentin 4.  History of melanoma  -We discussed that it used to be thought that levodopa would increase risk of melanoma but now it is believed that Parkinsons itself likely increases risk of melanoma. she is to get regular skin checks because of the increased risk factor for Parkinson's disease, and the increased risk factor because she has already had melanoma 5.  Insomnia with some RBD  -Hold Klonopin  -we discussed rozerem as she is still having RBD.  We will try this for a week by itself.  If she is still having RBD, we will try clonazepam, but only at 0.5 mg (half the dose) that she was previously at.  She will keep me updated  Total time spent on today's visit was  30 minutes, including both face-to-face time and nonface-to-face time.  Time included that spent on review of records (prior notes available to me/labs/imaging if pertinent), discussing treatment and goals, answering patient's questions and coordinating care.  Cc:  [name], MD

## 2019-10-22 ENCOUNTER — Other Ambulatory Visit: Payer: Self-pay

## 2019-10-22 ENCOUNTER — Ambulatory Visit (INDEPENDENT_AMBULATORY_CARE_PROVIDER_SITE_OTHER): Payer: Medicare Other | Admitting: Neurology

## 2019-10-22 ENCOUNTER — Encounter: Payer: Self-pay | Admitting: Neurology

## 2019-10-22 VITALS — BP 138/89 | HR 101 | Ht 65.0 in

## 2019-10-22 DIAGNOSIS — G20 Parkinson's disease: Secondary | ICD-10-CM

## 2019-10-22 DIAGNOSIS — G249 Dystonia, unspecified: Secondary | ICD-10-CM

## 2019-10-22 MED ORDER — RAMELTEON 8 MG PO TABS: 8.0000 mg | ORAL_TABLET | Freq: Every day | ORAL | 1 refills | Status: DC

## 2019-10-22 NOTE — Patient Instructions (Signed)
1.  Let me know if you change your mind and decided to do physical therapy 2.  Start rozerem, 8 mg, nightly.   3.  Hold klonopin/clonazepam for now to see how you do with rozerem.   4.  Let me know how you do with rozerem  The physicians and staff at [facility] are committed to providing excellent care. You may receive a survey requesting feedback about your experience at our office. We strive to receive "very good" responses to the survey questions. If you feel that your experience would prevent you from giving the office a "very good " response, please contact our office to try to remedy the situation. We may be reached at [phone]. Thank you for taking the time out of your busy day to complete the survey.

## 2019-10-30 ENCOUNTER — Telehealth: Payer: Self-pay | Admitting: Neurology

## 2019-10-30 MED ORDER — CLONAZEPAM 1 MG PO TABS: 0.5000 mg | ORAL_TABLET | Freq: Every day | ORAL | 0 refills | Status: DC

## 2019-10-30 NOTE — Telephone Encounter (Signed)
Patient states that Dr [name] put her on a new medication and it thinks it is a sleeping pill. She was to let Dr [name] know how she was doing on the pil she states she is waking up at 1 or 2 in the morning and cant  Get back to sleep

## 2019-10-30 NOTE — Telephone Encounter (Signed)
Patient has notified and voiced understanding; med list updated.

## 2019-10-30 NOTE — Telephone Encounter (Signed)
Spoke with patient and she states the Rozerem (Ramelteon). Patient states the medication is not working. It puts her to sleep but she does not sleep the entire night, gets up at 1 or 2 in the morning and can not go back to sleep. Patient states she is taking this medication at 8pm.

## 2019-10-30 NOTE — Telephone Encounter (Signed)
Have her keep taking that but have her add back klonopin but this time only HALF the tablet

## 2019-11-04 DIAGNOSIS — M797 Fibromyalgia: Secondary | ICD-10-CM | POA: Diagnosis not present

## 2019-11-04 DIAGNOSIS — M810 Age-related osteoporosis without current pathological fracture: Secondary | ICD-10-CM | POA: Diagnosis not present

## 2019-11-04 DIAGNOSIS — Z1389 Encounter for screening for other disorder: Secondary | ICD-10-CM | POA: Diagnosis not present

## 2019-11-04 DIAGNOSIS — E785 Hyperlipidemia, unspecified: Secondary | ICD-10-CM | POA: Diagnosis not present

## 2019-11-04 DIAGNOSIS — I10 Essential (primary) hypertension: Secondary | ICD-10-CM | POA: Diagnosis not present

## 2019-11-04 DIAGNOSIS — G20 Parkinson's disease: Secondary | ICD-10-CM | POA: Diagnosis not present

## 2019-11-04 DIAGNOSIS — F419 Anxiety disorder, unspecified: Secondary | ICD-10-CM | POA: Diagnosis not present

## 2019-11-04 DIAGNOSIS — Z1211 Encounter for screening for malignant neoplasm of colon: Secondary | ICD-10-CM | POA: Diagnosis not present

## 2019-11-04 DIAGNOSIS — E2839 Other primary ovarian failure: Secondary | ICD-10-CM | POA: Diagnosis not present

## 2019-11-04 DIAGNOSIS — Z0000 Encounter for general adult medical examination without abnormal findings: Secondary | ICD-10-CM | POA: Diagnosis not present

## 2019-11-08 ENCOUNTER — Other Ambulatory Visit: Payer: Self-pay

## 2019-11-08 ENCOUNTER — Telehealth: Payer: Self-pay | Admitting: Neurology

## 2019-11-08 MED ORDER — CLONAZEPAM 0.5 MG PO TABS: 0.5000 mg | ORAL_TABLET | Freq: Every day | ORAL | 4 refills | Status: DC

## 2019-11-08 NOTE — Telephone Encounter (Signed)
[facility] script DC new script for 0.5mg  tablet  sent

## 2019-11-08 NOTE — Telephone Encounter (Signed)
Call pharmacy (I think mail order) and cancel refills on the 1mg  Send new RX for the klonopin - 0.5 mg - 1 tablet at bed.  If doing mail order #90, RF 1

## 2019-11-08 NOTE — Telephone Encounter (Signed)
Patient called and said she'd like to know if there is a .5 MG dosage of clonazepam she can get a new prescription for. She said when she tries to cut the 1 MG in half, it crumbles into pieces.  [facility] [address]

## 2019-11-15 ENCOUNTER — Telehealth: Payer: Self-pay | Admitting: Neurology

## 2019-11-15 NOTE — Telephone Encounter (Signed)
Patient left msg with after hours about needing Dr. Carles Collet to call and override a prescription. She is needing a refill on the ramelgeon via mail order. She said she is only allowed 30 pills for the whole year. Thanks!

## 2019-11-15 NOTE — Telephone Encounter (Signed)
Spoke with patient and she states the pharmacy gave her a print out and it states that the provider needs to call [phone] to do a quantity override. Patient states if the rx can be refilled she would prefer it go to [place].   Patient states she will be out of town next week and has enough meds to last her until then. Patient states sometimes meds have to be sent to the mail order instead of being sent to the local pharmacy.  Advised patient that I call and see what I could find out. Pt voiced understanding.   Working on [place] will contact patient once we hear back from [facility].

## 2019-11-16 ENCOUNTER — Other Ambulatory Visit: Payer: Self-pay | Admitting: Neurology

## 2019-11-18 NOTE — Telephone Encounter (Signed)
Unfortunately, her insurance declined the medication and there really aren't any good options.  That medication (rozerem) is about $67 per month at [facility] if she uses [facility].  Would she like to try that option?

## 2019-11-18 NOTE — Telephone Encounter (Signed)
What are you going to change the Ramelton 8mg  to?

## 2019-11-18 NOTE — Telephone Encounter (Signed)
Called pharmacy and spoke with Pharmacist and he stated the medication does not need a prior auth just a ninety day refill.   Rx(s) sent to pharmacy electronically.  Spoke with patient and informed her that [facility] and he stated the medication, Rozerem does not need a prior approval she just needed a ninety day rx. Rx sent. Advised patient to go to the pharmacy and see if she can get her medication. Informed her that I the prior auth that I did on Friday was denied. She stated if the rx does not go through [facility] to [facility] to [facility]. I advised her that if neither one of those worked then I would speak with Dr [name] about changing her medication.  Patient stated they were out of town and would handle this matter when they came to town next week.

## 2019-11-19 NOTE — Telephone Encounter (Signed)
Attempted to contact patient on both her and her spouses phone number. No answer and unable to leave a voicemail.

## 2019-11-25 ENCOUNTER — Telehealth: Payer: Self-pay

## 2019-11-25 NOTE — Telephone Encounter (Signed)
On November 15, 2019 received prior authorization for Rozerem for patient.   Prior auth completed via CoverMyMeds. First received denial.   Friday completed another prior auth because the wrong dx code was use.   On 11/22/2019 redid the prior auth and received an approval letter stating that Rozerem has been approved from 10/23/2019-11/21/2020.   Message sent to patient via mychart to make her aware.   Approval letter will be scanned into patients chart.

## 2019-11-26 MED ORDER — RAMELTEON 8 MG PO TABS: 8.0000 mg | ORAL_TABLET | Freq: Every day | ORAL | 0 refills | Status: DC

## 2019-11-26 NOTE — Telephone Encounter (Signed)
Rozerum rx sent to pharmacy.

## 2019-11-26 NOTE — Telephone Encounter (Signed)
ROZEREM  Pt request send prescription to [facility].

## 2019-11-30 ENCOUNTER — Other Ambulatory Visit: Payer: Self-pay | Admitting: Neurology

## 2019-12-12 DIAGNOSIS — M85832 Other specified disorders of bone density and structure, left forearm: Secondary | ICD-10-CM | POA: Diagnosis not present

## 2020-01-10 DIAGNOSIS — H35372 Puckering of macula, left eye: Secondary | ICD-10-CM | POA: Diagnosis not present

## 2020-01-10 DIAGNOSIS — H353122 Nonexudative age-related macular degeneration, left eye, intermediate dry stage: Secondary | ICD-10-CM | POA: Diagnosis not present

## 2020-01-10 DIAGNOSIS — H43813 Vitreous degeneration, bilateral: Secondary | ICD-10-CM | POA: Diagnosis not present

## 2020-01-10 DIAGNOSIS — H353212 Exudative age-related macular degeneration, right eye, with inactive choroidal neovascularization: Secondary | ICD-10-CM | POA: Diagnosis not present

## 2020-01-20 DIAGNOSIS — Z1159 Encounter for screening for other viral diseases: Secondary | ICD-10-CM | POA: Diagnosis not present

## 2020-01-23 DIAGNOSIS — Z86010 Personal history of colonic polyps: Secondary | ICD-10-CM | POA: Diagnosis not present

## 2020-01-23 DIAGNOSIS — K635 Polyp of colon: Secondary | ICD-10-CM | POA: Diagnosis not present

## 2020-01-23 DIAGNOSIS — Z800 Family history of malignant neoplasm of digestive organs: Secondary | ICD-10-CM | POA: Diagnosis not present

## 2020-01-23 DIAGNOSIS — D123 Benign neoplasm of transverse colon: Secondary | ICD-10-CM | POA: Diagnosis not present

## 2020-01-23 DIAGNOSIS — K5730 Diverticulosis of large intestine without perforation or abscess without bleeding: Secondary | ICD-10-CM | POA: Diagnosis not present

## 2020-01-28 DIAGNOSIS — D123 Benign neoplasm of transverse colon: Secondary | ICD-10-CM | POA: Diagnosis not present

## 2020-01-28 DIAGNOSIS — K635 Polyp of colon: Secondary | ICD-10-CM | POA: Diagnosis not present

## 2020-02-10 ENCOUNTER — Other Ambulatory Visit: Payer: Self-pay | Admitting: Neurology

## 2020-02-11 NOTE — Telephone Encounter (Signed)
I'm sending these back to you so that you know its okay to refill these per our protocol.  Our protocol is to look at my old note and if no changes in RX and patient has been seen in last 6 months and has a f/u already, you can refill it without sending to me

## 2020-03-19 DIAGNOSIS — K1121 Acute sialoadenitis: Secondary | ICD-10-CM | POA: Diagnosis not present

## 2020-03-20 ENCOUNTER — Ambulatory Visit: Payer: Medicare Other | Admitting: Neurology

## 2020-03-30 NOTE — Progress Notes (Signed)
Assessment/Plan:   1.  Parkinsons Disease  -Continue carbidopa/levodopa 25/100, 2/2/1  -Continue pramipexole ER, 0.75 mg daily  -Congratulated her on the exercise with ACT. 2.  Fibromyalgia  -On Cymbalta  -On gabapentin, 400 mg 3 times daily 3.  History of melanoma  -See dermatology regularly, given the fact that she has had melanoma and Parkinson's increases risk for melanoma. 4.  Insomnia, with RBD  -On a combination of clonazepam and Rozerem. 5.  Follow up is anticipated in the next 4-6 months, sooner should new neurologic issues arise.  Subjective:   Morgan [name] was seen today in follow up for Parkinsons disease.  My previous records were reviewed prior to todays visit as well as outside records available to me. Pt had a few falls since last visit - often in the turn and gets feet caught up.  No injuries with falls.  She can get herself back up.   Pt denies lightheadedness, near syncope.  No hallucinations.  Mood has been good.  Has derm visit in sept.   Training with [facility] with [facility] 3 days a week.  Current prescribed movement disorder medications:  Carbidopa/levodopa 25/100, 2/2/1 Pramipexole ER, 0.75 mg daily Cymbalta Gabapentin, 400 mg 3 times per day Clonazepam 0.5 mg q hs Rozerem, 8 mg q hs (started last visit)  PREVIOUS MEDICATIONS: Mirapex and azilect; levodopa;; requip; amantadine (stopped for SE - ?what)  ALLERGIES:   Allergies  Allergen Reactions  . Other Anaphylaxis    "Anti Seizure Medications"  . Phenergan [Promethazine Hcl] Other (See Comments)    Makes Parkinson worse  . Sporanox [Itraconazole] Hives  . Tylenol [Acetaminophen] Other (See Comments)    "I just get weird"    CURRENT MEDICATIONS:  Outpatient Encounter Medications as of 03/31/2020  Medication Sig  . ALPRAZolam (XANAX) 0.25 MG tablet Take 0.25 mg by mouth daily as needed for anxiety.   Marland Kitchen aspirin 325 MG tablet Take 325-650 mg by mouth daily as needed (for headaches/pain.).   Marland Kitchen  atorvastatin (LIPITOR) 10 MG tablet Take 10 mg by mouth at bedtime.  Marland Kitchen CALCIUM CITRATE PO Take 1 tablet by mouth daily. BariMelts Calcium Citrate   . carbidopa-levodopa (SINEMET IR) 25-100 MG tablet TAKE 2 TABLETS 3 TIMES A   DAY AS DIRECTED (Patient taking differently: Take 2 tabs in the morning, 2 tabs in the afternoon and one tablet in the evening)  . Cholecalciferol (VITAMIN D-3) 5000 units TABS Take 5,000 Units by mouth 2 (two) times daily.  . clonazePAM (KLONOPIN) 0.5 MG tablet Take 1 tablet (0.5 mg total) by mouth at bedtime.  . Coenzyme Q10-Vitamin E (QUNOL ULTRA COQ10 PO) Take 1 capsule by mouth daily.  . CYMBALTA 60 MG capsule TAKE 1 CAPSULE DAILY  . gabapentin (NEURONTIN) 400 MG capsule TAKE 1 CAPSULE 3 TIMES A   DAY  . methocarbamol (ROBAXIN) 750 MG tablet Take 750 mg by mouth every 6 (six) hours as needed.  . Multiple Vitamins-Minerals (PRESERVISION AREDS 2 PO) Take 1 tablet by mouth 2 (two) times daily.  . Pramipexole Dihydrochloride 0.75 MG TB24 TAKE 1 TABLET DAILY  . raloxifene (EVISTA) 60 MG tablet Take 60 mg by mouth daily.  . ramelteon (ROZEREM) 8 MG tablet TAKE 1 TABLET AT BEDTIME  . [DISCONTINUED] Magnesium 400 MG TABS Take 400 mg by mouth at bedtime. (Patient not taking: Reported on 03/31/2020)  . [DISCONTINUED] Polyethylene Glycol 400 (BLINK TEARS) 0.25 % SOLN Place 1 drop into both eyes 3 (three) times daily as needed (  for dry/irritated eyes.). (Patient not taking: Reported on 03/31/2020)  . [DISCONTINUED] ramelteon (ROZEREM) 8 MG tablet TAKE 1 TABLET BY MOUTH AT BEDTIME (Patient not taking: Reported on 03/31/2020)   No facility-administered encounter medications on file as of 03/31/2020.    Objective:   PHYSICAL EXAMINATION:    VITALS:   Vitals:   03/31/20 1531  BP: 133/77  Pulse: 85  SpO2: 96%  Weight: 164 lb (74.4 kg)  Height: 5\' 5"  (1.651 m)    GEN:  The patient appears stated age and is in NAD. HEENT:  Normocephalic, atraumatic.  The mucous membranes are moist.  The superficial temporal arteries are without ropiness or tenderness. CV:  RRR Lungs:  CTAB Neck/HEME:  There are no carotid bruits bilaterally.  Neurological examination:  Orientation: The patient is alert and oriented x3. Cranial nerves: There is good facial symmetry with  facial hypomimia. The speech is fluent and clear. Soft palate rises symmetrically and there is no tongue deviation. Hearing is intact to conversational tone. Sensation: Sensation is intact to light touch throughout Motor: Strength is at least antigravity x4.  Movement examination: Tone: There is normal tone in the ue/le Abnormal movements: very mild dyskinesia in the R leg. Coordination:  There is mild decremation with RAM's, with finger taps bilaterally Gait and Station: The patient has no difficulty arising out of a deep-seated chair without the use of the hands. The patient's stride length is ataxic.   She is somewhat unbalanced.  I have reviewed and interpreted the following labs independently    Chemistry      Component Value Date/Time   NA 140 02/05/2018 1325   K 3.7 02/05/2018 1325   CL 103 02/05/2018 1325   CO2 28 02/05/2018 1325   BUN 15 02/05/2018 1325   CREATININE 0.88 02/05/2018 1325      Component Value Date/Time   CALCIUM 9.2 02/05/2018 1325   ALKPHOS 82 09/03/2008 0905   AST 27 09/03/2008 0905   ALT 13 09/03/2008 0905   BILITOT 0.7 09/03/2008 0905       Lab Results  Component Value Date   WBC 8.3 02/05/2018   HGB 13.3 02/05/2018   HCT 41.1 02/05/2018   MCV 89.3 02/05/2018   PLT 223 02/05/2018    Lab Results  Component Value Date   TSH 0.80 01/15/2013     Total time spent on today's visit was 30 minutes, including both face-to-face time and nonface-to-face time.  Time included that spent on review of records (prior notes available to me/labs/imaging if pertinent), discussing treatment and goals, answering patient's questions and coordinating care.  Cc:  [name], MD

## 2020-03-31 ENCOUNTER — Encounter: Payer: Self-pay | Admitting: Neurology

## 2020-03-31 ENCOUNTER — Ambulatory Visit (INDEPENDENT_AMBULATORY_CARE_PROVIDER_SITE_OTHER): Payer: Medicare Other | Admitting: Neurology

## 2020-03-31 ENCOUNTER — Other Ambulatory Visit: Payer: Self-pay

## 2020-03-31 VITALS — BP 133/77 | HR 85 | Ht 65.0 in | Wt 164.0 lb

## 2020-03-31 DIAGNOSIS — G249 Dystonia, unspecified: Secondary | ICD-10-CM

## 2020-03-31 DIAGNOSIS — G20 Parkinson's disease: Secondary | ICD-10-CM | POA: Diagnosis not present

## 2020-03-31 NOTE — Patient Instructions (Signed)
Good to see you!  No changes in your medication.  The physicians and staff at [facility] are committed to providing excellent care. You may receive a survey requesting feedback about your experience at our office. We strive to receive "very good" responses to the survey questions. If you feel that your experience would prevent you from giving the office a "very good " response, please contact our office to try to remedy the situation. We may be reached at [phone]. Thank you for taking the time out of your busy day to complete the survey.

## 2020-05-03 ENCOUNTER — Other Ambulatory Visit: Payer: Self-pay | Admitting: Neurology

## 2020-05-06 DIAGNOSIS — G20 Parkinson's disease: Secondary | ICD-10-CM | POA: Diagnosis not present

## 2020-05-06 DIAGNOSIS — F419 Anxiety disorder, unspecified: Secondary | ICD-10-CM | POA: Diagnosis not present

## 2020-05-06 DIAGNOSIS — M797 Fibromyalgia: Secondary | ICD-10-CM | POA: Diagnosis not present

## 2020-05-06 DIAGNOSIS — E785 Hyperlipidemia, unspecified: Secondary | ICD-10-CM | POA: Diagnosis not present

## 2020-05-06 DIAGNOSIS — Z5181 Encounter for therapeutic drug level monitoring: Secondary | ICD-10-CM | POA: Diagnosis not present

## 2020-05-06 NOTE — Telephone Encounter (Signed)
Rx(s) sent to pharmacy electronically.  

## 2020-05-12 ENCOUNTER — Other Ambulatory Visit: Payer: Self-pay | Admitting: Neurology

## 2020-05-12 NOTE — Telephone Encounter (Signed)
Refill request for pts klonopin declined as noted that pt also received xanax from PCP in [place] and in [place] (05/06/20 received xanax, 0.25 mg, #90)

## 2020-05-20 DIAGNOSIS — D72829 Elevated white blood cell count, unspecified: Secondary | ICD-10-CM | POA: Diagnosis not present

## 2020-06-16 ENCOUNTER — Other Ambulatory Visit: Payer: Self-pay | Admitting: Neurology

## 2020-06-16 NOTE — Telephone Encounter (Signed)
Please call patient.  i'm declining refill of her klonopin.  She just got #90 xanax from PCP on 9/8.  This is similar to klonopin.  I believe i've addressed this with her before.  Not only are they similar meds but she is receiving from different prescribers.  She will need to receive from only one in the future (now PCP).

## 2020-06-19 ENCOUNTER — Other Ambulatory Visit: Payer: Self-pay | Admitting: Neurology

## 2020-06-19 DIAGNOSIS — Z23 Encounter for immunization: Secondary | ICD-10-CM | POA: Diagnosis not present

## 2020-06-22 NOTE — Telephone Encounter (Signed)
Just received a call from the patient and her spouse who states the patient is not taking the Klonopin because it has been replaced with Rozerem. Spouse states the patient does not need the medication because the Rozerem and Xanax are working well for the patient. Spouse states he did not know his wife was no longer taking the Klonopin.   They were calling to make [place] aware the Klonopin rx was not needed.

## 2020-06-22 NOTE — Telephone Encounter (Signed)
Morgan [name], this is the 2nd request for klonopin.  It is being declined because the patient is receiving xanax from pcp. Cannot be on both these meds so I am going to defer to PCP now to write the benzo.  Let pt know.  I have let the pharmacy know twice.

## 2020-06-22 NOTE — Telephone Encounter (Signed)
Spoke with patient and informed her that Dr [name] is not going to refill the Klonopin because she is also receiving Xanax from her PCP. (spouse is yelling in the background that Xanax is used for sleep and he does not see the problem with the patient taking both and that they are used for two different things) I explained to patient that Klonopin and Xanax are in the same family. I advised her to contact her pcp to get a refill on the Xanax because Dr [name] is not agreeable with her taking both. (spouse yelling in the background: states the patient would rather have the rx from Dr [name]) Then patient asks if she can stop the Xanax from her pcp and only take the medication from Dr [name] because it works bettert. (spouse yells well I will speak with Dr [name] about that at her Next appointment)  She requested I speak with Dr [name] and see if she can have her Klonopin because it does help her legs. (spouse yelling in the background that they will contact pcp)

## 2020-06-22 NOTE — Telephone Encounter (Signed)
Unfortunately, we had this issue in the past and have discussed this previously.  She will need to discuss with PCP.  I can no longer prescribe the medication

## 2020-06-22 NOTE — Telephone Encounter (Signed)
See chart.  I have already addressed this today (see phone note) and declined this about 1-2 weeks ago for same reason.  You should have a phone note to call the patient

## 2020-06-25 DIAGNOSIS — L578 Other skin changes due to chronic exposure to nonionizing radiation: Secondary | ICD-10-CM | POA: Diagnosis not present

## 2020-06-25 DIAGNOSIS — D1721 Benign lipomatous neoplasm of skin and subcutaneous tissue of right arm: Secondary | ICD-10-CM | POA: Diagnosis not present

## 2020-06-25 DIAGNOSIS — D2271 Melanocytic nevi of right lower limb, including hip: Secondary | ICD-10-CM | POA: Diagnosis not present

## 2020-06-25 DIAGNOSIS — L820 Inflamed seborrheic keratosis: Secondary | ICD-10-CM | POA: Diagnosis not present

## 2020-06-25 DIAGNOSIS — Z808 Family history of malignant neoplasm of other organs or systems: Secondary | ICD-10-CM | POA: Diagnosis not present

## 2020-06-25 DIAGNOSIS — L821 Other seborrheic keratosis: Secondary | ICD-10-CM | POA: Diagnosis not present

## 2020-06-25 DIAGNOSIS — Z85820 Personal history of malignant melanoma of skin: Secondary | ICD-10-CM | POA: Diagnosis not present

## 2020-07-02 ENCOUNTER — Other Ambulatory Visit: Payer: Self-pay | Admitting: Neurology

## 2020-09-10 ENCOUNTER — Other Ambulatory Visit: Payer: Self-pay | Admitting: Neurology

## 2020-09-16 DIAGNOSIS — Z1231 Encounter for screening mammogram for malignant neoplasm of breast: Secondary | ICD-10-CM | POA: Diagnosis not present

## 2020-09-29 NOTE — Progress Notes (Signed)
Assessment/Plan:    1.  Parkinsons Disease             - They don't like dyskinesia.  They would like to retry rytary.  It was tried in 2015, but while it helped her dyskinesia, she did not like the fact that it did not release out quickly.  However, we may have under shot the dose somewhat.  We will retry at a different dose.  She will start Rytary 195 mg, 2 at 8am/2 at 1pm/1 at 6pm.  Samples provided.             -Continue pramipexole ER, 0.75 mg daily.  They asked about increasing this and I don't want to do that due to age and hx of compulsive behavior on higher dosages.             -Congratulated her on the exercise with [facility].  -they asked about inbrija.  Decided to hold on that for now.  We can discuss this again in the future. 2.  Fibromyalgia             -On Cymbalta             -On gabapentin, 400 mg 3 times daily 3.  History of melanoma             -See dermatology regularly, given the fact that she has had melanoma and Parkinson's increases risk for melanoma. 4.  Insomnia, with RBD             -On Rozerem.  -On alprazolam, 0.25 mg at bedtime by primary care.  Subjective:   Morgan [name] was seen today in follow up for Parkinsons disease.  My previous records were reviewed prior to todays visit as well as outside records available to me.   Pt with husband who supplements history.  Pt asks about changing levodopa because they think it causes her to fall.  He thinks that she walks better in the AM.  Husband states that most of the falls are in a turn or if she squats or gets back up.  She doesn't use an ambulatory assistive device in the home but usually uses walking stick out of the home (has a walker).   Pt denies lightheadedness, near syncope.  No hallucinations.  Mood has been good.  Current prescribed movement disorder medications: carbidopa/levodopa 25/100, 2/2/1 (7am/noon/7pm) but yesterday she tried 1.5/1.5/1 Pramipexole ER, 0.75 mg daily Cymbalta Gabapentin, 400 mg 3  times per day Rozerem, 8 mg q hs (started last visit)  PREVIOUS MEDICATIONS:Mirapex and azilect; levodopa;; requip; amantadine (stopped for SE - ?what); clonazepam (taken off when primary care started alprazolam)  ALLERGIES:   Allergies  Allergen Reactions   Other Anaphylaxis    "Anti Seizure Medications"   Phenergan [Promethazine Hcl] Other (See Comments)    Makes Parkinson worse   Sporanox [Itraconazole] Hives   Tylenol [Acetaminophen] Other (See Comments)    "I just get weird"    CURRENT MEDICATIONS:  Outpatient Encounter Medications as of 10/01/2020  Medication Sig   ALPRAZolam (XANAX) 0.25 MG tablet Take 0.25 mg by mouth daily as needed for anxiety.    aspirin 325 MG tablet Take 325-650 mg by mouth daily as needed (for headaches/pain.).    atorvastatin (LIPITOR) 10 MG tablet Take 10 mg by mouth at bedtime.   CALCIUM CITRATE PO Take 1 tablet by mouth daily. BariMelts Calcium Citrate   carbidopa-levodopa (SINEMET IR) 25-100 MG tablet TAKE 2 TABLETS IN THE  MORNING, TAKE 2 TABLETS IN THE AFTERNOON AND TAKE 1   TABLET EVERY EVENING   Cholecalciferol (VITAMIN D-3) 5000 units TABS Take 5,000 Units by mouth 2 (two) times daily.   Coenzyme Q10-Vitamin E (QUNOL ULTRA COQ10 PO) Take 1 capsule by mouth daily.   CYMBALTA 60 MG capsule TAKE 1 CAPSULE DAILY   gabapentin (NEURONTIN) 400 MG capsule TAKE 1 CAPSULE 3 TIMES A   DAY   methocarbamol (ROBAXIN) 750 MG tablet Take 750 mg by mouth every 6 (six) hours as needed.   Multiple Vitamins-Minerals (PRESERVISION AREDS 2 PO) Take 1 tablet by mouth 2 (two) times daily.   Pramipexole Dihydrochloride 0.75 MG TB24 TAKE 1 TABLET DAILY   raloxifene (EVISTA) 60 MG tablet Take 60 mg by mouth daily.   ramelteon (ROZEREM) 8 MG tablet TAKE 1 TABLET AT BEDTIME   [DISCONTINUED] clonazePAM (KLONOPIN) 0.5 MG tablet Take 1 tablet (0.5 mg total) by mouth at bedtime. (Patient not taking: Reported on 10/01/2020)   No facility-administered  encounter medications on file as of 10/01/2020.    Objective:   PHYSICAL EXAMINATION:    VITALS:   Vitals:   10/01/20 1528  BP: 126/70  Pulse: 96  SpO2: 98%  Weight: 170 lb (77.1 kg)  Height: 5\' 5"  (1.651 m)    GEN:  The patient appears stated age and is in NAD. HEENT:  Normocephalic, atraumatic.  The mucous membranes are moist. The superficial temporal arteries are without ropiness or tenderness. CV:  RRR Lungs:  CTAB Neck/HEME:  There are no carotid bruits bilaterally.  Neurological examination:  Orientation: The patient is alert and oriented x3. Cranial nerves: There is good facial symmetry with mild facial hypomimia. The speech is fluent and clear. Soft palate rises symmetrically and there is no tongue deviation. Hearing is intact to conversational tone. Sensation: Sensation is intact to light touch throughout Motor: Strength is at least antigravity x4.  Movement examination: Tone: There is normal tone in the upper and lower extremities. Abnormal movements: Mild Inbrija dyskinesia in the right foot Gait and Station: The patient has easily difficulty arising out of a deep-seated chair without the use of the hands. The patient's stride length is good and she is less ataxic today.  I have reviewed and interpreted the following labs independently    Chemistry      Component Value Date/Time   NA 140 02/05/2018 1325   K 3.7 02/05/2018 1325   CL 103 02/05/2018 1325   CO2 28 02/05/2018 1325   BUN 15 02/05/2018 1325   CREATININE 0.88 02/05/2018 1325      Component Value Date/Time   CALCIUM 9.2 02/05/2018 1325   ALKPHOS 82 09/03/2008 0905   AST 27 09/03/2008 0905   ALT 13 09/03/2008 0905   BILITOT 0.7 09/03/2008 0905       Lab Results  Component Value Date   WBC 8.3 02/05/2018   HGB 13.3 02/05/2018   HCT 41.1 02/05/2018   MCV 89.3 02/05/2018   PLT 223 02/05/2018    Lab Results  Component Value Date   TSH 0.80 01/15/2013     Total time spent on today's  visit was 45 minutes, including both face-to-face time and nonface-to-face time.  Time included that spent on review of records (prior notes available to me/labs/imaging if pertinent), discussing treatment and goals, answering patient's questions and coordinating care.  Cc:  [name], MD

## 2020-10-01 ENCOUNTER — Other Ambulatory Visit: Payer: Self-pay

## 2020-10-01 ENCOUNTER — Ambulatory Visit (INDEPENDENT_AMBULATORY_CARE_PROVIDER_SITE_OTHER): Payer: Medicare Other | Admitting: Neurology

## 2020-10-01 ENCOUNTER — Encounter: Payer: Self-pay | Admitting: Neurology

## 2020-10-01 VITALS — BP 126/70 | HR 96 | Ht 65.0 in | Wt 170.0 lb

## 2020-10-01 DIAGNOSIS — G20 Parkinson's disease: Secondary | ICD-10-CM

## 2020-10-01 DIAGNOSIS — G249 Dystonia, unspecified: Secondary | ICD-10-CM

## 2020-10-01 MED ORDER — RYTARY 48.75-195 MG PO CPCR: ORAL_CAPSULE | ORAL | 0 refills | Status: DC

## 2020-10-01 NOTE — Progress Notes (Signed)
Samples of this drug rytary 195mg  were given to the patient, quantity3 boxs, Lot Number [id number] A exp 09/22

## 2020-10-01 NOTE — Patient Instructions (Signed)
1.  STOP your carbidopa/levodopa 25/100 IR (yellow pill) 2.  Start rytary 195 mg, 2 at 8am/2 at 1pm/1 at 6pm

## 2020-10-05 ENCOUNTER — Telehealth: Payer: Self-pay | Admitting: Neurology

## 2020-10-05 NOTE — Telephone Encounter (Signed)
See below.  I need the answers below first.

## 2020-10-05 NOTE — Telephone Encounter (Signed)
Spoke patients spouse he states the patient started the medication on Friday. He states after she first took the first dose of medication she was unsteady and could not control her walking. He states on Friday when the patient started the medication he gave her 2 tabs in the morning 2 in the afternoon and one in the evening. He states the next day he decreased the medication to one tab tid on Saturday and Sunday.   He states today the patient is walking but having trouble getting up and down. He states he knows the medication was changed and the patient was doing ok until today (with his changes). He thinks the levodopa may have worn off from her previous sinemet medication(and that could be why she is not doing ok). He states that he thinks the patients dosage needs to be adjusted. He said because 1 tab tid is too much because he thinks that if he takes his eye off the patient she will buss her head. He also states that she has some Sinemet left and wants to know if they need to add that back to her daily regimen.   Advised patient that I would speak with Dr [name] and get back with him. He voiced understanding.

## 2020-10-05 NOTE — Telephone Encounter (Signed)
A lot of this isn't making sense.  1 three times per day is too much (this is much different than prescribed)?  If she cannot get up, its likely not enough.  Is she dyskinetic (wiggling, having excess movements)?

## 2020-10-05 NOTE — Telephone Encounter (Signed)
Patient's spouse called and asked for a call back to modify the Rytary dosage. He said they have samples and may need more if the dosage changes.

## 2020-10-06 MED ORDER — RYTARY 36.25-145 MG PO CPCR: ORAL_CAPSULE | ORAL | 0 refills | Status: DC

## 2020-10-06 NOTE — Telephone Encounter (Signed)
Give her the 145 mg samples instead of the 195mg  and have her follow the previously given schedule: 145 mg, 2 at 8am/2 at 1pm/1 at 6pm

## 2020-10-06 NOTE — Telephone Encounter (Signed)
Attempted to call patient.  No answer.

## 2020-10-06 NOTE — Telephone Encounter (Signed)
He states yes she was dyskinetic with two tabs three times a day. He states he thought that was too much. He states the patient was worse with the two rytary than with one tab.   He states he wants to know if y'all can find a happy medium.   He is suggesting that you give his wife 1 tab and a quarter or 1 and half tab of rytary.   He states he is not giving her two tabs tid because he feels like it is too much and making the patient dyskinetic.   He is wondering if maybe you can increase or change the dose.   He states he will be in the [place] area and can bring back the unused samples if he needs to.

## 2020-10-06 NOTE — Telephone Encounter (Signed)
Spoke with patients spouse and gave him Dr Doristine Devoid recommendations. He voiced understanding and states he will pick up samples tomorrow morning.  Samples placed up front for pick up.

## 2020-10-09 ENCOUNTER — Encounter: Payer: Self-pay | Admitting: Neurology

## 2020-10-09 NOTE — Progress Notes (Signed)
[name]: [id number] help? Call us at [phone] Outcome Additional Information Required Your PA has been resolved, no additional PA is required. For further inquiries please contact the number on the back of the member prescription card. (Message [phone]) Drug Rytary 36.25-145MG  er capsules Form [employer] Electronic PA Form [phone] NCPDP)

## 2020-10-28 DIAGNOSIS — Z20822 Contact with and (suspected) exposure to covid-19: Secondary | ICD-10-CM | POA: Diagnosis not present

## 2020-10-28 DIAGNOSIS — Z03818 Encounter for observation for suspected exposure to other biological agents ruled out: Secondary | ICD-10-CM | POA: Diagnosis not present

## 2020-10-29 DIAGNOSIS — Z20822 Contact with and (suspected) exposure to covid-19: Secondary | ICD-10-CM | POA: Diagnosis not present

## 2020-10-30 DIAGNOSIS — J069 Acute upper respiratory infection, unspecified: Secondary | ICD-10-CM | POA: Diagnosis not present

## 2020-10-30 DIAGNOSIS — U071 COVID-19: Secondary | ICD-10-CM | POA: Diagnosis not present

## 2020-10-31 ENCOUNTER — Other Ambulatory Visit: Payer: Self-pay | Admitting: Adult Health

## 2020-10-31 ENCOUNTER — Ambulatory Visit (HOSPITAL_COMMUNITY)
Admission: RE | Admit: 2020-10-31 | Discharge: 2020-10-31 | Disposition: A | Discharge status: Home / Self Care | Payer: Medicare Other | Source: Ambulatory Visit | Attending: Pulmonary Disease | Admitting: Pulmonary Disease

## 2020-10-31 DIAGNOSIS — R54 Age-related physical debility: Secondary | ICD-10-CM | POA: Diagnosis not present

## 2020-10-31 DIAGNOSIS — F89 Unspecified disorder of psychological development: Secondary | ICD-10-CM | POA: Insufficient documentation

## 2020-10-31 DIAGNOSIS — U071 COVID-19: Secondary | ICD-10-CM | POA: Insufficient documentation

## 2020-10-31 MED ORDER — ALBUTEROL SULFATE HFA 108 (90 BASE) MCG/ACT IN AERS: 2.0000 | INHALATION_SPRAY | Freq: Once | RESPIRATORY_TRACT | Status: DC | PRN

## 2020-10-31 MED ORDER — SODIUM CHLORIDE 0.9 % IV SOLN: INTRAVENOUS | Status: DC | PRN

## 2020-10-31 MED ORDER — FAMOTIDINE IN NACL 20-0.9 MG/50ML-% IV SOLN: 20.0000 mg | Freq: Once | INTRAVENOUS | Status: DC | PRN

## 2020-10-31 MED ORDER — METHYLPREDNISOLONE SODIUM SUCC 125 MG IJ SOLR: 125.0000 mg | Freq: Once | INTRAMUSCULAR | Status: DC | PRN

## 2020-10-31 MED ORDER — DIPHENHYDRAMINE HCL 50 MG/ML IJ SOLN: 50.0000 mg | Freq: Once | INTRAMUSCULAR | Status: DC | PRN

## 2020-10-31 MED ORDER — EPINEPHRINE 0.3 MG/0.3ML IJ SOAJ: 0.3000 mg | Freq: Once | INTRAMUSCULAR | Status: DC | PRN

## 2020-10-31 MED ORDER — SOTROVIMAB 500 MG/8ML IV SOLN
500.0000 mg | Freq: Once | INTRAVENOUS | Status: AC
  Administered 2020-10-31: 500 mg via INTRAVENOUS

## 2020-10-31 NOTE — Discharge Instructions (Signed)
10 Things You Can Do to Manage Your COVID-19 Symptoms at Home If you have possible or confirmed COVID-19: 1. Stay home except to get medical care. 2. Monitor your symptoms carefully. If your symptoms get worse, call your healthcare provider immediately. 3. Get rest and stay hydrated. 4. If you have a medical appointment, call the healthcare provider ahead of time and tell them that you have or may have COVID-19. 5. For medical emergencies, call 911 and notify the dispatch personnel that you have or may have COVID-19. 6. Cover your cough and sneezes with a tissue or use the inside of your elbow. 7. Wash your hands often with soap and water for at least 20 seconds or clean your hands with an alcohol-based hand sanitizer that contains at least 60% alcohol. 8. As much as possible, stay in a specific room and away from other people in your home. Also, you should use a separate bathroom, if available. If you need to be around other people in or outside of the home, wear a mask. 9. Avoid sharing personal items with other people in your household, like dishes, towels, and bedding. 10. Clean all surfaces that are touched often, like counters, tabletops, and doorknobs. Use household cleaning sprays or wipes according to the label instructions. [email or web address] 03/13/2020 This information is not intended to replace advice given to you by your health care provider. Make sure you discuss any questions you have with your health care provider. Document Revised: 06/29/2020 Document Reviewed: 06/29/2020 Elsevier Patient Education  2021 [employer].     What types of side effects do monoclonal antibody drugs cause?  Common side effects  In general, the more common side effects caused by monoclonal antibody drugs include: . Allergic reactions, such as hives or itching . Flu-like signs and symptoms, including chills, fatigue, fever, and muscle aches and pains . Nausea, vomiting . Diarrhea . Skin  rashes . Low blood pressure   The CDC is recommending patients who receive monoclonal antibody treatments wait at least 90 days before being vaccinated.  Currently, there are no data on the safety and efficacy of mRNA COVID-19 vaccines in persons who received monoclonal antibodies or convalescent plasma as part of COVID-19 treatment. Based on the estimated half-life of such therapies as well as evidence suggesting that reinfection is uncommon in the 90 days after initial infection, vaccination should be deferred for at least 90 days, as a precautionary measure until additional information becomes available, to avoid interference of the antibody treatment with vaccine-induced immune responses.    If you have any questions or concerns after the infusion please call the Advanced Practice Provider on call at [phone]. This number is ONLY intended for your use regarding questions or concerns about the infusion post-treatment side-effects.  Please do not provide this number to others for use. For return to work notes please contact your primary care provider.   If someone you know is interested in receiving treatment please have them contact their MD for a referral or visit [email or web address]  What types of side effects do monoclonal antibody drugs cause?  Common side effects  In general, the more common side effects caused by monoclonal antibody drugs include: . Allergic reactions, such as hives or itching . Flu-like signs and symptoms, including chills, fatigue, fever, and muscle aches and pains . Nausea, vomiting . Diarrhea . Skin rashes . Low blood pressure

## 2020-10-31 NOTE — Progress Notes (Signed)
Diagnosis: COVID-19  Physician: Dr. Patrick Wright  Procedure: [facility] Med: Sotrovimab infusion - Provided patient with sotrovimab fact sheet for patients, parents, and caregivers prior to infusion.   Complications: No immediate complications noted  Discharge: Discharged home    

## 2020-10-31 NOTE — Progress Notes (Signed)
I connected by phone with Morgan [name] on 10/31/2020 at 11:04 AM to discuss the potential use of a new treatment for mild to moderate COVID-19 viral infection in non-hospitalized patients.  This patient is a 70 y.o. female that meets the FDA criteria for Emergency Use Authorization of COVID monoclonal antibody sotrovimab.  Has a (+) direct SARS-CoV-2 viral test result  Has mild or moderate COVID-19   Is NOT hospitalized due to COVID-19  Is within 10 days of symptom onset  Has at least one of the high risk factor(s) for progression to severe COVID-19 and/or hospitalization as defined in EUA.  Specific high risk criteria : Neurodevelopmental disorder   I have spoken and communicated the following to the patient or parent/caregiver regarding COVID monoclonal antibody treatment:  1. FDA has authorized the emergency use for the treatment of mild to moderate COVID-19 in adults and pediatric patients with positive results of direct SARS-CoV-2 viral testing who are 22 years of age and older weighing at least 40 kg, and who are at high risk for progressing to severe COVID-19 and/or hospitalization.  2. The significant known and potential risks and benefits of COVID monoclonal antibody, and the extent to which such potential risks and benefits are unknown.  3. Information on available alternative treatments and the risks and benefits of those alternatives, including clinical trials.  4. Patients treated with COVID monoclonal antibody should continue to self-isolate and use infection control measures (e.g., wear mask, isolate, social distance, avoid sharing personal items, clean and disinfect "high touch" surfaces, and frequent handwashing) according to CDC guidelines.   5. The patient or parent/caregiver has the option to accept or refuse COVID monoclonal antibody treatment.  After reviewing this information with the patient, the patient has agreed to receive one of the available covid 19  monoclonal antibodies and will be provided an appropriate fact sheet prior to infusion.  Sx onset 3/2 , will bring copy of test . Vaccinated x 3 , Severe parkinsons . ++sx  [name], NP 10/31/2020 11:04 AM

## 2020-10-31 NOTE — Progress Notes (Signed)
Patient reviewed Fact Sheet for Patients, Parents, and Caregivers for Emergency Use Authorization (EUA) of sotrovimab for the Treatment of Coronavirus. Patient also reviewed and is agreeable to the estimated cost of treatment. Patient is agreeable to proceed.   

## 2020-11-02 ENCOUNTER — Telehealth: Payer: Self-pay | Admitting: Neurology

## 2020-11-02 NOTE — Telephone Encounter (Signed)
Patient called in about needing a PA for the Rozerem -I will submit PA for this.   But husband got on the phone and wanted a msg to get back to Dr. Carles Collet that the Rytary did not work for her.  She was currently taking the Cinemet Carbidopa Levodopa half about every 2 hours. She is also still taking the mirapex in the AM. She is doing better. She is not falling.   He said the typical routine of her medication falls to this schedule.   1/2 tab @ 630AM and then another @ 730AM 1/2 tab @ 930AM  And then she is usually good until the afternoon when she starts taking 1/2 tabs again.   One day she took 1 AM dose and forgot to take any others so then she took a full tab at night and started falling. But that is the only time she has fallen.

## 2020-11-02 NOTE — Telephone Encounter (Signed)
Update her med list to reflect and find out how many tablets of carbidopa/levodopa she goes through in a day.

## 2020-11-02 NOTE — Telephone Encounter (Signed)
Spoke with   Carbidopa levodopa half tab when she gets up at 6:30am Half tab about a hour fifteen to hour and a half later 7:30am  Then another hour about 2 hours later half tab Then half tab every 2 hours until 7:30pm  He states the patient is taking about 5 pills total a day.   Spouse states the patient can manage the halves better.   The Rytary did not help the patient and she was miserable.  Spouse wants to know if you can give the patient something stronger for her insomnia.

## 2020-11-03 ENCOUNTER — Encounter: Payer: Self-pay | Admitting: Neurology

## 2020-11-03 NOTE — Telephone Encounter (Signed)
Spoke with spouse who states Dr [name] needs to Probation officer justificatioin for the Rozerem so the patient can get it. I advised patients spouse that you do not treat sleep and for them to follow up with the pcp. He again repeated that he is confused as to why Dr [name] can not just write a justification to the insurance so the patient can continue to get the medication.    Spoke with Dr [name] who states we are working on the prior auth but the patient is on the highest dose of the Rozerem. She states that is the highest dose and the medication can not be increased any higher. She states if anything else is needed for sleep the patient will need to contact their pcp for sleep medication. Dr [name] recommended I send a email to patient and not to call back.

## 2020-11-03 NOTE — Progress Notes (Signed)
Received fax approval valid from 10/03/20 to 11/02/21.

## 2020-11-03 NOTE — Telephone Encounter (Signed)
Have her f/u with PCP re: insomnia.  The rozerem, while its a sleep drug, is really part of tx for her RLS and is at max dose

## 2020-11-10 DIAGNOSIS — E559 Vitamin D deficiency, unspecified: Secondary | ICD-10-CM | POA: Diagnosis not present

## 2020-11-10 DIAGNOSIS — Z1389 Encounter for screening for other disorder: Secondary | ICD-10-CM | POA: Diagnosis not present

## 2020-11-10 DIAGNOSIS — Z0000 Encounter for general adult medical examination without abnormal findings: Secondary | ICD-10-CM | POA: Diagnosis not present

## 2020-11-10 DIAGNOSIS — G20 Parkinson's disease: Secondary | ICD-10-CM | POA: Diagnosis not present

## 2020-11-10 DIAGNOSIS — E785 Hyperlipidemia, unspecified: Secondary | ICD-10-CM | POA: Diagnosis not present

## 2020-11-10 DIAGNOSIS — F5101 Primary insomnia: Secondary | ICD-10-CM | POA: Diagnosis not present

## 2020-11-10 DIAGNOSIS — F419 Anxiety disorder, unspecified: Secondary | ICD-10-CM | POA: Diagnosis not present

## 2020-11-10 DIAGNOSIS — I10 Essential (primary) hypertension: Secondary | ICD-10-CM | POA: Diagnosis not present

## 2020-11-10 DIAGNOSIS — M797 Fibromyalgia: Secondary | ICD-10-CM | POA: Diagnosis not present

## 2020-11-24 ENCOUNTER — Other Ambulatory Visit: Payer: Self-pay | Admitting: Neurology

## 2020-11-24 NOTE — Telephone Encounter (Signed)
Rx(s) sent to pharmacy electronically.  

## 2020-12-04 DIAGNOSIS — H353122 Nonexudative age-related macular degeneration, left eye, intermediate dry stage: Secondary | ICD-10-CM | POA: Diagnosis not present

## 2020-12-04 DIAGNOSIS — H52203 Unspecified astigmatism, bilateral: Secondary | ICD-10-CM | POA: Diagnosis not present

## 2020-12-04 DIAGNOSIS — H353213 Exudative age-related macular degeneration, right eye, with inactive scar: Secondary | ICD-10-CM | POA: Diagnosis not present

## 2021-01-26 ENCOUNTER — Other Ambulatory Visit: Payer: Self-pay | Admitting: Neurology

## 2021-01-28 ENCOUNTER — Other Ambulatory Visit: Payer: Self-pay | Admitting: Neurology

## 2021-01-28 ENCOUNTER — Telehealth: Payer: Self-pay

## 2021-01-28 NOTE — Telephone Encounter (Signed)
Patient is requesting Carbodopa/Levodopa refill. I see she was given a sample at her last appt 10/01/20 and in your note it states patient was taking it 1.5 /1.5 /1 tablets. Wanting to double check with you on how to send rx

## 2021-01-28 NOTE — Telephone Encounter (Signed)
Patient is requesting Carb

## 2021-01-28 NOTE — Telephone Encounter (Signed)
You will need to call husband/pt.  They stopped the rytary and changed her med all around and back to carbidopa/levodopa 25/100. I think that she is on the regular IR and not the CR but don't know the dosing schedule they are taking at all

## 2021-01-29 NOTE — Telephone Encounter (Signed)
Line busy at 321

## 2021-01-29 NOTE — Telephone Encounter (Signed)
No answer, needs confirmation of acutally medication and doses with times...9:52 01/29/2021

## 2021-01-29 NOTE — Telephone Encounter (Signed)
No answer at 01/29/2021 at 10:30am.

## 2021-02-01 NOTE — Telephone Encounter (Signed)
Left message for patient to contact office with directions and correct dosing.

## 2021-02-02 NOTE — Telephone Encounter (Signed)
Patient reached out on mychart in regards to Christy's call. Informed patient per Dr. Carles Collet about clarification needed. See mychart message.

## 2021-02-24 NOTE — Progress Notes (Signed)
Assessment/Plan:    1.  Parkinsons Disease             - Continue carbidopa/levodopa 25/100, 1 tablet every 2 hours (about 7-8 per day)             -Continue pramipexole ER, 0.75 mg daily.  They asked about increasing this and I don't want to do that due to age and hx of compulsive behavior on higher dosages.             -Congratulated her on the exercise with [facility].  -they asked about inbrija.  Samples provided and signed forms today.  -May be a good candidate for entacapone or opicapone in the future.  -Discussed surgical interventions.  -Discussed with patient my concerns about being on benzodiazepines, particularly 2 different benzodiazepines, not only in the elderly population, but in the Parkinson's population.  This certainly increases risk for falls and confusion.  Would like to see her not on these medications. 2.  Fibromyalgia             -On Cymbalta             -On gabapentin, 400 mg 3 times daily 3.  History of melanoma             -See dermatology regularly, given the fact that she has had melanoma and Parkinson's increases risk for melanoma. 4.  Insomnia, with RBD             -On Rozerem.  -On alprazolam, 0.25 mg and klonopin, 1 mg, at bedtime by primary care.  Discussed with her today.  States that she is off of klonopin.    Subjective:   Morgan [name] was seen today in follow up for Parkinsons disease.  My previous records were reviewed prior to todays visit as well as outside records available to me.   Pt with husband who supplements history.  Last visit, the patient wanted to change her levodopa version because she thought it caused her to fall.  We changed her to [place], but ultimately she did not think that that medication helped and she went back to traditional immediate release levodopa.  She has not had falls.  She has not had lightheadedness or near syncope.  PDMP is reviewed.  June 1, she received #90 alprazolam 0.25 mg as well as clonazepam 1 mg (#30).  No  falls.  Taking levodopa q 2 hours and dyskinesia better and doing better with it.  They want to discuss inbrija  Current prescribed movement disorder medications: carbidopa/levodopa 25/100, 1 tablet every 2 hours starting at 6-7am (getting about 7-8 in per day) Pramipexole ER, 0.75 mg daily (compulsive behaviors at higher dosages) Cymbalta Gabapentin, 400 mg 3 times per day Rozerem, 8 mg q hs    PREVIOUS MEDICATIONS: Mirapex and azilect; levodopa;; requip; amantadine (stopped for SE - ?what); clonazepam (taken off when primary care started alprazolam); rytary (didn't think it helped)  ALLERGIES:   Allergies  Allergen Reactions   Other Anaphylaxis    "Anti Seizure Medications"   Phenergan [Promethazine Hcl] Other (See Comments)    Makes Parkinson worse   Sporanox [Itraconazole] Hives   Tylenol [Acetaminophen] Other (See Comments)    "I just get weird"    CURRENT MEDICATIONS:  Outpatient Encounter Medications as of 02/26/2021  Medication Sig   ALPRAZolam (XANAX) 0.25 MG tablet Take 0.25 mg by mouth daily as needed for anxiety.    aspirin 325 MG tablet Take 325-650 mg by mouth  daily as needed (for headaches/pain.).    atorvastatin (LIPITOR) 10 MG tablet Take 10 mg by mouth at bedtime.   CALCIUM CITRATE PO Take 1 tablet by mouth daily. BariMelts Calcium Citrate   carbidopa-levodopa (SINEMET IR) 25-100 MG tablet Take 1 tablet by mouth every 2 (two) hours.   Cholecalciferol (VITAMIN D-3) 5000 units TABS Take 5,000 Units by mouth 2 (two) times daily.   clonazePAM (KLONOPIN) 1 MG tablet Take 1 tablet by mouth daily.   Coenzyme Q10-Vitamin E (QUNOL ULTRA COQ10 PO) Take 1 capsule by mouth daily.   CYMBALTA 60 MG capsule TAKE 1 CAPSULE DAILY   gabapentin (NEURONTIN) 400 MG capsule TAKE 1 CAPSULE 3 TIMES A   DAY   methocarbamol (ROBAXIN) 750 MG tablet Take 750 mg by mouth every 6 (six) hours as needed.   Multiple Vitamins-Minerals (PRESERVISION AREDS 2 PO) Take 1 tablet by mouth 2 (two) times  daily.   Pramipexole Dihydrochloride 0.75 MG TB24 TAKE 1 TABLET DAILY   raloxifene (EVISTA) 60 MG tablet Take 60 mg by mouth daily.   ramelteon (ROZEREM) 8 MG tablet TAKE 1 TABLET AT BEDTIME   [DISCONTINUED] Carbidopa-Levodopa ER (RYTARY) 36.25-145 MG CPCR Take 2 at 8am/ 2 at 1pm and 1 at 6 pm Samples given  Lot: [id number]  Exp: 03/2021 2 boxes give of 100 tabs   No facility-administered encounter medications on file as of 02/26/2021.    Objective:   PHYSICAL EXAMINATION:    VITALS:   Vitals:   02/26/21 1301  BP: 139/85  Pulse: 96  Resp: 18  SpO2: 100%  Weight: 166 lb (75.3 kg)  Height: 5\' 5"  (1.651 m)     GEN:  The patient appears stated age and is in NAD. HEENT:  Normocephalic, atraumatic.  The mucous membranes are moist. The superficial temporal arteries are without ropiness or tenderness.   Neurological examination:  Orientation: The patient is alert and oriented x3. Cranial nerves: There is good facial symmetry with mild facial hypomimia. The speech is fluent and clear. Soft palate rises symmetrically and there is no tongue deviation. Hearing is intact to conversational tone. Sensation: Sensation is intact to light touch throughout Motor: Strength is at least antigravity x4.  Movement examination: Tone: There is normal tone in the upper and lower extremities. Abnormal movements: Mild to mod dyskinesia of the LE, worse in the legs and worse with distraction Gait and Station: The patient has easily difficulty arising out of a deep-seated chair without the use of the hands. The patient's stride length is good and balance is better but not perfect  I have reviewed and interpreted the following labs independently    Chemistry      Component Value Date/Time   NA 140 02/05/2018 1325   K 3.7 02/05/2018 1325   CL 103 02/05/2018 1325   CO2 28 02/05/2018 1325   BUN 15 02/05/2018 1325   CREATININE 0.88 02/05/2018 1325      Component Value Date/Time   CALCIUM 9.2  02/05/2018 1325   ALKPHOS 82 09/03/2008 0905   AST 27 09/03/2008 0905   ALT 13 09/03/2008 0905   BILITOT 0.7 09/03/2008 0905       Lab Results  Component Value Date   WBC 8.3 02/05/2018   HGB 13.3 02/05/2018   HCT 41.1 02/05/2018   MCV 89.3 02/05/2018   PLT 223 02/05/2018    Lab Results  Component Value Date   TSH 0.80 01/15/2013     Total time spent on today's visit  was 30 minutes, including both face-to-face time and nonface-to-face time.  Time included that spent on review of records (prior notes available to me/labs/imaging if pertinent), discussing treatment and goals, answering patient's questions and coordinating care.  Cc:  [name], MD

## 2021-02-26 ENCOUNTER — Other Ambulatory Visit: Payer: Self-pay

## 2021-02-26 ENCOUNTER — Encounter: Payer: Self-pay | Admitting: Neurology

## 2021-02-26 ENCOUNTER — Ambulatory Visit (INDEPENDENT_AMBULATORY_CARE_PROVIDER_SITE_OTHER): Payer: Medicare Other | Admitting: Neurology

## 2021-02-26 VITALS — BP 139/85 | HR 96 | Resp 18 | Ht 65.0 in | Wt 166.0 lb

## 2021-02-26 DIAGNOSIS — G20 Parkinson's disease: Secondary | ICD-10-CM | POA: Diagnosis not present

## 2021-02-26 DIAGNOSIS — G249 Dystonia, unspecified: Secondary | ICD-10-CM

## 2021-02-26 MED ORDER — RAMELTEON 8 MG PO TABS
8.0000 mg | ORAL_TABLET | Freq: Every day | ORAL | 1 refills | Status: DC
Start: 2021-02-26 — End: 2021-06-23

## 2021-02-26 MED ORDER — GABAPENTIN 400 MG PO CAPS
400.0000 mg | ORAL_CAPSULE | Freq: Three times a day (TID) | ORAL | 1 refills | Status: DC
Start: 2021-02-26 — End: 2021-06-23

## 2021-02-26 MED ORDER — CARBIDOPA-LEVODOPA 25-100 MG PO TABS: ORAL_TABLET | ORAL | 1 refills | Status: DC

## 2021-02-26 NOTE — Patient Instructions (Signed)
We are going to start inbrija.  Remember that TWO capsules is ONE dosage (never inhale just one capsule).  You can inhale the capsules as needed up to 5 times per day, separated by 2 hour intervals.  Many patients use this right when the wake up to help with first morning "on" and then as needed during the day.  You should take a sip of water prior to using the inhaler to avoid side effects.  It may generate some cough right when you use it and that is normal.  There are nurse educators available to help you with this device.  You can call [phone] and they will set you up with a nurse educator to assist you for free of charge.  They are available 8am-8pm Monday-Friday.    

## 2021-05-02 ENCOUNTER — Other Ambulatory Visit: Payer: Self-pay | Admitting: Neurology

## 2021-05-04 ENCOUNTER — Other Ambulatory Visit: Payer: Self-pay

## 2021-05-18 DIAGNOSIS — G20 Parkinson's disease: Secondary | ICD-10-CM | POA: Diagnosis not present

## 2021-05-18 DIAGNOSIS — M797 Fibromyalgia: Secondary | ICD-10-CM | POA: Diagnosis not present

## 2021-05-18 DIAGNOSIS — Z79899 Other long term (current) drug therapy: Secondary | ICD-10-CM | POA: Diagnosis not present

## 2021-06-23 ENCOUNTER — Other Ambulatory Visit: Payer: Self-pay | Admitting: Neurology

## 2021-06-23 DIAGNOSIS — G249 Dystonia, unspecified: Secondary | ICD-10-CM

## 2021-06-23 DIAGNOSIS — G20 Parkinson's disease: Secondary | ICD-10-CM

## 2021-07-13 DIAGNOSIS — Z85820 Personal history of malignant melanoma of skin: Secondary | ICD-10-CM | POA: Diagnosis not present

## 2021-07-13 DIAGNOSIS — D229 Melanocytic nevi, unspecified: Secondary | ICD-10-CM | POA: Diagnosis not present

## 2021-07-13 DIAGNOSIS — Z23 Encounter for immunization: Secondary | ICD-10-CM | POA: Diagnosis not present

## 2021-07-13 DIAGNOSIS — D1721 Benign lipomatous neoplasm of skin and subcutaneous tissue of right arm: Secondary | ICD-10-CM | POA: Diagnosis not present

## 2021-07-13 DIAGNOSIS — D1801 Hemangioma of skin and subcutaneous tissue: Secondary | ICD-10-CM | POA: Diagnosis not present

## 2021-07-13 DIAGNOSIS — L820 Inflamed seborrheic keratosis: Secondary | ICD-10-CM | POA: Diagnosis not present

## 2021-07-13 DIAGNOSIS — L578 Other skin changes due to chronic exposure to nonionizing radiation: Secondary | ICD-10-CM | POA: Diagnosis not present

## 2021-07-13 DIAGNOSIS — Z808 Family history of malignant neoplasm of other organs or systems: Secondary | ICD-10-CM | POA: Diagnosis not present

## 2021-07-13 DIAGNOSIS — L821 Other seborrheic keratosis: Secondary | ICD-10-CM | POA: Diagnosis not present

## 2021-09-10 DIAGNOSIS — Z20822 Contact with and (suspected) exposure to covid-19: Secondary | ICD-10-CM | POA: Diagnosis not present

## 2021-09-22 DIAGNOSIS — Z1231 Encounter for screening mammogram for malignant neoplasm of breast: Secondary | ICD-10-CM | POA: Diagnosis not present

## 2021-09-23 NOTE — Progress Notes (Signed)
Assessment/Plan:    1.  Parkinsons Disease             - Continue carbidopa/levodopa 25/100, 1 tablet every 2 hours (about 7-8 per day)             -Continue pramipexole ER, 0.75 mg daily.  They asked about increasing this and I don't want to do that due to age and hx of compulsive behavior on higher dosages.  -May be a good candidate for entacapone or opicapone in the future.  -Discussed DBS.  She is not interested.    -exercising at [facility]  -She has Inbrija, but rarely uses it.  -We discussed the [facility] app but turns out they don't have an apple device.  -She may need to start using a walker instead of her cane.  2.  Fibromyalgia             -On Cymbalta             -On gabapentin, 400 mg 3 times daily 3.  History of melanoma             -See dermatology regularly, given the fact that she has had melanoma and Parkinson's increases risk for melanoma. 4.  Insomnia, with RBD             -On Rozerem.  -On alprazolam, 0.25 mg at bedtime.  Primary care prescribing.  She used to be on clonazepam with this, but that was discontinued due to being on 2 different benzodiazepines at night.  Subjective:   Morgan [name] was seen today in follow up for Parkinsons disease.  My previous records were reviewed prior to todays visit as well as outside records available to me.   Pt with husband who supplements history.  Last visit, the patient wanted to discuss Inbrija.  She was given samples of that and signed forms for that medication.  She reports today that she only used it one time.  It helped but she felt she just didn't need it a lot.  One fall but she doesn't remember the nature of it.  Didn't get hurt.  No hallucinations.  Doing well on rozerem with her insomnia with RBD.  Tried and failed others.  Doing training 2 days/week at [facility] with Alfonse Spruce.  Current prescribed movement disorder medications: carbidopa/levodopa 25/100, 1 tablet every 2 hours starting at 6-7am (getting about 7-8 in per  day) Pramipexole ER, 0.75 mg daily (compulsive behaviors at higher dosages) Cymbalta Gabapentin, 400 mg 3 times per day Rozerem, 8 mg q hs  Inbrija (started last visit)   PREVIOUS MEDICATIONS: Mirapex and azilect; levodopa;; requip; amantadine (stopped for SE - ?what); clonazepam (taken off when primary care started alprazolam); rytary (didn't think it helped); Inbrija  ALLERGIES:   Allergies  Allergen Reactions   Other Anaphylaxis    "Anti Seizure Medications"   Phenergan [Promethazine Hcl] Other (See Comments)    Makes Parkinson worse   Sporanox [Itraconazole] Hives   Tylenol [Acetaminophen] Other (See Comments)    "I just get weird"    CURRENT MEDICATIONS:  Outpatient Encounter Medications as of 09/24/2021  Medication Sig   ALPRAZolam (XANAX) 0.25 MG tablet Take 0.25 mg by mouth daily as needed for anxiety.    aspirin 325 MG tablet Take 325-650 mg by mouth daily as needed (for headaches/pain.).    atorvastatin (LIPITOR) 10 MG tablet Take 10 mg by mouth at bedtime.   CALCIUM CITRATE PO Take 1 tablet by mouth daily. BariMelts Calcium  Citrate   carbidopa-levodopa (SINEMET IR) 25-100 MG tablet TAKE 1 TABLET EVERY 2 HOURSUP TO 8 TABLETS PER DAY   Cholecalciferol (VITAMIN D-3) 5000 units TABS Take 5,000 Units by mouth 2 (two) times daily.   clonazePAM (KLONOPIN) 1 MG tablet Take 1 tablet by mouth daily.   Coenzyme Q10-Vitamin E (QUNOL ULTRA COQ10 PO) Take 1 capsule by mouth daily.   CYMBALTA 60 MG capsule TAKE 1 CAPSULE DAILY   gabapentin (NEURONTIN) 400 MG capsule TAKE 1 CAPSULE 3 TIMES A   DAY   methocarbamol (ROBAXIN) 750 MG tablet Take 750 mg by mouth every 6 (six) hours as needed.   Multiple Vitamins-Minerals (PRESERVISION AREDS 2 PO) Take 1 tablet by mouth 2 (two) times daily.   Pramipexole Dihydrochloride 0.75 MG TB24 TAKE 1 TABLET DAILY   raloxifene (EVISTA) 60 MG tablet Take 60 mg by mouth daily.   ramelteon (ROZEREM) 8 MG tablet TAKE 1 TABLET AT BEDTIME   No  facility-administered encounter medications on file as of 09/24/2021.    Objective:   PHYSICAL EXAMINATION:    VITALS:   Vitals:   09/24/21 1306  BP: 136/89  Pulse: 96  SpO2: 99%  Weight: 161 lb (73 kg)      GEN:  The patient appears stated age and is in NAD. HEENT:  Normocephalic, atraumatic.  The mucous membranes are moist. The superficial temporal arteries are without ropiness or tenderness. Cardiovascular: Regular rate rhythm Lungs: Clear to auscultation bilaterally  Neurological examination:  Orientation: The patient is alert and oriented x3. Cranial nerves: There is good facial symmetry with mild facial hypomimia. The speech is fluent and clear. Soft palate rises symmetrically and there is no tongue deviation. Hearing is intact to conversational tone. Sensation: Sensation is intact to light touch throughout Motor: Strength is at least antigravity x4.  Movement examination: Tone: There is normal tone in the upper and lower extremities. Abnormal movements: Mild to mod dyskinesia of the LLE>RLE Gait and Station: The patient has easily difficulty arising out of a deep-seated chair without the use of the hands. The patient's stride length is decreased with freezing and start hesitation.  She is more unstable than I have seen her in the past.  I have reviewed and interpreted the following labs independently    Chemistry      Component Value Date/Time   NA 140 02/05/2018 1325   K 3.7 02/05/2018 1325   CL 103 02/05/2018 1325   CO2 28 02/05/2018 1325   BUN 15 02/05/2018 1325   CREATININE 0.88 02/05/2018 1325      Component Value Date/Time   CALCIUM 9.2 02/05/2018 1325   ALKPHOS 82 09/03/2008 0905   AST 27 09/03/2008 0905   ALT 13 09/03/2008 0905   BILITOT 0.7 09/03/2008 0905       Lab Results  Component Value Date   WBC 8.3 02/05/2018   HGB 13.3 02/05/2018   HCT 41.1 02/05/2018   MCV 89.3 02/05/2018   PLT 223 02/05/2018    Lab Results  Component Value  Date   TSH 0.80 01/15/2013     Total time spent on today's visit was 31 minutes, including both face-to-face time and nonface-to-face time.  Time included that spent on review of records (prior notes available to me/labs/imaging if pertinent), discussing treatment and goals, answering patient's questions and coordinating care.  Cc:  [name], MD

## 2021-09-24 ENCOUNTER — Other Ambulatory Visit: Payer: Self-pay

## 2021-09-24 ENCOUNTER — Ambulatory Visit (INDEPENDENT_AMBULATORY_CARE_PROVIDER_SITE_OTHER): Payer: Medicare Other | Admitting: Neurology

## 2021-09-24 ENCOUNTER — Encounter: Payer: Self-pay | Admitting: Neurology

## 2021-09-24 VITALS — BP 136/89 | HR 96 | Ht 65.0 in | Wt 161.0 lb

## 2021-09-24 DIAGNOSIS — G249 Dystonia, unspecified: Secondary | ICD-10-CM | POA: Diagnosis not present

## 2021-09-24 DIAGNOSIS — G20 Parkinson's disease: Secondary | ICD-10-CM

## 2021-09-24 MED ORDER — AMBULATORY NON FORMULARY MEDICATION: 0 refills | Status: DC

## 2021-09-27 ENCOUNTER — Telehealth: Payer: Self-pay

## 2021-09-27 NOTE — Telephone Encounter (Signed)
New message   This request has received an approval. View the bottom of the request for an electronic copy of the approval letter.  [name] Key[employer]axter Flattery - PA Case ID: [id number] Need help? Call us at [phone] Outcome Approvedtoday Your PA request has been approved. Additional information will be provided in the approval communication. (Message 1145) Drug Rozerem 8MG  tablets Form [employer] Electronic PA Form [phone] NCPDP)  Message: The Prior Authorization request has been approved for Rozerem 8MG  OR TABS. The authorization is valid from 08/28/2021 through 09/27/2022. A letter of explanation will also be mailed to the patient.

## 2021-10-17 ENCOUNTER — Encounter (HOSPITAL_COMMUNITY): Payer: Self-pay

## 2021-10-17 ENCOUNTER — Emergency Department (HOSPITAL_COMMUNITY): Payer: Medicare Other

## 2021-10-17 ENCOUNTER — Inpatient Hospital Stay (HOSPITAL_COMMUNITY)
Admission: EM | Admit: 2021-10-17 | Discharge: 2021-10-21 | DRG: 853 | Disposition: A | Discharge status: Home Health | Payer: Medicare Other | Attending: Internal Medicine | Admitting: Internal Medicine

## 2021-10-17 ENCOUNTER — Observation Stay (HOSPITAL_COMMUNITY): Payer: Medicare Other

## 2021-10-17 ENCOUNTER — Other Ambulatory Visit: Payer: Self-pay

## 2021-10-17 DIAGNOSIS — I447 Left bundle-branch block, unspecified: Secondary | ICD-10-CM | POA: Diagnosis not present

## 2021-10-17 DIAGNOSIS — K5730 Diverticulosis of large intestine without perforation or abscess without bleeding: Secondary | ICD-10-CM | POA: Diagnosis not present

## 2021-10-17 DIAGNOSIS — E860 Dehydration: Secondary | ICD-10-CM | POA: Diagnosis not present

## 2021-10-17 DIAGNOSIS — G20 Parkinson's disease: Secondary | ICD-10-CM | POA: Diagnosis not present

## 2021-10-17 DIAGNOSIS — Z888 Allergy status to other drugs, medicaments and biological substances status: Secondary | ICD-10-CM

## 2021-10-17 DIAGNOSIS — K8020 Calculus of gallbladder without cholecystitis without obstruction: Secondary | ICD-10-CM | POA: Diagnosis not present

## 2021-10-17 DIAGNOSIS — A419 Sepsis, unspecified organism: Secondary | ICD-10-CM | POA: Diagnosis present

## 2021-10-17 DIAGNOSIS — R1030 Lower abdominal pain, unspecified: Secondary | ICD-10-CM

## 2021-10-17 DIAGNOSIS — Z85820 Personal history of malignant melanoma of skin: Secondary | ICD-10-CM

## 2021-10-17 DIAGNOSIS — I517 Cardiomegaly: Secondary | ICD-10-CM | POA: Diagnosis not present

## 2021-10-17 DIAGNOSIS — R0902 Hypoxemia: Secondary | ICD-10-CM | POA: Diagnosis not present

## 2021-10-17 DIAGNOSIS — R1084 Generalized abdominal pain: Secondary | ICD-10-CM | POA: Diagnosis not present

## 2021-10-17 DIAGNOSIS — R5381 Other malaise: Secondary | ICD-10-CM

## 2021-10-17 DIAGNOSIS — B9620 Unspecified Escherichia coli [E. coli] as the cause of diseases classified elsewhere: Secondary | ICD-10-CM | POA: Diagnosis present

## 2021-10-17 DIAGNOSIS — Z800 Family history of malignant neoplasm of digestive organs: Secondary | ICD-10-CM | POA: Diagnosis not present

## 2021-10-17 DIAGNOSIS — N136 Pyonephrosis: Secondary | ICD-10-CM | POA: Diagnosis not present

## 2021-10-17 DIAGNOSIS — N3090 Cystitis, unspecified without hematuria: Secondary | ICD-10-CM | POA: Diagnosis not present

## 2021-10-17 DIAGNOSIS — R7881 Bacteremia: Secondary | ICD-10-CM | POA: Diagnosis not present

## 2021-10-17 DIAGNOSIS — R410 Disorientation, unspecified: Secondary | ICD-10-CM | POA: Diagnosis not present

## 2021-10-17 DIAGNOSIS — G9341 Metabolic encephalopathy: Secondary | ICD-10-CM | POA: Diagnosis not present

## 2021-10-17 DIAGNOSIS — N390 Urinary tract infection, site not specified: Secondary | ICD-10-CM | POA: Diagnosis present

## 2021-10-17 DIAGNOSIS — N12 Tubulo-interstitial nephritis, not specified as acute or chronic: Secondary | ICD-10-CM | POA: Diagnosis not present

## 2021-10-17 DIAGNOSIS — R030 Elevated blood-pressure reading, without diagnosis of hypertension: Secondary | ICD-10-CM

## 2021-10-17 DIAGNOSIS — E785 Hyperlipidemia, unspecified: Secondary | ICD-10-CM | POA: Diagnosis not present

## 2021-10-17 DIAGNOSIS — R000 Tachycardia, unspecified: Secondary | ICD-10-CM | POA: Diagnosis not present

## 2021-10-17 DIAGNOSIS — E876 Hypokalemia: Secondary | ICD-10-CM | POA: Diagnosis present

## 2021-10-17 DIAGNOSIS — I10 Essential (primary) hypertension: Secondary | ICD-10-CM | POA: Diagnosis not present

## 2021-10-17 DIAGNOSIS — Z79899 Other long term (current) drug therapy: Secondary | ICD-10-CM | POA: Diagnosis not present

## 2021-10-17 DIAGNOSIS — N10 Acute tubulo-interstitial nephritis: Secondary | ICD-10-CM | POA: Diagnosis present

## 2021-10-17 DIAGNOSIS — R6520 Severe sepsis without septic shock: Secondary | ICD-10-CM | POA: Diagnosis not present

## 2021-10-17 DIAGNOSIS — N3000 Acute cystitis without hematuria: Secondary | ICD-10-CM

## 2021-10-17 DIAGNOSIS — Z87891 Personal history of nicotine dependence: Secondary | ICD-10-CM | POA: Diagnosis not present

## 2021-10-17 DIAGNOSIS — A4151 Sepsis due to Escherichia coli [E. coli]: Principal | ICD-10-CM | POA: Diagnosis present

## 2021-10-17 DIAGNOSIS — N134 Hydroureter: Secondary | ICD-10-CM | POA: Diagnosis not present

## 2021-10-17 DIAGNOSIS — N201 Calculus of ureter: Secondary | ICD-10-CM | POA: Diagnosis not present

## 2021-10-17 DIAGNOSIS — Z7982 Long term (current) use of aspirin: Secondary | ICD-10-CM | POA: Diagnosis not present

## 2021-10-17 DIAGNOSIS — Z20822 Contact with and (suspected) exposure to covid-19: Secondary | ICD-10-CM | POA: Diagnosis present

## 2021-10-17 DIAGNOSIS — G20A1 Parkinson's disease without dyskinesia, without mention of fluctuations: Secondary | ICD-10-CM | POA: Diagnosis present

## 2021-10-17 DIAGNOSIS — N131 Hydronephrosis with ureteral stricture, not elsewhere classified: Secondary | ICD-10-CM | POA: Diagnosis not present

## 2021-10-17 DIAGNOSIS — R109 Unspecified abdominal pain: Secondary | ICD-10-CM | POA: Diagnosis present

## 2021-10-17 DIAGNOSIS — J80 Acute respiratory distress syndrome: Secondary | ICD-10-CM | POA: Diagnosis not present

## 2021-10-17 DIAGNOSIS — N289 Disorder of kidney and ureter, unspecified: Secondary | ICD-10-CM | POA: Diagnosis not present

## 2021-10-17 DIAGNOSIS — J9811 Atelectasis: Secondary | ICD-10-CM | POA: Diagnosis not present

## 2021-10-17 LAB — URINALYSIS, ROUTINE W REFLEX MICROSCOPIC
Bilirubin Urine: NEGATIVE
Glucose, UA: NEGATIVE mg/dL
Hgb urine dipstick: NEGATIVE
Ketones, ur: 20 mg/dL — AB
Nitrite: POSITIVE — AB
Protein, ur: 30 mg/dL — AB
Specific Gravity, Urine: 1.015 (ref 1.005–1.030)
WBC, UA: >50 WBC/hpf — ABNORMAL HIGH (ref 0–5)
pH: 6 (ref 5.0–8.0)

## 2021-10-17 LAB — CBC WITH DIFFERENTIAL/PLATELET
Abs Immature Granulocytes: 0.04 10*3/uL (ref 0.00–0.07)
Basophils Absolute: 0 10*3/uL (ref 0.0–0.1)
Basophils Relative: 0 %
Eosinophils Absolute: 0 10*3/uL (ref 0.0–0.5)
Eosinophils Relative: 0 %
HCT: 39.4 % (ref 36.0–46.0)
Hemoglobin: 12.5 g/dL (ref 12.0–15.0)
Immature Granulocytes: 0 %
Lymphocytes Relative: 7 %
Lymphs Abs: 1.1 10*3/uL (ref 0.7–4.0)
MCH: 28.6 pg (ref 26.0–34.0)
MCHC: 31.7 g/dL (ref 30.0–36.0)
MCV: 90.2 fL (ref 80.0–100.0)
Monocytes Absolute: 0.7 10*3/uL (ref 0.1–1.0)
Monocytes Relative: 5 %
Neutro Abs: 12.6 10*3/uL — ABNORMAL HIGH (ref 1.7–7.7)
Neutrophils Relative %: 88 %
Platelets: 230 10*3/uL (ref 150–400)
RBC: 4.37 MIL/uL (ref 3.87–5.11)
RDW: 13.9 % (ref 11.5–15.5)
WBC: 14.5 10*3/uL — ABNORMAL HIGH (ref 4.0–10.5)
nRBC: 0 % (ref 0.0–0.2)

## 2021-10-17 LAB — COMPREHENSIVE METABOLIC PANEL
ALT: 8 U/L (ref 0–44)
AST: 15 U/L (ref 15–41)
Albumin: 3.9 g/dL (ref 3.5–5.0)
Alkaline Phosphatase: 101 U/L (ref 38–126)
Anion gap: 6 (ref 5–15)
BUN: 13 mg/dL (ref 8–23)
CO2: 27 mmol/L (ref 22–32)
Calcium: 8.7 mg/dL — ABNORMAL LOW (ref 8.9–10.3)
Chloride: 102 mmol/L (ref 98–111)
Creatinine, Ser: 0.73 mg/dL (ref 0.44–1.00)
GFR, Estimated: >60 mL/min (ref >60)
Glucose, Bld: 121 mg/dL — ABNORMAL HIGH (ref 70–99)
Potassium: 3.2 mmol/L — ABNORMAL LOW (ref 3.5–5.1)
Sodium: 135 mmol/L (ref 135–145)
Total Bilirubin: 0.8 mg/dL (ref 0.3–1.2)
Total Protein: 7.3 g/dL (ref 6.5–8.1)

## 2021-10-17 LAB — RESP PANEL BY RT-PCR (FLU A&B, COVID) ARPGX2
Influenza A by PCR: NEGATIVE
Influenza B by PCR: NEGATIVE
SARS Coronavirus 2 by RT PCR: NEGATIVE

## 2021-10-17 LAB — APTT: aPTT: 39 seconds — ABNORMAL HIGH (ref 24–36)

## 2021-10-17 LAB — PROTIME-INR
INR: 1.2 (ref 0.8–1.2)
Prothrombin Time: 15 seconds (ref 11.4–15.2)

## 2021-10-17 LAB — MAGNESIUM: Magnesium: 2 mg/dL (ref 1.7–2.4)

## 2021-10-17 LAB — CK: Total CK: 103 U/L (ref 38–234)

## 2021-10-17 LAB — D-DIMER, QUANTITATIVE: D-Dimer, Quant: 1.21 ug/mL-FEU — ABNORMAL HIGH (ref 0.00–0.50)

## 2021-10-17 LAB — LACTIC ACID, PLASMA: Lactic Acid, Venous: 1 mmol/L (ref 0.5–1.9)

## 2021-10-17 LAB — PROCALCITONIN: Procalcitonin: <0.1 ng/mL

## 2021-10-17 LAB — PHOSPHORUS: Phosphorus: 2.6 mg/dL (ref 2.5–4.6)

## 2021-10-17 LAB — TROPONIN I (HIGH SENSITIVITY): Troponin I (High Sensitivity): 6 ng/L (ref <18)

## 2021-10-17 LAB — TSH: TSH: 0.634 u[IU]/mL (ref 0.350–4.500)

## 2021-10-17 MED ORDER — CARBIDOPA-LEVODOPA 25-100 MG PO TABS: 1.0000 | ORAL_TABLET | ORAL | Status: DC

## 2021-10-17 MED ORDER — SODIUM CHLORIDE 0.9 % IV BOLUS: 1000.0000 mL | Freq: Once | INTRAVENOUS | Status: DC

## 2021-10-17 MED ORDER — SODIUM CHLORIDE 0.9 % IV SOLN
2.0000 g | INTRAVENOUS | Status: DC
  Administered 2021-10-17 – 2021-10-21 (×4): 2 g via INTRAVENOUS
  Filled 2021-10-17 (×5): qty 20

## 2021-10-17 MED ORDER — SODIUM CHLORIDE 0.9 % IV SOLN
75.0000 mL/h | INTRAVENOUS | Status: AC
  Administered 2021-10-17: 75 mL/h via INTRAVENOUS

## 2021-10-17 MED ORDER — SODIUM CHLORIDE 0.9 % IV BOLUS (SEPSIS)
1000.0000 mL | Freq: Once | INTRAVENOUS | Status: AC
  Administered 2021-10-17: 1000 mL via INTRAVENOUS

## 2021-10-17 MED ORDER — RAMELTEON 8 MG PO TABS
8.0000 mg | ORAL_TABLET | Freq: Every day | ORAL | Status: DC
  Administered 2021-10-18 – 2021-10-20 (×3): 8 mg via ORAL
  Filled 2021-10-17 (×4): qty 1

## 2021-10-17 MED ORDER — IBUPROFEN 200 MG PO TABS
400.0000 mg | ORAL_TABLET | Freq: Four times a day (QID) | ORAL | Status: DC | PRN
  Administered 2021-10-17 – 2021-10-18 (×2): 400 mg via ORAL
  Filled 2021-10-17 (×3): qty 2

## 2021-10-17 MED ORDER — SODIUM CHLORIDE 0.9 % IV SOLN: 2.0000 g | INTRAVENOUS | Status: DC

## 2021-10-17 MED ORDER — POTASSIUM CHLORIDE 10 MEQ/100ML IV SOLN
10.0000 meq | INTRAVENOUS | Status: AC
  Administered 2021-10-17 (×2): 10 meq via INTRAVENOUS
  Filled 2021-10-17 (×2): qty 100

## 2021-10-17 MED ORDER — MIDAZOLAM HCL 2 MG/2ML IJ SOLN
1.0000 mg | Freq: Once | INTRAMUSCULAR | Status: AC | PRN
  Administered 2021-10-17: 1 mg via INTRAVENOUS

## 2021-10-17 MED ORDER — CARBIDOPA-LEVODOPA 25-100 MG PO TABS
1.0000 | ORAL_TABLET | ORAL | Status: DC
  Administered 2021-10-18 – 2021-10-21 (×23): 1 via ORAL
  Filled 2021-10-17 (×27): qty 1

## 2021-10-17 MED ORDER — MIDAZOLAM HCL 2 MG/2ML IJ SOLN
1.0000 mg | Freq: Once | INTRAMUSCULAR | Status: DC
  Filled 2021-10-17: qty 2

## 2021-10-17 MED ORDER — DULOXETINE HCL 60 MG PO CPEP
60.0000 mg | ORAL_CAPSULE | Freq: Every day | ORAL | Status: DC
  Administered 2021-10-18 – 2021-10-21 (×4): 60 mg via ORAL
  Filled 2021-10-17 (×2): qty 1
  Filled 2021-10-17: qty 2
  Filled 2021-10-17: qty 1

## 2021-10-17 MED ORDER — IOHEXOL 300 MG/ML  SOLN
80.0000 mL | Freq: Once | INTRAMUSCULAR | Status: AC | PRN
  Administered 2021-10-17: 80 mL via INTRAVENOUS

## 2021-10-17 MED ORDER — LORAZEPAM 2 MG/ML IJ SOLN
0.5000 mg | Freq: Four times a day (QID) | INTRAMUSCULAR | Status: DC | PRN
  Administered 2021-10-17 – 2021-10-19 (×2): 0.5 mg via INTRAVENOUS
  Filled 2021-10-17 (×2): qty 1

## 2021-10-17 MED ORDER — LACTATED RINGERS IV SOLN: INTRAVENOUS | Status: AC

## 2021-10-17 MED ORDER — ATORVASTATIN CALCIUM 10 MG PO TABS
10.0000 mg | ORAL_TABLET | Freq: Every morning | ORAL | Status: DC
  Administered 2021-10-18 – 2021-10-21 (×4): 10 mg via ORAL
  Filled 2021-10-17 (×5): qty 1

## 2021-10-17 NOTE — ED Notes (Signed)
Pt pulled out IV by fluids; fluids turned off and pt's arm cleaned. Pt given PRN ativan due to her agitation.

## 2021-10-17 NOTE — Assessment & Plan Note (Signed)
Chronic-stable.

## 2021-10-17 NOTE — Assessment & Plan Note (Signed)
Obtain echogram monitor on telemetry

## 2021-10-17 NOTE — ED Notes (Signed)
Pt's oxygen dropped to 88 on room air while sleeping; pt placed on 2L of oxygen and her oxygen went back up to 91

## 2021-10-17 NOTE — Assessment & Plan Note (Signed)
-   will replace and repeat in AM,  check magnesium level and replace as needed ° °

## 2021-10-17 NOTE — Progress Notes (Signed)
Elink following code sepsis °

## 2021-10-17 NOTE — ED Triage Notes (Addendum)
Per EMS- Patient is from home. Patient's husband reports dysuria, increased weakness, slight confusion, and RLQ abdominal pain that started today. Patient has a history of Parkinson's disease  Patient's husband added that the patient's urine is foul smelling x 2 days.

## 2021-10-17 NOTE — Subjective & Objective (Signed)
Pt was on a recent trip with husband o dysney They drove RV P started to complain of lower abd pain stopped eating and drinking and stopped taking her parkinson meds Became progressively more confused

## 2021-10-17 NOTE — Assessment & Plan Note (Signed)
Ordered CT to further evaluate

## 2021-10-17 NOTE — Assessment & Plan Note (Signed)
-   most likely multifactorial secondary to combination of  infection   mild dehydration secondary to decreased by mouth intake,    - Will rehydrate   - treat underlining infection   - Hold contributing medications   - if no improvement may need further imaging to evaluate for CNS pathology pathology such as MRI of the brain   - neurological exam appears to be nonfocal but patient unable to cooperate fully      - no history of liver disease

## 2021-10-17 NOTE — H&P (Addendum)
Morgan [name] OVA:[id number] DOB: 11-07-1950 DOA: 10/17/2021      PCP: [name], MD   Outpatient Specialists:   CARDS:   Dr. Sallyanne Kuster   NEurology  Dr. Carles Collet    Patient arrived to ER on 10/17/21 at 1342 Referred by Attending Toy Baker, MD   Patient coming from:    home Lives  With family    Chief Complaint:   Chief Complaint  Patient presents with   Dysuria   Abdominal Pain   Altered Mental Status    HPI: Morgan [name] is a 71 y.o. female with medical history significant of  Parkinson disease, fibrolyalgia, LBBB, HLD    Presented with   confusion and fever Pt was on a recent trip with husband o dysney They drove RV P started to complain of lower abd pain stopped eating and drinking and stopped taking her parkinson meds Became progressively more confused     Initial COVID TEST  NEGATIVE   Lab Results  Component Value Date   [place] NEGATIVE 10/17/2021     Regarding pertinent Chronic problems:   History of Parkinson disease on carbidopa levodopa pramipexole ER Followed by neurology       While in ER: Clinical Course as of 10/17/21 1853  Sun Oct 17, 2021  1851 ED EKG 12-Lead Both EKG shows left bundle pattern with nonspecific ST changes.  No dynamic changes.  Husband does let me know that patient is following up with Dr. Loletha Grayer because of left bundle [AN]  1852 Resp Panel by RT-PCR (Flu A&B, Covid) Peripheral Reviewed, COVID-19 test is negative. [AN]    Clinical Course User Index [AN] Varney Biles, MD    UA found evidence of UTI       CXR - Cardiomegaly without acute abnormality of the lungs in AP portable projection.  CTabd/pelvis -  Ordered    Following Medications were ordered in ER: Medications  lactated ringers infusion ( Intravenous New Bag/Given 10/17/21 1528)  cefTRIAXone (ROCEPHIN) 2 g in sodium chloride 0.9 % 100 mL IVPB (0 g Intravenous Stopped 10/17/21 1608)  midazolam (VERSED) injection 1 mg (has no  administration in time range)  sodium chloride 0.9 % bolus 1,000 mL (1,000 mLs Intravenous New Bag/Given 10/17/21 1507)  midazolam (VERSED) injection 1 mg (1 mg Intravenous Given 10/17/21 1728)    ____    ED Triage Vitals  Enc Vitals Group     BP 10/17/21 1352 103/85     Pulse Rate 10/17/21 1352 (!) 117     Resp 10/17/21 1352 18     Temp 10/17/21 1352 99.4 F (37.4 C)     Temp Source 10/17/21 1352 Oral     SpO2 10/17/21 1351 95 %     Weight 10/17/21 1357 175 lb (79.4 kg)     Height 10/17/21 1357 5' 6"  (1.676 m)     Head Circumference --      Peak Flow --      Pain Score 10/17/21 1356 6     Pain Loc --      Pain Edu? --      Excl. in [place]? --   TMAX(24)@     _________________________________________ Significant initial  Findings: Abnormal Labs Reviewed  COMPREHENSIVE METABOLIC PANEL - Abnormal; Notable for the following components:      Result Value   Potassium 3.2 (*)    Glucose, Bld 121 (*)    Calcium 8.7 (*)    All other components within normal limits  CBC WITH  DIFFERENTIAL/PLATELET - Abnormal; Notable for the following components:   WBC 14.5 (*)    Neutro Abs 12.6 (*)    All other components within normal limits  APTT - Abnormal; Notable for the following components:   aPTT 39 (*)    All other components within normal limits  URINALYSIS, ROUTINE W REFLEX MICROSCOPIC - Abnormal; Notable for the following components:   APPearance CLOUDY (*)    Ketones, ur 20 (*)    Protein, ur 30 (*)    Nitrite POSITIVE (*)    Leukocytes,Ua LARGE (*)    WBC, UA >50 (*)    Bacteria, UA RARE (*)    Non Squamous Epithelial 0-5 (*)    All other components within normal limits    _________________________ Troponin  ordered ECG: Ordered Personally reviewed by me showing: HR : 109 Rhythm:  LAD  nonspecific changes  QTC 505   ____________________ This patient meets SIRS Criteria and may be septic.    The recent clinical data is shown below. Vitals:   10/17/21 1700 10/17/21 1730  10/17/21 1735 10/17/21 1800  BP: (!) 164/87 (!) 165/77  (!) 149/112  Pulse: 80 (!) 102  (!) 116  Resp: 18 (!) 24  (!) 26  Temp:      TempSrc:      SpO2: 96% (!) 63% 95% 98%  Weight:      Height:        WBC     Component Value Date/Time   WBC 14.5 (H) 10/17/2021 1504   LYMPHSABS 1.1 10/17/2021 1504   MONOABS 0.7 10/17/2021 1504   EOSABS 0.0 10/17/2021 1504   BASOSABS 0.0 10/17/2021 1504    Lactic Acid, Venous    Component Value Date/Time   LATICACIDVEN 1.0 10/17/2021 1504     Procalcitonin   Ordered     UA   evidence of UTI      Urine analysis:    Component Value Date/Time   COLORURINE YELLOW 10/17/2021 1504   APPEARANCEUR CLOUDY (A) 10/17/2021 1504   LABSPEC 1.015 10/17/2021 1504   PHURINE 6.0 10/17/2021 1504   GLUCOSEU NEGATIVE 10/17/2021 1504   HGBUR NEGATIVE 10/17/2021 1504   BILIRUBINUR NEGATIVE 10/17/2021 1504   KETONESUR 20 (A) 10/17/2021 1504   PROTEINUR 30 (A) 10/17/2021 1504   UROBILINOGEN 0.2 09/03/2008 0856   NITRITE POSITIVE (A) 10/17/2021 1504   LEUKOCYTESUR LARGE (A) 10/17/2021 1504    Results for orders placed or performed during the hospital encounter of 10/17/21  Resp Panel by RT-PCR (Flu A&B, Covid) Peripheral     Status: None   Collection Time: 10/17/21  3:04 PM   Specimen: Peripheral; Nasopharyngeal(NP) swabs in vial transport medium  Result Value Ref Range Status   SARS Coronavirus 2 by RT PCR NEGATIVE NEGATIVE Final         Influenza A by PCR NEGATIVE NEGATIVE Final   Influenza B by PCR NEGATIVE NEGATIVE Final           _______________________________________________ Hospitalist was called for admission for sepsis  The following Work up has been ordered so far:  Orders Placed This Encounter  Procedures   Resp Panel by RT-PCR (Flu A&B, Covid) Nasopharyngeal Swab   Blood Culture (routine x 2)   Urine Culture   DG Chest Port 1 View   Lactic acid, plasma   Comprehensive metabolic panel   CBC WITH DIFFERENTIAL   Protime-INR    APTT   Urinalysis, Routine w reflex microscopic   CK   Magnesium  Phosphorus   TSH   D-dimer, quantitative   Procalcitonin   Diet NPO time specified   Cardiac monitoring   Document height and weight   Assess and Document Glasgow Coma Scale   Document vital signs within 1-hour of fluid bolus completion. Notify provider of abnormal vital signs despite fluid resuscitation.   DO NOT delay antibiotics if unable to obtain blood culture.   Refer to Sidebar Report: Sepsis Sidebar ED/IP   Notify provider for difficulties obtaining IV access.   Insert peripheral IV x 2   Initiate Carrier Fluid Protocol   In and Out Cath   Cardiac monitoring   Refer to Sidebar Report: Sepsis Bundle ED/IP   If lactate (lactic acid) >2, verify repeat lactic acid order has been placed to be drawn   Document vital signs within 1-hour of fluid bolus completion and notify provider of bolus completion   Vital signs   Vital signs   Assess and Document Glasgow Coma Scale   Code Sepsis activation.  This occurs automatically when order is signed and prioritizes pharmacy, lab, and radiology services for STAT collections and interventions.  If [facility] downtime, call [facility] [phone]) to activate Code Sepsis.   Consult to hospitalist   Pharmacy Consult   Pulse oximetry, continuous   ED EKG 12-Lead   Repeat EKG   Place in observation (patient's expected length of stay will be less than 2 midnights)     OTHER Significant initial  Findings:  labs showing:    Recent Labs  Lab 10/17/21 1504  NA 135  K 3.2*  CO2 27  GLUCOSE 121*  BUN 13  CREATININE 0.73  CALCIUM 8.7*    Cr    stable,   Lab Results  Component Value Date   CREATININE 0.73 10/17/2021   CREATININE 0.88 02/05/2018   CREATININE 0.80 12/21/2015    Recent Labs  Lab 10/17/21 1504  AST 15  ALT 8  ALKPHOS 101  BILITOT 0.8  PROT 7.3  ALBUMIN 3.9   Lab Results  Component Value Date   CALCIUM 8.7 (L) 10/17/2021        Plt: Lab  Results  Component Value Date   PLT 230 10/17/2021      COVID-19 Labs  No results for input(s): DDIMER, FERRITIN, LDH, CRP in the last 72 hours.  Lab Results  Component Value Date   SARSCOV2NAA NEGATIVE 10/17/2021       Recent Labs  Lab 10/17/21 1504  WBC 14.5*  NEUTROABS 12.6*  HGB 12.5  HCT 39.4  MCV 90.2  PLT 230    HG/HCT   stable,      Component Value Date/Time   HGB 12.5 10/17/2021 1504   HCT 39.4 10/17/2021 1504   MCV 90.2 10/17/2021 1504         Radiological Exams on Admission: CT Angio Chest Pulmonary Embolism (PE) W or WO Contrast  Result Date: 10/17/2021 CLINICAL DATA:  PE suspected.  Evaluate for sepsis. EXAM: CT CHEST, ABDOMEN, AND PELVIS WITH CONTRAST TECHNIQUE: Multidetector CT imaging of the chest, abdomen and pelvis was performed following the standard protocol during bolus administration of intravenous contrast. RADIATION DOSE REDUCTION: This exam was performed according to the departmental dose-optimization program which includes automated exposure control, adjustment of the mA and/or kV according to patient size and/or use of iterative reconstruction technique. CONTRAST:  114m OMNIPAQUE IOHEXOL 350 MG/ML  SOLN COMPARISON:  CT chest 09/03/2008 FINDINGS: CT CHEST FINDINGS Cardiovascular: Satisfactory opacification to the level of the segmental pulmonary  arteries. No pulmonary embolism. Normal heart size. No pericardial effusion. Aortic calcifications. Mediastinum/Nodes: No enlarged mediastinal, hilar, or axillary lymph nodes. Thyroid gland, trachea, and esophagus demonstrate no significant findings. Lungs/Pleura: Lungs are clear. Minimal dependent subsegmental atelectasis. No pleural effusion or pneumothorax. Musculoskeletal: No chest wall mass or suspicious bone lesions identified. CT ABDOMEN PELVIS FINDINGS Hepatobiliary: No focal liver abnormality is seen. Several gallstones measuring up to 1.7 cm. No gallbladder wall thickening or pericholecystic fluid. The  extrahepatic bile duct is dilated measuring 1.0 cm in diameter (series 2, image 29; series 5, image 41). No discrete stone identified within the extrahepatic bile duct. No intrahepatic bile duct dilatation. Pancreas: Unremarkable. No pancreatic ductal dilatation or surrounding inflammatory changes. Spleen: Normal in size without focal abnormality. Adrenals/Urinary Tract: Adrenal glands are unremarkable. Mild left hydroureteronephrosis secondary to a 0.2 cm stone in the distal third of the left ureter (series 2, image 73). Normal right kidney. Normal appearance of the mildly distended urinary bladder. Stomach/Bowel: Stomach is within normal limits. The appendix is not directly visualized, however no pericecal inflammatory changes. Diverticulosis of the sigmoid colon. No evidence of bowel wall thickening, distention, or inflammatory changes. Vascular/Lymphatic: Aortic atherosclerosis. No enlarged abdominal or pelvic lymph nodes. Reproductive: Uterus and bilateral adnexa are unremarkable. Other: No abdominal wall hernia or abnormality. No abdominopelvic ascites. Musculoskeletal: Rightward curvature of the lumbar spine centered at the L1-L2 level. Multilevel degenerative disc disease in the lumbar spine, most severe at L1-L2 and L2-L3. No acute or suspicious osseous findings. IMPRESSION: CTA chest: 1. No pulmonary embolism. 2. Lungs are clear. 3.  Aortic Atherosclerosis (ICD10-I70.0). CT abdomen/pelvis: 1. Mild left hydroureteronephrosis secondary to a 0.2 cm stone in the distal third of the left ureter. 2. Cholelithiasis without findings of cholecystitis. 3. Sigmoid diverticulosis without findings of diverticulitis. Electronically Signed   By: Ileana Roup M.D.   On: 10/17/2021 21:34   CT ABDOMEN PELVIS W CONTRAST  Result Date: 10/17/2021 CLINICAL DATA:  PE suspected.  Evaluate for sepsis. EXAM: CT CHEST, ABDOMEN, AND PELVIS WITH CONTRAST TECHNIQUE: Multidetector CT imaging of the chest, abdomen and pelvis was  performed following the standard protocol during bolus administration of intravenous contrast. RADIATION DOSE REDUCTION: This exam was performed according to the departmental dose-optimization program which includes automated exposure control, adjustment of the mA and/or kV according to patient size and/or use of iterative reconstruction technique. CONTRAST:  12m OMNIPAQUE IOHEXOL 350 MG/ML  SOLN COMPARISON:  CT chest 09/03/2008 FINDINGS: CT CHEST FINDINGS Cardiovascular: Satisfactory opacification to the level of the segmental pulmonary arteries. No pulmonary embolism. Normal heart size. No pericardial effusion. Aortic calcifications. Mediastinum/Nodes: No enlarged mediastinal, hilar, or axillary lymph nodes. Thyroid gland, trachea, and esophagus demonstrate no significant findings. Lungs/Pleura: Lungs are clear. Minimal dependent subsegmental atelectasis. No pleural effusion or pneumothorax. Musculoskeletal: No chest wall mass or suspicious bone lesions identified. CT ABDOMEN PELVIS FINDINGS Hepatobiliary: No focal liver abnormality is seen. Several gallstones measuring up to 1.7 cm. No gallbladder wall thickening or pericholecystic fluid. The extrahepatic bile duct is dilated measuring 1.0 cm in diameter (series 2, image 29; series 5, image 41). No discrete stone identified within the extrahepatic bile duct. No intrahepatic bile duct dilatation. Pancreas: Unremarkable. No pancreatic ductal dilatation or surrounding inflammatory changes. Spleen: Normal in size without focal abnormality. Adrenals/Urinary Tract: Adrenal glands are unremarkable. Mild left hydroureteronephrosis secondary to a 0.2 cm stone in the distal third of the left ureter (series 2, image 73). Normal right kidney. Normal appearance of the mildly distended urinary bladder.  Stomach/Bowel: Stomach is within normal limits. The appendix is not directly visualized, however no pericecal inflammatory changes. Diverticulosis of the sigmoid colon. No  evidence of bowel wall thickening, distention, or inflammatory changes. Vascular/Lymphatic: Aortic atherosclerosis. No enlarged abdominal or pelvic lymph nodes. Reproductive: Uterus and bilateral adnexa are unremarkable. Other: No abdominal wall hernia or abnormality. No abdominopelvic ascites. Musculoskeletal: Rightward curvature of the lumbar spine centered at the L1-L2 level. Multilevel degenerative disc disease in the lumbar spine, most severe at L1-L2 and L2-L3. No acute or suspicious osseous findings. IMPRESSION: CTA chest: 1. No pulmonary embolism. 2. Lungs are clear. 3.  Aortic Atherosclerosis (ICD10-I70.0). CT abdomen/pelvis: 1. Mild left hydroureteronephrosis secondary to a 0.2 cm stone in the distal third of the left ureter. 2. Cholelithiasis without findings of cholecystitis. 3. Sigmoid diverticulosis without findings of diverticulitis. Electronically Signed   By: Ileana Roup M.D.   On: 10/17/2021 21:34   DG Chest Port 1 View  Result Date: 10/17/2021 CLINICAL DATA:  Questionable sepsis, foul-smelling urine, history of UTI EXAM: PORTABLE CHEST 1 VIEW COMPARISON:  12/21/2015 FINDINGS: Cardiomegaly. Both lungs are clear. The visualized skeletal structures are unremarkable. IMPRESSION: Cardiomegaly without acute abnormality of the lungs in AP portable projection. Electronically Signed   By: Delanna Ahmadi M.D.   On: 10/17/2021 15:13   _______________________________________________________________________________________________________ Latest  Blood pressure (!) 149/112, pulse (!) 116, temperature 99.4 F (37.4 C), temperature source Oral, resp. rate (!) 26, height 5' 6"  (1.676 m), weight 79.4 kg, SpO2 98 %.   Vitals  labs and radiology finding personally reviewed  Review of Systems:    Pertinent positives include:  fatigue, abdominal pain, nausea,  Fevers,  Constitutional:  No weight loss, night sweats, chills, weight loss  HEENT:  No headaches, Difficulty swallowing,Tooth/dental  problems,Sore throat,  No sneezing, itching, ear ache, nasal congestion, post nasal drip,  Cardio-vascular:  No chest pain, Orthopnea, PND, anasarca, dizziness, palpitations.no Bilateral lower extremity swelling  GI:  No heartburn, indigestion, vomiting, diarrhea, change in bowel habits, loss of appetite, melena, blood in stool, hematemesis Resp:  no shortness of breath at rest. No dyspnea on exertion, No excess mucus, no productive cough, No non-productive cough, No coughing up of blood.No change in color of mucus.No wheezing. Skin:  no rash or lesions. No jaundice GU:  no dysuria, change in color of urine, no urgency or frequency. No straining to urinate.  No flank pain.  Musculoskeletal:  No joint pain or no joint swelling. No decreased range of motion. No back pain.  Psych:  No change in mood or affect. No depression or anxiety. No memory loss.  Neuro: no localizing neurological complaints, no tingling, no weakness, no double vision, no gait abnormality, no slurred speech, no confusion  All systems reviewed and apart from [place] all are negative _______________________________________________________________________________________________ Past Medical History:   Past Medical History:  Diagnosis Date   Anxiety    Fibromyalgia    Hypercholesteremia    controlled on medication   Hyperlipidemia    Hypertension    no current meds   LBBB (left bundle branch block)    Melanoma (HCC)    MVP (mitral valve prolapse)    Osteopenia    Parkinson disease (HCC)    SVD (spontaneous vaginal delivery)    x 2      Past Surgical History:  Procedure Laterality Date   BLEPHAROPLASTY     cold knife conization     COLONOSCOPY     FH colon CA   DILATATION & CURETTAGE/HYSTEROSCOPY  WITH MYOSURE N/A 02/14/2018   Procedure: ATTEMPTED DILATATION & CURETTAGE/HYSTEROSCOPY WITH MYOSURE;  Surgeon: Christophe Louis, MD;  Location: [facility];  Service: Gynecology;  Laterality: N/A;  polyp   melanoma cancer      removed from left leg   TONSILLECTOMY     TUBAL LIGATION     WISDOM TOOTH EXTRACTION      Social History:  Ambulatory  cane or walker       reports that she quit smoking about 24 years ago. Her smoking use included cigarettes. She smoked an average of .25 packs per day. She has never used smokeless tobacco. She reports current alcohol use. She reports that she does not use drugs.     Family History:   Family History  Problem Relation Age of Onset   Cancer Mother        colon   Healthy Brother    Healthy Sister    Healthy Child    ______________________________________________________________________________________________ Allergies: Allergies  Allergen Reactions   Other Anaphylaxis    "Anti Seizure Medications"   Phenergan [Promethazine Hcl] Other (See Comments)    Makes Parkinson worse   Sporanox [Itraconazole] Hives   Tylenol [Acetaminophen] Other (See Comments)    "I just get weird"     Prior to Admission medications   Medication Sig Start Date End Date Taking? Authorizing Provider  ALPRAZolam Duanne Moron) 0.25 MG tablet Take 0.25 mg by mouth daily as needed for anxiety.    Yes [provider]  aspirin 325 MG tablet Take 325-650 mg by mouth daily as needed (for headaches/pain.).    Yes [provider]  atorvastatin (LIPITOR) 10 MG tablet Take 10 mg by mouth in the morning.   Yes [provider]  carbidopa-levodopa (SINEMET IR) 25-100 MG tablet TAKE 1 TABLET EVERY 2 HOURSUP TO 8 TABLETS PER DAY Patient taking differently: TAKE 1 TABLET EVERY 2 HOURS UP TO 8 TABLETS PER DAY 06/23/21  Yes [name], Eustace Quail, DO  Cholecalciferol (VITAMIN D-3) 5000 units TABS Take 5,000 Units by mouth 2 (two) times daily.   Yes [provider]  Coenzyme Q10-Vitamin E (QUNOL ULTRA COQ10 PO) Take 1 capsule by mouth daily.   Yes [provider]  CYMBALTA 60 MG capsule TAKE 1 CAPSULE DAILY 05/04/21  Yes [name], Wells Guiles S, DO  gabapentin (NEURONTIN) 400 MG  capsule TAKE 1 CAPSULE 3 TIMES A   DAY Patient taking differently: Take 400 mg by mouth 3 (three) times daily as needed (pain). 06/23/21  Yes [name], Eustace Quail, DO  methocarbamol (ROBAXIN) 750 MG tablet Take 750 mg by mouth every 6 (six) hours as needed for muscle spasms. 03/25/20  Yes [provider]  Multiple Vitamins-Minerals (PRESERVISION AREDS 2 PO) Take 1 tablet by mouth 2 (two) times daily.   Yes [provider]  raloxifene (EVISTA) 60 MG tablet Take 60 mg by mouth daily.   Yes [provider]  ramelteon (ROZEREM) 8 MG tablet TAKE 1 TABLET AT BEDTIME Patient taking differently: 8 mg at bedtime. 06/23/21  Yes [name], Eustace Quail, DO  AMBULATORY NON FORMULARY MEDICATION Lift chair Dx:  G20 09/24/21   TatEustace Quail, DO  Pramipexole Dihydrochloride 0.75 MG TB24 TAKE 1 TABLET DAILY Patient not taking: Reported on 10/17/2021 05/04/21   TatEustace Quail, DO    ___________________________________________________________________________________________________ Physical Exam: Vitals with BMI 10/17/2021 10/17/2021 10/17/2021  Height - - -  Weight - - -  BMI - - -  Systolic 633 354 562  Diastolic 563  77 87  Pulse 116 102 80     1. General:  in No  Acute distress   Chronically ill   2. Psychological: Alert and  Oriented to self 3. Head/ENT:    Dry Mucous Membranes                          Head Non traumatic, neck supple                         Poor Dentition 4. SKIN:  decreased Skin turgor,  Skin clean Dry and intact no rash 5. Heart: Regular rate and rhythm no  Murmur, no Rub or gallop 6. Lungs: , no wheezes or crackles   7. Abdomen: Soft,  non-tender, Non distended   obese  bowel sounds present 8. Lower extremities: no clubbing, cyanosis, no  edema 9. Neurologically Grossly intact, moving all 4 extremities equally   10. MSK: Normal range of motion    Chart has been  reviewed  ______________________________________________________________________________________________  Assessment/Plan 71 y.o. female with medical history significant of  Parkinson disease, fibrolyalgia, LBBB, HLD   Admitted for severe sepsis  Present on Admission:  Severe sepsis ([place])  UTI (urinary tract infection)  Acute metabolic encephalopathy  Dehydration  Parkinson disease (HCC)  Hypokalemia  LBBB (left bundle branch block)  Hyperlipidemia  Cardiomegaly  Abdominal pain  Tachycardia  Hydronephrosis with infection     Severe sepsis (HCC)  -SIRS criteria met with elevated white blood cell count,       Component Value Date/Time   WBC 14.5 (H) 10/17/2021 1504   LYMPHSABS 1.1 10/17/2021 1504     tachycardia   ,  RR >20 Today's Vitals   10/17/21 1700 10/17/21 1730 10/17/21 1735 10/17/21 1800  BP: (!) 164/87 (!) 165/77  (!) 149/112  Pulse: 80 (!) 102  (!) 116  Resp: 18 (!) 24  (!) 26  Temp:      TempSrc:      SpO2: 96% (!) 63% 95% 98%  Weight:      Height:      PainSc:         The recent clinical data is shown below. Vitals:   10/17/21 1700 10/17/21 1730 10/17/21 1735 10/17/21 1800  BP: (!) 164/87 (!) 165/77  (!) 149/112  Pulse: 80 (!) 102  (!) 116  Resp: 18 (!) 24  (!) 26  Temp:      TempSrc:      SpO2: 96% (!) 63% 95% 98%  Weight:      Height:        -Most likely source being:  Urinary,    Patient meeting criteria for Severe sepsis with    evidence of end organ damage/organ dysfunction such as     acute metabolic encephalopathy    - Obtain serial lactic acid and procalcitonin level.  - Initiated IV antibiotics in ER: Antibiotics Given (last 72 hours)     Date/Time Action Medication Dose Rate   10/17/21 1524 New Bag/Given   cefTRIAXone (ROCEPHIN) 2 g in sodium chloride 0.9 % 100 mL IVPB 2 g 200 mL/hr       Will continue  on :   - await results of blood and urine culture  - Rehydrate aggressively      6:54 PM   Acute metabolic  encephalopathy   - most likely multifactorial secondary to combination of  infection   mild dehydration secondary to  decreased by mouth intake,    - Will rehydrate   - treat underlining infection   - Hold contributing medications   - if no improvement may need further imaging to evaluate for CNS pathology pathology such as MRI of the brain   - neurological exam appears to be nonfocal but patient unable to cooperate fully      - no history of liver disease    Dehydration Rehydrate and follow fluid status  Parkinson disease ([place]) Restart home medications when able to tolerate  Hypokalemia - will replace and repeat in AM,  check magnesium level and replace as needed   LBBB (left bundle branch block) Chronic stable  Hyperlipidemia Restart Lipitor unable to tolerate  Cardiomegaly Obtain echogram monitor on telemetry  Abdominal pain Ordered CT to further evaluate  Tachycardia In the setting of fever, and sepsis given persistent tachypnea, Family reports occasional dyspnea Elevated d.demer and recent long trip will order CTA  Hydronephrosis with infection Mild left hydroureter with  0.2 mm stone Discussed case with urology Rec: NPO, Flomax, strain all urine, place foley Continue to observe for now and Reassess in AM If decompensates please call back UROLOGY stat        Other plan as per orders.  DVT prophylaxis:  SCD      Code Status:    Code Status: Not on file FULL CODE   as per patient   I had personally discussed CODE STATUS with  family     Family Communication:   Family   at  Bedside  plan of care was discussed   Husband   Disposition Plan:     To home once workup is complete and patient is stable   Following barriers for discharge:                            Electrolytes corrected                               Anemia corrected                             Pain controlled with PO medications                               Afebrile, white count improving  able to transition to PO antibiotics                             Will need to be able to tolerate PO                                                   Would benefit from PT/OT eval prior to DC  Ordered                     Consults called: none  Admission status:  ED Disposition     ED Disposition  Admit   Condition  --   [place]: [place] [[id number]]  Level of Care: Progressive [102]  Admit to Progressive based on following criteria: MULTISYSTEM THREATS  such as stable sepsis, metabolic/electrolyte imbalance with or without encephalopathy that is responding to early treatment.  May place patient in observation at [facility] or [place] if equivalent level of care is available:: No  Covid Evaluation: Confirmed COVID Negative  Diagnosis: Sepsis [facility]) [[id number]]  Admitting Physician: Toy Baker [[id number]]  Attending Physician: Toy Baker [[id number]]          Obs      Level of care       progressive tele indefinitely please discontinue once patient no longer qualifies COVID-19 Labs    Lab Results  Component Value Date   [place] 10/17/2021     Precautions: admitted as   Covid Negative      Critical   Patient is critically ill due to  hemodynamic instability  severe sepsis  They are at high risk for life/limb threatening clinical deterioration requiring frequent reassessment and modifications of care.  Services provided include examination of the patient, review of relevant ancillary tests, prescription of lifesaving therapies, review of medications and prophylactic therapy.  Total critical care time excluding separately billable procedures: 60   Minutes.   Arista Kettlewell 10/18/2021, 12:44 AM    [facility]     after 2 AM please page floor coverage PA If 7AM-7PM, please contact the day team taking care of the patient using [email or web address]   Patient was evaluated in the context of the global COVID-19  pandemic, which necessitated consideration that the patient might be at risk for infection with the SARS-CoV-2 virus that causes COVID-19. Institutional protocols and algorithms that pertain to the evaluation of patients at risk for COVID-19 are in a state of rapid change based on information released by regulatory bodies including the CDC and federal and state organizations. These policies and algorithms were followed during the patient's care.

## 2021-10-17 NOTE — Assessment & Plan Note (Signed)
Restart Lipitor unable to tolerate

## 2021-10-17 NOTE — Assessment & Plan Note (Signed)
-  SIRS criteria met with elevated white blood cell count,       Component Value Date/Time   WBC 14.5 (H) 10/17/2021 1504   LYMPHSABS 1.1 10/17/2021 1504     tachycardia   ,  RR >20 Today's Vitals   10/17/21 1700 10/17/21 1730 10/17/21 1735 10/17/21 1800  BP: (!) 164/87 (!) 165/77  (!) 149/112  Pulse: 80 (!) 102  (!) 116  Resp: 18 (!) 24  (!) 26  Temp:      TempSrc:      SpO2: 96% (!) 63% 95% 98%  Weight:      Height:      PainSc:         The recent clinical data is shown below. Vitals:   10/17/21 1700 10/17/21 1730 10/17/21 1735 10/17/21 1800  BP: (!) 164/87 (!) 165/77  (!) 149/112  Pulse: 80 (!) 102  (!) 116  Resp: 18 (!) 24  (!) 26  Temp:      TempSrc:      SpO2: 96% (!) 63% 95% 98%  Weight:      Height:        -Most likely source being:  Urinary,    Patient meeting criteria for Severe sepsis with    evidence of end organ damage/organ dysfunction such as     acute metabolic encephalopathy    - Obtain serial lactic acid and procalcitonin level.  - Initiated IV antibiotics in ER: Antibiotics Given (last 72 hours)    Date/Time Action Medication Dose Rate   10/17/21 1524 New Bag/Given   cefTRIAXone (ROCEPHIN) 2 g in sodium chloride 0.9 % 100 mL IVPB 2 g 200 mL/hr      Will continue  on :   - await results of blood and urine culture  - Rehydrate aggressively      6:54 PM

## 2021-10-17 NOTE — Assessment & Plan Note (Signed)
Restart home medications when able to tolerate

## 2021-10-17 NOTE — Assessment & Plan Note (Signed)
In the setting of fever, and sepsis given persistent tachypnea, Family reports occasional dyspnea Elevated d.demer and recent long trip will order CTA

## 2021-10-17 NOTE — ED Provider Notes (Signed)
[facility] DEPT Provider Note   CSN: [id number] Arrival date & time: 10/17/21  1342     History  Chief Complaint  Patient presents with   Dysuria   Abdominal Pain   Altered Mental Status    Morgan [name] is a 71 y.o. female.  HPI     71 year old female brought into the ER by her husband with chief complaint of altered mental status, abdominal pain and discomfort with urination.  Level 5 caveat for altered mental status.  Patient's husband indicates that patient started having change in behavior last night.  She has become more confused and somnolent.  She also has had increased weakness.  She was complaining of some abdominal discomfort and discomfort with urination to him.  Patient has past medical history of Parkinson disease, MVP, hypertension, hyperlipidemia.   Home Medications Prior to Admission medications   Medication Sig Start Date End Date Taking? Authorizing Provider  ALPRAZolam Duanne Moron) 0.25 MG tablet Take 0.25 mg by mouth daily as needed for anxiety.    Yes [provider]  aspirin 325 MG tablet Take 325-650 mg by mouth daily as needed (for headaches/pain.).    Yes [provider]  atorvastatin (LIPITOR) 10 MG tablet Take 10 mg by mouth in the morning.   Yes [provider]  carbidopa-levodopa (SINEMET IR) 25-100 MG tablet TAKE 1 TABLET EVERY 2 HOURSUP TO 8 TABLETS PER DAY Patient taking differently: TAKE 1 TABLET EVERY 2 HOURS UP TO 8 TABLETS PER DAY 06/23/21  Yes [name], Eustace Quail, DO  Cholecalciferol (VITAMIN D-3) 5000 units TABS Take 5,000 Units by mouth 2 (two) times daily.   Yes [provider]  Coenzyme Q10-Vitamin E (QUNOL ULTRA COQ10 PO) Take 1 capsule by mouth daily.   Yes [provider]  CYMBALTA 60 MG capsule TAKE 1 CAPSULE DAILY 05/04/21  Yes [name], Wells Guiles S, DO  gabapentin (NEURONTIN) 400 MG capsule TAKE 1 CAPSULE 3 TIMES A   DAY Patient taking differently: Take 400 mg by  mouth 3 (three) times daily as needed (pain). 06/23/21  Yes [name], Eustace Quail, DO  methocarbamol (ROBAXIN) 750 MG tablet Take 750 mg by mouth every 6 (six) hours as needed for muscle spasms. 03/25/20  Yes [provider]  Multiple Vitamins-Minerals (PRESERVISION AREDS 2 PO) Take 1 tablet by mouth 2 (two) times daily.   Yes [provider]  raloxifene (EVISTA) 60 MG tablet Take 60 mg by mouth daily.   Yes [provider]  ramelteon (ROZEREM) 8 MG tablet TAKE 1 TABLET AT BEDTIME Patient taking differently: 8 mg at bedtime. 06/23/21  Yes [name], Eustace Quail, DO  AMBULATORY NON FORMULARY MEDICATION Lift chair Dx:  G20 09/24/21   TatEustace Quail, DO  Pramipexole Dihydrochloride 0.75 MG TB24 TAKE 1 TABLET DAILY Patient not taking: Reported on 10/17/2021 05/04/21   [name], Eustace Quail, DO      Allergies    Other, Phenergan [promethazine hcl], Sporanox [itraconazole], and Tylenol [acetaminophen]    Review of Systems   Review of Systems  Unable to perform ROS: Mental status change   Physical Exam Updated Vital Signs BP (!) 149/112    Pulse (!) 116    Temp 99.4 F (37.4 C) (Oral)    Resp (!) 26    Ht 5\' 6"  (1.676 m)    Wt 79.4 kg    SpO2 98%    BMI 28.25 kg/m  Physical Exam Vitals and nursing note reviewed.  Constitutional:  Appearance: She is well-developed.     Comments: Somnolent, slightly confused  HENT:     Head: Atraumatic.  Cardiovascular:     Rate and Rhythm: Tachycardia present.  Pulmonary:     Effort: Pulmonary effort is normal.  Abdominal:     Palpations: Abdomen is soft.     Tenderness: There is abdominal tenderness. There is no guarding or rebound.  Musculoskeletal:     Cervical back: Normal range of motion and neck supple.  Skin:    General: Skin is warm and dry.  Neurological:     Comments: Oriented to self and location.  Unable to tell me her date of birth or year    ED Results / Procedures / Treatments   Labs (all labs ordered are listed, but only  abnormal results are displayed) Labs Reviewed  COMPREHENSIVE METABOLIC PANEL - Abnormal; Notable for the following components:      Result Value   Potassium 3.2 (*)    Glucose, Bld 121 (*)    Calcium 8.7 (*)    All other components within normal limits  CBC WITH DIFFERENTIAL/PLATELET - Abnormal; Notable for the following components:   WBC 14.5 (*)    Neutro Abs 12.6 (*)    All other components within normal limits  APTT - Abnormal; Notable for the following components:   aPTT 39 (*)    All other components within normal limits  URINALYSIS, ROUTINE W REFLEX MICROSCOPIC - Abnormal; Notable for the following components:   APPearance CLOUDY (*)    Ketones, ur 20 (*)    Protein, ur 30 (*)    Nitrite POSITIVE (*)    Leukocytes,Ua LARGE (*)    WBC, UA >50 (*)    Bacteria, UA RARE (*)    Non Squamous Epithelial 0-5 (*)    All other components within normal limits  RESP PANEL BY RT-PCR (FLU A&B, COVID) ARPGX2  CULTURE, BLOOD (ROUTINE X 2)  CULTURE, BLOOD (ROUTINE X 2)  URINE CULTURE  LACTIC ACID, PLASMA  PROTIME-INR  LACTIC ACID, PLASMA  CK  MAGNESIUM  PHOSPHORUS  TSH  D-DIMER, QUANTITATIVE  PROCALCITONIN  TROPONIN I (HIGH SENSITIVITY)    EKG EKG Interpretation  Date/Time:  Sunday October 17 2021 16:26:09 EST Ventricular Rate:  109 PR Interval:  110 QRS Duration: 136 QT Interval:  375 QTC Calculation: 505 R Axis:   -88 Text Interpretation: Sinus tachycardia Nonspecific IVCD with LAD Left ventricular hypertrophy Probable anterolateral infarct, acute Nonspecific ST abnormality Confirmed by Varney Biles ([id number]) on 10/17/2021 5:53:56 PM  Radiology DG Chest Port 1 View  Result Date: 10/17/2021 CLINICAL DATA:  Questionable sepsis, foul-smelling urine, history of UTI EXAM: PORTABLE CHEST 1 VIEW COMPARISON:  12/21/2015 FINDINGS: Cardiomegaly. Both lungs are clear. The visualized skeletal structures are unremarkable. IMPRESSION: Cardiomegaly without acute abnormality of the  lungs in AP portable projection. Electronically Signed   By: Delanna Ahmadi M.D.   On: 10/17/2021 15:13    Procedures .Critical Care Performed by: Varney Biles, MD Authorized by: Varney Biles, MD   Critical care provider statement:    Critical care time (minutes):  55   Critical care was time spent personally by me on the following activities:  Development of treatment plan with patient or surrogate, discussions with consultants, evaluation of patient's response to treatment, examination of patient, ordering and review of laboratory studies, ordering and review of radiographic studies, ordering and performing treatments and interventions, pulse oximetry, re-evaluation of patient's condition and review of old charts  Medications Ordered in ED Medications  lactated ringers infusion ( Intravenous New Bag/Given 10/17/21 1528)  cefTRIAXone (ROCEPHIN) 2 g in sodium chloride 0.9 % 100 mL IVPB (0 g Intravenous Stopped 10/17/21 1608)  midazolam (VERSED) injection 1 mg (has no administration in time range)  sodium chloride 0.9 % bolus 1,000 mL (1,000 mLs Intravenous New Bag/Given 10/17/21 1507)  midazolam (VERSED) injection 1 mg (1 mg Intravenous Given 10/17/21 1728)    ED Course/ Medical Decision Making/ A&P Clinical Course as of 10/17/21 1852  Sun Oct 17, 2021  1851 ED EKG 12-Lead Both EKG shows left bundle pattern with nonspecific ST changes.  No dynamic changes.  Husband does let me know that patient is following up with Dr. Loletha Grayer because of left bundle [AN]  1852 Resp Panel by RT-PCR (Flu A&B, Covid) Peripheral Reviewed, COVID-19 test is negative. [AN]    Clinical Course User Index [AN] Varney Biles, MD                           Medical Decision Making Amount and/or Complexity of Data Reviewed Labs: ordered. Radiology: ordered. ECG/medicine tests: ordered.  Risk Prescription drug management. Decision regarding hospitalization.   This patient presents to the ED with chief  complaint(s) of altered mental status, abdominal discomfort and discomfort with urination with pertinent past medical history of fibromyalgia which further complicates the presenting complaint. The complaint involves an extensive differential diagnosis and treatment options and also carries with it a high risk of complications and morbidity.    The differential diagnosis includes: UTI, pyelonephritis, medication withdrawals, meningitis, toxic encephalitis, diverticulitis, appendicitis, SBO, sepsis.  The initial thought is that most likely patient is having AMS secondary to infection, most likely UTI.  Patient likely is having early sepsis. Plan is to order basic labs and get urine analysis, and reassess after the UA to see if further testing is needed.  Additional Tests and treatment considered: Patient is low risk / negative from stroke perspective.  There is no focal deficits, therefore do not feel that CT head is indicated.  Additional history obtained: Additional history obtained from spouse Records reviewed Primary Care Documents  Reassessment and review (also see workup area): Lab Tests: I Ordered, and personally interpreted labs.  The pertinent results include: Urine analysis that has nitrite positive urine with pyuria and bacteriuria, leukocytosis.  Repeat abdominal exam is unchanged, no peritoneal findings, lower quadrant discomfort without guarding.  I visualized patient's chest x-ray independently and there is no evidence of pneumothorax or focal consolidation.    Reevaluation of the patient after these medicines showed that the patient    stayed the same   Cardiac Monitoring: The patient was maintained on a cardiac monitor.  I personally viewed and interpreted the cardiac monitor which showed an underlying rhythm of:  sinus tachycardia  Complexity of problems addressed: Patients presentation is most consistent with  acute illness/injury with systemic symptoms During  patient's assessment  Disposition: After consideration of the diagnostic results and the patients response to treatment,  I feel that the patent would benefit from admission to medicine for what appears to be severe sepsis secondary to UTI/pyelonephritis .    Final Clinical Impression(s) / ED Diagnoses Final diagnoses:  Cystitis  Severe sepsis [facility])    Rx / DC Orders ED Discharge Orders     None         Varney Biles, MD 10/17/21 [phone]

## 2021-10-17 NOTE — Assessment & Plan Note (Signed)
Rehydrate and follow fluid status 

## 2021-10-18 ENCOUNTER — Encounter (HOSPITAL_COMMUNITY)
Admission: EM | Disposition: A | Discharge status: Home Health | Payer: Self-pay | Source: Home / Self Care | Attending: Family Medicine

## 2021-10-18 ENCOUNTER — Observation Stay (HOSPITAL_COMMUNITY): Payer: Medicare Other | Admitting: Certified Registered Nurse Anesthetist

## 2021-10-18 ENCOUNTER — Observation Stay (HOSPITAL_COMMUNITY): Payer: Medicare Other

## 2021-10-18 ENCOUNTER — Encounter (HOSPITAL_COMMUNITY): Payer: Self-pay | Admitting: Internal Medicine

## 2021-10-18 ENCOUNTER — Observation Stay (HOSPITAL_BASED_OUTPATIENT_CLINIC_OR_DEPARTMENT_OTHER): Payer: Medicare Other | Admitting: Certified Registered Nurse Anesthetist

## 2021-10-18 DIAGNOSIS — G20 Parkinson's disease: Secondary | ICD-10-CM | POA: Diagnosis present

## 2021-10-18 DIAGNOSIS — Z79899 Other long term (current) drug therapy: Secondary | ICD-10-CM | POA: Diagnosis not present

## 2021-10-18 DIAGNOSIS — R7881 Bacteremia: Secondary | ICD-10-CM

## 2021-10-18 DIAGNOSIS — I517 Cardiomegaly: Secondary | ICD-10-CM | POA: Diagnosis not present

## 2021-10-18 DIAGNOSIS — N390 Urinary tract infection, site not specified: Secondary | ICD-10-CM | POA: Diagnosis not present

## 2021-10-18 DIAGNOSIS — I10 Essential (primary) hypertension: Secondary | ICD-10-CM | POA: Diagnosis not present

## 2021-10-18 DIAGNOSIS — A4151 Sepsis due to Escherichia coli [E. coli]: Secondary | ICD-10-CM | POA: Diagnosis present

## 2021-10-18 DIAGNOSIS — Z85820 Personal history of malignant melanoma of skin: Secondary | ICD-10-CM | POA: Diagnosis not present

## 2021-10-18 DIAGNOSIS — Z800 Family history of malignant neoplasm of digestive organs: Secondary | ICD-10-CM | POA: Diagnosis not present

## 2021-10-18 DIAGNOSIS — N131 Hydronephrosis with ureteral stricture, not elsewhere classified: Secondary | ICD-10-CM | POA: Diagnosis not present

## 2021-10-18 DIAGNOSIS — N10 Acute tubulo-interstitial nephritis: Secondary | ICD-10-CM | POA: Diagnosis present

## 2021-10-18 DIAGNOSIS — G9341 Metabolic encephalopathy: Secondary | ICD-10-CM | POA: Diagnosis present

## 2021-10-18 DIAGNOSIS — N136 Pyonephrosis: Secondary | ICD-10-CM

## 2021-10-18 DIAGNOSIS — N289 Disorder of kidney and ureter, unspecified: Secondary | ICD-10-CM | POA: Diagnosis not present

## 2021-10-18 DIAGNOSIS — N201 Calculus of ureter: Secondary | ICD-10-CM | POA: Diagnosis not present

## 2021-10-18 DIAGNOSIS — B9620 Unspecified Escherichia coli [E. coli] as the cause of diseases classified elsewhere: Secondary | ICD-10-CM

## 2021-10-18 DIAGNOSIS — Z87891 Personal history of nicotine dependence: Secondary | ICD-10-CM | POA: Diagnosis not present

## 2021-10-18 DIAGNOSIS — Z7982 Long term (current) use of aspirin: Secondary | ICD-10-CM | POA: Diagnosis not present

## 2021-10-18 DIAGNOSIS — Z888 Allergy status to other drugs, medicaments and biological substances status: Secondary | ICD-10-CM | POA: Diagnosis not present

## 2021-10-18 DIAGNOSIS — A419 Sepsis, unspecified organism: Secondary | ICD-10-CM | POA: Diagnosis not present

## 2021-10-18 DIAGNOSIS — Z20822 Contact with and (suspected) exposure to covid-19: Secondary | ICD-10-CM | POA: Diagnosis present

## 2021-10-18 DIAGNOSIS — E860 Dehydration: Secondary | ICD-10-CM | POA: Diagnosis present

## 2021-10-18 DIAGNOSIS — E876 Hypokalemia: Secondary | ICD-10-CM | POA: Diagnosis present

## 2021-10-18 DIAGNOSIS — R5381 Other malaise: Secondary | ICD-10-CM | POA: Diagnosis not present

## 2021-10-18 DIAGNOSIS — N3000 Acute cystitis without hematuria: Secondary | ICD-10-CM | POA: Diagnosis not present

## 2021-10-18 DIAGNOSIS — I447 Left bundle-branch block, unspecified: Secondary | ICD-10-CM | POA: Diagnosis present

## 2021-10-18 DIAGNOSIS — N3090 Cystitis, unspecified without hematuria: Secondary | ICD-10-CM | POA: Diagnosis present

## 2021-10-18 DIAGNOSIS — R6520 Severe sepsis without septic shock: Secondary | ICD-10-CM | POA: Diagnosis present

## 2021-10-18 HISTORY — PX: CYSTOSCOPY W/ URETERAL STENT PLACEMENT: SHX1429

## 2021-10-18 LAB — BLOOD CULTURE ID PANEL (REFLEXED) - BCID2

## 2021-10-18 LAB — CBC WITH DIFFERENTIAL/PLATELET
Abs Immature Granulocytes: 0.05 10*3/uL (ref 0.00–0.07)
Basophils Absolute: 0 10*3/uL (ref 0.0–0.1)
Basophils Relative: 0 %
Eosinophils Absolute: 0 10*3/uL (ref 0.0–0.5)
Eosinophils Relative: 0 %
HCT: 34.4 % — ABNORMAL LOW (ref 36.0–46.0)
Hemoglobin: 10.8 g/dL — ABNORMAL LOW (ref 12.0–15.0)
Immature Granulocytes: 0 %
Lymphocytes Relative: 10 %
Lymphs Abs: 1.2 10*3/uL (ref 0.7–4.0)
MCH: 28.5 pg (ref 26.0–34.0)
MCHC: 31.4 g/dL (ref 30.0–36.0)
MCV: 90.8 fL (ref 80.0–100.0)
Monocytes Absolute: 0.8 10*3/uL (ref 0.1–1.0)
Monocytes Relative: 7 %
Neutro Abs: 10.1 10*3/uL — ABNORMAL HIGH (ref 1.7–7.7)
Neutrophils Relative %: 83 %
Platelets: 181 10*3/uL (ref 150–400)
RBC: 3.79 MIL/uL — ABNORMAL LOW (ref 3.87–5.11)
RDW: 14.2 % (ref 11.5–15.5)
WBC: 12.2 10*3/uL — ABNORMAL HIGH (ref 4.0–10.5)
nRBC: 0 % (ref 0.0–0.2)

## 2021-10-18 LAB — PHOSPHORUS: Phosphorus: 3 mg/dL (ref 2.5–4.6)

## 2021-10-18 LAB — COMPREHENSIVE METABOLIC PANEL
ALT: 7 U/L (ref 0–44)
AST: 13 U/L — ABNORMAL LOW (ref 15–41)
Albumin: 3.1 g/dL — ABNORMAL LOW (ref 3.5–5.0)
Alkaline Phosphatase: 85 U/L (ref 38–126)
Anion gap: 7 (ref 5–15)
BUN: 11 mg/dL (ref 8–23)
CO2: 24 mmol/L (ref 22–32)
Calcium: 8.1 mg/dL — ABNORMAL LOW (ref 8.9–10.3)
Chloride: 104 mmol/L (ref 98–111)
Creatinine, Ser: 0.67 mg/dL (ref 0.44–1.00)
GFR, Estimated: >60 mL/min (ref >60)
Glucose, Bld: 107 mg/dL — ABNORMAL HIGH (ref 70–99)
Potassium: 3.3 mmol/L — ABNORMAL LOW (ref 3.5–5.1)
Sodium: 135 mmol/L (ref 135–145)
Total Bilirubin: 0.8 mg/dL (ref 0.3–1.2)
Total Protein: 6.4 g/dL — ABNORMAL LOW (ref 6.5–8.1)

## 2021-10-18 LAB — ECHOCARDIOGRAM COMPLETE
AR max vel: 2.62 cm2
AV Peak grad: 8.1 mmHg
Ao pk vel: 1.43 m/s
Area-P 1/2: 5.34 cm2
Height: 66 in
MV M vel: 5.06 m/s
MV Peak grad: 102.4 mmHg
S' Lateral: 2.8 cm
Weight: 2800.72 oz

## 2021-10-18 LAB — MAGNESIUM: Magnesium: 1.8 mg/dL (ref 1.7–2.4)

## 2021-10-18 LAB — LACTIC ACID, PLASMA: Lactic Acid, Venous: 1 mmol/L (ref 0.5–1.9)

## 2021-10-18 LAB — TSH: TSH: 1.4 u[IU]/mL (ref 0.350–4.500)

## 2021-10-18 SURGERY — CYSTOSCOPY, WITH RETROGRADE PYELOGRAM AND URETERAL STENT INSERTION
Anesthesia: General | Laterality: Left

## 2021-10-18 MED ORDER — FENTANYL CITRATE (PF) 100 MCG/2ML IJ SOLN
INTRAMUSCULAR | Status: AC
  Filled 2021-10-18: qty 2

## 2021-10-18 MED ORDER — PROPOFOL 10 MG/ML IV BOLUS
INTRAVENOUS | Status: AC
Start: 2021-10-18 — End: ?
  Filled 2021-10-18: qty 20

## 2021-10-18 MED ORDER — TAMSULOSIN HCL 0.4 MG PO CAPS
0.4000 mg | ORAL_CAPSULE | Freq: Every day | ORAL | Status: DC
Start: 2021-10-18 — End: 2021-10-21
  Administered 2021-10-18 – 2021-10-20 (×4): 0.4 mg via ORAL
  Filled 2021-10-18 (×4): qty 1

## 2021-10-18 MED ORDER — SODIUM CHLORIDE 0.9 % IR SOLN
Status: DC | PRN
  Administered 2021-10-18: 3000 mL

## 2021-10-18 MED ORDER — LACTATED RINGERS IV SOLN
INTRAVENOUS | Status: DC
Start: 2021-10-18 — End: 2021-10-18

## 2021-10-18 MED ORDER — IOHEXOL 300 MG/ML  SOLN
INTRAMUSCULAR | Status: DC | PRN
  Administered 2021-10-18: 10 mL

## 2021-10-18 MED ORDER — ONDANSETRON HCL 4 MG/2ML IJ SOLN
INTRAMUSCULAR | Status: DC | PRN
  Administered 2021-10-18: 4 mg via INTRAVENOUS

## 2021-10-18 MED ORDER — LIDOCAINE 2% (20 MG/ML) 5 ML SYRINGE
INTRAMUSCULAR | Status: DC | PRN
Start: 2021-10-18 — End: 2021-10-18
  Administered 2021-10-18: 60 mg via INTRAVENOUS

## 2021-10-18 MED ORDER — FENTANYL CITRATE PF 50 MCG/ML IJ SOSY: 25.0000 ug | PREFILLED_SYRINGE | INTRAMUSCULAR | Status: DC | PRN

## 2021-10-18 MED ORDER — SUCCINYLCHOLINE CHLORIDE 200 MG/10ML IV SOSY
PREFILLED_SYRINGE | INTRAVENOUS | Status: DC | PRN
  Administered 2021-10-18: 120 mg via INTRAVENOUS

## 2021-10-18 MED ORDER — POTASSIUM CHLORIDE CRYS ER 20 MEQ PO TBCR
40.0000 meq | EXTENDED_RELEASE_TABLET | ORAL | Status: AC
  Administered 2021-10-18 (×2): 40 meq via ORAL
  Filled 2021-10-18 (×3): qty 2

## 2021-10-18 MED ORDER — PHENYLEPHRINE 40 MCG/ML (10ML) SYRINGE FOR IV PUSH (FOR BLOOD PRESSURE SUPPORT)
PREFILLED_SYRINGE | INTRAVENOUS | Status: DC | PRN
  Administered 2021-10-18 (×2): 80 ug via INTRAVENOUS

## 2021-10-18 MED ORDER — CHLORHEXIDINE GLUCONATE 0.12 % MT SOLN
15.0000 mL | OROMUCOSAL | Status: AC
  Administered 2021-10-18: 15 mL via OROMUCOSAL

## 2021-10-18 MED ORDER — PROPOFOL 10 MG/ML IV BOLUS
INTRAVENOUS | Status: DC | PRN
  Administered 2021-10-18: 40 mg via INTRAVENOUS
  Administered 2021-10-18: 160 mg via INTRAVENOUS

## 2021-10-18 MED ORDER — CHLORHEXIDINE GLUCONATE CLOTH 2 % EX PADS
6.0000 | MEDICATED_PAD | Freq: Every day | CUTANEOUS | Status: DC
  Administered 2021-10-18: 6 via TOPICAL

## 2021-10-18 MED ORDER — FENTANYL CITRATE (PF) 100 MCG/2ML IJ SOLN
INTRAMUSCULAR | Status: DC | PRN
  Administered 2021-10-18 (×2): 50 ug via INTRAVENOUS

## 2021-10-18 MED ORDER — CHLORHEXIDINE GLUCONATE CLOTH 2 % EX PADS: 6.0000 | MEDICATED_PAD | Freq: Every day | CUTANEOUS | Status: DC

## 2021-10-18 MED ORDER — DEXAMETHASONE SODIUM PHOSPHATE 4 MG/ML IJ SOLN
INTRAMUSCULAR | Status: DC | PRN
  Administered 2021-10-18: 5 mg via INTRAVENOUS

## 2021-10-18 MED ORDER — LIDOCAINE HCL (PF) 2 % IJ SOLN
INTRAMUSCULAR | Status: AC
  Filled 2021-10-18: qty 5

## 2021-10-18 SURGICAL SUPPLY — 18 items
BAG URINE DRAIN 2000ML AR STRL (UROLOGICAL SUPPLIES) ×1 IMPLANT
BAG URO CATCHER STRL LF (MISCELLANEOUS) ×2 IMPLANT
BASKET ZERO TIP NITINOL 2.4FR (BASKET) IMPLANT
CATH FOLEY 2WAY SLVR  5CC 16FR (CATHETERS) ×2
CATH FOLEY 2WAY SLVR 5CC 16FR (CATHETERS) IMPLANT
CATH URETL 5X70 OPEN END (CATHETERS) ×1 IMPLANT
CATH URETL OPEN END 6FR 70 (CATHETERS) IMPLANT
CLOTH BEACON ORANGE TIMEOUT ST (SAFETY) ×2 IMPLANT
GLOVE SURG ENC TEXT LTX SZ7.5 (GLOVE) ×2 IMPLANT
GOWN STRL REUS W/TWL XL LVL3 (GOWN DISPOSABLE) ×2 IMPLANT
GUIDEWIRE ANG ZIPWIRE 038X150 (WIRE) IMPLANT
GUIDEWIRE STR DUAL SENSOR (WIRE) ×2 IMPLANT
KIT TURNOVER KIT A (KITS) IMPLANT
MANIFOLD NEPTUNE II (INSTRUMENTS) ×2 IMPLANT
PACK CYSTO (CUSTOM PROCEDURE TRAY) ×2 IMPLANT
STENT URET 6FRX24 CONTOUR (STENTS) ×1 IMPLANT
SYR 10ML LL (SYRINGE) ×1 IMPLANT
TUBING CONNECTING 10 (TUBING) ×2 IMPLANT

## 2021-10-18 NOTE — Assessment & Plan Note (Signed)
Secondary to sepsis.

## 2021-10-18 NOTE — Assessment & Plan Note (Signed)
Mild left hydroureter with  0.2 mm stone Discussed case with urology Rec: NPO, Flomax, strain all urine, place foley Continue to observe for now and Reassess in AM If decompensates please call back UROLOGY stat

## 2021-10-18 NOTE — Op Note (Addendum)
PATIENT:  Morgan [name]  Preoperative diagnosis:  Urosepsis, ureterolithiasis  Postoperative diagnosis:  Same as above  Procedure:  Cystoscopy Left ureteral stent placement (6 French by 24 cm) no Dangler Left retrograde pyelography with interpretation   Surgeon: Eda Keys, M.D.  Resident Surgeon: Bishop Limbo, MD  Anesthesia: General  Complications: None  EBL: Minimal  Specimens: None  Indication: Morgan [name] is a 71 y.o. female with left ureteral stone with proximal hydronephrosis and UTI. After reviewing the management options for treatment, they have elected to proceed with the above surgical procedure(s). We have discussed the potential benefits and risks of the procedure, side effects of the proposed treatment, the likelihood of the patient achieving the goals of the procedure, and any potential problems that might occur during the procedure or recuperation. Informed consent has been obtained.  Findings: Findings consistent with cystitis in the bladder.  Orthotopic ureteral orifices.  No other abnormal lesions.  Retrograde pyelogram from the distal left ureteral orifice revealed narrowing of the distal left ureter with proximal hydronephrosis consistent with known ureteral stone.  Successful ureteral stent placement.  Description of procedure:   The patient was taken to the operating room and general anesthesia was induced.  The patient was placed in the dorsal lithotomy position, prepped and draped in the usual sterile fashion, and preoperative antibiotics were administered. A preoperative time-out was performed.   Cystourethroscopy was performed.  The patients urethra was examined and was normal. The bladder was then systematically examined in its entirety. There was no evidence for any bladder tumors, stones, or other mucosal pathology.    Attention then turned to the left ureteral orifice and a ureteral catheter was used to intubate the ureteral orifice.   Omnipaque contrast was injected through the ureteral catheter and a retrograde pyelogram which revealed the above findings.  A Sensor guidewire was then advanced up the left ureter into the renal pelvis under fluoroscopic guidance.  The ureteral stent was advance over the wire using Seldinger technique.  The stent was positioned appropriately under fluoroscopic and cystoscopic guidance.  The wire was then removed with an adequate stent curl noted in the renal pelvis as well as in the bladder.  The bladder was left full and Foley catheter was placed as the patient was febrile in the emergency room..  The patient appeared to tolerate the procedure well and without complications.  The patient was able to be awakened and transferred to the recovery unit in satisfactory condition.   I was present and scrubbed for the entire procedure - I spoke to [name] again and went over the procedure, post-op care and follow-up.

## 2021-10-18 NOTE — Anesthesia Postprocedure Evaluation (Signed)
Anesthesia Post Note  Patient: Morgan [name]  Procedure(s) Performed: CYSTOSCOPY WITH RETROGRADE PYELOGRAM, URETERAL STENT PLACEMENT (Left)     Patient location during evaluation: PACU Anesthesia Type: General Level of consciousness: awake Pain management: pain level controlled Respiratory status: spontaneous breathing Cardiovascular status: stable Postop Assessment: no apparent nausea or vomiting Anesthetic complications: no   No notable events documented.  Last Vitals:  Vitals:   10/18/21 0900 10/18/21 0915  BP: 117/87 127/71  Pulse: 95 91  Resp: 17 (!) 21  Temp:  36.9 C  SpO2: 98% 96%    Last Pain:  Vitals:   10/18/21 0915  TempSrc:   PainSc: 0-No pain                 Hajime Asfaw

## 2021-10-18 NOTE — Assessment & Plan Note (Addendum)
-   Possibly from sepsis/infection. -Mental status has much improved.

## 2021-10-18 NOTE — Progress Notes (Signed)
PHARMACY - PHYSICIAN COMMUNICATION CRITICAL VALUE ALERT - BLOOD CULTURE IDENTIFICATION (BCID)  Morgan [name] is an 71 y.o. female who presented to [facility] on 10/17/2021 with a chief complaint of  Chief Complaint  Patient presents with   Dysuria   Abdominal Pain   Altered Mental Status     Assessment:  urosepsis    Name of physician (or Provider) Contacted: Dr. Lonny Prude  Current antibiotics: ceftriaxone  Changes to prescribed antibiotics recommended:  Patient is on recommended antibiotics - No changes needed - Keep on ceftriaxone pending full sensitivities   Results for orders placed or performed during the hospital encounter of 10/17/21  Blood Culture ID Panel (Reflexed) (Collected: 10/17/2021  3:06 PM)  Result Value Ref Range   Enterococcus faecalis NOT DETECTED NOT DETECTED   Enterococcus Faecium NOT DETECTED NOT DETECTED   Listeria monocytogenes NOT DETECTED NOT DETECTED   Staphylococcus species NOT DETECTED NOT DETECTED   Staphylococcus aureus (BCID) NOT DETECTED NOT DETECTED   Staphylococcus epidermidis NOT DETECTED NOT DETECTED   Staphylococcus lugdunensis NOT DETECTED NOT DETECTED   Streptococcus species NOT DETECTED NOT DETECTED   Streptococcus agalactiae NOT DETECTED NOT DETECTED   Streptococcus pneumoniae NOT DETECTED NOT DETECTED   Streptococcus pyogenes NOT DETECTED NOT DETECTED   A.calcoaceticus-baumannii NOT DETECTED NOT DETECTED   Bacteroides fragilis NOT DETECTED NOT DETECTED   Enterobacterales DETECTED (A) NOT DETECTED   Enterobacter cloacae complex NOT DETECTED NOT DETECTED   Escherichia coli DETECTED (A) NOT DETECTED   Klebsiella aerogenes NOT DETECTED NOT DETECTED   Klebsiella oxytoca NOT DETECTED NOT DETECTED   Klebsiella pneumoniae NOT DETECTED NOT DETECTED   Proteus species NOT DETECTED NOT DETECTED   Salmonella species NOT DETECTED NOT DETECTED   Serratia marcescens NOT DETECTED NOT DETECTED   Haemophilus influenzae NOT DETECTED NOT DETECTED    Neisseria meningitidis NOT DETECTED NOT DETECTED   Pseudomonas aeruginosa NOT DETECTED NOT DETECTED   Stenotrophomonas maltophilia NOT DETECTED NOT DETECTED   Candida albicans NOT DETECTED NOT DETECTED   Candida auris NOT DETECTED NOT DETECTED   Candida glabrata NOT DETECTED NOT DETECTED   Candida krusei NOT DETECTED NOT DETECTED   Candida parapsilosis NOT DETECTED NOT DETECTED   Candida tropicalis NOT DETECTED NOT DETECTED   Cryptococcus neoformans/gattii NOT DETECTED NOT DETECTED   CTX-M ESBL NOT DETECTED NOT DETECTED   Carbapenem resistance IMP NOT DETECTED NOT DETECTED   Carbapenem resistance KPC NOT DETECTED NOT DETECTED   Carbapenem resistance NDM NOT DETECTED NOT DETECTED   Carbapenem resist OXA 48 LIKE NOT DETECTED NOT DETECTED   Carbapenem resistance VIM NOT DETECTED NOT DETECTED     Royetta Asal, PharmD, BCPS 10/18/2021 11:40 AM

## 2021-10-18 NOTE — Progress Notes (Signed)
Echocardiogram 2D Echocardiogram has been performed.  Jefferey Pica 10/18/2021, 2:15 PM

## 2021-10-18 NOTE — Progress Notes (Signed)
PROGRESS NOTE    Morgan [name]  JQZ:[id number] DOB: 1951-01-13 DOA: 10/17/2021 PCP: [name], MD   Brief Narrative: Morgan [name] is a 71 y.o. female with a history of parkinson disease, fibromyalgia, LBBB, hyperlipidemia. Patient presented secondary to confusion and fever and found to have evidence of sepsis secondary to UTI with further evidence of an obstructing left ureteral stone. Patient started empirically on Ceftriaxone and Urology consulted. Patient underwent emergent left ureteral stent placement on 2/20. Blood and urine cultures suggest E. Coli bacterial infections.   Assessment and Plan: Hydronephrosis with infection- (present on admission) Secondary to obstructing stone. Patient underwent emergent and successful left ureteral stent placement on 2/20.  UTI (urinary tract infection)- (present on admission) See problem, Severe sepsis  Severe sepsis ([place])- (present on admission) Secondary to E. Coli UTI in setting of nephrolithiasis with obstruction and E. Coli bacteremia. Patient started empirically on Ceftriaxone. Urine and blood cultures suggest E. Coli infection. Tmax of 100.4 F this morning. -Continue Ceftriaxone IV -Follow-up urine/blood culture sensitivities  Hypokalemia- (present on admission) Potassium of 3.3 on BMP today. -Kdur 40 meq PO x2  Tachycardia- (present on admission) Secondary to sepsis.  Parkinson disease ([place])- (present on admission) -Continue Sinemet and Cymbalta  Hyperlipidemia- (present on admission) -Continue Lipitor  LBBB (left bundle branch block)- (present on admission) Chronic. Normal troponin on admission.  Acute metabolic encephalopathy-resolved as of 10/18/2021, (present on admission) Resolved with treatment of infection, IV fluids.     DVT prophylaxis: SCDs Code Status:   Code Status: Full Code Family Communication: None at bedside Disposition Plan: Discharge home in 1-2 days pending culture data, transition to  oral antibiotics, urology recommendations   Consultants:  Urology  Procedures:  CYSTOSCOPY, LEFT URETERAL [place] (10/18/2021)  Antimicrobials: Ceftriaxone IV    Subjective: Patient seen in the PACU. No issues at this time. No abdominal pain.  Objective: BP (!) 145/87    Pulse (!) 101    Temp 98.2 F (36.8 C)    Resp 20    Ht 5\' 6"  (1.676 m)    Wt 79.4 kg    SpO2 94%    BMI 28.25 kg/m   Examination:  General exam: Appears calm and comfortable Respiratory system: Clear to auscultation. Respiratory effort normal. Cardiovascular system: S1 & S2 heard, RRR. No murmurs, rubs, gallops or clicks. Gastrointestinal system: Abdomen is mildly distended, soft and nontender. No organomegaly or masses felt. Normal bowel sounds heard. Central nervous system: Alert and oriented. No focal neurological deficits. Musculoskeletal: No edema. No calf tenderness Skin: No cyanosis. No rashes Psychiatry: Judgement and insight appear normal. Mood & affect appropriate.    Data Reviewed: I have personally reviewed following labs and imaging studies  CBC Lab Results  Component Value Date   WBC 12.2 (H) 10/18/2021   RBC 3.79 (L) 10/18/2021   HGB 10.8 (L) 10/18/2021   HCT 34.4 (L) 10/18/2021   MCV 90.8 10/18/2021   MCH 28.5 10/18/2021   PLT 181 10/18/2021   MCHC 31.4 10/18/2021   RDW 14.2 10/18/2021   LYMPHSABS 1.2 10/18/2021   MONOABS 0.8 10/18/2021   EOSABS 0.0 10/18/2021   BASOSABS 0.0 62/26/3335     Last metabolic panel Lab Results  Component Value Date   NA 135 10/18/2021   K 3.3 (L) 10/18/2021   CL 104 10/18/2021   CO2 24 10/18/2021   BUN 11 10/18/2021   CREATININE 0.67 10/18/2021   GLUCOSE 107 (H) 10/18/2021   GFRNONAA >60 10/18/2021   GFRAA >60  02/05/2018   CALCIUM 8.1 (L) 10/18/2021   PHOS 3.0 10/18/2021   PROT 6.4 (L) 10/18/2021   ALBUMIN 3.1 (L) 10/18/2021   BILITOT 0.8 10/18/2021   ALKPHOS 85 10/18/2021   AST 13 (L) 10/18/2021   ALT 7 10/18/2021    ANIONGAP 7 10/18/2021    GFR: Estimated Creatinine Clearance: 66.5 mL/min (by C-G formula based on SCr of 0.67 mg/dL).  Recent Results (from the past 240 hour(s))  Resp Panel by RT-PCR (Flu A&B, Covid) Peripheral     Status: None   Collection Time: 10/17/21  3:04 PM   Specimen: Peripheral; Nasopharyngeal(NP) swabs in vial transport medium  Result Value Ref Range Status   SARS Coronavirus 2 by RT PCR NEGATIVE NEGATIVE Final    Comment: (NOTE) SARS-CoV-2 target nucleic acids are NOT DETECTED.  The SARS-CoV-2 RNA is generally detectable in upper respiratory specimens during the acute phase of infection. The lowest concentration of SARS-CoV-2 viral copies this assay can detect is 138 copies/mL. A negative result does not preclude SARS-Cov-2 infection and should not be used as the sole basis for treatment or other patient management decisions. A negative result may occur with  improper specimen collection/handling, submission of specimen other than nasopharyngeal swab, presence of viral mutation(s) within the areas targeted by this assay, and inadequate number of viral copies(<138 copies/mL). A negative result must be combined with clinical observations, patient history, and epidemiological information. The expected result is Negative.  Fact Sheet for Patients:  EntrepreneurPulse.com.au  Fact Sheet for Healthcare Providers:  IncredibleEmployment.be  This test is no t yet approved or cleared by the [place] FDA and  has been authorized for detection and/or diagnosis of SARS-CoV-2 by FDA under an Emergency Use Authorization (EUA). This EUA will remain  in effect (meaning this test can be used) for the duration of the COVID-19 declaration under Section 564(b)(1) of the Act, 21 U.S.C.section 360bbb-3(b)(1), unless the authorization is terminated  or revoked sooner.       Influenza A by PCR NEGATIVE NEGATIVE Final   Influenza B by PCR  NEGATIVE NEGATIVE Final    Comment: (NOTE) The Xpert Xpress SARS-CoV-2/FLU/RSV plus assay is intended as an aid in the diagnosis of influenza from Nasopharyngeal swab specimens and should not be used as a sole basis for treatment. Nasal washings and aspirates are unacceptable for Xpert Xpress SARS-CoV-2/FLU/RSV testing.  Fact Sheet for Patients: EntrepreneurPulse.com.au  Fact Sheet for Healthcare Providers: IncredibleEmployment.be  This test is not yet approved or cleared by the [place] FDA and has been authorized for detection and/or diagnosis of SARS-CoV-2 by FDA under an Emergency Use Authorization (EUA). This EUA will remain in effect (meaning this test can be used) for the duration of the COVID-19 declaration under Section 564(b)(1) of the Act, 21 U.S.C. section 360bbb-3(b)(1), unless the authorization is terminated or revoked.  Performed at [facility], [place] [address], [place], [place] [address]   Blood Culture (routine x 2)     Status: None (Preliminary result)   Collection Time: 10/17/21  3:04 PM   Specimen: BLOOD  Result Value Ref Range Status   Specimen Description   Final    BLOOD RIGHT ANTECUBITAL Performed at [facility] [address] [place], [place] [address]    Special Requests   Final    BOTTLES DRAWN AEROBIC AND ANAEROBIC Blood Culture adequate volume Performed at [facility] [address] [place], [place] [address]    Culture   Final  NO GROWTH < 24 HOURS Performed at [facility] [address], [place], [place] [address]    Report Status PENDING  Incomplete  Urine Culture     Status: Abnormal (Preliminary result)   Collection Time: 10/17/21  3:04 PM   Specimen: In/Out Cath Urine  Result Value Ref Range Status   Specimen Description   Final    IN/OUT CATH URINE Performed at [facility] [address],  [place], [place] [address]    Special Requests   Final    NONE Performed at [facility], [place] [address] [place], [place] [address]    Culture >=100,000 COLONIES/mL ESCHERICHIA COLI (A)  Final   Report Status PENDING  Incomplete  Blood Culture (routine x 2)     Status: None (Preliminary result)   Collection Time: 10/17/21  3:06 PM   Specimen: BLOOD  Result Value Ref Range Status   Specimen Description   Final    BLOOD LEFT ANTECUBITAL Performed at [facility] [address] [place], [place] [address]    Special Requests   Final    BOTTLES DRAWN AEROBIC AND ANAEROBIC Blood Culture adequate volume Performed at [facility] [address] [place], [place] [address]    Culture  Setup Time   Final    GRAM NEGATIVE RODS AEROBIC BOTTLE ONLY CRITICAL RESULT CALLED TO, READ BACK BY AND VERIFIED WITH: PHARM D J.LEGGE ON [id number] AT 1035 BY E.PARRISH Performed at [facility], [place] [address], [place], [place] [address]    Culture GRAM NEGATIVE RODS  Final   Report Status PENDING  Incomplete  Blood Culture ID Panel (Reflexed)     Status: Abnormal   Collection Time: 10/17/21  3:06 PM  Result Value Ref Range Status   Enterococcus faecalis NOT DETECTED NOT DETECTED Final   Enterococcus Faecium NOT DETECTED NOT DETECTED Final   Listeria monocytogenes NOT DETECTED NOT DETECTED Final   Staphylococcus species NOT DETECTED NOT DETECTED Final   Staphylococcus aureus (BCID) NOT DETECTED NOT DETECTED Final   Staphylococcus epidermidis NOT DETECTED NOT DETECTED Final   Staphylococcus lugdunensis NOT DETECTED NOT DETECTED Final   Streptococcus species NOT DETECTED NOT DETECTED Final   Streptococcus agalactiae NOT DETECTED NOT DETECTED Final   Streptococcus pneumoniae NOT DETECTED NOT DETECTED Final   Streptococcus pyogenes NOT DETECTED NOT DETECTED Final   A.calcoaceticus-baumannii NOT DETECTED NOT DETECTED Final   Bacteroides fragilis  NOT DETECTED NOT DETECTED Final   Enterobacterales DETECTED (A) NOT DETECTED Final    Comment: Enterobacterales represent a large order of gram negative bacteria, not a single organism. CRITICAL RESULT CALLED TO, READ BACK BY AND VERIFIED WITH: PHARM D J.LEGGE ON [id number] AT 1275 BY E.PARRISH    Enterobacter cloacae complex NOT DETECTED NOT DETECTED Final   Escherichia coli DETECTED (A) NOT DETECTED Final    Comment: CRITICAL RESULT CALLED TO, READ BACK BY AND VERIFIED WITH: PHARM D J.LEGGE ON [id number] AT 4496 BY E.PARRISH    Klebsiella aerogenes NOT DETECTED NOT DETECTED Final   Klebsiella oxytoca NOT DETECTED NOT DETECTED Final   Klebsiella pneumoniae NOT DETECTED NOT DETECTED Final   Proteus species NOT DETECTED NOT DETECTED Final   Salmonella species NOT DETECTED NOT DETECTED Final   Serratia marcescens NOT DETECTED NOT DETECTED Final   Haemophilus influenzae NOT DETECTED NOT DETECTED Final   Neisseria meningitidis NOT DETECTED NOT DETECTED Final   Pseudomonas aeruginosa NOT DETECTED NOT DETECTED Final  Stenotrophomonas maltophilia NOT DETECTED NOT DETECTED Final   Candida albicans NOT DETECTED NOT DETECTED Final   Candida auris NOT DETECTED NOT DETECTED Final   Candida glabrata NOT DETECTED NOT DETECTED Final   Candida krusei NOT DETECTED NOT DETECTED Final   Candida parapsilosis NOT DETECTED NOT DETECTED Final   Candida tropicalis NOT DETECTED NOT DETECTED Final   Cryptococcus neoformans/gattii NOT DETECTED NOT DETECTED Final   CTX-M ESBL NOT DETECTED NOT DETECTED Final   Carbapenem resistance IMP NOT DETECTED NOT DETECTED Final   Carbapenem resistance KPC NOT DETECTED NOT DETECTED Final   Carbapenem resistance NDM NOT DETECTED NOT DETECTED Final   Carbapenem resist OXA 48 LIKE NOT DETECTED NOT DETECTED Final   Carbapenem resistance VIM NOT DETECTED NOT DETECTED Final    Comment: Performed at [facility], [address], [place], [place] [address]      Radiology  Studies: CT Angio Chest Pulmonary Embolism (PE) W or WO Contrast  Result Date: 10/17/2021 CLINICAL DATA:  PE suspected.  Evaluate for sepsis. EXAM: CT CHEST, ABDOMEN, AND PELVIS WITH CONTRAST TECHNIQUE: Multidetector CT imaging of the chest, abdomen and pelvis was performed following the standard protocol during bolus administration of intravenous contrast. RADIATION DOSE REDUCTION: This exam was performed according to the departmental dose-optimization program which includes automated exposure control, adjustment of the mA and/or kV according to patient size and/or use of iterative reconstruction technique. CONTRAST:  142mL OMNIPAQUE IOHEXOL 350 MG/ML  SOLN COMPARISON:  CT chest 09/03/2008 FINDINGS: CT CHEST FINDINGS Cardiovascular: Satisfactory opacification to the level of the segmental pulmonary arteries. No pulmonary embolism. Normal heart size. No pericardial effusion. Aortic calcifications. Mediastinum/Nodes: No enlarged mediastinal, hilar, or axillary lymph nodes. Thyroid gland, trachea, and esophagus demonstrate no significant findings. Lungs/Pleura: Lungs are clear. Minimal dependent subsegmental atelectasis. No pleural effusion or pneumothorax. Musculoskeletal: No chest wall mass or suspicious bone lesions identified. CT ABDOMEN PELVIS FINDINGS Hepatobiliary: No focal liver abnormality is seen. Several gallstones measuring up to 1.7 cm. No gallbladder wall thickening or pericholecystic fluid. The extrahepatic bile duct is dilated measuring 1.0 cm in diameter (series 2, image 29; series 5, image 41). No discrete stone identified within the extrahepatic bile duct. No intrahepatic bile duct dilatation. Pancreas: Unremarkable. No pancreatic ductal dilatation or surrounding inflammatory changes. Spleen: Normal in size without focal abnormality. Adrenals/Urinary Tract: Adrenal glands are unremarkable. Mild left hydroureteronephrosis secondary to a 0.2 cm stone in the distal third of the left ureter (series  2, image 73). Normal right kidney. Normal appearance of the mildly distended urinary bladder. Stomach/Bowel: Stomach is within normal limits. The appendix is not directly visualized, however no pericecal inflammatory changes. Diverticulosis of the sigmoid colon. No evidence of bowel wall thickening, distention, or inflammatory changes. Vascular/Lymphatic: Aortic atherosclerosis. No enlarged abdominal or pelvic lymph nodes. Reproductive: Uterus and bilateral adnexa are unremarkable. Other: No abdominal wall hernia or abnormality. No abdominopelvic ascites. Musculoskeletal: Rightward curvature of the lumbar spine centered at the L1-L2 level. Multilevel degenerative disc disease in the lumbar spine, most severe at L1-L2 and L2-L3. No acute or suspicious osseous findings. IMPRESSION: CTA chest: 1. No pulmonary embolism. 2. Lungs are clear. 3.  Aortic Atherosclerosis (ICD10-I70.0). CT abdomen/pelvis: 1. Mild left hydroureteronephrosis secondary to a 0.2 cm stone in the distal third of the left ureter. 2. Cholelithiasis without findings of cholecystitis. 3. Sigmoid diverticulosis without findings of diverticulitis. Electronically Signed   By: Ileana Roup M.D.   On: 10/17/2021 21:34   CT ABDOMEN PELVIS W CONTRAST  Result Date: 10/17/2021 CLINICAL DATA:  PE suspected.  Evaluate for sepsis. EXAM: CT CHEST, ABDOMEN, AND PELVIS WITH CONTRAST TECHNIQUE: Multidetector CT imaging of the chest, abdomen and pelvis was performed following the standard protocol during bolus administration of intravenous contrast. RADIATION DOSE REDUCTION: This exam was performed according to the departmental dose-optimization program which includes automated exposure control, adjustment of the mA and/or kV according to patient size and/or use of iterative reconstruction technique. CONTRAST:  134mL OMNIPAQUE IOHEXOL 350 MG/ML  SOLN COMPARISON:  CT chest 09/03/2008 FINDINGS: CT CHEST FINDINGS Cardiovascular: Satisfactory opacification to the level  of the segmental pulmonary arteries. No pulmonary embolism. Normal heart size. No pericardial effusion. Aortic calcifications. Mediastinum/Nodes: No enlarged mediastinal, hilar, or axillary lymph nodes. Thyroid gland, trachea, and esophagus demonstrate no significant findings. Lungs/Pleura: Lungs are clear. Minimal dependent subsegmental atelectasis. No pleural effusion or pneumothorax. Musculoskeletal: No chest wall mass or suspicious bone lesions identified. CT ABDOMEN PELVIS FINDINGS Hepatobiliary: No focal liver abnormality is seen. Several gallstones measuring up to 1.7 cm. No gallbladder wall thickening or pericholecystic fluid. The extrahepatic bile duct is dilated measuring 1.0 cm in diameter (series 2, image 29; series 5, image 41). No discrete stone identified within the extrahepatic bile duct. No intrahepatic bile duct dilatation. Pancreas: Unremarkable. No pancreatic ductal dilatation or surrounding inflammatory changes. Spleen: Normal in size without focal abnormality. Adrenals/Urinary Tract: Adrenal glands are unremarkable. Mild left hydroureteronephrosis secondary to a 0.2 cm stone in the distal third of the left ureter (series 2, image 73). Normal right kidney. Normal appearance of the mildly distended urinary bladder. Stomach/Bowel: Stomach is within normal limits. The appendix is not directly visualized, however no pericecal inflammatory changes. Diverticulosis of the sigmoid colon. No evidence of bowel wall thickening, distention, or inflammatory changes. Vascular/Lymphatic: Aortic atherosclerosis. No enlarged abdominal or pelvic lymph nodes. Reproductive: Uterus and bilateral adnexa are unremarkable. Other: No abdominal wall hernia or abnormality. No abdominopelvic ascites. Musculoskeletal: Rightward curvature of the lumbar spine centered at the L1-L2 level. Multilevel degenerative disc disease in the lumbar spine, most severe at L1-L2 and L2-L3. No acute or suspicious osseous findings.  IMPRESSION: CTA chest: 1. No pulmonary embolism. 2. Lungs are clear. 3.  Aortic Atherosclerosis (ICD10-I70.0). CT abdomen/pelvis: 1. Mild left hydroureteronephrosis secondary to a 0.2 cm stone in the distal third of the left ureter. 2. Cholelithiasis without findings of cholecystitis. 3. Sigmoid diverticulosis without findings of diverticulitis. Electronically Signed   By: Ileana Roup M.D.   On: 10/17/2021 21:34   DG Chest Port 1 View  Result Date: 10/17/2021 CLINICAL DATA:  Questionable sepsis, foul-smelling urine, history of UTI EXAM: PORTABLE CHEST 1 VIEW COMPARISON:  12/21/2015 FINDINGS: Cardiomegaly. Both lungs are clear. The visualized skeletal structures are unremarkable. IMPRESSION: Cardiomegaly without acute abnormality of the lungs in AP portable projection. Electronically Signed   By: Delanna Ahmadi M.D.   On: 10/17/2021 15:13   DG C-Arm 1-60 Min-No Report  Result Date: 10/18/2021 Fluoroscopy was utilized by the requesting physician.  No radiographic interpretation.      LOS: 0 days    Cordelia Poche, MD [facility] 10/18/2021, 2:46 PM   If 7PM-7AM, please contact night-coverage [email or web address]

## 2021-10-18 NOTE — Anesthesia Preprocedure Evaluation (Signed)
Anesthesia Evaluation  Patient identified by MRN, date of birth, ID band Patient awake    Reviewed: Allergy & Precautions, NPO status , Patient's Chart, lab work & pertinent test results  Airway Mallampati: II  TM Distance: >3 FB     Dental   Pulmonary former smoker,    breath sounds clear to auscultation       Cardiovascular hypertension,  Rhythm:Regular Rate:Normal     Neuro/Psych    GI/Hepatic negative GI ROS, Neg liver ROS,   Endo/Other    Renal/GU Renal disease     Musculoskeletal   Abdominal   Peds  Hematology   Anesthesia Other Findings   Reproductive/Obstetrics                             Anesthesia Physical Anesthesia Plan  ASA: 3  Anesthesia Plan: General   Post-op Pain Management:    Induction: Intravenous  PONV Risk Score and Plan: 3  Airway Management Planned: Oral ETT  Additional Equipment:   Intra-op Plan:   Post-operative Plan: Extubation in OR  Informed Consent: I have reviewed the patients History and Physical, chart, labs and discussed the procedure including the risks, benefits and alternatives for the proposed anesthesia with the patient or authorized representative who has indicated his/her understanding and acceptance.     Dental advisory given  Plan Discussed with:   Anesthesia Plan Comments:         Anesthesia Quick Evaluation

## 2021-10-18 NOTE — Assessment & Plan Note (Addendum)
E. Coli UTI bacteremia Left ureterolithiasis with obstruction with hydronephrosis/acute pyelonephritis -underwent emergent and successful left ureteral stent placement on 10/18/2021.  Foley catheter removed on 10/19/2021.  Outpatient follow-up with urology. -Currently on Rocephin.  Blood and urine culture growing E. coli.  -Sepsis has resolved. -Discharge home today on oral Keflex for 7 more days with close outpatient follow-up with PCP and urology.

## 2021-10-18 NOTE — Progress Notes (Signed)
OT Cancellation Note  Patient Details Name: Tyleah Loh MRN: [id number] DOB: [birth year]   Cancelled Treatment:    Reason Eval/Treat Not Completed: Patient not medically ready;Patient at procedure or test/ unavailable Per chart, pt in PACU following [place] this morning.   OT will continue efforts.   Julien Girt 10/18/2021, 10:35 AM

## 2021-10-18 NOTE — Assessment & Plan Note (Addendum)
-  Continue Sinemet and Cymbalta

## 2021-10-18 NOTE — Consult Note (Signed)
Urology Consult Note   Requesting Attending Physician:  Mariel Aloe, MD Service Providing Consult: Urology   Reason for Consult:  Nephrolithiasis  HPI: Morgan [name] is seen in consultation for reasons noted above at the request of Mariel Aloe, MD for evaluation of urolithiasis in setting of UTI  She notes that she has had symptoms for 2 days, including foul-smelling urine, cloudy urine which then developed over the past day into confusion per her accompanying husband.  She denies gross hematuria.  Lower abdominal pain per the husband but has not been able to take her medications at home.  I am unable to ascertain clearly whether the patient was having dysuria or other urinary symptoms based on her history..  Temperature of 37.8 yesterday afternoon which broke to 36.6 at 10 PM.  This morning at 5 AM again she is febrile to 38 Celsius.  Slightly tachycardic with heart rate in the 100s.  She is normotensive.  No obvious evidence of an AKI.  Presented with leukocytosis to 14.5 which has since downtrend to 12.  Urinalysis is floridly concerning for infection with positive leukocytes, nitrites, bacteria.  CT scan obtained revealing 2 mm distal left ureteral stone with proximal hydroureteronephrosis and stranding.  She has no previous history of kidney stones.      Past Medical History: Past Medical History:  Diagnosis Date   Anxiety    Fibromyalgia    Hypercholesteremia    controlled on medication   Hyperlipidemia    Hypertension    no current meds   LBBB (left bundle branch block)    Melanoma (HCC)    MVP (mitral valve prolapse)    Osteopenia    Parkinson disease (HCC)    SVD (spontaneous vaginal delivery)    x 2    Past Surgical History:  Past Surgical History:  Procedure Laterality Date   BLEPHAROPLASTY     cold knife conization     COLONOSCOPY     FH colon CA   DILATATION & CURETTAGE/HYSTEROSCOPY WITH MYOSURE N/A 02/14/2018   Procedure: ATTEMPTED DILATATION &  CURETTAGE/HYSTEROSCOPY WITH MYOSURE;  Surgeon: Christophe Louis, MD;  Location: [facility];  Service: Gynecology;  Laterality: N/A;  polyp   melanoma cancer     removed from left leg   TONSILLECTOMY     TUBAL LIGATION     WISDOM TOOTH EXTRACTION      Medication: Current Facility-Administered Medications  Medication Dose Route Frequency Provider Last Rate Last Admin   atorvastatin (LIPITOR) tablet 10 mg  10 mg Oral q AM Doutova, Anastassia, MD       carbidopa-levodopa (SINEMET IR) 25-100 MG per tablet immediate release 1 tablet  1 tablet Oral 8 times per day Toy Baker, MD   1 tablet at 10/18/21 0611   cefTRIAXone (ROCEPHIN) 2 g in sodium chloride 0.9 % 100 mL IVPB  2 g Intravenous Q24H Doutova, Anastassia, MD   Stopped at 10/17/21 1608   DULoxetine (CYMBALTA) DR capsule 60 mg  60 mg Oral Daily Doutova, Anastassia, MD       ibuprofen (ADVIL) tablet 400 mg  400 mg Oral Q6H PRN Doutova, Anastassia, MD   400 mg at 10/18/21 0556   lactated ringers infusion   Intravenous Continuous Toy Baker, MD   Stopped at 10/17/21 2233   LORazepam (ATIVAN) injection 0.5 mg  0.5 mg Intravenous Q6H PRN Toy Baker, MD   0.5 mg at 10/17/21 2233   midazolam (VERSED) injection 1 mg  1 mg Intravenous Once Doutova,  Nyoka Lint, MD       ramelteon (ROZEREM) tablet 8 mg  8 mg Oral QHS Doutova, Anastassia, MD       tamsulosin (FLOMAX) capsule 0.4 mg  0.4 mg Oral QPC supper Toy Baker, MD   0.4 mg at 10/18/21 0104   Current Outpatient Medications  Medication Sig Dispense Refill   ALPRAZolam (XANAX) 0.25 MG tablet Take 0.25 mg by mouth daily as needed for anxiety.      aspirin 325 MG tablet Take 325-650 mg by mouth daily as needed (for headaches/pain.).      atorvastatin (LIPITOR) 10 MG tablet Take 10 mg by mouth in the morning.     carbidopa-levodopa (SINEMET IR) 25-100 MG tablet TAKE 1 TABLET EVERY 2 HOURSUP TO 8 TABLETS PER DAY (Patient taking differently: TAKE 1 TABLET EVERY 2 HOURS UP TO 8  TABLETS PER DAY) 720 tablet 1   Cholecalciferol (VITAMIN D-3) 5000 units TABS Take 5,000 Units by mouth 2 (two) times daily.     Coenzyme Q10-Vitamin E (QUNOL ULTRA COQ10 PO) Take 1 capsule by mouth daily.     CYMBALTA 60 MG capsule TAKE 1 CAPSULE DAILY 90 capsule 1   gabapentin (NEURONTIN) 400 MG capsule TAKE 1 CAPSULE 3 TIMES A   DAY (Patient taking differently: Take 400 mg by mouth 3 (three) times daily as needed (pain).) 270 capsule 1   methocarbamol (ROBAXIN) 750 MG tablet Take 750 mg by mouth every 6 (six) hours as needed for muscle spasms.     Multiple Vitamins-Minerals (PRESERVISION AREDS 2 PO) Take 1 tablet by mouth 2 (two) times daily.     raloxifene (EVISTA) 60 MG tablet Take 60 mg by mouth daily.     ramelteon (ROZEREM) 8 MG tablet TAKE 1 TABLET AT BEDTIME (Patient taking differently: 8 mg at bedtime.) 90 tablet 1   AMBULATORY NON FORMULARY MEDICATION Lift chair Dx:  G20 1 Device 0   Pramipexole Dihydrochloride 0.75 MG TB24 TAKE 1 TABLET DAILY (Patient not taking: Reported on 10/17/2021) 90 tablet 1    Allergies: Allergies  Allergen Reactions   Other Anaphylaxis    "Anti Seizure Medications"   Phenergan [Promethazine Hcl] Other (See Comments)    Makes Parkinson worse   Sporanox [Itraconazole] Hives   Tylenol [Acetaminophen] Other (See Comments)    "I just get weird"    Social History: Social History   Tobacco Use   Smoking status: Former    Packs/day: 0.25    Types: Cigarettes    Quit date: 09/29/1997    Years since quitting: 24.0   Smokeless tobacco: Never   Tobacco comments:    chews [place], 10-12/day  Vaping Use   Vaping Use: Never used  Substance Use Topics   Alcohol use: Yes    Comment: Occasional wine   Drug use: No    Family History Family History  Problem Relation Age of Onset   Cancer Mother        colon   Healthy Brother    Healthy Sister    Healthy Child     Review of Systems 10 systems were reviewed and are negative except as noted  specifically in the HPI.  Objective   Vital signs in last 24 hours: BP (!) 158/89    Pulse 83    Temp (!) 100.4 F (38 C) (Oral)    Resp 20    Ht 5\' 6"  (1.676 m)    Wt 79.4 kg    SpO2 98%    BMI 28.25  kg/m   Physical Exam General: NAD, A&O, resting, appropriate HEENT: [place]/AT, EOMI, MMM Pulmonary: Normal work of breathing Cardiovascular: HDS, adequate peripheral perfusion Abdomen: Soft, NTTP, nondistended GU: Foley in place with clear yellow urine output Extremities: warm and well perfused Neuro: Appropriate, no focal neurological deficits  Most Recent Labs: Lab Results  Component Value Date   WBC 12.2 (H) 10/18/2021   HGB 10.8 (L) 10/18/2021   HCT 34.4 (L) 10/18/2021   PLT 181 10/18/2021    Lab Results  Component Value Date   NA 135 10/18/2021   K 3.3 (L) 10/18/2021   CL 104 10/18/2021   CO2 24 10/18/2021   BUN 11 10/18/2021   CREATININE 0.67 10/18/2021   CALCIUM 8.1 (L) 10/18/2021   MG 1.8 10/18/2021   PHOS 3.0 10/18/2021    Lab Results  Component Value Date   INR 1.2 10/17/2021   APTT 39 (H) 10/17/2021     Urine Culture: @[id number] (laburin,org,[id number])@   IMAGING: CT Angio Chest Pulmonary Embolism (PE) W or WO Contrast  Result Date: 10/17/2021 CLINICAL DATA:  PE suspected.  Evaluate for sepsis. EXAM: CT CHEST, ABDOMEN, AND PELVIS WITH CONTRAST TECHNIQUE: Multidetector CT imaging of the chest, abdomen and pelvis was performed following the standard protocol during bolus administration of intravenous contrast. RADIATION DOSE REDUCTION: This exam was performed according to the departmental dose-optimization program which includes automated exposure control, adjustment of the mA and/or kV according to patient size and/or use of iterative reconstruction technique. CONTRAST:  136mL OMNIPAQUE IOHEXOL 350 MG/ML  SOLN COMPARISON:  CT chest 09/03/2008 FINDINGS: CT CHEST FINDINGS Cardiovascular: Satisfactory opacification to the level of the segmental pulmonary  arteries. No pulmonary embolism. Normal heart size. No pericardial effusion. Aortic calcifications. Mediastinum/Nodes: No enlarged mediastinal, hilar, or axillary lymph nodes. Thyroid gland, trachea, and esophagus demonstrate no significant findings. Lungs/Pleura: Lungs are clear. Minimal dependent subsegmental atelectasis. No pleural effusion or pneumothorax. Musculoskeletal: No chest wall mass or suspicious bone lesions identified. CT ABDOMEN PELVIS FINDINGS Hepatobiliary: No focal liver abnormality is seen. Several gallstones measuring up to 1.7 cm. No gallbladder wall thickening or pericholecystic fluid. The extrahepatic bile duct is dilated measuring 1.0 cm in diameter (series 2, image 29; series 5, image 41). No discrete stone identified within the extrahepatic bile duct. No intrahepatic bile duct dilatation. Pancreas: Unremarkable. No pancreatic ductal dilatation or surrounding inflammatory changes. Spleen: Normal in size without focal abnormality. Adrenals/Urinary Tract: Adrenal glands are unremarkable. Mild left hydroureteronephrosis secondary to a 0.2 cm stone in the distal third of the left ureter (series 2, image 73). Normal right kidney. Normal appearance of the mildly distended urinary bladder. Stomach/Bowel: Stomach is within normal limits. The appendix is not directly visualized, however no pericecal inflammatory changes. Diverticulosis of the sigmoid colon. No evidence of bowel wall thickening, distention, or inflammatory changes. Vascular/Lymphatic: Aortic atherosclerosis. No enlarged abdominal or pelvic lymph nodes. Reproductive: Uterus and bilateral adnexa are unremarkable. Other: No abdominal wall hernia or abnormality. No abdominopelvic ascites. Musculoskeletal: Rightward curvature of the lumbar spine centered at the L1-L2 level. Multilevel degenerative disc disease in the lumbar spine, most severe at L1-L2 and L2-L3. No acute or suspicious osseous findings. IMPRESSION: CTA chest: 1. No  pulmonary embolism. 2. Lungs are clear. 3.  Aortic Atherosclerosis (ICD10-I70.0). CT abdomen/pelvis: 1. Mild left hydroureteronephrosis secondary to a 0.2 cm stone in the distal third of the left ureter. 2. Cholelithiasis without findings of cholecystitis. 3. Sigmoid diverticulosis without findings of diverticulitis. Electronically Signed   By: Nadeen Landau.D.  On: 10/17/2021 21:34   CT ABDOMEN PELVIS W CONTRAST  Result Date: 10/17/2021 CLINICAL DATA:  PE suspected.  Evaluate for sepsis. EXAM: CT CHEST, ABDOMEN, AND PELVIS WITH CONTRAST TECHNIQUE: Multidetector CT imaging of the chest, abdomen and pelvis was performed following the standard protocol during bolus administration of intravenous contrast. RADIATION DOSE REDUCTION: This exam was performed according to the departmental dose-optimization program which includes automated exposure control, adjustment of the mA and/or kV according to patient size and/or use of iterative reconstruction technique. CONTRAST:  149mL OMNIPAQUE IOHEXOL 350 MG/ML  SOLN COMPARISON:  CT chest 09/03/2008 FINDINGS: CT CHEST FINDINGS Cardiovascular: Satisfactory opacification to the level of the segmental pulmonary arteries. No pulmonary embolism. Normal heart size. No pericardial effusion. Aortic calcifications. Mediastinum/Nodes: No enlarged mediastinal, hilar, or axillary lymph nodes. Thyroid gland, trachea, and esophagus demonstrate no significant findings. Lungs/Pleura: Lungs are clear. Minimal dependent subsegmental atelectasis. No pleural effusion or pneumothorax. Musculoskeletal: No chest wall mass or suspicious bone lesions identified. CT ABDOMEN PELVIS FINDINGS Hepatobiliary: No focal liver abnormality is seen. Several gallstones measuring up to 1.7 cm. No gallbladder wall thickening or pericholecystic fluid. The extrahepatic bile duct is dilated measuring 1.0 cm in diameter (series 2, image 29; series 5, image 41). No discrete stone identified within the extrahepatic  bile duct. No intrahepatic bile duct dilatation. Pancreas: Unremarkable. No pancreatic ductal dilatation or surrounding inflammatory changes. Spleen: Normal in size without focal abnormality. Adrenals/Urinary Tract: Adrenal glands are unremarkable. Mild left hydroureteronephrosis secondary to a 0.2 cm stone in the distal third of the left ureter (series 2, image 73). Normal right kidney. Normal appearance of the mildly distended urinary bladder. Stomach/Bowel: Stomach is within normal limits. The appendix is not directly visualized, however no pericecal inflammatory changes. Diverticulosis of the sigmoid colon. No evidence of bowel wall thickening, distention, or inflammatory changes. Vascular/Lymphatic: Aortic atherosclerosis. No enlarged abdominal or pelvic lymph nodes. Reproductive: Uterus and bilateral adnexa are unremarkable. Other: No abdominal wall hernia or abnormality. No abdominopelvic ascites. Musculoskeletal: Rightward curvature of the lumbar spine centered at the L1-L2 level. Multilevel degenerative disc disease in the lumbar spine, most severe at L1-L2 and L2-L3. No acute or suspicious osseous findings. IMPRESSION: CTA chest: 1. No pulmonary embolism. 2. Lungs are clear. 3.  Aortic Atherosclerosis (ICD10-I70.0). CT abdomen/pelvis: 1. Mild left hydroureteronephrosis secondary to a 0.2 cm stone in the distal third of the left ureter. 2. Cholelithiasis without findings of cholecystitis. 3. Sigmoid diverticulosis without findings of diverticulitis. Electronically Signed   By: Ileana Roup M.D.   On: 10/17/2021 21:34   DG Chest Port 1 View  Result Date: 10/17/2021 CLINICAL DATA:  Questionable sepsis, foul-smelling urine, history of UTI EXAM: PORTABLE CHEST 1 VIEW COMPARISON:  12/21/2015 FINDINGS: Cardiomegaly. Both lungs are clear. The visualized skeletal structures are unremarkable. IMPRESSION: Cardiomegaly without acute abnormality of the lungs in AP portable projection. Electronically Signed   By:  Delanna Ahmadi M.D.   On: 10/17/2021 15:13    ------  Assessment: 71 y.o. female with Parkinsons disease and left bundle branch heart block not on anticoagulation medicine who presented with cloudy urine, confusion and CT that demonstrates 20mm left distal obstructing stone with  associated hydro ureteronephrosis.   Concerning for infection given: Urinalysis with nitrites, leukocyte esterase, bacteria, febrile in the ED, tachycardia In the ED received: Ceftriaxone, 1 L of crystalloid, been on 150 cc/h of lactated Ringer's  We discussed the indications for acute intervention including infected obstruction, bilateral ureteral obstruction or unilateral obstruction of solitary kidney as  well as other less urgent indications for decompression which would included intractable pain, N/V, and acute renal injury. We also discussed the possible inability to place a ureteral stent in which case, the next intervention recommended would be a percutaneous nephrostomy tube. Given obstruction and infection is present, plan for emergent left ureteral stent placement.  Recommendations: Plan for emergent  left ureteral stent placement Case posted; Urology will discuss with anesthesia Keep patient NPO Consent obtained from patient Continue ceftriaxone for antiboitics Resuscitation per primary team IV fluid for sepsis protocol Place Foley for maximal urinary decompression    Thank you for this consult. Please contact the urology consult pager with any further questions/concerns.

## 2021-10-18 NOTE — Anesthesia Procedure Notes (Signed)
Procedure Name: Intubation Date/Time: 10/18/2021 7:40 AM Performed by: Claudia Desanctis, CRNA Pre-anesthesia Checklist: Patient identified, Emergency Drugs available, Suction available and Patient being monitored Patient Re-evaluated:Patient Re-evaluated prior to induction Oxygen Delivery Method: Circle system utilized Preoxygenation: Pre-oxygenation with 100% oxygen Induction Type: IV induction Ventilation: Mask ventilation without difficulty Laryngoscope Size: 2 and Miller Grade View: Grade I Tube type: Oral Tube size: 7.0 mm Number of attempts: 1 Airway Equipment and Method: Stylet Placement Confirmation: ETT inserted through vocal cords under direct vision, positive ETCO2 and breath sounds checked- equal and bilateral Secured at: 21 cm Tube secured with: Tape Dental Injury: Teeth and Oropharynx as per pre-operative assessment

## 2021-10-18 NOTE — Assessment & Plan Note (Deleted)
Secondary to obstructing stone. Patient underwent emergent and successful left ureteral stent placement on 2/20. Foley catheter placed on 2/20. -Urology recommendations: pending today

## 2021-10-18 NOTE — Assessment & Plan Note (Deleted)
See problem, Severe sepsis

## 2021-10-18 NOTE — Hospital Course (Addendum)
71 y.o. female with a history of parkinson's disease, fibromyalgia, LBBB, hyperlipidemia presented secondary to confusion and fever and was found to have evidence of sepsis secondary to UTI with further evidence of an obstructing left ureteral stone. She was started empirically on Ceftriaxone and Urology was consulted. Patient underwent emergent left ureteral stent placement on 10/18/2021.  She was found to have E. coli bacteremia.  Subsequently, she has remained stable hemodynamically and tolerated diet.  She will be discharged home today on oral Keflex with outpatient follow-up with PCP and urology.

## 2021-10-18 NOTE — Assessment & Plan Note (Signed)
-  Continue Lipitor °

## 2021-10-18 NOTE — Assessment & Plan Note (Addendum)
Potassium of 3.3 on BMP on 2/20. Potassium of 4.1 on BMP today. Resolved with supplementation.

## 2021-10-18 NOTE — Plan of Care (Signed)

## 2021-10-18 NOTE — Assessment & Plan Note (Addendum)
-  Chronic. Normal troponin on admission. -Outpatient follow-up with Dr. Croitoru/cardiology

## 2021-10-18 NOTE — Progress Notes (Signed)
PT Cancellation Note  Patient Details Name: Morgan [name] MRN: [id number] DOB: 1951-01-06   Cancelled Treatment:    Reason Eval/Treat Not Completed: Medical issues which prohibited therapy, plans for emergent ureteral stent placement.  Will check back another time post procedure. [place] Pager [phone] Office [phone]    Claretha Cooper 10/18/2021, 7:06 AM

## 2021-10-18 NOTE — Transfer of Care (Signed)
Immediate Anesthesia Transfer of Care Note  Patient: Morgan [name]  Procedure(s) Performed: CYSTOSCOPY WITH RETROGRADE PYELOGRAM, URETERAL STENT PLACEMENT (Left)  Patient Location: PACU  Anesthesia Type:General  Level of Consciousness: drowsy  Airway & Oxygen Therapy: Patient Spontanous Breathing and Patient connected to face mask  Post-op Assessment: Report given to RN and Post -op Vital signs reviewed and stable  Post vital signs: Reviewed and stable  Last Vitals:  Vitals Value Taken Time  BP 132/73 10/18/21 0813  Temp    Pulse 87 10/18/21 0813  Resp 13 10/18/21 0813  SpO2 100 % 10/18/21 0813  Vitals shown include unvalidated device data.  Last Pain:  Vitals:   10/18/21 0550  TempSrc: Oral  PainSc:          Complications: No notable events documented.

## 2021-10-19 ENCOUNTER — Encounter (HOSPITAL_COMMUNITY): Payer: Self-pay | Admitting: Urology

## 2021-10-19 DIAGNOSIS — G9341 Metabolic encephalopathy: Secondary | ICD-10-CM | POA: Diagnosis not present

## 2021-10-19 DIAGNOSIS — E876 Hypokalemia: Secondary | ICD-10-CM | POA: Diagnosis not present

## 2021-10-19 DIAGNOSIS — N136 Pyonephrosis: Secondary | ICD-10-CM | POA: Diagnosis not present

## 2021-10-19 DIAGNOSIS — R7881 Bacteremia: Secondary | ICD-10-CM | POA: Diagnosis not present

## 2021-10-19 DIAGNOSIS — R410 Disorientation, unspecified: Secondary | ICD-10-CM

## 2021-10-19 DIAGNOSIS — R030 Elevated blood-pressure reading, without diagnosis of hypertension: Secondary | ICD-10-CM

## 2021-10-19 LAB — URINE CULTURE: Culture: >=100000 — AB

## 2021-10-19 LAB — CBC
HCT: 35.6 % — ABNORMAL LOW (ref 36.0–46.0)
Hemoglobin: 11.3 g/dL — ABNORMAL LOW (ref 12.0–15.0)
MCH: 28.6 pg (ref 26.0–34.0)
MCHC: 31.7 g/dL (ref 30.0–36.0)
MCV: 90.1 fL (ref 80.0–100.0)
Platelets: 203 10*3/uL (ref 150–400)
RBC: 3.95 MIL/uL (ref 3.87–5.11)
RDW: 13.6 % (ref 11.5–15.5)
WBC: 11.6 10*3/uL — ABNORMAL HIGH (ref 4.0–10.5)
nRBC: 0 % (ref 0.0–0.2)

## 2021-10-19 LAB — POTASSIUM: Potassium: 4.1 mmol/L (ref 3.5–5.1)

## 2021-10-19 MED ORDER — AMLODIPINE BESYLATE 5 MG PO TABS
5.0000 mg | ORAL_TABLET | Freq: Every day | ORAL | Status: DC
  Administered 2021-10-19: 5 mg via ORAL
  Filled 2021-10-19: qty 1

## 2021-10-19 NOTE — Plan of Care (Deleted)
°  Problem: Education: Goal: Knowledge of General Education information will improve Description: Including pain rating scale, medication(s)/side effects and non-pharmacologic comfort measures Outcome: Progressing   Problem: Activity: Goal: Risk for activity intolerance will decrease Outcome: Progressing   Problem: Elimination: Goal: Will not experience complications related to bowel motility Outcome: Progressing Goal: Will not experience complications related to urinary retention Outcome: Progressing   Problem: Pain Managment: Goal: General experience of comfort will improve Outcome: Progressing   Problem: Safety: Goal: Ability to remain free from injury will improve Outcome: Progressing   Problem: Skin Integrity: Goal: Risk for impaired skin integrity will decrease Outcome: Progressing   Problem: Respiratory: Goal: Ability to maintain adequate ventilation will improve Outcome: Progressing

## 2021-10-19 NOTE — TOC Initial Note (Signed)
Transition of Care [facility]) - Initial/Assessment Note    Patient Details  Name: Morgan [name] MRN: [id number] Date of Birth: 10/07/1950  [facility] CM/SW Contact:    Leeroy Cha, RN Phone Number: 10/19/2021, 9:40 AM  Clinical Narrative:                  Transition of Care [facility]) Screening Note   Patient Details  Name: Morgan [name] Date of Birth: [birth year]   Transition of Care [facility]) CM/SW Contact:    Leeroy Cha, RN Phone Number: 10/19/2021, 9:40 AM    Transition of Care Department [facility]) has reviewed patient and no TOC needs have been identified at this time. We will continue to monitor patient advancement through interdisciplinary progression rounds. If new patient transition needs arise, please place a TOC consult.    Expected Discharge Plan: Home/Self Care Barriers to Discharge: Continued Medical Work up   Patient Goals and CMS Choice Patient states their goals for this hospitalization and ongoing recovery are:: to go homke CMS Medicare.gov Compare Post Acute Care list provided to:: Patient    Expected Discharge Plan and Services Expected Discharge Plan: Home/Self Care   Discharge Planning Services: CM Consult   Living arrangements for the past 2 months: Single Family Home                                      Prior Living Arrangements/Services Living arrangements for the past 2 months: Single Family Home Lives with:: Spouse Patient language and need for interpreter reviewed:: Yes Do you feel safe going back to the place where you live?: Yes            Criminal Activity/Legal Involvement Pertinent to Current Situation/Hospitalization: No - Comment as needed  Activities of Daily Living Home Assistive Devices/Equipment: Cane (specify quad or straight) ADL Screening (condition at time of admission) Patient's cognitive ability adequate to safely complete daily activities?: Yes Is the patient deaf or have difficulty  hearing?: No Does the patient have difficulty seeing, even when wearing glasses/contacts?: Yes Does the patient have difficulty concentrating, remembering, or making decisions?: No Patient able to express need for assistance with ADLs?: No Does the patient have difficulty dressing or bathing?: No Independently performs ADLs?: No Communication: Independent with device (comment) Dressing (OT): Independent Grooming: Independent Feeding: Independent Bathing: Independent Toileting: Independent In/Out Bed: Independent Walks in Home: Independent with device (comment) Does the patient have difficulty walking or climbing stairs?: Yes Weakness of Legs: None Weakness of Arms/Hands: None  Permission Sought/Granted                  Emotional Assessment Appearance:: Appears stated age     Orientation: : Oriented to Place, Oriented to Self, Oriented to  Time, Oriented to Situation Alcohol / Substance Use: Not Applicable Psych Involvement: No (comment)  Admission diagnosis:  Cystitis [N30.90] Sepsis ([place]) [A41.9] Severe sepsis ([place]) [A41.9, R65.20] Patient Active Problem List   Diagnosis Date Noted   Elevated blood pressure reading 10/19/2021   Confusion 10/19/2021   Hydronephrosis with infection 10/18/2021   Bacteremia due to Escherichia coli 10/18/2021   Severe sepsis ([place]) 10/17/2021   UTI (urinary tract infection) 10/17/2021   Dehydration 10/17/2021   Parkinson disease ([place]) 10/17/2021   Sepsis ([place]) 10/17/2021   Cardiomegaly 10/17/2021   Abdominal pain 10/17/2021   Tachycardia 10/17/2021   Cervical stenosis (uterine cervix) 02/14/2018   Postmenopausal  bleeding 02/14/2018   LBBB (left bundle branch block) 12/25/2015   PVCs (premature ventricular contractions) 12/25/2015   Hyperlipidemia 12/25/2015   Fibromyalgia 01/16/2014   Cholelithiasis 04/25/2013   Paralysis agitans ([place]) 01/15/2013   Unspecified hereditary and idiopathic peripheral neuropathy 01/15/2013   PCP:   [name], MD Pharmacy:   [id number] - [place], [place]. AT [address] [place] [address] Phone: [phone] Fax: [phone]  [facility] [place] [place] [facility] [name] [place] [address] Phone: [phone] Fax: [phone]     Social Determinants of Health (SDOH) Interventions    Readmission Risk Interventions No flowsheet data found.

## 2021-10-19 NOTE — Plan of Care (Signed)
°  Problem: Education: Goal: Knowledge of General Education information will improve Description: Including pain rating scale, medication(s)/side effects and non-pharmacologic comfort measures Outcome: Progressing   Problem: Activity: Goal: Risk for activity intolerance will decrease 10/19/2021 0349 by Baker Pierini, RN Outcome: Progressing 10/19/2021 0122 by Baker Pierini, RN Outcome: Progressing   Problem: Elimination: Goal: Will not experience complications related to bowel motility 10/19/2021 0349 by Baker Pierini, RN Outcome: Progressing 10/19/2021 0122 by Baker Pierini, RN Outcome: Progressing Goal: Will not experience complications related to urinary retention 10/19/2021 0349 by Baker Pierini, RN Outcome: Progressing 10/19/2021 0122 by Baker Pierini, RN Outcome: Progressing   Problem: Pain Managment: Goal: General experience of comfort will improve 10/19/2021 0349 by Baker Pierini, RN Outcome: Progressing 10/19/2021 0122 by Baker Pierini, RN Outcome: Progressing   Problem: Safety: Goal: Ability to remain free from injury will improve 10/19/2021 0349 by Baker Pierini, RN Outcome: Progressing 10/19/2021 0122 by Baker Pierini, RN Outcome: Progressing   Problem: Skin Integrity: Goal: Risk for impaired skin integrity will decrease 10/19/2021 0349 by Baker Pierini, RN Outcome: Progressing 10/19/2021 0122 by Baker Pierini, RN Outcome: Progressing   Problem: Respiratory: Goal: Ability to maintain adequate ventilation will improve 10/19/2021 0349 by Baker Pierini, RN Outcome: Progressing 10/19/2021 0122 by Baker Pierini, RN Outcome: Progressing

## 2021-10-19 NOTE — Evaluation (Signed)
Physical Therapy Evaluation Patient Details Name: Morgan [name] MRN: [id number] DOB: 1951/05/18 Today's Date: 10/19/2021  History of Present Illness  Patient is a 71 year old female who was admitted on 10/17/21 with sepsis, UTI, hydronephrosis. patient underwent emergent left uretal stent placement on 2/20. PMH: parkinsons, fibromyalgia, LBBB, hyperlipidemia.  Clinical Impression  Pt admitted with above diagnosis. At baseline, pt has support and is normally independent with ambulation and ADLs.  Today, pt was lethargic (up multiple times today, asleep when PT arrived) and required increased time and cues for transfers/motor planning.  She required min-mod A for transfers, improving with time up and awake.  She ambulated 20' x2 in room with assist.  Expect pt will progress well. Pt currently with functional limitations due to the deficits listed below (see PT Problem List). Pt will benefit from skilled PT to increase their independence and safety with mobility to allow discharge to the venue listed below.          Recommendations for follow up therapy are one component of a multi-disciplinary discharge planning process, led by the attending physician.  Recommendations may be updated based on patient status, additional functional criteria and insurance authorization.  Follow Up Recommendations Home health PT    Assistance Recommended at Discharge Frequent or constant Supervision/Assistance  Patient can return home with the following  A little help with walking and/or transfers;A little help with bathing/dressing/bathroom;Help with stairs or ramp for entrance    Equipment Recommendations None recommended by PT  Recommendations for Other Services       Functional Status Assessment Patient has had a recent decline in their functional status and demonstrates the ability to make significant improvements in function in a reasonable and predictable amount of time.     Precautions / Restrictions  Precautions Precautions: ICD/Pacemaker      Mobility  Bed Mobility Overal bed mobility: Needs Assistance Bed Mobility: Supine to Sit, Sit to Supine     Supine to sit: Mod assist, HOB elevated Sit to supine: Min assist   General bed mobility comments: Requiring cues for sequencing with assist for BLE and trunk movement - seemed to have difficulty coordinating movements    Transfers Overall transfer level: Needs assistance Equipment used: Rolling walker (2 wheels) (grab bar) Transfers: Sit to/from Stand Sit to Stand: Min assist, Mod assist           General transfer comment: Pt requiring cues to lean forward and stand.  Mod A from bed wtih tendency to posterior lean; min A from toilet twice with grab bar    Ambulation/Gait Ambulation/Gait assistance: Min assist Gait Distance (Feet): 20 Feet (20'x2) Assistive device: Rolling walker (2 wheels) Gait Pattern/deviations: Decreased stride length, Step-to pattern, Shuffle Gait velocity: decreased     General Gait Details: Initial min A to intiate and direct but progressed to min guard for safety  Stairs            Wheelchair Mobility    Modified Rankin (Stroke Patients Only)       Balance Overall balance assessment: Needs assistance Sitting-balance support: Bilateral upper extremity supported Sitting balance-Leahy Scale: Poor Sitting balance - Comments: Tendency to posterior lean requiring increased time and cues to scoot and lean forward Postural control: Posterior lean Standing balance support: Bilateral upper extremity supported Standing balance-Leahy Scale: Poor Standing balance comment: requiring min-mod A  Pertinent Vitals/Pain Pain Assessment Pain Assessment: No/denies pain    Home Living Family/patient expects to be discharged to:: Private residence Living Arrangements: Spouse/significant other Available Help at Discharge: Family;Available 24 hours/day Type  of Home: House Home Access: Stairs to enter Entrance Stairs-Rails: Psychiatric nurse of Steps: 3   Home Layout: Two level;Able to live on main level with bedroom/bathroom Home Equipment: Shower seat - built in;Grab bars - tub/shower;Cane - single point;Grab bars - toilet;Gaffer (4 wheels) Additional Comments: teak bench in shower    Prior Function Prior Level of Function : Independent/Modified Independent             Mobility Comments: Uses scooter for long distances; rollator in house ADLs Comments: Independent with ADLs; spouse assist with IADLs     Hand Dominance        Extremity/Trunk Assessment   Upper Extremity Assessment Upper Extremity Assessment: Overall WFL for tasks assessed    Lower Extremity Assessment Lower Extremity Assessment: Overall WFL for tasks assessed (not formally assessed as pt needing to get to restroom; at least 3/5 MMT throughout)    Cervical / Trunk Assessment Cervical / Trunk Assessment: Normal  Communication   Communication: No difficulties  Cognition Arousal/Alertness: Lethargic Behavior During Therapy: WFL for tasks assessed/performed, Flat affect Overall Cognitive Status: No family/caregiver present to determine baseline cognitive functioning                                 General Comments: Pt slow to respond at times        General Comments      Exercises     Assessment/Plan    PT Assessment Patient needs continued PT services  PT Problem List Decreased strength;Decreased mobility;Decreased safety awareness;Decreased balance;Decreased knowledge of use of DME;Decreased coordination;Decreased activity tolerance;Decreased knowledge of precautions;Decreased cognition       PT Treatment Interventions DME instruction;Therapeutic activities;Therapeutic exercise;Patient/family education;Gait training;Stair training;Balance training;Functional mobility training;Neuromuscular  re-education    PT Goals (Current goals can be found in the Care Plan section)  Acute Rehab PT Goals Patient Stated Goal: return home PT Goal Formulation: With patient Time For Goal Achievement: 11/02/21 Potential to Achieve Goals: Good    Frequency Min 3X/week     Co-evaluation PT/OT/SLP Co-Evaluation/Treatment: Yes Reason for Co-Treatment: For patient/therapist safety (near fall earlier, no tech) PT goals addressed during session: Mobility/safety with mobility OT goals addressed during session: ADL's and self-care       AM-PAC PT "6 Clicks" Mobility  Outcome Measure Help needed turning from your back to your side while in a flat bed without using bedrails?: A Little Help needed moving from lying on your back to sitting on the side of a flat bed without using bedrails?: A Lot Help needed moving to and from a bed to a chair (including a wheelchair)?: A Little Help needed standing up from a chair using your arms (e.g., wheelchair or bedside chair)?: A Lot Help needed to walk in hospital room?: A Little Help needed climbing 3-5 steps with a railing? : A Lot 6 Click Score: 15    End of Session Equipment Utilized During Treatment: Gait belt Activity Tolerance: Patient limited by fatigue (pt reports busy day) Patient left: in bed;with call bell/phone within reach;with bed alarm set Nurse Communication: Mobility status (found pill on floor, left on computer) PT Visit Diagnosis: Other abnormalities of gait and mobility (R26.89)    Time: 6962-9528 PT Time Calculation (min) (  ACUTE ONLY): 29 min   Charges:   PT Evaluation $PT Eval Low Complexity: 1 Low          Rosea Dory, PT Acute Rehab Services Pager [phone] [facility] [place] 10/19/2021, 4:37 PM

## 2021-10-19 NOTE — Assessment & Plan Note (Signed)
See problem, Severe sepsis

## 2021-10-19 NOTE — Assessment & Plan Note (Addendum)
Possibly related to acute illness. No focal neurologic deficits. Memory is mostly affected. Per husband, this issue was occurring before she received anesthesia. She does also have a history of Parkinson disease. -Watch for improvement

## 2021-10-19 NOTE — Assessment & Plan Note (Signed)
No prior history of hypertension. Possibly related to acute illness however she is improved clinically. -Amlodipine 5 mg daily

## 2021-10-19 NOTE — Evaluation (Signed)
Occupational Therapy Evaluation Patient Details Name: Morgan [name] MRN: [id number] DOB: 02/22/1951 Today's Date: 10/19/2021   History of Present Illness Patient is a 71 year old female who was admitted with sepsis, UTI, hydronephrosis. patient underwent emergent left uretal stent placement on 2/20. PMH: parkinsons, fibromyalgia, LBBB, hyperlipidemia.   Clinical Impression   Patient is a 71 year old female who was noted to have had a functional decline with patient admitted for above. Currently, patient is mod A for LB Dressing with decreased standing balance and decreased functional activity tolerance impacting participation in ADLs. Patient reported husband would be able to assist her at home and they would acquire caregivers if needed. Patient would continue to benefit from skilled OT services at this time while admitted and after d/c to address noted deficits in order to improve overall safety and independence in ADLs.       Recommendations for follow up therapy are one component of a multi-disciplinary discharge planning process, led by the attending physician.  Recommendations may be updated based on patient status, additional functional criteria and insurance authorization.   Follow Up Recommendations  Home health OT    Assistance Recommended at Discharge Frequent or constant Supervision/Assistance  Patient can return home with the following A little help with walking and/or transfers;A little help with bathing/dressing/bathroom;Assistance with cooking/housework;Direct supervision/assist for financial management;Assist for transportation;Direct supervision/assist for medications management;Help with stairs or ramp for entrance    Functional Status Assessment  Patient has had a recent decline in their functional status and demonstrates the ability to make significant improvements in function in a reasonable and predictable amount of time.  Equipment Recommendations  None recommended  by OT    Recommendations for Other Services       Precautions / Restrictions        Mobility Bed Mobility Overal bed mobility: Needs Assistance Bed Mobility: Supine to Sit, Sit to Supine     Supine to sit: Mod assist, HOB elevated Sit to supine: Min assist   General bed mobility comments: with increased time and physical assist for BLE and trunk movement and positioning    Transfers                          Balance Overall balance assessment: Mild deficits observed, not formally tested                                         ADL either performed or assessed with clinical judgement   ADL Overall ADL's : Needs assistance/impaired Eating/Feeding: Set up;Sitting   Grooming: Wash/dry face;Wash/dry hands;Sitting;Set up   Upper Body Bathing: Set up;Sitting   Lower Body Bathing: Minimal assistance;Sit to/from stand;Sitting/lateral leans Lower Body Bathing Details (indicate cue type and reason): patient unable to reach feet for bathing tasks after incontinence episodes. Upper Body Dressing : Set up;Sitting   Lower Body Dressing: Moderate assistance;Sit to/from stand;Sitting/lateral leans Lower Body Dressing Details (indicate cue type and reason): with increased time. patient noted to have increased difficutly with motor planning tasks on this date. Toilet Transfer: Minimal assistance;Rolling walker (2 wheels);+2 for safety/equipment Toilet Transfer Details (indicate cue type and reason): with increased time. patient noted to have incontinence episode when attempting to get shoes on to go to bathroom. Toileting- Clothing Manipulation and Hygiene: Minimal assistance;Sit to/from stand       Functional mobility during ADLs: Minimal  assistance       Vision Baseline Vision/History: 1 Wears glasses Patient Visual Report: No change from baseline Additional Comments: was noted to keep eyes closed during tasks with patietn reporting light from window  was irritating her. blindes were adjusted     Perception     Praxis      Pertinent Vitals/Pain Pain Assessment Pain Assessment: No/denies pain     Hand Dominance     Extremity/Trunk Assessment Upper Extremity Assessment Upper Extremity Assessment: Overall WFL for tasks assessed   Lower Extremity Assessment Lower Extremity Assessment: Defer to PT evaluation   Cervical / Trunk Assessment Cervical / Trunk Assessment: Normal   Communication Communication Communication: No difficulties   Cognition Arousal/Alertness: Awake/alert Behavior During Therapy: WFL for tasks assessed/performed, Flat affect Overall Cognitive Status: Within Functional Limits for tasks assessed                                       General Comments       Exercises     Shoulder Instructions      Home Living Family/patient expects to be discharged to:: Private residence Living Arrangements: Spouse/significant other Available Help at Discharge: Family;Available 24 hours/day Type of Home: House Home Access: Stairs to enter [employer] of Steps: 3 Entrance Stairs-Rails: Right;Left Home Layout: Two level;Able to live on main level with bedroom/bathroom     Bathroom Shower/Tub: Walk-in shower     Bathroom Accessibility: Yes   Home Equipment: Shower seat - built in;Grab bars - tub/shower;Cane - single point;Grab bars - toilet;Gaffer (4 wheels)   Additional Comments: teak bench in shower      Prior Functioning/Environment Prior Level of Function : Independent/Modified Independent             Mobility Comments: Uses scooter for long distances; rollator in house ADLs Comments: Independent with ADLs; spouse assist with IADLs        OT Problem List: Decreased activity tolerance;Impaired balance (sitting and/or standing);Decreased safety awareness;Decreased knowledge of precautions;Decreased knowledge of use of DME or AE      OT  Treatment/Interventions: Self-care/ADL training;Therapeutic exercise;Neuromuscular education;Energy conservation;DME and/or AE instruction;Therapeutic activities;Balance training;Patient/family education    OT Goals(Current goals can be found in the care plan section) Acute Rehab OT Goals Patient Stated Goal: to go home with husband OT Goal Formulation: With patient Time For Goal Achievement: 11/02/21 Potential to Achieve Goals: Good  OT Frequency: Min 2X/week    Co-evaluation PT/OT/SLP Co-Evaluation/Treatment: Yes Reason for Co-Treatment: To address functional/ADL transfers PT goals addressed during session: Mobility/safety with mobility OT goals addressed during session: ADL's and self-care      AM-PAC OT "6 Clicks" Daily Activity     Outcome Measure Help from another person eating meals?: None Help from another person taking care of personal grooming?: A Little Help from another person toileting, which includes using toliet, bedpan, or urinal?: A Little Help from another person bathing (including washing, rinsing, drying)?: A Little Help from another person to put on and taking off regular upper body clothing?: A Little Help from another person to put on and taking off regular lower body clothing?: A Lot 6 Click Score: 18   End of Session Equipment Utilized During Treatment: Gait belt;Rolling walker (2 wheels) Nurse Communication: Mobility status  Activity Tolerance: Patient tolerated treatment well Patient left: in bed;with call bell/phone within reach  OT Visit Diagnosis: Unsteadiness on feet (R26.81);Other abnormalities  of gait and mobility (R26.89);Muscle weakness (generalized) (M62.81)                Time: 7615-1834 OT Time Calculation (min): 29 min Charges:  OT General Charges $OT Visit: 1 Visit OT Evaluation $OT Eval Moderate Complexity: 1 Mod  Jackelyn Poling OTR/L, MS Acute Rehabilitation Department Office# [phone] Pager# [phone]   Marcellina Millin 10/19/2021, 4:02 PM

## 2021-10-19 NOTE — Progress Notes (Signed)
PROGRESS NOTE    Morgan [name]  YJE:[id number] DOB: 1951-02-21 DOA: 10/17/2021 PCP: [name], MD   Brief Narrative: Morgan [name] is a 71 y.o. female with a history of parkinson disease, fibromyalgia, LBBB, hyperlipidemia. Patient presented secondary to confusion and fever and found to have evidence of sepsis secondary to UTI with further evidence of an obstructing left ureteral stone. Patient started empirically on Ceftriaxone and Urology consulted. Patient underwent emergent left ureteral stent placement on 2/20. Blood and urine cultures suggest E. Coli bacterial infections.   Assessment and Plan: Bacteremia due to Escherichia coli- (present on admission) See problem, Severe sepsis  Hydronephrosis with infection- (present on admission) Secondary to obstructing stone. Patient underwent emergent and successful left ureteral stent placement on 2/20. Foley catheter placed on 2/20. -Urology recommendations: pending today  UTI (urinary tract infection)- (present on admission) See problem, Severe sepsis  Severe sepsis ([place])- (present on admission) Secondary to E. Coli UTI in setting of nephrolithiasis with obstruction and E. Coli bacteremia. Patient started empirically on Ceftriaxone. Urine and blood cultures suggest E. Coli infection. Tmax of 100.4 F this morning. -Continue Ceftriaxone IV -Follow-up urine/blood culture sensitivities  Hypokalemia-resolved as of 10/19/2021, (present on admission) Potassium of 3.3 on BMP on 2/20. Potassium of 4.1 on BMP today. Resolved with supplementation.  Tachycardia- (present on admission) Secondary to sepsis.  Parkinson disease ([place])- (present on admission) -Continue Sinemet and Cymbalta  Hyperlipidemia- (present on admission) -Continue Lipitor  LBBB (left bundle branch block)- (present on admission) Chronic. Normal troponin on admission.  Confusion Possibly related to acute illness. No focal neurologic deficits. Memory is mostly  affected. Per husband, this issue was occurring before she received anesthesia. She does also have a history of Parkinson disease. -Watch for improvement  Elevated blood pressure reading No prior history of hypertension. Possibly related to acute illness however she is improved clinically. -Amlodipine 5 mg daily  Acute metabolic encephalopathy-resolved as of 10/18/2021, (present on admission) Resolved with treatment of infection, IV fluids.    DVT prophylaxis: SCDs Code Status:   Code Status: Full Code Family Communication: None at bedside Disposition Plan: Discharge home likely in 1 day pending culture data, transition to oral antibiotics, urology recommendations and improvement of confusion   Consultants:  Urology  Procedures:  CYSTOSCOPY, [place] (10/18/2021)  Antimicrobials: Ceftriaxone IV    Subjective: Patient reports no issues this morning. Per husband, he has noticed continued confusion/memory difficulties.  Objective: BP (!) 169/94 Comment: notified on call provider about patient's BP   Pulse (!) 103    Temp 98.5 F (36.9 C) (Oral)    Resp 16    Ht 5\' 6"  (1.676 m)    Wt 79.4 kg    SpO2 97%    BMI 28.25 kg/m   Examination:  General exam: Appears calm and comfortable Respiratory system: Clear to auscultation. Respiratory effort normal. Cardiovascular system: S1 & S2 heard, RRR. No murmurs, rubs, gallops or clicks. Gastrointestinal system: Abdomen is distended, soft and nontender. No organomegaly or masses felt. Normal bowel sounds heard. Central nervous system: Alert and oriented. No focal neurological deficits. Musculoskeletal: No edema. No calf tenderness Skin: No cyanosis. No rashes Psychiatry: Short term memory is impaired.   Data Reviewed: I have personally reviewed following labs and imaging studies  CBC Lab Results  Component Value Date   WBC 11.6 (H) 10/19/2021   RBC 3.95 10/19/2021   HGB 11.3 (L) 10/19/2021   HCT 35.6 (L)  10/19/2021   MCV 90.1 10/19/2021  MCH 28.6 10/19/2021   PLT 203 10/19/2021   MCHC 31.7 10/19/2021   RDW 13.6 10/19/2021   LYMPHSABS 1.2 10/18/2021   MONOABS 0.8 10/18/2021   EOSABS 0.0 10/18/2021   BASOSABS 0.0 78/29/5621     Last metabolic panel Lab Results  Component Value Date   NA 135 10/18/2021   K 4.1 10/19/2021   CL 104 10/18/2021   CO2 24 10/18/2021   BUN 11 10/18/2021   CREATININE 0.67 10/18/2021   GLUCOSE 107 (H) 10/18/2021   GFRNONAA >60 10/18/2021   GFRAA >60 02/05/2018   CALCIUM 8.1 (L) 10/18/2021   PHOS 3.0 10/18/2021   PROT 6.4 (L) 10/18/2021   ALBUMIN 3.1 (L) 10/18/2021   BILITOT 0.8 10/18/2021   ALKPHOS 85 10/18/2021   AST 13 (L) 10/18/2021   ALT 7 10/18/2021   ANIONGAP 7 10/18/2021    GFR: Estimated Creatinine Clearance: 66.5 mL/min (by C-G formula based on SCr of 0.67 mg/dL).  Recent Results (from the past 240 hour(s))  Resp Panel by RT-PCR (Flu A&B, Covid) Peripheral     Status: None   Collection Time: 10/17/21  3:04 PM   Specimen: Peripheral; Nasopharyngeal(NP) swabs in vial transport medium  Result Value Ref Range Status   SARS Coronavirus 2 by RT PCR NEGATIVE NEGATIVE Final    Comment: (NOTE) SARS-CoV-2 target nucleic acids are NOT DETECTED.  The SARS-CoV-2 RNA is generally detectable in upper respiratory specimens during the acute phase of infection. The lowest concentration of SARS-CoV-2 viral copies this assay can detect is 138 copies/mL. A negative result does not preclude SARS-Cov-2 infection and should not be used as the sole basis for treatment or other patient management decisions. A negative result may occur with  improper specimen collection/handling, submission of specimen other than nasopharyngeal swab, presence of viral mutation(s) within the areas targeted by this assay, and inadequate number of viral copies(<138 copies/mL). A negative result must be combined with clinical observations, patient history, and  epidemiological information. The expected result is Negative.  Fact Sheet for Patients:  EntrepreneurPulse.com.au  Fact Sheet for Healthcare Providers:  IncredibleEmployment.be  This test is no t yet approved or cleared by the [place] FDA and  has been authorized for detection and/or diagnosis of SARS-CoV-2 by FDA under an Emergency Use Authorization (EUA). This EUA will remain  in effect (meaning this test can be used) for the duration of the COVID-19 declaration under Section 564(b)(1) of the Act, 21 U.S.C.section 360bbb-3(b)(1), unless the authorization is terminated  or revoked sooner.       Influenza A by PCR NEGATIVE NEGATIVE Final   Influenza B by PCR NEGATIVE NEGATIVE Final    Comment: (NOTE) The Xpert Xpress SARS-CoV-2/FLU/RSV plus assay is intended as an aid in the diagnosis of influenza from Nasopharyngeal swab specimens and should not be used as a sole basis for treatment. Nasal washings and aspirates are unacceptable for Xpert Xpress SARS-CoV-2/FLU/RSV testing.  Fact Sheet for Patients: EntrepreneurPulse.com.au  Fact Sheet for Healthcare Providers: IncredibleEmployment.be  This test is not yet approved or cleared by the [place] FDA and has been authorized for detection and/or diagnosis of SARS-CoV-2 by FDA under an Emergency Use Authorization (EUA). This EUA will remain in effect (meaning this test can be used) for the duration of the COVID-19 declaration under Section 564(b)(1) of the Act, 21 U.S.C. section 360bbb-3(b)(1), unless the authorization is terminated or revoked.  Performed at [facility], [place] [address] [place], [place] [address]   Blood Culture (routine  x 2)     Status: None (Preliminary result)   Collection Time: 10/17/21  3:04 PM   Specimen: BLOOD  Result Value Ref Range Status   Specimen Description   Final    BLOOD RIGHT  ANTECUBITAL Performed at [place] [address], [place], [place] [address]    Special Requests   Final    BOTTLES DRAWN AEROBIC AND ANAEROBIC Blood Culture adequate volume Performed at [place] [address], [place], [place] [address]    Culture   Final    NO GROWTH 2 DAYS Performed at [facility] [place] [address], [place], [place] [address]    Report Status PENDING  Incomplete  Urine Culture     Status: Abnormal   Collection Time: 10/17/21  3:04 PM   Specimen: In/Out Cath Urine  Result Value Ref Range Status   Specimen Description   Final    IN/OUT CATH URINE Performed at [place] [address] [place], [place] [address]    Special Requests   Final    NONE Performed at [facility], [place] [address] [place], [place] [address]    Culture >=100,000 COLONIES/mL ESCHERICHIA COLI (A)  Final   Report Status 10/19/2021 FINAL  Final   Organism ID, Bacteria ESCHERICHIA COLI (A)  Final      Susceptibility   Escherichia coli - MIC*    AMPICILLIN 8 SENSITIVE Sensitive     CEFAZOLIN <=4 SENSITIVE Sensitive     CEFEPIME <=0.12 SENSITIVE Sensitive     CEFTRIAXONE <=0.25 SENSITIVE Sensitive     CIPROFLOXACIN <=0.25 SENSITIVE Sensitive     GENTAMICIN <=1 SENSITIVE Sensitive     IMIPENEM <=0.25 SENSITIVE Sensitive     NITROFURANTOIN <=16 SENSITIVE Sensitive     TRIMETH/SULFA <=20 SENSITIVE Sensitive     AMPICILLIN/SULBACTAM <=2 SENSITIVE Sensitive     PIP/TAZO <=4 SENSITIVE Sensitive     * >=100,000 COLONIES/mL ESCHERICHIA COLI  Blood Culture (routine x 2)     Status: None (Preliminary result)   Collection Time: 10/17/21  3:06 PM   Specimen: BLOOD  Result Value Ref Range Status   Specimen Description   Final    BLOOD LEFT ANTECUBITAL Performed at [place] [address] [place], [place] [address]    Special Requests   Final    BOTTLES DRAWN AEROBIC AND  ANAEROBIC Blood Culture adequate volume Performed at [facility] [address], [place], [place] [address]    Culture  Setup Time   Final    GRAM NEGATIVE RODS AEROBIC BOTTLE ONLY CRITICAL RESULT CALLED TO, READ BACK BY AND VERIFIED WITH: PHARM D J.LEGGE ON [id number] AT 1035 BY E.PARRISH Performed at [facility], [place] [address], [place],  [address]    Culture GRAM NEGATIVE RODS  Final   Report Status PENDING  Incomplete  Blood Culture ID Panel (Reflexed)     Status: Abnormal   Collection Time: 10/17/21  3:06 PM  Result Value Ref Range Status   Enterococcus faecalis NOT DETECTED NOT DETECTED Final   Enterococcus Faecium NOT DETECTED NOT DETECTED Final   Listeria monocytogenes NOT DETECTED NOT DETECTED Final   Staphylococcus species NOT DETECTED NOT DETECTED Final   Staphylococcus aureus (BCID) NOT DETECTED NOT DETECTED Final   Staphylococcus epidermidis NOT DETECTED NOT DETECTED Final   Staphylococcus lugdunensis NOT DETECTED NOT DETECTED Final   Streptococcus species NOT DETECTED NOT DETECTED Final  Streptococcus agalactiae NOT DETECTED NOT DETECTED Final   Streptococcus pneumoniae NOT DETECTED NOT DETECTED Final   Streptococcus pyogenes NOT DETECTED NOT DETECTED Final   A.calcoaceticus-baumannii NOT DETECTED NOT DETECTED Final   Bacteroides fragilis NOT DETECTED NOT DETECTED Final   Enterobacterales DETECTED (A) NOT DETECTED Final    Comment: Enterobacterales represent a large order of gram negative bacteria, not a single organism. CRITICAL RESULT CALLED TO, READ BACK BY AND VERIFIED WITH: PHARM D J.LEGGE ON [id number] AT 4010 BY E.PARRISH    Enterobacter cloacae complex NOT DETECTED NOT DETECTED Final   Escherichia coli DETECTED (A) NOT DETECTED Final    Comment: CRITICAL RESULT CALLED TO, READ BACK BY AND VERIFIED WITH: PHARM D J.[place] ON [id number] AT 4034 BY E.PARRISH    Klebsiella aerogenes NOT DETECTED NOT DETECTED Final   Klebsiella  oxytoca NOT DETECTED NOT DETECTED Final   Klebsiella pneumoniae NOT DETECTED NOT DETECTED Final   Proteus species NOT DETECTED NOT DETECTED Final   Salmonella species NOT DETECTED NOT DETECTED Final   Serratia marcescens NOT DETECTED NOT DETECTED Final   Haemophilus influenzae NOT DETECTED NOT DETECTED Final   Neisseria meningitidis NOT DETECTED NOT DETECTED Final   Pseudomonas aeruginosa NOT DETECTED NOT DETECTED Final   Stenotrophomonas maltophilia NOT DETECTED NOT DETECTED Final   Candida albicans NOT DETECTED NOT DETECTED Final   Candida auris NOT DETECTED NOT DETECTED Final   Candida glabrata NOT DETECTED NOT DETECTED Final   Candida krusei NOT DETECTED NOT DETECTED Final   Candida parapsilosis NOT DETECTED NOT DETECTED Final   Candida tropicalis NOT DETECTED NOT DETECTED Final   Cryptococcus neoformans/gattii NOT DETECTED NOT DETECTED Final   CTX-M ESBL NOT DETECTED NOT DETECTED Final   Carbapenem resistance IMP NOT DETECTED NOT DETECTED Final   Carbapenem resistance KPC NOT DETECTED NOT DETECTED Final   Carbapenem resistance NDM NOT DETECTED NOT DETECTED Final   Carbapenem resist OXA 48 LIKE NOT DETECTED NOT DETECTED Final   Carbapenem resistance VIM NOT DETECTED NOT DETECTED Final    Comment: Performed at [facility], [place] [address] [place], [place] [address]      Radiology Studies: CT Angio Chest Pulmonary Embolism (PE) W or WO Contrast  Result Date: 10/17/2021 CLINICAL DATA:  PE suspected.  Evaluate for sepsis. EXAM: CT CHEST, ABDOMEN, AND PELVIS WITH CONTRAST TECHNIQUE: Multidetector CT imaging of the chest, abdomen and pelvis was performed following the standard protocol during bolus administration of intravenous contrast. RADIATION DOSE REDUCTION: This exam was performed according to the departmental dose-optimization program which includes automated exposure control, adjustment of the mA and/or kV according to patient size and/or use of iterative reconstruction  technique. CONTRAST:  132mL OMNIPAQUE IOHEXOL 350 MG/ML  SOLN COMPARISON:  CT chest 09/03/2008 FINDINGS: CT CHEST FINDINGS Cardiovascular: Satisfactory opacification to the level of the segmental pulmonary arteries. No pulmonary embolism. Normal heart size. No pericardial effusion. Aortic calcifications. Mediastinum/Nodes: No enlarged mediastinal, hilar, or axillary lymph nodes. Thyroid gland, trachea, and esophagus demonstrate no significant findings. Lungs/Pleura: Lungs are clear. Minimal dependent subsegmental atelectasis. No pleural effusion or pneumothorax. Musculoskeletal: No chest wall mass or suspicious bone lesions identified. CT ABDOMEN PELVIS FINDINGS Hepatobiliary: No focal liver abnormality is seen. Several gallstones measuring up to 1.7 cm. No gallbladder wall thickening or pericholecystic fluid. The extrahepatic bile duct is dilated measuring 1.0 cm in diameter (series 2, image 29; series 5, image 41). No discrete stone identified within the extrahepatic bile duct. No intrahepatic bile duct dilatation. Pancreas: Unremarkable. No pancreatic  ductal dilatation or surrounding inflammatory changes. Spleen: Normal in size without focal abnormality. Adrenals/Urinary Tract: Adrenal glands are unremarkable. Mild left hydroureteronephrosis secondary to a 0.2 cm stone in the distal third of the left ureter (series 2, image 73). Normal right kidney. Normal appearance of the mildly distended urinary bladder. Stomach/Bowel: Stomach is within normal limits. The appendix is not directly visualized, however no pericecal inflammatory changes. Diverticulosis of the sigmoid colon. No evidence of bowel wall thickening, distention, or inflammatory changes. Vascular/Lymphatic: Aortic atherosclerosis. No enlarged abdominal or pelvic lymph nodes. Reproductive: Uterus and bilateral adnexa are unremarkable. Other: No abdominal wall hernia or abnormality. No abdominopelvic ascites. Musculoskeletal: Rightward curvature of the  lumbar spine centered at the L1-L2 level. Multilevel degenerative disc disease in the lumbar spine, most severe at L1-L2 and L2-L3. No acute or suspicious osseous findings. IMPRESSION: CTA chest: 1. No pulmonary embolism. 2. Lungs are clear. 3.  Aortic Atherosclerosis (ICD10-I70.0). CT abdomen/pelvis: 1. Mild left hydroureteronephrosis secondary to a 0.2 cm stone in the distal third of the left ureter. 2. Cholelithiasis without findings of cholecystitis. 3. Sigmoid diverticulosis without findings of diverticulitis. Electronically Signed   By: Ileana Roup M.D.   On: 10/17/2021 21:34   CT ABDOMEN PELVIS W CONTRAST  Result Date: 10/17/2021 CLINICAL DATA:  PE suspected.  Evaluate for sepsis. EXAM: CT CHEST, ABDOMEN, AND PELVIS WITH CONTRAST TECHNIQUE: Multidetector CT imaging of the chest, abdomen and pelvis was performed following the standard protocol during bolus administration of intravenous contrast. RADIATION DOSE REDUCTION: This exam was performed according to the departmental dose-optimization program which includes automated exposure control, adjustment of the mA and/or kV according to patient size and/or use of iterative reconstruction technique. CONTRAST:  157mL OMNIPAQUE IOHEXOL 350 MG/ML  SOLN COMPARISON:  CT chest 09/03/2008 FINDINGS: CT CHEST FINDINGS Cardiovascular: Satisfactory opacification to the level of the segmental pulmonary arteries. No pulmonary embolism. Normal heart size. No pericardial effusion. Aortic calcifications. Mediastinum/Nodes: No enlarged mediastinal, hilar, or axillary lymph nodes. Thyroid gland, trachea, and esophagus demonstrate no significant findings. Lungs/Pleura: Lungs are clear. Minimal dependent subsegmental atelectasis. No pleural effusion or pneumothorax. Musculoskeletal: No chest wall mass or suspicious bone lesions identified. CT ABDOMEN PELVIS FINDINGS Hepatobiliary: No focal liver abnormality is seen. Several gallstones measuring up to 1.7 cm. No gallbladder wall  thickening or pericholecystic fluid. The extrahepatic bile duct is dilated measuring 1.0 cm in diameter (series 2, image 29; series 5, image 41). No discrete stone identified within the extrahepatic bile duct. No intrahepatic bile duct dilatation. Pancreas: Unremarkable. No pancreatic ductal dilatation or surrounding inflammatory changes. Spleen: Normal in size without focal abnormality. Adrenals/Urinary Tract: Adrenal glands are unremarkable. Mild left hydroureteronephrosis secondary to a 0.2 cm stone in the distal third of the left ureter (series 2, image 73). Normal right kidney. Normal appearance of the mildly distended urinary bladder. Stomach/Bowel: Stomach is within normal limits. The appendix is not directly visualized, however no pericecal inflammatory changes. Diverticulosis of the sigmoid colon. No evidence of bowel wall thickening, distention, or inflammatory changes. Vascular/Lymphatic: Aortic atherosclerosis. No enlarged abdominal or pelvic lymph nodes. Reproductive: Uterus and bilateral adnexa are unremarkable. Other: No abdominal wall hernia or abnormality. No abdominopelvic ascites. Musculoskeletal: Rightward curvature of the lumbar spine centered at the L1-L2 level. Multilevel degenerative disc disease in the lumbar spine, most severe at L1-L2 and L2-L3. No acute or suspicious osseous findings. IMPRESSION: CTA chest: 1. No pulmonary embolism. 2. Lungs are clear. 3.  Aortic Atherosclerosis (ICD10-I70.0). CT abdomen/pelvis: 1. Mild left hydroureteronephrosis secondary to  a 0.2 cm stone in the distal third of the left ureter. 2. Cholelithiasis without findings of cholecystitis. 3. Sigmoid diverticulosis without findings of diverticulitis. Electronically Signed   By: Ileana Roup M.D.   On: 10/17/2021 21:34   DG Chest Port 1 View  Result Date: 10/17/2021 CLINICAL DATA:  Questionable sepsis, foul-smelling urine, history of UTI EXAM: PORTABLE CHEST 1 VIEW COMPARISON:  12/21/2015 FINDINGS:  Cardiomegaly. Both lungs are clear. The visualized skeletal structures are unremarkable. IMPRESSION: Cardiomegaly without acute abnormality of the lungs in AP portable projection. Electronically Signed   By: Delanna Ahmadi M.D.   On: 10/17/2021 15:13   DG C-Arm 1-60 Min-No Report  Result Date: 10/18/2021 Fluoroscopy was utilized by the requesting physician.  No radiographic interpretation.   ECHOCARDIOGRAM COMPLETE  Result Date: 10/18/2021    ECHOCARDIOGRAM REPORT   Patient Name:   KRISHAWNA STIEFEL Date of Exam: 10/18/2021 Medical Rec #:  [id number]       Height:       66.0 in Accession #:    [id number]      Weight:       175.0 lb Date of Birth:  [birth year]       BSA:          1.890 m Patient Age:    69 years        BP:           130/67 mmHg Patient Gender: F               HR:           95 bpm. Exam Location:  Inpatient Procedure: 2D Echo Indications:    Cardiomegaly  History:        Patient has prior history of Echocardiogram examinations, most                 recent 01/11/2016. Arrythmias:Tachycardia and LBBB.  Sonographer:    Jefferey Pica Referring Phys: [place]  1. Left ventricular ejection fraction, by estimation, is 60 to 65%. The left ventricle has normal function. The left ventricle has no regional wall motion abnormalities. Left ventricular diastolic parameters are indeterminate. Elevated left ventricular end-diastolic pressure.  2. Right ventricular systolic function is normal. The right ventricular size is normal.  3. The mitral valve is grossly normal. Mild to moderate mitral valve regurgitation.  4. The aortic valve is normal in structure. Aortic valve regurgitation is not visualized. No aortic stenosis is present. FINDINGS  Left Ventricle: Left ventricular ejection fraction, by estimation, is 60 to 65%. The left ventricle has normal function. The left ventricle has no regional wall motion abnormalities. The left ventricular internal cavity size was normal in size.  There is  no left ventricular hypertrophy. Left ventricular diastolic parameters are indeterminate. Elevated left ventricular end-diastolic pressure. Right Ventricle: The right ventricular size is normal. Right vetricular wall thickness was not well visualized. Right ventricular systolic function is normal. Left Atrium: Left atrial size was normal in size. Right Atrium: Right atrial size was normal in size. Pericardium: There is no evidence of pericardial effusion. Mitral Valve: The mitral valve is grossly normal. Mild to moderate mitral valve regurgitation. Tricuspid Valve: The tricuspid valve is grossly normal. Tricuspid valve regurgitation is trivial. Aortic Valve: The aortic valve is normal in structure. Aortic valve regurgitation is not visualized. No aortic stenosis is present. Aortic valve peak gradient measures 8.1 mmHg. Pulmonic Valve: The pulmonic valve was normal in structure. Pulmonic valve regurgitation is not visualized. Aorta: The  aortic root and ascending aorta are structurally normal, with no evidence of dilitation. IAS/Shunts: The atrial septum is grossly normal.  LEFT VENTRICLE PLAX 2D LVIDd:         4.00 cm   Diastology LVIDs:         2.80 cm   LV e' medial:    5.11 cm/s LV PW:         1.40 cm   LV E/e' medial:  23.5 LV IVS:        1.60 cm   LV e' lateral:   4.79 cm/s LVOT diam:     2.00 cm   LV E/e' lateral: 25.1 LV SV:         71 LV SV Index:   37 LVOT Area:     3.14 cm  RIGHT VENTRICLE             IVC RV Basal diam:  2.50 cm     IVC diam: 2.40 cm RV S prime:     14.50 cm/s TAPSE (M-mode): 2.2 cm LEFT ATRIUM             Index        RIGHT ATRIUM           Index LA diam:        4.10 cm 2.17 cm/m   RA Area:     11.80 cm LA Vol (A2C):   50.9 ml 26.94 ml/m  RA Volume:   21.80 ml  11.54 ml/m LA Vol (A4C):   62.3 ml 32.97 ml/m LA Biplane Vol: 59.7 ml 31.59 ml/m  AORTIC VALVE                 PULMONIC VALVE AV Area (Vmax): 2.62 cm     PV Vmax:       1.07 m/s AV Vmax:        142.50 cm/s  PV Peak  grad:  4.6 mmHg AV Peak Grad:   8.1 mmHg LVOT Vmax:      119.00 cm/s LVOT Vmean:     79.200 cm/s LVOT VTI:       0.225 m  AORTA Ao Root diam: 3.40 cm Ao Asc diam:  3.80 cm MITRAL VALVE MV Area (PHT): 5.34 cm     SHUNTS MV Decel Time: 142 msec     Systemic VTI:  0.22 m MR Peak grad: 102.4 mmHg    Systemic Diam: 2.00 cm MR Vmax:      506.00 cm/s MV E velocity: 120.00 cm/s Morgan Moores MD Electronically signed by Morgan Moores MD Signature Date/Time: 10/18/2021/3:42:52 PM    Final       LOS: 1 day    Morgan Poche, MD [facility] 10/19/2021, 9:25 AM   If 7PM-7AM, please contact night-coverage [email or web address]

## 2021-10-19 NOTE — Progress Notes (Signed)
Notified on call provider about patient's elevated Bps: 188/103 taken at 2331, 182/106 taken at 0007, 179/101 at 0118, 176/100 at 0130, and 169/94 at 0325. No new orders at this time. Will continue to monitor.

## 2021-10-20 DIAGNOSIS — I10 Essential (primary) hypertension: Secondary | ICD-10-CM

## 2021-10-20 DIAGNOSIS — R5381 Other malaise: Secondary | ICD-10-CM

## 2021-10-20 DIAGNOSIS — R7881 Bacteremia: Secondary | ICD-10-CM | POA: Diagnosis not present

## 2021-10-20 DIAGNOSIS — G9341 Metabolic encephalopathy: Secondary | ICD-10-CM | POA: Diagnosis not present

## 2021-10-20 DIAGNOSIS — N136 Pyonephrosis: Secondary | ICD-10-CM | POA: Diagnosis not present

## 2021-10-20 DIAGNOSIS — I447 Left bundle-branch block, unspecified: Secondary | ICD-10-CM | POA: Diagnosis not present

## 2021-10-20 LAB — CULTURE, BLOOD (ROUTINE X 2): Special Requests: ADEQUATE

## 2021-10-20 LAB — CBC
HCT: 34.4 % — ABNORMAL LOW (ref 36.0–46.0)
Hemoglobin: 10.9 g/dL — ABNORMAL LOW (ref 12.0–15.0)
MCH: 28.4 pg (ref 26.0–34.0)
MCHC: 31.7 g/dL (ref 30.0–36.0)
MCV: 89.6 fL (ref 80.0–100.0)
Platelets: 243 10*3/uL (ref 150–400)
RBC: 3.84 MIL/uL — ABNORMAL LOW (ref 3.87–5.11)
RDW: 13.7 % (ref 11.5–15.5)
WBC: 8.1 10*3/uL (ref 4.0–10.5)
nRBC: 0 % (ref 0.0–0.2)

## 2021-10-20 MED ORDER — AMLODIPINE BESYLATE 10 MG PO TABS: 10.0000 mg | ORAL_TABLET | Freq: Every day | ORAL | Status: DC

## 2021-10-20 MED ORDER — LISINOPRIL 10 MG PO TABS
10.0000 mg | ORAL_TABLET | Freq: Every day | ORAL | Status: DC
Start: 2021-10-20 — End: 2021-10-21
  Administered 2021-10-20 – 2021-10-21 (×2): 10 mg via ORAL
  Filled 2021-10-20 (×2): qty 1

## 2021-10-20 NOTE — Assessment & Plan Note (Signed)
-   PT/OT recommend home health PT/OT.

## 2021-10-20 NOTE — Progress Notes (Signed)
Progress Note   Patient: Morgan [name] WLN:[id number] DOB: 1950-12-09 DOA: 10/17/2021     2 DOS: the patient was seen and examined on 10/20/2021   Brief hospital course: 71 y.o. female with a history of parkinson's disease, fibromyalgia, LBBB, hyperlipidemia presented secondary to confusion and fever and was found to have evidence of sepsis secondary to UTI with further evidence of an obstructing left ureteral stone. She was started empirically on Ceftriaxone and Urology was consulted. Patient underwent emergent left ureteral stent placement on 10/18/2021.  She was found to have E. coli bacteremia. Assessment and Plan: Severe sepsis ([place])- (present on admission) E. Coli UTI bacteremia Left ureterolithiasis with obstruction with hydronephrosis/acute pyelonephritis -underwent emergent and successful left ureteral stent placement on 10/18/2021.  Foley catheter removed on 10/19/2021.  Outpatient follow-up with urology. -Currently on Rocephin.  Blood and urine culture growing E. coli.  We will continue IV Rocephin for 1 more day and switch to oral antibiotics possibly tomorrow. -Sepsis has resolved.  Acute metabolic encephalopathy - Possibly from sepsis/infection. -Mental status improving: Husband states that her mental status is improving but not back to baseline yet. -Monitor mental status.  Fall precautions.  Parkinson disease ([place])- (present on admission) -Continue Sinemet and Cymbalta  LBBB (left bundle branch block)- (present on admission) -Chronic. Normal troponin on admission. -Outpatient follow-up with Dr. Croitoru/cardiology  HTN (hypertension) - Blood pressure elevated currently.  Does not take antihypertensives at home.  Currently on amlodipine 5 mg daily: After discussing with husband, will switch to lisinopril 10 mg daily.  Husband does not prefer amlodipine.  Hyperlipidemia- (present on admission) -Continue Lipitor  Physical deconditioning - PT/OT recommend home health  PT/OT.       Subjective:  Patient seen and examined at bedside.  Husband present at bedside as well.  Patient feels slightly better.  Husband thinks that her mental status is improving but not back to baseline yet.  No overnight fever, vomiting, worsening abdominal pain reported.  Physical Exam: Vitals:   10/19/21 1655 10/19/21 2029 10/19/21 2333 10/20/21 0546  BP: (!) 158/99 (!) 162/91 (!) 170/93 (!) 153/103  Pulse: (!) 105 97  99  Resp:  20  18  Temp:  97.8 F (36.6 C)  98.2 F (36.8 C)  TempSrc:  Oral  Oral  SpO2:  98%  93%  Weight:      Height:       General: No acute distress, currently on room air ENT/neck: No elevated JVD.  No obvious masses  respiratory: Bilateral decreased breath sounds at bases with some scattered crackles CVS: S1-S2 heard, rate controlled Abdominal: Soft, nontender, nondistended, no organomegaly, bowel sounds heard Extremities: No cyanosis, clubbing, edema CNS: Alert, awake and oriented.  Extremely slow to respond.  No focal neurologic deficit.  Moving extremities. Lymph: No cervical lymphadenopathy Skin: No rashes, lesions, ulcers Psych: Affect is mostly flat.  No signs of agitation.   Musculoskeletal: No obvious joint deformity/tenderness/swelling   Data Reviewed: I have reviewed patient's investigations including blood work and imaging myself during this hospitalization.  Today's WBC of 8.1, hemoglobin of 10.9.  Blood and urine cultures growing E. coli. Family Communication: Husband at bedside  Disposition: Status is: Inpatient Remains inpatient appropriate because: Of severity of illness.  Need for IV antibiotics.  Possible discharge home tomorrow if remains stable.     Planned Discharge Destination: Home with Home Health     Time spent: 50 minutes  Author: Aline August, MD 10/20/2021 9:42 AM  For on call review [email or web address].

## 2021-10-20 NOTE — TOC Transition Note (Signed)
Transition of Care [facility]) - CM/SW Discharge Note   Patient Details  Name: Meganne Rita MRN: [id number] Date of Birth: March 27, 1951  Transition of Care [facility]) CM/SW Contact:  Leeroy Cha, RN Phone Number: 10/20/2021, 1:44 PM   Clinical Narrative:    Pt and ot ordered through [place] per pt.s request.   Final next level of care: Home w Home Health Services Barriers to Discharge: Barriers Resolved   Patient Goals and CMS Choice Patient states their goals for this hospitalization and ongoing recovery are:: to go [facility] CMS Medicare.gov Compare Post Acute Care list provided to:: Patient Choice offered to / list presented to : Patient, Spouse  Discharge Placement                       Discharge Plan and Services   Discharge Planning Services: CM Consult Post Acute Care Choice: Home Health                    HH Arranged: PT, OT [facility] Agency: [place] Date [place]: 10/20/21 Time [place]: 9470 Representative spoke with at [place]: stacie  Social Determinants of Health ([place]) Interventions     Readmission Risk Interventions No flowsheet data found.

## 2021-10-20 NOTE — Progress Notes (Signed)
Urology Progress Note   2 Days Post-Op from left ureteral stent placement.   Subjective: NAEON.  Vitals are stable.  Urine and blood growing E. coli.  Objective: Vital signs in last 24 hours: Temp:  [97.8 F (36.6 C)-98.3 F (36.8 C)] 98.2 F (36.8 C) (02/22 0546) Pulse Rate:  [97-105] 99 (02/22 0546) Resp:  [16-20] 18 (02/22 0546) BP: (153-175)/(91-103) 153/103 (02/22 0546) SpO2:  [93 %-98 %] 93 % (02/22 0546)  Intake/Output from previous day: 02/21 0701 - 02/22 0700 In: 460 [P.O.:360; IV Piggyback:100] Out: -  Intake/Output this shift: No intake/output data recorded.  Physical Exam:  General: Alert and oriented CV: Regular rate Lungs: No increased work of breathing Abdomen: Soft nontender GU: Voiding spontaneously Ext: NT, No erythema  Lab Results: Recent Labs    10/18/21 0547 10/19/21 0347 10/20/21 0348  HGB 10.8* 11.3* 10.9*  HCT 34.4* 35.6* 34.4*   Recent Labs    10/17/21 1504 10/18/21 0547 10/19/21 0347  NA 135 135  --   K 3.2* 3.3* 4.1  CL 102 104  --   CO2 27 24  --   GLUCOSE 121* 107*  --   BUN 13 11  --   CREATININE 0.73 0.67  --   CALCIUM 8.7* 8.1*  --     Studies/Results: DG C-Arm 1-60 Min-No Report  Result Date: 10/18/2021 Fluoroscopy was utilized by the requesting physician.  No radiographic interpretation.   ECHOCARDIOGRAM COMPLETE  Result Date: 10/18/2021    ECHOCARDIOGRAM REPORT   Patient Name:   Morgan [name] Date of Exam: 10/18/2021 Medical Rec #:  [id number]       Height:       66.0 in Accession #:    [id number]      Weight:       175.0 lb Date of Birth:  1951-02-23       BSA:          1.890 m Patient Age:    16 years        BP:           130/67 mmHg Patient Gender: F               HR:           95 bpm. Exam Location:  Inpatient Procedure: 2D Echo Indications:    Cardiomegaly  History:        Patient has prior history of Echocardiogram examinations, most                 recent 01/11/2016. Arrythmias:Tachycardia and LBBB.  Sonographer:     Jefferey Pica Referring Phys: Miranda  1. Left ventricular ejection fraction, by estimation, is 60 to 65%. The left ventricle has normal function. The left ventricle has no regional wall motion abnormalities. Left ventricular diastolic parameters are indeterminate. Elevated left ventricular end-diastolic pressure.  2. Right ventricular systolic function is normal. The right ventricular size is normal.  3. The mitral valve is grossly normal. Mild to moderate mitral valve regurgitation.  4. The aortic valve is normal in structure. Aortic valve regurgitation is not visualized. No aortic stenosis is present. FINDINGS  Left Ventricle: Left ventricular ejection fraction, by estimation, is 60 to 65%. The left ventricle has normal function. The left ventricle has no regional wall motion abnormalities. The left ventricular internal cavity size was normal in size. There is  no left ventricular hypertrophy. Left ventricular diastolic parameters are indeterminate. Elevated left ventricular end-diastolic pressure. Right Ventricle: The  right ventricular size is normal. Right vetricular wall thickness was not well visualized. Right ventricular systolic function is normal. Left Atrium: Left atrial size was normal in size. Right Atrium: Right atrial size was normal in size. Pericardium: There is no evidence of pericardial effusion. Mitral Valve: The mitral valve is grossly normal. Mild to moderate mitral valve regurgitation. Tricuspid Valve: The tricuspid valve is grossly normal. Tricuspid valve regurgitation is trivial. Aortic Valve: The aortic valve is normal in structure. Aortic valve regurgitation is not visualized. No aortic stenosis is present. Aortic valve peak gradient measures 8.1 mmHg. Pulmonic Valve: The pulmonic valve was normal in structure. Pulmonic valve regurgitation is not visualized. Aorta: The aortic root and ascending aorta are structurally normal, with no evidence of dilitation.  IAS/Shunts: The atrial septum is grossly normal.  LEFT VENTRICLE PLAX 2D LVIDd:         4.00 cm   Diastology LVIDs:         2.80 cm   LV e' medial:    5.11 cm/s LV PW:         1.40 cm   LV E/e' medial:  23.5 LV IVS:        1.60 cm   LV e' lateral:   4.79 cm/s LVOT diam:     2.00 cm   LV E/e' lateral: 25.1 LV SV:         71 LV SV Index:   37 LVOT Area:     3.14 cm  RIGHT VENTRICLE             IVC RV Basal diam:  2.50 cm     IVC diam: 2.40 cm RV S prime:     14.50 cm/s TAPSE (M-mode): 2.2 cm LEFT ATRIUM             Index        RIGHT ATRIUM           Index LA diam:        4.10 cm 2.17 cm/m   RA Area:     11.80 cm LA Vol (A2C):   50.9 ml 26.94 ml/m  RA Volume:   21.80 ml  11.54 ml/m LA Vol (A4C):   62.3 ml 32.97 ml/m LA Biplane Vol: 59.7 ml 31.59 ml/m  AORTIC VALVE                 PULMONIC VALVE AV Area (Vmax): 2.62 cm     PV Vmax:       1.07 m/s AV Vmax:        142.50 cm/s  PV Peak grad:  4.6 mmHg AV Peak Grad:   8.1 mmHg LVOT Vmax:      119.00 cm/s LVOT Vmean:     79.200 cm/s LVOT VTI:       0.225 m  AORTA Ao Root diam: 3.40 cm Ao Asc diam:  3.80 cm MITRAL VALVE MV Area (PHT): 5.34 cm     SHUNTS MV Decel Time: 142 msec     Systemic VTI:  0.22 m MR Peak grad: 102.4 mmHg    Systemic Diam: 2.00 cm MR Vmax:      506.00 cm/s MV E velocity: 120.00 cm/s Mertie Moores MD Electronically signed by Mertie Moores MD Signature Date/Time: 10/18/2021/3:42:52 PM    Final     Assessment/Plan:  71 y.o. female with history of Parkinsons s/p Left ureteral stent placement for distal 24mm obstructing sotne and urosepsis.  Overall doing well post-op.   E Coli bacteremia  and UTI. Awaiting final sensitivities. VSS. Labs stable.  Recommend a complicated UTI treatment duration of antibiotics for total 14-day course status post stent placement.  We will arrange for a visit in our office for a urine specimen collection as well as arrangement for her ureteroscopic stone extraction.  This has been communicated to the patient and her  accompanying husband and they expressed gratitude and understanding.  -Continue antibiotics tailored per culture data.  Recommend 14-day course. -We will arrange outpatient visit as well as ureteroscopic stone extraction.  Patient understands that the stent is temporary.  Dispo: Per primary team   LOS: 2 days

## 2021-10-20 NOTE — Plan of Care (Signed)
°  Problem: Education: Goal: Knowledge of General Education information will improve Description: Including pain rating scale, medication(s)/side effects and non-pharmacologic comfort measures Outcome: Progressing   Problem: Activity: Goal: Risk for activity intolerance will decrease Outcome: Progressing   Problem: Coping: Goal: Level of anxiety will decrease Outcome: Progressing   Problem: Elimination: Goal: Will not experience complications related to bowel motility Outcome: Progressing Goal: Will not experience complications related to urinary retention Outcome: Progressing   Problem: Pain Managment: Goal: General experience of comfort will improve Outcome: Progressing   Problem: Safety: Goal: Ability to remain free from injury will improve Outcome: Progressing   Problem: Skin Integrity: Goal: Risk for impaired skin integrity will decrease Outcome: Progressing   Problem: Respiratory: Goal: Ability to maintain adequate ventilation will improve Outcome: Progressing

## 2021-10-20 NOTE — Progress Notes (Signed)
Notified on call provider throughout the night about patient's elevated BP, as well as patient getting up frequently to use the bathroom. Patient had several incontinent urinary episodes, which made it difficult to document patient's urinary output. Patient did have sufficient output just through observation of soaking of briefs and pads in the mesh underwear, as well as soiling the bed a few times.

## 2021-10-20 NOTE — Care Management Note (Signed)
Transport chair per the husbands request orders and request sent to [employer].

## 2021-10-20 NOTE — Assessment & Plan Note (Addendum)
-   Blood pressure elevated currently.  Continue lisinopril 10 mg daily upon discharge.  Outpatient follow-up with PCP.

## 2021-10-20 NOTE — Progress Notes (Signed)
Physical Therapy Treatment Patient Details Name: Morgan [name] MRN: [id number] DOB: Sep 07, 1950 Today's Date: 10/20/2021   History of Present Illness Patient is a 71 year old female who was admitted on 10/17/21 with sepsis, UTI, hydronephrosis. patient underwent emergent left uretal stent placement on 2/20. PMH: parkinsons, fibromyalgia, LBBB, hyperlipidemia.    PT Comments    Pt is progressing toward acute PT goals this session with progression of ambulation distance and decreased assist required for mobility. Pt ambulated ~290ft with MIN A-MIN guard for safety and use of RW, ~79ft without use of RW. Pt reports she does not use AD at home, only takes her rollator if she is going for longer trips/vacations. Pt will benefit from continued skilled PT to maximize safety with mobility and work toward progression of MOD-I to independence with mobility.     Recommendations for follow up therapy are one component of a multi-disciplinary discharge planning process, led by the attending physician.  Recommendations may be updated based on patient status, additional functional criteria and insurance authorization.  Follow Up Recommendations  Home health PT     Assistance Recommended at Discharge Frequent or constant Supervision/Assistance  Patient can return home with the following A little help with walking and/or transfers;A little help with bathing/dressing/bathroom;Help with stairs or ramp for entrance   Equipment Recommendations  None recommended by PT    Recommendations for Other Services       Precautions / Restrictions Precautions Precautions: ICD/Pacemaker Restrictions Weight Bearing Restrictions: No     Mobility  Bed Mobility Overal bed mobility: Needs Assistance Bed Mobility: Supine to Sit, Sit to Supine     Supine to sit: HOB elevated, Min assist     General bed mobility comments: Requiring cues for sequencing with assist for BLE and trunk movement -  increased difficulty  coordinating movements with multiple attempts to achieve optimal body mechanics to achieve good seated balance and midline position/foot flat prior to transfers.    Transfers Overall transfer level: Needs assistance Equipment used: Rolling walker (2 wheels) (grab bar) Transfers: Sit to/from Stand Sit to Stand: Min assist           General transfer comment: Pt requiring cues for anterior weight shift and increased time for safe/efficient hand placement to assist with power up to stand from EOB and toilet.    Ambulation/Gait Ambulation/Gait assistance: Min assist, Min guard Gait Distance (Feet): 280 Feet Assistive device: Rolling walker (2 wheels) Gait Pattern/deviations: Decreased stride length, Step-to pattern, Shuffle Gait velocity: decreased     General Gait Details: Initial min A to intiate and for stability without RW while in bathroom. Able to progress to MIN guard for safety with use of RW while in hallway. Ambuated ~54ft without use of AD and pt able to ambulate with MIN guard and no overt LOB observed. Improved fluency of gait as ambulation distance increased. Pt states that she does not use AD around home or out in community/appointments often states she "just walk in". will take her rollator if she goes on longer trips/vacations.   Stairs             Wheelchair Mobility    Modified Rankin (Stroke Patients Only)       Balance Overall balance assessment: Needs assistance Sitting-balance support: Feet supported, Bilateral upper extremity supported Sitting balance-Leahy Scale: Poor     Standing balance support: Bilateral upper extremity supported, During functional activity, No upper extremity supported Standing balance-Leahy Scale: Good  Cognition Arousal/Alertness: Awake/alert Behavior During Therapy: WFL for tasks assessed/performed, Flat affect Overall Cognitive Status: No family/caregiver present to determine  baseline cognitive functioning                                 General Comments: Pt slow to respond at times. Increased time for problem solving with bed mobility and transfers.        Exercises      General Comments        Pertinent Vitals/Pain Pain Assessment Pain Assessment: No/denies pain    Home Living                          Prior Function            PT Goals (current goals can now be found in the care plan section) Acute Rehab PT Goals Patient Stated Goal: return home PT Goal Formulation: With patient Time For Goal Achievement: 11/02/21 Potential to Achieve Goals: Good Progress towards PT goals: Progressing toward goals    Frequency    Min 3X/week      PT Plan Current plan remains appropriate    Co-evaluation              AM-PAC PT "6 Clicks" Mobility   Outcome Measure  Help needed turning from your back to your side while in a flat bed without using bedrails?: A Little Help needed moving from lying on your back to sitting on the side of a flat bed without using bedrails?: A Little Help needed moving to and from a bed to a chair (including a wheelchair)?: A Little Help needed standing up from a chair using your arms (e.g., wheelchair or bedside chair)?: A Little Help needed to walk in hospital room?: A Little Help needed climbing 3-5 steps with a railing? : A Little 6 Click Score: 18    End of Session Equipment Utilized During Treatment: Gait belt Activity Tolerance: Patient tolerated treatment well Patient left: in chair;with call bell/phone within reach;with chair alarm set Nurse Communication: Mobility status PT Visit Diagnosis: Other abnormalities of gait and mobility (R26.89)     Time: 1655-3748 PT Time Calculation (min) (ACUTE ONLY): 28 min  Charges:  $Therapeutic Activity: 23-37 mins                     Festus Barren PT, DPT  Acute Rehabilitation Services  Office [phone]  10/20/2021, 2:59 PM

## 2021-10-21 DIAGNOSIS — I447 Left bundle-branch block, unspecified: Secondary | ICD-10-CM | POA: Diagnosis not present

## 2021-10-21 DIAGNOSIS — R7881 Bacteremia: Secondary | ICD-10-CM | POA: Diagnosis not present

## 2021-10-21 DIAGNOSIS — G9341 Metabolic encephalopathy: Secondary | ICD-10-CM | POA: Diagnosis not present

## 2021-10-21 DIAGNOSIS — N136 Pyonephrosis: Secondary | ICD-10-CM | POA: Diagnosis not present

## 2021-10-21 MED ORDER — CEPHALEXIN 500 MG PO CAPS
500.0000 mg | ORAL_CAPSULE | Freq: Four times a day (QID) | ORAL | 0 refills | Status: AC
Start: 2021-10-21 — End: 2021-10-28

## 2021-10-21 MED ORDER — GABAPENTIN 400 MG PO CAPS: 400.0000 mg | ORAL_CAPSULE | Freq: Three times a day (TID) | ORAL | Status: DC | PRN

## 2021-10-21 MED ORDER — TAMSULOSIN HCL 0.4 MG PO CAPS: 0.4000 mg | ORAL_CAPSULE | Freq: Every day | ORAL | 0 refills | Status: DC

## 2021-10-21 MED ORDER — LISINOPRIL 10 MG PO TABS: 10.0000 mg | ORAL_TABLET | Freq: Every day | ORAL | 0 refills | Status: DC

## 2021-10-21 NOTE — Progress Notes (Signed)
Pt's IV removed, cathter intact. Pt has all belongings. Discharge instructions reviewed with both pt and husband.No additional questions at this time. Pt d/c via wheelchair by NT.

## 2021-10-21 NOTE — TOC Transition Note (Addendum)
Transition of Care [facility]) - CM/SW Discharge Note   Patient Details  Name: Ivorie Uplinger MRN: [id number] Date of Birth: Jan 07, 1951  [facility]) CM/SW Contact:  Leeroy Cha, RN Phone Number: 10/21/2021, 9:45 AM   Clinical Narrative:    Patient discharged to return home with hhc through [facility]. Raised toilet seat ordered through [employer] at 0950.Marland KitchenMaudry [place] toilet seat is a Journalist, newspaper and not covered under insurance   Final next level of care: [place] Barriers to Discharge: Barriers Resolved   Patient Goals and CMS Choice Patient states their goals for this hospitalization and ongoing recovery are:: to go [facility] CMS Medicare.gov Compare Post Acute Care list provided to:: Patient Choice offered to / list presented to : Patient, Spouse  Discharge Placement                       Discharge Plan and Services   Discharge Planning Services: CM Consult Post Acute Care Choice: Home Health                    HH Arranged: PT, OT [facility] Agency: [place] Date [place]: 10/20/21 Time [place]: 6415 Representative spoke with at [place]: stacie  Social Determinants of Health ([place]) Interventions     Readmission Risk Interventions No flowsheet data found.

## 2021-10-21 NOTE — Progress Notes (Signed)
Pt's husband  wanted to know if a 90 day supply of lisinopril could be sent to the pharmacy and if an elevated toilet seat could be ordered. Spoke to provider about 90 day supply and was informed that a 30 day  supply would be sent and pt will follow up with pcp for additional refills in case any changes need to be  made.  Reached out to case manager about ordering the elevated toilet seat and was informed that this is a retail item and not covered by pt's insurance. Pt's husband made aware.

## 2021-10-21 NOTE — Discharge Summary (Signed)
Physician Discharge Summary   Patient: Morgan [name] MRN: [id number] DOB: 03-08-1951  Admit date:     10/17/2021  Discharge date: 10/21/21  Discharge Physician: Aline August   PCP: [name], MD   Recommendations at discharge:   Follow-up with PCP in a week Outpatient follow-up with urology Follow-up in the ED if symptoms worsen or new appear  Hospital Course: 71 y.o. female with a history of parkinson's disease, fibromyalgia, LBBB, hyperlipidemia presented secondary to confusion and fever and was found to have evidence of sepsis secondary to UTI with further evidence of an obstructing left ureteral stone. She was started empirically on Ceftriaxone and Urology was consulted. Patient underwent emergent left ureteral stent placement on 10/18/2021.  She was found to have E. coli bacteremia.  Subsequently, she has remained stable hemodynamically and tolerated diet.  She will be discharged home today on oral Keflex with outpatient follow-up with PCP and urology.  Discharge diagnosis and assessment and Plan: Severe sepsis ([place])- (present on admission) E. Coli UTI bacteremia Left ureterolithiasis with obstruction with hydronephrosis/acute pyelonephritis -underwent emergent and successful left ureteral stent placement on 10/18/2021.  Foley catheter removed on 10/19/2021.  Outpatient follow-up with urology. -Currently on Rocephin.  Blood and urine culture growing E. coli.  -Sepsis has resolved. -Discharge home today on oral Keflex for 7 more days with close outpatient follow-up with PCP and urology.  Acute metabolic encephalopathy - Possibly from sepsis/infection. -Mental status has much improved.  Parkinson disease ([place])- (present on admission) -Continue Sinemet and Cymbalta  LBBB (left bundle branch block)- (present on admission) -Chronic. Normal troponin on admission. -Outpatient follow-up with Dr. Croitoru/cardiology  HTN (hypertension) - Blood pressure elevated currently.   Continue lisinopril 10 mg daily upon discharge.  Outpatient follow-up with PCP.    Hyperlipidemia- (present on admission) -Continue Lipitor  Physical deconditioning - PT/OT recommend home health PT/OT.         Consultants: Urology Procedures performed: Cystoscopy and left ureteral stent placement on 10/18/2021 Disposition: Home Diet recommendation:  Discharge Diet Orders (From admission, onward)     Start     Ordered   10/21/21 0000  Diet - low sodium heart healthy        10/21/21 0908           Cardiac diet  DISCHARGE MEDICATION: Allergies as of 10/21/2021       Reactions   Other Anaphylaxis   "Anti Seizure Medications"   Phenergan [promethazine Hcl] Other (See Comments)   Makes Parkinson worse   Sporanox [itraconazole] Hives   Tylenol [acetaminophen] Other (See Comments)   "I just get weird"        Medication List     STOP taking these medications    AMBULATORY NON FORMULARY MEDICATION   Pramipexole Dihydrochloride 0.75 MG Tb24       TAKE these medications    ALPRAZolam 0.25 MG tablet Commonly known as: XANAX Take 0.25 mg by mouth daily as needed for anxiety.   aspirin 325 MG tablet Take 325-650 mg by mouth daily as needed (for headaches/pain.).   atorvastatin 10 MG tablet Commonly known as: LIPITOR Take 10 mg by mouth in the morning.   carbidopa-levodopa 25-100 MG tablet Commonly known as: SINEMET IR TAKE 1 TABLET EVERY 2 HOURSUP TO 8 TABLETS PER DAY What changed: additional instructions   cephALEXin 500 MG capsule Commonly known as: KEFLEX Take 1 capsule (500 mg total) by mouth 4 (four) times daily for 7 days.   Cymbalta 60 MG capsule Generic drug:  DULoxetine TAKE 1 CAPSULE DAILY   gabapentin 400 MG capsule Commonly known as: NEURONTIN Take 1 capsule (400 mg total) by mouth 3 (three) times daily as needed (pain). What changed: See the new instructions.   lisinopril 10 MG tablet Commonly known as: ZESTRIL Take 1 tablet (10 mg  total) by mouth daily.   methocarbamol 750 MG tablet Commonly known as: ROBAXIN Take 750 mg by mouth every 6 (six) hours as needed for muscle spasms.   PRESERVISION AREDS 2 PO Take 1 tablet by mouth 2 (two) times daily.   QUNOL ULTRA COQ10 PO Take 1 capsule by mouth daily.   raloxifene 60 MG tablet Commonly known as: EVISTA Take 60 mg by mouth daily.   ramelteon 8 MG tablet Commonly known as: ROZEREM TAKE 1 TABLET AT BEDTIME What changed: how to take this   tamsulosin 0.4 MG Caps capsule Commonly known as: FLOMAX Take 1 capsule (0.4 mg total) by mouth daily after supper.   Vitamin D-3 125 MCG (5000 UT) Tabs Take 5,000 Units by mouth 2 (two) times daily.               Durable Medical Equipment  (From admission, onward)           Start     Ordered   10/21/21 0948  For home use only DME Other see comment  Once       Comments: Raised toilet seat  Question:  Length of Need  Answer:  Lifetime   10/21/21 0948   10/20/21 0909  For home use only DME Other see comment  Once       Comments: Transport chair.  Not a rollator but a chair that has four small wheels and can used as a seat or walker.  Question:  Length of Need  Answer:  Lifetime   10/20/21 0911            Follow-up Information     Festus Aloe, MD. Call.   Specialty: Urology Why: Be sure to follow-up with Dr. Junious Silk to plan stent/stone removal Contact information: [place] [address] [phone]         [facility] [place] Follow up.   Specialty: Home Health Services Why: they will call to make the first home appointment. Contact information: [address] [place] [address] [phone]               Subjective: Patient seen and examined at bedside.  Feels okay to go home today.  Denies worsening fever, nausea or vomiting.  Discharge Exam: Filed Weights   10/17/21 1357 10/18/21 0723  Weight: 79.4 kg 79.4 kg   General: No acute  distress, currently on room air.  Slow to respond. respiratory: Decreased breath sounds at bases bilaterally, no wheezing CVS: Rate controlled, S1-S2 heard Abdominal: Soft, nontender, nondistended, no organomegaly, bowel sounds heard Extremities: No cyanosis, clubbing, edema  Condition at discharge: fair  The results of significant diagnostics from this hospitalization (including imaging, microbiology, ancillary and laboratory) are listed below for reference.   Imaging Studies: CT Angio Chest Pulmonary Embolism (PE) W or WO Contrast  Result Date: 10/17/2021 CLINICAL DATA:  PE suspected.  Evaluate for sepsis. EXAM: CT CHEST, ABDOMEN, AND PELVIS WITH CONTRAST TECHNIQUE: Multidetector CT imaging of the chest, abdomen and pelvis was performed following the standard protocol during bolus administration of intravenous contrast. RADIATION DOSE REDUCTION: This exam was performed according to the departmental dose-optimization program which includes automated exposure  control, adjustment of the mA and/or kV according to patient size and/or use of iterative reconstruction technique. CONTRAST:  196mL OMNIPAQUE IOHEXOL 350 MG/ML  SOLN COMPARISON:  CT chest 09/03/2008 FINDINGS: CT CHEST FINDINGS Cardiovascular: Satisfactory opacification to the level of the segmental pulmonary arteries. No pulmonary embolism. Normal heart size. No pericardial effusion. Aortic calcifications. Mediastinum/Nodes: No enlarged mediastinal, hilar, or axillary lymph nodes. Thyroid gland, trachea, and esophagus demonstrate no significant findings. Lungs/Pleura: Lungs are clear. Minimal dependent subsegmental atelectasis. No pleural effusion or pneumothorax. Musculoskeletal: No chest wall mass or suspicious bone lesions identified. CT ABDOMEN PELVIS FINDINGS Hepatobiliary: No focal liver abnormality is seen. Several gallstones measuring up to 1.7 cm. No gallbladder wall thickening or pericholecystic fluid. The extrahepatic bile duct is  dilated measuring 1.0 cm in diameter (series 2, image 29; series 5, image 41). No discrete stone identified within the extrahepatic bile duct. No intrahepatic bile duct dilatation. Pancreas: Unremarkable. No pancreatic ductal dilatation or surrounding inflammatory changes. Spleen: Normal in size without focal abnormality. Adrenals/Urinary Tract: Adrenal glands are unremarkable. Mild left hydroureteronephrosis secondary to a 0.2 cm stone in the distal third of the left ureter (series 2, image 73). Normal right kidney. Normal appearance of the mildly distended urinary bladder. Stomach/Bowel: Stomach is within normal limits. The appendix is not directly visualized, however no pericecal inflammatory changes. Diverticulosis of the sigmoid colon. No evidence of bowel wall thickening, distention, or inflammatory changes. Vascular/Lymphatic: Aortic atherosclerosis. No enlarged abdominal or pelvic lymph nodes. Reproductive: Uterus and bilateral adnexa are unremarkable. Other: No abdominal wall hernia or abnormality. No abdominopelvic ascites. Musculoskeletal: Rightward curvature of the lumbar spine centered at the L1-L2 level. Multilevel degenerative disc disease in the lumbar spine, most severe at L1-L2 and L2-L3. No acute or suspicious osseous findings. IMPRESSION: CTA chest: 1. No pulmonary embolism. 2. Lungs are clear. 3.  Aortic Atherosclerosis (ICD10-I70.0). CT abdomen/pelvis: 1. Mild left hydroureteronephrosis secondary to a 0.2 cm stone in the distal third of the left ureter. 2. Cholelithiasis without findings of cholecystitis. 3. Sigmoid diverticulosis without findings of diverticulitis. Electronically Signed   By: Ileana Roup M.D.   On: 10/17/2021 21:34   CT ABDOMEN PELVIS W CONTRAST  Result Date: 10/17/2021 CLINICAL DATA:  PE suspected.  Evaluate for sepsis. EXAM: CT CHEST, ABDOMEN, AND PELVIS WITH CONTRAST TECHNIQUE: Multidetector CT imaging of the chest, abdomen and pelvis was performed following the  standard protocol during bolus administration of intravenous contrast. RADIATION DOSE REDUCTION: This exam was performed according to the departmental dose-optimization program which includes automated exposure control, adjustment of the mA and/or kV according to patient size and/or use of iterative reconstruction technique. CONTRAST:  184mL OMNIPAQUE IOHEXOL 350 MG/ML  SOLN COMPARISON:  CT chest 09/03/2008 FINDINGS: CT CHEST FINDINGS Cardiovascular: Satisfactory opacification to the level of the segmental pulmonary arteries. No pulmonary embolism. Normal heart size. No pericardial effusion. Aortic calcifications. Mediastinum/Nodes: No enlarged mediastinal, hilar, or axillary lymph nodes. Thyroid gland, trachea, and esophagus demonstrate no significant findings. Lungs/Pleura: Lungs are clear. Minimal dependent subsegmental atelectasis. No pleural effusion or pneumothorax. Musculoskeletal: No chest wall mass or suspicious bone lesions identified. CT ABDOMEN PELVIS FINDINGS Hepatobiliary: No focal liver abnormality is seen. Several gallstones measuring up to 1.7 cm. No gallbladder wall thickening or pericholecystic fluid. The extrahepatic bile duct is dilated measuring 1.0 cm in diameter (series 2, image 29; series 5, image 41). No discrete stone identified within the extrahepatic bile duct. No intrahepatic bile duct dilatation. Pancreas: Unremarkable. No pancreatic ductal dilatation or surrounding inflammatory changes.  Spleen: Normal in size without focal abnormality. Adrenals/Urinary Tract: Adrenal glands are unremarkable. Mild left hydroureteronephrosis secondary to a 0.2 cm stone in the distal third of the left ureter (series 2, image 73). Normal right kidney. Normal appearance of the mildly distended urinary bladder. Stomach/Bowel: Stomach is within normal limits. The appendix is not directly visualized, however no pericecal inflammatory changes. Diverticulosis of the sigmoid colon. No evidence of bowel wall  thickening, distention, or inflammatory changes. Vascular/Lymphatic: Aortic atherosclerosis. No enlarged abdominal or pelvic lymph nodes. Reproductive: Uterus and bilateral adnexa are unremarkable. Other: No abdominal wall hernia or abnormality. No abdominopelvic ascites. Musculoskeletal: Rightward curvature of the lumbar spine centered at the L1-L2 level. Multilevel degenerative disc disease in the lumbar spine, most severe at L1-L2 and L2-L3. No acute or suspicious osseous findings. IMPRESSION: CTA chest: 1. No pulmonary embolism. 2. Lungs are clear. 3.  Aortic Atherosclerosis (ICD10-I70.0). CT abdomen/pelvis: 1. Mild left hydroureteronephrosis secondary to a 0.2 cm stone in the distal third of the left ureter. 2. Cholelithiasis without findings of cholecystitis. 3. Sigmoid diverticulosis without findings of diverticulitis. Electronically Signed   By: Ileana Roup M.D.   On: 10/17/2021 21:34   DG Chest Port 1 View  Result Date: 10/17/2021 CLINICAL DATA:  Questionable sepsis, foul-smelling urine, history of UTI EXAM: PORTABLE CHEST 1 VIEW COMPARISON:  12/21/2015 FINDINGS: Cardiomegaly. Both lungs are clear. The visualized skeletal structures are unremarkable. IMPRESSION: Cardiomegaly without acute abnormality of the lungs in AP portable projection. Electronically Signed   By: Delanna Ahmadi M.D.   On: 10/17/2021 15:13   DG C-Arm 1-60 Min-No Report  Result Date: 10/18/2021 Fluoroscopy was utilized by the requesting physician.  No radiographic interpretation.   ECHOCARDIOGRAM COMPLETE  Result Date: 10/18/2021    ECHOCARDIOGRAM REPORT   Patient Name:   Morgan [name] Date of Exam: 10/18/2021 Medical Rec #:  [id number]       Height:       66.0 in Accession #:    [id number]      Weight:       175.0 lb Date of Birth:  Oct 20, 1950       BSA:          1.890 m Patient Age:    39 years        BP:           130/67 mmHg Patient Gender: F               HR:           95 bpm. Exam Location:  Inpatient Procedure: 2D Echo  Indications:    Cardiomegaly  History:        Patient has prior history of Echocardiogram examinations, most                 recent 01/11/2016. Arrythmias:Tachycardia and LBBB.  Sonographer:    Jefferey Pica Referring Phys: [place]  1. Left ventricular ejection fraction, by estimation, is 60 to 65%. The left ventricle has normal function. The left ventricle has no regional wall motion abnormalities. Left ventricular diastolic parameters are indeterminate. Elevated left ventricular end-diastolic pressure.  2. Right ventricular systolic function is normal. The right ventricular size is normal.  3. The mitral valve is grossly normal. Mild to moderate mitral valve regurgitation.  4. The aortic valve is normal in structure. Aortic valve regurgitation is not visualized. No aortic stenosis is present. FINDINGS  Left Ventricle: Left ventricular ejection fraction, by estimation, is 60 to 65%. The left  ventricle has normal function. The left ventricle has no regional wall motion abnormalities. The left ventricular internal cavity size was normal in size. There is  no left ventricular hypertrophy. Left ventricular diastolic parameters are indeterminate. Elevated left ventricular end-diastolic pressure. Right Ventricle: The right ventricular size is normal. Right vetricular wall thickness was not well visualized. Right ventricular systolic function is normal. Left Atrium: Left atrial size was normal in size. Right Atrium: Right atrial size was normal in size. Pericardium: There is no evidence of pericardial effusion. Mitral Valve: The mitral valve is grossly normal. Mild to moderate mitral valve regurgitation. Tricuspid Valve: The tricuspid valve is grossly normal. Tricuspid valve regurgitation is trivial. Aortic Valve: The aortic valve is normal in structure. Aortic valve regurgitation is not visualized. No aortic stenosis is present. Aortic valve peak gradient measures 8.1 mmHg. Pulmonic Valve:  The pulmonic valve was normal in structure. Pulmonic valve regurgitation is not visualized. Aorta: The aortic root and ascending aorta are structurally normal, with no evidence of dilitation. IAS/Shunts: The atrial septum is grossly normal.  LEFT VENTRICLE PLAX 2D LVIDd:         4.00 cm   Diastology LVIDs:         2.80 cm   LV e' medial:    5.11 cm/s LV PW:         1.40 cm   LV E/e' medial:  23.5 LV IVS:        1.60 cm   LV e' lateral:   4.79 cm/s LVOT diam:     2.00 cm   LV E/e' lateral: 25.1 LV SV:         71 LV SV Index:   37 LVOT Area:     3.14 cm  RIGHT VENTRICLE             IVC RV Basal diam:  2.50 cm     IVC diam: 2.40 cm RV S prime:     14.50 cm/s TAPSE (M-mode): 2.2 cm LEFT ATRIUM             Index        RIGHT ATRIUM           Index LA diam:        4.10 cm 2.17 cm/m   RA Area:     11.80 cm LA Vol (A2C):   50.9 ml 26.94 ml/m  RA Volume:   21.80 ml  11.54 ml/m LA Vol (A4C):   62.3 ml 32.97 ml/m LA Biplane Vol: 59.7 ml 31.59 ml/m  AORTIC VALVE                 PULMONIC VALVE AV Area (Vmax): 2.62 cm     PV Vmax:       1.07 m/s AV Vmax:        142.50 cm/s  PV Peak grad:  4.6 mmHg AV Peak Grad:   8.1 mmHg LVOT Vmax:      119.00 cm/s LVOT Vmean:     79.200 cm/s LVOT VTI:       0.225 m  AORTA Ao Root diam: 3.40 cm Ao Asc diam:  3.80 cm MITRAL VALVE MV Area (PHT): 5.34 cm     SHUNTS MV Decel Time: 142 msec     Systemic VTI:  0.22 m MR Peak grad: 102.4 mmHg    Systemic Diam: 2.00 cm MR Vmax:      506.00 cm/s MV E velocity: 120.00 cm/s Mertie Moores MD Electronically signed by Mertie Moores  MD Signature Date/Time: 10/18/2021/3:42:52 PM    Final     Microbiology: Results for orders placed or performed during the hospital encounter of 10/17/21  Resp Panel by RT-PCR (Flu A&B, Covid) Peripheral     Status: None   Collection Time: 10/17/21  3:04 PM   Specimen: Peripheral; Nasopharyngeal(NP) swabs in vial transport medium  Result Value Ref Range Status   SARS Coronavirus 2 by RT PCR NEGATIVE NEGATIVE Final     Comment: (NOTE) SARS-CoV-2 target nucleic acids are NOT DETECTED.  The SARS-CoV-2 RNA is generally detectable in upper respiratory specimens during the acute phase of infection. The lowest concentration of SARS-CoV-2 viral copies this assay can detect is 138 copies/mL. A negative result does not preclude SARS-Cov-2 infection and should not be used as the sole basis for treatment or other patient management decisions. A negative result may occur with  improper specimen collection/handling, submission of specimen other than nasopharyngeal swab, presence of viral mutation(s) within the areas targeted by this assay, and inadequate number of viral copies(<138 copies/mL). A negative result must be combined with clinical observations, patient history, and epidemiological information. The expected result is Negative.  Fact Sheet for Patients:  EntrepreneurPulse.com.au  Fact Sheet for Healthcare Providers:  IncredibleEmployment.be  This test is no t yet approved or cleared by the [place] FDA and  has been authorized for detection and/or diagnosis of SARS-CoV-2 by FDA under an Emergency Use Authorization (EUA). This EUA will remain  in effect (meaning this test can be used) for the duration of the COVID-19 declaration under Section 564(b)(1) of the Act, 21 U.S.C.section 360bbb-3(b)(1), unless the authorization is terminated  or revoked sooner.       Influenza A by PCR NEGATIVE NEGATIVE Final   Influenza B by PCR NEGATIVE NEGATIVE Final    Comment: (NOTE) The Xpert Xpress SARS-CoV-2/FLU/RSV plus assay is intended as an aid in the diagnosis of influenza from Nasopharyngeal swab specimens and should not be used as a sole basis for treatment. Nasal washings and aspirates are unacceptable for Xpert Xpress SARS-CoV-2/FLU/RSV testing.  Fact Sheet for Patients: EntrepreneurPulse.com.au  Fact Sheet for Healthcare  Providers: IncredibleEmployment.be  This test is not yet approved or cleared by the [place] FDA and has been authorized for detection and/or diagnosis of SARS-CoV-2 by FDA under an Emergency Use Authorization (EUA). This EUA will remain in effect (meaning this test can be used) for the duration of the COVID-19 declaration under Section 564(b)(1) of the Act, 21 U.S.C. section 360bbb-3(b)(1), unless the authorization is terminated or revoked.  Performed at [facility], [place] [address] [place], [place] [address]   Blood Culture (routine x 2)     Status: None (Preliminary result)   Collection Time: 10/17/21  3:04 PM   Specimen: BLOOD  Result Value Ref Range Status   Specimen Description   Final    BLOOD RIGHT ANTECUBITAL Performed at [facility] [address] [place], [place] [address]    Special Requests   Final    BOTTLES DRAWN AEROBIC AND ANAEROBIC Blood Culture adequate volume Performed at [facility] [address], [place], [place] [address]    Culture   Final    NO GROWTH 4 DAYS Performed at [facility], [place] [address], [place], [place] [address]    Report Status PENDING  Incomplete  Urine Culture     Status: Abnormal   Collection Time: 10/17/21  3:04 PM   Specimen: In/Out Cath Urine  Result Value Ref Range Status   Specimen Description   Final    IN/OUT CATH URINE Performed at [facility] [address] [place], [place] [address]    Special Requests   Final    NONE Performed at [facility], [place] [address], [place], [place] [address]    Culture >=100,000 COLONIES/mL ESCHERICHIA COLI (A)  Final   Report Status 10/19/2021 FINAL  Final   Organism ID, Bacteria ESCHERICHIA COLI (A)  Final      Susceptibility   Escherichia coli - MIC*    AMPICILLIN 8 SENSITIVE Sensitive     CEFAZOLIN <=4 SENSITIVE Sensitive     CEFEPIME <=0.12  SENSITIVE Sensitive     CEFTRIAXONE <=0.25 SENSITIVE Sensitive     CIPROFLOXACIN <=0.25 SENSITIVE Sensitive     GENTAMICIN <=1 SENSITIVE Sensitive     IMIPENEM <=0.25 SENSITIVE Sensitive     NITROFURANTOIN <=16 SENSITIVE Sensitive     TRIMETH/SULFA <=20 SENSITIVE Sensitive     AMPICILLIN/SULBACTAM <=2 SENSITIVE Sensitive     PIP/TAZO <=4 SENSITIVE Sensitive     * >=100,000 COLONIES/mL ESCHERICHIA COLI  Blood Culture (routine x 2)     Status: Abnormal   Collection Time: 10/17/21  3:06 PM   Specimen: BLOOD  Result Value Ref Range Status   Specimen Description   Final    BLOOD LEFT ANTECUBITAL Performed at [facility] [address] [place], [place] [address]    Special Requests   Final    BOTTLES DRAWN AEROBIC AND ANAEROBIC Blood Culture adequate volume Performed at [facility] [address], [place], [place] [address]    Culture  Setup Time   Final    GRAM NEGATIVE RODS AEROBIC BOTTLE ONLY CRITICAL RESULT CALLED TO, READ BACK BY AND VERIFIED WITH: PHARM D J.LEGGE ON [id number] AT 1035 BY E.PARRISH Performed at [facility], [place] [address], [place], [place] [address]    Culture ESCHERICHIA COLI (A)  Final   Report Status 10/20/2021 FINAL  Final   Organism ID, Bacteria ESCHERICHIA COLI  Final      Susceptibility   Escherichia coli - MIC*    AMPICILLIN 4 SENSITIVE Sensitive     CEFAZOLIN <=4 SENSITIVE Sensitive     CEFEPIME <=0.12 SENSITIVE Sensitive     CEFTAZIDIME <=1 SENSITIVE Sensitive     CEFTRIAXONE <=0.25 SENSITIVE Sensitive     CIPROFLOXACIN <=0.25 SENSITIVE Sensitive     GENTAMICIN <=1 SENSITIVE Sensitive     IMIPENEM <=0.25 SENSITIVE Sensitive     TRIMETH/SULFA <=20 SENSITIVE Sensitive     AMPICILLIN/SULBACTAM <=2 SENSITIVE Sensitive     PIP/TAZO <=4 SENSITIVE Sensitive     * ESCHERICHIA COLI  Blood Culture ID Panel (Reflexed)     Status: Abnormal   Collection Time: 10/17/21  3:06 PM  Result Value Ref Range Status    Enterococcus faecalis NOT DETECTED NOT DETECTED Final   Enterococcus Faecium NOT DETECTED NOT DETECTED Final   Listeria monocytogenes NOT DETECTED NOT DETECTED Final   Staphylococcus species NOT DETECTED NOT DETECTED Final   Staphylococcus aureus (BCID) NOT DETECTED NOT DETECTED Final   Staphylococcus epidermidis NOT DETECTED NOT DETECTED Final   Staphylococcus lugdunensis NOT DETECTED NOT DETECTED Final   Streptococcus species NOT DETECTED NOT DETECTED Final   Streptococcus agalactiae NOT DETECTED NOT DETECTED Final   Streptococcus pneumoniae NOT DETECTED NOT DETECTED Final   Streptococcus pyogenes NOT DETECTED NOT DETECTED Final   A.calcoaceticus-baumannii NOT DETECTED NOT DETECTED  Final   Bacteroides fragilis NOT DETECTED NOT DETECTED Final   Enterobacterales DETECTED (A) NOT DETECTED Final    Comment: Enterobacterales represent a large order of gram negative bacteria, not a single organism. CRITICAL RESULT CALLED TO, READ BACK BY AND VERIFIED WITH: PHARM D J.LEGGE ON [id number] AT 2536 BY E.PARRISH    Enterobacter cloacae complex NOT DETECTED NOT DETECTED Final   Escherichia coli DETECTED (A) NOT DETECTED Final    Comment: CRITICAL RESULT CALLED TO, READ BACK BY AND VERIFIED WITH: PHARM D J.Sc[place] ON [id number] AT [phone] BY E.PARRISH    Klebsiella aerogenes NOT DETECTED NOT DETECTED Final   Klebsiella oxytoca NOT DETECTED NOT DETECTED Final   Klebsiella pneumoniae NOT DETECTED NOT DETECTED Final   Proteus species NOT DETECTED NOT DETECTED Final   Salmonella species NOT DETECTED NOT DETECTED Final   Serratia marcescens NOT DETECTED NOT DETECTED Final   Haemophilus influenzae NOT DETECTED NOT DETECTED Final   Neisseria meningitidis NOT DETECTED NOT DETECTED Final   Pseudomonas aeruginosa NOT DETECTED NOT DETECTED Final   Stenotrophomonas maltophilia NOT DETECTED NOT DETECTED Final   Candida albicans NOT DETECTED NOT DETECTED Final   Candida auris NOT DETECTED NOT DETECTED Final    Candida glabrata NOT DETECTED NOT DETECTED Final   Candida krusei NOT DETECTED NOT DETECTED Final   Candida parapsilosis NOT DETECTED NOT DETECTED Final   Candida tropicalis NOT DETECTED NOT DETECTED Final   Cryptococcus neoformans/gattii NOT DETECTED NOT DETECTED Final   CTX-M ESBL NOT DETECTED NOT DETECTED Final   Carbapenem resistance IMP NOT DETECTED NOT DETECTED Final   Carbapenem resistance KPC NOT DETECTED NOT DETECTED Final   Carbapenem resistance NDM NOT DETECTED NOT DETECTED Final   Carbapenem resist OXA 48 LIKE NOT DETECTED NOT DETECTED Final   Carbapenem resistance VIM NOT DETECTED NOT DETECTED Final    Comment: Performed at [facility], [place] [address], [place], [place] [address]    Labs: CBC: Recent Labs  Lab 10/17/21 1504 10/18/21 0547 10/19/21 0347 10/20/21 0348  WBC 14.5* 12.2* 11.6* 8.1  NEUTROABS 12.6* 10.1*  --   --   HGB 12.5 10.8* 11.3* 10.9*  HCT 39.4 34.4* 35.6* 34.4*  MCV 90.2 90.8 90.1 89.6  PLT 230 181 203 643   Basic Metabolic Panel: Recent Labs  Lab 10/17/21 1504 10/17/21 1839 10/18/21 0547 10/19/21 0347  NA 135  --  135  --   K 3.2*  --  3.3* 4.1  CL 102  --  104  --   CO2 27  --  24  --   GLUCOSE 121*  --  107*  --   BUN 13  --  11  --   CREATININE 0.73  --  0.67  --   CALCIUM 8.7*  --  8.1*  --   MG  --  2.0 1.8  --   PHOS  --  2.6 3.0  --    Liver Function Tests: Recent Labs  Lab 10/17/21 1504 10/18/21 0547  AST 15 13*  ALT 8 7  ALKPHOS 101 85  BILITOT 0.8 0.8  PROT 7.3 6.4*  ALBUMIN 3.9 3.1*   CBG: No results for input(s): GLUCAP in the last 168 hours.  Discharge time spent: greater than 30 minutes.  Signed: Aline August, MD [facility] 10/21/2021

## 2021-10-22 DIAGNOSIS — N136 Pyonephrosis: Secondary | ICD-10-CM | POA: Diagnosis not present

## 2021-10-22 DIAGNOSIS — K8020 Calculus of gallbladder without cholecystitis without obstruction: Secondary | ICD-10-CM | POA: Diagnosis not present

## 2021-10-22 DIAGNOSIS — I341 Nonrheumatic mitral (valve) prolapse: Secondary | ICD-10-CM | POA: Diagnosis not present

## 2021-10-22 DIAGNOSIS — M8580 Other specified disorders of bone density and structure, unspecified site: Secondary | ICD-10-CM | POA: Diagnosis not present

## 2021-10-22 DIAGNOSIS — G609 Hereditary and idiopathic neuropathy, unspecified: Secondary | ICD-10-CM | POA: Diagnosis not present

## 2021-10-22 DIAGNOSIS — Z960 Presence of urogenital implants: Secondary | ICD-10-CM | POA: Diagnosis not present

## 2021-10-22 DIAGNOSIS — Z9181 History of falling: Secondary | ICD-10-CM | POA: Diagnosis not present

## 2021-10-22 DIAGNOSIS — M797 Fibromyalgia: Secondary | ICD-10-CM | POA: Diagnosis not present

## 2021-10-22 DIAGNOSIS — F419 Anxiety disorder, unspecified: Secondary | ICD-10-CM | POA: Diagnosis not present

## 2021-10-22 DIAGNOSIS — Z9851 Tubal ligation status: Secondary | ICD-10-CM | POA: Diagnosis not present

## 2021-10-22 DIAGNOSIS — E7800 Pure hypercholesterolemia, unspecified: Secondary | ICD-10-CM | POA: Diagnosis not present

## 2021-10-22 DIAGNOSIS — I119 Hypertensive heart disease without heart failure: Secondary | ICD-10-CM | POA: Diagnosis not present

## 2021-10-22 DIAGNOSIS — G9341 Metabolic encephalopathy: Secondary | ICD-10-CM | POA: Diagnosis not present

## 2021-10-22 DIAGNOSIS — K08409 Partial loss of teeth, unspecified cause, unspecified class: Secondary | ICD-10-CM | POA: Diagnosis not present

## 2021-10-22 DIAGNOSIS — N390 Urinary tract infection, site not specified: Secondary | ICD-10-CM | POA: Diagnosis not present

## 2021-10-22 DIAGNOSIS — C439 Malignant melanoma of skin, unspecified: Secondary | ICD-10-CM | POA: Diagnosis not present

## 2021-10-22 DIAGNOSIS — G20 Parkinson's disease: Secondary | ICD-10-CM | POA: Diagnosis not present

## 2021-10-22 DIAGNOSIS — Z792 Long term (current) use of antibiotics: Secondary | ICD-10-CM | POA: Diagnosis not present

## 2021-10-22 DIAGNOSIS — I447 Left bundle-branch block, unspecified: Secondary | ICD-10-CM | POA: Diagnosis not present

## 2021-10-22 DIAGNOSIS — Z87891 Personal history of nicotine dependence: Secondary | ICD-10-CM | POA: Diagnosis not present

## 2021-10-22 DIAGNOSIS — Z7982 Long term (current) use of aspirin: Secondary | ICD-10-CM | POA: Diagnosis not present

## 2021-10-22 DIAGNOSIS — I493 Ventricular premature depolarization: Secondary | ICD-10-CM | POA: Diagnosis not present

## 2021-10-22 LAB — CULTURE, BLOOD (ROUTINE X 2)
Culture: NO GROWTH
Special Requests: ADEQUATE

## 2021-10-25 DIAGNOSIS — G609 Hereditary and idiopathic neuropathy, unspecified: Secondary | ICD-10-CM | POA: Diagnosis not present

## 2021-10-25 DIAGNOSIS — G20 Parkinson's disease: Secondary | ICD-10-CM | POA: Diagnosis not present

## 2021-10-25 DIAGNOSIS — N390 Urinary tract infection, site not specified: Secondary | ICD-10-CM | POA: Diagnosis not present

## 2021-10-25 DIAGNOSIS — N132 Hydronephrosis with renal and ureteral calculous obstruction: Secondary | ICD-10-CM | POA: Diagnosis not present

## 2021-10-25 DIAGNOSIS — I119 Hypertensive heart disease without heart failure: Secondary | ICD-10-CM | POA: Diagnosis not present

## 2021-10-25 DIAGNOSIS — N136 Pyonephrosis: Secondary | ICD-10-CM | POA: Diagnosis not present

## 2021-10-25 DIAGNOSIS — G9341 Metabolic encephalopathy: Secondary | ICD-10-CM | POA: Diagnosis not present

## 2021-10-25 DIAGNOSIS — I10 Essential (primary) hypertension: Secondary | ICD-10-CM | POA: Diagnosis not present

## 2021-10-25 DIAGNOSIS — A4151 Sepsis due to Escherichia coli [E. coli]: Secondary | ICD-10-CM | POA: Diagnosis not present

## 2021-10-25 DIAGNOSIS — Z09 Encounter for follow-up examination after completed treatment for conditions other than malignant neoplasm: Secondary | ICD-10-CM | POA: Diagnosis not present

## 2021-10-27 DIAGNOSIS — G609 Hereditary and idiopathic neuropathy, unspecified: Secondary | ICD-10-CM | POA: Diagnosis not present

## 2021-10-27 DIAGNOSIS — N136 Pyonephrosis: Secondary | ICD-10-CM | POA: Diagnosis not present

## 2021-10-27 DIAGNOSIS — I119 Hypertensive heart disease without heart failure: Secondary | ICD-10-CM | POA: Diagnosis not present

## 2021-10-27 DIAGNOSIS — N390 Urinary tract infection, site not specified: Secondary | ICD-10-CM | POA: Diagnosis not present

## 2021-10-27 DIAGNOSIS — G9341 Metabolic encephalopathy: Secondary | ICD-10-CM | POA: Diagnosis not present

## 2021-10-27 DIAGNOSIS — G20 Parkinson's disease: Secondary | ICD-10-CM | POA: Diagnosis not present

## 2021-10-28 DIAGNOSIS — G20 Parkinson's disease: Secondary | ICD-10-CM | POA: Diagnosis not present

## 2021-10-28 DIAGNOSIS — G609 Hereditary and idiopathic neuropathy, unspecified: Secondary | ICD-10-CM | POA: Diagnosis not present

## 2021-10-28 DIAGNOSIS — G9341 Metabolic encephalopathy: Secondary | ICD-10-CM | POA: Diagnosis not present

## 2021-10-28 DIAGNOSIS — N390 Urinary tract infection, site not specified: Secondary | ICD-10-CM | POA: Diagnosis not present

## 2021-10-28 DIAGNOSIS — N136 Pyonephrosis: Secondary | ICD-10-CM | POA: Diagnosis not present

## 2021-10-28 DIAGNOSIS — I119 Hypertensive heart disease without heart failure: Secondary | ICD-10-CM | POA: Diagnosis not present

## 2021-10-29 DIAGNOSIS — N201 Calculus of ureter: Secondary | ICD-10-CM | POA: Diagnosis not present

## 2021-11-01 DIAGNOSIS — N390 Urinary tract infection, site not specified: Secondary | ICD-10-CM | POA: Diagnosis not present

## 2021-11-01 DIAGNOSIS — G9341 Metabolic encephalopathy: Secondary | ICD-10-CM | POA: Diagnosis not present

## 2021-11-01 DIAGNOSIS — I119 Hypertensive heart disease without heart failure: Secondary | ICD-10-CM | POA: Diagnosis not present

## 2021-11-01 DIAGNOSIS — G609 Hereditary and idiopathic neuropathy, unspecified: Secondary | ICD-10-CM | POA: Diagnosis not present

## 2021-11-01 DIAGNOSIS — G20 Parkinson's disease: Secondary | ICD-10-CM | POA: Diagnosis not present

## 2021-11-01 DIAGNOSIS — N136 Pyonephrosis: Secondary | ICD-10-CM | POA: Diagnosis not present

## 2021-11-03 DIAGNOSIS — G609 Hereditary and idiopathic neuropathy, unspecified: Secondary | ICD-10-CM | POA: Diagnosis not present

## 2021-11-03 DIAGNOSIS — I119 Hypertensive heart disease without heart failure: Secondary | ICD-10-CM | POA: Diagnosis not present

## 2021-11-03 DIAGNOSIS — G9341 Metabolic encephalopathy: Secondary | ICD-10-CM | POA: Diagnosis not present

## 2021-11-03 DIAGNOSIS — N390 Urinary tract infection, site not specified: Secondary | ICD-10-CM | POA: Diagnosis not present

## 2021-11-03 DIAGNOSIS — G20 Parkinson's disease: Secondary | ICD-10-CM | POA: Diagnosis not present

## 2021-11-03 DIAGNOSIS — N136 Pyonephrosis: Secondary | ICD-10-CM | POA: Diagnosis not present

## 2021-11-04 DIAGNOSIS — N136 Pyonephrosis: Secondary | ICD-10-CM | POA: Diagnosis not present

## 2021-11-04 DIAGNOSIS — G20 Parkinson's disease: Secondary | ICD-10-CM | POA: Diagnosis not present

## 2021-11-04 DIAGNOSIS — G609 Hereditary and idiopathic neuropathy, unspecified: Secondary | ICD-10-CM | POA: Diagnosis not present

## 2021-11-04 DIAGNOSIS — I119 Hypertensive heart disease without heart failure: Secondary | ICD-10-CM | POA: Diagnosis not present

## 2021-11-04 DIAGNOSIS — N390 Urinary tract infection, site not specified: Secondary | ICD-10-CM | POA: Diagnosis not present

## 2021-11-04 DIAGNOSIS — G9341 Metabolic encephalopathy: Secondary | ICD-10-CM | POA: Diagnosis not present

## 2021-11-09 ENCOUNTER — Telehealth: Payer: Self-pay | Admitting: Cardiovascular Disease

## 2021-11-09 NOTE — Telephone Encounter (Signed)
? ? ? ?  Pre-operative Risk Assessment  ?  ?Patient Name: Morgan [name]  ?DOB: [birth year] ?MRN: [id number]  ? ?  ? ?Request for Surgical Clearance   ? ?Procedure:   cystoscopy holmium laser lithotripsy stent placement ? ?Date of Surgery:  Clearance TBD                              ?   ?Surgeon:  Dr. Cristela Felt ?Surgeon's Group or Practice Name:  [facility] ?Phone number:  [phone] ext [phone]  ?Fax number:  [phone] ?  ?Type of Clearance Requested:   ?- Medical  ?  ?Type of Anesthesia:  General  ?  ?Additional requests/questions:   pt needs to schedule procedure asap if they can also get clearance asap ? ?Signed, ?[place]   ?11/09/2021, 3:50 PM   ?

## 2021-11-10 NOTE — Telephone Encounter (Signed)
? ?  Name: Morgan [name]  ?DOB: 03-02-1951  ?MRN: [id number] ? ?Primary Cardiologist: Sanda Klein, MD ? ?Chart reviewed as part of pre-operative protocol coverage. Because of Luxe Feltus's past medical history and time since last visit, she will require a follow-up visit in order to better assess preoperative cardiovascular risk. ? ?Pre-op covering staff: ?- Please schedule appointment and call patient to inform them. If patient already had an upcoming appointment within acceptable timeframe, please add "pre-op clearance" to the appointment notes so provider is aware. ?- Please contact requesting surgeon's office via preferred method (i.e, phone, fax) to inform them of need for appointment prior to surgery. ? ?If applicable, this message will also be routed to pharmacy pool and/or primary cardiologist for input on holding anticoagulant/antiplatelet agent as requested below so that this information is available to the clearing provider at time of patient's appointment.  ? ?Patient has not been seen since 07/30/2019. ? ?Almyra Deforest, [place]  ?11/10/2021, 8:46 AM  ? ?

## 2021-11-10 NOTE — Telephone Encounter (Signed)
1st attempt to reach pt regarding surgical clearance and the need for an appointment.  Pt is scheduled 12/03/21, but surgeon's office asked for ASAP clearance.  Pt will need to move her appointment up if she wants to get the surgery sooner. ?

## 2021-11-10 NOTE — Telephone Encounter (Signed)
Patient has been scheduled to see Terie Purser, PA-C, 11/17/21, clearance will be addressed at that time. ? ?Will route back to the requesting surgeon's office to make them aware.  ?

## 2021-11-10 NOTE — Telephone Encounter (Signed)
Patient returned call, she is now schedule for 3/22 at the [facility] location with Laurann Montana.  ?

## 2021-11-13 ENCOUNTER — Other Ambulatory Visit: Payer: Self-pay | Admitting: Neurology

## 2021-11-13 DIAGNOSIS — G20 Parkinson's disease: Secondary | ICD-10-CM

## 2021-11-15 DIAGNOSIS — N136 Pyonephrosis: Secondary | ICD-10-CM | POA: Diagnosis not present

## 2021-11-15 DIAGNOSIS — G9341 Metabolic encephalopathy: Secondary | ICD-10-CM | POA: Diagnosis not present

## 2021-11-15 DIAGNOSIS — N390 Urinary tract infection, site not specified: Secondary | ICD-10-CM | POA: Diagnosis not present

## 2021-11-15 DIAGNOSIS — G609 Hereditary and idiopathic neuropathy, unspecified: Secondary | ICD-10-CM | POA: Diagnosis not present

## 2021-11-15 DIAGNOSIS — G20 Parkinson's disease: Secondary | ICD-10-CM | POA: Diagnosis not present

## 2021-11-15 DIAGNOSIS — I119 Hypertensive heart disease without heart failure: Secondary | ICD-10-CM | POA: Diagnosis not present

## 2021-11-16 ENCOUNTER — Other Ambulatory Visit: Payer: Self-pay

## 2021-11-16 DIAGNOSIS — N390 Urinary tract infection, site not specified: Secondary | ICD-10-CM | POA: Diagnosis not present

## 2021-11-16 DIAGNOSIS — N136 Pyonephrosis: Secondary | ICD-10-CM | POA: Diagnosis not present

## 2021-11-16 DIAGNOSIS — G20 Parkinson's disease: Secondary | ICD-10-CM | POA: Diagnosis not present

## 2021-11-16 DIAGNOSIS — G609 Hereditary and idiopathic neuropathy, unspecified: Secondary | ICD-10-CM | POA: Diagnosis not present

## 2021-11-16 DIAGNOSIS — I119 Hypertensive heart disease without heart failure: Secondary | ICD-10-CM | POA: Diagnosis not present

## 2021-11-16 DIAGNOSIS — N201 Calculus of ureter: Secondary | ICD-10-CM | POA: Diagnosis not present

## 2021-11-16 DIAGNOSIS — G9341 Metabolic encephalopathy: Secondary | ICD-10-CM | POA: Diagnosis not present

## 2021-11-17 ENCOUNTER — Encounter (HOSPITAL_BASED_OUTPATIENT_CLINIC_OR_DEPARTMENT_OTHER): Payer: Self-pay | Admitting: Family

## 2021-11-17 ENCOUNTER — Other Ambulatory Visit: Payer: Self-pay

## 2021-11-17 ENCOUNTER — Ambulatory Visit (INDEPENDENT_AMBULATORY_CARE_PROVIDER_SITE_OTHER): Payer: Medicare Other | Admitting: Family

## 2021-11-17 VITALS — BP 136/82 | HR 86 | Ht 65.0 in | Wt 157.2 lb

## 2021-11-17 DIAGNOSIS — I493 Ventricular premature depolarization: Secondary | ICD-10-CM | POA: Diagnosis not present

## 2021-11-17 DIAGNOSIS — I447 Left bundle-branch block, unspecified: Secondary | ICD-10-CM

## 2021-11-17 DIAGNOSIS — Z01810 Encounter for preprocedural cardiovascular examination: Secondary | ICD-10-CM | POA: Diagnosis not present

## 2021-11-17 MED ORDER — LISINOPRIL 5 MG PO TABS: 5.0000 mg | ORAL_TABLET | ORAL | 2 refills | Status: DC | PRN

## 2021-11-17 MED ORDER — LISINOPRIL 10 MG PO TABS
10.0000 mg | ORAL_TABLET | Freq: Every day | ORAL | 2 refills | Status: DC
Start: 2021-11-17 — End: 2022-01-31

## 2021-11-17 NOTE — Patient Instructions (Addendum)
Medication Instructions:  ? ?Continue your current medications.  ? ?*If you need a refill on your cardiac medications before your next appointment, please call your pharmacy* ? ? ?Lab Work: ?None ordered today.  ? ?Testing/Procedures: ?Your EKG today was stable compared to previous.  ? ?Follow-Up: ?At [facility], you and your health needs are our priority.  As part of our continuing mission to provide you with exceptional heart care, we have created designated Provider Care Teams.  These Care Teams include your primary Cardiologist (physician) and Advanced Practice Providers (APPs -  Physician Assistants and Nurse Practitioners) who all work together to provide you with the care you need, when you need it. ? ?We recommend signing up for the patient portal called "MyChart".  Sign up information is provided on this After Visit Summary.  MyChart is used to connect with patients for Virtual Visits (Telemedicine).  Patients are able to view lab/test results, encounter notes, upcoming appointments, etc.  Non-urgent messages can be sent to your provider as well.   ?To learn more about what you can do with MyChart, go to [email or web address].   ? ?Your next appointment:   ?1 year ? ?The format for your next appointment:   ?In Person ? ?Provider:   ?Sanda Klein, MD   ? ? ?Other Instructions ? ?If blood pressure consistently more than 130/80 or less than 110/60, please contact the office. ?If blood pressure consistently low will decrease or discontinue Lisinopril. ?If urology resumes Flomax, reduce Lisinopril to '5mg'$ . ? ?Tips to Measure your Blood Pressure Correctly ? ?Here's what you can do to ensure a correct reading: ? Don't drink a caffeinated beverage or smoke during the 30 minutes before the test. ? Sit quietly for five minutes before the test begins. ? During the measurement, sit in a chair with your feet on the floor and your arm supported so your elbow is at about heart level. ? The inflatable part of the  cuff should completely cover at least 80% of your upper arm, and the cuff should be placed on bare skin, not over a shirt. ? Don't talk during the measurement. ? Have your blood pressure measured twice, with a brief break in between. If the readings are different by 5 points or more, have it done a third time. ? ?In 2017, new guidelines from the [place], the [employer] of Cardiology, and nine other health organizations lowered the diagnosis of high blood pressure to 130/80 mm Hg or higher for all adults. The guidelines also redefined the various blood pressure categories to now include normal, elevated, Stage 1 hypertension, Stage 2 hypertension, and hypertensive crisis (see "Blood pressure categories"). ? ?Blood pressure categories  ?Blood pressure category SYSTOLIC ?(upper number)  DIASTOLIC ?(lower number)  ?Normal Less than 120 mm Hg and Less than 80 mm Hg  ?Elevated 120-129 mm Hg and Less than 80 mm Hg  ?High blood pressure: Stage 1 hypertension 130-139 mm Hg or 80-89 mm Hg  ?High blood pressure: Stage 2 hypertension 140 mm Hg or higher or 90 mm Hg or higher  ?Hypertensive crisis (consult your doctor immediately) Higher than 180 mm Hg and/or Higher than 120 mm Hg  ?Source: American Heart Association and American Stroke Association. ?For more on getting your blood pressure under control, buy Controlling Your Blood Pressure, a Special Health Report from [facility]. ? ? ?Blood Pressure Log ? ? ?Date ?  ?Time  ?Blood Pressure  ?Example: Nov 1 9 AM 124/78  ? ?    ? ?    ? ?    ? ?    ? ?    ? ?    ? ?    ? ?    ? ? ?   ?

## 2021-11-17 NOTE — Progress Notes (Signed)
? ?Office Visit  ?  ?Patient Name: Morgan [name] ?Date of Encounter: 11/17/2021 ? ?PCP:  [name], MD ?  ?[place]  ?Cardiologist:  Sanda Klein, MD ?Advanced Practice Provider:  No care team member to display ?Electrophysiologist:  None  ?   ? ?Chief Complaint  ?  ?Morgan [name] is a 71 y.o. female with a hx of Parkinson disease, left bundle branch block, hypertension, hyperlipidemia, mitral regurgitation  presents today for preoperative cardiovascular clearance ? ?Past Medical History  ?  ?Past Medical History:  ?Diagnosis Date  ? Anxiety   ? Fibromyalgia   ? Hypercholesteremia   ? controlled on medication  ? Hyperlipidemia   ? Hypertension   ? no current meds  ? LBBB (left bundle branch block)   ? Melanoma ([place])   ? MVP (mitral valve prolapse)   ? Osteopenia   ? Parkinson disease ([place])   ? SVD (spontaneous vaginal delivery)   ? x 2  ? ?Past Surgical History:  ?Procedure Laterality Date  ? BLEPHAROPLASTY    ? cold knife conization    ? COLONOSCOPY    ? FH colon CA  ? CYSTOSCOPY W/ URETERAL STENT PLACEMENT Left 10/18/2021  ? Procedure: CYSTOSCOPY WITH RETROGRADE PYELOGRAM, URETERAL STENT PLACEMENT;  Surgeon: Festus Aloe, MD;  Location: [facility];  Service: Urology;  Laterality: Left;  ? DILATATION & CURETTAGE/HYSTEROSCOPY WITH MYOSURE N/A 02/14/2018  ? Procedure: ATTEMPTED DILATATION & CURETTAGE/HYSTEROSCOPY WITH MYOSURE;  Surgeon: Christophe Louis, MD;  Location: [facility];  Service: Gynecology;  Laterality: N/A;  polyp  ? melanoma cancer    ? removed from left leg  ? TONSILLECTOMY    ? TUBAL LIGATION    ? WISDOM TOOTH EXTRACTION    ? ? ?Allergies ? ?Allergies  ?Allergen Reactions  ? Other Anaphylaxis  ?  "Anti Seizure Medications"  ? Phenergan [Promethazine Hcl] Other (See Comments)  ?  Makes Parkinson worse  ? Sporanox [Itraconazole] Hives  ? Tylenol [Acetaminophen] Other (See Comments)  ?  "I just get weird"  ? ? ?History of Present Illness  ?  ?Morgan [name] is a 71 y.o. female  with a hx of Parkinson disease, left bundle branch block, hypertension, hyperlipidemia, mitral regurgitation last seen 07/30/2019 by Dr. Sallyanne Kuster. ? ?When last seen 07/30/2019 she noted occasional difficulties with orthostatic hypotension in the setting of Parkinson's but this was infrequent and she was using a Nohealani Medinger to avoid falls.  Her work-up revealed no evidence of structural heart disease.  She was recommended for follow-up in 1 year for monitoring of left bundle branch block. ? ?Admission 09/2021 with severe sepsis due to E. coli UTI bacteremia.  She underwent emergent successful left ureteral stent placement 10/18/2021.  She was treated with Rocephin.  Echocardiogram during admission revealed normal LVEF 60 to 65%, no RWMA, indeterminate diastolic parameters, elevated LVEDP, RV normal size and function, mild to moderate MR.  She was recommended for home health PT/OT. ? ?She presents today for preoperative cardiovascular clearance for cystoscopic volume laser lithotripsy stent placement with [facility].  Her husband assists with providing additional information to his recent blood pressure readings.  Blood pressure has been labile 120s to 130s at home.  She was having hypotensive episodes when she was taking lisinopril and Flomax together but Flomax since been discontinued with no recurrent hypotension. Reports no shortness of breath at rest and dyspnea on exertion which is improving with [facility] PT/OT. Reports no chest pain, pressure, or tightness. No edema, orthopnea, PND.  Reports no palpitations.   ? ? ?EKGs/Labs/Other Studies Reviewed:  ? ?The following studies were reviewed today: ? ?Echo 09/2021 ? 1. Left ventricular ejection fraction, by estimation, is 60 to 65%. The  ?left ventricle has normal function. The left ventricle has no regional  ?wall motion abnormalities. Left ventricular diastolic parameters are  ?indeterminate. Elevated left ventricular  ?end-diastolic pressure.  ? 2. Right ventricular  systolic function is normal. The right ventricular  ?size is normal.  ? 3. The mitral valve is grossly normal. Mild to moderate mitral valve  ?regurgitation.  ? 4. The aortic valve is normal in structure. Aortic valve regurgitation is  ?not visualized. No aortic stenosis is present.  ? ?EKG:  EKG is ordered today.  The ekg ordered today demonstrates NSR 85 bpm with LBBB. No acute ST/T wave changes.  ? ?Recent Labs: ?10/18/2021: ALT 7; BUN 11; Creatinine, Ser 0.67; Magnesium 1.8; Sodium 135; TSH 1.400 ?10/19/2021: Potassium 4.1 ?10/20/2021: Hemoglobin 10.9; Platelets 243  ?Recent Lipid Panel ?   ?Component Value Date/Time  ? CHOL  09/04/2008 0105  ?  155        ?ATP III CLASSIFICATION: ? <200     mg/dL   Desirable ? 200-239  mg/dL   Borderline High ? >=240    mg/dL   High ?        ? TRIG 175 (H) 09/04/2008 0105  ? HDL 40 09/04/2008 0105  ? CHOLHDL 3.9 09/04/2008 0105  ? VLDL 35 09/04/2008 0105  ? [place]  09/04/2008 0105  ?  80        ?Total Cholesterol/HDL:CHD Risk ?Coronary Heart Disease Risk Table ?                    Men   Women ? 1/2 Average Risk   3.4   3.3 ? Average Risk       5.0   4.4 ? 2 X Average Risk   9.6   7.1 ? 3 X Average Risk  23.4   11.0 ?       ?Use the calculated Patient Ratio ?above and the CHD Risk Table ?to determine the patient's CHD Risk. ?       ?ATP III CLASSIFICATION (LDL): ? <100     mg/dL   Optimal ? 100-129  mg/dL   Near or Above ?                   Optimal ? 130-159  mg/dL   Borderline ? 160-189  mg/dL   High ? >190     mg/dL   Very High  ? ? ?Home Medications  ? ?Current Meds  ?Medication Sig  ? ALPRAZolam (XANAX) 0.25 MG tablet Take 0.25 mg by mouth daily as needed for anxiety.   ? aspirin 325 MG tablet Take 325-650 mg by mouth daily as needed (for headaches/pain.).   ? atorvastatin (LIPITOR) 10 MG tablet Take 10 mg by mouth in the morning.  ? carbidopa-levodopa (SINEMET IR) 25-100 MG tablet TAKE 1 TABLET EVERY 2 HOURS UP TO 8 TABLETS PER DAY  ? cephALEXin (KEFLEX) 250 MG capsule Take  250 mg by mouth 2 (two) times daily.  ? Cholecalciferol (VITAMIN D-3) 5000 units TABS Take 5,000 Units by mouth 2 (two) times daily.  ? Coenzyme Q10-Vitamin E (QUNOL ULTRA COQ10 PO) Take 1 capsule by mouth daily.  ? CYMBALTA 60 MG capsule TAKE 1 CAPSULE DAILY  ? gabapentin (NEURONTIN) 400 MG capsule TAKE 1 CAPSULE  3 TIMES A   DAY  ? lisinopril (ZESTRIL) 5 MG tablet Take 1 tablet (5 mg total) by mouth as needed (If systolic blood pressure less than 110, hold Lisinopril '10mg'$  and take Lisinopril '5mg'$ ). If systolic blood pressure less than 110, hold Lisinopril '10mg'$  and take Lisinopril '5mg'$   ? methocarbamol (ROBAXIN) 750 MG tablet Take 750 mg by mouth every 6 (six) hours as needed for muscle spasms.  ? Multiple Vitamins-Minerals (PRESERVISION AREDS 2 PO) Take 1 tablet by mouth 2 (two) times daily.  ? raloxifene (EVISTA) 60 MG tablet Take 60 mg by mouth daily.  ? ramelteon (ROZEREM) 8 MG tablet TAKE 1 TABLET AT BEDTIME  ? [DISCONTINUED] lisinopril (ZESTRIL) 10 MG tablet Take 1 tablet (10 mg total) by mouth daily.  ?  ? ?Review of Systems  ?    ?All other systems reviewed and are otherwise negative except as noted above. ? ?Physical Exam  ?  ?VS:  BP 136/82   Pulse 86   Ht '5\' 5"'$  (1.651 m)   Wt 157 lb 3.2 oz (71.3 kg)   SpO2 98%   BMI 26.16 kg/m?  , BMI Body mass index is 26.16 kg/m?. ? ?Wt Readings from Last 3 Encounters:  ?11/17/21 157 lb 3.2 oz (71.3 kg)  ?10/18/21 175 lb 0.7 oz (79.4 kg)  ?09/24/21 161 lb (73 kg)  ?  ? ?GEN: Well nourished, well developed, in no acute distress. ?HEENT: normal. ?Neck: Supple, no JVD, carotid bruits, or masses. ?Cardiac: RRR, no murmurs, rubs, or gallops. No clubbing, cyanosis, edema.  Radials/PT 2+ and equal bilaterally.  ?Respiratory:  Respirations regular and unlabored, clear to auscultation bilaterally. ?GI: Soft, nontender, nondistended. ?MS: No deformity or atrophy. ?Skin: Warm and dry, no rash. ?Neuro:  Strength and sensation are intact. ?Psych: Normal affect. ? ?Assessment &  Plan  ?  ?Preoperative cardiovascular clearance - Upcoming uretoscopy and stent exchange. According to the Revised Cardiac Risk Index (RCRI), her Perioperative Risk of Major Cardiac Event is (%): 0.4.  Exercise to

## 2021-11-21 DIAGNOSIS — Z9181 History of falling: Secondary | ICD-10-CM | POA: Diagnosis not present

## 2021-11-21 DIAGNOSIS — F419 Anxiety disorder, unspecified: Secondary | ICD-10-CM | POA: Diagnosis not present

## 2021-11-21 DIAGNOSIS — I119 Hypertensive heart disease without heart failure: Secondary | ICD-10-CM | POA: Diagnosis not present

## 2021-11-21 DIAGNOSIS — G9341 Metabolic encephalopathy: Secondary | ICD-10-CM | POA: Diagnosis not present

## 2021-11-21 DIAGNOSIS — E7800 Pure hypercholesterolemia, unspecified: Secondary | ICD-10-CM | POA: Diagnosis not present

## 2021-11-21 DIAGNOSIS — Z9851 Tubal ligation status: Secondary | ICD-10-CM | POA: Diagnosis not present

## 2021-11-21 DIAGNOSIS — M797 Fibromyalgia: Secondary | ICD-10-CM | POA: Diagnosis not present

## 2021-11-21 DIAGNOSIS — N136 Pyonephrosis: Secondary | ICD-10-CM | POA: Diagnosis not present

## 2021-11-21 DIAGNOSIS — Z87891 Personal history of nicotine dependence: Secondary | ICD-10-CM | POA: Diagnosis not present

## 2021-11-21 DIAGNOSIS — K08409 Partial loss of teeth, unspecified cause, unspecified class: Secondary | ICD-10-CM | POA: Diagnosis not present

## 2021-11-21 DIAGNOSIS — N390 Urinary tract infection, site not specified: Secondary | ICD-10-CM | POA: Diagnosis not present

## 2021-11-21 DIAGNOSIS — K8020 Calculus of gallbladder without cholecystitis without obstruction: Secondary | ICD-10-CM | POA: Diagnosis not present

## 2021-11-21 DIAGNOSIS — Z792 Long term (current) use of antibiotics: Secondary | ICD-10-CM | POA: Diagnosis not present

## 2021-11-21 DIAGNOSIS — Z7982 Long term (current) use of aspirin: Secondary | ICD-10-CM | POA: Diagnosis not present

## 2021-11-21 DIAGNOSIS — I341 Nonrheumatic mitral (valve) prolapse: Secondary | ICD-10-CM | POA: Diagnosis not present

## 2021-11-21 DIAGNOSIS — I493 Ventricular premature depolarization: Secondary | ICD-10-CM | POA: Diagnosis not present

## 2021-11-21 DIAGNOSIS — C439 Malignant melanoma of skin, unspecified: Secondary | ICD-10-CM | POA: Diagnosis not present

## 2021-11-21 DIAGNOSIS — I447 Left bundle-branch block, unspecified: Secondary | ICD-10-CM | POA: Diagnosis not present

## 2021-11-21 DIAGNOSIS — M8580 Other specified disorders of bone density and structure, unspecified site: Secondary | ICD-10-CM | POA: Diagnosis not present

## 2021-11-21 DIAGNOSIS — G20 Parkinson's disease: Secondary | ICD-10-CM | POA: Diagnosis not present

## 2021-11-21 DIAGNOSIS — Z960 Presence of urogenital implants: Secondary | ICD-10-CM | POA: Diagnosis not present

## 2021-11-21 DIAGNOSIS — G609 Hereditary and idiopathic neuropathy, unspecified: Secondary | ICD-10-CM | POA: Diagnosis not present

## 2021-11-22 DIAGNOSIS — G20 Parkinson's disease: Secondary | ICD-10-CM | POA: Diagnosis not present

## 2021-11-22 DIAGNOSIS — I119 Hypertensive heart disease without heart failure: Secondary | ICD-10-CM | POA: Diagnosis not present

## 2021-11-22 DIAGNOSIS — N390 Urinary tract infection, site not specified: Secondary | ICD-10-CM | POA: Diagnosis not present

## 2021-11-22 DIAGNOSIS — N136 Pyonephrosis: Secondary | ICD-10-CM | POA: Diagnosis not present

## 2021-11-22 DIAGNOSIS — G609 Hereditary and idiopathic neuropathy, unspecified: Secondary | ICD-10-CM | POA: Diagnosis not present

## 2021-11-22 DIAGNOSIS — G9341 Metabolic encephalopathy: Secondary | ICD-10-CM | POA: Diagnosis not present

## 2021-11-24 DIAGNOSIS — N136 Pyonephrosis: Secondary | ICD-10-CM | POA: Diagnosis not present

## 2021-11-24 DIAGNOSIS — G609 Hereditary and idiopathic neuropathy, unspecified: Secondary | ICD-10-CM | POA: Diagnosis not present

## 2021-11-24 DIAGNOSIS — I119 Hypertensive heart disease without heart failure: Secondary | ICD-10-CM | POA: Diagnosis not present

## 2021-11-24 DIAGNOSIS — G9341 Metabolic encephalopathy: Secondary | ICD-10-CM | POA: Diagnosis not present

## 2021-11-24 DIAGNOSIS — N390 Urinary tract infection, site not specified: Secondary | ICD-10-CM | POA: Diagnosis not present

## 2021-11-24 DIAGNOSIS — G20 Parkinson's disease: Secondary | ICD-10-CM | POA: Diagnosis not present

## 2021-11-29 ENCOUNTER — Encounter: Payer: Self-pay | Admitting: Neurology

## 2021-11-29 DIAGNOSIS — G609 Hereditary and idiopathic neuropathy, unspecified: Secondary | ICD-10-CM | POA: Diagnosis not present

## 2021-11-29 DIAGNOSIS — I119 Hypertensive heart disease without heart failure: Secondary | ICD-10-CM | POA: Diagnosis not present

## 2021-11-29 DIAGNOSIS — N390 Urinary tract infection, site not specified: Secondary | ICD-10-CM | POA: Diagnosis not present

## 2021-11-29 DIAGNOSIS — F419 Anxiety disorder, unspecified: Secondary | ICD-10-CM | POA: Diagnosis not present

## 2021-11-29 DIAGNOSIS — G20 Parkinson's disease: Secondary | ICD-10-CM | POA: Diagnosis not present

## 2021-11-29 DIAGNOSIS — I10 Essential (primary) hypertension: Secondary | ICD-10-CM | POA: Diagnosis not present

## 2021-11-29 DIAGNOSIS — Z1389 Encounter for screening for other disorder: Secondary | ICD-10-CM | POA: Diagnosis not present

## 2021-11-29 DIAGNOSIS — E785 Hyperlipidemia, unspecified: Secondary | ICD-10-CM | POA: Diagnosis not present

## 2021-11-29 DIAGNOSIS — Z0000 Encounter for general adult medical examination without abnormal findings: Secondary | ICD-10-CM | POA: Diagnosis not present

## 2021-11-29 DIAGNOSIS — N136 Pyonephrosis: Secondary | ICD-10-CM | POA: Diagnosis not present

## 2021-11-29 DIAGNOSIS — M797 Fibromyalgia: Secondary | ICD-10-CM | POA: Diagnosis not present

## 2021-11-29 DIAGNOSIS — G9341 Metabolic encephalopathy: Secondary | ICD-10-CM | POA: Diagnosis not present

## 2021-12-01 DIAGNOSIS — N390 Urinary tract infection, site not specified: Secondary | ICD-10-CM | POA: Diagnosis not present

## 2021-12-01 DIAGNOSIS — I119 Hypertensive heart disease without heart failure: Secondary | ICD-10-CM | POA: Diagnosis not present

## 2021-12-01 DIAGNOSIS — G609 Hereditary and idiopathic neuropathy, unspecified: Secondary | ICD-10-CM | POA: Diagnosis not present

## 2021-12-01 DIAGNOSIS — G9341 Metabolic encephalopathy: Secondary | ICD-10-CM | POA: Diagnosis not present

## 2021-12-01 DIAGNOSIS — N136 Pyonephrosis: Secondary | ICD-10-CM | POA: Diagnosis not present

## 2021-12-01 DIAGNOSIS — G20 Parkinson's disease: Secondary | ICD-10-CM | POA: Diagnosis not present

## 2021-12-03 ENCOUNTER — Ambulatory Visit: Payer: Medicare Other | Admitting: Cardiovascular Disease

## 2021-12-06 DIAGNOSIS — G9341 Metabolic encephalopathy: Secondary | ICD-10-CM | POA: Diagnosis not present

## 2021-12-06 DIAGNOSIS — N136 Pyonephrosis: Secondary | ICD-10-CM | POA: Diagnosis not present

## 2021-12-06 DIAGNOSIS — G20 Parkinson's disease: Secondary | ICD-10-CM | POA: Diagnosis not present

## 2021-12-06 DIAGNOSIS — I119 Hypertensive heart disease without heart failure: Secondary | ICD-10-CM | POA: Diagnosis not present

## 2021-12-06 DIAGNOSIS — G609 Hereditary and idiopathic neuropathy, unspecified: Secondary | ICD-10-CM | POA: Diagnosis not present

## 2021-12-06 DIAGNOSIS — N390 Urinary tract infection, site not specified: Secondary | ICD-10-CM | POA: Diagnosis not present

## 2021-12-08 DIAGNOSIS — G609 Hereditary and idiopathic neuropathy, unspecified: Secondary | ICD-10-CM | POA: Diagnosis not present

## 2021-12-08 DIAGNOSIS — G20 Parkinson's disease: Secondary | ICD-10-CM | POA: Diagnosis not present

## 2021-12-08 DIAGNOSIS — N390 Urinary tract infection, site not specified: Secondary | ICD-10-CM | POA: Diagnosis not present

## 2021-12-08 DIAGNOSIS — G9341 Metabolic encephalopathy: Secondary | ICD-10-CM | POA: Diagnosis not present

## 2021-12-08 DIAGNOSIS — N136 Pyonephrosis: Secondary | ICD-10-CM | POA: Diagnosis not present

## 2021-12-08 DIAGNOSIS — I119 Hypertensive heart disease without heart failure: Secondary | ICD-10-CM | POA: Diagnosis not present

## 2021-12-09 ENCOUNTER — Other Ambulatory Visit: Payer: Self-pay | Admitting: Urology

## 2021-12-09 NOTE — Progress Notes (Signed)
Surgery orders requested with Coni at Dr. Lyndal Rainbow office. ?

## 2021-12-13 DIAGNOSIS — I119 Hypertensive heart disease without heart failure: Secondary | ICD-10-CM | POA: Diagnosis not present

## 2021-12-13 DIAGNOSIS — N136 Pyonephrosis: Secondary | ICD-10-CM | POA: Diagnosis not present

## 2021-12-13 DIAGNOSIS — N390 Urinary tract infection, site not specified: Secondary | ICD-10-CM | POA: Diagnosis not present

## 2021-12-13 DIAGNOSIS — G9341 Metabolic encephalopathy: Secondary | ICD-10-CM | POA: Diagnosis not present

## 2021-12-13 DIAGNOSIS — G20 Parkinson's disease: Secondary | ICD-10-CM | POA: Diagnosis not present

## 2021-12-13 DIAGNOSIS — G609 Hereditary and idiopathic neuropathy, unspecified: Secondary | ICD-10-CM | POA: Diagnosis not present

## 2021-12-13 NOTE — Progress Notes (Addendum)
COVID Vaccine Completed: yes x2 ?Date COVID Vaccine completed: 09/19/19, 10/10/19 ?Has received booster: ?COVID vaccine manufacturer: Pfizer     ? ?Date of COVID positive in last 90 days: no ? ?PCP - [name], MD ?Cardiologist - Sanda Klein, MD ? ?Cardiac clearance 11/17/21 by Laurann Montana in [place] ? ?Chest x-ray - CT 10/17/21 Epic ?EKG - 11/17/21 Epic ?Stress Test - n/a ?ECHO - 10/18/21 Epic ?Cardiac Cath - n/a ?Pacemaker/ICD device last checked: n/a ?Spinal Cord Stimulator: n/a ? ?Bowel Prep - no ? ?Sleep Study - n/a ?CPAP -  ? ?Fasting Blood Sugar - n/a ?Checks Blood Sugar _____ times a day ? ?Blood Thinner Instructions: ?Aspirin Instructions: ASA 325, hold 7 days before ?Last Dose: ? ?Activity level: Can go up a flight of stairs and perform activities of daily living without stopping and without symptoms of chest pain or shortness of breath. ?    ? ?Anesthesia review: LBBB, HTN, PVCs, Parkinson, post  to tooth in front ? ?Patient denies shortness of breath, fever, cough and chest pain at PAT appointment ? ? ?Patient verbalized understanding of instructions that were given to them at the PAT appointment. Patient was also instructed that they will need to review over the PAT instructions again at home before surgery.  ?

## 2021-12-13 NOTE — Patient Instructions (Addendum)
DUE TO COVID-19 ONLY ONE VISITOR  (aged 73 and older)  IS ALLOWED TO COME WITH YOU AND STAY IN THE WAITING ROOM ONLY DURING PRE OP AND PROCEDURE.   ?**NO VISITORS ARE ALLOWED IN THE SHORT STAY AREA OR RECOVERY ROOM!!** ? ? Your procedure is scheduled on: 12/24/21 ? ? Report to [facility] Main Entrance ? ?  Report to admitting at 7:30 AM ? ? Call this number if you have problems the morning of surgery [phone] ? ? Do not eat food :After Midnight. ? ? After Midnight you may have the following liquids until 6:45 AM DAY OF SURGERY ? ?Water ?Black Coffee (sugar ok, NO MILK/CREAM OR CREAMERS)  ?Tea (sugar ok, NO MILK/CREAM OR CREAMERS) regular and decaf                             ?Plain Jell-O (NO RED)                                           ?Fruit ices (not with fruit pulp, NO RED)                                     ?Popsicles (NO RED)                                                                  ?Juice: apple, WHITE grape, WHITE cranberry ?Sports drinks like Gatorade (NO RED) ?Clear broth(vegetable,chicken,beef) ? ?FOLLOW BOWEL PREP AND ANY ADDITIONAL PRE OP INSTRUCTIONS YOU RECEIVED FROM YOUR SURGEON'S OFFICE!!! ?  ?  ?Oral Hygiene is also important to reduce your risk of infection.                                    ?Remember - BRUSH YOUR TEETH THE MORNING OF SURGERY WITH YOUR REGULAR TOOTHPASTE ? ? ? Take these medicines the morning of surgery with A SIP OF WATER: Xanax, Carbidopa-Levodopa, Keflex, Cymbalta, Gabapentin. ?                  ?           You may not have any metal on your body including hair pins, jewelry, and body piercing ? ?           Do not wear make-up, lotions, powders, perfumes, or deodorant ? ?Do not wear nail polish including gel and S&S, artificial/acrylic nails, or any other type of covering on natural nails including finger and toenails. If you have artificial nails, gel coating, etc. that needs to be removed by a nail salon please have this removed prior to surgery or  surgery may need to be canceled/ delayed if the surgeon/ anesthesia feels like they are unable to be safely monitored.  ? ?Do not shave  48 hours prior to surgery.  ? ? Do not bring valuables to the hospital. [facility] NOT ?  RESPONSIBLE   FOR VALUABLES. ?  ? Patients discharged on the day of surgery will not be allowed to drive home.  Someone NEEDS to stay with you for the first 24 hours after anesthesia. ? ? Special Instructions: Bring a copy of your healthcare power of attorney and living will documents         the day of surgery if you haven't scanned them before. ? ?            Please read over the following fact sheets you were given: IF [place] [phone]- Apolonio Schneiders ? ?   [facility] - Preparing for Surgery ?Before surgery, you can play an important role.  Because skin is not sterile, your skin needs to be as free of germs as possible.  You can reduce the number of germs on your skin by washing with CHG (chlorahexidine gluconate) soap before surgery.  CHG is an antiseptic cleaner which kills germs and bonds with the skin to continue killing germs even after washing. ?Please DO NOT use if you have an allergy to CHG or antibacterial soaps.  If your skin becomes reddened/irritated stop using the CHG and inform your nurse when you arrive at Short Stay. ?Do not shave (including legs and underarms) for at least 48 hours prior to the first CHG shower.  You may shave your face/neck. ? ?Please follow these instructions carefully: ? 1.  Shower with CHG Soap the night before surgery and the  morning of surgery. ? 2.  If you choose to wash your hair, wash your hair first as usual with your normal  shampoo. ? 3.  After you shampoo, rinse your hair and body thoroughly to remove the shampoo.                            ? 4.  Use CHG as you would any other liquid soap.  You can apply chg directly to the skin and wash.  Gently with a scrungie or clean  washcloth. ? 5.  Apply the CHG Soap to your body ONLY FROM THE NECK DOWN.   Do   not use on face/ open      ?                     Wound or open sores. Avoid contact with eyes, ears mouth and   genitals (private parts).  ?                     Production manager,  Genitals (private parts) with your normal soap. ?            6.  Wash thoroughly, paying special attention to the area where your    surgery  will be performed. ? 7.  Thoroughly rinse your body with warm water from the neck down. ? 8.  DO NOT shower/wash with your normal soap after using and rinsing off the CHG Soap. ?               9.  Pat yourself dry with a clean towel. ?           10.  Wear clean pajamas. ?           11.  Place clean sheets on your bed the night of your first shower and do not  sleep with pets. ?Day of Surgery : ?Do not  apply any lotions/deodorants the morning of surgery.  Please wear clean clothes to the hospital/surgery center. ? ?FAILURE TO FOLLOW THESE INSTRUCTIONS MAY RESULT IN THE CANCELLATION OF YOUR SURGERY ? ?PATIENT SIGNATURE_________________________________ ? ?NURSE SIGNATURE__________________________________ ? ?________________________________________________________________________  ?

## 2021-12-14 ENCOUNTER — Encounter (HOSPITAL_COMMUNITY): Payer: Self-pay

## 2021-12-14 ENCOUNTER — Encounter (HOSPITAL_COMMUNITY)
Admission: RE | Admit: 2021-12-14 | Discharge: 2021-12-14 | Disposition: A | Discharge status: Home / Self Care | Payer: Medicare Other | Source: Ambulatory Visit | Attending: Urology | Admitting: Urology

## 2021-12-14 VITALS — BP 132/88 | HR 91 | Temp 98.2°F | Resp 16 | Ht 65.0 in | Wt 156.0 lb

## 2021-12-14 DIAGNOSIS — I447 Left bundle-branch block, unspecified: Secondary | ICD-10-CM | POA: Insufficient documentation

## 2021-12-14 DIAGNOSIS — N201 Calculus of ureter: Secondary | ICD-10-CM | POA: Insufficient documentation

## 2021-12-14 DIAGNOSIS — Z87891 Personal history of nicotine dependence: Secondary | ICD-10-CM | POA: Diagnosis not present

## 2021-12-14 DIAGNOSIS — Z01812 Encounter for preprocedural laboratory examination: Secondary | ICD-10-CM | POA: Diagnosis not present

## 2021-12-14 DIAGNOSIS — G20 Parkinson's disease: Secondary | ICD-10-CM | POA: Diagnosis not present

## 2021-12-14 DIAGNOSIS — I10 Essential (primary) hypertension: Secondary | ICD-10-CM | POA: Diagnosis not present

## 2021-12-14 HISTORY — DX: Headache, unspecified: R51.9

## 2021-12-14 HISTORY — DX: Pneumonia, unspecified organism: J18.9

## 2021-12-14 LAB — CBC
HCT: 39.8 % (ref 36.0–46.0)
Hemoglobin: 12.7 g/dL (ref 12.0–15.0)
MCH: 28.7 pg (ref 26.0–34.0)
MCHC: 31.9 g/dL (ref 30.0–36.0)
MCV: 89.8 fL (ref 80.0–100.0)
Platelets: 252 10*3/uL (ref 150–400)
RBC: 4.43 MIL/uL (ref 3.87–5.11)
RDW: 13.3 % (ref 11.5–15.5)
WBC: 7.5 10*3/uL (ref 4.0–10.5)
nRBC: 0 % (ref 0.0–0.2)

## 2021-12-14 LAB — BASIC METABOLIC PANEL
Anion gap: 5 (ref 5–15)
BUN: 18 mg/dL (ref 8–23)
CO2: 30 mmol/L (ref 22–32)
Calcium: 8.9 mg/dL (ref 8.9–10.3)
Chloride: 104 mmol/L (ref 98–111)
Creatinine, Ser: 0.78 mg/dL (ref 0.44–1.00)
GFR, Estimated: >60 mL/min (ref >60)
Glucose, Bld: 96 mg/dL (ref 70–99)
Potassium: 3.6 mmol/L (ref 3.5–5.1)
Sodium: 139 mmol/L (ref 135–145)

## 2021-12-15 ENCOUNTER — Encounter (HOSPITAL_BASED_OUTPATIENT_CLINIC_OR_DEPARTMENT_OTHER): Payer: Self-pay | Admitting: Urology

## 2021-12-15 ENCOUNTER — Other Ambulatory Visit: Payer: Self-pay

## 2021-12-15 NOTE — Progress Notes (Addendum)
Spoke with patient's husband, [name], who states that patient has mobility impairment d/t Parkinsons and needs assistance with ADLs, including transferring from chair to chair. Husband states that he helps her with "everything" and that she uses walkers, cane, and transfer board. Husband also states that patient fell last week but did not sustain any injuries. Spoke Norris Cross, Surveyor, quantity who agrees that patient is not a candidate for surgery at [facility] d/t this. ?

## 2021-12-15 NOTE — Progress Notes (Signed)
Anesthesia Chart Review ? ? Case: [id number] Date/Time: 12/17/21 1015  ? Procedure: CYSTOSCOPY/ RETROGRADE/URETEROSCOPY/HOLMIUM LASER/STENT PLACEMENT (Left)  ? Anesthesia type: General  ? Pre-op diagnosis: LEFT URETERAL STONE  ? Location: [facility] ROOM 1 / [place]  ? Surgeons: Festus Aloe, MD  ? ?  ? ? ?DISCUSSION:71 y.o. former smoker with h/o Parkinson disease, HTN, LBBB, left ureteral stone scheduled for above procedure 12/24/2021 with Dr. Festus Aloe.  ? ?Pt last seen by cardiology 11/17/21. Per OV note, "Preoperative cardiovascular clearance - Upcoming uretoscopy and stent exchange. According to the Revised Cardiac Risk Index (RCRI), her Perioperative Risk of Major Cardiac Event is (%): 0.4.  Exercise tolerance greater than 4 METS.  She is deemed acceptable risk for the planned procedure without additional cardiovascular testing.  Will route to urology team so they are aware." ? ?Anticipate pt can proceed with planned procedure barring acute status change.   ?VS: BP 132/88   Pulse 91   Temp 36.8 ?C (Oral)   Resp 16   Ht '5\' 5"'$  (1.651 m)   Wt 70.8 kg   SpO2 96%   BMI 25.96 kg/m?  ? ?PROVIDERS: ?[name], MD is PCP  ? ?Cardiologist:  Sanda Klein, MD ?LABS: Labs reviewed: Acceptable for surgery. ?(all labs ordered are listed, but only abnormal results are displayed) ? ?Labs Reviewed  ?BASIC METABOLIC PANEL  ?CBC  ? ? ? ?IMAGES: ? ? ?EKG: ?11/17/21 ?Rate 85 bpm  ?Stable LAFB ? ? ?CV: ?Echo 10/18/2021 ?1. Left ventricular ejection fraction, by estimation, is 60 to 65%. The  ?left ventricle has normal function. The left ventricle has no regional  ?wall motion abnormalities. Left ventricular diastolic parameters are  ?indeterminate. Elevated left ventricular  ?end-diastolic pressure.  ? 2. Right ventricular systolic function is normal. The right ventricular  ?size is normal.  ? 3. The mitral valve is grossly normal. Mild to moderate mitral valve  ?regurgitation.  ? 4. The aortic  valve is normal in structure. Aortic valve regurgitation is  ?not visualized. No aortic stenosis is present.  ?Past Medical History:  ?Diagnosis Date  ? Anxiety   ? Fibromyalgia   ? Headache   ? Hypercholesteremia   ? controlled on medication  ? Hyperlipidemia   ? Hypertension   ? no current meds  ? LBBB (left bundle branch block)   ? Melanoma ([place])   ? MVP (mitral valve prolapse)   ? Osteopenia   ? Parkinson disease ([place])   ? Pneumonia   ? SVD (spontaneous vaginal delivery)   ? x 2  ? ? ?Past Surgical History:  ?Procedure Laterality Date  ? BLEPHAROPLASTY    ? cold knife conization    ? COLONOSCOPY    ? FH colon CA  ? CYSTOSCOPY W/ URETERAL STENT PLACEMENT Left 10/18/2021  ? Procedure: CYSTOSCOPY WITH RETROGRADE PYELOGRAM, URETERAL STENT PLACEMENT;  Surgeon: Festus Aloe, MD;  Location: [facility];  Service: Urology;  Laterality: Left;  ? DILATATION & CURETTAGE/HYSTEROSCOPY WITH MYOSURE N/A 02/14/2018  ? Procedure: ATTEMPTED DILATATION & CURETTAGE/HYSTEROSCOPY WITH MYOSURE;  Surgeon: Christophe Louis, MD;  Location: [facility];  Service: Gynecology;  Laterality: N/A;  polyp  ? melanoma cancer    ? removed from left leg  ? RECONSTRUCTION OF EYELID    ? TONSILLECTOMY    ? TUBAL LIGATION    ? WISDOM TOOTH EXTRACTION    ? ? ?MEDICATIONS: ? ALPRAZolam (XANAX) 0.25 MG tablet  ? aspirin 325 MG tablet  ? atorvastatin (LIPITOR) 10 MG  tablet  ? carbidopa-levodopa (SINEMET IR) 25-100 MG tablet  ? cephALEXin (KEFLEX) 250 MG capsule  ? Cholecalciferol (VITAMIN D-3) 5000 units TABS  ? Coenzyme Q10-Vitamin E (QUNOL ULTRA COQ10 PO)  ? CYMBALTA 60 MG capsule  ? gabapentin (NEURONTIN) 400 MG capsule  ? lisinopril (ZESTRIL) 10 MG tablet  ? lisinopril (ZESTRIL) 5 MG tablet  ? methocarbamol (ROBAXIN) 750 MG tablet  ? Multiple Vitamins-Minerals (PRESERVISION AREDS 2 PO)  ? phenazopyridine (PYRIDIUM) 95 MG tablet  ? Polyethylene Glycol 400 (BLINK TEARS) 0.25 % SOLN  ? raloxifene (EVISTA) 60 MG tablet  ? ramelteon (ROZEREM) 8 MG tablet  ?  tamsulosin (FLOMAX) 0.4 MG CAPS capsule  ? ?No current facility-administered medications for this encounter.  ? ?Konrad Felix Ward, PA-C ?[facility] ?[phone] ? ? ? ? ? ? ? ?

## 2021-12-15 NOTE — Progress Notes (Signed)
Patient's husband called stating surgery date and time has been changed and wanted to know what time they need to be here. Called back and spoke with patient and informed her to beat [facility] at 11:30 on 12/17/21. Patient verbalized understanding and will call back with any further questions or concerns.  ?

## 2021-12-16 DIAGNOSIS — N390 Urinary tract infection, site not specified: Secondary | ICD-10-CM | POA: Diagnosis not present

## 2021-12-16 DIAGNOSIS — N136 Pyonephrosis: Secondary | ICD-10-CM | POA: Diagnosis not present

## 2021-12-16 DIAGNOSIS — I119 Hypertensive heart disease without heart failure: Secondary | ICD-10-CM | POA: Diagnosis not present

## 2021-12-16 DIAGNOSIS — G609 Hereditary and idiopathic neuropathy, unspecified: Secondary | ICD-10-CM | POA: Diagnosis not present

## 2021-12-16 DIAGNOSIS — G20 Parkinson's disease: Secondary | ICD-10-CM | POA: Diagnosis not present

## 2021-12-16 DIAGNOSIS — G9341 Metabolic encephalopathy: Secondary | ICD-10-CM | POA: Diagnosis not present

## 2021-12-16 NOTE — Progress Notes (Signed)
Due to Morgan [name]'s Parkinson's, spouse [name] states Morgan [name] requires Sinemet IR 25-'100mg'$  q2h while awake to be "able to move."  After consulting Dr Royetta Car, anesthesia,  and [facility], agree that anesthsia can order sinemet (upon arrival to short stay) accordingly, to have Morgan [name] dosed every 2 hours while awake. ?

## 2021-12-17 ENCOUNTER — Ambulatory Visit (HOSPITAL_BASED_OUTPATIENT_CLINIC_OR_DEPARTMENT_OTHER): Payer: Medicare Other | Admitting: Anesthesiology

## 2021-12-17 ENCOUNTER — Ambulatory Visit (HOSPITAL_COMMUNITY): Payer: Medicare Other

## 2021-12-17 ENCOUNTER — Encounter (HOSPITAL_COMMUNITY): Payer: Self-pay | Admitting: Urology

## 2021-12-17 ENCOUNTER — Ambulatory Visit (HOSPITAL_COMMUNITY)
Admission: RE | Admit: 2021-12-17 | Discharge: 2021-12-17 | Disposition: A | Discharge status: Home / Self Care | Payer: Medicare Other | Source: Ambulatory Visit | Attending: Urology | Admitting: Urology

## 2021-12-17 ENCOUNTER — Encounter (HOSPITAL_COMMUNITY)
Admission: RE | Disposition: A | Discharge status: Home / Self Care | Payer: Self-pay | Source: Ambulatory Visit | Attending: Urology

## 2021-12-17 ENCOUNTER — Ambulatory Visit (HOSPITAL_COMMUNITY): Payer: Medicare Other | Admitting: Physician Assistant

## 2021-12-17 DIAGNOSIS — G20 Parkinson's disease: Secondary | ICD-10-CM | POA: Insufficient documentation

## 2021-12-17 DIAGNOSIS — I340 Nonrheumatic mitral (valve) insufficiency: Secondary | ICD-10-CM

## 2021-12-17 DIAGNOSIS — F419 Anxiety disorder, unspecified: Secondary | ICD-10-CM | POA: Insufficient documentation

## 2021-12-17 DIAGNOSIS — N201 Calculus of ureter: Secondary | ICD-10-CM

## 2021-12-17 DIAGNOSIS — M797 Fibromyalgia: Secondary | ICD-10-CM | POA: Diagnosis not present

## 2021-12-17 DIAGNOSIS — Z79899 Other long term (current) drug therapy: Secondary | ICD-10-CM | POA: Diagnosis not present

## 2021-12-17 DIAGNOSIS — Z87891 Personal history of nicotine dependence: Secondary | ICD-10-CM | POA: Diagnosis not present

## 2021-12-17 DIAGNOSIS — I10 Essential (primary) hypertension: Secondary | ICD-10-CM

## 2021-12-17 HISTORY — PX: URETEROSCOPY WITH HOLMIUM LASER LITHOTRIPSY: SHX6645

## 2021-12-17 SURGERY — URETEROSCOPY, WITH LITHOTRIPSY USING HOLMIUM LASER
Anesthesia: General | Site: Urethra | Laterality: Left

## 2021-12-17 MED ORDER — ONDANSETRON HCL 4 MG/2ML IJ SOLN: 4.0000 mg | Freq: Once | INTRAMUSCULAR | Status: DC | PRN

## 2021-12-17 MED ORDER — ORAL CARE MOUTH RINSE
15.0000 mL | Freq: Once | OROMUCOSAL | Status: AC
  Administered 2021-12-17: 15 mL via OROMUCOSAL

## 2021-12-17 MED ORDER — CHLORHEXIDINE GLUCONATE 0.12 % MT SOLN: 15.0000 mL | Freq: Once | OROMUCOSAL | Status: AC

## 2021-12-17 MED ORDER — CEPHALEXIN 250 MG PO CAPS: 250.0000 mg | ORAL_CAPSULE | Freq: Every day | ORAL | 0 refills | Status: DC

## 2021-12-17 MED ORDER — OXYCODONE HCL 5 MG/5ML PO SOLN: 5.0000 mg | Freq: Once | ORAL | Status: DC | PRN

## 2021-12-17 MED ORDER — PROPOFOL 10 MG/ML IV BOLUS
INTRAVENOUS | Status: AC
  Filled 2021-12-17: qty 20

## 2021-12-17 MED ORDER — OXYCODONE HCL 5 MG PO TABS: 5.0000 mg | ORAL_TABLET | Freq: Once | ORAL | Status: DC | PRN

## 2021-12-17 MED ORDER — MIDAZOLAM HCL 2 MG/2ML IJ SOLN
INTRAMUSCULAR | Status: AC
  Filled 2021-12-17: qty 2

## 2021-12-17 MED ORDER — FENTANYL CITRATE PF 50 MCG/ML IJ SOSY: 25.0000 ug | PREFILLED_SYRINGE | INTRAMUSCULAR | Status: DC | PRN

## 2021-12-17 MED ORDER — ONDANSETRON HCL 4 MG/2ML IJ SOLN
INTRAMUSCULAR | Status: AC
  Filled 2021-12-17: qty 2

## 2021-12-17 MED ORDER — ONDANSETRON HCL 4 MG/2ML IJ SOLN
INTRAMUSCULAR | Status: DC | PRN
  Administered 2021-12-17: 4 mg via INTRAVENOUS

## 2021-12-17 MED ORDER — PROPOFOL 10 MG/ML IV BOLUS
INTRAVENOUS | Status: DC | PRN
Start: 2021-12-17 — End: 2021-12-17
  Administered 2021-12-17: 10 mg via INTRAVENOUS

## 2021-12-17 MED ORDER — LACTATED RINGERS IV SOLN: INTRAVENOUS | Status: DC

## 2021-12-17 MED ORDER — LIDOCAINE 2% (20 MG/ML) 5 ML SYRINGE
INTRAMUSCULAR | Status: DC | PRN
  Administered 2021-12-17: 50 mg via INTRAVENOUS

## 2021-12-17 MED ORDER — CARBIDOPA-LEVODOPA 25-100 MG PO TABS
1.0000 | ORAL_TABLET | ORAL | Status: DC | PRN
  Administered 2021-12-17: 1 via ORAL
  Filled 2021-12-17 (×3): qty 1

## 2021-12-17 MED ORDER — LIDOCAINE HCL (PF) 2 % IJ SOLN
INTRAMUSCULAR | Status: AC
  Filled 2021-12-17: qty 5

## 2021-12-17 MED ORDER — CARBIDOPA-LEVODOPA ER 25-100 MG PO TBCR: 1.0000 | EXTENDED_RELEASE_TABLET | Freq: Two times a day (BID) | ORAL | Status: DC

## 2021-12-17 MED ORDER — MIDAZOLAM HCL 5 MG/5ML IJ SOLN
INTRAMUSCULAR | Status: DC | PRN
Start: 2021-12-17 — End: 2021-12-17
  Administered 2021-12-17 (×2): .5 mg via INTRAVENOUS
  Administered 2021-12-17: 1 mg via INTRAVENOUS

## 2021-12-17 MED ORDER — FENTANYL CITRATE (PF) 100 MCG/2ML IJ SOLN
INTRAMUSCULAR | Status: AC
  Filled 2021-12-17: qty 2

## 2021-12-17 MED ORDER — DEXAMETHASONE SODIUM PHOSPHATE 10 MG/ML IJ SOLN
INTRAMUSCULAR | Status: AC
  Filled 2021-12-17: qty 1

## 2021-12-17 MED ORDER — FENTANYL CITRATE (PF) 100 MCG/2ML IJ SOLN
INTRAMUSCULAR | Status: DC | PRN
  Administered 2021-12-17 (×2): 25 ug via INTRAVENOUS

## 2021-12-17 MED ORDER — DEXAMETHASONE SODIUM PHOSPHATE 10 MG/ML IJ SOLN
INTRAMUSCULAR | Status: DC | PRN
  Administered 2021-12-17: 5 mg via INTRAVENOUS

## 2021-12-17 MED ORDER — SODIUM CHLORIDE 0.9 % IR SOLN
Status: DC | PRN
  Administered 2021-12-17: 3000 mL
  Administered 2021-12-17: 1000 mL

## 2021-12-17 MED ORDER — CEFAZOLIN SODIUM-DEXTROSE 2-4 GM/100ML-% IV SOLN
2.0000 g | Freq: Once | INTRAVENOUS | Status: AC
  Administered 2021-12-17: 2 g via INTRAVENOUS
  Filled 2021-12-17: qty 100

## 2021-12-17 SURGICAL SUPPLY — 20 items
BAG URO CATCHER STRL LF (MISCELLANEOUS) ×3 IMPLANT
CATH URETERAL DUAL LUMEN 10F (MISCELLANEOUS) IMPLANT
CATH URETL OPEN 5X70 (CATHETERS) ×3 IMPLANT
CLOTH BEACON ORANGE TIMEOUT ST (SAFETY) ×1 IMPLANT
DRSG TEGADERM 2-3/8X2-3/4 SM (GAUZE/BANDAGES/DRESSINGS) ×2 IMPLANT
GLOVE BIO SURGEON STRL SZ7.5 (GLOVE) ×3 IMPLANT
GLOVE BIO SURGEON STRL SZ8 (GLOVE) ×2 IMPLANT
GOWN STRL REUS W/ TWL LRG LVL3 (GOWN DISPOSABLE) ×4 IMPLANT
GOWN STRL REUS W/TWL LRG LVL3 (GOWN DISPOSABLE) ×9
GUIDEWIRE ANG ZIPWIRE 038X150 (WIRE) IMPLANT
GUIDEWIRE STR DUAL SENSOR (WIRE) ×3 IMPLANT
GUIDEWIRE ZIPWRE .038 STRAIGHT (WIRE) IMPLANT
KIT TURNOVER KIT A (KITS) ×3 IMPLANT
MANIFOLD NEPTUNE II (INSTRUMENTS) ×3 IMPLANT
PACK CYSTO (CUSTOM PROCEDURE TRAY) ×3 IMPLANT
STENT URET 6FRX24 CONTOUR (STENTS) ×2 IMPLANT
TRACTIP FLEXIVA PULSE ID 200 (Laser) ×2 IMPLANT
TUBING CONNECTING 10 (TUBING) ×3 IMPLANT
TUBING UROLOGY SET (TUBING) ×3 IMPLANT
WATER STERILE IRR 500ML POUR (IV SOLUTION) ×3 IMPLANT

## 2021-12-17 NOTE — Anesthesia Procedure Notes (Signed)
Procedure Name: LMA Insertion ?Date/Time: 12/17/2021 1:36 PM ?Performed by: Garrel Ridgel, CRNA ?Pre-anesthesia Checklist: Patient identified, Emergency Drugs available, Suction available and Patient being monitored ?Patient Re-evaluated:Patient Re-evaluated prior to induction ?Oxygen Delivery Method: Circle system utilized ?Preoxygenation: Pre-oxygenation with 100% oxygen ?Induction Type: IV induction ?Ventilation: Mask ventilation without difficulty ?LMA Size: 4.0 ?Number of attempts: 1 ?Placement Confirmation: positive ETCO2 ?Tube secured with: Tape ?Dental Injury: Teeth and Oropharynx as per pre-operative assessment  ? ? ? ? ?

## 2021-12-17 NOTE — Anesthesia Postprocedure Evaluation (Signed)
Anesthesia Post Note ? ?Patient: Morgan [name] ? ?Procedure(s) Performed: URETEROSCOPY/HOLMIUM LASER/STENT EXCHANGE (Left: Urethra) ? ?  ? ?Patient location during evaluation: PACU ?Anesthesia Type: General ?Level of consciousness: awake and alert ?Pain management: pain level controlled ?Vital Signs Assessment: post-procedure vital signs reviewed and stable ?Respiratory status: spontaneous breathing, nonlabored ventilation, respiratory function stable and patient connected to nasal cannula oxygen ?Cardiovascular status: blood pressure returned to baseline and stable ?Postop Assessment: no apparent nausea or vomiting ?Anesthetic complications: no ? ? ?No notable events documented. ? ?Last Vitals:  ?Vitals:  ? 12/17/21 1430 12/17/21 1433  ?BP: 130/80 132/82  ?Pulse: 76 83  ?Resp: 12 (!) 25  ?Temp:    ?SpO2: 90% 98%  ?  ?Last Pain:  ?Vitals:  ? 12/17/21 1500  ?TempSrc:   ?PainSc: 0-No pain  ? ? ?  ?  ?  ?  ?  ?  ? ?Suzette Battiest E ? ? ? ? ?

## 2021-12-17 NOTE — Anesthesia Preprocedure Evaluation (Addendum)
Anesthesia Evaluation  ? ? ?Reviewed: ?Allergy & Precautions, Patient's Chart, lab work & pertinent test results ? ?History of Anesthesia Complications ?Negative for: history of anesthetic complications ? ?Airway ?Mallampati: III ? ?TM Distance: >3 FB ?Neck ROM: Full ? ? ? Dental ? ?(+) Dental Advisory Given ?  ?Pulmonary ?former smoker,  ?  ?Pulmonary exam normal ? ? ? ? ? ? ? Cardiovascular ?hypertension, Pt. on medications ?Normal cardiovascular exam+ dysrhythmias + Valvular Problems/Murmurs MR  ? ? ?'23 TTE - EF 60 to 65%. Mild to moderate mitral valve regurgitation.  ? ?  ?Neuro/Psych ? Headaches, PSYCHIATRIC DISORDERS Anxiety  ?Parkinson's disease ? ? Neuromuscular disease   ? GI/Hepatic ?negative GI ROS, Neg liver ROS,   ?Endo/Other  ?negative endocrine ROS ? Renal/GU ?negative Renal ROS  ? ?  ?Musculoskeletal ? ?(+) Fibromyalgia - ? Abdominal ?  ?Peds ? Hematology ?negative hematology ROS ?(+)   ?Anesthesia Other Findings ? ? Reproductive/Obstetrics ? ?  ? ? ? ? ? ? ? ? ? ? ? ? ? ?  ?  ? ? ? ? ? ? ? ?Anesthesia Physical ?Anesthesia Plan ? ?ASA: 3 ? ?Anesthesia Plan: General  ? ?Post-op Pain Management: Minimal or no pain anticipated  ? ?Induction: Intravenous ? ?PONV Risk Score and Plan: 3 and Treatment may vary due to age or medical condition and Ondansetron ? ?Airway Management Planned: LMA ? ?Additional Equipment: None ? ?Intra-op Plan:  ? ?Post-operative Plan: Extubation in OR ? ?Informed Consent: I have reviewed the patients History and Physical, chart, labs and discussed the procedure including the risks, benefits and alternatives for the proposed anesthesia with the patient or authorized representative who has indicated his/her understanding and acceptance.  ? ? ? ?Dental advisory given ? ?Plan Discussed with: CRNA and Anesthesiologist ? ?Anesthesia Plan Comments:   ? ? ? ? ? ?Anesthesia Quick Evaluation ? ?

## 2021-12-17 NOTE — Transfer of Care (Signed)
Immediate Anesthesia Transfer of Care Note ? ?Patient: Morgan [name] ? ?Procedure(s) Performed: URETEROSCOPY/HOLMIUM LASER/STENT EXCHANGE (Left: Urethra) ? ?Patient Location: PACU ? ?Anesthesia Type:General ? ?Level of Consciousness: awake, alert , oriented and patient cooperative ? ?Airway & Oxygen Therapy: Patient Spontanous Breathing and Patient connected to face mask oxygen ? ?Post-op Assessment: Report given to RN and Post -op Vital signs reviewed and stable ? ?Post vital signs: Reviewed and stable ? ?Last Vitals:  ?Vitals Value Taken Time  ?BP 149/91 12/17/21 1415  ?Temp 36.4 ?C 12/17/21 1413  ?Pulse 77 12/17/21 1416  ?Resp 10 12/17/21 1416  ?SpO2 100 % 12/17/21 1416  ?Vitals shown include unvalidated device data. ? ?Last Pain:  ?Vitals:  ? 12/17/21 1140  ?TempSrc: Oral  ?   ? ?  ? ?Complications: No notable events documented. ?

## 2021-12-17 NOTE — H&P (Signed)
H&P ? ?Chief Complaint: Left ureteral stone ? ?History of Present Illness: Morgan [name] is a 71 year old female who presented with flank pain and sepsis and had a left distal ureteral stone.  Her urine culture ended up growing E. coli.  She was stented urgently.  She presents today for definitive stone management.  She has been on cephalexin nightly and 2 days ago I increased her to twice daily.  She has been taking antibiotics.  Urine has been clear.  She gets some stent pain and takes Azo.  She has had no fever. ? ?Past Medical History:  ?Diagnosis Date  ? Anxiety   ? Fibromyalgia   ? Headache   ? Hypercholesteremia   ? controlled on medication  ? Hyperlipidemia   ? Hypertension   ? no current meds  ? LBBB (left bundle branch block)   ? Melanoma ([place])   ? MVP (mitral valve prolapse)   ? Osteopenia   ? Parkinson disease ([place])   ? Pneumonia   ? SVD (spontaneous vaginal delivery)   ? x 2  ? ?Past Surgical History:  ?Procedure Laterality Date  ? BLEPHAROPLASTY    ? cold knife conization    ? COLONOSCOPY    ? FH colon CA  ? CYSTOSCOPY W/ URETERAL STENT PLACEMENT Left 10/18/2021  ? Procedure: CYSTOSCOPY WITH RETROGRADE PYELOGRAM, URETERAL STENT PLACEMENT;  Surgeon: Festus Aloe, MD;  Location: [facility];  Service: Urology;  Laterality: Left;  ? DILATATION & CURETTAGE/HYSTEROSCOPY WITH MYOSURE N/A 02/14/2018  ? Procedure: ATTEMPTED DILATATION & CURETTAGE/HYSTEROSCOPY WITH MYOSURE;  Surgeon: Christophe Louis, MD;  Location: [facility];  Service: Gynecology;  Laterality: N/A;  polyp  ? melanoma cancer    ? removed from left leg  ? RECONSTRUCTION OF EYELID    ? TONSILLECTOMY    ? TUBAL LIGATION    ? WISDOM TOOTH EXTRACTION    ? ? ?Home Medications:  ?Medications Prior to Admission  ?Medication Sig Dispense Refill Last Dose  ? ALPRAZolam (XANAX) 0.25 MG tablet Take 0.25 mg by mouth daily as needed for anxiety.    12/17/2021 at 0600  ? aspirin 325 MG tablet Take 325-650 mg by mouth daily as needed (for headaches/pain.).    12/16/2021  ?  atorvastatin (LIPITOR) 10 MG tablet Take 10 mg by mouth in the morning.   12/16/2021  ? carbidopa-levodopa (SINEMET IR) 25-100 MG tablet TAKE 1 TABLET EVERY 2 HOURS UP TO 8 TABLETS PER DAY (Patient taking differently: Take 1 tablet by mouth as directed. 1 tablet every 2 hours ( up to 8 tablets per day)) 720 tablet 1 12/17/2021 at 1100  ? cephALEXin (KEFLEX) 250 MG capsule Take 250 mg by mouth 2 (two) times daily.   12/17/2021 at 0600  ? Cholecalciferol (VITAMIN D-3) 5000 units TABS Take 5,000 Units by mouth 2 (two) times daily.   12/16/2021  ? Coenzyme Q10-Vitamin E (QUNOL ULTRA COQ10 PO) Take 1 capsule by mouth daily.   12/16/2021  ? CYMBALTA 60 MG capsule TAKE 1 CAPSULE DAILY (Patient taking differently: Take 60 mg by mouth daily.) 90 capsule 1 12/17/2021 at 0600  ? gabapentin (NEURONTIN) 400 MG capsule TAKE 1 CAPSULE 3 TIMES A   DAY (Patient taking differently: Take 400 mg by mouth 3 (three) times daily as needed (pain).) 270 capsule 1 12/17/2021 at 0600  ? lisinopril (ZESTRIL) 10 MG tablet Take 1 tablet (10 mg total) by mouth daily. 30 tablet 2 12/16/2021  ? lisinopril (ZESTRIL) 5 MG tablet Take 1 tablet (5 mg total)  by mouth as needed (If systolic blood pressure less than 110, hold Lisinopril '10mg'$  and take Lisinopril '5mg'$ ). If systolic blood pressure less than 110, hold Lisinopril '10mg'$  and take Lisinopril '5mg'$  30 tablet 2 12/16/2021  ? methocarbamol (ROBAXIN) 750 MG tablet Take 750 mg by mouth every 6 (six) hours as needed for muscle spasms.   12/16/2021  ? Multiple Vitamins-Minerals (PRESERVISION AREDS 2 PO) Take 1 tablet by mouth 2 (two) times daily.   12/16/2021  ? phenazopyridine (PYRIDIUM) 95 MG tablet Take 95 mg by mouth 3 (three) times daily as needed for pain.   12/16/2021  ? Polyethylene Glycol 400 (BLINK TEARS) 0.25 % SOLN Place 1 drop into both eyes daily as needed (dry eyes).   12/16/2021  ? raloxifene (EVISTA) 60 MG tablet Take 60 mg by mouth daily.   12/16/2021  ? ramelteon (ROZEREM) 8 MG tablet TAKE 1 TABLET AT  BEDTIME (Patient taking differently: Take 8 mg by mouth at bedtime.) 90 tablet 1 12/16/2021  ? tamsulosin (FLOMAX) 0.4 MG CAPS capsule Take 1 capsule (0.4 mg total) by mouth daily after supper. (Patient taking differently: Take 0.4 mg by mouth every other day. Problems with BP dropping) 30 capsule 0 12/16/2021  ? ?Allergies:  ?Allergies  ?Allergen Reactions  ? Other Anaphylaxis  ?  "Anti Seizure Medications"  ? Phenergan [Promethazine Hcl] Other (See Comments)  ?  Makes Parkinson worse  ? Sporanox [Itraconazole] Hives  ? Tylenol [Acetaminophen] Other (See Comments)  ?  "I just get weird"  ? ? ?Family History  ?Problem Relation Age of Onset  ? Cancer Mother   ?     colon  ? Healthy Brother   ? Healthy Sister   ? Healthy Child   ? ?Social History:  reports that she quit smoking about 24 years ago. Her smoking use included cigarettes. She smoked an average of .25 packs per day. She has never used smokeless tobacco. She reports current alcohol use. She reports that she does not use drugs. ? ?ROS: ?A complete review of systems was performed.  All systems are negative except for pertinent findings as noted. ?Review of Systems  ?All other systems reviewed and are negative. ? ? ?Physical Exam:  ?Vital signs in last 24 hours: ?Temp:  [98.1 ?F (36.7 ?C)] 98.1 ?F (36.7 ?C) (04/21 1140) ?Pulse Rate:  [93] 93 (04/21 1140) ?Resp:  [18] 18 (04/21 1140) ?BP: (168)/(93) 168/93 (04/21 1140) ?SpO2:  [95 %] 95 % (04/21 1140) ?General:  Alert and oriented, No acute distress ?HEENT: Normocephalic, atraumatic ?Cardiovascular: Regular rate and rhythm ?Lungs: Regular rate and effort ?Abdomen: Soft, nontender, nondistended, no abdominal masses ?Back: No CVA tenderness ?Extremities: No edema ?Neurologic: Grossly intact, dyskinesia of her arms and feet. ? ? ?Laboratory Data:  ?No results found for this or any previous visit (from the past 24 hour(s)). ?No results found for this or any previous visit (from the past 240  hour(s)). ?Creatinine: ?Recent Labs  ?  12/14/21 ?1327  ?CREATININE 0.78  ? ? ?Impression/Assessment:  ?Left ureteral stone status post urgent stent 10/17/2021. ? ?Plan:  ?I discussed with the patient the nature, potential benefits, risks and alternatives to cystoscopy, left ureteral stent exchange, left ureteroscopy, laser lithotripsy, including side effects of the proposed treatment, the likelihood of the patient achieving the goals of the procedure, and any potential problems that might occur during the procedure or recuperation. All questions answered. Patient elects to proceed. ? ?Festus Aloe ?12/17/2021, 12:14 PM   ?

## 2021-12-17 NOTE — Op Note (Signed)
Preoperative diagnosis: Left ureteral stone ?Postoperative diagnosis: Same ? ?Procedure: Cystoscopy with left ureteral stent exchange, left ureteroscopy, laser lithotripsy ? ?Surgeon: Junious Silk ? ?Anesthesia: General ? ?Indication for procedure: Morgan [name] is a 71 year old female who underwent an urgent stent in February for a left mid ureteral stone and hydronephrosis and UTI.  She presents today for definitive stone management. ? ?Findings: Small stone left distal ureter dusted. ? ?Description of procedure: After consent was obtained patient brought to the operating room.  After adequate anesthesia she was placed lithotomy position and prepped and draped in the usual sterile fashion.  Timeout was performed to confirm the patient and procedure.  Cystoscope was passed per urethra and the bladder inspected.  Left ureteral stent grasped and removed through the urethral meatus and a sensor wire was advanced and coiled in the kidney.  Stent was removed without difficulty.  A short dual channel semirigid ureteroscope was advanced into the left ureter where the stone was located.  It was small but the distal ureter had some edema and was a bit tight, therefore I did not want to engage the stone in a basket and tried to extracted intact.  A 200 ?m laser fiber was advanced and the stone quickly dusted.  Inspection up to the proximal ureter noted no retrograde stone fragments and no injury.  Scope was backed out and the stent backloaded on the cystoscope.  A 6 x 24 cm stent advanced.  A good coil seen in the kidney and a good coil in the bladder.  The bladder was drained and the scope removed.  She was awakened taken recovery room in stable condition. ? ?Complications: None ? ?Blood loss: Minimal ? ?Specimens: None ? ?Drains: 6 x 24 cm left ureteral stent with string ? ?Disposition: Patient stable to PACU-I discussed the procedure postop care and follow-up with [name].  They can go ahead and stop the tamsulosin. Continue nightly  antibiotic. ?

## 2021-12-18 ENCOUNTER — Encounter (HOSPITAL_COMMUNITY): Payer: Self-pay | Admitting: Urology

## 2021-12-21 DIAGNOSIS — E7800 Pure hypercholesterolemia, unspecified: Secondary | ICD-10-CM | POA: Diagnosis not present

## 2021-12-21 DIAGNOSIS — Z960 Presence of urogenital implants: Secondary | ICD-10-CM | POA: Diagnosis not present

## 2021-12-21 DIAGNOSIS — G9341 Metabolic encephalopathy: Secondary | ICD-10-CM | POA: Diagnosis not present

## 2021-12-21 DIAGNOSIS — I447 Left bundle-branch block, unspecified: Secondary | ICD-10-CM | POA: Diagnosis not present

## 2021-12-21 DIAGNOSIS — I341 Nonrheumatic mitral (valve) prolapse: Secondary | ICD-10-CM | POA: Diagnosis not present

## 2021-12-21 DIAGNOSIS — K08409 Partial loss of teeth, unspecified cause, unspecified class: Secondary | ICD-10-CM | POA: Diagnosis not present

## 2021-12-21 DIAGNOSIS — Z9851 Tubal ligation status: Secondary | ICD-10-CM | POA: Diagnosis not present

## 2021-12-21 DIAGNOSIS — I493 Ventricular premature depolarization: Secondary | ICD-10-CM | POA: Diagnosis not present

## 2021-12-21 DIAGNOSIS — M797 Fibromyalgia: Secondary | ICD-10-CM | POA: Diagnosis not present

## 2021-12-21 DIAGNOSIS — N390 Urinary tract infection, site not specified: Secondary | ICD-10-CM | POA: Diagnosis not present

## 2021-12-21 DIAGNOSIS — N136 Pyonephrosis: Secondary | ICD-10-CM | POA: Diagnosis not present

## 2021-12-21 DIAGNOSIS — Z9181 History of falling: Secondary | ICD-10-CM | POA: Diagnosis not present

## 2021-12-21 DIAGNOSIS — Z7982 Long term (current) use of aspirin: Secondary | ICD-10-CM | POA: Diagnosis not present

## 2021-12-21 DIAGNOSIS — Z792 Long term (current) use of antibiotics: Secondary | ICD-10-CM | POA: Diagnosis not present

## 2021-12-21 DIAGNOSIS — G20 Parkinson's disease: Secondary | ICD-10-CM | POA: Diagnosis not present

## 2021-12-21 DIAGNOSIS — F419 Anxiety disorder, unspecified: Secondary | ICD-10-CM | POA: Diagnosis not present

## 2021-12-21 DIAGNOSIS — M8580 Other specified disorders of bone density and structure, unspecified site: Secondary | ICD-10-CM | POA: Diagnosis not present

## 2021-12-21 DIAGNOSIS — I119 Hypertensive heart disease without heart failure: Secondary | ICD-10-CM | POA: Diagnosis not present

## 2021-12-21 DIAGNOSIS — G609 Hereditary and idiopathic neuropathy, unspecified: Secondary | ICD-10-CM | POA: Diagnosis not present

## 2021-12-21 DIAGNOSIS — C439 Malignant melanoma of skin, unspecified: Secondary | ICD-10-CM | POA: Diagnosis not present

## 2021-12-21 DIAGNOSIS — K8020 Calculus of gallbladder without cholecystitis without obstruction: Secondary | ICD-10-CM | POA: Diagnosis not present

## 2021-12-21 DIAGNOSIS — Z87891 Personal history of nicotine dependence: Secondary | ICD-10-CM | POA: Diagnosis not present

## 2021-12-29 DIAGNOSIS — N390 Urinary tract infection, site not specified: Secondary | ICD-10-CM | POA: Diagnosis not present

## 2021-12-29 DIAGNOSIS — N136 Pyonephrosis: Secondary | ICD-10-CM | POA: Diagnosis not present

## 2021-12-29 DIAGNOSIS — G9341 Metabolic encephalopathy: Secondary | ICD-10-CM | POA: Diagnosis not present

## 2021-12-29 DIAGNOSIS — G609 Hereditary and idiopathic neuropathy, unspecified: Secondary | ICD-10-CM | POA: Diagnosis not present

## 2021-12-29 DIAGNOSIS — I119 Hypertensive heart disease without heart failure: Secondary | ICD-10-CM | POA: Diagnosis not present

## 2021-12-29 DIAGNOSIS — G20 Parkinson's disease: Secondary | ICD-10-CM | POA: Diagnosis not present

## 2021-12-30 DIAGNOSIS — N136 Pyonephrosis: Secondary | ICD-10-CM | POA: Diagnosis not present

## 2021-12-30 DIAGNOSIS — G20 Parkinson's disease: Secondary | ICD-10-CM | POA: Diagnosis not present

## 2021-12-30 DIAGNOSIS — N390 Urinary tract infection, site not specified: Secondary | ICD-10-CM | POA: Diagnosis not present

## 2021-12-30 DIAGNOSIS — G9341 Metabolic encephalopathy: Secondary | ICD-10-CM | POA: Diagnosis not present

## 2021-12-30 DIAGNOSIS — G609 Hereditary and idiopathic neuropathy, unspecified: Secondary | ICD-10-CM | POA: Diagnosis not present

## 2021-12-30 DIAGNOSIS — I119 Hypertensive heart disease without heart failure: Secondary | ICD-10-CM | POA: Diagnosis not present

## 2022-01-03 DIAGNOSIS — I119 Hypertensive heart disease without heart failure: Secondary | ICD-10-CM | POA: Diagnosis not present

## 2022-01-03 DIAGNOSIS — G9341 Metabolic encephalopathy: Secondary | ICD-10-CM | POA: Diagnosis not present

## 2022-01-03 DIAGNOSIS — G20 Parkinson's disease: Secondary | ICD-10-CM | POA: Diagnosis not present

## 2022-01-03 DIAGNOSIS — N390 Urinary tract infection, site not specified: Secondary | ICD-10-CM | POA: Diagnosis not present

## 2022-01-03 DIAGNOSIS — N136 Pyonephrosis: Secondary | ICD-10-CM | POA: Diagnosis not present

## 2022-01-03 DIAGNOSIS — G609 Hereditary and idiopathic neuropathy, unspecified: Secondary | ICD-10-CM | POA: Diagnosis not present

## 2022-01-05 DIAGNOSIS — G609 Hereditary and idiopathic neuropathy, unspecified: Secondary | ICD-10-CM | POA: Diagnosis not present

## 2022-01-05 DIAGNOSIS — I119 Hypertensive heart disease without heart failure: Secondary | ICD-10-CM | POA: Diagnosis not present

## 2022-01-05 DIAGNOSIS — G20 Parkinson's disease: Secondary | ICD-10-CM | POA: Diagnosis not present

## 2022-01-05 DIAGNOSIS — N136 Pyonephrosis: Secondary | ICD-10-CM | POA: Diagnosis not present

## 2022-01-05 DIAGNOSIS — G9341 Metabolic encephalopathy: Secondary | ICD-10-CM | POA: Diagnosis not present

## 2022-01-05 DIAGNOSIS — N390 Urinary tract infection, site not specified: Secondary | ICD-10-CM | POA: Diagnosis not present

## 2022-01-10 DIAGNOSIS — N390 Urinary tract infection, site not specified: Secondary | ICD-10-CM | POA: Diagnosis not present

## 2022-01-10 DIAGNOSIS — G9341 Metabolic encephalopathy: Secondary | ICD-10-CM | POA: Diagnosis not present

## 2022-01-10 DIAGNOSIS — I119 Hypertensive heart disease without heart failure: Secondary | ICD-10-CM | POA: Diagnosis not present

## 2022-01-10 DIAGNOSIS — G20 Parkinson's disease: Secondary | ICD-10-CM | POA: Diagnosis not present

## 2022-01-10 DIAGNOSIS — N136 Pyonephrosis: Secondary | ICD-10-CM | POA: Diagnosis not present

## 2022-01-10 DIAGNOSIS — G609 Hereditary and idiopathic neuropathy, unspecified: Secondary | ICD-10-CM | POA: Diagnosis not present

## 2022-01-13 DIAGNOSIS — N136 Pyonephrosis: Secondary | ICD-10-CM | POA: Diagnosis not present

## 2022-01-13 DIAGNOSIS — G609 Hereditary and idiopathic neuropathy, unspecified: Secondary | ICD-10-CM | POA: Diagnosis not present

## 2022-01-13 DIAGNOSIS — G20 Parkinson's disease: Secondary | ICD-10-CM | POA: Diagnosis not present

## 2022-01-13 DIAGNOSIS — G9341 Metabolic encephalopathy: Secondary | ICD-10-CM | POA: Diagnosis not present

## 2022-01-13 DIAGNOSIS — N390 Urinary tract infection, site not specified: Secondary | ICD-10-CM | POA: Diagnosis not present

## 2022-01-13 DIAGNOSIS — I119 Hypertensive heart disease without heart failure: Secondary | ICD-10-CM | POA: Diagnosis not present

## 2022-01-17 DIAGNOSIS — I119 Hypertensive heart disease without heart failure: Secondary | ICD-10-CM | POA: Diagnosis not present

## 2022-01-17 DIAGNOSIS — N136 Pyonephrosis: Secondary | ICD-10-CM | POA: Diagnosis not present

## 2022-01-17 DIAGNOSIS — G20 Parkinson's disease: Secondary | ICD-10-CM | POA: Diagnosis not present

## 2022-01-17 DIAGNOSIS — N390 Urinary tract infection, site not specified: Secondary | ICD-10-CM | POA: Diagnosis not present

## 2022-01-17 DIAGNOSIS — G9341 Metabolic encephalopathy: Secondary | ICD-10-CM | POA: Diagnosis not present

## 2022-01-17 DIAGNOSIS — G609 Hereditary and idiopathic neuropathy, unspecified: Secondary | ICD-10-CM | POA: Diagnosis not present

## 2022-01-18 ENCOUNTER — Encounter: Payer: Self-pay | Admitting: Neurology

## 2022-01-19 DIAGNOSIS — G20 Parkinson's disease: Secondary | ICD-10-CM | POA: Diagnosis not present

## 2022-01-19 DIAGNOSIS — G9341 Metabolic encephalopathy: Secondary | ICD-10-CM | POA: Diagnosis not present

## 2022-01-19 DIAGNOSIS — N390 Urinary tract infection, site not specified: Secondary | ICD-10-CM | POA: Diagnosis not present

## 2022-01-19 DIAGNOSIS — N136 Pyonephrosis: Secondary | ICD-10-CM | POA: Diagnosis not present

## 2022-01-19 DIAGNOSIS — G609 Hereditary and idiopathic neuropathy, unspecified: Secondary | ICD-10-CM | POA: Diagnosis not present

## 2022-01-19 DIAGNOSIS — I119 Hypertensive heart disease without heart failure: Secondary | ICD-10-CM | POA: Diagnosis not present

## 2022-01-20 DIAGNOSIS — N136 Pyonephrosis: Secondary | ICD-10-CM | POA: Diagnosis not present

## 2022-01-20 DIAGNOSIS — Z87891 Personal history of nicotine dependence: Secondary | ICD-10-CM | POA: Diagnosis not present

## 2022-01-20 DIAGNOSIS — M797 Fibromyalgia: Secondary | ICD-10-CM | POA: Diagnosis not present

## 2022-01-20 DIAGNOSIS — Z9851 Tubal ligation status: Secondary | ICD-10-CM | POA: Diagnosis not present

## 2022-01-20 DIAGNOSIS — C439 Malignant melanoma of skin, unspecified: Secondary | ICD-10-CM | POA: Diagnosis not present

## 2022-01-20 DIAGNOSIS — I341 Nonrheumatic mitral (valve) prolapse: Secondary | ICD-10-CM | POA: Diagnosis not present

## 2022-01-20 DIAGNOSIS — M8580 Other specified disorders of bone density and structure, unspecified site: Secondary | ICD-10-CM | POA: Diagnosis not present

## 2022-01-20 DIAGNOSIS — Z960 Presence of urogenital implants: Secondary | ICD-10-CM | POA: Diagnosis not present

## 2022-01-20 DIAGNOSIS — I447 Left bundle-branch block, unspecified: Secondary | ICD-10-CM | POA: Diagnosis not present

## 2022-01-20 DIAGNOSIS — Z7982 Long term (current) use of aspirin: Secondary | ICD-10-CM | POA: Diagnosis not present

## 2022-01-20 DIAGNOSIS — Z792 Long term (current) use of antibiotics: Secondary | ICD-10-CM | POA: Diagnosis not present

## 2022-01-20 DIAGNOSIS — I119 Hypertensive heart disease without heart failure: Secondary | ICD-10-CM | POA: Diagnosis not present

## 2022-01-20 DIAGNOSIS — K08409 Partial loss of teeth, unspecified cause, unspecified class: Secondary | ICD-10-CM | POA: Diagnosis not present

## 2022-01-20 DIAGNOSIS — G20 Parkinson's disease: Secondary | ICD-10-CM | POA: Diagnosis not present

## 2022-01-20 DIAGNOSIS — G9341 Metabolic encephalopathy: Secondary | ICD-10-CM | POA: Diagnosis not present

## 2022-01-20 DIAGNOSIS — E7800 Pure hypercholesterolemia, unspecified: Secondary | ICD-10-CM | POA: Diagnosis not present

## 2022-01-20 DIAGNOSIS — F419 Anxiety disorder, unspecified: Secondary | ICD-10-CM | POA: Diagnosis not present

## 2022-01-20 DIAGNOSIS — N390 Urinary tract infection, site not specified: Secondary | ICD-10-CM | POA: Diagnosis not present

## 2022-01-20 DIAGNOSIS — G609 Hereditary and idiopathic neuropathy, unspecified: Secondary | ICD-10-CM | POA: Diagnosis not present

## 2022-01-20 DIAGNOSIS — K8020 Calculus of gallbladder without cholecystitis without obstruction: Secondary | ICD-10-CM | POA: Diagnosis not present

## 2022-01-20 DIAGNOSIS — I493 Ventricular premature depolarization: Secondary | ICD-10-CM | POA: Diagnosis not present

## 2022-01-20 DIAGNOSIS — Z9181 History of falling: Secondary | ICD-10-CM | POA: Diagnosis not present

## 2022-01-26 DIAGNOSIS — N390 Urinary tract infection, site not specified: Secondary | ICD-10-CM | POA: Diagnosis not present

## 2022-01-26 DIAGNOSIS — G609 Hereditary and idiopathic neuropathy, unspecified: Secondary | ICD-10-CM | POA: Diagnosis not present

## 2022-01-26 DIAGNOSIS — G9341 Metabolic encephalopathy: Secondary | ICD-10-CM | POA: Diagnosis not present

## 2022-01-26 DIAGNOSIS — G20 Parkinson's disease: Secondary | ICD-10-CM | POA: Diagnosis not present

## 2022-01-26 DIAGNOSIS — I119 Hypertensive heart disease without heart failure: Secondary | ICD-10-CM | POA: Diagnosis not present

## 2022-01-26 DIAGNOSIS — N136 Pyonephrosis: Secondary | ICD-10-CM | POA: Diagnosis not present

## 2022-01-30 ENCOUNTER — Other Ambulatory Visit (HOSPITAL_BASED_OUTPATIENT_CLINIC_OR_DEPARTMENT_OTHER): Payer: Self-pay | Admitting: Family

## 2022-01-31 DIAGNOSIS — G609 Hereditary and idiopathic neuropathy, unspecified: Secondary | ICD-10-CM | POA: Diagnosis not present

## 2022-01-31 DIAGNOSIS — N136 Pyonephrosis: Secondary | ICD-10-CM | POA: Diagnosis not present

## 2022-01-31 DIAGNOSIS — G20 Parkinson's disease: Secondary | ICD-10-CM | POA: Diagnosis not present

## 2022-01-31 DIAGNOSIS — G9341 Metabolic encephalopathy: Secondary | ICD-10-CM | POA: Diagnosis not present

## 2022-01-31 DIAGNOSIS — I119 Hypertensive heart disease without heart failure: Secondary | ICD-10-CM | POA: Diagnosis not present

## 2022-01-31 DIAGNOSIS — N390 Urinary tract infection, site not specified: Secondary | ICD-10-CM | POA: Diagnosis not present

## 2022-01-31 NOTE — Telephone Encounter (Signed)
Rx request sent to pharmacy.  

## 2022-02-04 DIAGNOSIS — R8271 Bacteriuria: Secondary | ICD-10-CM | POA: Diagnosis not present

## 2022-02-04 DIAGNOSIS — N201 Calculus of ureter: Secondary | ICD-10-CM | POA: Diagnosis not present

## 2022-02-09 DIAGNOSIS — G20 Parkinson's disease: Secondary | ICD-10-CM | POA: Diagnosis not present

## 2022-02-09 DIAGNOSIS — G609 Hereditary and idiopathic neuropathy, unspecified: Secondary | ICD-10-CM | POA: Diagnosis not present

## 2022-02-09 DIAGNOSIS — N390 Urinary tract infection, site not specified: Secondary | ICD-10-CM | POA: Diagnosis not present

## 2022-02-09 DIAGNOSIS — I119 Hypertensive heart disease without heart failure: Secondary | ICD-10-CM | POA: Diagnosis not present

## 2022-02-09 DIAGNOSIS — N136 Pyonephrosis: Secondary | ICD-10-CM | POA: Diagnosis not present

## 2022-02-09 DIAGNOSIS — G9341 Metabolic encephalopathy: Secondary | ICD-10-CM | POA: Diagnosis not present

## 2022-02-16 DIAGNOSIS — I119 Hypertensive heart disease without heart failure: Secondary | ICD-10-CM | POA: Diagnosis not present

## 2022-02-16 DIAGNOSIS — G20 Parkinson's disease: Secondary | ICD-10-CM | POA: Diagnosis not present

## 2022-02-16 DIAGNOSIS — G9341 Metabolic encephalopathy: Secondary | ICD-10-CM | POA: Diagnosis not present

## 2022-02-16 DIAGNOSIS — G609 Hereditary and idiopathic neuropathy, unspecified: Secondary | ICD-10-CM | POA: Diagnosis not present

## 2022-02-16 DIAGNOSIS — N136 Pyonephrosis: Secondary | ICD-10-CM | POA: Diagnosis not present

## 2022-02-16 DIAGNOSIS — N390 Urinary tract infection, site not specified: Secondary | ICD-10-CM | POA: Diagnosis not present

## 2022-03-26 ENCOUNTER — Other Ambulatory Visit: Payer: Self-pay | Admitting: Neurology

## 2022-03-26 DIAGNOSIS — G20 Parkinson's disease: Secondary | ICD-10-CM

## 2022-03-26 DIAGNOSIS — G249 Dystonia, unspecified: Secondary | ICD-10-CM

## 2022-04-05 NOTE — Progress Notes (Unsigned)
Assessment/Plan:    1.  Parkinsons Disease             - Continue carbidopa/levodopa 25/100, 1 tablet every 2-3 hours (she takes a total of 6 tablets/day).  She was taking a total of 7 to 8 tablets/day.  She was a little more rigid today.  She and her husband backed down on her medication, primarily because dyskinesia was affecting her gait.  She and I discussed that she really needs to be using a walker since she is more rigid.             -Continue pramipexole ER, 0.75 mg daily.  They asked about increasing this and I don't want to do that due to age and hx of compulsive behavior on higher dosages.  -May be a good candidate for entacapone or opicapone in the future.  -she has inbrija but doesn't use it much.  She was scheduling it at one time but doesn't now.    -We discussed the new skin biopsies for alpha-synuclein.  Discussed that they have about 95-96% sensitivity and specificity.  Discussed utility of the biopsy.  Discussed that this really would not change what we did clinically.  She is very interested in the biopsy and would like to schedule in the future.  We will try to get that arranged. 2.  Fibromyalgia             -On Cymbalta             -On gabapentin, 400 mg twice daily (was 3 times daily)  -Ask about taking ibuprofen, 400 mg twice per day.  I told them from a pure Parkinson's standpoint I have no objection, but from a renal standpoint, they need to ask primary care.  This can also cause significant GI upset.  -having leg pain and I suspect that fibromyalgia is the issue, but not completely sure.  She would like to be referred to sports medicine.  Referral placed. 3.  History of melanoma             -See dermatology regularly, given the fact that she has had melanoma and Parkinson's increases risk for melanoma. 4.  Insomnia, with RBD             -On Rozerem.  -She is on now on just clonazepam.  She has gone back and forth between alprazolam and clonazepam over the years.   Primary care is filling this.  What I do not care for is that she is on the clonazepam bid.  In this age group with Parkinson's disease, it certainly increases her risk for falls, and she already has significant balance difficulty.  Subjective:   Morgan [name] was seen today in follow up for Parkinsons disease.  My previous records were reviewed prior to todays visit as well as outside records available to me.   Pt with husband who supplements history.  He brings in a video of her first thing in the AM and she ambulates well.  They are "adjusting down" and she is taking about 6 tabs per day.  PDMP indicates patient now off of alprazolam and on bid clonazepam.  She is exercising with PWR Moves classes at [facility].  She is having more proximal leg pain.  She has decreased her gabapentin as it was not really helping.  She is taking 400 mg twice per day.  She has increased ibuprofen to 400 mg twice per day.  Current prescribed movement disorder  medications: carbidopa/levodopa 25/100, 1 tablet every 2 hours starting at 6-7am (getting about 6-7 in per day) Pramipexole ER, 0.75 mg daily (compulsive behaviors at higher dosages) Cymbalta Gabapentin, 400 mg 3 times per day (taking bid now) Rozerem, 8 mg q hs  Inbrija (not using it very much now)   PREVIOUS MEDICATIONS: Mirapex and azilect; levodopa;; requip; amantadine (stopped for SE - ?what); clonazepam (taken off when primary care started alprazolam); rytary (didn't think it helped); Inbrija  ALLERGIES:   Allergies  Allergen Reactions   Other Anaphylaxis    "Anti Seizure Medications"   Phenergan [Promethazine Hcl] Other (See Comments)    Makes Parkinson worse   Sporanox [Itraconazole] Hives   Tylenol [Acetaminophen] Other (See Comments)    "I just get weird"    CURRENT MEDICATIONS:  Outpatient Encounter Medications as of 04/07/2022  Medication Sig   ALPRAZolam (XANAX) 0.25 MG tablet Take 0.25 mg by mouth daily as needed for anxiety.     aspirin 325 MG tablet Take 325-650 mg by mouth daily as needed (for headaches/pain.).    atorvastatin (LIPITOR) 10 MG tablet Take 10 mg by mouth in the morning.   carbidopa-levodopa (SINEMET IR) 25-100 MG tablet TAKE 1 TABLET EVERY 2 HOURSUP TO 8 TABLETS PER DAY   Cholecalciferol (VITAMIN D-3) 5000 units TABS Take 5,000 Units by mouth 2 (two) times daily.   clonazePAM (KLONOPIN) 0.5 MG tablet Take 0.5 mg by mouth 2 (two) times daily as needed.   Coenzyme Q10-Vitamin E (QUNOL ULTRA COQ10 PO) Take 1 capsule by mouth daily.   CYMBALTA 60 MG capsule TAKE 1 CAPSULE DAILY   gabapentin (NEURONTIN) 400 MG capsule TAKE 1 CAPSULE 3 TIMES A   DAY (Patient taking differently: Take 400 mg by mouth 3 (three) times daily as needed (pain).)   lisinopril (ZESTRIL) 2.5 MG tablet Take 2.5 mg by mouth once.   methocarbamol (ROBAXIN) 750 MG tablet Take 750 mg by mouth every 6 (six) hours as needed for muscle spasms.   Multiple Vitamins-Minerals (PRESERVISION AREDS 2 PO) Take 1 tablet by mouth 2 (two) times daily.   Polyethylene Glycol 400 (BLINK TEARS) 0.25 % SOLN Place 1 drop into both eyes daily as needed (dry eyes).   raloxifene (EVISTA) 60 MG tablet Take 60 mg by mouth daily.   ramelteon (ROZEREM) 8 MG tablet TAKE 1 TABLET AT BEDTIME   [DISCONTINUED] cephALEXin (KEFLEX) 250 MG capsule Take 1 capsule (250 mg total) by mouth at bedtime.   [DISCONTINUED] phenazopyridine (PYRIDIUM) 95 MG tablet Take 95 mg by mouth 3 (three) times daily as needed for pain.   lisinopril (ZESTRIL) 10 MG tablet TAKE 1 TABLET BY MOUTH EVERY DAY (Patient not taking: Reported on 04/07/2022)   lisinopril (ZESTRIL) 5 MG tablet Take 1 tablet (5 mg total) by mouth as needed (If systolic blood pressure less than 110, hold Lisinopril '10mg'$  and take Lisinopril '5mg'$ ). If systolic blood pressure less than 110, hold Lisinopril '10mg'$  and take Lisinopril '5mg'$  (Patient not taking: Reported on 04/07/2022)   No facility-administered encounter medications on file  as of 04/07/2022.    Objective:   PHYSICAL EXAMINATION:    VITALS:   Vitals:   04/07/22 1455 04/07/22 1503  BP: (!) 150/70 130/82  Pulse: 70   SpO2: 98%   Weight: 156 lb 9.6 oz (71 kg)   Height: '5\' 4"'$  (1.626 m)        GEN:  The patient appears stated age and is in NAD. HEENT:  Normocephalic, atraumatic.  The mucous  membranes are moist.  Neurological examination:  Orientation: The patient is alert and oriented x3. Cranial nerves: There is good facial symmetry with mild facial hypomimia. The speech is fluent and clear. Soft palate rises symmetrically and there is no tongue deviation. Hearing is intact to conversational tone. Sensation: Sensation is intact to light touch throughout Motor: Strength is at least antigravity x4.  Movement examination: Tone: There is mod increased tone on the L and mild to mod on the R Abnormal movements:no dyskinesia today Gait and Station: The patient pushes off to arise.  No freezing or start hesitation today, but she is short stepped and flexed at the knees.  She is somewhat unbalanced, but not quite as bad as when I saw last visit.  I have reviewed and interpreted the following labs independently    Chemistry      Component Value Date/Time   NA 139 12/14/2021 1327   K 3.6 12/14/2021 1327   CL 104 12/14/2021 1327   CO2 30 12/14/2021 1327   BUN 18 12/14/2021 1327   CREATININE 0.78 12/14/2021 1327      Component Value Date/Time   CALCIUM 8.9 12/14/2021 1327   ALKPHOS 85 10/18/2021 0547   AST 13 (L) 10/18/2021 0547   ALT 7 10/18/2021 0547   BILITOT 0.8 10/18/2021 0547       Lab Results  Component Value Date   WBC 7.5 12/14/2021   HGB 12.7 12/14/2021   HCT 39.8 12/14/2021   MCV 89.8 12/14/2021   PLT 252 12/14/2021    Lab Results  Component Value Date   TSH 1.400 10/18/2021     Total time spent on today's visit was 31 minutes, including both face-to-face time and nonface-to-face time.  Time included that spent on review of  records (prior notes available to me/labs/imaging if pertinent), discussing treatment and goals, answering patient's questions and coordinating care.  Cc:  [name], MD

## 2022-04-07 ENCOUNTER — Ambulatory Visit (INDEPENDENT_AMBULATORY_CARE_PROVIDER_SITE_OTHER): Payer: Medicare Other | Admitting: Neurology

## 2022-04-07 ENCOUNTER — Encounter: Payer: Self-pay | Admitting: Neurology

## 2022-04-07 VITALS — BP 130/82 | HR 70 | Ht 64.0 in | Wt 156.6 lb

## 2022-04-07 DIAGNOSIS — G20 Parkinson's disease: Secondary | ICD-10-CM

## 2022-04-07 DIAGNOSIS — G249 Dystonia, unspecified: Secondary | ICD-10-CM

## 2022-04-07 DIAGNOSIS — M79605 Pain in left leg: Secondary | ICD-10-CM | POA: Diagnosis not present

## 2022-04-07 DIAGNOSIS — M79604 Pain in right leg: Secondary | ICD-10-CM

## 2022-04-07 NOTE — Patient Instructions (Signed)
Local and Online Resources for Power over Parkinson's Group August 2023  LOCAL [place] PARKINSON'S GROUPS  Power over Parkinson's Group:   Power Over Parkinson's Patient Education Group will be Wednesday, August 9th-*Hybrid meting*- in person at [facility] location and via [facility] at 2:00 pm.   Upcoming Power over Parkinson's Meetings:  2nd Wednesdays of the month at 2 pm:  August 9th, September 13th, October 11th Contact Morgan [name] at [name] if interested in participating in this group Parkinson's Care Partners Group:    3rd Mondays, Contact Morgan [name] Atypical Parkinsonian Patient Group:   4th Wednesdays, Contact Morgan [name] If you are interested in participating in these groups with Morgan, please contact her directly for how to join those meetings.  Her contact information is [name].    LOCAL EVENTS AND NEW OFFERINGS Parkinson's T-shirts for sale!  Designed by a local group member, with funds going to [employer]. X-Large sizes available.  Contact Morgan to purchase  New PWR! Moves [employer] Instructor-Led Class offering at [employer]!  Wednesdays 1-2 pm, starting April 12th.   Contact Morgan [name] at [employer], [name]   Agency:  [email or web address] PD Health at Home continues:  Mindfulness Mondays, Wellness Wednesdays, Fitness Fridays  Upcoming Education:   Care Partners:  Why they Matter and why their Needs Matter.  Wednesday, August 9th 1-2 pm Invisible Symptoms:  NonMotor Symptoms.  Wednesday, August 16th 1-2 pm Expert Briefing:   Parkinson's Disease and the Bladder.  Wednesday, September 13th 1:00-2:00 pm Register for expert briefings (webinars) at September 26 Please check out their website to sign up for emails and see their full online  offerings   [place]:  [email or web address]  Third Thursday Webinars:  On the third Thursday of every month at 12 p.m. ET, join our free live webinars to learn about various aspects of living with Parkinson's disease and our work to speed medical breakthroughs. Upcoming Webinar: Too Much or Not Enough:  Dyskinesia and "off" time in Parkinson's (Replay).  Thursday, August 17th at 12 noon. Check out additional information on their website to see their full online offerings  [facility]:  [email or web address] Upcoming Webinar:   Stay tuned Webinar Series:  Living with Parkinson's Meetup.   Third Thursdays each month, 3 pm Care Partner Monthly Meetup.  With 10-13-1987 [place].  First Tuesday of each month, 2 pm Check out additional information to Live Well Today on their website  Parkinson and Movement Disorders (PMD) Alliance:  www.[email or web address] [employer] Online:  Online Education Events Sign up for emails, which are sent weekly to give you updates on programming and online offerings  Parkinson's Association of the [place]:  www.[email or web address] Information on online support groups, education events, and online exercises including Yoga, Parkinson's exercises and more-LOTS of information on links to PD resources and online events Virtual Support Group through Parkinson's Association of the [place]; next one is scheduled for Wednesday, September 6th at 2 pm. (These are typically scheduled for the 1st Wednesday of the month at 2 pm).  Visit website for details. Save the date for "Caring for Parkinson's-Caring for You", 9th Annual Symposium.  In-person event in [place].  September 9th.  More info on registration to come. MOVEMENT AND EXERCISE OPPORTUNITIES PWR! Moves Classes at [place].  Wednesdays 10 and 11 am.   Contact Morgan [name], [name] if interested. NEW PWR! Moves Class offering at 05-29-1994.  Wednesdays 1-2 pm, starting April 12th.  Contact Morgan [name] at [place], [name] Here is a link to the PWR!Moves classes on Zoom from [place] - Daily Mon-Sat at 10:00. Via Zoom, FREE and open to all.  There is also a link below via Facebook if you use that platform.  [email or web address]  Parkinson's Wellness Recovery (PWR! Moves)  www.[email or web address] Info on the PWR! Virtual Experience:  You will have access to our expertise through self-assessment, guided plans that start with the PD-specific fundamentals, educational content, tips, Q&A with an expert, and a growing Art therapist of PD-specific pre-recorded and live exercise classes of varying types and intensity - both physical and cognitive! If that is not enough, we offer 1:1 wellness consultations (in-person or virtual) to personalize your PWR! Research scientist (medical).  [place] Fridays:  As part of the PD Health @ Home program, this free video series focuses each week on one aspect of fitness designed to support people living with Parkinson's.  These weekly videos highlight the [place] recent fitness guidelines for people with Parkinson's disease. ModemGamers.si Dance for PD website is offering free, live-stream classes throughout the week, as well as links to [employer] of classes:  [email or web address] Virtual dance and Pilates for Parkinson's classes: Click on the Community Tab> Parkinson's Movement Initiative Tab.  To register for classes and for more information, visit [email or web address] and click the "community" tab.  [employer] Parkinson's Cycling Classes  [employer]:  Thursdays @ Noon-Live  classes at [employer] ([employer] at [email or web address] or [phone]) [employer]: Virtual Classes Mondays and Thursdays Jeanette Caprice classes Tuesday, Wednesday and Thursday (contact [place] at [email or web address]  or [phone]) [place] Varied levels of classes are offered Tuesdays and Thursdays at [facility].  Stretching with Verdis Frederickson weekly class is also offered for people with Parkinson's To observe a class or for more information, call [phone] or email Hezzie Bump at [email or web address] ADDITIONAL SUPPORT AND RESOURCES Well[employer]:Online Caregiver Education Opportunities:  www.well-[email or web address]/caregiver-education/[email or web address]-support-group.  You may also contact Vickki Muff at [email or web address] or [phone].    Well-Spring Navigator:  [phone] program, a free service to help individuals and families through the journey of determining care for older adults.  The "Navigator" is a [employer], Education officer, museum, who will speak with a prospective client and/or loved ones to provide an assessment of the situation and a set of recommendations for a personalized care plan -- all free of charge, and whether [employer] offers the needed service or not. If the need is not a service we provide, we are well-connected with reputable programs in town that we can refer you to.  www.well-springsolutions.org or to speak with the Navigator, call [phone].

## 2022-04-12 ENCOUNTER — Ambulatory Visit: Payer: Medicare Other | Admitting: Neurology

## 2022-04-14 ENCOUNTER — Ambulatory Visit: Payer: Medicare Other | Admitting: Neurology

## 2022-04-14 DIAGNOSIS — N3000 Acute cystitis without hematuria: Secondary | ICD-10-CM | POA: Diagnosis not present

## 2022-04-14 DIAGNOSIS — R030 Elevated blood-pressure reading, without diagnosis of hypertension: Secondary | ICD-10-CM | POA: Diagnosis not present

## 2022-04-14 DIAGNOSIS — R8290 Unspecified abnormal findings in urine: Secondary | ICD-10-CM | POA: Diagnosis not present

## 2022-04-25 DIAGNOSIS — H52203 Unspecified astigmatism, bilateral: Secondary | ICD-10-CM | POA: Diagnosis not present

## 2022-04-25 DIAGNOSIS — H2513 Age-related nuclear cataract, bilateral: Secondary | ICD-10-CM | POA: Diagnosis not present

## 2022-04-25 DIAGNOSIS — H5213 Myopia, bilateral: Secondary | ICD-10-CM | POA: Diagnosis not present

## 2022-04-25 DIAGNOSIS — H353132 Nonexudative age-related macular degeneration, bilateral, intermediate dry stage: Secondary | ICD-10-CM | POA: Diagnosis not present

## 2022-04-28 DIAGNOSIS — N390 Urinary tract infection, site not specified: Secondary | ICD-10-CM | POA: Diagnosis not present

## 2022-04-28 DIAGNOSIS — R8290 Unspecified abnormal findings in urine: Secondary | ICD-10-CM | POA: Diagnosis not present

## 2022-04-29 ENCOUNTER — Emergency Department (HOSPITAL_COMMUNITY): Payer: Medicare Other

## 2022-04-29 ENCOUNTER — Encounter (HOSPITAL_COMMUNITY): Payer: Self-pay

## 2022-04-29 ENCOUNTER — Emergency Department (HOSPITAL_COMMUNITY)
Admission: EM | Admit: 2022-04-29 | Discharge: 2022-04-29 | Disposition: A | Discharge status: Home / Self Care | Payer: Medicare Other | Attending: Emergency Medicine | Admitting: Emergency Medicine

## 2022-04-29 ENCOUNTER — Other Ambulatory Visit: Payer: Self-pay

## 2022-04-29 DIAGNOSIS — M419 Scoliosis, unspecified: Secondary | ICD-10-CM | POA: Diagnosis not present

## 2022-04-29 DIAGNOSIS — S6991XA Unspecified injury of right wrist, hand and finger(s), initial encounter: Secondary | ICD-10-CM

## 2022-04-29 DIAGNOSIS — W1830XA Fall on same level, unspecified, initial encounter: Secondary | ICD-10-CM | POA: Diagnosis not present

## 2022-04-29 DIAGNOSIS — Z87440 Personal history of urinary (tract) infections: Secondary | ICD-10-CM | POA: Insufficient documentation

## 2022-04-29 DIAGNOSIS — S63286A Dislocation of proximal interphalangeal joint of right little finger, initial encounter: Secondary | ICD-10-CM | POA: Diagnosis not present

## 2022-04-29 DIAGNOSIS — Z7982 Long term (current) use of aspirin: Secondary | ICD-10-CM | POA: Insufficient documentation

## 2022-04-29 DIAGNOSIS — S80211A Abrasion, right knee, initial encounter: Secondary | ICD-10-CM | POA: Diagnosis not present

## 2022-04-29 DIAGNOSIS — Z87442 Personal history of urinary calculi: Secondary | ICD-10-CM | POA: Diagnosis not present

## 2022-04-29 DIAGNOSIS — I771 Stricture of artery: Secondary | ICD-10-CM | POA: Diagnosis not present

## 2022-04-29 DIAGNOSIS — W19XXXA Unspecified fall, initial encounter: Secondary | ICD-10-CM

## 2022-04-29 DIAGNOSIS — Y92002 Bathroom of unspecified non-institutional (private) residence single-family (private) house as the place of occurrence of the external cause: Secondary | ICD-10-CM | POA: Diagnosis not present

## 2022-04-29 DIAGNOSIS — M7989 Other specified soft tissue disorders: Secondary | ICD-10-CM | POA: Diagnosis not present

## 2022-04-29 DIAGNOSIS — K8020 Calculus of gallbladder without cholecystitis without obstruction: Secondary | ICD-10-CM | POA: Insufficient documentation

## 2022-04-29 DIAGNOSIS — R350 Frequency of micturition: Secondary | ICD-10-CM | POA: Diagnosis not present

## 2022-04-29 DIAGNOSIS — G20 Parkinson's disease: Secondary | ICD-10-CM | POA: Insufficient documentation

## 2022-04-29 DIAGNOSIS — S8991XA Unspecified injury of right lower leg, initial encounter: Secondary | ICD-10-CM | POA: Diagnosis present

## 2022-04-29 LAB — CBC
HCT: 41.8 % (ref 36.0–46.0)
Hemoglobin: 13.3 g/dL (ref 12.0–15.0)
MCH: 29.1 pg (ref 26.0–34.0)
MCHC: 31.8 g/dL (ref 30.0–36.0)
MCV: 91.5 fL (ref 80.0–100.0)
Platelets: 265 10*3/uL (ref 150–400)
RBC: 4.57 MIL/uL (ref 3.87–5.11)
RDW: 13.7 % (ref 11.5–15.5)
WBC: 7.5 10*3/uL (ref 4.0–10.5)
nRBC: 0 % (ref 0.0–0.2)

## 2022-04-29 LAB — BASIC METABOLIC PANEL
Anion gap: 7 (ref 5–15)
BUN: 15 mg/dL (ref 8–23)
CO2: 31 mmol/L (ref 22–32)
Calcium: 9.1 mg/dL (ref 8.9–10.3)
Chloride: 102 mmol/L (ref 98–111)
Creatinine, Ser: 0.87 mg/dL (ref 0.44–1.00)
GFR, Estimated: >60 mL/min (ref >60)
Glucose, Bld: 103 mg/dL — ABNORMAL HIGH (ref 70–99)
Potassium: 3.6 mmol/L (ref 3.5–5.1)
Sodium: 140 mmol/L (ref 135–145)

## 2022-04-29 LAB — URINALYSIS, ROUTINE W REFLEX MICROSCOPIC
Bilirubin Urine: NEGATIVE
Glucose, UA: NEGATIVE mg/dL
Hgb urine dipstick: NEGATIVE
Ketones, ur: 5 mg/dL — AB
Leukocytes,Ua: NEGATIVE
Nitrite: NEGATIVE
Protein, ur: NEGATIVE mg/dL
Specific Gravity, Urine: 1.01 (ref 1.005–1.030)
pH: 7 (ref 5.0–8.0)

## 2022-04-29 NOTE — Discharge Instructions (Addendum)
Please keep finger in splint for one week.  Recheck with your doctor and you may remove the splint if finger is not hurting at that time. Please use acetaminophen, warm and cold as needed for pain. Return if you are having any new or worsening symptoms. Urinary tract infection appears to have resolved There is no evidence of kidney stone seen on your CAT scan There is a area that appears to be slightly larger from before that could represent a fibroid tumor but a abnormal mass is not excluded.  This will need to be followed up with your primary care doctor and a pelvic ultrasound.

## 2022-04-29 NOTE — ED Triage Notes (Signed)
Patient was at [facility] for a follow up appointment. Patient is currently on antibiotics for a UTI. Patient was being seen to see if she had a kidney stone. The patient went to the bathroom and fell, dislocating her right pinky. Patient states the physician and [facility] put the finger back into place and buddy=taped the finger. Patient denies hitting her head or having LOC.

## 2022-04-29 NOTE — ED Provider Notes (Addendum)
[facility] DEPT Provider Note   CSN: [id number] Arrival date & time: 04/29/22  0254     History  Chief Complaint  Patient presents with  . Fall  . Finger Injury  . Urinary Frequency    Morgan [name] is a 71 y.o. female.  71 year old female history of Parkinson's presents today after mechanical fall.  Patient states she was at [facility] urology.  She went into the bathroom there and tripped or lost her balance.  She states that she did not get lightheaded and did not lose consciousness.  She has Parkinson's and sometimes has trouble with her balance.  He fell into a seated position and fell backwards.  She denies striking her head.  She is not on any blood thinners.  She states that her right pinky finger was dislocated and the doctor at the [facility] urology center put it back.  She has some pain in that area.  She denies of any other pain or injuries. Plan to discharge.  However, patient states that she was told at the urology office that she would have evaluation for kidney stone here.  Patient and husband state that she has had a UTI and been on Macrodantin since mid August.  She was seen at her primary care provider yesterday.  Her husband feels that she is having some symptoms consistent with her prior kidney stone.  States that she did not have pain with this before.  He states that when she came into the emergency department she had urinary tract infection and sepsis with kidney stone.  He had noted at that time that she just seemed to be a little bit more confused and writhing around more than usual.  He has not noted confusion in the past couple days but has noted that she has been writhing around apparently more than usual.  She does not complain of lateralized pain.  After discussion with patient and husband, will obtain urinalysis, CT abdomen, and labs       Home Medications Prior to Admission medications   Medication Sig Start Date End Date  Taking? Authorizing Provider  ALPRAZolam Duanne Moron) 0.25 MG tablet Take 0.25 mg by mouth daily as needed for anxiety.     [provider]  aspirin 325 MG tablet Take 325-650 mg by mouth daily as needed (for headaches/pain.).     [provider]  atorvastatin (LIPITOR) 10 MG tablet Take 10 mg by mouth in the morning.    [provider]  carbidopa-levodopa (SINEMET IR) 25-100 MG tablet TAKE 1 TABLET EVERY 2 HOURSUP TO 8 TABLETS PER DAY 03/28/22   [name], Eustace Quail, DO  Cholecalciferol (VITAMIN D-3) 5000 units TABS Take 5,000 Units by mouth 2 (two) times daily.    [provider]  clonazePAM (KLONOPIN) 0.5 MG tablet Take 0.5 mg by mouth 2 (two) times daily as needed. 01/10/22   [provider]  Coenzyme Q10-Vitamin E (QUNOL ULTRA COQ10 PO) Take 1 capsule by mouth daily.    [provider]  CYMBALTA 60 MG capsule TAKE 1 CAPSULE DAILY 03/28/22   [name], Eustace Quail, DO  gabapentin (NEURONTIN) 400 MG capsule TAKE 1 CAPSULE 3 TIMES A   DAY Patient taking differently: Take 400 mg by mouth 3 (three) times daily as needed (pain). 11/16/21   [name], Eustace Quail, DO  lisinopril (ZESTRIL) 10 MG tablet TAKE 1 TABLET BY MOUTH EVERY DAY Patient not taking: Reported on 04/07/2022 01/31/22   Loel Dubonnet, NP  lisinopril (ZESTRIL) 2.5 MG tablet Take 2.5 mg by mouth once. 04/05/22   [provider]  lisinopril (ZESTRIL) 5 MG tablet Take 1 tablet (5 mg total) by mouth as needed (If systolic blood pressure less than 110, hold Lisinopril '10mg'$  and take Lisinopril '5mg'$ ). If systolic blood pressure less than 110, hold Lisinopril '10mg'$  and take Lisinopril '5mg'$  Patient not taking: Reported on 04/07/2022 11/17/21   Loel Dubonnet, NP  methocarbamol (ROBAXIN) 750 MG tablet Take 750 mg by mouth every 6 (six) hours as needed for muscle spasms. 03/25/20   [provider]  Multiple Vitamins-Minerals (PRESERVISION AREDS 2 PO) Take 1 tablet by mouth 2 (two) times daily.    [provider]  Polyethylene Glycol 400 (BLINK TEARS) 0.25 % SOLN Place 1 drop into both eyes daily as needed (dry eyes).    [provider]  raloxifene (EVISTA) 60 MG tablet Take 60 mg by mouth daily.    [provider]  ramelteon (ROZEREM) 8 MG tablet TAKE 1 TABLET AT BEDTIME 03/28/22   [name], Eustace Quail, DO      Allergies    Other, Phenergan [promethazine hcl], Sporanox [itraconazole], and Tylenol [acetaminophen]    Review of Systems   Review of Systems  Physical Exam Updated Vital Signs BP (!) 170/88 (BP Location: Left Arm)   Pulse 81   Temp 98.3 F (36.8 C) (Oral)   Resp 16   Ht 1.651 m ('5\' 5"'$ )   Wt 70.8 kg   SpO2 93%   BMI 25.96 kg/m  Physical Exam Vitals reviewed.  Constitutional:      General: She is not in acute distress.    Appearance: Normal appearance.  HENT:     Head: Normocephalic.     Right Ear: Tympanic membrane and external ear normal.     Left Ear: Tympanic membrane and external ear normal.     Nose: Nose normal.     Mouth/Throat:     Mouth: Mucous membranes are moist.     Pharynx: Oropharynx is clear.  Eyes:     Extraocular Movements: Extraocular movements intact.     Conjunctiva/sclera: Conjunctivae normal.     Pupils: Pupils are equal, round, and reactive to light.  Cardiovascular:     Rate and Rhythm: Normal rate and regular rhythm.     Pulses: Normal pulses.     Heart sounds: Normal heart sounds.  Pulmonary:     Effort: Pulmonary effort is normal.     Breath sounds: Normal breath sounds.     Comments: No external signs of trauma on her chest wall No tenderness palpation no crepitus noted Abdominal:     General: Bowel sounds are normal. There is no distension.     Palpations: Abdomen is soft.     Tenderness: There is no abdominal tenderness.  Musculoskeletal:        General: Normal range of motion.     Cervical back: Normal range of motion and neck supple. No tenderness.     Comments: Pelvis stable Abrasion to right  knee Right fifth finger with mild varus deformity at the PIP joint some tenderness from the PIP joint to the DIP joint No proximal tenderness or swelling noted Sensation intact throughout extremities Thoracic and lumbar spine visually inspected without external signs of trauma and no tenderness palpation Full active range of motion bilateral hips, shoulders, knees  Skin:    General: Skin is warm and dry.     Capillary Refill: Capillary refill takes less  than 2 seconds.  Neurological:     General: No focal deficit present.     Mental Status: She is alert. Mental status is at baseline.     Cranial Nerves: No cranial nerve deficit.     Motor: No weakness.  Psychiatric:        Mood and Affect: Mood normal.        Behavior: Behavior normal.     ED Results / Procedures / Treatments   Labs (all labs ordered are listed, but only abnormal results are displayed) Labs Reviewed - No data to display  EKG None  Radiology DG Finger Little Right  Result Date: 04/29/2022 CLINICAL DATA:  Dislocated RIGHT pinky EXAM: RIGHT LITTLE FINGER 2+V COMPARISON:  None Available. FINDINGS: No fracture or dislocation of the fifth digit. Soft tissue swelling at the PIP. IMPRESSION: No fracture or dislocation. Electronically Signed   By: Suzy Bouchard M.D.   On: 04/29/2022 10:03    Procedures Procedures    Medications Ordered in ED Medications - No data to display  ED Course/ Medical Decision Making/ A&P Clinical Course as of 04/29/22 1309  Fri Apr 29, 2022  1007 X-Mina Carlisi reviewed of right little finger with alignment normal and no obvious fracture and radiologist interpretation states no fracture or dislocation [DR]  0272 Basic metabolic panel and CBC reviewed interpreted and within normal limits [DR]  5366 CT reviewed and interpreted with no evidence of acute abnormality noted on my review and radiologist interpretation noted with stable cholelithiasis, resolution of previously noted distal left  ureteral calculus, oval-shaped soft tissue in the region of the left pelvis adnexal area may be slightly larger compared to prior study and recommend outpatient ultrasound [DR]  1309 Urinalysis, Routine w reflex microscopic Urine, Clean Catch(!) [DR]    Clinical Course User Index [DR] Pattricia Boss, MD                           Medical Decision Making 71 year old female history of Parkinson's presents today after mechanical fall at doctor's office.  Per her report, she had a dislocation of her finger which the physician at the office reduced.  She does not report any other pain or injury.  She reports no lightheadedness and no loss of consciousness and is not on blood thinners.  I have a low index of suspicion for syncope, seizure, or etiology other than mechanical fall.  Patient was examined and revealed no other signs of trauma with the exception of an abrasion to her right knee and the previously mentioned finger dislocation.  There is currently no obvious dislocation there is slight swelling and tenderness of the finger.  Sensation, movement, and circulation are intact.  There are no open wounds. Patient was evaluated with x-rays of the right hand.  There is no obvious signs of fracture. Plan splinting of the finger. Patient will be discharged home She is advised regarding return precautions and need for follow-up and voices understanding.  Amount and/or Complexity of Data Reviewed Labs: ordered. Radiology: ordered and independent interpretation performed. Decision-making details documented in ED Course.           Final Clinical Impression(s) / ED Diagnoses Final diagnoses:  Fall, initial encounter  Injury of finger of right hand, initial encounter  Abrasion of right knee, initial encounter  Parkinson disease ([place])    Rx / DC Orders ED Discharge Orders     None  Pattricia Boss, MD 04/29/22 1012    Pattricia Boss, MD 04/29/22 1311

## 2022-05-04 ENCOUNTER — Other Ambulatory Visit (HOSPITAL_BASED_OUTPATIENT_CLINIC_OR_DEPARTMENT_OTHER): Payer: Self-pay | Admitting: Family

## 2022-05-04 NOTE — Telephone Encounter (Signed)
Pt of Dr. Croitoru. Please review for refill. Thank you! 

## 2022-05-16 ENCOUNTER — Emergency Department (HOSPITAL_COMMUNITY): Payer: Medicare Other

## 2022-05-16 ENCOUNTER — Observation Stay (HOSPITAL_COMMUNITY)
Admission: EM | Admit: 2022-05-16 | Discharge: 2022-05-18 | Disposition: A | Discharge status: Home Health | Payer: Medicare Other | Attending: Internal Medicine | Admitting: Internal Medicine

## 2022-05-16 ENCOUNTER — Other Ambulatory Visit: Payer: Self-pay

## 2022-05-16 DIAGNOSIS — U071 COVID-19: Secondary | ICD-10-CM | POA: Diagnosis not present

## 2022-05-16 DIAGNOSIS — E876 Hypokalemia: Secondary | ICD-10-CM | POA: Diagnosis not present

## 2022-05-16 DIAGNOSIS — Z87891 Personal history of nicotine dependence: Secondary | ICD-10-CM | POA: Diagnosis not present

## 2022-05-16 DIAGNOSIS — Z85820 Personal history of malignant melanoma of skin: Secondary | ICD-10-CM | POA: Insufficient documentation

## 2022-05-16 DIAGNOSIS — R059 Cough, unspecified: Secondary | ICD-10-CM | POA: Diagnosis not present

## 2022-05-16 DIAGNOSIS — R4182 Altered mental status, unspecified: Secondary | ICD-10-CM | POA: Diagnosis not present

## 2022-05-16 DIAGNOSIS — Z79899 Other long term (current) drug therapy: Secondary | ICD-10-CM | POA: Diagnosis not present

## 2022-05-16 DIAGNOSIS — R531 Weakness: Secondary | ICD-10-CM

## 2022-05-16 DIAGNOSIS — I10 Essential (primary) hypertension: Secondary | ICD-10-CM | POA: Diagnosis present

## 2022-05-16 DIAGNOSIS — D649 Anemia, unspecified: Secondary | ICD-10-CM | POA: Insufficient documentation

## 2022-05-16 DIAGNOSIS — G20A1 Parkinson's disease without dyskinesia, without mention of fluctuations: Secondary | ICD-10-CM | POA: Diagnosis present

## 2022-05-16 DIAGNOSIS — G20 Parkinson's disease: Secondary | ICD-10-CM | POA: Insufficient documentation

## 2022-05-16 LAB — CBC WITH DIFFERENTIAL/PLATELET
Abs Immature Granulocytes: 0.06 10*3/uL (ref 0.00–0.07)
Basophils Absolute: 0 10*3/uL (ref 0.0–0.1)
Basophils Relative: 0 %
Eosinophils Absolute: 0.2 10*3/uL (ref 0.0–0.5)
Eosinophils Relative: 1 %
HCT: 35.9 % — ABNORMAL LOW (ref 36.0–46.0)
Hemoglobin: 11.4 g/dL — ABNORMAL LOW (ref 12.0–15.0)
Immature Granulocytes: 1 %
Lymphocytes Relative: 11 %
Lymphs Abs: 1.4 10*3/uL (ref 0.7–4.0)
MCH: 29.2 pg (ref 26.0–34.0)
MCHC: 31.8 g/dL (ref 30.0–36.0)
MCV: 91.8 fL (ref 80.0–100.0)
Monocytes Absolute: 0.6 10*3/uL (ref 0.1–1.0)
Monocytes Relative: 4 %
Neutro Abs: 10.6 10*3/uL — ABNORMAL HIGH (ref 1.7–7.7)
Neutrophils Relative %: 83 %
Platelets: 200 10*3/uL (ref 150–400)
RBC: 3.91 MIL/uL (ref 3.87–5.11)
RDW: 14.4 % (ref 11.5–15.5)
WBC: 12.8 10*3/uL — ABNORMAL HIGH (ref 4.0–10.5)
nRBC: 0 % (ref 0.0–0.2)

## 2022-05-16 LAB — URINALYSIS, ROUTINE W REFLEX MICROSCOPIC
Bacteria, UA: NONE SEEN
Bilirubin Urine: NEGATIVE
Glucose, UA: NEGATIVE mg/dL
Hgb urine dipstick: NEGATIVE
Ketones, ur: 5 mg/dL — AB
Nitrite: NEGATIVE
Protein, ur: NEGATIVE mg/dL
Specific Gravity, Urine: 1.013 (ref 1.005–1.030)
pH: 6 (ref 5.0–8.0)

## 2022-05-16 LAB — COMPREHENSIVE METABOLIC PANEL
ALT: 6 U/L (ref 0–44)
AST: 14 U/L — ABNORMAL LOW (ref 15–41)
Albumin: 3.4 g/dL — ABNORMAL LOW (ref 3.5–5.0)
Alkaline Phosphatase: 85 U/L (ref 38–126)
Anion gap: 7 (ref 5–15)
BUN: 16 mg/dL (ref 8–23)
CO2: 27 mmol/L (ref 22–32)
Calcium: 8.4 mg/dL — ABNORMAL LOW (ref 8.9–10.3)
Chloride: 105 mmol/L (ref 98–111)
Creatinine, Ser: 0.76 mg/dL (ref 0.44–1.00)
GFR, Estimated: >60 mL/min (ref >60)
Glucose, Bld: 102 mg/dL — ABNORMAL HIGH (ref 70–99)
Potassium: 3.7 mmol/L (ref 3.5–5.1)
Sodium: 139 mmol/L (ref 135–145)
Total Bilirubin: 0.7 mg/dL (ref 0.3–1.2)
Total Protein: 6.5 g/dL (ref 6.5–8.1)

## 2022-05-16 LAB — RESP PANEL BY RT-PCR (FLU A&B, COVID) ARPGX2
Influenza A by PCR: NEGATIVE
Influenza B by PCR: NEGATIVE
SARS Coronavirus 2 by RT PCR: POSITIVE — AB

## 2022-05-16 LAB — LACTIC ACID, PLASMA: Lactic Acid, Venous: 0.7 mmol/L (ref 0.5–1.9)

## 2022-05-16 LAB — TROPONIN I (HIGH SENSITIVITY)
Troponin I (High Sensitivity): 6 ng/L (ref <18)
Troponin I (High Sensitivity): 5 ng/L (ref <18)

## 2022-05-16 LAB — CBG MONITORING, ED: Glucose-Capillary: 108 mg/dL — ABNORMAL HIGH (ref 70–99)

## 2022-05-16 MED ORDER — GABAPENTIN 400 MG PO CAPS
400.0000 mg | ORAL_CAPSULE | Freq: Three times a day (TID) | ORAL | Status: DC | PRN
  Administered 2022-05-17: 400 mg via ORAL
  Filled 2022-05-16: qty 1

## 2022-05-16 MED ORDER — ENOXAPARIN SODIUM 40 MG/0.4ML IJ SOSY
40.0000 mg | PREFILLED_SYRINGE | INTRAMUSCULAR | Status: DC
  Administered 2022-05-17 – 2022-05-18 (×2): 40 mg via SUBCUTANEOUS
  Filled 2022-05-16 (×2): qty 0.4

## 2022-05-16 MED ORDER — ONDANSETRON HCL 4 MG/2ML IJ SOLN: 4.0000 mg | Freq: Four times a day (QID) | INTRAMUSCULAR | Status: DC | PRN

## 2022-05-16 MED ORDER — DULOXETINE HCL 30 MG PO CPEP
60.0000 mg | ORAL_CAPSULE | Freq: Every day | ORAL | Status: DC
  Administered 2022-05-17 – 2022-05-18 (×2): 60 mg via ORAL
  Filled 2022-05-16 (×2): qty 2

## 2022-05-16 MED ORDER — CARBIDOPA-LEVODOPA 25-100 MG PO TABS
1.0000 | ORAL_TABLET | Freq: Every evening | ORAL | Status: DC | PRN
  Administered 2022-05-17: 1 via ORAL
  Filled 2022-05-16 (×2): qty 1

## 2022-05-16 MED ORDER — POLYVINYL ALCOHOL 1.4 % OP SOLN: 1.0000 [drp] | Freq: Three times a day (TID) | OPHTHALMIC | Status: DC | PRN

## 2022-05-16 MED ORDER — ASPIRIN 81 MG PO TBEC
81.0000 mg | DELAYED_RELEASE_TABLET | Freq: Every day | ORAL | Status: DC
  Administered 2022-05-17 – 2022-05-18 (×2): 81 mg via ORAL
  Filled 2022-05-16 (×2): qty 1

## 2022-05-16 MED ORDER — SODIUM CHLORIDE 0.9 % IV SOLN
1.0000 g | Freq: Once | INTRAVENOUS | Status: AC
  Administered 2022-05-16: 1 g via INTRAVENOUS
  Filled 2022-05-16: qty 10

## 2022-05-16 MED ORDER — LISINOPRIL 10 MG PO TABS
10.0000 mg | ORAL_TABLET | Freq: Every day | ORAL | Status: DC
  Administered 2022-05-17 – 2022-05-18 (×2): 10 mg via ORAL
  Filled 2022-05-16 (×2): qty 1

## 2022-05-16 MED ORDER — MOLNUPIRAVIR EUA 200MG CAPSULE
4.0000 | ORAL_CAPSULE | Freq: Two times a day (BID) | ORAL | Status: DC
  Administered 2022-05-17 – 2022-05-18 (×4): 800 mg via ORAL
  Filled 2022-05-16 (×2): qty 4

## 2022-05-16 MED ORDER — ONDANSETRON HCL 4 MG PO TABS: 4.0000 mg | ORAL_TABLET | Freq: Four times a day (QID) | ORAL | Status: DC | PRN

## 2022-05-16 MED ORDER — RAMELTEON 8 MG PO TABS: 8.0000 mg | ORAL_TABLET | Freq: Every evening | ORAL | Status: DC | PRN

## 2022-05-16 MED ORDER — SODIUM CHLORIDE 0.9 % IV SOLN
500.0000 mg | Freq: Once | INTRAVENOUS | Status: AC
  Administered 2022-05-16: 500 mg via INTRAVENOUS
  Filled 2022-05-16: qty 5

## 2022-05-16 MED ORDER — RALOXIFENE HCL 60 MG PO TABS
60.0000 mg | ORAL_TABLET | Freq: Every day | ORAL | Status: DC
  Administered 2022-05-17 – 2022-05-18 (×2): 60 mg via ORAL
  Filled 2022-05-16 (×2): qty 1

## 2022-05-16 MED ORDER — ACETAMINOPHEN 325 MG PO TABS
650.0000 mg | ORAL_TABLET | Freq: Once | ORAL | Status: AC
  Administered 2022-05-16: 650 mg via ORAL
  Filled 2022-05-16: qty 2

## 2022-05-16 MED ORDER — SODIUM CHLORIDE 0.9 % IV BOLUS
500.0000 mL | Freq: Once | INTRAVENOUS | Status: AC
  Administered 2022-05-16: 500 mL via INTRAVENOUS

## 2022-05-16 MED ORDER — CARBIDOPA-LEVODOPA 25-100 MG PO TABS
1.0000 | ORAL_TABLET | ORAL | Status: DC
  Administered 2022-05-17 – 2022-05-18 (×13): 1 via ORAL
  Filled 2022-05-16 (×15): qty 1

## 2022-05-16 MED ORDER — ATORVASTATIN CALCIUM 10 MG PO TABS
10.0000 mg | ORAL_TABLET | Freq: Every day | ORAL | Status: DC
  Administered 2022-05-17 – 2022-05-18 (×2): 10 mg via ORAL
  Filled 2022-05-16 (×2): qty 1

## 2022-05-16 NOTE — Assessment & Plan Note (Addendum)
COVID-19, No O2 requirement at this time 1. COVID pathway 2. Offered paxlovid but husband doesn't want [place] to use that med.  Will offer molnupiravir 1. No O2 requirement so doesn't qualify for remdesivir at this time. 2. Monoclonal ABs no longer authorized by the FDA. 3. Daily labs 4. Check CRP, D.dimer, procalcitonin 1. 1 dose of rocephin + azithro in ED

## 2022-05-16 NOTE — ED Notes (Signed)
Pt unable to stand independently without tipping over. MD made aware.

## 2022-05-16 NOTE — H&P (Signed)
History and Physical    Patient: Morgan [name] PYK:[id number] DOB: 09/01/1950 DOA: 05/16/2022 DOS: the patient was seen and examined on 05/16/2022 PCP: [name], MD  Patient coming from: Home  Chief Complaint:  Chief Complaint  Patient presents with   Altered Mental Status   HPI: Morgan [name] is a 71 y.o. female with medical history significant of Parkinson's dz, HTN, HLD.  Pt came home yesterday from cruise, wasn't acting normal with cough and intermittent confusion over past 3 days.  Subjective fevers and chills.  Unable to ambulate due to worsening of parkinson's symptoms.  Cough productive of sputum.  In ED:  Pt alert and oriented in ED, but unable to ambulate due to severity of parkinson's symptoms.  Found to be COVID-19 positive (presumably from cruise exposure).  Review of Systems: As mentioned in the history of present illness. All other systems reviewed and are negative. Past Medical History:  Diagnosis Date   Anxiety    Fibromyalgia    Headache    Hypercholesteremia    controlled on medication   Hyperlipidemia    Hypertension    no current meds   LBBB (left bundle branch block)    Melanoma (HCC)    MVP (mitral valve prolapse)    Osteopenia    Parkinson disease (HCC)    Pneumonia    SVD (spontaneous vaginal delivery)    x 2   Past Surgical History:  Procedure Laterality Date   BLEPHAROPLASTY     cold knife conization     COLONOSCOPY     FH colon CA   CYSTOSCOPY W/ URETERAL STENT PLACEMENT Left 10/18/2021   Procedure: CYSTOSCOPY WITH RETROGRADE PYELOGRAM, URETERAL STENT PLACEMENT;  Surgeon: Festus Aloe, MD;  Location: [facility];  Service: Urology;  Laterality: Left;   DILATATION & CURETTAGE/HYSTEROSCOPY WITH MYOSURE N/A 02/14/2018   Procedure: ATTEMPTED DILATATION & CURETTAGE/HYSTEROSCOPY WITH MYOSURE;  Surgeon: Christophe Louis, MD;  Location: [facility];  Service: Gynecology;  Laterality: N/A;  polyp   melanoma cancer     removed from left  leg   RECONSTRUCTION OF EYELID     TONSILLECTOMY     TUBAL LIGATION     URETEROSCOPY WITH HOLMIUM LASER LITHOTRIPSY Left 12/17/2021   Procedure: URETEROSCOPY/HOLMIUM LASER/STENT EXCHANGE;  Surgeon: Festus Aloe, MD;  Location: [facility];  Service: Urology;  Laterality: Left;   WISDOM TOOTH EXTRACTION     Social History:  reports that she quit smoking about 24 years ago. Her smoking use included cigarettes. She smoked an average of .25 packs per day. She has never used smokeless tobacco. She reports current alcohol use. She reports that she does not use drugs.  Allergies  Allergen Reactions   Other Anaphylaxis    "Anti Seizure Medications"   Phenergan [Promethazine Hcl] Other (See Comments)    Makes Parkinson's worse   Acetaminophen Other (See Comments)    "I just get weird"   Itraconazole Hives   Tamsulosin Other (See Comments)    B/P DROPPED TOO MUCH!!    Family History  Problem Relation Age of Onset   Cancer Mother        colon   Healthy Brother    Healthy Sister    Healthy Child     Prior to Admission medications   Medication Sig Start Date End Date Taking? Authorizing Provider  ALPRAZolam Duanne Moron) 0.25 MG tablet Take 0.25 mg by mouth daily as needed for anxiety.    Yes [provider]  aspirin 325 MG tablet Take  325-650 mg by mouth daily as needed for headache or mild pain.   Yes [provider]  aspirin EC 81 MG tablet Take 81 mg by mouth daily. Swallow whole.   Yes [provider]  atorvastatin (LIPITOR) 10 MG tablet Take 10 mg by mouth in the morning.   Yes [provider]  BLINK TEARS 0.25 % SOLN Place 1 drop into both eyes 3 (three) times daily as needed (for dryness).   Yes [provider]  carbidopa-levodopa (SINEMET IR) 25-100 MG tablet TAKE 1 TABLET EVERY 2 HOURSUP TO 8 TABLETS PER DAY Patient taking differently: Take 1 tablet by mouth See admin instructions. Take 1 tablet by mouth at 6 AM, 8 AM, 10 AM, 12 NOON, 2 PM, 4  PM, 6 PM, and an additional 1 tablet at bedtime as needed for symptoms 03/28/22  Yes [name], Eustace Quail, DO  Cholecalciferol (VITAMIN D3) 25 MCG (1000 UT) CHEW Chew 2,500 Units by mouth daily.   Yes [provider]  CINNAMON PO Take 1 capsule by mouth in the morning.   Yes [provider]  clonazePAM (KLONOPIN) 0.5 MG tablet Take 0.5 mg by mouth 2 (two) times daily as needed for anxiety. 01/10/22  Yes [provider]  Coenzyme Q10-Vitamin E (QUNOL ULTRA COQ10 PO) Take 1 capsule by mouth daily.   Yes [provider]  CYMBALTA 60 MG capsule TAKE 1 CAPSULE DAILY Patient taking differently: Take 60 mg by mouth in the morning. 03/28/22  Yes [name], Eustace Quail, DO  gabapentin (NEURONTIN) 400 MG capsule TAKE 1 CAPSULE 3 TIMES A   DAY Patient taking differently: Take 400 mg by mouth 3 (three) times daily as needed (for leg pain or soreness). 11/16/21  Yes [name], Eustace Quail, DO  Ibuprofen 200 MG CAPS Take 400 mg by mouth every 8 (eight) hours as needed (for general of leg pain).   Yes [provider]  lisinopril (ZESTRIL) 10 MG tablet TAKE 1 TABLET BY MOUTH EVERY DAY Patient taking differently: Take 10 mg by mouth in the morning. 01/31/22  Yes Loel Dubonnet, NP  methocarbamol (ROBAXIN) 750 MG tablet Take 750 mg by mouth every 6 (six) hours as needed for muscle spasms (or leg pain). 03/25/20  Yes [provider]  Multiple Vitamins-Minerals (PRESERVISION AREDS 2 PO) Take 1 capsule by mouth 2 (two) times daily.   Yes [provider]  raloxifene (EVISTA) 60 MG tablet Take 60 mg by mouth in the morning.   Yes [provider]  ramelteon (ROZEREM) 8 MG tablet TAKE 1 TABLET AT BEDTIME Patient taking differently: Take 8 mg by mouth at bedtime as needed for sleep. 03/28/22  Yes [name], Eustace Quail, DO    Physical Exam: Vitals:   05/16/22 2030 05/16/22 2045 05/16/22 2130 05/16/22 2200  BP: 134/77 137/77  131/81  Pulse:  87  95  Resp: (!) 22 20 (!) 24 20  Temp:       TempSrc:      SpO2:  92%  92%  Weight:      Height:       Constitutional: NAD, calm, comfortable Eyes: PERRL, lids and conjunctivae normal ENMT: Mucous membranes are moist. Posterior pharynx clear of any exudate or lesions.Normal dentition.  Neck: normal, supple, no masses, no thyromegaly Respiratory: Wet sounding cough present Cardiovascular: Regular rate and rhythm, no murmurs / rubs / gallops. No extremity edema. 2+ pedal pulses. No carotid bruits.  Abdomen: no tenderness, no masses palpated. No hepatosplenomegaly. Bowel  sounds positive.  Musculoskeletal: no clubbing / cyanosis. No joint deformity upper and lower extremities. Good ROM, no contractures. Normal muscle tone.  Skin: no rashes, lesions, ulcers. No induration Neurologic: Uncontrolled movements of arms and legs. Psychiatric: Normal judgment and insight. Alert and oriented x 3. Normal mood.   Data Reviewed:    COVID positive  CXR neg for acute findings     Latest Ref Rng & Units 05/16/2022    3:45 PM 04/29/2022   11:14 AM 12/14/2021    1:27 PM  CBC  WBC 4.0 - 10.5 K/uL 12.8  7.5  7.5   Hemoglobin 12.0 - 15.0 g/dL 11.4  13.3  12.7   Hematocrit 36.0 - 46.0 % 35.9  41.8  39.8   Platelets 150 - 400 K/uL 200  265  252       Latest Ref Rng & Units 05/16/2022    3:45 PM 04/29/2022   11:14 AM 12/14/2021    1:27 PM  CMP  Glucose 70 - 99 mg/dL 102  103  96   BUN 8 - 23 mg/dL '16  15  18   '$ Creatinine 0.44 - 1.00 mg/dL 0.76  0.87  0.78   Sodium 135 - 145 mmol/L 139  140  139   Potassium 3.5 - 5.1 mmol/L 3.7  3.6  3.6   Chloride 98 - 111 mmol/L 105  102  104   CO2 22 - 32 mmol/L '27  31  30   '$ Calcium 8.9 - 10.3 mg/dL 8.4  9.1  8.9   Total Protein 6.5 - 8.1 g/dL 6.5     Total Bilirubin 0.3 - 1.2 mg/dL 0.7     Alkaline Phos 38 - 126 U/L 85     AST 15 - 41 U/L 14     ALT 0 - 44 U/L 6      EKG shows LBBB (chronic).  Assessment and Plan: * COVID-19 virus infection COVID-19, No O2 requirement at this time COVID  pathway Offered paxlovid but husband doesn't want [place] to use that med.  Will offer molnupiravir No O2 requirement so doesn't qualify for remdesivir at this time. Monoclonal ABs no longer authorized by the FDA. Daily labs Check CRP, D.dimer, procalcitonin 1 dose of rocephin + azithro in ED  Parkinson disease [facility]) Worsened parkinson's motor symptoms in setting of COVID-19 to point where she cannot ambulate. Takes sinemet 8 times daily, continue home sinemet.  HTN (hypertension) Cont lisinopril      Advance Care Planning:   Code Status: Full Code  Consults: None  Family Communication: No family in room  Severity of Illness: The appropriate patient status for this patient is OBSERVATION. Observation status is judged to be reasonable and necessary in order to provide the required intensity of service to ensure the patient's safety. The patient's presenting symptoms, physical exam findings, and initial radiographic and laboratory data in the context of their medical condition is felt to place them at decreased risk for further clinical deterioration. Furthermore, it is anticipated that the patient will be medically stable for discharge from the hospital within 2 midnights of admission.   Author: Etta Quill., DO 05/16/2022 10:34 PM  For on call review [email or web address].

## 2022-05-16 NOTE — Assessment & Plan Note (Signed)
Cont lisinopril

## 2022-05-16 NOTE — ED Triage Notes (Signed)
Pt just arrived home yesterday from a cruise and was not acting normal.  Pt husband state she started having cold like symptoms 2 days ago with a cough. Pt normally alert and oriented x4, today pt only oriented to self. Of note, pt was also treated 2 weeks ago for a UTI.

## 2022-05-16 NOTE — ED Provider Notes (Signed)
[facility] DEPT Provider Note   CSN: [id number] Arrival date & time: 05/16/22  1515     History  Chief Complaint  Patient presents with   Altered Mental Status    Morgan [name] is a 71 y.o. female.  71 year old female with prior medical history as detailed below presents for evaluation.  Patient reports that she came home from a cruise yesterday.  Patient reports that she started having cough over the last 2 days.  She is now on day 3 of symptoms.  She also reports subjective fevers and chills.  Today the patient reports that she felt worse and apparently was confused.  She is alert and oriented on arrival.  Patient reports difficulty ambulating secondary to weakness and her prior history of Parkinson's disease  Patient reports increasing shortness of breath with exertion.  She reports cough that is productive of sputum.  2 weeks prior to today's evaluation she received treatment for UTI.    The history is provided by the patient, medical records and the EMS personnel.       Home Medications Prior to Admission medications   Medication Sig Start Date End Date Taking? Authorizing Provider  ALPRAZolam Duanne Moron) 0.25 MG tablet Take 0.25 mg by mouth daily as needed for anxiety.     [provider]  aspirin 325 MG tablet Take 325-650 mg by mouth daily as needed (for headaches/pain.).     [provider]  atorvastatin (LIPITOR) 10 MG tablet Take 10 mg by mouth in the morning.    [provider]  carbidopa-levodopa (SINEMET IR) 25-100 MG tablet TAKE 1 TABLET EVERY 2 HOURSUP TO 8 TABLETS PER DAY 03/28/22   [name], Eustace Quail, DO  Cholecalciferol (VITAMIN D-3) 5000 units TABS Take 5,000 Units by mouth 2 (two) times daily.    [provider]  clonazePAM (KLONOPIN) 0.5 MG tablet Take 0.5 mg by mouth 2 (two) times daily as needed. 01/10/22   [provider]  Coenzyme Q10-Vitamin E (QUNOL ULTRA COQ10 PO) Take 1 capsule  by mouth daily.    [provider]  CYMBALTA 60 MG capsule TAKE 1 CAPSULE DAILY 03/28/22   [name], Eustace Quail, DO  gabapentin (NEURONTIN) 400 MG capsule TAKE 1 CAPSULE 3 TIMES A   DAY Patient taking differently: Take 400 mg by mouth 3 (three) times daily as needed (pain). 11/16/21   [name], Eustace Quail, DO  lisinopril (ZESTRIL) 10 MG tablet TAKE 1 TABLET BY MOUTH EVERY DAY Patient not taking: Reported on 04/07/2022 01/31/22   Loel Dubonnet, NP  lisinopril (ZESTRIL) 2.5 MG tablet Take 2.5 mg by mouth once. 04/05/22   [provider]  lisinopril (ZESTRIL) 5 MG tablet TAKE 1 TAB BY MOUTH AS NEEDED IF SYSTOLIC BP LESS THAN 144, HOLD LISINOPRIL '10MG'$ ,TAKE LISINOPRIL '5MG'$  05/05/22   Croitoru, Mihai, MD  methocarbamol (ROBAXIN) 750 MG tablet Take 750 mg by mouth every 6 (six) hours as needed for muscle spasms. 03/25/20   [provider]  Multiple Vitamins-Minerals (PRESERVISION AREDS 2 PO) Take 1 tablet by mouth 2 (two) times daily.    [provider]  Polyethylene Glycol 400 (BLINK TEARS) 0.25 % SOLN Place 1 drop into both eyes daily as needed (dry eyes).    [provider]  raloxifene (EVISTA) 60 MG tablet Take 60 mg by mouth daily.    [provider]  ramelteon (ROZEREM) 8 MG tablet TAKE 1 TABLET AT BEDTIME 03/28/22   [name], Eustace Quail, DO  Allergies    Other, Phenergan [promethazine hcl], Sporanox [itraconazole], and Tylenol [acetaminophen]    Review of Systems   Review of Systems  All other systems reviewed and are negative.   Physical Exam Updated Vital Signs BP 131/78 (BP Location: Left Arm)   Pulse 98   Temp (!) 100.4 F (38 C) (Oral)   Resp (!) 24   Ht '5\' 5"'$  (1.651 m)   Wt 70.8 kg   SpO2 96%   BMI 25.96 kg/m  Physical Exam Vitals and nursing note reviewed.  Constitutional:      General: She is not in acute distress.    Appearance: Normal appearance. She is well-developed.     Comments: Alert, coughing, febrile  HENT:     Head:  Normocephalic and atraumatic.  Eyes:     Conjunctiva/sclera: Conjunctivae normal.     Pupils: Pupils are equal, round, and reactive to light.  Cardiovascular:     Rate and Rhythm: Regular rhythm. Tachycardia present.     Heart sounds: Normal heart sounds.  Pulmonary:     Effort: Pulmonary effort is normal. No respiratory distress.     Breath sounds: Normal breath sounds.  Abdominal:     General: There is no distension.     Palpations: Abdomen is soft.     Tenderness: There is no abdominal tenderness.  Musculoskeletal:        General: No deformity. Normal range of motion.     Cervical back: Normal range of motion and neck supple.  Skin:    General: Skin is warm and dry.  Neurological:     General: No focal deficit present.     Mental Status: She is alert and oriented to person, place, and time.     ED Results / Procedures / Treatments   Labs (all labs ordered are listed, but only abnormal results are displayed) Labs Reviewed  CBG MONITORING, ED - Abnormal; Notable for the following components:      Result Value   Glucose-Capillary 108 (*)    All other components within normal limits  RESP PANEL BY RT-PCR (FLU A&B, COVID) [id number]  CULTURE, BLOOD (ROUTINE X 2)  CULTURE, BLOOD (ROUTINE X 2)  URINALYSIS, ROUTINE W REFLEX MICROSCOPIC  CBC WITH DIFFERENTIAL/PLATELET  COMPREHENSIVE METABOLIC PANEL  LACTIC ACID, PLASMA  LACTIC ACID, PLASMA  TROPONIN I (HIGH SENSITIVITY)    EKG EKG Interpretation  Date/Time:  Monday May 16 2022 15:33:51 EDT Ventricular Rate:  90 PR Interval:  170 QRS Duration: 137 QT Interval:  431 QTC Calculation: 528 R Axis:   270 Text Interpretation: Sinus rhythm Left bundle branch block Confirmed by Dene Gentry [id number]) on 05/16/2022 3:45:43 PM  Radiology No results found.  Procedures Procedures    Medications Ordered in ED Medications  sodium chloride 0.9 % bolus 500 mL (has no administration in time range)  acetaminophen (TYLENOL)  tablet 650 mg (has no administration in time range)    ED Course/ Medical Decision Making/ A&P                           Medical Decision Making Amount and/or Complexity of Data Reviewed Labs: ordered. Radiology: ordered.  Risk OTC drugs.    Medical Screen Complete  This patient presented to the ED with complaint of fever, cough.  This complaint involves an extensive number of treatment options. The initial differential diagnosis includes, but is not limited to, viral versus bacterial infection, metabolic abnormality, etc.  This  presentation is: Acute, Self-Limited, Previously Undiagnosed, Uncertain Prognosis, Complicated, Systemic Symptoms, and Threat to Life/Bodily Function  Patient is returning home after recent travel.  Patient's spouse had similar URI symptoms 1 week ago.    Patient is now experiencing 3 days of malaise, fatigue, fevers, cough, weakness. Presentation is concerning for possible pneumonia.  Antibiotics administered upon arrival given presentation. Chest x-ray is without acute abnormality.  Patient's COVID test is positive today.    Patient is weak and unable to ambulate in a safe manner.  She would benefit from admission for further work-up and treatment.  Hospitalist service is aware case and will evaluate for same.    Co morbidities that complicated the patient's evaluation  Parkinson's   Additional history obtained:  Additional history obtained from Spouse External records from outside sources obtained and reviewed including prior ED visits and prior Inpatient records.    Lab Tests:  I ordered and personally interpreted labs.  The pertinent results include: CBC, CMP, COVID, flu, UA, troponin, lactic acid   Imaging Studies ordered:  I ordered imaging studies including CT head, chest x-ray I independently visualized and interpreted obtained imaging which showed NAD I agree with the radiologist interpretation.   Cardiac  Monitoring:  The patient was maintained on a cardiac monitor.  I personally viewed and interpreted the cardiac monitor which showed an underlying rhythm of: NSR   Medicines ordered:  I ordered medication including antibiotics, IV fluids for suspected pneumonia Reevaluation of the patient after these medicines showed that the patient: improved   Problem List / ED Course:  Weakness, fatigue, acute COVID infection   Reevaluation:  After the interventions noted above, I reevaluated the patient and found that they have: improved  Disposition:  After consideration of the diagnostic results and the patients response to treatment, I feel that the patent would benefit from admission.          Final Clinical Impression(s) / ED Diagnoses Final diagnoses:  Weakness  COVID    Rx / DC Orders ED Discharge Orders     None         Valarie Merino, MD 05/16/22 2239

## 2022-05-16 NOTE — Assessment & Plan Note (Signed)
Worsened parkinson's motor symptoms in setting of COVID-19 to point where she cannot ambulate. 1. Takes sinemet 8 times daily, continue home sinemet.

## 2022-05-17 ENCOUNTER — Other Ambulatory Visit: Payer: Self-pay

## 2022-05-17 ENCOUNTER — Encounter (HOSPITAL_COMMUNITY): Payer: Self-pay | Admitting: Internal Medicine

## 2022-05-17 DIAGNOSIS — U071 COVID-19: Secondary | ICD-10-CM | POA: Diagnosis not present

## 2022-05-17 LAB — BLOOD CULTURE ID PANEL (REFLEXED) - BCID2

## 2022-05-17 LAB — CBC WITH DIFFERENTIAL/PLATELET
Abs Immature Granulocytes: 0.07 10*3/uL (ref 0.00–0.07)
Basophils Absolute: 0 10*3/uL (ref 0.0–0.1)
Basophils Relative: 0 %
Eosinophils Absolute: 0.1 10*3/uL (ref 0.0–0.5)
Eosinophils Relative: 1 %
HCT: 35.2 % — ABNORMAL LOW (ref 36.0–46.0)
Hemoglobin: 11.5 g/dL — ABNORMAL LOW (ref 12.0–15.0)
Immature Granulocytes: 1 %
Lymphocytes Relative: 9 %
Lymphs Abs: 1.1 10*3/uL (ref 0.7–4.0)
MCH: 30.1 pg (ref 26.0–34.0)
MCHC: 32.7 g/dL (ref 30.0–36.0)
MCV: 92.1 fL (ref 80.0–100.0)
Monocytes Absolute: 0.6 10*3/uL (ref 0.1–1.0)
Monocytes Relative: 5 %
Neutro Abs: 10.1 10*3/uL — ABNORMAL HIGH (ref 1.7–7.7)
Neutrophils Relative %: 84 %
Platelets: 202 10*3/uL (ref 150–400)
RBC: 3.82 MIL/uL — ABNORMAL LOW (ref 3.87–5.11)
RDW: 14.2 % (ref 11.5–15.5)
WBC: 12.1 10*3/uL — ABNORMAL HIGH (ref 4.0–10.5)
nRBC: 0 % (ref 0.0–0.2)

## 2022-05-17 LAB — C-REACTIVE PROTEIN: CRP: 10.7 mg/dL — ABNORMAL HIGH (ref <1.0)

## 2022-05-17 LAB — COMPREHENSIVE METABOLIC PANEL
ALT: 10 U/L (ref 0–44)
AST: 16 U/L (ref 15–41)
Albumin: 3.3 g/dL — ABNORMAL LOW (ref 3.5–5.0)
Alkaline Phosphatase: 85 U/L (ref 38–126)
Anion gap: 10 (ref 5–15)
BUN: 10 mg/dL (ref 8–23)
CO2: 23 mmol/L (ref 22–32)
Calcium: 8.6 mg/dL — ABNORMAL LOW (ref 8.9–10.3)
Chloride: 106 mmol/L (ref 98–111)
Creatinine, Ser: 0.65 mg/dL (ref 0.44–1.00)
GFR, Estimated: >60 mL/min (ref >60)
Glucose, Bld: 113 mg/dL — ABNORMAL HIGH (ref 70–99)
Potassium: 3.6 mmol/L (ref 3.5–5.1)
Sodium: 139 mmol/L (ref 135–145)
Total Bilirubin: 0.6 mg/dL (ref 0.3–1.2)
Total Protein: 6.4 g/dL — ABNORMAL LOW (ref 6.5–8.1)

## 2022-05-17 LAB — PROCALCITONIN: Procalcitonin: <0.1 ng/mL

## 2022-05-17 LAB — D-DIMER, QUANTITATIVE: D-Dimer, Quant: 0.84 ug/mL-FEU — ABNORMAL HIGH (ref 0.00–0.50)

## 2022-05-17 LAB — MAGNESIUM: Magnesium: 2 mg/dL (ref 1.7–2.4)

## 2022-05-17 MED ORDER — IBUPROFEN 200 MG PO TABS
200.0000 mg | ORAL_TABLET | Freq: Four times a day (QID) | ORAL | Status: DC | PRN
  Administered 2022-05-17: 200 mg via ORAL
  Filled 2022-05-17: qty 1

## 2022-05-17 MED ORDER — HYDRALAZINE HCL 20 MG/ML IJ SOLN
10.0000 mg | Freq: Four times a day (QID) | INTRAMUSCULAR | Status: DC | PRN
  Administered 2022-05-17: 10 mg via INTRAVENOUS
  Filled 2022-05-17: qty 1

## 2022-05-17 NOTE — Progress Notes (Signed)
PHARMACY - PHYSICIAN COMMUNICATION CRITICAL VALUE ALERT - BLOOD CULTURE IDENTIFICATION (BCID)  Morgan [name] is an 71 y.o. female who presented to [facility] on 05/16/2022 with a chief complaint of COVID, weakness  Assessment:  1/4 BCx bottles growing MRSE (same bottle previously grew GPRs considered contam; reasonable to conclude this represents same contamination)  Name of physician (or Provider) ContactedLennox Grumbles  Current antibiotics: none  Changes to prescribed antibiotics recommended: Continue to hold off on starting abx; repeating BCx in AM Recommendations accepted by provider  Results for orders placed or performed during the hospital encounter of 05/16/22  Blood Culture ID Panel (Reflexed) (Collected: 05/16/2022  3:35 PM)  Result Value Ref Range   Enterococcus faecalis NOT DETECTED NOT DETECTED   Enterococcus Faecium NOT DETECTED NOT DETECTED   Listeria monocytogenes NOT DETECTED NOT DETECTED   Staphylococcus species DETECTED (A) NOT DETECTED   Staphylococcus aureus (BCID) NOT DETECTED NOT DETECTED   Staphylococcus epidermidis DETECTED (A) NOT DETECTED   Staphylococcus lugdunensis NOT DETECTED NOT DETECTED   Streptococcus species NOT DETECTED NOT DETECTED   Streptococcus agalactiae NOT DETECTED NOT DETECTED   Streptococcus pneumoniae NOT DETECTED NOT DETECTED   Streptococcus pyogenes NOT DETECTED NOT DETECTED   A.calcoaceticus-baumannii NOT DETECTED NOT DETECTED   Bacteroides fragilis NOT DETECTED NOT DETECTED   Enterobacterales NOT DETECTED NOT DETECTED   Enterobacter cloacae complex NOT DETECTED NOT DETECTED   Escherichia coli NOT DETECTED NOT DETECTED   Klebsiella aerogenes NOT DETECTED NOT DETECTED   Klebsiella oxytoca NOT DETECTED NOT DETECTED   Klebsiella pneumoniae NOT DETECTED NOT DETECTED   Proteus species NOT DETECTED NOT DETECTED   Salmonella species NOT DETECTED NOT DETECTED   Serratia marcescens NOT DETECTED NOT DETECTED   Haemophilus influenzae NOT  DETECTED NOT DETECTED   Neisseria meningitidis NOT DETECTED NOT DETECTED   Pseudomonas aeruginosa NOT DETECTED NOT DETECTED   Stenotrophomonas maltophilia NOT DETECTED NOT DETECTED   Candida albicans NOT DETECTED NOT DETECTED   Candida auris NOT DETECTED NOT DETECTED   Candida glabrata NOT DETECTED NOT DETECTED   Candida krusei NOT DETECTED NOT DETECTED   Candida parapsilosis NOT DETECTED NOT DETECTED   Candida tropicalis NOT DETECTED NOT DETECTED   Cryptococcus neoformans/gattii NOT DETECTED NOT DETECTED   Methicillin resistance mecA/C DETECTED (A) NOT DETECTED    Morgan [name] A 05/17/2022  8:33 PM

## 2022-05-17 NOTE — Plan of Care (Signed)
  Problem: Safety: Goal: Ability to remain free from injury will improve Outcome: Progressing   Problem: Coping: Goal: Psychosocial and spiritual needs will be supported Outcome: Progressing

## 2022-05-17 NOTE — Progress Notes (Signed)
PHARMACY - PHYSICIAN COMMUNICATION CRITICAL VALUE ALERT - BLOOD CULTURE IDENTIFICATION (BCID)  Morgan [name] is an 71 y.o. female who presented to [facility] on 05/16/2022 with a chief complaint of AMS.   Assessment:  71 yo F found to have COVID. Now 1/4 bottles from blood cx shows gram positive rods. Probable contaminant, no BCID will be run.   Name of physician (or Provider) Contacted: Costin Gherghe  Current antibiotics: none  Changes to prescribed antibiotics recommended:  No antibiotics needed at this time Recommendations accepted by provider  Morgan [name] 05/17/2022  12:54 PM

## 2022-05-17 NOTE — Evaluation (Addendum)
Physical Therapy Evaluation Patient Details Name: Morgan [name] MRN: [id number] DOB: 15-May-1951 Today's Date: 05/17/2022  History of Present Illness  71 year old female with history of Parkinson's disease, HTN, HLD, came home 2 days prior to hospitalization from a cruise, has been having intermittent confusion for the last 3 days, was coughing, had fever and chills and was extremely weak.  She was brought to the hospital and diagnosed with COVID-19.   Clinical Impression  Morgan [name] is 71 y.o. female admitted with above HPI and diagnosis. Patient is currently limited by functional impairments below (see PT problem list). Patient lives with her husband and is modified independent with occasional use of RW/SPC in home and scooter for long distances at baseline. Overall pt noted to have tremors during all mobility, balance improved with 1HHA for transfers and gait compared to use of RW. She is mobilizing at safe level to return home with assist from spouse. Patient will benefit from continued skilled PT interventions to address impairments and progress independence with mobility, recommending HHPT. Acute PT will follow and progress as able.        Recommendations for follow up therapy are one component of a multi-disciplinary discharge planning process, led by the attending physician.  Recommendations may be updated based on patient status, additional functional criteria and insurance authorization.  Follow Up Recommendations Home health PT      Assistance Recommended at Discharge Intermittent Supervision/Assistance  Patient can return home with the following  A little help with walking and/or transfers;A little help with bathing/dressing/bathroom;Assistance with cooking/housework;Assist for transportation;Help with stairs or ramp for entrance    Equipment Recommendations None recommended by PT  Recommendations for Other Services       Functional Status Assessment Patient has had a  recent decline in their functional status and demonstrates the ability to make significant improvements in function in a reasonable and predictable amount of time.     Precautions / Restrictions Precautions Precautions: Fall Restrictions Weight Bearing Restrictions: No      Mobility  Bed Mobility Overal bed mobility: Needs Assistance Bed Mobility: Supine to Sit, Sit to Supine     Supine to sit: Supervision, HOB elevated Sit to supine: Supervision   General bed mobility comments: pt able to move to EOB with use of bed rail and extra time. slight posterior lean sitting EOB but no LOB. pt able to raise bil LE's back onto bed.    Transfers Overall transfer level: Needs assistance Equipment used: Rolling walker (2 wheels), None Transfers: Sit to/from Stand Sit to Stand: Min guard           General transfer comment: guarding for safety with power up from EOB and toilet. 1x min assist when pt standing from lower and angled position at EOB. Cues for safety to sit only when backed up to surface fully; pt reached back without cues.    Ambulation/Gait Ambulation/Gait assistance: Min assist, Min guard Gait Distance (Feet): 350 Feet Assistive device: Rolling walker (2 wheels), 1 person hand held assist, None Gait Pattern/deviations: Step-through pattern, Decreased step length - right, Decreased step length - left, Decreased stride length, Shuffle, Narrow base of support, Staggering right, Drifts right/left Gait velocity: decr/fair     General Gait Details: cues for proximity to RW at start and safe pace. Assist needed to guide walker as pt tends to drift Lt>Rt with RW. pt steadier with 1HHA for ambulation and was able to walk short bout in room with no UE support and guarding  for safety.  Stairs            Wheelchair Mobility    Modified Rankin (Stroke Patients Only)       Balance Overall balance assessment: Needs assistance Sitting-balance support: Feet  supported Sitting balance-Leahy Scale: Good     Standing balance support: Bilateral upper extremity supported, Single extremity supported, During functional activity Standing balance-Leahy Scale: Fair                               Pertinent Vitals/Pain Pain Assessment Pain Assessment: No/denies pain    Home Living Family/patient expects to be discharged to:: Private residence Living Arrangements: Spouse/significant other Available Help at Discharge: Family;Available 24 hours/day Type of Home: House Home Access: Stairs to enter Entrance Stairs-Rails: Psychiatric nurse of Steps: 3   Home Layout: Two level;Able to live on main level with bedroom/bathroom Home Equipment: Kasandra Knudsen - single point;Transport Oncologist (2 wheels) Additional Comments: teak bench in shower    Prior Function Prior Level of Function : Independent/Modified Independent             Mobility Comments: Uses scooter for long distances; no device or sometimes RW in house ADLs Comments: Independent with ADLs; spouse assist with IADLs     Hand Dominance   Dominant Hand: Right    Extremity/Trunk Assessment   Upper Extremity Assessment Upper Extremity Assessment: Overall WFL for tasks assessed    Lower Extremity Assessment Lower Extremity Assessment: Generalized weakness    Cervical / Trunk Assessment Cervical / Trunk Assessment: Normal  Communication   Communication: No difficulties  Cognition Arousal/Alertness: Awake/alert Behavior During Therapy: WFL for tasks assessed/performed Overall Cognitive Status: Within Functional Limits for tasks assessed                                          General Comments      Exercises     Assessment/Plan    PT Assessment Patient needs continued PT services  PT Problem List Decreased strength;Decreased activity tolerance;Decreased balance;Decreased mobility;Decreased coordination;Decreased knowledge  of use of DME;Decreased safety awareness;Decreased knowledge of precautions       PT Treatment Interventions DME instruction;Gait training;Stair training;Functional mobility training;Therapeutic activities;Therapeutic exercise;Balance training;Neuromuscular re-education;Patient/family education    PT Goals (Current goals can be found in the Care Plan section)  Acute Rehab PT Goals Patient Stated Goal: get home PT Goal Formulation: With patient Time For Goal Achievement: 05/31/22 Potential to Achieve Goals: Good    Frequency Min 3X/week     Co-evaluation               AM-PAC PT "6 Clicks" Mobility  Outcome Measure Help needed turning from your back to your side while in a flat bed without using bedrails?: None Help needed moving from lying on your back to sitting on the side of a flat bed without using bedrails?: None Help needed moving to and from a bed to a chair (including a wheelchair)?: A Little Help needed standing up from a chair using your arms (e.g., wheelchair or bedside chair)?: A Little Help needed to walk in hospital room?: A Little Help needed climbing 3-5 steps with a railing? : A Little 6 Click Score: 20    End of Session Equipment Utilized During Treatment: Gait belt Activity Tolerance: Patient tolerated treatment well Patient left: with call bell/phone within  reach Nurse Communication: Mobility status PT Visit Diagnosis: Unsteadiness on feet (R26.81);Muscle weakness (generalized) (M62.81);Difficulty in walking, not elsewhere classified (R26.2);Other symptoms and signs involving the nervous system (R29.898)    Time: 1030-1314 PT Time Calculation (min) (ACUTE ONLY): 34 min   Charges:   PT Evaluation $PT Eval Low Complexity: 1 Low PT Treatments $Gait Training: 8-22 mins        Verner Mould, DPT Acute Rehabilitation Services Office [phone] Pager [phone]  05/17/22 3:40 PM

## 2022-05-17 NOTE — Evaluation (Signed)
Occupational Therapy Evaluation Patient Details Name: Morgan [name] MRN: [id number] DOB: Nov 08, 1950 Today's Date: 05/17/2022   History of Present Illness 71 year old female with history of Parkinson's disease, HTN, HLD, came home 2 days prior to hospitalization from a cruise, has been having intermittent confusion for the last 3 days, was coughing, had fever and chills and was extremely weak.  She was brought to the hospital and diagnosed with COVID-19.   Clinical Impression   Pt currently requires min guard assist to min assist with most assessed functional tasks, including supine to sit, sit to stand, toileting at bathroom level, and hand washing standing at the sink. Pt reported having 6/10 chronic B LE pain, and she was further noted to be with slight deconditioning, mild generalized strength deficits, unsteadiness in dynamic standing, and occasional cognitive fog. She will benefit from further OT services to maximize her safety and independence with ADLs, and to facilitate her safe return home.       Recommendations for follow up therapy are one component of a multi-disciplinary discharge planning process, led by the attending physician.  Recommendations may be updated based on patient status, additional functional criteria and insurance authorization.   Follow Up Recommendations  Home health OT    Assistance Recommended at Discharge Intermittent Supervision/Assistance  Patient can return home with the following A little help with walking and/or transfers;A little help with bathing/dressing/bathroom;Assistance with cooking/housework    Functional Status Assessment  Patient has had a recent decline in their functional status and demonstrates the ability to make significant improvements in function in a reasonable and predictable amount of time.  Equipment Recommendations  None recommended by OT       Precautions / Restrictions Precautions Precautions: Fall Restrictions Weight  Bearing Restrictions: No      Mobility Bed Mobility Overal bed mobility: Needs Assistance Bed Mobility: Supine to Sit, Sit to Supine     Supine to sit: Min assist Sit to supine: Supervision        Transfers Overall transfer level: Needs assistance   Transfers: Sit to/from Stand Sit to Stand: Min guard                  Balance     Sitting balance-Leahy Scale: Good       Standing balance-Leahy Scale: Fair               ADL either performed or assessed with clinical judgement   ADL   Eating/Feeding: Independent Eating/Feeding Details (indicate cue type and reason): based on clinical judgement Grooming: Min guard Grooming Details (indicate cue type and reason): She performed hand washing in standing at the sink.         Upper Body Dressing : Set up Upper Body Dressing Details (indicate cue type and reason): simulated in sitting Lower Body Dressing: Minimal assistance   Toilet Transfer: Nature conservation officer;Ambulation   Toileting- Clothing Manipulation and Hygiene: Min guard;Sit to/from stand               Vision  She wears corrective lenses.               Pertinent Vitals/Pain Pain Assessment Pain Assessment: 0-10 Pain Score: 6  Pain Location: B LE Pain Descriptors / Indicators:  (Chronic) Pain Intervention(s): Repositioned     Hand Dominance Right   Extremity/Trunk Assessment Upper Extremity Assessment Upper Extremity Assessment: Overall WFL for tasks assessed   Lower Extremity Assessment Lower Extremity Assessment: Overall WFL for tasks assessed   Cervical /  Trunk Assessment Cervical / Trunk Assessment: Normal   Communication Communication Communication: No difficulties   Cognition Arousal/Alertness: Awake/alert Behavior During Therapy: WFL for tasks assessed/performed Overall Cognitive Status: Within Functional Limits for tasks assessed           General Comments: Oriented to person, place, and situation;  disoriented to time. Able to follow 1-2 step commands without difficulty                Home Living Family/patient expects to be discharged to:: Private residence Living Arrangements: Spouse/significant other Available Help at Discharge: Family Type of Home: House Home Access: Stairs to enter  Home Layout: Two level;Able to live on main level with bedroom/bathroom     Bathroom Shower/Tub: Walk-in shower       Home Equipment: Kasandra Knudsen - single point;Electric scooter;Shower seat - built in         Prior Functioning/Environment Prior Level of Function : Independent/Modified Independent             Mobility Comments: no AD used inside home; scooter used for community mobility ADLs Comments: She was modified independent to independent with ADLs, however occasional light assist was required for bathing. She does not drive and her spouse managed household chores.        OT Problem List: Decreased strength;Decreased activity tolerance;Impaired balance (sitting and/or standing);Decreased coordination;Decreased knowledge of use of DME or AE;Pain      OT Treatment/Interventions: Self-care/ADL training;Therapeutic exercise;Neuromuscular education;Energy conservation;DME and/or AE instruction;Therapeutic activities;Cognitive remediation/compensation;Patient/family education;Balance training    OT Goals(Current goals can be found in the care plan section) Acute Rehab OT Goals Patient Stated Goal: To be able to get out of the bed during her hospital stay Time For Goal Achievement: 05/30/22 Potential to Achieve Goals: Good ADL Goals Pt Will Perform Grooming: with supervision Pt Will Perform Upper Body Dressing: with set-up Pt Will Perform Lower Body Dressing: with supervision;sit to/from stand Pt Will Transfer to Toilet: with supervision;ambulating Pt Will Perform Toileting - Clothing Manipulation and hygiene: with supervision;sit to/from stand  OT Frequency: Min 2X/week        AM-PAC OT "6 Clicks" Daily Activity     Outcome Measure Help from another person eating meals?: None Help from another person taking care of personal grooming?: A Little Help from another person toileting, which includes using toliet, bedpan, or urinal?: A Little Help from another person bathing (including washing, rinsing, drying)?: A Little Help from another person to put on and taking off regular upper body clothing?: None Help from another person to put on and taking off regular lower body clothing?: A Little 6 Click Score: 20   End of Session    Activity Tolerance: Patient tolerated treatment well Patient left: in bed;with call bell/phone within reach;with bed alarm set  OT Visit Diagnosis: Unsteadiness on feet (R26.81);Muscle weakness (generalized) (M62.81)                Time: 0981-1914 OT Time Calculation (min): 25 min Charges:  OT General Charges $OT Visit: 1 Visit OT Evaluation $OT Eval Moderate Complexity: 1 Mod OT Treatments $Self Care/Home Management : 8-22 mins    Leota Sauers, OTR/L 05/17/2022, 4:48 PM

## 2022-05-17 NOTE — Progress Notes (Signed)
PROGRESS NOTE  SAMIKSHA PELLICANO ZOX:[id number] DOB: [birth year] DOA: 05/16/2022 PCP: [name], MD   LOS: 0 days   Brief Narrative / Interim history: 71 year old female with history of Parkinson's disease, HTN, HLD, came home 2 days prior to hospitalization from a cruise, has been having intermittent confusion for the last 3 days, was coughing, had fever and chills and was extremely weak.  She was brought to the hospital and diagnosed with COVID-19.  Significant events: 9/18-admit  Significant imaging / results / micro data: Chest x-ray 9/18 -no active disease CT head 9/18-no acute intracranial abnormalities  Subjective / 24h Interval events: She denies any shortness of breath, denies any fever or chills overnight.  Continues with a persistent cough.  Remains profoundly weak, per bedside RN required 2+ person assist just to get her to the chair  Assesement and Plan: Principal Problem:   COVID-19 virus infection Active Problems:   Parkinson disease ([place])   HTN (hypertension)   Principal problem COVID-19 viral illness-continue molnupiravir, continue supportive care.  She is not hypoxic and does not require supplemental oxygen, no need for steroids.  COVID-19 Labs  Recent Labs    05/17/22 0525  DDIMER 0.84*  CRP 10.7*    Lab Results  Component Value Date   SARSCOV2NAA POSITIVE (A) 05/16/2022   [place] NEGATIVE 10/17/2021    Active problems Weakness in the setting of underlying Parkinson's disease-worsened symptoms in the setting of COVID-19.  Worried about safety at home given 2+ person assist reported by bedside RN.  PT/OT evaluation pending  Essential hypertension-continue lisinopril  Hyperlipidemia-continue statin   Scheduled Meds:  aspirin EC  81 mg Oral Daily   atorvastatin  10 mg Oral Daily   carbidopa-levodopa  1 tablet Oral 7 times per day   DULoxetine  60 mg Oral Daily   enoxaparin (LOVENOX) injection  40 mg Subcutaneous Q24H   lisinopril  10  mg Oral Daily   molnupiravir EUA  4 capsule Oral BID   raloxifene  60 mg Oral Daily   Continuous Infusions: PRN Meds:.carbidopa-levodopa, gabapentin, ondansetron **OR** ondansetron (ZOFRAN) IV, polyvinyl alcohol, ramelteon  Current Outpatient Medications  Medication Instructions   ALPRAZolam (XANAX) 0.25 mg, Oral, Daily PRN   aspirin EC 81 mg, Oral, Daily, Swallow whole.   aspirin 325-650 mg, Oral, Daily PRN   atorvastatin (LIPITOR) 10 mg, Oral, Every morning   BLINK TEARS 0.25 % SOLN 1 drop, Both Eyes, 3 times daily PRN   carbidopa-levodopa (SINEMET IR) 25-100 MG tablet TAKE 1 TABLET EVERY 2 HOURSUP TO 8 TABLETS PER DAY   CINNAMON PO 1 capsule, Oral, Every morning   clonazePAM (KLONOPIN) 0.5 mg, Oral, 2 times daily PRN   Coenzyme Q10-Vitamin E (QUNOL ULTRA COQ10 PO) 1 capsule, Oral, Daily   CYMBALTA 60 MG capsule TAKE 1 CAPSULE DAILY   gabapentin (NEURONTIN) 400 MG capsule TAKE 1 CAPSULE 3 TIMES A   DAY   Ibuprofen 400 mg, Oral, Every 8 hours PRN   lisinopril (ZESTRIL) 10 MG tablet TAKE 1 TABLET BY MOUTH EVERY DAY   methocarbamol (ROBAXIN) 750 mg, Oral, Every 6 hours PRN   Multiple Vitamins-Minerals (PRESERVISION AREDS 2 PO) 1 capsule, Oral, 2 times daily   raloxifene (EVISTA) 60 mg, Oral, Every morning   ramelteon (ROZEREM) 8 MG tablet TAKE 1 TABLET AT BEDTIME   Vitamin D3 2,500 Units, Oral, Daily    Diet Orders (From admission, onward)     Start     Ordered   05/16/22 2228  Diet  Heart Room service appropriate? Yes; Fluid consistency: Thin  Diet effective now       Question Answer Comment  Room service appropriate? Yes   Fluid consistency: Thin      05/16/22 2229            DVT prophylaxis: enoxaparin (LOVENOX) injection 40 mg Start: 05/17/22 1000   Lab Results  Component Value Date   PLT 202 05/17/2022      Code Status: Full Code  Family Communication: Discussed with husband at bedside  Status is: Observation The patient will require care spanning > 2  midnights and should be moved to inpatient because: Persistent weakness   Level of care: Med-Surg  Consultants:  None  Objective: Vitals:   05/16/22 2321 05/17/22 0018 05/17/22 0348 05/17/22 0834  BP:  (!) 156/84 (!) 159/85 (!) 149/88  Pulse:  (!) 108 (!) 102 96  Resp:  '20 19 16  '$ Temp: 98.5 F (36.9 C) 98.4 F (36.9 C) 98.1 F (36.7 C) 98.9 F (37.2 C)  TempSrc:  Oral Oral Oral  SpO2:  94% 95% 92%  Weight:  72.6 kg    Height:  '5\' 5"'$  (1.651 m)      Intake/Output Summary (Last 24 hours) at 05/17/2022 1208 Last data filed at 05/17/2022 0600 Gross per 24 hour  Intake 1330 ml  Output 450 ml  Net 880 ml   Wt Readings from Last 3 Encounters:  05/17/22 72.6 kg  04/29/22 70.8 kg  04/07/22 71 kg    Examination: Constitutional: NAD Eyes: no scleral icterus ENMT: Mucous membranes are moist.  Neck: normal, supple Respiratory: clear to auscultation bilaterally, no wheezing, no crackles. Normal respiratory effort.  Cardiovascular: Regular rate and rhythm, no murmurs / rubs / gallops.  Abdomen: non distended, no tenderness. Bowel sounds positive.  Musculoskeletal: no clubbing / cyanosis.  Skin: no rashes Neurologic: Significant resting tremor present, no focal deficits otherwise  Data Reviewed: I have independently reviewed following labs and imaging studies   CBC Recent Labs  Lab 05/16/22 1545 05/17/22 0525  WBC 12.8* 12.1*  HGB 11.4* 11.5*  HCT 35.9* 35.2*  PLT 200 202  MCV 91.8 92.1  MCH 29.2 30.1  MCHC 31.8 32.7  RDW 14.4 14.2  LYMPHSABS 1.4 1.1  MONOABS 0.6 0.6  EOSABS 0.2 0.1  BASOSABS 0.0 0.0    Recent Labs  Lab 05/16/22 1545 05/17/22 0525  NA 139 139  K 3.7 3.6  CL 105 106  CO2 27 23  GLUCOSE 102* 113*  BUN 16 10  CREATININE 0.76 0.65  CALCIUM 8.4* 8.6*  AST 14* 16  ALT 6 10  ALKPHOS 85 85  BILITOT 0.7 0.6  ALBUMIN 3.4* 3.3*  MG  --  2.0  CRP  --  10.7*  DDIMER  --  0.84*  PROCALCITON  --  <0.10  LATICACIDVEN 0.7  --      ------------------------------------------------------------------------------------------------------------------ No results for input(s): "CHOL", "HDL", "LDLCALC", "TRIG", "CHOLHDL", "LDLDIRECT" in the last 72 hours.  No results found for: "HGBA1C" ------------------------------------------------------------------------------------------------------------------ No results for input(s): "TSH", "T4TOTAL", "T3FREE", "THYROIDAB" in the last 72 hours.  Invalid input(s): "FREET3"  Cardiac Enzymes No results for input(s): "CKMB", "TROPONINI", "MYOGLOBIN" in the last 168 hours.  Invalid input(s): "CK" ------------------------------------------------------------------------------------------------------------------ No results found for: "BNP"  CBG: Recent Labs  Lab 05/16/22 1532  GLUCAP 108*    Recent Results (from the past 240 hour(s))  Culture, blood (routine x 2)     Status: None (Preliminary result)   Collection Time:  05/16/22  3:35 PM   Specimen: BLOOD  Result Value Ref Range Status   Specimen Description   Final    BLOOD BLOOD LEFT HAND Performed at [facility] [address] [place], [place] [address]    Special Requests   Final    BOTTLES DRAWN AEROBIC AND ANAEROBIC Blood Culture adequate volume Performed at [facility] [address], [place], [place] [address]    Culture   Final    NO GROWTH < 12 HOURS Performed at [facility] [address] [place], [place] [address]    Report Status PENDING  Incomplete  Resp Panel by RT-PCR (Flu A&B, Covid) Anterior Nasal Swab     Status: Abnormal   Collection Time: 05/16/22  3:45 PM   Specimen: Anterior Nasal Swab  Result Value Ref Range Status   SARS Coronavirus 2 by RT PCR POSITIVE (A) NEGATIVE Final    Comment: (NOTE) SARS-CoV-2 target nucleic acids are DETECTED.  The SARS-CoV-2 RNA is generally detectable in upper respiratory specimens during the acute phase of  infection. Positive results are indicative of the presence of the identified virus, but do not rule out bacterial infection or co-infection with other pathogens not detected by the test. Clinical correlation with patient history and other diagnostic information is necessary to determine patient infection status. The expected result is Negative.  Fact Sheet for Patients: EntrepreneurPulse.com.au  Fact Sheet for Healthcare Providers: IncredibleEmployment.be  This test is not yet approved or cleared by the [place] FDA and  has been authorized for detection and/or diagnosis of SARS-CoV-2 by FDA under an Emergency Use Authorization (EUA).  This EUA will remain in effect (meaning this test can be used) for the duration of  the COVID-19 declaration under Section 564(b)(1) of the A ct, 21 U.S.C. section 360bbb-3(b)(1), unless the authorization is terminated or revoked sooner.     Influenza A by PCR NEGATIVE NEGATIVE Final   Influenza B by PCR NEGATIVE NEGATIVE Final    Comment: (NOTE) The Xpert Xpress SARS-CoV-2/FLU/RSV plus assay is intended as an aid in the diagnosis of influenza from Nasopharyngeal swab specimens and should not be used as a sole basis for treatment. Nasal washings and aspirates are unacceptable for Xpert Xpress SARS-CoV-2/FLU/RSV testing.  Fact Sheet for Patients: EntrepreneurPulse.com.au  Fact Sheet for Healthcare Providers: IncredibleEmployment.be  This test is not yet approved or cleared by the [place] FDA and has been authorized for detection and/or diagnosis of SARS-CoV-2 by FDA under an Emergency Use Authorization (EUA). This EUA will remain in effect (meaning this test can be used) for the duration of the COVID-19 declaration under Section 564(b)(1) of the Act, 21 U.S.C. section 360bbb-3(b)(1), unless the authorization is terminated or revoked.  Performed at [facility], [place] [address] [place], [place] [address]   Culture, blood (routine x 2)     Status: None (Preliminary result)   Collection Time: 05/16/22  3:45 PM   Specimen: BLOOD  Result Value Ref Range Status   Specimen Description   Final    BLOOD RIGHT ANTECUBITAL Performed at [facility] [address], [place], [place] [address]    Special Requests   Final    BOTTLES DRAWN AEROBIC AND ANAEROBIC Blood Culture adequate volume Performed at [facility] [address], [place],  [address]    Culture   Final    NO GROWTH < 12 HOURS Performed at [facility], [address] [place], [place] [address]    Report Status PENDING  Incomplete     Radiology Studies: CT Head Wo Contrast  Result Date: 05/16/2022 CLINICAL DATA:  Mental status change, unknown cause EXAM: CT HEAD WITHOUT CONTRAST TECHNIQUE: Contiguous axial images were obtained from the base of the skull through the vertex without intravenous contrast. RADIATION DOSE REDUCTION: This exam was performed according to the departmental dose-optimization program which includes automated exposure control, adjustment of the mA and/or kV according to patient size and/or use of iterative reconstruction technique. COMPARISON:  06/09/2009 FINDINGS: Brain: No evidence of acute infarction, hemorrhage, hydrocephalus, extra-axial collection or mass lesion/mass effect. Scattered low-density changes within the periventricular and subcortical white matter compatible with chronic microvascular ischemic change. Mild diffuse cerebral volume loss. Vascular: Atherosclerotic calcifications involving the large vessels of the skull base. No unexpected hyperdense vessel. Skull: Normal. Negative for fracture or focal lesion. Sinuses/Orbits: No acute finding. Other: None. IMPRESSION: 1. No acute intracranial abnormality. 2. Mild chronic microvascular ischemic change and cerebral volume loss.  Electronically Signed   By: Davina Poke D.O.   On: 05/16/2022 16:19   DG Chest Port 1 View  Result Date: 05/16/2022 CLINICAL DATA:  Cough. EXAM: PORTABLE CHEST 1 VIEW COMPARISON:  October 17, 2021. FINDINGS: The heart size and mediastinal contours are within normal limits. Both lungs are clear. The visualized skeletal structures are unremarkable. IMPRESSION: No active disease. Electronically Signed   By: Marijo Conception M.D.   On: 05/16/2022 16:17     Marzetta Board, MD, PhD [facility]  Between 7 am - 7 pm I am available, please contact me via Amion (for emergencies) or Securechat (non urgent messages)  Between 7 pm - 7 am I am not available, please contact night coverage MD/APP via Amion

## 2022-05-18 DIAGNOSIS — U071 COVID-19: Secondary | ICD-10-CM | POA: Diagnosis not present

## 2022-05-18 DIAGNOSIS — R531 Weakness: Secondary | ICD-10-CM | POA: Diagnosis not present

## 2022-05-18 DIAGNOSIS — G20 Parkinson's disease: Secondary | ICD-10-CM | POA: Diagnosis not present

## 2022-05-18 DIAGNOSIS — I10 Essential (primary) hypertension: Secondary | ICD-10-CM | POA: Diagnosis not present

## 2022-05-18 LAB — CBC WITH DIFFERENTIAL/PLATELET
Abs Immature Granulocytes: 0.02 10*3/uL (ref 0.00–0.07)
Basophils Absolute: 0 10*3/uL (ref 0.0–0.1)
Basophils Relative: 0 %
Eosinophils Absolute: 0.2 10*3/uL (ref 0.0–0.5)
Eosinophils Relative: 3 %
HCT: 36.5 % (ref 36.0–46.0)
Hemoglobin: 11.5 g/dL — ABNORMAL LOW (ref 12.0–15.0)
Immature Granulocytes: 0 %
Lymphocytes Relative: 25 %
Lymphs Abs: 1.7 10*3/uL (ref 0.7–4.0)
MCH: 29 pg (ref 26.0–34.0)
MCHC: 31.5 g/dL (ref 30.0–36.0)
MCV: 92.2 fL (ref 80.0–100.0)
Monocytes Absolute: 0.5 10*3/uL (ref 0.1–1.0)
Monocytes Relative: 7 %
Neutro Abs: 4.4 10*3/uL (ref 1.7–7.7)
Neutrophils Relative %: 65 %
Platelets: 197 10*3/uL (ref 150–400)
RBC: 3.96 MIL/uL (ref 3.87–5.11)
RDW: 14 % (ref 11.5–15.5)
WBC: 6.9 10*3/uL (ref 4.0–10.5)
nRBC: 0 % (ref 0.0–0.2)

## 2022-05-18 LAB — COMPREHENSIVE METABOLIC PANEL
ALT: 10 U/L (ref 0–44)
AST: 13 U/L — ABNORMAL LOW (ref 15–41)
Albumin: 3 g/dL — ABNORMAL LOW (ref 3.5–5.0)
Alkaline Phosphatase: 74 U/L (ref 38–126)
Anion gap: 8 (ref 5–15)
BUN: 11 mg/dL (ref 8–23)
CO2: 27 mmol/L (ref 22–32)
Calcium: 8.8 mg/dL — ABNORMAL LOW (ref 8.9–10.3)
Chloride: 106 mmol/L (ref 98–111)
Creatinine, Ser: 0.64 mg/dL (ref 0.44–1.00)
GFR, Estimated: >60 mL/min (ref >60)
Glucose, Bld: 99 mg/dL (ref 70–99)
Potassium: 3.4 mmol/L — ABNORMAL LOW (ref 3.5–5.1)
Sodium: 141 mmol/L (ref 135–145)
Total Bilirubin: 0.6 mg/dL (ref 0.3–1.2)
Total Protein: 6.3 g/dL — ABNORMAL LOW (ref 6.5–8.1)

## 2022-05-18 LAB — D-DIMER, QUANTITATIVE: D-Dimer, Quant: 0.54 ug/mL-FEU — ABNORMAL HIGH (ref 0.00–0.50)

## 2022-05-18 LAB — C-REACTIVE PROTEIN: CRP: 9.8 mg/dL — ABNORMAL HIGH (ref <1.0)

## 2022-05-18 MED ORDER — BENZONATATE 100 MG PO CAPS: 100.0000 mg | ORAL_CAPSULE | Freq: Three times a day (TID) | ORAL | 0 refills | Status: AC

## 2022-05-18 MED ORDER — GUAIFENESIN-DM 100-10 MG/5ML PO SYRP: 10.0000 mL | ORAL_SOLUTION | ORAL | 0 refills | Status: DC | PRN

## 2022-05-18 MED ORDER — RAMELTEON 8 MG PO TABS: 8.0000 mg | ORAL_TABLET | Freq: Every evening | ORAL | Status: DC | PRN

## 2022-05-18 MED ORDER — GABAPENTIN 400 MG PO CAPS: 400.0000 mg | ORAL_CAPSULE | Freq: Three times a day (TID) | ORAL | Status: DC | PRN

## 2022-05-18 MED ORDER — POTASSIUM CHLORIDE CRYS ER 20 MEQ PO TBCR
40.0000 meq | EXTENDED_RELEASE_TABLET | Freq: Once | ORAL | Status: AC
  Administered 2022-05-18: 40 meq via ORAL
  Filled 2022-05-18: qty 2

## 2022-05-18 MED ORDER — MOLNUPIRAVIR EUA 200MG CAPSULE: 4.0000 | ORAL_CAPSULE | Freq: Two times a day (BID) | ORAL | 0 refills | Status: AC

## 2022-05-18 MED ORDER — IBUPROFEN 200 MG PO CAPS: 400.0000 mg | ORAL_CAPSULE | Freq: Three times a day (TID) | ORAL | Status: AC | PRN

## 2022-05-18 MED ORDER — CARBIDOPA-LEVODOPA 25-100 MG PO TABS: 1.0000 | ORAL_TABLET | ORAL | Status: DC

## 2022-05-18 NOTE — Discharge Summary (Signed)
Physician Discharge Summary  Morgan [name] HQP:[id number] DOB: Nov 25, 1950  PCP: [name], MD  Admitted from: Home Discharged to: Home  Admit date: 05/16/2022 Discharge date: 05/18/2022  Recommendations for Outpatient Follow-up:    Follow-up Information     [name], MD. Schedule an appointment as soon as possible for a visit in 1 week(s).   Specialty: Family Medicine Why: To be seen with repeat labs (CBC, CMP, magnesium). Contact information: [place], [place] [address] [phone]         Sanda Klein, MD .   Specialty: Cardiology Contact information: [address] [place] [address] [place] Orders (From admission, onward)     Start     Ordered   05/18/22 [place]  At discharge       Question Answer Comment  To provide the following care/treatments PT   To provide the following care/treatments OT      05/18/22 1358             Equipment/Devices: None    Discharge Condition: Improved and stable.   Code Status: Full Code Diet recommendation:  Discharge Diet Orders (From admission, onward)     Start     Ordered   05/18/22 0000  Diet - low sodium heart healthy        05/18/22 1358             Discharge Diagnoses:  Principal Problem:   COVID-19 virus infection Active Problems:   Parkinson disease ([place])   HTN (hypertension)   Brief Summary: 71 year old female with history of Parkinson's disease, HTN, HLD, came home 2 days prior to hospitalization from a cruise, has been having intermittent confusion for the last 3 days, was coughing, had fever and chills and was extremely weak.  She was brought to the hospital and diagnosed with COVID-19.  Assesement and Plan:  Principal problem COVID-19 viral illness-it appears that patient contracted the infection prior to patient.  Has completed 2 days of molnupiravir thus far,  complete total 5 days at discharge.  She was not hypoxic and thereby was not initiated on steroids.  Her main complaint at this time appears to be intermittent hacking cough at which time she has some dyspnea but no dyspnea at other times when she is not coughing.  Added scheduled Tessalon and as needed Robitussin-DM at discharge and advised patient and spouse at bedside that sometimes it may take the cough several days or even a couple of weeks to gradually improved.  They verbalized understanding.   At bedtime troponins x2: Negative.  CRP 10.7 > 9.8.  Venous lactate negative.  Procalcitonin <0.10.  Quantitative D-dimer, 0.84 > 0.54.   Active problems Weakness in the setting of underlying Parkinson's disease-worsened symptoms in the setting of COVID-19.  Worried about safety at home given 2+ person assist reported by bedside RN.  Clinically patient has improved.  She has good assistance from her spouse at home.  As per therapies, arranged for home health PT and OT.  Although there was some concern that her tremors had were sent related to the COVID-19 infection, as per patient and spouse, this has now improved back to her baseline.   Essential hypertension-continue lisinopril.  Mildly uncontrolled at times.  Close outpatient follow-up.  As per spouse, her blood pressures do fluctuate even as outpatient.  Hyperlipidemia-continue statin  1/4 blood culture 9/18 positive for MRSE: The other blood culture from 9/18, negative to date and repeat blood cultures x2 from 9/20 were also negative to date.  Likely a contaminant.  Normocytic anemia: Stable.  Hypokalemia: Replaced prior to discharge.  Follow-up BMP as outpatient.   Consultations: None  Procedures: None   Discharge Instructions  Discharge Instructions     Call MD for:  difficulty breathing, headache or visual disturbances   Complete by: As directed    Call MD for:  extreme fatigue   Complete by: As directed    Call MD for:   persistant dizziness or light-headedness   Complete by: As directed    Call MD for:  persistant nausea and vomiting   Complete by: As directed    Call MD for:  severe uncontrolled pain   Complete by: As directed    Call MD for:  temperature >100.4   Complete by: As directed    Diet - low sodium heart healthy   Complete by: As directed    Increase activity slowly   Complete by: As directed         Medication List     STOP taking these medications    clonazePAM 0.5 MG tablet Commonly known as: KLONOPIN       TAKE these medications    ALPRAZolam 0.25 MG tablet Commonly known as: XANAX Take 0.25 mg by mouth daily as needed for anxiety.   aspirin EC 81 MG tablet Take 81 mg by mouth daily. Swallow whole. What changed: Another medication with the same name was removed. Continue taking this medication, and follow the directions you see here.   atorvastatin 10 MG tablet Commonly known as: LIPITOR Take 10 mg by mouth in the morning.   benzonatate 100 MG capsule Commonly known as: Tessalon Perles Take 1 capsule (100 mg total) by mouth 3 (three) times daily for 10 days.   Blink Tears 0.25 % Soln Generic drug: Polyethylene Glycol 400 Place 1 drop into both eyes 3 (three) times daily as needed (for dryness).   carbidopa-levodopa 25-100 MG tablet Commonly known as: SINEMET IR Take 1 tablet by mouth See admin instructions. Take 1 tablet by mouth at 6 AM, 8 AM, 10 AM, 12 NOON, 2 PM, 4 PM, 6 PM, and an additional 1 tablet at bedtime as needed for symptoms   CINNAMON PO Take 1 capsule by mouth in the morning.   Cymbalta 60 MG capsule Generic drug: DULoxetine TAKE 1 CAPSULE DAILY What changed:  how much to take when to take this   gabapentin 400 MG capsule Commonly known as: NEURONTIN Take 1 capsule (400 mg total) by mouth 3 (three) times daily as needed (for leg pain or soreness). What changed: See the new instructions.   guaiFENesin-dextromethorphan 100-10 MG/5ML  syrup Commonly known as: ROBITUSSIN DM Take 10 mLs by mouth every 4 (four) hours as needed for cough.   Ibuprofen 200 MG Caps Take 2 capsules (400 mg total) by mouth every 8 (eight) hours as needed (Leg pain, headache or mild pain). What changed: reasons to take this   lisinopril 10 MG tablet Commonly known as: ZESTRIL TAKE 1 TABLET BY MOUTH EVERY DAY What changed: when to take this   methocarbamol 750 MG tablet Commonly known as: ROBAXIN Take 750 mg by mouth every 6 (six) hours as needed for muscle spasms (or leg pain).   molnupiravir EUA 200 mg Caps capsule Commonly known as: LAGEVRIO Take 4 capsules (  800 mg total) by mouth 2 (two) times daily for 3 days.   PRESERVISION AREDS 2 PO Take 1 capsule by mouth 2 (two) times daily.   QUNOL ULTRA COQ10 PO Take 1 capsule by mouth daily.   raloxifene 60 MG tablet Commonly known as: EVISTA Take 60 mg by mouth in the morning.   ramelteon 8 MG tablet Commonly known as: ROZEREM Take 1 tablet (8 mg total) by mouth at bedtime as needed for sleep.   Vitamin D3 25 MCG (1000 UT) Chew Chew 2,500 Units by mouth daily.       Allergies  Allergen Reactions   Other Anaphylaxis    "Anti Seizure Medications"   Phenergan [Promethazine Hcl] Other (See Comments)    Makes Parkinson's worse   Acetaminophen Other (See Comments)    "I just get weird"   Itraconazole Hives   Tamsulosin Other (See Comments)    B/P DROPPED TOO MUCH!!      Procedures/Studies: CT Head Wo Contrast  Result Date: 05/16/2022 CLINICAL DATA:  Mental status change, unknown cause EXAM: CT HEAD WITHOUT CONTRAST TECHNIQUE: Contiguous axial images were obtained from the base of the skull through the vertex without intravenous contrast. RADIATION DOSE REDUCTION: This exam was performed according to the departmental dose-optimization program which includes automated exposure control, adjustment of the mA and/or kV according to patient size and/or use of iterative  reconstruction technique. COMPARISON:  06/09/2009 FINDINGS: Brain: No evidence of acute infarction, hemorrhage, hydrocephalus, extra-axial collection or mass lesion/mass effect. Scattered low-density changes within the periventricular and subcortical white matter compatible with chronic microvascular ischemic change. Mild diffuse cerebral volume loss. Vascular: Atherosclerotic calcifications involving the large vessels of the skull base. No unexpected hyperdense vessel. Skull: Normal. Negative for fracture or focal lesion. Sinuses/Orbits: No acute finding. Other: None. IMPRESSION: 1. No acute intracranial abnormality. 2. Mild chronic microvascular ischemic change and cerebral volume loss. Electronically Signed   By: Davina Poke D.O.   On: 05/16/2022 16:19   DG Chest Port 1 View  Result Date: 05/16/2022 CLINICAL DATA:  Cough. EXAM: PORTABLE CHEST 1 VIEW COMPARISON:  October 17, 2021. FINDINGS: The heart size and mediastinal contours are within normal limits. Both lungs are clear. The visualized skeletal structures are unremarkable. IMPRESSION: No active disease. Electronically Signed   By: Marijo Conception M.D.   On: 05/16/2022 16:17    Subjective: Interviewed and examined along with patient's spouse at bedside.  Overall feels much better.  Has intermittent bouts of cough with brown sputum and mild dyspnea during the coughing spells but otherwise no dyspnea when she is not coughing.  Eating well.  Denies chest pain, dizziness or lightheadedness.  Tremors related to Parkinson's controlled and back to her baseline.  Discharge Exam:  Vitals:   05/17/22 1610 05/17/22 2035 05/18/22 0443 05/18/22 1234  BP:  (!) 174/94 (!) 152/75 (!) 167/93  Pulse: (!) 106 (!) 103 98 90  Resp:  '20 17 18  '$ Temp:  99.5 F (37.5 C) 97.6 F (36.4 C) 98.4 F (36.9 C)  TempSrc:   Oral Oral  SpO2: 94% 94% 96% 95%  Weight:      Height:        General: Pleasant elderly female, moderately built and nourished sitting up  comfortably in chair without distress. Cardiovascular: S1 & S2 heard, RRR, S1/S2 +. No murmurs, rubs, gallops or clicks. No JVD or pedal edema.  Not on telemetry. Respiratory: Clear to auscultation without wheezing, rhonchi or crackles. No increased work of breathing.  Abdominal:  Non distended, non tender & soft. No organomegaly or masses appreciated. Normal bowel sounds heard. CNS: Alert and oriented. No focal deficits. Extremities: no edema, no cyanosis.  No tremors noted.    The results of significant diagnostics from this hospitalization (including imaging, microbiology, ancillary and laboratory) are listed below for reference.     Microbiology: Recent Results (from the past 240 hour(s))  Culture, blood (routine x 2)     Status: None (Preliminary result)   Collection Time: 05/16/22  3:35 PM   Specimen: BLOOD  Result Value Ref Range Status   Specimen Description   Final    BLOOD BLOOD LEFT HAND Performed at [facility], [place] [address] [place], [place] [address]    Special Requests   Final    BOTTLES DRAWN AEROBIC AND ANAEROBIC Blood Culture adequate volume Performed at [facility] [address] [place], [place] [address]    Culture  Setup Time   Final    GRAM POSITIVE RODS ANAEROBIC BOTTLE ONLY CRITICAL RESULT CALLED TO, READ BACK BY AND VERIFIED WITH: PHARMD NATHAN BATCHELDER [id number] AT 6314 BY EC GRAM POSITIVE COCCI IN CLUSTERS BOTTLES DRAWN AEROBIC ONLY CRITICAL RESULT CALLED TO, READ BACK BY AND VERIFIED WITH: PHARMD DREW WOFFORD @ 05/17/22 @ 2017 BY DRT    Culture   Final    GRAM POSITIVE RODS REFFERED TO STATE LAB FOR IDENTIFICATION STAPHYLOCOCCUS EPIDERMIDIS THE SIGNIFICANCE OF ISOLATING THIS ORGANISM FROM A SINGLE SET OF BLOOD CULTURES WHEN MULTIPLE SETS ARE DRAWN IS UNCERTAIN. PLEASE NOTIFY THE MICROBIOLOGY DEPARTMENT WITHIN ONE WEEK IF SPECIATION AND SENSITIVITIES ARE REQUIRED. Performed at [facility], [place] [address], [place], [place] [address]    Report Status PENDING  Incomplete  Blood Culture ID Panel (Reflexed)     Status: Abnormal   Collection Time: 05/16/22  3:35 PM  Result Value Ref Range Status   Enterococcus faecalis NOT DETECTED NOT DETECTED Final   Enterococcus Faecium NOT DETECTED NOT DETECTED Final   Listeria monocytogenes NOT DETECTED NOT DETECTED Final   Staphylococcus species DETECTED (A) NOT DETECTED Final    Comment: CRITICAL RESULT CALLED TO, READ BACK BY AND VERIFIED WITH: PHARMD DREW WOFFORD @ 05/17/22 @ 2017 BY DRT    Staphylococcus aureus (BCID) NOT DETECTED NOT DETECTED Final   Staphylococcus epidermidis DETECTED (A) NOT DETECTED Final    Comment: Methicillin (oxacillin) resistant coagulase negative staphylococcus. Possible blood culture contaminant (unless isolated from more than one blood culture draw or clinical case suggests pathogenicity). No antibiotic treatment is indicated for blood  culture contaminants. CRITICAL RESULT CALLED TO, READ BACK BY AND VERIFIED WITH: PHARMD DREW WOFFORD @ 05/17/22 @ 2017 BY DRT    Staphylococcus lugdunensis NOT DETECTED NOT DETECTED Final   Streptococcus species NOT DETECTED NOT DETECTED Final   Streptococcus agalactiae NOT DETECTED NOT DETECTED Final   Streptococcus pneumoniae NOT DETECTED NOT DETECTED Final   Streptococcus pyogenes NOT DETECTED NOT DETECTED Final   A.calcoaceticus-baumannii NOT DETECTED NOT DETECTED Final   Bacteroides fragilis NOT DETECTED NOT DETECTED Final   Enterobacterales NOT DETECTED NOT DETECTED Final   Enterobacter cloacae complex NOT DETECTED NOT DETECTED Final   Escherichia coli NOT DETECTED NOT DETECTED Final   Klebsiella aerogenes NOT DETECTED NOT DETECTED Final   Klebsiella oxytoca NOT DETECTED NOT DETECTED Final   Klebsiella pneumoniae NOT DETECTED NOT DETECTED Final   Proteus species NOT DETECTED NOT DETECTED Final   Salmonella species NOT DETECTED NOT DETECTED Final  Serratia marcescens NOT  DETECTED NOT DETECTED Final   Haemophilus influenzae NOT DETECTED NOT DETECTED Final   Neisseria meningitidis NOT DETECTED NOT DETECTED Final   Pseudomonas aeruginosa NOT DETECTED NOT DETECTED Final   Stenotrophomonas maltophilia NOT DETECTED NOT DETECTED Final   Candida albicans NOT DETECTED NOT DETECTED Final   Candida auris NOT DETECTED NOT DETECTED Final   Candida glabrata NOT DETECTED NOT DETECTED Final   Candida krusei NOT DETECTED NOT DETECTED Final   Candida parapsilosis NOT DETECTED NOT DETECTED Final   Candida tropicalis NOT DETECTED NOT DETECTED Final   Cryptococcus neoformans/gattii NOT DETECTED NOT DETECTED Final   Methicillin resistance mecA/C DETECTED (A) NOT DETECTED Final    Comment: CRITICAL RESULT CALLED TO, READ BACK BY AND VERIFIED WITH: PHARMD DREW WOFFORD @ 05/17/22 @ 2017 BY DRT Performed at [facility], [place] [address], [place], [place] [address]   Resp Panel by RT-PCR (Flu A&B, Covid) Anterior Nasal Swab     Status: Abnormal   Collection Time: 05/16/22  3:45 PM   Specimen: Anterior Nasal Swab  Result Value Ref Range Status   SARS Coronavirus 2 by RT PCR POSITIVE (A) NEGATIVE Final    Comment: (NOTE) SARS-CoV-2 target nucleic acids are DETECTED.  The SARS-CoV-2 RNA is generally detectable in upper respiratory specimens during the acute phase of infection. Positive results are indicative of the presence of the identified virus, but do not rule out bacterial infection or co-infection with other pathogens not detected by the test. Clinical correlation with patient history and other diagnostic information is necessary to determine patient infection status. The expected result is Negative.  Fact Sheet for Patients: EntrepreneurPulse.com.au  Fact Sheet for Healthcare Providers: IncredibleEmployment.be  This test is not yet approved or cleared by the [place] FDA and  has been authorized for detection and/or  diagnosis of SARS-CoV-2 by FDA under an Emergency Use Authorization (EUA).  This EUA will remain in effect (meaning this test can be used) for the duration of  the COVID-19 declaration under Section 564(b)(1) of the A ct, 21 U.S.C. section 360bbb-3(b)(1), unless the authorization is terminated or revoked sooner.     Influenza A by PCR NEGATIVE NEGATIVE Final   Influenza B by PCR NEGATIVE NEGATIVE Final    Comment: (NOTE) The Xpert Xpress SARS-CoV-2/FLU/RSV plus assay is intended as an aid in the diagnosis of influenza from Nasopharyngeal swab specimens and should not be used as a sole basis for treatment. Nasal washings and aspirates are unacceptable for Xpert Xpress SARS-CoV-2/FLU/RSV testing.  Fact Sheet for Patients: EntrepreneurPulse.com.au  Fact Sheet for Healthcare Providers: IncredibleEmployment.be  This test is not yet approved or cleared by the [place] FDA and has been authorized for detection and/or diagnosis of SARS-CoV-2 by FDA under an Emergency Use Authorization (EUA). This EUA will remain in effect (meaning this test can be used) for the duration of the COVID-19 declaration under Section 564(b)(1) of the Act, 21 U.S.C. section 360bbb-3(b)(1), unless the authorization is terminated or revoked.  Performed at [facility], [place] [address] [place], [place] [address]   Culture, blood (routine x 2)     Status: None (Preliminary result)   Collection Time: 05/16/22  3:45 PM   Specimen: BLOOD  Result Value Ref Range Status   Specimen Description   Final    BLOOD RIGHT ANTECUBITAL Performed at [facility] [address] [place], [place] [address]    Special Requests   Final    BOTTLES  DRAWN AEROBIC AND ANAEROBIC Blood Culture adequate volume Performed at [facility] [address] [place], [place] [address]    Culture   Final    NO GROWTH 2 DAYS Performed at  [facility] [address] [place], [place] [address]    Report Status PENDING  Incomplete  Culture, blood (Routine X 2) w Reflex to ID Panel     Status: None (Preliminary result)   Collection Time: 05/18/22  5:52 AM   Specimen: BLOOD LEFT ARM  Result Value Ref Range Status   Specimen Description   Final    BLOOD LEFT ARM Performed at [facility] [address], [place], [place] [address]    Special Requests   Final    BOTTLES DRAWN AEROBIC AND ANAEROBIC Blood Culture adequate volume Performed at [name] [address], [place], [place] [address]    Culture   Final    NO GROWTH < 12 HOURS Performed at [facility] [address], [place], [place] [address]    Report Status PENDING  Incomplete  Culture, blood (Routine X 2) w Reflex to ID Panel     Status: None (Preliminary result)   Collection Time: 05/18/22  5:57 AM   Specimen: BLOOD RIGHT HAND  Result Value Ref Range Status   Specimen Description   Final    BLOOD RIGHT HAND Performed at [facility] [address], [place], [place] [address]    Special Requests   Final    BOTTLES DRAWN AEROBIC AND ANAEROBIC Blood Culture adequate volume Performed at [facility] [address] [place], [place] [address]    Culture   Final    NO GROWTH < 12 HOURS Performed at [facility] [address] [place],  [address]    Report Status PENDING  Incomplete     Labs: CBC: Recent Labs  Lab 05/16/22 1545 05/17/22 0525 05/18/22 0552  WBC 12.8* 12.1* 6.9  NEUTROABS 10.6* 10.1* 4.4  HGB 11.4* 11.5* 11.5*  HCT 35.9* 35.2* 36.5  MCV 91.8 92.1 92.2  PLT 200 202 440    Basic Metabolic Panel: Recent Labs  Lab 05/16/22 1545 05/17/22 0525 05/18/22 0552  NA 139 139 141  K 3.7 3.6 3.4*  CL 105 106 106  CO2 '27 23 27  '$ GLUCOSE 102* 113* 99  BUN '16 10 11  '$ CREATININE 0.76 0.65 0.64  CALCIUM 8.4* 8.6* 8.8*  MG  --   2.0  --     Liver Function Tests: Recent Labs  Lab 05/16/22 1545 05/17/22 0525 05/18/22 0552  AST 14* 16 13*  ALT '6 10 10  '$ ALKPHOS 85 85 74  BILITOT 0.7 0.6 0.6  PROT 6.5 6.4* 6.3*  ALBUMIN 3.4* 3.3* 3.0*    CBG: Recent Labs  Lab 05/16/22 1532  GLUCAP 108*     Urinalysis    Component Value Date/Time   COLORURINE YELLOW 05/16/2022 [place] 05/16/2022 1743   LABSPEC 1.013 05/16/2022 1743   PHURINE 6.0 05/16/2022 1743   GLUCOSEU NEGATIVE 05/16/2022 1743   HGBUR NEGATIVE 05/16/2022 1743   BILIRUBINUR NEGATIVE 05/16/2022 1743   KETONESUR 5 (A) 05/16/2022 1743   PROTEINUR NEGATIVE 05/16/2022 1743   UROBILINOGEN 0.2 09/03/2008 0856   NITRITE NEGATIVE 05/16/2022 1743   LEUKOCYTESUR SMALL (A) 05/16/2022 1743    I discussed in detail with patient's spouse at bedside and subsequently via phone.  Updated care  and answered all questions.  Discussed with him regarding duplication of benzodiazepines and he indicated that she did not take clonazepam which was removed from her medication list.  Time coordinating discharge: 25 minutes  SIGNED:  Vernell Leep, MD,  FACP, [facility], [facility], [facility] (Care Management Physician Certified). [facility]  To contact the attending provider between 7A-7P or the covering provider during after hours 7P-7A, please log into the web site [email or web address] and access using universal [place] password for that web site. If you do not have the password, please call the hospital operator.

## 2022-05-18 NOTE — TOC Initial Note (Signed)
Transition of Care [facility]) - Initial/Assessment Note    Patient Details  Name: Morgan [name] MRN: [id number] Date of Birth: [birth year]  Transition of Care [facility]) CM/SW Contact:    Vassie Moselle, LCSW Phone Number: 05/18/2022, 10:00 AM  Clinical Narrative:                 Met with pt and confirmed plan for home health services. Pt reports she has had home health services in the past but, is unsure with which agency. She states she had great service and would like home health through them again. After reviewing pt's chart pt had home health through [facility] in the past. HHPT/OT has been arranged with [facility]. HH orders will need to be placed prior to discharge. No DME needs identified.    Expected Discharge Plan: [place] Barriers to Discharge: No Barriers Identified   Patient Goals and CMS Choice Patient states their goals for this hospitalization and ongoing recovery are:: To return home   Choice offered to / list presented to : Patient  Expected Discharge Plan and Services Expected Discharge Plan: [place] In-house Referral: NA Discharge Planning Services: NA Post Acute Care Choice: [place] arrangements for the past 2 months: Single Family Home                 DME Arranged: N/A DME Agency: NA       HH Arranged: PT, OT HH Agency: [place] Date [place]: 05/18/22 Time HH Agency Contacted: [phone] Representative spoke with at [place]: Claiborne Billings  Prior Living Arrangements/Services Living arrangements for the past 2 months: [place] with:: Spouse Patient language and need for interpreter reviewed:: Yes Do you feel safe going back to the place where you live?: Yes      Need for Family Participation in Patient Care: No (Comment) Care giver support system in place?: No (comment) Current home services: DME Criminal Activity/Legal Involvement Pertinent to Current  Situation/Hospitalization: No - Comment as needed  Activities of Daily Living Home Assistive Devices/Equipment: Shower chair with back ADL Screening (condition at time of admission) Patient's cognitive ability adequate to safely complete daily activities?: Yes Is the patient deaf or have difficulty hearing?: No Does the patient have difficulty seeing, even when wearing glasses/contacts?: No Does the patient have difficulty concentrating, remembering, or making decisions?: No Patient able to express need for assistance with ADLs?: Yes Does the patient have difficulty dressing or bathing?: No Independently performs ADLs?: Yes (appropriate for developmental age) Does the patient have difficulty walking or climbing stairs?: Yes Weakness of Legs: Both Weakness of Arms/Hands: None  Permission Sought/Granted Permission sought to share information with : Case Manager, Family Supports Permission granted to share information with : Yes, Verbal Permission Granted  Share Information with NAME: Leni Pankonin     Permission granted to share info w Relationship: Spouse  Permission granted to share info w Contact Information: [phone]  Emotional Assessment Appearance:: Appears stated age Attitude/Demeanor/Rapport: Engaged Affect (typically observed): Accepting Orientation: : Oriented to Self, Oriented to Place, Oriented to  Time, Oriented to Situation Alcohol / Substance Use: Not Applicable Psych Involvement: No (comment)  Admission diagnosis:  Weakness [R53.1] COVID [U07.1] COVID-19 [U07.1] Patient Active Problem List   Diagnosis Date Noted   COVID-19 virus infection 05/16/2022   HTN (hypertension) 10/20/2021   Physical deconditioning 10/20/2021   Hydronephrosis with infection 10/18/2021   Bacteremia due to Escherichia coli 10/18/2021  Severe sepsis ([place]) 10/17/2021   UTI (urinary tract infection) 82/80/0349   Acute metabolic encephalopathy 17/91/5056   Parkinson disease ([place])  10/17/2021   Cervical stenosis (uterine cervix) 02/14/2018   Postmenopausal bleeding 02/14/2018   LBBB (left bundle branch block) 12/25/2015   PVCs (premature ventricular contractions) 12/25/2015   Hyperlipidemia 12/25/2015   Fibromyalgia 01/16/2014   Cholelithiasis 04/25/2013   Paralysis agitans ([place]) 01/15/2013   Unspecified hereditary and idiopathic peripheral neuropathy 01/15/2013   PCP:  [name], MD Pharmacy:   [id number] [id number]: [phone]: [phone]    Social Determinants of Health (SDOH) Interventions    Readmission Risk Interventions     No data to display

## 2022-05-18 NOTE — Discharge Instructions (Signed)

## 2022-05-18 NOTE — Progress Notes (Signed)
Occupational Therapy Treatment Patient Details Name: Morgan [name] MRN: [id number] DOB: April 01, 1951 Today's Date: 05/18/2022   History of present illness 71 year old female with history of Parkinson's disease, HTN, HLD, came home 2 days prior to hospitalization from a cruise, has been having intermittent confusion for the last 3 days, was coughing, had fever and chills and was extremely weak.  She was brought to the hospital and diagnosed with COVID-19.   OT comments  Pt presented with good participation. She performed bed mobility, sit to stand, and ambulating in her room with supervision. She was educated on the importance of maintaining regular functional activity, benefits of implementing deep breathing exercises, and general energy conservation strategies. She is progressing towards her OT goals.    Recommendations for follow up therapy are one component of a multi-disciplinary discharge planning process, led by the attending physician.  Recommendations may be updated based on patient status, additional functional criteria and insurance authorization.    Follow Up Recommendations  No OT follow up    Assistance Recommended at Discharge Intermittent Supervision/Assistance  Patient can return home with the following  Help with stairs or ramp for entrance;A little help with bathing/dressing/bathroom;Assist for transportation;A little help with walking and/or transfers   Equipment Recommendations  None recommended by OT       Precautions / Restrictions Precautions Precautions: Fall Precaution Comments: contact/airborne Restrictions Weight Bearing Restrictions: No       Mobility Bed Mobility Overal bed mobility: Needs Assistance Bed Mobility: Supine to Sit, Sit to Supine     Supine to sit: Supervision Sit to supine: Supervision        Transfers Overall transfer level: Needs assistance Equipment used: None Transfers: Sit to/from Stand Sit to Stand: Supervision                      ADL either performed or assessed with clinical judgement   ADL   Eating/Feeding: Independent Eating/Feeding Details (indicate cue type and reason): based on clinical judgement                        Cognition   Behavior During Therapy: [facility] for tasks assessed/performed Overall Cognitive Status: Within Functional Limits for tasks assessed                            Pertinent Vitals/ Pain       Pain Assessment Pain Assessment: No/denies pain         Frequency  Min 2X/week        Progress Toward Goals  OT Goals(current goals can now be found in the care plan section)  Progress towards OT goals: Progressing toward goals  Acute Rehab OT Goals Time For Goal Achievement: 05/30/22 Potential to Achieve Goals: Good  Plan         AM-PAC OT "6 Clicks" Daily Activity     Outcome Measure   Help from another person eating meals?: None Help from another person taking care of personal grooming?: None Help from another person toileting, which includes using toliet, bedpan, or urinal?: A Little Help from another person bathing (including washing, rinsing, drying)?: A Little Help from another person to put on and taking off regular upper body clothing?: None Help from another person to put on and taking off regular lower body clothing?: A Little 6 Click Score: 21    End of Session    OT Visit Diagnosis: Unsteadiness  on feet (R26.81);Muscle weakness (generalized) (M62.81)   Activity Tolerance Patient tolerated treatment well   Patient Left in bed;with call bell/phone within reach           Time: 1344-1359 OT Time Calculation (min): 15 min  Charges: OT General Charges $OT Visit: 1 Visit OT Treatments $Therapeutic Activity: 8-22 mins    Leota Sauers, OTR/L 05/18/2022, 2:16 PM

## 2022-05-18 NOTE — Progress Notes (Signed)
Verbal and written AVS discharge instructions given to Morgan [name] and Morgan [name],husband who is in room. Pt and husband verbalized understandment of discharge instructions. Pt aware to follow up with PCP for lab draw and to pick up prescriptions. Wheelchair to lobby for DC to home.

## 2022-05-19 LAB — CULTURE, BLOOD (ROUTINE X 2): Special Requests: ADEQUATE

## 2022-05-20 DIAGNOSIS — M797 Fibromyalgia: Secondary | ICD-10-CM | POA: Diagnosis not present

## 2022-05-20 DIAGNOSIS — Z8701 Personal history of pneumonia (recurrent): Secondary | ICD-10-CM | POA: Diagnosis not present

## 2022-05-20 DIAGNOSIS — D649 Anemia, unspecified: Secondary | ICD-10-CM | POA: Diagnosis not present

## 2022-05-20 DIAGNOSIS — I447 Left bundle-branch block, unspecified: Secondary | ICD-10-CM | POA: Diagnosis not present

## 2022-05-20 DIAGNOSIS — E7800 Pure hypercholesterolemia, unspecified: Secondary | ICD-10-CM | POA: Diagnosis not present

## 2022-05-20 DIAGNOSIS — F419 Anxiety disorder, unspecified: Secondary | ICD-10-CM | POA: Diagnosis not present

## 2022-05-20 DIAGNOSIS — N882 Stricture and stenosis of cervix uteri: Secondary | ICD-10-CM | POA: Diagnosis not present

## 2022-05-20 DIAGNOSIS — G20 Parkinson's disease: Secondary | ICD-10-CM | POA: Diagnosis not present

## 2022-05-20 DIAGNOSIS — M1990 Unspecified osteoarthritis, unspecified site: Secondary | ICD-10-CM | POA: Diagnosis not present

## 2022-05-20 DIAGNOSIS — K8020 Calculus of gallbladder without cholecystitis without obstruction: Secondary | ICD-10-CM | POA: Diagnosis not present

## 2022-05-20 DIAGNOSIS — Z87891 Personal history of nicotine dependence: Secondary | ICD-10-CM | POA: Diagnosis not present

## 2022-05-20 DIAGNOSIS — I10 Essential (primary) hypertension: Secondary | ICD-10-CM | POA: Diagnosis not present

## 2022-05-20 DIAGNOSIS — Z87440 Personal history of urinary (tract) infections: Secondary | ICD-10-CM | POA: Diagnosis not present

## 2022-05-20 DIAGNOSIS — M8580 Other specified disorders of bone density and structure, unspecified site: Secondary | ICD-10-CM | POA: Diagnosis not present

## 2022-05-20 DIAGNOSIS — Z9181 History of falling: Secondary | ICD-10-CM | POA: Diagnosis not present

## 2022-05-20 DIAGNOSIS — U071 COVID-19: Secondary | ICD-10-CM | POA: Diagnosis not present

## 2022-05-20 DIAGNOSIS — Z85820 Personal history of malignant melanoma of skin: Secondary | ICD-10-CM | POA: Diagnosis not present

## 2022-05-20 DIAGNOSIS — Z7982 Long term (current) use of aspirin: Secondary | ICD-10-CM | POA: Diagnosis not present

## 2022-05-20 DIAGNOSIS — G609 Hereditary and idiopathic neuropathy, unspecified: Secondary | ICD-10-CM | POA: Diagnosis not present

## 2022-05-21 LAB — CULTURE, BLOOD (ROUTINE X 2)
Culture: NO GROWTH
Special Requests: ADEQUATE

## 2022-05-23 DIAGNOSIS — M1990 Unspecified osteoarthritis, unspecified site: Secondary | ICD-10-CM | POA: Diagnosis not present

## 2022-05-23 DIAGNOSIS — G20 Parkinson's disease: Secondary | ICD-10-CM | POA: Diagnosis not present

## 2022-05-23 DIAGNOSIS — U071 COVID-19: Secondary | ICD-10-CM | POA: Diagnosis not present

## 2022-05-23 DIAGNOSIS — I10 Essential (primary) hypertension: Secondary | ICD-10-CM | POA: Diagnosis not present

## 2022-05-23 DIAGNOSIS — M797 Fibromyalgia: Secondary | ICD-10-CM | POA: Diagnosis not present

## 2022-05-23 DIAGNOSIS — D649 Anemia, unspecified: Secondary | ICD-10-CM | POA: Diagnosis not present

## 2022-05-24 ENCOUNTER — Ambulatory Visit: Payer: Medicare Other | Admitting: Family Medicine

## 2022-05-24 DIAGNOSIS — G20 Parkinson's disease: Secondary | ICD-10-CM | POA: Diagnosis not present

## 2022-05-24 DIAGNOSIS — D649 Anemia, unspecified: Secondary | ICD-10-CM | POA: Diagnosis not present

## 2022-05-24 DIAGNOSIS — M1990 Unspecified osteoarthritis, unspecified site: Secondary | ICD-10-CM | POA: Diagnosis not present

## 2022-05-24 DIAGNOSIS — M797 Fibromyalgia: Secondary | ICD-10-CM | POA: Diagnosis not present

## 2022-05-24 DIAGNOSIS — U071 COVID-19: Secondary | ICD-10-CM | POA: Diagnosis not present

## 2022-05-24 DIAGNOSIS — I10 Essential (primary) hypertension: Secondary | ICD-10-CM | POA: Diagnosis not present

## 2022-05-24 LAB — CULTURE, BLOOD (ROUTINE X 2)
Culture: NO GROWTH
Culture: NO GROWTH
Special Requests: ADEQUATE
Special Requests: ADEQUATE

## 2022-05-26 DIAGNOSIS — U071 COVID-19: Secondary | ICD-10-CM | POA: Diagnosis not present

## 2022-05-26 DIAGNOSIS — G20 Parkinson's disease: Secondary | ICD-10-CM | POA: Diagnosis not present

## 2022-05-26 DIAGNOSIS — D649 Anemia, unspecified: Secondary | ICD-10-CM | POA: Diagnosis not present

## 2022-05-26 DIAGNOSIS — M797 Fibromyalgia: Secondary | ICD-10-CM | POA: Diagnosis not present

## 2022-05-26 DIAGNOSIS — M1990 Unspecified osteoarthritis, unspecified site: Secondary | ICD-10-CM | POA: Diagnosis not present

## 2022-05-26 DIAGNOSIS — I10 Essential (primary) hypertension: Secondary | ICD-10-CM | POA: Diagnosis not present

## 2022-05-30 DIAGNOSIS — U071 COVID-19: Secondary | ICD-10-CM | POA: Diagnosis not present

## 2022-05-30 DIAGNOSIS — I10 Essential (primary) hypertension: Secondary | ICD-10-CM | POA: Diagnosis not present

## 2022-05-30 DIAGNOSIS — M797 Fibromyalgia: Secondary | ICD-10-CM | POA: Diagnosis not present

## 2022-05-30 DIAGNOSIS — D649 Anemia, unspecified: Secondary | ICD-10-CM | POA: Diagnosis not present

## 2022-05-30 DIAGNOSIS — M1990 Unspecified osteoarthritis, unspecified site: Secondary | ICD-10-CM | POA: Diagnosis not present

## 2022-05-30 DIAGNOSIS — G20 Parkinson's disease: Secondary | ICD-10-CM | POA: Diagnosis not present

## 2022-05-31 ENCOUNTER — Telehealth: Payer: Self-pay

## 2022-05-31 NOTE — Patient Outreach (Signed)
  Care Coordination   Initial Visit Note   05/31/2022 Name: BRITTNY SPANGLE MRN: [id number] DOB: 1950/11/12  Vivi Barrack Jacko is a 71 y.o. year old female who sees [name], MD for primary care. I spoke with  Damian L Ayad by phone today and spouse [name].  What matters to the patients health and wellness today?  Spouse reports needing help with setting up respite for patient. Patient has [employer] that will pay per spouse.  Patient better since hospitalization.  [place] involved.   Goals Addressed             This Visit's Progress    Assistance with Respite Care       Care Coordination Interventions: Advised patient to Patient follow up with physician 06-02-22 Social Work referral for help with respite care set up          SDOH assessments and interventions completed:  Yes     Care Coordination Interventions Activated:  Yes  Care Coordination Interventions:  Yes, provided   Follow up plan: Follow up call scheduled for October 24th    Encounter Outcome:  Pt. Visit Completed   Jone Baseman, RN, MSN [place] Management Care Management Coordinator Direct Line [phone]

## 2022-05-31 NOTE — Patient Instructions (Signed)
Visit Information  Thank you for taking time to visit with me today. Please don't hesitate to contact me if I can be of assistance to you.   Following are the goals we discussed today:   Goals Addressed             This Visit's Progress    Assistance with Respite Care       Care Coordination Interventions: Advised patient to Patient follow up with physician 06-02-22 Social Work referral for help with respite care set up          [place] next appointment is by telephone on October 24th at 10am  Please call the care guide team at [phone] if you need to cancel or reschedule your appointment.   If you are experiencing a Mental Health or [place] or need someone to talk to, please call the Suicide and Crisis Lifeline: 988   The patient verbalized understanding of instructions, educational materials, and care plan provided today and agreed to receive a mailed copy of patient instructions, educational materials, and care plan.   Telephone follow up appointment with care management team member scheduled for: October 24th  Jone Baseman, [place], MSN [place] Management Care Management Coordinator Direct Line [phone]

## 2022-05-31 NOTE — Progress Notes (Unsigned)
Morgan [name] Phone: [phone] Subjective:   Morgan [name], am serving as a scribe for Dr. Hulan Saas.  I'm seeing this patient by the request  of:  [name], MD  CC: bilateral leg pain   PXT:[id number]  Morgan [name] is a 71 y.o. female coming in with complaint of chronic bilateral leg pain with a past medical history significant for Parkinson's disease.  Patient has had a fall recently in September that did cause patient to go to the emergency room.  Patient then 2 weeks later was admitted again for worsening COVID, weakness, as well as dyskinesis secondary to her Parkinson's disease. Pain in her legs are in quads and calf. Weather seems to make pain in legs worse. Tries to stretch and use IBU, gabapentin for pain relief. Does feel like muscles will be weak when painful.        Past Medical History:  Diagnosis Date   Anxiety    Fibromyalgia    Headache    Hypercholesteremia    controlled on medication   Hyperlipidemia    Hypertension    [name] current meds   LBBB (left bundle branch block)    Melanoma (HCC)    MVP (mitral valve prolapse)    Osteopenia    Parkinson disease    Pneumonia    SVD (spontaneous vaginal delivery)    x 2   Past Surgical History:  Procedure Laterality Date   BLEPHAROPLASTY     cold knife conization     COLONOSCOPY     FH colon CA   CYSTOSCOPY W/ URETERAL STENT PLACEMENT Left 10/18/2021   Procedure: CYSTOSCOPY WITH RETROGRADE PYELOGRAM, URETERAL STENT PLACEMENT;  Surgeon: Festus Aloe, MD;  Location: [facility];  Service: Urology;  Laterality: Left;   DILATATION & CURETTAGE/HYSTEROSCOPY WITH MYOSURE N/A 02/14/2018   Procedure: ATTEMPTED DILATATION & CURETTAGE/HYSTEROSCOPY WITH MYOSURE;  Surgeon: Christophe Louis, MD;  Location: [facility];  Service: Gynecology;  Laterality: N/A;  polyp   melanoma cancer     removed from left leg   RECONSTRUCTION OF EYELID      TONSILLECTOMY     TUBAL LIGATION     URETEROSCOPY WITH HOLMIUM LASER LITHOTRIPSY Left 12/17/2021   Procedure: URETEROSCOPY/HOLMIUM LASER/STENT EXCHANGE;  Surgeon: Festus Aloe, MD;  Location: [facility];  Service: Urology;  Laterality: Left;   WISDOM TOOTH EXTRACTION     Social History   Socioeconomic History   Marital status: Married    Spouse name: Not on file   Number of children: 2   Years of education: Not on file   Highest education level: Bachelor's degree (e.g., BA, AB, BS)  Occupational History   Occupation: retired    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  Tobacco Use   Smoking status: Former    Packs/day: 0.25    Types: Cigarettes    Quit date: 09/29/1997    Years since quitting: 24.6   Smokeless tobacco: Never   Tobacco comments:    chews [place], 10-12/day  Vaping Use   Vaping Use: Never used  Substance and Sexual Activity   Alcohol use: Yes    Comment: Occasional wine   Drug use: [name]   Sexual activity: Not on file  Other Topics Concern   Not on file  Social History Narrative   right handed   2 story home    Lives with spouse   Social Determinants of Health   Financial Resource Strain: Not on file  Food Insecurity: [name] Food Insecurity (05/17/2022)   Hunger Vital Sign    Worried About Running Out of Food in the Last Year: Never true    Ran Out of Food in the Last Year: Never true  Transportation Needs: [name] Transportation Needs (05/31/2022)   PRAPARE - Hydrologist (Medical): [name]    Lack of Transportation (Non-Medical): [name]  Physical Activity: Not on file  Stress: Not on file  Social Connections: Not on file   Allergies  Allergen Reactions   Other Anaphylaxis    "Anti Seizure Medications"   Phenergan [Promethazine Hcl] Other (See Comments)    Makes Parkinson's worse   Acetaminophen Other (See Comments)    "I just get weird"   Itraconazole Hives   Tamsulosin Other (See Comments)    B/P DROPPED TOO MUCH!!   Family History   Problem Relation Age of Onset   Cancer Mother        colon   Healthy Brother    Healthy Sister    Healthy Child     Current Outpatient Medications (Endocrine & Metabolic):    raloxifene (EVISTA) 60 MG tablet, Take 60 mg by mouth in the morning.  Current Outpatient Medications (Cardiovascular):    atorvastatin (LIPITOR) 10 MG tablet, Take 10 mg by mouth in the morning.   lisinopril (ZESTRIL) 10 MG tablet, TAKE 1 TABLET BY MOUTH EVERY DAY (Patient taking differently: Take 10 mg by mouth in the morning.)  Current Outpatient Medications (Respiratory):    guaiFENesin-dextromethorphan (ROBITUSSIN DM) 100-10 MG/5ML syrup, Take 10 mLs by mouth every 4 (four) hours as needed for cough.  Current Outpatient Medications (Analgesics):    aspirin EC 81 MG tablet, Take 81 mg by mouth daily. Swallow whole.   Ibuprofen 200 MG CAPS, Take 2 capsules (400 mg total) by mouth every 8 (eight) hours as needed (Leg pain, headache or mild pain).   Current Outpatient Medications (Other):    ALPRAZolam (XANAX) 0.25 MG tablet, Take 0.25 mg by mouth daily as needed for anxiety.    BLINK TEARS 0.25 % SOLN, Place 1 drop into both eyes 3 (three) times daily as needed (for dryness).   carbidopa-levodopa (SINEMET IR) 25-100 MG tablet, Take 1 tablet by mouth See admin instructions. Take 1 tablet by mouth at 6 AM, 8 AM, 10 AM, 12 NOON, 2 PM, 4 PM, 6 PM, and an additional 1 tablet at bedtime as needed for symptoms   Cholecalciferol (VITAMIN D3) 25 MCG (1000 UT) CHEW, Chew 2,500 Units by mouth daily.   CINNAMON PO, Take 1 capsule by mouth in the morning.   Coenzyme Q10-Vitamin E (QUNOL ULTRA COQ10 PO), Take 1 capsule by mouth daily.   CYMBALTA 60 MG capsule, TAKE 1 CAPSULE DAILY (Patient taking differently: Take 60 mg by mouth in the morning.)   gabapentin (NEURONTIN) 400 MG capsule, Take 1 capsule (400 mg total) by mouth 3 (three) times daily as needed (for leg pain or soreness).   methocarbamol (ROBAXIN) 750 MG tablet,  Take 750 mg by mouth every 6 (six) hours as needed for muscle spasms (or leg pain).   Multiple Vitamins-Minerals (PRESERVISION AREDS 2 PO), Take 1 capsule by mouth 2 (two) times daily.   ramelteon (ROZEREM) 8 MG tablet, Take 1 tablet (8 mg total) by mouth at bedtime as needed for sleep.   Reviewed prior external information including notes and imaging from  primary care provider As well as notes that were available from care everywhere and other  healthcare systems.  Past medical history, social, surgical and family history all reviewed in electronic medical record.  [name] pertanent information unless stated regarding to the chief complaint.   Review of Systems:  [name] headache, visual changes, nausea, vomiting, diarrhea, constipation, dizziness, abdominal pain, skin rash, fevers, chills, night sweats, weight loss, swollen lymph nodes, body aches, joint swelling, chest pain, shortness of breath, mood changes. POSITIVE muscle aches  Objective  There were [name] vitals taken for this visit.   General: [name] apparent distress alert and oriented x3 mood and affect normal, dressed appropriately.  HEENT: Pupils equal, extraocular movements intact  Respiratory: Patient's speak in full sentences and does not appear short of breath  Cardiovascular: [name] lower extremity edema, non tender, [name] erythema      Impression and Recommendations:    The above documentation has been reviewed and is accurate and complete Lyndal Pulley, DO

## 2022-06-01 ENCOUNTER — Encounter: Payer: Self-pay | Admitting: Family Medicine

## 2022-06-01 ENCOUNTER — Ambulatory Visit (INDEPENDENT_AMBULATORY_CARE_PROVIDER_SITE_OTHER): Payer: Medicare Other | Admitting: Family Medicine

## 2022-06-01 VITALS — BP 130/92 | HR 97 | Ht 65.0 in | Wt 154.0 lb

## 2022-06-01 DIAGNOSIS — G20A1 Parkinson's disease without dyskinesia, without mention of fluctuations: Secondary | ICD-10-CM | POA: Diagnosis not present

## 2022-06-01 DIAGNOSIS — R5381 Other malaise: Secondary | ICD-10-CM

## 2022-06-01 DIAGNOSIS — D649 Anemia, unspecified: Secondary | ICD-10-CM | POA: Diagnosis not present

## 2022-06-01 DIAGNOSIS — G20 Parkinson's disease: Secondary | ICD-10-CM | POA: Diagnosis not present

## 2022-06-01 DIAGNOSIS — U071 COVID-19: Secondary | ICD-10-CM | POA: Diagnosis not present

## 2022-06-01 DIAGNOSIS — I10 Essential (primary) hypertension: Secondary | ICD-10-CM | POA: Diagnosis not present

## 2022-06-01 DIAGNOSIS — M1990 Unspecified osteoarthritis, unspecified site: Secondary | ICD-10-CM | POA: Diagnosis not present

## 2022-06-01 DIAGNOSIS — M797 Fibromyalgia: Secondary | ICD-10-CM | POA: Diagnosis not present

## 2022-06-01 NOTE — Patient Instructions (Addendum)
PT Aquatic Therapy [place] '1200mg'$  at night

## 2022-06-02 ENCOUNTER — Telehealth: Payer: Self-pay

## 2022-06-02 DIAGNOSIS — E785 Hyperlipidemia, unspecified: Secondary | ICD-10-CM | POA: Diagnosis not present

## 2022-06-02 DIAGNOSIS — G20B2 Parkinson's disease with dyskinesia, with fluctuations: Secondary | ICD-10-CM | POA: Diagnosis not present

## 2022-06-02 DIAGNOSIS — I10 Essential (primary) hypertension: Secondary | ICD-10-CM | POA: Diagnosis not present

## 2022-06-02 DIAGNOSIS — F419 Anxiety disorder, unspecified: Secondary | ICD-10-CM | POA: Diagnosis not present

## 2022-06-02 NOTE — Assessment & Plan Note (Signed)
Patient does have significant tremor at baseline.  Likely no significant cogwheeling.  Patient shows no signs and symptoms concerning for any recent stroke at the moment.  I do think though patient does have abnormal gait secondary to her underlying pathology.  I do feel that further treatment with formal physical therapy for balance and coordination including patient's classes with other Parkinson patient will be the most beneficial for her.  Patient is on gabapentin and we did discuss that this could potentially contribute to some of this and may want to consider decreasing.  Patient has not been using it as prescribed anyhow at the moment.  We discussed that strengthening would be helpful.  We discussed the possibility of aquatic therapy as well.  We will add that onto the physical therapy.  Patient will come back and see me again in 4 to 6 weeks to see how she is responding to conservative therapy.

## 2022-06-02 NOTE — Patient Outreach (Addendum)
  Care Coordination   Multidisciplinary Case Review Note    06/02/2022 Name: Morgan [name] MRN: [id number] DOB: 07/31/1951  Morgan [name] is a 71 y.o. year old female who sees Morgan Ada, MD for primary care.  The  multidisciplinary care team met today to review patient care needs and barriers.     Goals Addressed             This Visit's Progress    Assistance with Respite Care       Care Coordination Interventions: Advised patient to Patient follow up with physician 06-02-22 Social Work referral for help with respite care set up   Social Worker to outreach on 06/06/22.            SDOH assessments and interventions completed:  Yes     Care Coordination Interventions Activated:  Yes   Care Coordination Interventions:  Yes, provided   Follow up plan: Follow up call scheduled for MSW to outreach 06/06/22 to assist with accessing respite, RN CM will follow up later this month    Multidisciplinary Team Attendees:   Morgan Arms, RN; Morgan Killian RN; Morgan Billings, RN and Morgan Clos, LCSW, Morgan [name], MSW  Scribe for Multidisciplinary Case Review:  Morgan Billings, RN  Jone Baseman, RN, MSN [facility] Care Management Care Management Coordinator Direct Line [phone]

## 2022-06-03 ENCOUNTER — Encounter: Payer: Self-pay | Admitting: *Deleted

## 2022-06-06 ENCOUNTER — Ambulatory Visit: Payer: Self-pay | Admitting: Licensed Clinical Social Worker

## 2022-06-06 DIAGNOSIS — G20 Parkinson's disease: Secondary | ICD-10-CM | POA: Diagnosis not present

## 2022-06-06 DIAGNOSIS — M797 Fibromyalgia: Secondary | ICD-10-CM | POA: Diagnosis not present

## 2022-06-06 DIAGNOSIS — M1990 Unspecified osteoarthritis, unspecified site: Secondary | ICD-10-CM | POA: Diagnosis not present

## 2022-06-06 DIAGNOSIS — U071 COVID-19: Secondary | ICD-10-CM | POA: Diagnosis not present

## 2022-06-06 DIAGNOSIS — D649 Anemia, unspecified: Secondary | ICD-10-CM | POA: Diagnosis not present

## 2022-06-06 DIAGNOSIS — I10 Essential (primary) hypertension: Secondary | ICD-10-CM | POA: Diagnosis not present

## 2022-06-06 NOTE — Patient Outreach (Signed)
  Care Coordination   Initial Visit Note   06/06/2022 Name: PAIDYN MCFERRAN MRN: [id number] DOB: November 15, 1950  Vivi Barrack Upadhyay is a 71 y.o. year old female who sees [name], MD for primary care. I spoke with  Candita L Mcphail by phone today.  What matters to the patients health and wellness today?  Respite Care    Goals Addressed               This Visit's Progress     MSW Respite Care Interventions (pt-stated)        SW offered to call insurance company with patient and spouse to navigate benefits. Patient and spouse stated they weren't ready to call insurance and would call at  a later time and contact SW back with information.   SW gave contact information for DHHS in home care. Patient denied assistance with PCS. SW iformed patient to inquire about respite. Patient agreed.   SW sent all contact information ( SW Psychologist, counselling, [place] customer services contact and DHHS contact via email as requested by patient).   Patient stated he denied sending spouse to rehab facilities In [place].  Patient denied needing any further assistance. Patient stated he would contact SW back when ready.         SDOH assessments and interventions completed:  Yes     Care Coordination Interventions Activated:  Yes  Care Coordination Interventions:  Yes, provided   Follow up plan: No further intervention required.   Encounter Outcome:  Pt. Visit Completed

## 2022-06-06 NOTE — Patient Instructions (Signed)
Visit Information  Thank you for taking time to visit with me today. Please don't hesitate to contact me if I can be of assistance to you.   Following are the goals we discussed today:   Goals Addressed               This Visit's Progress     MSW Respite Care Interventions (pt-stated)        SW offered to call insurance company with patient and spouse to navigate benefits. Patient and spouse stated they weren't ready to call insurance and would call at  a later time and contact SW back with information.   SW gave contact information for DHHS in home care. Patient denied assistance with PCS. SW iformed patient to inquire about respite. Patient agreed.   SW sent all contact information ( SW Psychologist, counselling, [place] customer services contact and DHHS contact via email as requested by patient).   Patient stated he denied sending spouse to rehab facilities In [place].  Patient denied needing any further assistance. Patient stated he would contact SW back when ready.           Patient verbalizes understanding of instructions and care plan provided today and agrees to view in [place]. Active MyChart status and patient understanding of how to access instructions and care plan via MyChart confirmed with patient.     No further follow up required: .  Lenor Derrick, MSW  Social Worker [facility] Care Management  [phone]

## 2022-06-08 DIAGNOSIS — M797 Fibromyalgia: Secondary | ICD-10-CM | POA: Diagnosis not present

## 2022-06-08 DIAGNOSIS — U071 COVID-19: Secondary | ICD-10-CM | POA: Diagnosis not present

## 2022-06-08 DIAGNOSIS — M1990 Unspecified osteoarthritis, unspecified site: Secondary | ICD-10-CM | POA: Diagnosis not present

## 2022-06-08 DIAGNOSIS — I10 Essential (primary) hypertension: Secondary | ICD-10-CM | POA: Diagnosis not present

## 2022-06-08 DIAGNOSIS — G20 Parkinson's disease: Secondary | ICD-10-CM | POA: Diagnosis not present

## 2022-06-08 DIAGNOSIS — D649 Anemia, unspecified: Secondary | ICD-10-CM | POA: Diagnosis not present

## 2022-06-09 ENCOUNTER — Telehealth: Payer: Self-pay | Admitting: Cardiovascular Disease

## 2022-06-09 ENCOUNTER — Telehealth: Payer: Self-pay

## 2022-06-09 NOTE — Telephone Encounter (Signed)
Left a message for the patient to call back. Patient has an appointment on 10/13

## 2022-06-09 NOTE — Telephone Encounter (Signed)
I would recommend stopping the blood pressure medications (losartan) altogether until her follow-up appointment.

## 2022-06-09 NOTE — Telephone Encounter (Signed)
Recommend stop Losartan and remain off Lisinopril. Ensure: Stay well hydrated, eat regular meals, wear compression socks if she has them, make position changes slowly.   Morgan [name] - can we please request most recent OV notes from PCP, please?  TY!  Loel Dubonnet, NP

## 2022-06-09 NOTE — Telephone Encounter (Signed)
The patient's husband has been made aware and verbalized his understanding.

## 2022-06-09 NOTE — Patient Outreach (Signed)
  Care Coordination   Multidisciplinary Case Review Note    06/09/2022 Name: Morgan [name] MRN: [id number] DOB: Apr 15, 1951  Morgan [name] is a 71 y.o. year old female who sees Morgan Ada, MD for primary care.  The  multidisciplinary care team met today to review patient care needs and barriers.     Goals Addressed             This Visit's Progress    Assistance with Respite Care       Care Coordination Interventions: Advised patient to Patient follow up with physician 06-02-22 Social Work referral for help with respite care set up   Social Worker outreach to spouse. Offered resources for respite and DSS.  Spouse states he would call social worker back.  Social work to [facility] in 2 weeks.           SDOH assessments and interventions completed:  Yes     Care Coordination Interventions Activated:  Yes   Care Coordination Interventions:  Yes, provided   Follow up plan:  Social work to follow up in 2 weeks    Multidisciplinary Team Attendees:   Morgan Arms, RN; Morgan Killian RN; Morgan Billings, RN and Morgan Clos, LCSW, Morgan [name], MSW  Morgan [name] for Multidisciplinary Case Review:  Morgan Billings, RN  Morgan Baseman, RN, MSN [facility] Care Management Care Management Coordinator Direct Line [phone]

## 2022-06-09 NOTE — Telephone Encounter (Signed)
123/71  most recent BP today   9/26 - lisinopril 10 mg 10:58 am  102/64, 92/53, passed out that day  9/27  Lisinopril 7.5 mg 9:23 114/75            Lisinopril 5  105/73  111/68           Lisinopril 5 116/79            Lisinopril 2.5 146/85, 130/92  Saw PCP- 10/6 changed to losartan 12.5 mg 119/71,   10/8 142/88 10/10 108/61, hit the deck sat on steps after exercise w PT -  97/77 --then 129/80        The patient's husband following closely.  Has adjusted lisinopril down to only 2.5 and then switched by PCP to losartan 12.5 mg in AM.  She is still having spells of almost passing out while standing up or walking.  Yesterday she was standing on deck and was able to be lowered to sit on steps so she didn't fall.  He is holding her up at the counter at times.  He is concerned w her bundle branch block and wonders if this is contributing.  She has Parkinsons too.  I asked him to really encourage fluids right now and keep an eye on her.  Seeing APP tomorrow.

## 2022-06-09 NOTE — Telephone Encounter (Signed)
FYI--Notes requested via fax. Could not get through over the phone.

## 2022-06-09 NOTE — Telephone Encounter (Signed)
Pt c/o BP issue: STAT if pt c/o blurred vision, one-sided weakness or slurred speech  1. What are your last 5 BP readings?  10/8 119/71 10/9 142/88 10/11 108/61 at 11:51am 10/11 97/77 at 12:35pm 10/11 129/80 at 2:21pm  2. Are you having any other symptoms (ex. Dizziness, headache, blurred vision, passed out)? dizziness  3. What is your BP issue? Low BP, spouse states she passed out yesterday when her BP was 97/77, they reached out to the PCP.  Her PCP took her off of Lisinopril 2.'5mg'$  because she was passing out on the medication and switched her medication to  losartan 12.'5mg'$ .    I did schedule her an appt with Laurann Montana at the [facility] office for tomorrow.

## 2022-06-10 ENCOUNTER — Other Ambulatory Visit (HOSPITAL_BASED_OUTPATIENT_CLINIC_OR_DEPARTMENT_OTHER): Payer: Self-pay

## 2022-06-10 ENCOUNTER — Encounter (HOSPITAL_BASED_OUTPATIENT_CLINIC_OR_DEPARTMENT_OTHER): Payer: Self-pay | Admitting: Family

## 2022-06-10 ENCOUNTER — Ambulatory Visit (INDEPENDENT_AMBULATORY_CARE_PROVIDER_SITE_OTHER): Payer: Medicare Other | Admitting: Family

## 2022-06-10 ENCOUNTER — Encounter: Payer: Self-pay | Admitting: Family Medicine

## 2022-06-10 ENCOUNTER — Other Ambulatory Visit (INDEPENDENT_AMBULATORY_CARE_PROVIDER_SITE_OTHER): Payer: Medicare Other

## 2022-06-10 VITALS — BP 98/70 | HR 85 | Ht 64.0 in | Wt 154.0 lb

## 2022-06-10 DIAGNOSIS — G20A1 Parkinson's disease without dyskinesia, without mention of fluctuations: Secondary | ICD-10-CM | POA: Diagnosis not present

## 2022-06-10 DIAGNOSIS — E782 Mixed hyperlipidemia: Secondary | ICD-10-CM

## 2022-06-10 DIAGNOSIS — I447 Left bundle-branch block, unspecified: Secondary | ICD-10-CM

## 2022-06-10 DIAGNOSIS — I10 Essential (primary) hypertension: Secondary | ICD-10-CM

## 2022-06-10 DIAGNOSIS — R55 Syncope and collapse: Secondary | ICD-10-CM

## 2022-06-10 MED ORDER — MIDODRINE HCL 2.5 MG PO TABS
2.5000 mg | ORAL_TABLET | Freq: Three times a day (TID) | ORAL | 11 refills | Status: DC | PRN
  Filled 2022-06-10: qty 90, 30d supply, fill #0

## 2022-06-10 NOTE — Patient Instructions (Addendum)
Medication Instructions:  Your physician has recommended you make the following change in your medication:    Remain OFF Lisinopril and Losartan  START MIdodrine 2.'5mg'$  as needed for systolic blood pressure less than 100  *If you need a refill on your cardiac medications before your next appointment, please call your pharmacy*  Lab Work/Testing/Procedures:  Your physician has recommended that you wear a Zio monitor.   This monitor is a medical device that records the heart's electrical activity. Doctors most often use these monitors to diagnose arrhythmias. Arrhythmias are problems with the speed or rhythm of the heartbeat. The monitor is a small device applied to your chest. You can wear one while you do your normal daily activities. While wearing this monitor if you have any symptoms to push the button and record what you felt. Once you have worn this monitor for the period of time provider prescribed (Usually 14 days), you will return the monitor device in the postage paid box. Once it is returned they will download the data collected and provide [place] with a report which the provider will then review and we will call you with those results. Important tips:  Avoid showering during the first 24 hours of wearing the monitor. Avoid excessive sweating to help maximize wear time. Do not submerge the device, no hot tubs, and no swimming pools. Keep any lotions or oils away from the patch. After 24 hours you may shower with the patch on. Take brief showers with your back facing the shower head.  Do not remove patch once it has been placed because that will interrupt data and decrease adhesive wear time. Push the button when you have any symptoms and write down what you were feeling. Once you have completed wearing your monitor, remove and place into box which has postage paid and place in your outgoing mailbox.  If for some reason you have misplaced your box then call our office and we can provide  another box and/or mail it off for you.  Follow-Up: At [facility], you and your health needs are our priority.  As part of our continuing mission to provide you with exceptional heart care, we have created designated Provider Care Teams.  These Care Teams include your primary Cardiologist (physician) and Advanced Practice Providers (APPs -  Physician Assistants and Nurse Practitioners) who all work together to provide you with the care you need, when you need it.  We recommend signing up for the patient portal called "MyChart".  Sign up information is provided on this After Visit Summary.  MyChart is used to connect with patients for Virtual Visits (Telemedicine).  Patients are able to view lab/test results, encounter notes, upcoming appointments, etc.  Non-urgent messages can be sent to your provider as well.   To learn more about what you can do with MyChart, go to [email or web address].    Your next appointment:   As scheduled   Other Instructions  Orthostatic Hypotension Blood pressure is a measurement of how strongly, or weakly, your circulating blood is pressing against the walls of your arteries. Orthostatic hypotension is a drop in blood pressure that can happen when you change positions, such as when you go from lying down to standing. Arteries are blood vessels that carry blood from your heart throughout your body. When blood pressure is too low, you may not get enough blood to your brain or to the rest of your organs. Orthostatic hypotension can cause light-headedness, sweating, rapid heartbeat, blurred vision, and fainting.  These symptoms require further investigation into the cause. What are the causes? Orthostatic hypotension can be caused by many things, including: Sudden changes in posture, such as standing up quickly after you have been sitting or lying down. Loss of blood (anemia) or loss of body fluids (dehydration). Heart problems, neurologic problems, or hormone  problems. Pregnancy. Aging. The risk for this condition increases as you get older. Severe infection (sepsis). Certain medicines, such as medicines for high blood pressure or medicines that make the body lose excess fluids (diuretics). What are the signs or symptoms? Symptoms of this condition may include: Weakness, light-headedness, or dizziness. Sweating. Blurred vision. Tiredness (fatigue). Rapid heartbeat. Fainting, in severe cases. How is this diagnosed? This condition is diagnosed based on: Your symptoms and medical history. Your blood pressure measurements. Your health care provider will check your blood pressure when you are: Lying down. Sitting. Standing. A blood pressure reading is recorded as two numbers, such as "120 over 80" (or 120/80). The first ("top") number is called the systolic pressure. It is a measure of the pressure in your arteries as your heart beats. The second ("bottom") number is called the diastolic pressure. It is a measure of the pressure in your arteries when your heart relaxes between beats. Blood pressure is measured in a unit called mmHg. Healthy blood pressure for most adults is 120/80 mmHg. Orthostatic hypotension is defined as a 20 mmHg drop in systolic pressure or a 10 mmHg drop in diastolic pressure within 3 minutes of standing. Other information or tests that may be used to diagnose orthostatic hypotension include: Your other vital signs, such as your heart rate and temperature. Blood tests. An electrocardiogram (ECG) or echocardiogram. A Holter monitor. This is a device you wear that records your heart rhythm continuously, usually for 24-48 hours. Tilt table test. For this test, you will be safely secured to a table that moves you from a lying position to an upright position. Your heart rhythm and blood pressure will be monitored during the test. How is this treated? This condition may be treated by: Changing your diet. This may involve eating  more salt (sodium) or drinking more water. Changing the dosage of certain medicines you are taking that might be lowering your blood pressure. Correcting the underlying reason for the orthostatic hypotension. Wearing compression stockings. Taking medicines to raise your blood pressure. Avoiding actions that trigger symptoms. Follow these instructions at home: Medicines Take over-the-counter and prescription medicines only as told by your health care provider. Follow instructions from your health care provider about changing the dosage of your current medicines, if this applies. Do not stop or adjust any of your medicines on your own. Eating and drinking  Drink enough fluid to keep your urine pale yellow. Eat extra salt only as directed. Do not add extra salt to your diet unless advised by your health care provider. Eat frequent, small meals. Avoid standing up suddenly after eating. General instructions  Get up slowly from lying down or sitting positions. This gives your blood pressure a chance to adjust. Avoid hot showers and excessive heat as directed by your health care provider. Engage in regular physical activity as directed by your health care provider. If you have compression stockings, wear them as told. Keep all follow-up visits. This is important. Contact a health care provider if: You have a fever for more than 2-3 days. You feel more thirsty than usual. You feel dizzy or weak. Get help right away if: You have chest pain.  You have a fast or irregular heartbeat. You become sweaty or feel light-headed. You feel short of breath. You faint. You have any symptoms of a stroke. "BE FAST" is an easy way to remember the main warning signs of a stroke: B - Balance. Signs are dizziness, sudden trouble walking, or loss of balance. E - Eyes. Signs are trouble seeing or a sudden change in vision. F - Face. Signs are sudden weakness or numbness of the face, or the face or eyelid  drooping on one side. A - Arms. Signs are weakness or numbness in an arm. This happens suddenly and usually on one side of the body. S - Speech. Signs are sudden trouble speaking, slurred speech, or trouble understanding what people say. T - Time. Time to call emergency services. Write down what time symptoms started. You have other signs of a stroke, such as: A sudden, severe headache with no known cause. Nausea or vomiting. Seizure. These symptoms may represent a serious problem that is an emergency. Do not wait to see if the symptoms will go away. Get medical help right away. Call your local emergency services (911 in the [place] Do not drive yourself to the hospital. Summary Orthostatic hypotension is a sudden drop in blood pressure. It can cause light-headedness, sweating, rapid heartbeat, blurred vision, and fainting. Orthostatic hypotension can be diagnosed by having your blood pressure taken while lying down, sitting, and then standing. Treatment may involve changing your diet, wearing compression stockings, sitting up slowly, adjusting your medicines, or correcting the underlying reason for the orthostatic hypotension. Get help right away if you have chest pain, a fast or irregular heartbeat, or symptoms of a stroke. This information is not intended to replace advice given to you by your health care provider. Make sure you discuss any questions you have with your health care provider. Document Revised: 10/29/2020 Document Reviewed: 10/29/2020 Elsevier Patient Education  [place]

## 2022-06-10 NOTE — Progress Notes (Unsigned)
Office Visit    Patient Name: Morgan [name] Date of Encounter: 06/11/2022  PCP:  [name], [place]  Cardiologist:  Sanda Klein, MD Advanced Practice Provider:  No care team member to display Electrophysiologist:  None      Chief Complaint    Morgan [name] is a 71 y.o. female with a hx of Parkinson disease, left bundle branch block, hypertension, hyperlipidemia, mitral regurgitation  presents today for hypotension.  Past Medical History    Past Medical History:  Diagnosis Date   Anxiety    Fibromyalgia    Headache    Hypercholesteremia    controlled on medication   Hyperlipidemia    Hypertension    no current meds   LBBB (left bundle branch block)    Melanoma (HCC)    MVP (mitral valve prolapse)    Osteopenia    Parkinson disease    Pneumonia    SVD (spontaneous vaginal delivery)    x 2   Past Surgical History:  Procedure Laterality Date   BLEPHAROPLASTY     cold knife conization     COLONOSCOPY     FH colon CA   CYSTOSCOPY W/ URETERAL STENT PLACEMENT Left 10/18/2021   Procedure: CYSTOSCOPY WITH RETROGRADE PYELOGRAM, URETERAL STENT PLACEMENT;  Surgeon: Festus Aloe, MD;  Location: [facility];  Service: Urology;  Laterality: Left;   DILATATION & CURETTAGE/HYSTEROSCOPY WITH MYOSURE N/A 02/14/2018   Procedure: ATTEMPTED DILATATION & CURETTAGE/HYSTEROSCOPY WITH MYOSURE;  Surgeon: Christophe Louis, MD;  Location: [facility];  Service: Gynecology;  Laterality: N/A;  polyp   melanoma cancer     removed from left leg   RECONSTRUCTION OF EYELID     TONSILLECTOMY     TUBAL LIGATION     URETEROSCOPY WITH HOLMIUM LASER LITHOTRIPSY Left 12/17/2021   Procedure: URETEROSCOPY/HOLMIUM LASER/STENT EXCHANGE;  Surgeon: Festus Aloe, MD;  Location: [facility];  Service: Urology;  Laterality: Left;   WISDOM TOOTH EXTRACTION      Allergies  Allergies  Allergen Reactions   Other Anaphylaxis    "Anti Seizure Medications"   Phenergan  [Promethazine Hcl] Other (See Comments)    Makes Parkinson's worse   Acetaminophen Other (See Comments)    "I just get weird"   Itraconazole Hives   Tamsulosin Other (See Comments)    B/P DROPPED TOO MUCH!!    History of Present Illness    Morgan [name] is a 71 y.o. female with a hx of Parkinson disease, left bundle branch block, hypertension, hyperlipidemia, mitral regurgitation last seen 11/17/21.  When last seen 07/30/2019 she noted occasional difficulties with orthostatic hypotension in the setting of Parkinson's but this was infrequent and she was using a Ellyanna Holton to avoid falls.  Her work-up revealed no evidence of structural heart disease.  She was recommended for follow-up in 1 year for monitoring of left bundle branch block.  Admission 09/2021 with severe sepsis due to E. coli UTI bacteremia.  She underwent emergent successful left ureteral stent placement 10/18/2021.  She was treated with Rocephin.  Echocardiogram during admission revealed normal LVEF 60 to 65%, no RWMA, indeterminate diastolic parameters, elevated LVEDP, RV normal size and function, mild to moderate MR.  She was recommended for home health PT/OT.  Seen 11/17/21 for clearance for cystoscopic volume laser lithotripsy stent placement doing well from a cardiac perspective. No recurrent hypotension since discontinuation of Flomax.  Admitted 9/18 - 05/18/2022 for [place]. Discharged with [facility] PT/OT.  She saw primary care  provider 06/02/2022 noting labile blood pressure with episodes of passing on lisinopril 5 mg but not well-controlled on lisinopril 2.5 mg.  Her primary care provider changed her losartan 12.5 mg daily.  Subsequent blood pressure reading sent via phone with hypotension and syncope, BP as low as 97/77, she was recommended to stop Losartan.  She presents today for follow up with her husband. Tells me she has episodes of getting "rigid" and lightheaded. Unclear if she truly lost syncope. Notes if her husband does  not get behind her will fall to the floor.   Her husband notes somewhat poor PO intake. She just stopped Losartan this morning so unable to tell if any significant change. Discussed role Parkisons would play in hypotension. No chest pain, dyspnea, edema, palpitations.    EKGs/Labs/Other Studies Reviewed:   The following studies were reviewed today:  Echo 09/2021  1. Left ventricular ejection fraction, by estimation, is 60 to 65%. The  left ventricle has normal function. The left ventricle has no regional  wall motion abnormalities. Left ventricular diastolic parameters are  indeterminate. Elevated left ventricular  end-diastolic pressure.   2. Right ventricular systolic function is normal. The right ventricular  size is normal.   3. The mitral valve is grossly normal. Mild to moderate mitral valve  regurgitation.   4. The aortic valve is normal in structure. Aortic valve regurgitation is  not visualized. No aortic stenosis is present.   EKG:  No EKG today.  Recent Labs: 10/18/2021: TSH 1.400 05/17/2022: Magnesium 2.0 05/18/2022: ALT 10; BUN 11; Creatinine, Ser 0.64; Hemoglobin 11.5; Platelets 197; Potassium 3.4; Sodium 141  Recent Lipid Panel    Component Value Date/Time   CHOL  09/04/2008 0105    155        ATP III CLASSIFICATION:  <200     mg/dL   Desirable  200-239  mg/dL   Borderline High  >=240    mg/dL   High          TRIG 175 (H) 09/04/2008 0105   HDL 40 09/04/2008 0105   CHOLHDL 3.9 09/04/2008 0105   VLDL 35 09/04/2008 0105   LDLCALC  09/04/2008 0105    80        Total Cholesterol/HDL:CHD Risk Coronary Heart Disease Risk Table                     Men   Women  1/2 Average Risk   3.4   3.3  Average Risk       5.0   4.4  2 X Average Risk   9.6   7.1  3 X Average Risk  23.4   11.0        Use the calculated Patient Ratio above and the CHD Risk Table to determine the patient's CHD Risk.        ATP III CLASSIFICATION (LDL):  <100     mg/dL   Optimal  100-129   mg/dL   Near or Above                    Optimal  130-159  mg/dL   Borderline  160-189  mg/dL   High  >190     mg/dL   Very High    Home Medications   Current Meds  Medication Sig   ALPRAZolam (XANAX) 0.25 MG tablet Take 0.25 mg by mouth daily as needed for anxiety.    aspirin EC 81 MG tablet Take 81 mg by  mouth daily. Swallow whole.   atorvastatin (LIPITOR) 10 MG tablet Take 10 mg by mouth in the morning.   BLINK TEARS 0.25 % SOLN Place 1 drop into both eyes 3 (three) times daily as needed (for dryness).   carbidopa-levodopa (SINEMET IR) 25-100 MG tablet Take 1 tablet by mouth See admin instructions. Take 1 tablet by mouth at 6 AM, 8 AM, 10 AM, 12 NOON, 2 PM, 4 PM, 6 PM, and an additional 1 tablet at bedtime as needed for symptoms   Cholecalciferol (VITAMIN D3) 25 MCG (1000 UT) CHEW Chew 2,500 Units by mouth daily.   Coenzyme Q10-Vitamin E (QUNOL ULTRA COQ10 PO) Take 1 capsule by mouth daily.   CYMBALTA 60 MG capsule TAKE 1 CAPSULE DAILY (Patient taking differently: Take 60 mg by mouth in the morning.)   gabapentin (NEURONTIN) 400 MG capsule Take 1 capsule (400 mg total) by mouth 3 (three) times daily as needed (for leg pain or soreness).   Ibuprofen 200 MG CAPS Take 2 capsules (400 mg total) by mouth every 8 (eight) hours as needed (Leg pain, headache or mild pain).   methocarbamol (ROBAXIN) 750 MG tablet Take 750 mg by mouth every 6 (six) hours as needed for muscle spasms (or leg pain).   midodrine (PROAMATINE) 2.5 MG tablet Take 1 tablet (2.5 mg total) by mouth 3 (three) times daily as needed (for systolic blood pressure less than 100).   Multiple Vitamins-Minerals (PRESERVISION AREDS 2 PO) Take 1 capsule by mouth 2 (two) times daily.   raloxifene (EVISTA) 60 MG tablet Take 60 mg by mouth in the morning.   ramelteon (ROZEREM) 8 MG tablet Take 1 tablet (8 mg total) by mouth at bedtime as needed for sleep.   TART CHERRY PO Take 3,000 mg by mouth daily.     Review of Systems      All  other systems reviewed and are otherwise negative except as noted above.  Physical Exam    VS:  BP 98/70   Pulse 85   Ht '5\' 4"'$  (1.626 m)   Wt 154 lb (69.9 kg)   BMI 26.43 kg/m  , BMI Body mass index is 26.43 kg/m.  Wt Readings from Last 3 Encounters:  06/10/22 154 lb (69.9 kg)  06/01/22 154 lb (69.9 kg)  05/17/22 160 lb (72.6 kg)    GEN: Well nourished, well developed, in no acute distress. HEENT: normal. Neck: Supple, no JVD, carotid bruits, or masses. Cardiac: RRR, no murmurs, rubs, or gallops. No clubbing, cyanosis, edema.  Radials/PT 2+ and equal bilaterally.  Respiratory:  Respirations regular and unlabored, clear to auscultation bilaterally. GI: Soft, nontender, nondistended. MS: No deformity or atrophy. Skin: Warm and dry, no rash. Neuro:  Strength and sensation are intact. Psych: Normal affect.  Assessment & Plan    Hypotension -Previous diagnosis of HTN now with hypotension likely due to Parkinsonism. Remain off Losartan. Provide Midodrine 2.'5mg'$  PRN for SBP <100. If needing consistently, could increase to TID dosing. Encouraged PO intake, slow position changes, compression stockings. Reasonable BP goal of <140/90 to prevent hypotension. Could consider 24h BP monitor in future, defer today due to upcoming travel plans.   Hyperlipidemia -continue atorvastatin 10 mg daily.  Denies myalgias.  LBBB / ? Syncope -Known finding by EKG.   Avoid AV nodal blocking therapy. Due to recent episdoes of lightheadedness, possible syncope 7 day ZIO to rule out further heart block.  Disposition: Follow up as scheduled with Sanda Klein, MD or APP.  Signed, Urban Gibson  Thomes Dinning, NP 06/11/2022, 10:09 AM [place]

## 2022-06-14 NOTE — Telephone Encounter (Signed)
Spoke with Jori Moll. PT appointment has been setup.

## 2022-06-15 ENCOUNTER — Ambulatory Visit (HOSPITAL_BASED_OUTPATIENT_CLINIC_OR_DEPARTMENT_OTHER): Payer: Medicare Other | Attending: Family Medicine | Admitting: Physical Therapy

## 2022-06-15 DIAGNOSIS — M79604 Pain in right leg: Secondary | ICD-10-CM | POA: Diagnosis not present

## 2022-06-15 DIAGNOSIS — M79605 Pain in left leg: Secondary | ICD-10-CM | POA: Diagnosis not present

## 2022-06-15 DIAGNOSIS — G20A1 Parkinson's disease without dyskinesia, without mention of fluctuations: Secondary | ICD-10-CM | POA: Diagnosis not present

## 2022-06-15 DIAGNOSIS — R269 Unspecified abnormalities of gait and mobility: Secondary | ICD-10-CM | POA: Diagnosis not present

## 2022-06-15 DIAGNOSIS — R2689 Other abnormalities of gait and mobility: Secondary | ICD-10-CM | POA: Diagnosis not present

## 2022-06-15 DIAGNOSIS — R296 Repeated falls: Secondary | ICD-10-CM | POA: Diagnosis not present

## 2022-06-15 NOTE — Therapy (Signed)
OUTPATIENT PHYSICAL THERAPY LOWER EXTREMITY EVALUATION   Patient Name: Morgan [name] MRN: [id number] DOB:Feb 18, 1951, 71 y.o., female Today's Date: 06/15/2022   PT End of Session - 06/15/22 1301     Visit Number 1    Number of Visits 16    Date for PT Re-Evaluation 08/10/22    PT Start Time 1245    PT Stop Time 1058    PT Time Calculation (min) 43 min    Activity Tolerance Patient tolerated treatment well    Behavior During Therapy WFL for tasks assessed/performed             Past Medical History:  Diagnosis Date   Anxiety    Fibromyalgia    Headache    Hypercholesteremia    controlled on medication   Hyperlipidemia    Hypertension    no current meds   LBBB (left bundle branch block)    Melanoma (HCC)    MVP (mitral valve prolapse)    Osteopenia    Parkinson disease    Pneumonia    SVD (spontaneous vaginal delivery)    x 2   Past Surgical History:  Procedure Laterality Date   BLEPHAROPLASTY     cold knife conization     COLONOSCOPY     FH colon CA   CYSTOSCOPY W/ URETERAL STENT PLACEMENT Left 10/18/2021   Procedure: CYSTOSCOPY WITH RETROGRADE PYELOGRAM, URETERAL STENT PLACEMENT;  Surgeon: Festus Aloe, MD;  Location: [facility];  Service: Urology;  Laterality: Left;   DILATATION & CURETTAGE/HYSTEROSCOPY WITH MYOSURE N/A 02/14/2018   Procedure: ATTEMPTED DILATATION & CURETTAGE/HYSTEROSCOPY WITH MYOSURE;  Surgeon: Christophe Louis, MD;  Location: [facility];  Service: Gynecology;  Laterality: N/A;  polyp   melanoma cancer     removed from left leg   RECONSTRUCTION OF EYELID     TONSILLECTOMY     TUBAL LIGATION     URETEROSCOPY WITH HOLMIUM LASER LITHOTRIPSY Left 12/17/2021   Procedure: URETEROSCOPY/HOLMIUM LASER/STENT EXCHANGE;  Surgeon: Festus Aloe, MD;  Location: [facility];  Service: Urology;  Laterality: Left;   WISDOM TOOTH EXTRACTION     Patient Active Problem List   Diagnosis Date Noted   COVID-19 virus infection 05/16/2022   HTN (hypertension)  10/20/2021   Physical deconditioning 10/20/2021   Hydronephrosis with infection 10/18/2021   Bacteremia due to Escherichia coli 10/18/2021   Severe sepsis ([place]) 10/17/2021   UTI (urinary tract infection) 80/99/8338   Acute metabolic encephalopathy 25/12/3974   Parkinson disease 10/17/2021   Cervical stenosis (uterine cervix) 02/14/2018   Postmenopausal bleeding 02/14/2018   LBBB (left bundle branch block) 12/25/2015   PVCs (premature ventricular contractions) 12/25/2015   Hyperlipidemia 12/25/2015   Fibromyalgia 01/16/2014   Cholelithiasis 04/25/2013   Paralysis agitans 01/15/2013   Unspecified hereditary and idiopathic peripheral neuropathy 01/15/2013   [name] MD PCP:   REFERRING PROVIDER: Hulan Saas MD   REFERRING DIAG: Parkinsons   THERAPY DIAG:  Other abnormalities of gait and mobility  Parkinson's disease without dyskinesia or fluctuating manifestations  Rationale for Evaluation and Treatment Rehabilitation  ONSET DATE: Several years   SUBJECTIVE:   SUBJECTIVE STATEMENT: The patient has had Parkisnons for many years. She has had land therapy with varying degrees of success. She has recently had an onset of bilateral leg pain that comes and goes. The pain is mostly in the front but can go down to the back as well. She is a member of the gym. She has frequent falls.   PERTINENT HISTORY: Fibromyalgia; headaches, LBBB; osteopenia  PAIN:  Are you having pain? Yes: NPRS scale: 5/10 right now; at worst 10/10 at worst  Pain location: down the front of her legs and and go to the back of calf's  Pain description: sharp ache  Aggravating factors: just comes and goes  Relieving factors: just comes and goes; sometimes ibuprofin works but she is limited as to how much she can take  PRECAUTIONS: Patient reports that because of her heart she will pass out at times.   WEIGHT BEARING RESTRICTIONS No  FALLS:  Has patient fallen in last 6 months? Yes. Number of falls  3-4 most recently slipped on something in the closet and fell face first    LIVING ENVIRONMENT: Stairs to the second floor in her house but she dosen't do much  4 steps in the front  2 steps in the garage  OCCUPATION: retired   Office manager: reading; goes to an exercise class   PLOF: Independent with household mobility with device  PATIENT GOALS  To have less pain in her legs and to move better    OBJECTIVE:   DIAGNOSTIC FINDINGS:   PATIENT SURVEYS:  FOTO    COGNITION:  Overall cognitive status: Within functional limits for tasks assessed     SENSATION: Denies paresthesias   POSTURE: No Significant postural limitations  PALPATION: No trigger points noted   LOWER EXTREMITY ROM:  Passive ROM Right eval Left eval  Hip flexion    Hip extension    Hip abduction    Hip adduction    Hip internal rotation    Hip external rotation    Knee flexion    Knee extension    Ankle dorsiflexion    Ankle plantarflexion    Ankle inversion    Ankle eversion     (Blank rows = not tested)  WNL  LOWER EXTREMITY MMT:  MMT Right eval Left eval  Hip flexion 21.7 29.7  Hip extension    Hip abduction 27.6 18.5  Hip adduction    Hip internal rotation    Hip external rotation    Knee flexion    Knee extension 23.5 20.2  Ankle dorsiflexion    Ankle plantarflexion    Ankle inversion    Ankle eversion     (Blank rows = not tested)  GAIT: Decreased hip flexion; leans to the left; right hip frop. Patient required CGA while walking and min a when she turned for balance.   Functional tests:  Sit to stand: uses's hands and needs several trials to stand from the table. Poor intial standing balance    Balance:   Narrow base min a  Narrow base eyes closed min to mod a  Tandem stance Min a both ways.    TODAY'S TREATMENT: Has full home program from previous treatment    PATIENT EDUCATION:  Education details: reviewed HEP; symptom management; safety with gait.   Person  educated: Patient Education method: Explanation, Demonstration, Tactile cues, Verbal cues, and Handouts Education comprehension: verbalized understanding, returned demonstration, verbal cues required, tactile cues required, and needs further education   HOME EXERCISE PROGRAM:  has a program already   ASSESSMENT:  CLINICAL IMPRESSION: Patient is a 71 y.o. female who was seen today for physical therapy evaluation and treatment for Parkisnons along with a new onset of leg pain. She does not have trigger points in her low back or lateral legs at this time. She has minor deficits on strength. She is a fall risk at this time. We did not  have time for a full BERG but she did require assist with all of her other balance testing. She also required min a when turning and required several trials with sit to stand transfer.    OBJECTIVE IMPAIRMENTS Abnormal gait, decreased activity tolerance, decreased balance, decreased mobility, difficulty walking, decreased strength, impaired tone, and pain.   ACTIVITY LIMITATIONS carrying, lifting, bending, standing, squatting, stairs, transfers, bed mobility, dressing, and locomotion level  PARTICIPATION LIMITATIONS: meal prep, cleaning, laundry, driving, shopping, community activity, and yard work  PERSONAL FACTORS Fibromyalgia; headaches, LBBB; osteopenia  are also affecting patient's functional outcome.   REHAB POTENTIAL: Good  CLINICAL DECISION MAKING: Evolving/moderate complexity increasing pain in her quads   EVALUATION COMPLEXITY: Moderate   GOALS: Goals reviewed with patient? Yes  SHORT TERM GOALS: Target date: 07/13/2022  Patient will transfer sit to stand smoothly and independently  Baseline: Goal status: INITIAL  2.  Patient will increase gross strength by 5lbs  Baseline:  Goal status: INITIAL  3.  Patient will be independent with base pool program  Baseline:  Goal status: INITIAL LONG TERM GOALS: Target date: 08/10/2022   Patient  will go improve balance to CGA for all balance exercises to improve safety  Baseline:  Goal status: INITIAL  2.  Patient will reach FOTO goal to demonstrate improved function  Baseline:  Goal status: INITIAL  3.  Patient will have a full balance program  Baseline:  Goal status: INITIAL   PLAN: PT FREQUENCY: 2x/week  PT DURATION: 8 weeks  PLANNED INTERVENTIONS: Therapeutic exercises, Therapeutic activity, Neuromuscular re-education, Balance training, Gait training, Patient/Family education, Self Care, Joint mobilization, Stair training, Aquatic Therapy, Cryotherapy, Moist heat, Taping, Manual therapy, and Re-evaluation  PLAN FOR NEXT SESSION: begin with gait and balance training in the pool. Be aware of bradycardia    Carney Living, PT 06/15/2022, 1:06 PM

## 2022-06-16 ENCOUNTER — Encounter (HOSPITAL_BASED_OUTPATIENT_CLINIC_OR_DEPARTMENT_OTHER): Payer: Self-pay | Admitting: Physical Therapy

## 2022-06-17 DIAGNOSIS — G20 Parkinson's disease: Secondary | ICD-10-CM | POA: Diagnosis not present

## 2022-06-17 DIAGNOSIS — I10 Essential (primary) hypertension: Secondary | ICD-10-CM | POA: Diagnosis not present

## 2022-06-17 DIAGNOSIS — M797 Fibromyalgia: Secondary | ICD-10-CM | POA: Diagnosis not present

## 2022-06-17 DIAGNOSIS — U071 COVID-19: Secondary | ICD-10-CM | POA: Diagnosis not present

## 2022-06-17 DIAGNOSIS — M1990 Unspecified osteoarthritis, unspecified site: Secondary | ICD-10-CM | POA: Diagnosis not present

## 2022-06-17 DIAGNOSIS — D649 Anemia, unspecified: Secondary | ICD-10-CM | POA: Diagnosis not present

## 2022-06-20 ENCOUNTER — Ambulatory Visit (HOSPITAL_BASED_OUTPATIENT_CLINIC_OR_DEPARTMENT_OTHER): Payer: Medicare Other | Admitting: Physical Therapy

## 2022-06-20 ENCOUNTER — Encounter (HOSPITAL_BASED_OUTPATIENT_CLINIC_OR_DEPARTMENT_OTHER): Payer: Self-pay | Admitting: Physical Therapy

## 2022-06-20 DIAGNOSIS — M79605 Pain in left leg: Secondary | ICD-10-CM | POA: Diagnosis not present

## 2022-06-20 DIAGNOSIS — G20A1 Parkinson's disease without dyskinesia, without mention of fluctuations: Secondary | ICD-10-CM

## 2022-06-20 DIAGNOSIS — M79604 Pain in right leg: Secondary | ICD-10-CM | POA: Diagnosis not present

## 2022-06-20 DIAGNOSIS — R2689 Other abnormalities of gait and mobility: Secondary | ICD-10-CM | POA: Diagnosis not present

## 2022-06-20 DIAGNOSIS — R269 Unspecified abnormalities of gait and mobility: Secondary | ICD-10-CM | POA: Diagnosis not present

## 2022-06-20 DIAGNOSIS — R296 Repeated falls: Secondary | ICD-10-CM | POA: Diagnosis not present

## 2022-06-20 NOTE — Therapy (Signed)
OUTPATIENT PHYSICAL THERAPY LOWER EXTREMITY EVALUATION   Patient Name: Morgan [name] MRN: [id number] DOB:Aug 15, 1951, 71 y.o., female Today's Date: 06/20/2022   PT End of Session - 06/20/22 1331     Visit Number 2    Number of Visits 16    Date for PT Re-Evaluation 08/10/22    PT Start Time 1121    PT Stop Time 1200    PT Time Calculation (min) 39 min    Activity Tolerance Patient tolerated treatment well    Behavior During Therapy WFL for tasks assessed/performed              Past Medical History:  Diagnosis Date   Anxiety    Fibromyalgia    Headache    Hypercholesteremia    controlled on medication   Hyperlipidemia    Hypertension    no current meds   LBBB (left bundle branch block)    Melanoma (HCC)    MVP (mitral valve prolapse)    Osteopenia    Parkinson disease    Pneumonia    SVD (spontaneous vaginal delivery)    x 2   Past Surgical History:  Procedure Laterality Date   BLEPHAROPLASTY     cold knife conization     COLONOSCOPY     FH colon CA   CYSTOSCOPY W/ URETERAL STENT PLACEMENT Left 10/18/2021   Procedure: CYSTOSCOPY WITH RETROGRADE PYELOGRAM, URETERAL STENT PLACEMENT;  Surgeon: Festus Aloe, MD;  Location: [facility];  Service: Urology;  Laterality: Left;   DILATATION & CURETTAGE/HYSTEROSCOPY WITH MYOSURE N/A 02/14/2018   Procedure: ATTEMPTED DILATATION & CURETTAGE/HYSTEROSCOPY WITH MYOSURE;  Surgeon: Christophe Louis, MD;  Location: [facility];  Service: Gynecology;  Laterality: N/A;  polyp   melanoma cancer     removed from left leg   RECONSTRUCTION OF EYELID     TONSILLECTOMY     TUBAL LIGATION     URETEROSCOPY WITH HOLMIUM LASER LITHOTRIPSY Left 12/17/2021   Procedure: URETEROSCOPY/HOLMIUM LASER/STENT EXCHANGE;  Surgeon: Festus Aloe, MD;  Location: [facility];  Service: Urology;  Laterality: Left;   WISDOM TOOTH EXTRACTION     Patient Active Problem List   Diagnosis Date Noted   COVID-19 virus infection 05/16/2022   HTN (hypertension)  10/20/2021   Physical deconditioning 10/20/2021   Hydronephrosis with infection 10/18/2021   Bacteremia due to Escherichia coli 10/18/2021   Severe sepsis ([place]) 10/17/2021   UTI (urinary tract infection) 17/51/0258   Acute metabolic encephalopathy 52/77/8242   Parkinson disease 10/17/2021   Cervical stenosis (uterine cervix) 02/14/2018   Postmenopausal bleeding 02/14/2018   LBBB (left bundle branch block) 12/25/2015   PVCs (premature ventricular contractions) 12/25/2015   Hyperlipidemia 12/25/2015   Fibromyalgia 01/16/2014   Cholelithiasis 04/25/2013   Paralysis agitans 01/15/2013   Unspecified hereditary and idiopathic peripheral neuropathy 01/15/2013   [name] MD PCP:   REFERRING PROVIDER: Hulan Saas MD   REFERRING DIAG: Parkinsons   THERAPY DIAG:  Other abnormalities of gait and mobility  Parkinson's disease without dyskinesia or fluctuating manifestations  Rationale for Evaluation and Treatment Rehabilitation  ONSET DATE: Several years   SUBJECTIVE:   SUBJECTIVE STATEMENT: No complaints.  Ready to begin aquatics  The patient has had Parkisnons for many years. She has had land therapy with varying degrees of success. She has recently had an onset of bilateral leg pain that comes and goes. The pain is mostly in the front but can go down to the back as well. She is a member of the gym. She has frequent  falls.   PERTINENT HISTORY: Fibromyalgia; headaches, LBBB; osteopenia   PAIN:  Are you having pain? Yes: NPRS scale: 5/10 right now; at worst 10/10 at worst  Pain location: down the front of her legs and and go to the back of calf's  Pain description: sharp ache  Aggravating factors: just comes and goes  Relieving factors: just comes and goes; sometimes ibuprofin works but she is limited as to how much she can take  PRECAUTIONS: Patient reports that because of her heart she will pass out at times.   WEIGHT BEARING RESTRICTIONS No  FALLS:  Has patient  fallen in last 6 months? Yes. Number of falls 3-4 most recently slipped on something in the closet and fell face first    LIVING ENVIRONMENT: Stairs to the second floor in her house but she dosen't do much  4 steps in the front  2 steps in the garage  OCCUPATION: retired   Office manager: reading; goes to an exercise class   PLOF: Independent with household mobility with device  PATIENT GOALS  To have less pain in her legs and to move better    OBJECTIVE:   DIAGNOSTIC FINDINGS:   PATIENT SURVEYS:  FOTO    COGNITION:  Overall cognitive status: Within functional limits for tasks assessed     SENSATION: Denies paresthesias   POSTURE: No Significant postural limitations  PALPATION: No trigger points noted   LOWER EXTREMITY ROM:  Passive ROM Right eval Left eval  Hip flexion    Hip extension    Hip abduction    Hip adduction    Hip internal rotation    Hip external rotation    Knee flexion    Knee extension    Ankle dorsiflexion    Ankle plantarflexion    Ankle inversion    Ankle eversion     (Blank rows = not tested)  WNL  LOWER EXTREMITY MMT:  MMT Right eval Left eval  Hip flexion 21.7 29.7  Hip extension    Hip abduction 27.6 18.5  Hip adduction    Hip internal rotation    Hip external rotation    Knee flexion    Knee extension 23.5 20.2  Ankle dorsiflexion    Ankle plantarflexion    Ankle inversion    Ankle eversion     (Blank rows = not tested)  GAIT: Decreased hip flexion; leans to the left; right hip frop. Patient required CGA while walking and min a when she turned for balance.   Functional tests:  Sit to stand: uses's hands and needs several trials to stand from the table. Poor intial standing balance    Balance:   Narrow base min a  Narrow base eyes closed min to mod a  Tandem stance Min a both ways.    TODAY'S TREATMENT: Pt seen for aquatic therapy today.  Treatment took place in water 3.25-4.5 ft in depth at the [facility]. Temp of water was 91.  Pt entered/exited the pool via steps min assist with hand rail.  Walking ue supported on white barbell forward, back and sidestepping mod having many episodes of freezing LVST: Forward Step; Forward rock and reach; Sideways step; modified Sit to stand. Pt requires mod assist to complete Standing: UE support on wall: df, pf, squats, add/abd and hip extension  Pt requires the buoyancy and hydrostatic pressure of water for support, and to offload joints by unweighting joint load by at least 50 % in navel deep water and by  at least 75-80% in chest to neck deep water.  Viscosity of the water is needed for resistance of strengthening. Water current perturbations provides challenge to standing balance requiring increased core activation.     PATIENT EDUCATION:  Education details: reviewed HEP; symptom management; safety with gait.   Person educated: Patient Education method: Explanation, Demonstration, Tactile cues, Verbal cues, and Handouts Education comprehension: verbalized understanding, returned demonstration, verbal cues required, tactile cues required, and needs further education   HOME EXERCISE PROGRAM:  has a program already   ASSESSMENT:  CLINICAL IMPRESSION: Pt requires therapist assistance in pool at all times for safety (moderate assist). She has multiple LOB which she requires assist to recover. Appears somewhat impulsive although may be primarily driven from Parkinsons. She tolerates initial session fairly well.  She has no complaints. Verbal and tactile cues needed throughout for completion of exercises; execution and slowing movements. +2 assist to exit pool and walk 10 ft to chair. Setting does provide a decreased risk of fall.  Goals onging    OBJECTIVE IMPAIRMENTS Abnormal gait, decreased activity tolerance, decreased balance, decreased mobility, difficulty walking, decreased strength, impaired tone, and pain.   ACTIVITY LIMITATIONS  carrying, lifting, bending, standing, squatting, stairs, transfers, bed mobility, dressing, and locomotion level  PARTICIPATION LIMITATIONS: meal prep, cleaning, laundry, driving, shopping, community activity, and yard work  PERSONAL FACTORS Fibromyalgia; headaches, LBBB; osteopenia  are also affecting patient's functional outcome.   REHAB POTENTIAL: Good  CLINICAL DECISION MAKING: Evolving/moderate complexity increasing pain in her quads   EVALUATION COMPLEXITY: Moderate   GOALS: Goals reviewed with patient? Yes  SHORT TERM GOALS: Target date: 07/18/2022  Patient will transfer sit to stand smoothly and independently  Baseline: Goal status: INITIAL  2.  Patient will increase gross strength by 5lbs  Baseline:  Goal status: INITIAL  3.  Patient will be independent with base pool program  Baseline:  Goal status: INITIAL LONG TERM GOALS: Target date: 08/15/2022   Patient will go improve balance to CGA for all balance exercises to improve safety  Baseline:  Goal status: INITIAL  2.  Patient will reach FOTO goal to demonstrate improved function  Baseline:  Goal status: INITIAL  3.  Patient will have a full balance program  Baseline:  Goal status: INITIAL   PLAN: PT FREQUENCY: 2x/week  PT DURATION: 8 weeks  PLANNED INTERVENTIONS: Therapeutic exercises, Therapeutic activity, Neuromuscular re-education, Balance training, Gait training, Patient/Family education, Self Care, Joint mobilization, Stair training, Aquatic Therapy, Cryotherapy, Moist heat, Taping, Manual therapy, and Re-evaluation  PLAN FOR NEXT SESSION: begin with gait and balance training in the pool. Be aware of bradycardia    Denton Meek, PT MPT 06/20/2022, 1:34 PM

## 2022-06-21 ENCOUNTER — Ambulatory Visit: Payer: Self-pay

## 2022-06-21 NOTE — Patient Outreach (Signed)
  Care Coordination   06/21/2022 Name: Morgan [name] MRN: [id number] DOB: 07/28/1951   Care Coordination Outreach Attempts:  An unsuccessful telephone outreach was attempted for a scheduled appointment today.  Follow Up Plan:  Additional outreach attempts will be made to offer the patient care coordination information and services.   Encounter Outcome:  No Answer  Care Coordination Interventions Activated:  No   Care Coordination Interventions:  No, not indicated    Jone Baseman, RN, MSN [facility] Care Management Care Management Coordinator Direct Line [phone]

## 2022-06-23 ENCOUNTER — Encounter (HOSPITAL_BASED_OUTPATIENT_CLINIC_OR_DEPARTMENT_OTHER): Payer: Self-pay | Admitting: Physical Therapy

## 2022-06-23 ENCOUNTER — Ambulatory Visit (HOSPITAL_BASED_OUTPATIENT_CLINIC_OR_DEPARTMENT_OTHER): Payer: Medicare Other | Admitting: Physical Therapy

## 2022-06-23 ENCOUNTER — Other Ambulatory Visit: Payer: Self-pay | Admitting: Neurology

## 2022-06-23 DIAGNOSIS — M79605 Pain in left leg: Secondary | ICD-10-CM | POA: Diagnosis not present

## 2022-06-23 DIAGNOSIS — R2689 Other abnormalities of gait and mobility: Secondary | ICD-10-CM | POA: Diagnosis not present

## 2022-06-23 DIAGNOSIS — G20A1 Parkinson's disease without dyskinesia, without mention of fluctuations: Secondary | ICD-10-CM

## 2022-06-23 DIAGNOSIS — R296 Repeated falls: Secondary | ICD-10-CM | POA: Diagnosis not present

## 2022-06-23 DIAGNOSIS — M79604 Pain in right leg: Secondary | ICD-10-CM | POA: Diagnosis not present

## 2022-06-23 DIAGNOSIS — R269 Unspecified abnormalities of gait and mobility: Secondary | ICD-10-CM | POA: Diagnosis not present

## 2022-06-23 DIAGNOSIS — G20B1 Parkinson's disease with dyskinesia, without mention of fluctuations: Secondary | ICD-10-CM

## 2022-06-23 NOTE — Therapy (Signed)
OUTPATIENT PHYSICAL THERAPY LOWER EXTREMITY TREATMENT   Patient Name: Morgan [name] MRN: [id number] DOB:1951/06/29, 71 y.o., female Today's Date: 06/23/2022   PT End of Session - 06/23/22 1121     Visit Number 3    Number of Visits 16    Date for PT Re-Evaluation 08/10/22    PT Start Time 1120    PT Stop Time 1200    PT Time Calculation (min) 40 min    Activity Tolerance Patient tolerated treatment well    Behavior During Therapy WFL for tasks assessed/performed              Past Medical History:  Diagnosis Date   Anxiety    Fibromyalgia    Headache    Hypercholesteremia    controlled on medication   Hyperlipidemia    Hypertension    no current meds   LBBB (left bundle branch block)    Melanoma (HCC)    MVP (mitral valve prolapse)    Osteopenia    Parkinson disease    Pneumonia    SVD (spontaneous vaginal delivery)    x 2   Past Surgical History:  Procedure Laterality Date   BLEPHAROPLASTY     cold knife conization     COLONOSCOPY     FH colon CA   CYSTOSCOPY W/ URETERAL STENT PLACEMENT Left 10/18/2021   Procedure: CYSTOSCOPY WITH RETROGRADE PYELOGRAM, URETERAL STENT PLACEMENT;  Surgeon: Festus Aloe, MD;  Location: [facility];  Service: Urology;  Laterality: Left;   DILATATION & CURETTAGE/HYSTEROSCOPY WITH MYOSURE N/A 02/14/2018   Procedure: ATTEMPTED DILATATION & CURETTAGE/HYSTEROSCOPY WITH MYOSURE;  Surgeon: Christophe Louis, MD;  Location: [facility];  Service: Gynecology;  Laterality: N/A;  polyp   melanoma cancer     removed from left leg   RECONSTRUCTION OF EYELID     TONSILLECTOMY     TUBAL LIGATION     URETEROSCOPY WITH HOLMIUM LASER LITHOTRIPSY Left 12/17/2021   Procedure: URETEROSCOPY/HOLMIUM LASER/STENT EXCHANGE;  Surgeon: Festus Aloe, MD;  Location: [facility];  Service: Urology;  Laterality: Left;   WISDOM TOOTH EXTRACTION     Patient Active Problem List   Diagnosis Date Noted   COVID-19 virus infection 05/16/2022   HTN (hypertension)  10/20/2021   Physical deconditioning 10/20/2021   Hydronephrosis with infection 10/18/2021   Bacteremia due to Escherichia coli 10/18/2021   Severe sepsis ([place]) 10/17/2021   UTI (urinary tract infection) 19/37/9024   Acute metabolic encephalopathy 09/73/5329   Parkinson disease 10/17/2021   Cervical stenosis (uterine cervix) 02/14/2018   Postmenopausal bleeding 02/14/2018   LBBB (left bundle branch block) 12/25/2015   PVCs (premature ventricular contractions) 12/25/2015   Hyperlipidemia 12/25/2015   Fibromyalgia 01/16/2014   Cholelithiasis 04/25/2013   Paralysis agitans 01/15/2013   Unspecified hereditary and idiopathic peripheral neuropathy 01/15/2013   [name] MD PCP:   Loistine Chance PROVIDER: Hulan Saas MD   REFERRING DIAG: Parkinsons   THERAPY DIAG:  Other abnormalities of gait and mobility  Parkinson's disease without dyskinesia or fluctuating manifestations  Rationale for Evaluation and Treatment Rehabilitation  ONSET DATE: Several years   SUBJECTIVE:   SUBJECTIVE STATEMENT: Pt reports she is sore today.  She had a fall 2 days ago (shows bruise on Rt hip).    From EVAL: The patient has had Parkisnons for many years. She has had land therapy with varying degrees of success. She has recently had an onset of bilateral leg pain that comes and goes. The pain is mostly in the front but can go  down to the back as well. She is a member of the gym. She has frequent falls.   PERTINENT HISTORY: Fibromyalgia; headaches, LBBB; osteopenia   PAIN:  Are you having pain? Yes: NPRS scale: 4/10 right now Pain location: down the front of her legs and and go to the back of calf's  Pain description: sharp ache  Aggravating factors: just comes and goes  Relieving factors: just comes and goes; sometimes ibuprofin works but she is limited as to how much she can take  PRECAUTIONS: Patient reports that because of her heart she will pass out at times.   WEIGHT BEARING  RESTRICTIONS No  FALLS:  Has patient fallen in last 6 months? Yes. Number of falls 3-4 most recently slipped on something in the closet and fell face first    LIVING ENVIRONMENT: Stairs to the second floor in her house but she dosen't do much  4 steps in the front  2 steps in the garage  OCCUPATION: retired   Office manager: reading; goes to an exercise class   PLOF: Independent with household mobility with device  PATIENT GOALS  To have less pain in her legs and to move better    OBJECTIVE: * findings taken at evaluation, unless otherwise noted.   DIAGNOSTIC FINDINGS:   PATIENT SURVEYS:  FOTO    COGNITION:  Overall cognitive status: Within functional limits for tasks assessed     SENSATION: Denies paresthesias   POSTURE: No Significant postural limitations  PALPATION: No trigger points noted   LOWER EXTREMITY ROM:  Passive ROM Right eval Left eval  Hip flexion    Hip extension    Hip abduction    Hip adduction    Hip internal rotation    Hip external rotation    Knee flexion    Knee extension    Ankle dorsiflexion    Ankle plantarflexion    Ankle inversion    Ankle eversion     (Blank rows = not tested)  WNL  LOWER EXTREMITY MMT:  MMT Right eval Left eval  Hip flexion 21.7 29.7  Hip extension    Hip abduction 27.6 18.5  Hip adduction    Hip internal rotation    Hip external rotation    Knee flexion    Knee extension 23.5 20.2  Ankle dorsiflexion    Ankle plantarflexion    Ankle inversion    Ankle eversion     (Blank rows = not tested)  GAIT: Decreased hip flexion; leans to the left; right hip frop. Patient required CGA while walking and min a when she turned for balance.   Functional tests:  Sit to stand: uses's hands and needs several trials to stand from the table. Poor intial standing balance    Balance:   Narrow base min a  Narrow base eyes closed min to mod a  Tandem stance Min a both ways.    TODAY'S TREATMENT: Pt seen for  aquatic therapy today.  Treatment took place in water 3.25-4.5 ft in depth at the [facility]. Temp of water was 92.  Pt entered/exited the pool via steps SBA with hand rail.  WC transport to/from pool and to/from stairs at pool.   Therapist in water with patient due to decreased balance:  Holding white barbell:  forward / backward gait and side stepping, cues to slow speed of legs for less shuffling Holding white barbell:  LSVT forward step and sideways step x 10 each, with demo and cues for controlled motion  At bench with blue step under feet:  STS holding white barbell x 5 reps CGA/SBA x 2 sets, cues for ant weight shift Holding wall:  heel raises, squats, hip abdct/ add, hip ext x 10 each - pt reported increased LE pain Trial of straddling yellow noodle with LE cycling, min A to maintain upright posture - increased LE pain At wall:  LE hip circles x 10 cw/ccw each LE Return to walking forward and side stepping with white barbell   Pt requires the buoyancy and hydrostatic pressure of water for support, and to offload joints by unweighting joint load by at least 50 % in navel deep water and by at least 75-80% in chest to neck deep water.  Viscosity of the water is needed for resistance of strengthening. Water current perturbations provides challenge to standing balance requiring increased core activation.     PATIENT EDUCATION:  Education details: safety with gait; aquatics progression  Person educated: Patient Education method: Explanation, Demonstration, Tactile cues, Verbal cues, and Handouts Education comprehension: verbalized understanding, returned demonstration, verbal cues required, tactile cues required, and needs further education   HOME EXERCISE PROGRAM:  has a program already; aquatic HEP TBD  ASSESSMENT:  CLINICAL IMPRESSION: Time spent at beginning of session discussing with pt and her husband possible options of including aquatics after discharge and  inquiring whether husband would be joining her in water;  will need to further explore this as pt progresses.   Pt requires therapist assistance in pool at all times for safety. Pt's gait more smooth and steady when focused on quality; if engaged in conversation, gait returns to shuffle.  Pt given mostly SBA and cues to slow the movement for better control.  Minor occasional LOB, easily corrected with CGA to white barbell pt is holding.  Pain elevated after completion of squats and remained elevated to 5/10.  Goals ongoing    OBJECTIVE IMPAIRMENTS Abnormal gait, decreased activity tolerance, decreased balance, decreased mobility, difficulty walking, decreased strength, impaired tone, and pain.   ACTIVITY LIMITATIONS carrying, lifting, bending, standing, squatting, stairs, transfers, bed mobility, dressing, and locomotion level  PARTICIPATION LIMITATIONS: meal prep, cleaning, laundry, driving, shopping, community activity, and yard work  PERSONAL FACTORS Fibromyalgia; headaches, LBBB; osteopenia  are also affecting patient's functional outcome.   REHAB POTENTIAL: Good  CLINICAL DECISION MAKING: Evolving/moderate complexity increasing pain in her quads   EVALUATION COMPLEXITY: Moderate   GOALS: Goals reviewed with patient? Yes  SHORT TERM GOALS: Target date: 07/21/2022  Patient will transfer sit to stand smoothly and independently  Baseline: Goal status: INITIAL  2.  Patient will increase gross strength by 5lbs  Baseline:  Goal status: INITIAL  3.  Patient will be independent with base pool program  Baseline:  Goal status: INITIAL LONG TERM GOALS: Target date: 08/18/2022   Patient will go improve balance to CGA for all balance exercises to improve safety  Baseline:  Goal status: INITIAL  2.  Patient will reach FOTO goal to demonstrate improved function  Baseline:  Goal status: INITIAL  3.  Patient will have a full balance program  Baseline:  Goal status:  INITIAL   PLAN: PT FREQUENCY: 2x/week  PT DURATION: 8 weeks  PLANNED INTERVENTIONS: Therapeutic exercises, Therapeutic activity, Neuromuscular re-education, Balance training, Gait training, Patient/Family education, Self Care, Joint mobilization, Stair training, Aquatic Therapy, Cryotherapy, Moist heat, Taping, Manual therapy, and Re-evaluation  PLAN FOR NEXT SESSION: continue with gait and balance training in the pool. Be aware of bradycardia.  Trial stretches.  Kerin Perna, PTA 06/23/22 1:54 PM [facility] [address] [place], [place], [address] Phone: [phone]   Fax:  [phone]

## 2022-06-24 ENCOUNTER — Ambulatory Visit (INDEPENDENT_AMBULATORY_CARE_PROVIDER_SITE_OTHER): Payer: Medicare Other | Admitting: Neurology

## 2022-06-24 ENCOUNTER — Encounter: Payer: Self-pay | Admitting: Neurology

## 2022-06-24 DIAGNOSIS — G20B2 Parkinson's disease with dyskinesia, with fluctuations: Secondary | ICD-10-CM | POA: Diagnosis not present

## 2022-06-24 DIAGNOSIS — G20A2 Parkinson's disease without dyskinesia, with fluctuations: Secondary | ICD-10-CM | POA: Diagnosis not present

## 2022-06-24 NOTE — Procedures (Signed)
Punch Biopsy Procedure Note  Preprocedure Diagnosis: Bradykinesia; Tremor; dyskinesia  Postprocedure Diagnosis: same  Locations: Site 1: posterior  cervical ;  Site 2: above, left knee;  Site 3: above, left foot  Indications: r/o alpha synucleinopathy  Anesthesia: 3 mL Lidocaine 1% with epinephrine without added sodium bicarbonate  Procedure Details Patient informed of the risks (including but not limited to bleeding, pain, infection, scar and infection) and benefits of the procedure.  Informed consent obtained.  The areas which were chosen for biopsy, as above, and surrounding areas were given a sterile prep using alcohol and betadyne and draped in the usual sterile fashion. The skin was then stretched perpendicular to the skin tension lines and sample removed using the 3 mm punch. Pressure applied, hemostasis achieved.   Dressing applied. The specimen(s) was sent for pathologic examination. The patient tolerated the procedure well.  Estimated Blood Loss: 0 ml  Condition: Stable  Complications: none.  Plan: 1. Instructed to keep the wound dry and covered for 24-48h and clean thereafter. 2. Warning signs of infection were reviewed.

## 2022-06-27 ENCOUNTER — Telehealth: Payer: Self-pay

## 2022-06-27 DIAGNOSIS — I447 Left bundle-branch block, unspecified: Secondary | ICD-10-CM | POA: Diagnosis not present

## 2022-06-27 DIAGNOSIS — R55 Syncope and collapse: Secondary | ICD-10-CM | POA: Diagnosis not present

## 2022-06-27 NOTE — Patient Outreach (Signed)
  Care Coordination   Follow Up Visit Note   06/27/2022 Name: KENIYAH GELINAS MRN: [id number] DOB: 10-Apr-1951  Vivi Barrack Herrero is a 71 y.o. year old female who sees [name], MD for primary care. I  spoke with spouse [name].  What matters to the patients health and wellness today?   Getting in home care set up     Goals Addressed             This [place] with [place] Coordination Interventions: Social Work referral for help with respite care set up   Patient now doing water therapy and Parkinson's therapy at the [facility].  Still wanting to get in home -respite set up through insurance.  Social work to follow up again.        SDOH assessments and interventions completed:  Yes     Care Coordination Interventions Activated:  Yes  Care Coordination Interventions:  Yes, provided   Follow up plan: Follow up call scheduled for November    Encounter Outcome:  Pt. Visit Completed   Jone Baseman, RN, MSN [place] [facility] Care Management Coordinator Direct Line [phone]

## 2022-06-27 NOTE — Patient Instructions (Signed)
Visit Information  Thank you for taking time to visit with me today. Please don't hesitate to contact me if I can be of assistance to you.   Following are the goals we discussed today:   Goals Addressed             This Visit's Progress    Assistance with Respite Care       Care Coordination Interventions: Social Work referral for help with respite care set up   Patient now doing water therapy and Parkinson's therapy at the [facility].  Still wanting to get in home -respite set up through insurance.  Social work to follow up again.        Our next appointment is by telephone on 07/11/22 at 1030  Please call the care guide team at [phone] if you need to cancel or reschedule your appointment.   If you are experiencing a Mental Health or [place] or need someone to talk to, please call the Suicide and Crisis Lifeline: 988   Patient verbalizes understanding of instructions and care plan provided today and agrees to view in [place]. Active MyChart status and patient understanding of how to access instructions and care plan via MyChart confirmed with patient.     Telephone follow up appointment with care management team member scheduled for: November  Braedyn Riggle J Rigel Filsinger, RN, MSN [place] [facility] Care Management Coordinator Direct Line [phone]

## 2022-06-29 ENCOUNTER — Ambulatory Visit (HOSPITAL_BASED_OUTPATIENT_CLINIC_OR_DEPARTMENT_OTHER): Payer: Self-pay | Admitting: Physical Therapy

## 2022-06-30 ENCOUNTER — Ambulatory Visit (HOSPITAL_BASED_OUTPATIENT_CLINIC_OR_DEPARTMENT_OTHER): Payer: Medicare Other | Attending: Family Medicine | Admitting: Physical Therapy

## 2022-06-30 ENCOUNTER — Encounter (HOSPITAL_BASED_OUTPATIENT_CLINIC_OR_DEPARTMENT_OTHER): Payer: Self-pay | Admitting: Physical Therapy

## 2022-06-30 DIAGNOSIS — G20A1 Parkinson's disease without dyskinesia, without mention of fluctuations: Secondary | ICD-10-CM | POA: Insufficient documentation

## 2022-06-30 DIAGNOSIS — R2689 Other abnormalities of gait and mobility: Secondary | ICD-10-CM | POA: Diagnosis not present

## 2022-06-30 NOTE — Therapy (Signed)
OUTPATIENT PHYSICAL THERAPY LOWER EXTREMITY TREATMENT   Patient Name: Morgan [name] MRN: [id number] DOB:1950-12-27, 71 y.o., female Today's Date: 06/30/2022   PT End of Session - 06/30/22 1530     Visit Number 4    Number of Visits 16    Date for PT Re-Evaluation 08/10/22    PT Start Time 47    PT Stop Time 1609    PT Time Calculation (min) 39 min    Activity Tolerance --    Behavior During Therapy WFL for tasks assessed/performed              Past Medical History:  Diagnosis Date   Anxiety    Fibromyalgia    Headache    Hypercholesteremia    controlled on medication   Hyperlipidemia    Hypertension    no current meds   LBBB (left bundle branch block)    Melanoma (HCC)    MVP (mitral valve prolapse)    Osteopenia    Parkinson disease    Pneumonia    SVD (spontaneous vaginal delivery)    x 2   Past Surgical History:  Procedure Laterality Date   BLEPHAROPLASTY     cold knife conization     COLONOSCOPY     FH colon CA   CYSTOSCOPY W/ URETERAL STENT PLACEMENT Left 10/18/2021   Procedure: CYSTOSCOPY WITH RETROGRADE PYELOGRAM, URETERAL STENT PLACEMENT;  Surgeon: Festus Aloe, MD;  Location: [facility];  Service: Urology;  Laterality: Left;   DILATATION & CURETTAGE/HYSTEROSCOPY WITH MYOSURE N/A 02/14/2018   Procedure: ATTEMPTED DILATATION & CURETTAGE/HYSTEROSCOPY WITH MYOSURE;  Surgeon: Christophe Louis, MD;  Location: [facility];  Service: Gynecology;  Laterality: N/A;  polyp   melanoma cancer     removed from left leg   RECONSTRUCTION OF EYELID     TONSILLECTOMY     TUBAL LIGATION     URETEROSCOPY WITH HOLMIUM LASER LITHOTRIPSY Left 12/17/2021   Procedure: URETEROSCOPY/HOLMIUM LASER/STENT EXCHANGE;  Surgeon: Festus Aloe, MD;  Location: [facility];  Service: Urology;  Laterality: Left;   WISDOM TOOTH EXTRACTION     Patient Active Problem List   Diagnosis Date Noted   COVID-19 virus infection 05/16/2022   HTN (hypertension) 10/20/2021   Physical  deconditioning 10/20/2021   Hydronephrosis with infection 10/18/2021   Bacteremia due to Escherichia coli 10/18/2021   Severe sepsis ([place]) 10/17/2021   UTI (urinary tract infection) 40/05/2724   Acute metabolic encephalopathy 36/64/4034   Parkinson disease 10/17/2021   Cervical stenosis (uterine cervix) 02/14/2018   Postmenopausal bleeding 02/14/2018   LBBB (left bundle branch block) 12/25/2015   PVCs (premature ventricular contractions) 12/25/2015   Hyperlipidemia 12/25/2015   Fibromyalgia 01/16/2014   Cholelithiasis 04/25/2013   Paralysis agitans 01/15/2013   Unspecified hereditary and idiopathic peripheral neuropathy 01/15/2013   [name] MD PCP:   Loistine Chance PROVIDER: Hulan Saas MD   REFERRING DIAG: Parkinsons   THERAPY DIAG:  Other abnormalities of gait and mobility  Parkinson's disease without dyskinesia or fluctuating manifestations  Rationale for Evaluation and Treatment Rehabilitation  ONSET DATE: Several years   SUBJECTIVE:   SUBJECTIVE STATEMENT: Pt reports she was sore and tired after last session.  She reports she likes the morning appts better than PM; more tired in the afternoons.  Dark purple bruise is still present on Rt hip. Pt reports she is not interested in doing water exercise after PT is over, she is giving this a try (at Dr's recommendation) to see if it reduces her muscular pain.  Pt  and husband report that she had biopsies of muscles to "see if she has Parkinsons".    She is transported via [facility] by husband, who is present on deck during part of session.    From EVAL: The patient has had Parkinsons for many years. She has had land therapy with varying degrees of success. She has recently had an onset of bilateral leg pain that comes and goes. The pain is mostly in the front but can go down to the back as well. She is a member of the gym. She has frequent falls.   PERTINENT HISTORY: Fibromyalgia; headaches, LBBB; osteopenia   PAIN:  Are you  having pain? Yes: NPRS scale: 3/10 right now Pain location: down the front of her legs and and go to the back of calf's  (L>R) Pain description: sharp ache  Aggravating factors: just comes and goes  Relieving factors: just comes and goes; sometimes ibuprofin works but she is limited as to how much she can take  PRECAUTIONS: Patient reports that because of her heart she will pass out at times.   WEIGHT BEARING RESTRICTIONS No  FALLS:  Has patient fallen in last 6 months? Yes. Number of falls 3-4 most recently slipped on something in the closet and fell face first    LIVING ENVIRONMENT: Stairs to the second floor in her house but she dosen't do much  4 steps in the front  2 steps in the garage  OCCUPATION: retired   Office manager: reading; goes to an exercise class   PLOF: Independent with household mobility with device  PATIENT GOALS  To have less pain in her legs and to move better    OBJECTIVE: * findings taken at evaluation, unless otherwise noted.   DIAGNOSTIC FINDINGS:   PATIENT SURVEYS:  FOTO    COGNITION:  Overall cognitive status: Within functional limits for tasks assessed     SENSATION: Denies paresthesias   POSTURE: No Significant postural limitations  PALPATION: No trigger points noted   LOWER EXTREMITY ROM:  Passive ROM Right eval Left eval  Hip flexion    Hip extension    Hip abduction    Hip adduction    Hip internal rotation    Hip external rotation    Knee flexion    Knee extension    Ankle dorsiflexion    Ankle plantarflexion    Ankle inversion    Ankle eversion     (Blank rows = not tested)  WNL  LOWER EXTREMITY MMT:  MMT Right eval Left eval  Hip flexion 21.7 29.7  Hip extension    Hip abduction 27.6 18.5  Hip adduction    Hip internal rotation    Hip external rotation    Knee flexion    Knee extension 23.5 20.2  Ankle dorsiflexion    Ankle plantarflexion    Ankle inversion    Ankle eversion     (Blank rows = not  tested)  GAIT: Decreased hip flexion; leans to the left; right hip frop. Patient required CGA while walking and min a when she turned for balance.   Functional tests:  Sit to stand: uses's hands and needs several trials to stand from the table. Poor intial standing balance    Balance:   Narrow base min a  Narrow base eyes closed min to mod a  Tandem stance Min a both ways.    TODAY'S TREATMENT: Pt seen for aquatic therapy today.  Treatment took place in water 3.25-4.5 ft in depth at  the [employer] pool. Temp of water was 92.  Pt entered/exited the pool via steps SBA with hand rail.  WC transport to/from pool and to/from stairs at pool.   Therapist in water with patient due to decreased balance:  Holding wall:  heel raises x 10; hip abdct x 10 each (cues to keep Lt knee straight)  Holding white barbell:  forward / backward gait and side stepping, cues to slow speed of legs for less shuffling. High knee marching.  At bench with blue step under feet:  STS holding white barbell x 5 reps CGA/SBA; 4 reps without UE support and SBA, cues for ant weight shift Seated on bench:  cycling (2 rounds); alternating LAQ with DF.  Passive calf/hamstring stretch from therapist (limited tolerance) Standing holding onto wall:  quad stretch with LE supported by thin square noodle (therapist placing noodle at ankle) x 15s x 2 reps each LE Holding  white barbell: SLS x 15 each LE; forward walking; monster walk forward/ backward (cues to slow pace backwards)   Pt requires the buoyancy and hydrostatic pressure of water for support, and to offload joints by unweighting joint load by at least 50 % in navel deep water and by at least 75-80% in chest to neck deep water.  Viscosity of the water is needed for resistance of strengthening. Water current perturbations provides challenge to standing balance requiring increased core activation.     PATIENT EDUCATION:  Education details: safety with gait;  aquatics progression  Person educated: Patient Education method: Explanation, Demonstration, Tactile cues, Verbal cues, and Handouts Education comprehension: verbalized understanding, returned demonstration, verbal cues required, tactile cues required, and needs further education   HOME EXERCISE PROGRAM:  has a program already; aquatic HEP TBD  ASSESSMENT:  CLINICAL IMPRESSION: Pt's gait more smooth and steady when pt focused on cues. The pool area had a lot going on and she reports how distracted she was today.  Pain in R thigh increased with exercises today.  Pt given SBA throughout session; no overt LOB.  Pt reporting increased fatigue in LEs during session; was given short standing rest breaks between exercises. Pt became aggravated with repeated questions in regards to her tolerance to activity throughout session.   Deferred squats as this increased her pain last session. Limited tolerance for LE stretches.  Pt will be out of town for 2 weeks after this session.   Goals are ongoing.     OBJECTIVE IMPAIRMENTS Abnormal gait, decreased activity tolerance, decreased balance, decreased mobility, difficulty walking, decreased strength, impaired tone, and pain.   ACTIVITY LIMITATIONS carrying, lifting, bending, standing, squatting, stairs, transfers, bed mobility, dressing, and locomotion level  PARTICIPATION LIMITATIONS: meal prep, cleaning, laundry, driving, shopping, community activity, and yard work  PERSONAL FACTORS Fibromyalgia; headaches, LBBB; osteopenia  are also affecting patient's functional outcome.   REHAB POTENTIAL: Good  CLINICAL DECISION MAKING: Evolving/moderate complexity increasing pain in her quads   EVALUATION COMPLEXITY: Moderate   GOALS: Goals reviewed with patient? Yes  SHORT TERM GOALS: Target date:  Patient will transfer sit to stand smoothly and independently  Baseline: Goal status: INITIAL  2.  Patient will increase gross strength by 5lbs  Baseline:   Goal status: INITIAL  3.  Patient will be independent with base pool program  Baseline:  Goal status: INITIAL LONG TERM GOALS: Target date:   Patient will go improve balance to CGA for all balance exercises to improve safety  Baseline:  Goal status: INITIAL  2.  Patient will  reach FOTO goal to demonstrate improved function  Baseline:  Goal status: INITIAL  3.  Patient will have a full balance program  Baseline:  Goal status: INITIAL   PLAN: PT FREQUENCY: 2x/week  PT DURATION: 8 weeks  PLANNED INTERVENTIONS: Therapeutic exercises, Therapeutic activity, Neuromuscular re-education, Balance training, Gait training, Patient/Family education, Self Care, Joint mobilization, Stair training, Aquatic Therapy, Cryotherapy, Moist heat, Taping, Manual therapy, and Re-evaluation  PLAN FOR NEXT SESSION: continue with gait and balance training in the pool. Be aware of bradycardia.    Kerin Perna, PTA 06/30/22 5:53 PM [facility] [address] [place], [place], [address] Phone: [phone]   Fax:  [phone]

## 2022-07-01 ENCOUNTER — Ambulatory Visit: Payer: Self-pay | Admitting: Licensed Clinical Social Worker

## 2022-07-01 NOTE — Patient Outreach (Signed)
  Care Coordination   Follow Up Visit Note   07/01/2022 Name: Morgan [name] MRN: [id number] DOB: Oct 24, 1950  Morgan [name] is a 71 y.o. year old female who sees [name], MD for primary care. I spoke with  Morgan [name] by phone today.  What matters to the patients health and wellness today?  Respite Care. SW recommended assistance through [place], [place]. Patient denies wanting to seek assistance due to being a hassle of a process. SW continues to encourage patient to seek assistants. SW encourages patient to attend care givers support groups. Patient states he doesn't enjoy zoom meetings and doesn't have support to leave his wife alone. Patient states he and his wife is going on vacation for ten days.  Patients states he found a senior center that may provide assistance. Patient is not willing to private pay. And not willing to place wife in rehab. Patient will reach out to SW if further assistance is needed.    Goals Addressed               This Visit's Progress     MSW Respite Care Interventions (pt-stated)        SW offered to call insurance company with patient and spouse to navigate benefits. Patient and spouse stated they weren't ready to call insurance and would call at  a later time and contact SW back with information.   SW gave contact information for DHHS in home care. Patient denied assistance with PCS. SW iformed patient to inquire about respite. Patient agreed.   SW sent all contact information ( SW Psychologist, counselling, [place] customer services contact and DHHS contact via email as requested by patient).   Patient stated he denied sending spouse to rehab facilities In [place].  Patient denied needing any further assistance. Patient stated he would contact SW back when ready.          SDOH assessments and interventions completed:  Yes     Care Coordination Interventions Activated:  Yes  Care Coordination Interventions:  Yes, provided   Follow up plan: No further  intervention required.   Encounter Outcome:  Pt. Visit Completed

## 2022-07-01 NOTE — Patient Instructions (Signed)
Visit Information  Thank you for taking time to visit with me today. Please don't hesitate to contact me if I can be of assistance to you.   Following are the goals we discussed today:   Goals Addressed               This Visit's Progress     MSW Respite Care Interventions (pt-stated)        SW offered to call insurance company with patient and spouse to navigate benefits. Patient and spouse stated they weren't ready to call insurance and would call at  a later time and contact SW back with information.   SW gave contact information for DHHS in home care. Patient denied assistance with PCS. SW iformed patient to inquire about respite. Patient agreed.   SW sent all contact information ( SW Psychologist, counselling, [place] customer services contact and DHHS contact via email as requested by patient).   Patient stated he denied sending spouse to rehab facilities In [place].  Patient denied needing any further assistance. Patient stated he would contact SW back when ready.            Patient verbalizes understanding of instructions and care plan provided today and agrees to view in [place]. Active MyChart status and patient understanding of how to access instructions and care plan via MyChart confirmed with patient.     No further follow up required: .  Milus Height, Arita Miss, MSW, [place]  Social Worker [facility] Care Management  [phone]

## 2022-07-05 ENCOUNTER — Ambulatory Visit: Payer: Self-pay | Admitting: Licensed Clinical Social Worker

## 2022-07-05 NOTE — Patient Outreach (Signed)
  Care Coordination   Initial Visit Note   07/05/2022 Name: JLA REYNOLDS MRN: [id number] DOB: Jul 15, 1951  Vivi Barrack Fitzhenry is a 71 y.o. year old female who sees [name], MD for primary care. I  spoke with representative from [employer] school of nursing. I left a message for the department to contact SW if any nursing student were interested in assisting patient.  As of now, no current nursing student were interested.   What matters to the patients health and wellness today?  Respite Care   Goals Addressed   None     SDOH assessments and interventions completed:  Yes     Care Coordination Interventions Activated:  Yes  Care Coordination Interventions:  Yes, provided   Follow up plan: No further intervention required.   Encounter Outcome:  Pt. Visit Completed

## 2022-07-11 ENCOUNTER — Ambulatory Visit: Payer: Self-pay

## 2022-07-11 NOTE — Patient Outreach (Signed)
  Care Coordination   07/11/2022 Name: Morgan [name] MRN: [id number] DOB: Sep 14, 1950   Care Coordination Outreach Attempts:  An unsuccessful telephone outreach was attempted today to offer the patient information about available care coordination services as a benefit of their health plan.   Follow Up Plan:  Additional outreach attempts will be made to offer the patient care coordination information and services.   Encounter Outcome:  No Answer  Care Coordination Interventions Activated:  No   Care Coordination Interventions:  No, not indicated    Jone Baseman, RN, MSN [facility] Care Management Care Management Coordinator Direct Line [phone]

## 2022-07-15 NOTE — Progress Notes (Unsigned)
[facility] [place] Phone: [phone] Subjective:   Morgan [name], am serving as a scribe for Dr. Hulan Saas.  I'm seeing this patient by the request  of:  [name], MD  CC: Low back pain  DZH:[id number]  10/4/203 Patient does have significant tremor at baseline.  Likely [name] significant cogwheeling.  Patient shows [name] signs and symptoms concerning for any recent stroke at the moment.  I do think though patient does have abnormal gait secondary to her underlying pathology.  I do feel that further treatment with formal physical therapy for balance and coordination including patient's classes with other Parkinson patient will be the most beneficial for her.  Patient is on gabapentin and we did discuss that this could potentially contribute to some of this and may want to consider decreasing.  Patient has not been using it as prescribed anyhow at the moment.  We discussed that strengthening would be helpful.  We discussed the possibility of aquatic therapy as well.  We will add that onto the physical therapy.  Patient will come back and see me again in 4 to 6 weeks to see how she is responding to conservative therapy.     Update 07/19/2022 Morgan [name] is a 71 y.o. female coming in with complaint of parkinson's disease. Patient has been going to PT. Patient states that she is about the same as last visit. She is doing PT but does not notice any difference. Using '3000mg'$  of tart cherry at night which is not helping.     Past Medical History:  Diagnosis Date   Anxiety    Fibromyalgia    Headache    Hypercholesteremia    controlled on medication   Hyperlipidemia    Hypertension    [name] current meds   LBBB (left bundle branch block)    Melanoma (HCC)    MVP (mitral valve prolapse)    Osteopenia    Parkinson disease    Pneumonia    SVD (spontaneous vaginal delivery)    x 2   Past Surgical History:  Procedure  Laterality Date   BLEPHAROPLASTY     cold knife conization     COLONOSCOPY     FH colon CA   CYSTOSCOPY W/ URETERAL STENT PLACEMENT Left 10/18/2021   Procedure: CYSTOSCOPY WITH RETROGRADE PYELOGRAM, URETERAL STENT PLACEMENT;  Surgeon: Festus Aloe, MD;  Location: [facility];  Service: Urology;  Laterality: Left;   DILATATION & CURETTAGE/HYSTEROSCOPY WITH MYOSURE N/A 02/14/2018   Procedure: ATTEMPTED DILATATION & CURETTAGE/HYSTEROSCOPY WITH MYOSURE;  Surgeon: Christophe Louis, MD;  Location: [facility];  Service: Gynecology;  Laterality: N/A;  polyp   melanoma cancer     removed from left leg   RECONSTRUCTION OF EYELID     TONSILLECTOMY     TUBAL LIGATION     URETEROSCOPY WITH HOLMIUM LASER LITHOTRIPSY Left 12/17/2021   Procedure: URETEROSCOPY/HOLMIUM LASER/STENT EXCHANGE;  Surgeon: Festus Aloe, MD;  Location: [facility];  Service: Urology;  Laterality: Left;   WISDOM TOOTH EXTRACTION     Social History   Socioeconomic History   Marital status: Married    Spouse name: Not on file   Number of children: 2   Years of education: Not on file   Highest education level: Bachelor's degree (e.g., BA, AB, BS)  Occupational History   Occupation: retired    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  Tobacco Use   Smoking status: Former    Packs/day: 0.25  Types: Cigarettes    Quit date: 09/29/1997    Years since quitting: 24.8   Smokeless tobacco: Never   Tobacco comments:    chews [place], 10-12/day  Vaping Use   Vaping Use: Never used  Substance and Sexual Activity   Alcohol use: Yes    Comment: Occasional wine   Drug use: [name]   Sexual activity: Not on file  Other Topics Concern   Not on file  Social History Narrative   right handed   2 story home    Lives with spouse   Social Determinants of Health   Financial Resource Strain: Not on file  Food Insecurity: [name] Food Insecurity (05/17/2022)   Hunger Vital Sign    Worried About Running Out of Food in the Last Year: Never true    Ran Out  of Food in the Last Year: Never true  Transportation Needs: [name] Transportation Needs (05/31/2022)   PRAPARE - Hydrologist (Medical): [name]    Lack of Transportation (Non-Medical): [name]  Physical Activity: Not on file  Stress: Not on file  Social Connections: Not on file   Allergies  Allergen Reactions   Other Anaphylaxis    "Anti Seizure Medications"   Phenergan [Promethazine Hcl] Other (See Comments)    Makes Parkinson's worse   Acetaminophen Other (See Comments)    "I just get weird"   Itraconazole Hives   Tamsulosin Other (See Comments)    B/P DROPPED TOO MUCH!!   Family History  Problem Relation Age of Onset   Cancer Mother        colon   Healthy Brother    Healthy Sister    Healthy Child     Current Outpatient Medications (Endocrine & Metabolic):    raloxifene (EVISTA) 60 MG tablet, Take 60 mg by mouth in the morning.  Current Outpatient Medications (Cardiovascular):    atorvastatin (LIPITOR) 10 MG tablet, Take 10 mg by mouth in the morning.   midodrine (PROAMATINE) 2.5 MG tablet, Take 1 tablet (2.5 mg total) by mouth 3 (three) times daily as needed (for systolic blood pressure less than 100).   Current Outpatient Medications (Analgesics):    aspirin EC 81 MG tablet, Take 81 mg by mouth daily. Swallow whole.   Ibuprofen 200 MG CAPS, Take 2 capsules (400 mg total) by mouth every 8 (eight) hours as needed (Leg pain, headache or mild pain).   Current Outpatient Medications (Other):    ALPRAZolam (XANAX) 0.25 MG tablet, Take 0.25 mg by mouth daily as needed for anxiety.    BLINK TEARS 0.25 % SOLN, Place 1 drop into both eyes 3 (three) times daily as needed (for dryness).   carbidopa-levodopa (SINEMET IR) 25-100 MG tablet, TAKE 1 TABLET EVERY 2 HOURS UP TO 8 TABLETS PER DAY   Cholecalciferol (VITAMIN D3) 25 MCG (1000 UT) CHEW, Chew 2,500 Units by mouth daily.   Coenzyme Q10-Vitamin E (QUNOL ULTRA COQ10 PO), Take 1 capsule by mouth daily.    CYMBALTA 60 MG capsule, Take 1 capsule (60 mg total) by mouth in the morning.   gabapentin (NEURONTIN) 400 MG capsule, Take 1 capsule (400 mg total) by mouth 3 (three) times daily as needed (for leg pain or soreness).   methocarbamol (ROBAXIN) 750 MG tablet, Take 750 mg by mouth every 6 (six) hours as needed for muscle spasms (or leg pain).   Multiple Vitamins-Minerals (PRESERVISION AREDS 2 PO), Take 1 capsule by mouth 2 (two) times daily.   ramelteon (ROZEREM)  8 MG tablet, TAKE 1 TABLET AT BEDTIME   TART CHERRY PO, Take 3,000 mg by mouth daily.   Reviewed prior external information including notes and imaging from  primary care provider As well as notes that were available from care everywhere and other healthcare systems.  Past medical history, social, surgical and family history all reviewed in electronic medical record.  [name] pertanent information unless stated regarding to the chief complaint.   Review of Systems:  [name] headache, visual changes, nausea, vomiting, diarrhea, constipation, dizziness, abdominal pain, skin rash, fevers, chills, night sweats, weight loss, swollen lymph nodes, , joint swelling, chest pain, shortness of breath, mood changes. POSITIVE muscle aches, body aches  Objective  Blood pressure (!) 122/92, height '5\' 5"'$  (1.651 m), weight 154 lb (69.9 kg).   General: [name] apparent distress alert and oriented x3 mood and affect normal, dressed appropriately.  Patient does have mild masked facies and continues to have almost a tardive dyskinesia. HEENT: Pupils equal, extraocular movements intact  Respiratory: Patient's speak in full sentences and does not appear short of breath  Cardiovascular: [name] lower extremity edema, non tender, [name] erythema  Or weakness of the lower extremities noted.  Patient does have some very mild cogwheeling noted with range of motion.  Patient does ambulate with some mild shuffling gait as well.    Impression and Recommendations:    The above  documentation has been reviewed and is accurate and complete Lyndal Pulley, DO

## 2022-07-18 ENCOUNTER — Ambulatory Visit (HOSPITAL_BASED_OUTPATIENT_CLINIC_OR_DEPARTMENT_OTHER): Payer: Medicare Other | Admitting: Physical Therapy

## 2022-07-18 ENCOUNTER — Telehealth: Payer: Self-pay | Admitting: Neurology

## 2022-07-18 ENCOUNTER — Encounter (HOSPITAL_BASED_OUTPATIENT_CLINIC_OR_DEPARTMENT_OTHER): Payer: Self-pay | Admitting: Physical Therapy

## 2022-07-18 DIAGNOSIS — G20A1 Parkinson's disease without dyskinesia, without mention of fluctuations: Secondary | ICD-10-CM

## 2022-07-18 DIAGNOSIS — R2689 Other abnormalities of gait and mobility: Secondary | ICD-10-CM | POA: Diagnosis not present

## 2022-07-18 NOTE — Telephone Encounter (Signed)
Called and left voicemail for patient.

## 2022-07-18 NOTE — Therapy (Signed)
OUTPATIENT PHYSICAL THERAPY LOWER EXTREMITY TREATMENT   Patient Name: Morgan [name] MRN: [id number] DOB:March 12, 1951, 71 y.o., female Today's Date: 06/30/2022   PT End of Session - 06/30/22 1530     Visit Number 4    Number of Visits 16    Date for PT Re-Evaluation 08/10/22    PT Start Time 23    PT Stop Time 1609    PT Time Calculation (min) 39 min    Activity Tolerance --    Behavior During Therapy WFL for tasks assessed/performed              Past Medical History:  Diagnosis Date   Anxiety    Fibromyalgia    Headache    Hypercholesteremia    controlled on medication   Hyperlipidemia    Hypertension    no current meds   LBBB (left bundle branch block)    Melanoma (HCC)    MVP (mitral valve prolapse)    Osteopenia    Parkinson disease    Pneumonia    SVD (spontaneous vaginal delivery)    x 2   Past Surgical History:  Procedure Laterality Date   BLEPHAROPLASTY     cold knife conization     COLONOSCOPY     FH colon CA   CYSTOSCOPY W/ URETERAL STENT PLACEMENT Left 10/18/2021   Procedure: CYSTOSCOPY WITH RETROGRADE PYELOGRAM, URETERAL STENT PLACEMENT;  Surgeon: Festus Aloe, MD;  Location: [facility];  Service: Urology;  Laterality: Left;   DILATATION & CURETTAGE/HYSTEROSCOPY WITH MYOSURE N/A 02/14/2018   Procedure: ATTEMPTED DILATATION & CURETTAGE/HYSTEROSCOPY WITH MYOSURE;  Surgeon: Christophe Louis, MD;  Location: [facility];  Service: Gynecology;  Laterality: N/A;  polyp   melanoma cancer     removed from left leg   RECONSTRUCTION OF EYELID     TONSILLECTOMY     TUBAL LIGATION     URETEROSCOPY WITH HOLMIUM LASER LITHOTRIPSY Left 12/17/2021   Procedure: URETEROSCOPY/HOLMIUM LASER/STENT EXCHANGE;  Surgeon: Festus Aloe, MD;  Location: [facility];  Service: Urology;  Laterality: Left;   WISDOM TOOTH EXTRACTION     Patient Active Problem List   Diagnosis Date Noted   COVID-19 virus infection 05/16/2022   HTN (hypertension) 10/20/2021   Physical  deconditioning 10/20/2021   Hydronephrosis with infection 10/18/2021   Bacteremia due to Escherichia coli 10/18/2021   Severe sepsis ([place]) 10/17/2021   UTI (urinary tract infection) 84/66/5993   Acute metabolic encephalopathy 57/08/7791   Parkinson disease 10/17/2021   Cervical stenosis (uterine cervix) 02/14/2018   Postmenopausal bleeding 02/14/2018   LBBB (left bundle branch block) 12/25/2015   PVCs (premature ventricular contractions) 12/25/2015   Hyperlipidemia 12/25/2015   Fibromyalgia 01/16/2014   Cholelithiasis 04/25/2013   Paralysis agitans 01/15/2013   Unspecified hereditary and idiopathic peripheral neuropathy 01/15/2013   [name] MD PCP:   REFERRING PROVIDER: Hulan Saas MD   REFERRING DIAG: Parkinsons   THERAPY DIAG:  Other abnormalities of gait and mobility  Parkinson's disease without dyskinesia or fluctuating manifestations  Rationale for Evaluation and Treatment Rehabilitation  ONSET DATE: Several years   SUBJECTIVE:   SUBJECTIVE STATEMENT: Patient has been on vaction for 10 days. She reports no major difference   From EVAL: The patient has had Parkinsons for many years. She has had land therapy with varying degrees of success. She has recently had an onset of bilateral leg pain that comes and goes. The pain is mostly in the front but can go down to the back as well. She is a member  of the gym. She has frequent falls.   PERTINENT HISTORY: Fibromyalgia; headaches, LBBB; osteopenia   PAIN:  Are you having pain? Yes: NPRS scale: 3/10 right now Pain location: down the front of her legs and and go to the back of calf's  (L>R) Pain description: sharp ache  Aggravating factors: just comes and goes  Relieving factors: just comes and goes; sometimes ibuprofin works but she is limited as to how much she can take  PRECAUTIONS: Patient reports that because of her heart she will pass out at times.   WEIGHT BEARING RESTRICTIONS No  FALLS:  Has patient  fallen in last 6 months? Yes. Number of falls 3-4 most recently slipped on something in the closet and fell face first    LIVING ENVIRONMENT: Stairs to the second floor in her house but she dosen't do much  4 steps in the front  2 steps in the garage  OCCUPATION: retired   Office manager: reading; goes to an exercise class   PLOF: Independent with household mobility with device  PATIENT GOALS  To have less pain in her legs and to move better    OBJECTIVE: * findings taken at evaluation, unless otherwise noted.   DIAGNOSTIC FINDINGS:   PATIENT SURVEYS:  FOTO    COGNITION:  Overall cognitive status: Within functional limits for tasks assessed     SENSATION: Denies paresthesias   POSTURE: No Significant postural limitations  PALPATION: No trigger points noted   LOWER EXTREMITY ROM:  Passive ROM Right eval Left eval  Hip flexion    Hip extension    Hip abduction    Hip adduction    Hip internal rotation    Hip external rotation    Knee flexion    Knee extension    Ankle dorsiflexion    Ankle plantarflexion    Ankle inversion    Ankle eversion     (Blank rows = not tested)  WNL  LOWER EXTREMITY MMT:  MMT Right eval Left eval  Hip flexion 21.7 29.7  Hip extension    Hip abduction 27.6 18.5  Hip adduction    Hip internal rotation    Hip external rotation    Knee flexion    Knee extension 23.5 20.2  Ankle dorsiflexion    Ankle plantarflexion    Ankle inversion    Ankle eversion     (Blank rows = not tested)  GAIT: Decreased hip flexion; leans to the left; right hip frop. Patient required CGA while walking and min a when she turned for balance.   Functional tests:  Sit to stand: uses's hands and needs several trials to stand from the table. Poor intial standing balance    Balance:   Narrow base min a  Narrow base eyes closed min to mod a  Tandem stance Min a both ways.    TODAY'S TREATMENT:  11/20 Pt seen for aquatic therapy today.   Treatment took place in water 3.25-4.5 ft in depth at the [facility]. Temp of water was 92.  Pt entered/exited the pool via steps SBA with hand rail.  WC transport to/from pool and to/from stairs at pool.   Therapist in water with patient due to decreased balance:  Holding wall:  heel raises x 10; hip abdct x 10 each (cues to keep Lt knee straight)  Holding white barbell:  forward / backward gait and side stepping, cues to slow speed of legs for less shuffling. High knee marching.  At bench with blue  step under feet:  STS holding white barbell x 5 reps CGA/SBA; 4 reps without UE support and SBA, cues for ant weight shift Seated on bench:  cycling (2 rounds); alternating LAQ with DF.  Passive calf/hamstring stretch from therapist (limited tolerance) Standing holding onto wall:  quad stretch with LE supported by thin square noodle (therapist placing noodle at ankle) x 15s x 2 reps each LE Holding  white barbell: SLS x 15 each LE; forward walking; monster walk forward/ backward (cues to slow pace backwards)   Pt requires the buoyancy and hydrostatic pressure of water for support, and to offload joints by unweighting joint load by at least 50 % in navel deep water and by at least 75-80% in chest to neck deep water.  Viscosity of the water is needed for resistance of strengthening. Water current perturbations provides challenge to standing balance requiring increased core activation.   Last visit: Pt seen for aquatic therapy today.  Treatment took place in water 3.25-4.5 ft in depth at the [facility]. Temp of water was 92.  Pt entered/exited the pool via steps SBA with hand rail.  WC transport to/from pool and to/from stairs at pool.   Therapist in water with patient due to decreased balance:  Holding wall:  heel raises x 10; hip abdct x 10 each (cues to keep Lt knee straight)  Holding white barbell:  forward / backward gait and side stepping, cues to slow speed of legs  for less shuffling. High knee marching.  At bench with blue step under feet:  STS holding white barbell x 5 reps CGA/SBA; 4 reps without UE support and SBA, cues for ant weight shift Seated on bench:  cycling (2 rounds); alternating LAQ with DF.  Passive calf/hamstring stretch from therapist (limited tolerance) Standing holding onto wall:  quad stretch with LE supported by thin square noodle (therapist placing noodle at ankle) x 15s x 2 reps each LE Holding  white barbell: SLS x 15 each LE; forward walking; monster walk forward/ backward (cues to slow pace backwards)   Pt requires the buoyancy and hydrostatic pressure of water for support, and to offload joints by unweighting joint load by at least 50 % in navel deep water and by at least 75-80% in chest to neck deep water.  Viscosity of the water is needed for resistance of strengthening. Water current perturbations provides challenge to standing balance requiring increased core activation.     PATIENT EDUCATION:  Education details: safety with gait; aquatics progression  Person educated: Patient Education method: Explanation, Demonstration, Tactile cues, Verbal cues, and Handouts Education comprehension: verbalized understanding, returned demonstration, verbal cues required, tactile cues required, and needs further education   HOME EXERCISE PROGRAM:  has a program already; aquatic HEP TBD  ASSESSMENT:  CLINICAL IMPRESSION: The patient had some difficulty getting into the pool today. She had to sit down because she lost her balance but it was in the water. So she was able to just sit. Her balance improved with her exercises. She    OBJECTIVE IMPAIRMENTS Abnormal gait, decreased activity tolerance, decreased balance, decreased mobility, difficulty walking, decreased strength, impaired tone, and pain.   ACTIVITY LIMITATIONS carrying, lifting, bending, standing, squatting, stairs, transfers, bed mobility, dressing, and locomotion  level  PARTICIPATION LIMITATIONS: meal prep, cleaning, laundry, driving, shopping, community activity, and yard work  PERSONAL FACTORS Fibromyalgia; headaches, LBBB; osteopenia  are also affecting patient's functional outcome.   REHAB POTENTIAL: Good  CLINICAL DECISION MAKING: Evolving/moderate complexity increasing pain  in her quads   EVALUATION COMPLEXITY: Moderate   GOALS: Goals reviewed with patient? Yes  SHORT TERM GOALS: Target date:  Patient will transfer sit to stand smoothly and independently  Baseline: Goal status: INITIAL  2.  Patient will increase gross strength by 5lbs  Baseline:  Goal status: INITIAL  3.  Patient will be independent with base pool program  Baseline:  Goal status: INITIAL LONG TERM GOALS: Target date:   Patient will go improve balance to CGA for all balance exercises to improve safety  Baseline:  Goal status: INITIAL  2.  Patient will reach FOTO goal to demonstrate improved function  Baseline:  Goal status: INITIAL  3.  Patient will have a full balance program  Baseline:  Goal status: INITIAL   PLAN: PT FREQUENCY: 2x/week  PT DURATION: 8 weeks  PLANNED INTERVENTIONS: Therapeutic exercises, Therapeutic activity, Neuromuscular re-education, Balance training, Gait training, Patient/Family education, Self Care, Joint mobilization, Stair training, Aquatic Therapy, Cryotherapy, Moist heat, Taping, Manual therapy, and Re-evaluation  PLAN FOR NEXT SESSION: continue with gait and balance training in the pool. Be aware of bradycardia.    Carolyne Littles PT DPT  06/30/22 5:53 PM [facility] [address] [place], [place], [address] Phone: [phone]   Fax:  [phone]

## 2022-07-18 NOTE — Telephone Encounter (Signed)
Pts skin biopsy came back.  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  evidence of small fiber neuropathy (although was in the thigh site 3.  No evidence of amyloid deposition within the cutaneous nerves  Please call pt/family and let them know that not unexpectedly there was evidence of alpha synuclein (the hallmark pathologic feature of Parkinsons Disease) in all biopsy sites taken.  There is nothing further to do/change in terms of tx.  Can discuss more next visit.

## 2022-07-19 ENCOUNTER — Ambulatory Visit (INDEPENDENT_AMBULATORY_CARE_PROVIDER_SITE_OTHER): Payer: Medicare Other | Admitting: Family Medicine

## 2022-07-19 ENCOUNTER — Encounter (HOSPITAL_BASED_OUTPATIENT_CLINIC_OR_DEPARTMENT_OTHER): Payer: Self-pay | Admitting: Physical Therapy

## 2022-07-19 VITALS — BP 122/92 | Ht 65.0 in | Wt 154.0 lb

## 2022-07-19 DIAGNOSIS — G20B2 Parkinson's disease with dyskinesia, with fluctuations: Secondary | ICD-10-CM | POA: Diagnosis not present

## 2022-07-19 NOTE — Assessment & Plan Note (Signed)
Continues to have significant difficulty.  Does have significant dynamics with patient's primary caregiver who is continuing to have difficulties with helping motivate this individual.  No was doing well with the pool therapy but patient is not a large fan of it.  We discussed other things such as balance towards a potential posterior stroke to help with strengthening of the musculature in avoiding risk of catastrophic fractures.  We discussed with patient about some of the different medications.  Has been seen her neurologist regularly and states that sometimes it seems that the balance acutely gets worse when taking the Sinemet initially but has to continue to take it though because she continues to have rigidity.  We discussed with patient to continue to monitor.  Do not want to make significant changes in the medications at the moment.  We did discuss different laboratory workup that could be helpful.  Patient would like to hold till she sees her primary care provider before she has these drawn.  Follow-up with me again in 2 months.  Continue the physical therapy for now.  Total time with patient as well as primary caregiver 31 minutes

## 2022-07-19 NOTE — Telephone Encounter (Signed)
Called [facility] and they understand  and will discuss at next appointment .

## 2022-07-19 NOTE — Patient Instructions (Signed)
Keep going with PT Look into [facility] next time with PCP: CK level, Vit D, B12, Folate See me in 2-3 months

## 2022-07-20 ENCOUNTER — Encounter (HOSPITAL_BASED_OUTPATIENT_CLINIC_OR_DEPARTMENT_OTHER): Payer: Self-pay | Admitting: Physical Therapy

## 2022-07-20 ENCOUNTER — Ambulatory Visit (HOSPITAL_BASED_OUTPATIENT_CLINIC_OR_DEPARTMENT_OTHER): Payer: Medicare Other | Admitting: Physical Therapy

## 2022-07-20 DIAGNOSIS — R2689 Other abnormalities of gait and mobility: Secondary | ICD-10-CM | POA: Diagnosis not present

## 2022-07-20 DIAGNOSIS — G20A1 Parkinson's disease without dyskinesia, without mention of fluctuations: Secondary | ICD-10-CM | POA: Diagnosis not present

## 2022-07-20 NOTE — Therapy (Signed)
OUTPATIENT PHYSICAL THERAPY LOWER EXTREMITY TREATMENT   Patient Name: Morgan [name] MRN: [id number] DOB:1951/07/25, 71 y.o., female Today's Date: 07/20/2022   PT End of Session - 07/20/22 1200     Visit Number 6    Number of Visits 16    Date for PT Re-Evaluation 08/10/22    PT Start Time 1200    PT Stop Time 1240    PT Time Calculation (min) 40 min    Activity Tolerance Patient tolerated treatment well    Behavior During Therapy WFL for tasks assessed/performed              Past Medical History:  Diagnosis Date   Anxiety    Fibromyalgia    Headache    Hypercholesteremia    controlled on medication   Hyperlipidemia    Hypertension    no current meds   LBBB (left bundle branch block)    Melanoma (HCC)    MVP (mitral valve prolapse)    Osteopenia    Parkinson disease    Pneumonia    SVD (spontaneous vaginal delivery)    x 2   Past Surgical History:  Procedure Laterality Date   BLEPHAROPLASTY     cold knife conization     COLONOSCOPY     FH colon CA   CYSTOSCOPY W/ URETERAL STENT PLACEMENT Left 10/18/2021   Procedure: CYSTOSCOPY WITH RETROGRADE PYELOGRAM, URETERAL STENT PLACEMENT;  Surgeon: Festus Aloe, MD;  Location: [facility];  Service: Urology;  Laterality: Left;   DILATATION & CURETTAGE/HYSTEROSCOPY WITH MYOSURE N/A 02/14/2018   Procedure: ATTEMPTED DILATATION & CURETTAGE/HYSTEROSCOPY WITH MYOSURE;  Surgeon: Christophe Louis, MD;  Location: [facility];  Service: Gynecology;  Laterality: N/A;  polyp   melanoma cancer     removed from left leg   RECONSTRUCTION OF EYELID     TONSILLECTOMY     TUBAL LIGATION     URETEROSCOPY WITH HOLMIUM LASER LITHOTRIPSY Left 12/17/2021   Procedure: URETEROSCOPY/HOLMIUM LASER/STENT EXCHANGE;  Surgeon: Festus Aloe, MD;  Location: [facility];  Service: Urology;  Laterality: Left;   WISDOM TOOTH EXTRACTION     Patient Active Problem List   Diagnosis Date Noted   COVID-19 virus infection 05/16/2022   HTN (hypertension)  10/20/2021   Physical deconditioning 10/20/2021   Hydronephrosis with infection 10/18/2021   Bacteremia due to Escherichia coli 10/18/2021   Severe sepsis ([place]) 10/17/2021   UTI (urinary tract infection) 54/65/6812   Acute metabolic encephalopathy 75/17/0017   Parkinson disease 10/17/2021   Cervical stenosis (uterine cervix) 02/14/2018   Postmenopausal bleeding 02/14/2018   LBBB (left bundle branch block) 12/25/2015   PVCs (premature ventricular contractions) 12/25/2015   Hyperlipidemia 12/25/2015   Fibromyalgia 01/16/2014   Cholelithiasis 04/25/2013   Paralysis agitans 01/15/2013   Unspecified hereditary and idiopathic peripheral neuropathy 01/15/2013   [name] MD PCP:   REFERRING PROVIDER: Hulan Saas MD   REFERRING DIAG: Parkinsons   THERAPY DIAG:  Other abnormalities of gait and mobility  Parkinson's disease without dyskinesia or fluctuating manifestations  Rationale for Evaluation and Treatment Rehabilitation  ONSET DATE: Several years   SUBJECTIVE:   SUBJECTIVE STATEMENT: Patient reports she feels the same as last session.  She is hoping to learn how to use some of the gym machines in a future session. She reports she doesn't like the water, but Dr wants her to continue longer.   From EVAL: The patient has had Parkinsons for many years. She has had land therapy with varying degrees of success. She has recently  had an onset of bilateral leg pain that comes and goes. The pain is mostly in the front but can go down to the back as well. She is a member of the gym. She has frequent falls.   PERTINENT HISTORY: Fibromyalgia; headaches, LBBB; osteopenia   PAIN:  Are you having pain? Yes: NPRS scale: 3/10 right now Pain location: down the front of her legs and and go to the back of calf's  (L>R) Pain description: sharp ache  Aggravating factors: just comes and goes  Relieving factors: just comes and goes; sometimes ibuprofin works but she is limited as to how  much she can take  PRECAUTIONS: Patient reports that because of her heart she will pass out at times.   WEIGHT BEARING RESTRICTIONS No  FALLS:  Has patient fallen in last 6 months? Yes. Number of falls 3-4 most recently slipped on something in the closet and fell face first    LIVING ENVIRONMENT: Stairs to the second floor in her house but she dosen't do much  4 steps in the front  2 steps in the garage  OCCUPATION: retired   Office manager: reading; goes to an exercise class   PLOF: Independent with household mobility with device  PATIENT GOALS  To have less pain in her legs and to move better    OBJECTIVE: * findings taken at evaluation, unless otherwise noted.   DIAGNOSTIC FINDINGS:   PATIENT SURVEYS:  FOTO    COGNITION:  Overall cognitive status: Within functional limits for tasks assessed     SENSATION: Denies paresthesias   POSTURE: No Significant postural limitations  PALPATION: No trigger points noted   LOWER EXTREMITY ROM:  Passive ROM Right eval Left eval  Hip flexion    Hip extension    Hip abduction    Hip adduction    Hip internal rotation    Hip external rotation    Knee flexion    Knee extension    Ankle dorsiflexion    Ankle plantarflexion    Ankle inversion    Ankle eversion     (Blank rows = not tested)  WNL  LOWER EXTREMITY MMT:  MMT Right eval Left eval  Hip flexion 21.7 29.7  Hip extension    Hip abduction 27.6 18.5  Hip adduction    Hip internal rotation    Hip external rotation    Knee flexion    Knee extension 23.5 20.2  Ankle dorsiflexion    Ankle plantarflexion    Ankle inversion    Ankle eversion     (Blank rows = not tested)  GAIT: Decreased hip flexion; leans to the left; right hip frop. Patient required CGA while walking and min a when she turned for balance.   Functional tests:  Sit to stand: uses's hands and needs several trials to stand from the table. Poor intial standing balance    Balance:   Narrow  base min a  Narrow base eyes closed min to mod a  Tandem stance Min a both ways.    TODAY'S TREATMENT:  11/22: Pt seen for aquatic therapy today.  Treatment took place in water 3.25-4.5 ft in depth at the [facility]. Temp of water was 92.  Pt entered/exited the pool via steps SBA with bilat hand rail.  WC transport to/from pool and to/from stairs at pool.   Therapist in water with patient due to decreased balance:  Holding white barbell:  forward / backward gait and side stepping, cues to slow speed  of legs for less shuffling. High knee marching.  Holding wall:  heel raises x 10; hip circles x 10 each LE; hip ext x 10 each LE; squats x 2 Back near wall:  holding rainbow hand floats - bilat shoulder addct/ abdct x 10; unilateral shoulder flex to/from ext (0-80) x 10; short blue noodle pull downs to LEs  Row with kickboard 1/2 submerged x 10;  weight shifts (side to side) with unilateral overhead reach then with unilateral cross body reach  Walking forward/ backward / side stepping with hands resting on kickboard floating on water (brief cues for increased step length + to slow speed) At bench with blue step under feet:  STS holding white barbell x 4 reps CGA/MinA due to posterior lean; then at request of pt - SBA with pt reaching arms overhead (limited ability to get to full standing / to control descent).    At bench without step:  cycling x 3 rounds;  hip abdct/ addct x 10;  STS reaching arms overhead x 5 with SBA and good balance.   Walking to stairs with SBA, no UE support   Pt requires the buoyancy and hydrostatic pressure of water for support, and to offload joints by unweighting joint load by at least 50 % in navel deep water and by at least 75-80% in chest to neck deep water.  Viscosity of the water is needed for resistance of strengthening. Water current perturbations provides challenge to standing balance requiring increased core activation.  Previous visit:  11/20 Therapist in water with patient due to decreased balance:  Holding wall:  heel raises 2x 10; hip abdct 2x 10 each (cues to keep Lt knee straight) ; hip extension 2x10 ; standing march 2x10  Holding white barbell:  forward  and side stepping, cues to slow speed of legs for less shuffling. Min guard .   Seated on bench:  cycling (2 rounds); alternating LAQ with DF x20 each .  Passive calf/hamstring stretch from therapist (limited tolerance) Standing back against  wall:  rainbow weights row x20; shoulder extension x20; quick extension x20  Holding  white barbell: Pt requires the buoyancy and hydrostatic pressure of water for support, and to offload joints by unweighting joint load by at least 50 % in navel deep water and by at least 75-80% in chest to neck deep water.  Viscosity of the water is needed for resistance of strengthening. Water current perturbations provides challenge to standing balance requiring increased core activation.   PATIENT EDUCATION:  Education details: safety with gait; aquatics progression  Person educated: Patient Education method: Explanation, Demonstration, Tactile cues, Verbal cues, and Handouts Education comprehension: verbalized understanding, returned demonstration, verbal cues required, tactile cues required, and needs further education   HOME EXERCISE PROGRAM:  has a program already; aquatic HEP TBD  ASSESSMENT:  CLINICAL IMPRESSION: Pool area busy upon start of pt's session; she appeared and reported that she was very distracted.  As more people exited the area, she was observed with improved focus and steadier movements. She requires minor cues to slow her movements to prevent shuffling (especially with backward gait).  SBA from therapist in the water; 1 episode of LOB at wall requiring CGA to steady.  She was more successful with sit to stand with feet on ground instead of on blue step in water.  She did not complain of increased pain in LEs during  session.  Pt requesting a land appt to learn more gym exercises for strengthening; spoke to  supervising PT and will switch next visit.  Goals are ongoing.    OBJECTIVE IMPAIRMENTS Abnormal gait, decreased activity tolerance, decreased balance, decreased mobility, difficulty walking, decreased strength, impaired tone, and pain.   ACTIVITY LIMITATIONS carrying, lifting, bending, standing, squatting, stairs, transfers, bed mobility, dressing, and locomotion level  PARTICIPATION LIMITATIONS: meal prep, cleaning, laundry, driving, shopping, community activity, and yard work  PERSONAL FACTORS Fibromyalgia; headaches, LBBB; osteopenia  are also affecting patient's functional outcome.   REHAB POTENTIAL: Good  CLINICAL DECISION MAKING: Evolving/moderate complexity increasing pain in her quads   EVALUATION COMPLEXITY: Moderate   GOALS: Goals reviewed with patient? Yes  SHORT TERM GOALS: Target date:  Patient will transfer sit to stand smoothly and independently  Baseline: Goal status: INITIAL  2.  Patient will increase gross strength by 5lbs  Baseline:  Goal status: INITIAL  3.  Patient will be independent with base pool program  Baseline:  Goal status: INITIAL LONG TERM GOALS: Target date:   Patient will go improve balance to CGA for all balance exercises to improve safety  Baseline:  Goal status: INITIAL  2.  Patient will reach FOTO goal to demonstrate improved function  Baseline:  Goal status: INITIAL  3.  Patient will have a full balance program  Baseline:  Goal status: INITIAL   PLAN: PT FREQUENCY: 2x/week  PT DURATION: 8 weeks  PLANNED INTERVENTIONS: Therapeutic exercises, Therapeutic activity, Neuromuscular re-education, Balance training, Gait training, Patient/Family education, Self Care, Joint mobilization, Stair training, Aquatic Therapy, Cryotherapy, Moist heat, Taping, Manual therapy, and Re-evaluation  PLAN FOR NEXT SESSION: continue with gait and balance  training in the pool. Be aware of bradycardia.    Kerin Perna, PTA 07/20/22 2:39 PM [facility] [address] [place], [place], [address] Phone: [phone]   Fax:  [phone]

## 2022-07-25 ENCOUNTER — Encounter (HOSPITAL_BASED_OUTPATIENT_CLINIC_OR_DEPARTMENT_OTHER): Payer: Self-pay | Admitting: Physical Therapy

## 2022-07-25 ENCOUNTER — Ambulatory Visit (HOSPITAL_BASED_OUTPATIENT_CLINIC_OR_DEPARTMENT_OTHER): Payer: Medicare Other | Admitting: Physical Therapy

## 2022-07-25 DIAGNOSIS — G20A1 Parkinson's disease without dyskinesia, without mention of fluctuations: Secondary | ICD-10-CM

## 2022-07-25 DIAGNOSIS — R2689 Other abnormalities of gait and mobility: Secondary | ICD-10-CM

## 2022-07-25 NOTE — Therapy (Signed)
OUTPATIENT PHYSICAL THERAPY LOWER EXTREMITY TREATMENT   Patient Name: Morgan [name] MRN: [id number] DOB:1950-11-04, 71 y.o., female Today's Date: 07/25/2022      Past Medical History:  Diagnosis Date   Anxiety    Fibromyalgia    Headache    Hypercholesteremia    controlled on medication   Hyperlipidemia    Hypertension    no current meds   LBBB (left bundle branch block)    Melanoma (HCC)    MVP (mitral valve prolapse)    Osteopenia    Parkinson disease    Pneumonia    SVD (spontaneous vaginal delivery)    x 2   Past Surgical History:  Procedure Laterality Date   BLEPHAROPLASTY     cold knife conization     COLONOSCOPY     FH colon CA   CYSTOSCOPY W/ URETERAL STENT PLACEMENT Left 10/18/2021   Procedure: CYSTOSCOPY WITH RETROGRADE PYELOGRAM, URETERAL STENT PLACEMENT;  Surgeon: Festus Aloe, MD;  Location: [facility];  Service: Urology;  Laterality: Left;   DILATATION & CURETTAGE/HYSTEROSCOPY WITH MYOSURE N/A 02/14/2018   Procedure: ATTEMPTED DILATATION & CURETTAGE/HYSTEROSCOPY WITH MYOSURE;  Surgeon: Christophe Louis, MD;  Location: [facility];  Service: Gynecology;  Laterality: N/A;  polyp   melanoma cancer     removed from left leg   RECONSTRUCTION OF EYELID     TONSILLECTOMY     TUBAL LIGATION     URETEROSCOPY WITH HOLMIUM LASER LITHOTRIPSY Left 12/17/2021   Procedure: URETEROSCOPY/HOLMIUM LASER/STENT EXCHANGE;  Surgeon: Festus Aloe, MD;  Location: [facility];  Service: Urology;  Laterality: Left;   WISDOM TOOTH EXTRACTION     Patient Active Problem List   Diagnosis Date Noted   COVID-19 virus infection 05/16/2022   HTN (hypertension) 10/20/2021   Physical deconditioning 10/20/2021   Hydronephrosis with infection 10/18/2021   Bacteremia due to Escherichia coli 10/18/2021   Severe sepsis ([place]) 10/17/2021   UTI (urinary tract infection) 09/62/8366   Acute metabolic encephalopathy 29/47/6546   Parkinson disease 10/17/2021   Cervical stenosis (uterine cervix)  02/14/2018   Postmenopausal bleeding 02/14/2018   LBBB (left bundle branch block) 12/25/2015   PVCs (premature ventricular contractions) 12/25/2015   Hyperlipidemia 12/25/2015   Fibromyalgia 01/16/2014   Cholelithiasis 04/25/2013   Paralysis agitans 01/15/2013   Unspecified hereditary and idiopathic peripheral neuropathy 01/15/2013   [name] MD PCP:   Loistine Chance PROVIDER: Hulan Saas MD   REFERRING DIAG: Parkinsons   THERAPY DIAG:  No diagnosis found.  Rationale for Evaluation and Treatment Rehabilitation  ONSET DATE: Several years   SUBJECTIVE:   SUBJECTIVE STATEMENT:  patient reports feeling a little "shaky "today.  Overall she reports she is feeling pretty good.  She is not having much pain in her legs today. From EVAL: The patient has had Parkinsons for many years. She has had land therapy with varying degrees of success. She has recently had an onset of bilateral leg pain that comes and goes. The pain is mostly in the front but can go down to the back as well. She is a member of the gym. She has frequent falls.   PERTINENT HISTORY: Fibromyalgia; headaches, LBBB; osteopenia   PAIN:  Are you having pain? Yes: NPRS scale: 3/10 right now Pain location: down the front of her legs and and go to the back of calf's  (L>R) Pain description: sharp ache  Aggravating factors: just comes and goes  Relieving factors: just comes and goes; sometimes ibuprofin works but she is limited as to how much  she can take  PRECAUTIONS: Patient reports that because of her heart she will pass out at times.   WEIGHT BEARING RESTRICTIONS No  FALLS:  Has patient fallen in last 6 months? Yes. Number of falls 3-4 most recently slipped on something in the closet and fell face first    LIVING ENVIRONMENT: Stairs to the second floor in her house but she dosen't do much  4 steps in the front  2 steps in the garage  OCCUPATION: retired   Office manager: reading; goes to an exercise class    PLOF: Independent with household mobility with device  PATIENT GOALS  To have less pain in her legs and to move better    OBJECTIVE: * findings taken at evaluation, unless otherwise noted.   DIAGNOSTIC FINDINGS:   PATIENT SURVEYS:  FOTO    COGNITION:  Overall cognitive status: Within functional limits for tasks assessed     SENSATION: Denies paresthesias   POSTURE: No Significant postural limitations  PALPATION: No trigger points noted   LOWER EXTREMITY ROM:  Passive ROM Right eval Left eval  Hip flexion    Hip extension    Hip abduction    Hip adduction    Hip internal rotation    Hip external rotation    Knee flexion    Knee extension    Ankle dorsiflexion    Ankle plantarflexion    Ankle inversion    Ankle eversion     (Blank rows = not tested)  WNL  LOWER EXTREMITY MMT:  MMT Right eval Left eval  Hip flexion 21.7 29.7  Hip extension    Hip abduction 27.6 18.5  Hip adduction    Hip internal rotation    Hip external rotation    Knee flexion    Knee extension 23.5 20.2  Ankle dorsiflexion    Ankle plantarflexion    Ankle inversion    Ankle eversion     (Blank rows = not tested)  GAIT: Decreased hip flexion; leans to the left; right hip frop. Patient required CGA while walking and min a when she turned for balance.   Functional tests:  Sit to stand: uses's hands and needs several trials to stand from the table. Poor intial standing balance    Balance:   Narrow base min a  Narrow base eyes closed min to mod a  Tandem stance Min a both ways.    TODAY'S TREATMENT: 11/27 Introduction to land equipment;  NuStep: Level 2 seat 6 handles setting 9 5 minutes steps per minute between 80 and 100.  Patient became fatigued quickly at 100.  She was advised to keep steps around 80/min.  X-fit: Level 1 5 minutes mod assist to get on and off the equipment  Recumbent bike: Level 1 5 minutes contact-guard to get on and off the equipment  Leg  press seat set to 9090 position.  Reviewed how to change sled position.  10 pounds 2 x 15 min assist to get on and off   11/22: Pt seen for aquatic therapy today.  Treatment took place in water 3.25-4.5 ft in depth at the [facility]. Temp of water was 92.  Pt entered/exited the pool via steps SBA with bilat hand rail.  WC transport to/from pool and to/from stairs at pool.   Therapist in water with patient due to decreased balance:  Holding white barbell:  forward / backward gait and side stepping, cues to slow speed of legs for less shuffling. High knee marching.  Holding wall:  heel raises x 10; hip circles x 10 each LE; hip ext x 10 each LE; squats x 2 Back near wall:  holding rainbow hand floats - bilat shoulder addct/ abdct x 10; unilateral shoulder flex to/from ext (0-80) x 10; short blue noodle pull downs to LEs  Row with kickboard 1/2 submerged x 10;  weight shifts (side to side) with unilateral overhead reach then with unilateral cross body reach  Walking forward/ backward / side stepping with hands resting on kickboard floating on water (brief cues for increased step length + to slow speed) At bench with blue step under feet:  STS holding white barbell x 4 reps CGA/MinA due to posterior lean; then at request of pt - SBA with pt reaching arms overhead (limited ability to get to full standing / to control descent).    At bench without step:  cycling x 3 rounds;  hip abdct/ addct x 10;  STS reaching arms overhead x 5 with SBA and good balance.   Walking to stairs with SBA, no UE support   Pt requires the buoyancy and hydrostatic pressure of water for support, and to offload joints by unweighting joint load by at least 50 % in navel deep water and by at least 75-80% in chest to neck deep water.  Viscosity of the water is needed for resistance of strengthening. Water current perturbations provides challenge to standing balance requiring increased core activation.  Previous  visit: 11/20 Therapist in water with patient due to decreased balance:  Holding wall:  heel raises 2x 10; hip abdct 2x 10 each (cues to keep Lt knee straight) ; hip extension 2x10 ; standing march 2x10  Holding white barbell:  forward  and side stepping, cues to slow speed of legs for less shuffling. Min guard .   Seated on bench:  cycling (2 rounds); alternating LAQ with DF x20 each .  Passive calf/hamstring stretch from therapist (limited tolerance) Standing back against  wall:  rainbow weights row x20; shoulder extension x20; quick extension x20  Holding  white barbell: Pt requires the buoyancy and hydrostatic pressure of water for support, and to offload joints by unweighting joint load by at least 50 % in navel deep water and by at least 75-80% in chest to neck deep water.  Viscosity of the water is needed for resistance of strengthening. Water current perturbations provides challenge to standing balance requiring increased core activation.   PATIENT EDUCATION:  Education details: safety with gait; aquatics progression  Person educated: Patient Education method: Explanation, Demonstration, Tactile cues, Verbal cues, and Handouts Education comprehension: verbalized understanding, returned demonstration, verbal cues required, tactile cues required, and needs further education   HOME EXERCISE PROGRAM:  has a program already; aquatic HEP TBD  ASSESSMENT:  CLINICAL IMPRESSION: Patient performed land activity today.  We were able to trial X fit, NuStep, and recumbent exercise bike.  She tolerated all 3 well.  She did the best getting on and off of the NuStep.  She had more difficulty getting on and off the X-fit.  We also trialed the leg press.  She required min assist to get on and off the leg press safely.  We talked to the patient's husband about assisting her on and off of the equipment.  He will use the settings that we used today to get her on and off the NuStep hopefully tomorrow.   Patient only performed a 30-minute treatment today but she was fatigued following treatment.  A 45-minute  appointment is not required at this time secondary to endurance. OBJECTIVE IMPAIRMENTS Abnormal gait, decreased activity tolerance, decreased balance, decreased mobility, difficulty walking, decreased strength, impaired tone, and pain.   ACTIVITY LIMITATIONS carrying, lifting, bending, standing, squatting, stairs, transfers, bed mobility, dressing, and locomotion level  PARTICIPATION LIMITATIONS: meal prep, cleaning, laundry, driving, shopping, community activity, and yard work  PERSONAL FACTORS Fibromyalgia; headaches, LBBB; osteopenia  are also affecting patient's functional outcome.   REHAB POTENTIAL: Good  CLINICAL DECISION MAKING: Evolving/moderate complexity increasing pain in her quads   EVALUATION COMPLEXITY: Moderate   GOALS: Goals reviewed with patient? Yes  SHORT TERM GOALS: Target date:  Patient will transfer sit to stand smoothly and independently  Baseline: Goal status: INITIAL  2.  Patient will increase gross strength by 5lbs  Baseline:  Goal status: INITIAL  3.  Patient will be independent with base pool program  Baseline:  Goal status: INITIAL LONG TERM GOALS: Target date:   Patient will go improve balance to CGA for all balance exercises to improve safety  Baseline:  Goal status: INITIAL  2.  Patient will reach FOTO goal to demonstrate improved function  Baseline:  Goal status: INITIAL  3.  Patient will have a full balance program  Baseline:  Goal status: INITIAL   PLAN: PT FREQUENCY: 2x/week  PT DURATION: 8 weeks  PLANNED INTERVENTIONS: Therapeutic exercises, Therapeutic activity, Neuromuscular re-education, Balance training, Gait training, Patient/Family education, Self Care, Joint mobilization, Stair training, Aquatic Therapy, Cryotherapy, Moist heat, Taping, Manual therapy, and Re-evaluation  PLAN FOR NEXT SESSION: continue with gait and  balance training in the pool. Be aware of bradycardia.  Continue with combination of land and pool exercises.   Carolyne Littles PT DPT  07/25/22 10:50 AM [facility] [address] [place], [place], [address] Phone: [phone]   Fax:  [phone]

## 2022-07-26 ENCOUNTER — Telehealth: Payer: Self-pay

## 2022-07-26 NOTE — Patient Outreach (Signed)
  Care Coordination   07/26/2022 Name: Morgan [name] MRN: [id number] DOB: 12-Feb-1951   Care Coordination Outreach Attempts:  A second unsuccessful outreach was attempted today to offer the patient with information about available care coordination services as a benefit of their health plan.     Follow Up Plan:  Additional outreach attempts will be made to offer the patient care coordination information and services.   Encounter Outcome:  No Answer   Care Coordination Interventions:  No, not indicated    Jone Baseman, RN, MSN [facility] Care Management Coordinator Direct Line [phone]

## 2022-07-28 ENCOUNTER — Ambulatory Visit (HOSPITAL_BASED_OUTPATIENT_CLINIC_OR_DEPARTMENT_OTHER): Payer: Medicare Other | Admitting: Physical Therapy

## 2022-08-01 ENCOUNTER — Ambulatory Visit (HOSPITAL_BASED_OUTPATIENT_CLINIC_OR_DEPARTMENT_OTHER): Payer: Medicare Other | Attending: Family Medicine | Admitting: Physical Therapy

## 2022-08-01 ENCOUNTER — Encounter: Payer: Self-pay | Admitting: Neurology

## 2022-08-01 ENCOUNTER — Encounter (HOSPITAL_BASED_OUTPATIENT_CLINIC_OR_DEPARTMENT_OTHER): Payer: Self-pay | Admitting: Physical Therapy

## 2022-08-01 DIAGNOSIS — G20A1 Parkinson's disease without dyskinesia, without mention of fluctuations: Secondary | ICD-10-CM | POA: Insufficient documentation

## 2022-08-01 DIAGNOSIS — R2689 Other abnormalities of gait and mobility: Secondary | ICD-10-CM | POA: Insufficient documentation

## 2022-08-01 NOTE — Therapy (Signed)
OUTPATIENT PHYSICAL THERAPY LOWER EXTREMITY TREATMENT   Patient Name: Morgan [name] MRN: [id number] DOB:1951/06/05, 71 y.o., female Today's Date: 08/01/2022   PT End of Session - 08/01/22 1205     Visit Number 8    Number of Visits 16    Date for PT Re-Evaluation 08/10/22    PT Start Time 1118    PT Stop Time 6269    PT Time Calculation (min) 38 min    Activity Tolerance Patient tolerated treatment well    Behavior During Therapy WFL for tasks assessed/performed               Past Medical History:  Diagnosis Date   Anxiety    Fibromyalgia    Headache    Hypercholesteremia    controlled on medication   Hyperlipidemia    Hypertension    no current meds   LBBB (left bundle branch block)    Melanoma (HCC)    MVP (mitral valve prolapse)    Osteopenia    Parkinson disease    Pneumonia    SVD (spontaneous vaginal delivery)    x 2   Past Surgical History:  Procedure Laterality Date   BLEPHAROPLASTY     cold knife conization     COLONOSCOPY     FH colon CA   CYSTOSCOPY W/ URETERAL STENT PLACEMENT Left 10/18/2021   Procedure: CYSTOSCOPY WITH RETROGRADE PYELOGRAM, URETERAL STENT PLACEMENT;  Surgeon: Festus Aloe, MD;  Location: [facility];  Service: Urology;  Laterality: Left;   DILATATION & CURETTAGE/HYSTEROSCOPY WITH MYOSURE N/A 02/14/2018   Procedure: ATTEMPTED DILATATION & CURETTAGE/HYSTEROSCOPY WITH MYOSURE;  Surgeon: Christophe Louis, MD;  Location: [facility];  Service: Gynecology;  Laterality: N/A;  polyp   melanoma cancer     removed from left leg   RECONSTRUCTION OF EYELID     TONSILLECTOMY     TUBAL LIGATION     URETEROSCOPY WITH HOLMIUM LASER LITHOTRIPSY Left 12/17/2021   Procedure: URETEROSCOPY/HOLMIUM LASER/STENT EXCHANGE;  Surgeon: Festus Aloe, MD;  Location: [facility];  Service: Urology;  Laterality: Left;   WISDOM TOOTH EXTRACTION     Patient Active Problem List   Diagnosis Date Noted   COVID-19 virus infection 05/16/2022   HTN (hypertension)  10/20/2021   Physical deconditioning 10/20/2021   Hydronephrosis with infection 10/18/2021   Bacteremia due to Escherichia coli 10/18/2021   Severe sepsis ([place]) 10/17/2021   UTI (urinary tract infection) 48/54/6270   Acute metabolic encephalopathy 35/00/9381   Parkinson disease 10/17/2021   Cervical stenosis (uterine cervix) 02/14/2018   Postmenopausal bleeding 02/14/2018   LBBB (left bundle branch block) 12/25/2015   PVCs (premature ventricular contractions) 12/25/2015   Hyperlipidemia 12/25/2015   Fibromyalgia 01/16/2014   Cholelithiasis 04/25/2013   Paralysis agitans 01/15/2013   Unspecified hereditary and idiopathic peripheral neuropathy 01/15/2013   [name] MD PCP:   Loistine Chance PROVIDER: Hulan Saas MD   REFERRING DIAG: Parkinsons   THERAPY DIAG:  Other abnormalities of gait and mobility  Parkinson's disease without dyskinesia or fluctuating manifestations  Rationale for Evaluation and Treatment Rehabilitation  ONSET DATE: Several years   SUBJECTIVE:   SUBJECTIVE STATEMENT:  Pt and husband report that she was late taking morning meds this morning, so she is unsure how today's appt will go.  She reports that she was sore after land appt, but that soreness resolved.  She had another fall since last visit; pt's husband shows large dark bruise on pt's Rt hip that he has placed kinesiology tape over.  From EVAL: The patient has had Parkinsons for many years. She has had land therapy with varying degrees of success. She has recently had an onset of bilateral leg pain that comes and goes. The pain is mostly in the front but can go down to the back as well. She is a member of the gym. She has frequent falls.   PERTINENT HISTORY: Fibromyalgia; headaches, LBBB; osteopenia   PAIN:  Are you having pain? Yes: NPRS scale: 3/10 right now Pain location: down the front of her legs and and go to the back of calf's  (L>R) Pain description: sharp ache  Aggravating factors:  just comes and goes  Relieving factors: just comes and goes; sometimes ibuprofin works but she is limited as to how much she can take  PRECAUTIONS: Patient reports that because of her heart she will pass out at times.   WEIGHT BEARING RESTRICTIONS No  FALLS:  Has patient fallen in last 6 months? Yes. Number of falls 3-4 most recently slipped on something in the closet and fell face first    LIVING ENVIRONMENT: Stairs to the second floor in her house but she dosen't do much  4 steps in the front  2 steps in the garage  OCCUPATION: retired   Office manager: reading; goes to an exercise class   PLOF: Independent with household mobility with device  PATIENT GOALS  To have less pain in her legs and to move better    OBJECTIVE: * findings taken at evaluation, unless otherwise noted.   DIAGNOSTIC FINDINGS:   PATIENT SURVEYS:  FOTO    COGNITION:  Overall cognitive status: Within functional limits for tasks assessed     SENSATION: Denies paresthesias   POSTURE: No Significant postural limitations  PALPATION: No trigger points noted   LOWER EXTREMITY ROM:  Passive ROM Right eval Left eval  Hip flexion    Hip extension    Hip abduction    Hip adduction    Hip internal rotation    Hip external rotation    Knee flexion    Knee extension    Ankle dorsiflexion    Ankle plantarflexion    Ankle inversion    Ankle eversion     (Blank rows = not tested)  WNL  LOWER EXTREMITY MMT:  MMT Right eval Left eval  Hip flexion 21.7 29.7  Hip extension    Hip abduction 27.6 18.5  Hip adduction    Hip internal rotation    Hip external rotation    Knee flexion    Knee extension 23.5 20.2  Ankle dorsiflexion    Ankle plantarflexion    Ankle inversion    Ankle eversion     (Blank rows = not tested)  GAIT: Decreased hip flexion; leans to the left; right hip frop. Patient required CGA while walking and min a when she turned for balance.   Functional tests:  Sit to stand:  uses's hands and needs several trials to stand from the table. Poor intial standing balance    Balance:   Narrow base min a  Narrow base eyes closed min to mod a  Tandem stance Min a both ways.    TODAY'S TREATMENT: 12/4: Pt seen for aquatic therapy today.  Treatment took place in water 3.25-4.5 ft in depth at the [facility]. Temp of water was 92.  Pt entered/exited the pool via steps SBA with bilat hand rail.  WC transport to/from pool and to/from stairs at pool.   Therapist in water  with patient due to decreased balance (SBA/Supervision):  Without support:  forward / backward gait and side stepping, cues to slow speed and for increased step length With unilateral resistance bell and staggered stance - forward/ backward weight shift (like vacumming) Without UE support:  3 way step with return to neutral after each direction (forward/ side/back) x 5 each High knee marching holding rainbow hand floats; marching forward/ backward with row motion of arms on blue noodle Side stepping with shoulder abdct/addct with rainbow hand floats R/L Tandem stance with hands on rainbow hand floats:  unilateral and bilat shoulder horiz abdct Wide stance with bilat shoulder flex/ext (0-90) and bilat shoulder abdct/add (0-09) with rainbow hand floats Perched on bench:  TUG like exercise working on speed with cues for big steps and upright posture Wall push up/off x 10 using core and ankle strategy;  back against wall with hip strategy pulling back off of wall Seated on bench with feet on blue step:   STS with cues for slow controlled motion, some reps raising arms overhead if able(challenge) Gait drills working on stopping and starting in all directions, varying directions and speeds  Pt requires the buoyancy and hydrostatic pressure of water for support, and to offload joints by unweighting joint load by at least 50 % in navel deep water and by at least 75-80% in chest to neck deep water.   Viscosity of the water is needed for resistance of strengthening. Water current perturbations provides challenge to standing balance requiring increased core activation.  11/27 Introduction to land equipment;  NuStep: Level 2 seat 6 handles setting 9 5 minutes steps per minute between 80 and 100.  Patient became fatigued quickly at 100.  She was advised to keep steps around 80/min.  X-fit: Level 1 5 minutes mod assist to get on and off the equipment  Recumbent bike: Level 1 5 minutes contact-guard to get on and off the equipment  Leg press seat set to 9090 position.  Reviewed how to change sled position.  10 pounds 2 x 15 min assist to get on and off   11/22: Pt seen for aquatic therapy today.  Treatment took place in water 3.25-4.5 ft in depth at the [facility]. Temp of water was 92.  Pt entered/exited the pool via steps SBA with bilat hand rail.  WC transport to/from pool and to/from stairs at pool.   Therapist in water with patient due to decreased balance:  Holding white barbell:  forward / backward gait and side stepping, cues to slow speed of legs for less shuffling. High knee marching.  Holding wall:  heel raises x 10; hip circles x 10 each LE; hip ext x 10 each LE; squats x 2 Back near wall:  holding rainbow hand floats - bilat shoulder addct/ abdct x 10; unilateral shoulder flex to/from ext (0-80) x 10; short blue noodle pull downs to LEs  Row with kickboard 1/2 submerged x 10;  weight shifts (side to side) with unilateral overhead reach then with unilateral cross body reach  Walking forward/ backward / side stepping with hands resting on kickboard floating on water (brief cues for increased step length + to slow speed) At bench with blue step under feet:  STS holding white barbell x 4 reps CGA/MinA due to posterior lean; then at request of pt - SBA with pt reaching arms overhead (limited ability to get to full standing / to control descent).    At bench without  step:  cycling  x 3 rounds;  hip abdct/ addct x 10;  STS reaching arms overhead x 5 with SBA and good balance.   Walking to stairs with SBA, no UE support  Back against wall - single knee to chest stretch x 2 each  Pt requires the buoyancy and hydrostatic pressure of water for support, and to offload joints by unweighting joint load by at least 50 % in navel deep water and by at least 75-80% in chest to neck deep water.  Viscosity of the water is needed for resistance of strengthening. Water current perturbations provides challenge to standing balance requiring increased core activation.   PATIENT EDUCATION:  Education details: safety with gait; aquatics progression  Person educated: Patient Education method: Explanation, Demonstration, Tactile cues, Verbal cues, and Handouts Education comprehension: verbalized understanding, returned demonstration, verbal cues required, tactile cues required, and needs further education   HOME EXERCISE PROGRAM:  has a program already; aquatic HEP TBD  ASSESSMENT:  CLINICAL IMPRESSION: Pt able to walk and exercise in water without UE support, moving in all directions without overt loss of balance.  Varied exercises working on weight shifts/ returning to neutral, gait speed with increased step length, reactive balance for stops and starts.   The pool environment was quiet today with only one other therapist/ pt providing less distraction during session.  She reported some increase in low back discomfort at end of session; reduced with knee to chest stretch.  Pt is progressing towards goals.    OBJECTIVE IMPAIRMENTS Abnormal gait, decreased activity tolerance, decreased balance, decreased mobility, difficulty walking, decreased strength, impaired tone, and pain.   ACTIVITY LIMITATIONS carrying, lifting, bending, standing, squatting, stairs, transfers, bed mobility, dressing, and locomotion level  PARTICIPATION LIMITATIONS: meal prep, cleaning, laundry,  driving, shopping, community activity, and yard work  PERSONAL FACTORS Fibromyalgia; headaches, LBBB; osteopenia  are also affecting patient's functional outcome.   REHAB POTENTIAL: Good  CLINICAL DECISION MAKING: Evolving/moderate complexity increasing pain in her quads   EVALUATION COMPLEXITY: Moderate   GOALS: Goals reviewed with patient? Yes  SHORT TERM GOALS: Target date:  Patient will transfer sit to stand smoothly and independently  Baseline: Goal status: INITIAL  2.  Patient will increase gross strength by 5lbs  Baseline:  Goal status: INITIAL  3.  Patient will be independent with base pool program  Baseline:  Goal status: INITIAL LONG TERM GOALS: Target date:   Patient will go improve balance to CGA for all balance exercises to improve safety  Baseline:  Goal status: INITIAL  2.  Patient will reach FOTO goal to demonstrate improved function  Baseline:  Goal status: INITIAL  3.  Patient will have a full balance program  Baseline:  Goal status: INITIAL   PLAN: PT FREQUENCY: 2x/week  PT DURATION: 8 weeks  PLANNED INTERVENTIONS: Therapeutic exercises, Therapeutic activity, Neuromuscular re-education, Balance training, Gait training, Patient/Family education, Self Care, Joint mobilization, Stair training, Aquatic Therapy, Cryotherapy, Moist heat, Taping, Manual therapy, and Re-evaluation  PLAN FOR NEXT SESSION: continue with gait and balance training in the pool. Be aware of bradycardia.  Continue with combination of land and pool exercises.   Kerin Perna, PTA 08/01/22 12:23 PM [facility] [address] [place], [place], [address] Phone: [phone]   Fax:  [phone]

## 2022-08-04 ENCOUNTER — Ambulatory Visit (HOSPITAL_BASED_OUTPATIENT_CLINIC_OR_DEPARTMENT_OTHER): Payer: Medicare Other | Admitting: Physical Therapy

## 2022-08-04 ENCOUNTER — Other Ambulatory Visit: Payer: Self-pay | Admitting: Neurology

## 2022-08-04 DIAGNOSIS — R2689 Other abnormalities of gait and mobility: Secondary | ICD-10-CM | POA: Diagnosis not present

## 2022-08-04 DIAGNOSIS — G20A1 Parkinson's disease without dyskinesia, without mention of fluctuations: Secondary | ICD-10-CM

## 2022-08-04 DIAGNOSIS — G20B1 Parkinson's disease with dyskinesia, without mention of fluctuations: Secondary | ICD-10-CM

## 2022-08-04 NOTE — Therapy (Signed)
OUTPATIENT PHYSICAL THERAPY LOWER EXTREMITY TREATMENT   Patient Name: Morgan [name] MRN: [id number] DOB:1951-08-13, 71 y.o., female Today's Date: 08/04/2022   PT End of Session - 08/04/22 1121     Visit Number 9    Number of Visits 16    Date for PT Re-Evaluation 08/10/22    PT Start Time 1117    PT Stop Time 1148   pt requested to end session early   PT Time Calculation (min) 31 min    Activity Tolerance Patient tolerated treatment well    Behavior During Therapy WFL for tasks assessed/performed               Past Medical History:  Diagnosis Date   Anxiety    Fibromyalgia    Headache    Hypercholesteremia    controlled on medication   Hyperlipidemia    Hypertension    no current meds   LBBB (left bundle branch block)    Melanoma (HCC)    MVP (mitral valve prolapse)    Osteopenia    Parkinson disease    Pneumonia    SVD (spontaneous vaginal delivery)    x 2   Past Surgical History:  Procedure Laterality Date   BLEPHAROPLASTY     cold knife conization     COLONOSCOPY     FH colon CA   CYSTOSCOPY W/ URETERAL STENT PLACEMENT Left 10/18/2021   Procedure: CYSTOSCOPY WITH RETROGRADE PYELOGRAM, URETERAL STENT PLACEMENT;  Surgeon: Festus Aloe, MD;  Location: [facility];  Service: Urology;  Laterality: Left;   DILATATION & CURETTAGE/HYSTEROSCOPY WITH MYOSURE N/A 02/14/2018   Procedure: ATTEMPTED DILATATION & CURETTAGE/HYSTEROSCOPY WITH MYOSURE;  Surgeon: Christophe Louis, MD;  Location: [facility];  Service: Gynecology;  Laterality: N/A;  polyp   melanoma cancer     removed from left leg   RECONSTRUCTION OF EYELID     TONSILLECTOMY     TUBAL LIGATION     URETEROSCOPY WITH HOLMIUM LASER LITHOTRIPSY Left 12/17/2021   Procedure: URETEROSCOPY/HOLMIUM LASER/STENT EXCHANGE;  Surgeon: Festus Aloe, MD;  Location: [facility];  Service: Urology;  Laterality: Left;   WISDOM TOOTH EXTRACTION     Patient Active Problem List   Diagnosis Date Noted   COVID-19 virus infection  05/16/2022   HTN (hypertension) 10/20/2021   Physical deconditioning 10/20/2021   Hydronephrosis with infection 10/18/2021   Bacteremia due to Escherichia coli 10/18/2021   Severe sepsis ([place]) 10/17/2021   UTI (urinary tract infection) 36/64/4034   Acute metabolic encephalopathy 74/25/9563   Parkinson disease 10/17/2021   Cervical stenosis (uterine cervix) 02/14/2018   Postmenopausal bleeding 02/14/2018   LBBB (left bundle branch block) 12/25/2015   PVCs (premature ventricular contractions) 12/25/2015   Hyperlipidemia 12/25/2015   Fibromyalgia 01/16/2014   Cholelithiasis 04/25/2013   Paralysis agitans 01/15/2013   Unspecified hereditary and idiopathic peripheral neuropathy 01/15/2013   [name] MD PCP:   Loistine Chance PROVIDER: Hulan Saas MD   REFERRING DIAG: Parkinsons   THERAPY DIAG:  No diagnosis found.  Rationale for Evaluation and Treatment Rehabilitation  ONSET DATE: Several years   SUBJECTIVE:   SUBJECTIVE STATEMENT:  Pt reports she wasn't as sore after last session as she was the time before.     From EVAL: The patient has had Parkinsons for many years. She has had land therapy with varying degrees of success. She has recently had an onset of bilateral leg pain that comes and goes. The pain is mostly in the front but can go down to the back as  well. She is a member of the gym. She has frequent falls.   PERTINENT HISTORY: Fibromyalgia; headaches, LBBB; osteopenia   PAIN:  Are you having pain? no: NPRS scale: 0/10 right now Pain location:  Pain description:  Aggravating factors: just comes and goes  Relieving factors: just comes and goes; sometimes ibuprofin works but she is limited as to how much she can take  PRECAUTIONS: Patient reports that because of her heart she will pass out at times.   WEIGHT BEARING RESTRICTIONS No  FALLS:  Has patient fallen in last 6 months? Yes. Number of falls 3-4 most recently slipped on something in the closet and  fell face first    LIVING ENVIRONMENT: Stairs to the second floor in her house but she dosen't do much  4 steps in the front  2 steps in the garage  OCCUPATION: retired   Office manager: reading; goes to an exercise class   PLOF: Independent with household mobility with device  PATIENT GOALS  To have less pain in her legs and to move better    OBJECTIVE: * findings taken at evaluation, unless otherwise noted.   DIAGNOSTIC FINDINGS:   PATIENT SURVEYS:  FOTO    COGNITION:  Overall cognitive status: Within functional limits for tasks assessed     SENSATION: Denies paresthesias   POSTURE: No Significant postural limitations  PALPATION: No trigger points noted   LOWER EXTREMITY ROM:  Passive ROM Right eval Left eval  Hip flexion    Hip extension    Hip abduction    Hip adduction    Hip internal rotation    Hip external rotation    Knee flexion    Knee extension    Ankle dorsiflexion    Ankle plantarflexion    Ankle inversion    Ankle eversion     (Blank rows = not tested)  WNL  LOWER EXTREMITY MMT:  MMT Right eval Left eval  Hip flexion 21.7 29.7  Hip extension    Hip abduction 27.6 18.5  Hip adduction    Hip internal rotation    Hip external rotation    Knee flexion    Knee extension 23.5 20.2  Ankle dorsiflexion    Ankle plantarflexion    Ankle inversion    Ankle eversion     (Blank rows = not tested)  GAIT: Decreased hip flexion; leans to the left; right hip frop. Patient required CGA while walking and min a when she turned for balance.   Functional tests:  Sit to stand: uses's hands and needs several trials to stand from the table. Poor intial standing balance    Balance:   Narrow base min a  Narrow base eyes closed min to mod a  Tandem stance Min a both ways.    TODAY'S TREATMENT:  12/4: Pt seen for aquatic therapy today.  Treatment took place in water 3.25-4.5 ft in depth at the [facility]. Temp of water was 92.  Pt  entered/exited the pool via steps SBA with bilat hand rail.  WC transport to/from pool and to/from stairs at pool.   Therapist providing instruction and supervision while on deck * With UE on black barbell:  forward/back and side stepping;marching high knee forward/ backward - cues to slow speed and for increased step length * Without support:  forward / backward gait * With unilateral resistance bell and staggered stance - forward/ backward weight shift (like vacumming) x 10, side lunge and return to neutral with push/pull of resistance  bell x 10 each * Without UE support:  3 way step with return to neutral after each direction (forward/ side/back) x 5 each - demo and cues for improved performance * Side step with shoulder addct with rainbow hand floats (difficulty moving R without shuffling)  * Perched on bench:  TUG like exercise working on speed with cues for big steps and upright posture x 4 * STS on 3rd step from bottom with use of UE for ascending / intermittent for descending (4 sec eccentric lowering) and gaining balance once upright without UE support x 7 reps  *  Wall push up/off x 10 using core and ankle strategy * Gait drills working on stopping and starting in all directions, varying directions and speeds * back against wall and knee to chest stretch x 10s each  Pt requires the buoyancy and hydrostatic pressure of water for support, and to offload joints by unweighting joint load by at least 50 % in navel deep water and by at least 75-80% in chest to neck deep water.  Viscosity of the water is needed for resistance of strengthening. Water current perturbations provides challenge to standing balance requiring increased core activation.   12/4: Pt seen for aquatic therapy today.  Treatment took place in water 3.25-4.5 ft in depth at the [facility]. Temp of water was 92.  Pt entered/exited the pool via steps SBA with bilat hand rail.  WC transport to/from pool and to/from  stairs at pool.   Therapist in water with patient due to decreased balance (SBA/Supervision):  Without support:  forward / backward gait and side stepping, cues to slow speed and for increased step length With unilateral resistance bell and staggered stance - forward/ backward weight shift (like vacumming) Without UE support:  3 way step with return to neutral after each direction (forward/ side/back) x 5 each High knee marching holding rainbow hand floats; marching forward/ backward with row motion of arms on blue noodle Side stepping with shoulder abdct/addct with rainbow hand floats R/L Tandem stance with hands on rainbow hand floats:  unilateral and bilat shoulder horiz abdct Wide stance with bilat shoulder flex/ext (0-90) and bilat shoulder abdct/add (0-09) with rainbow hand floats Perched on bench:  TUG like exercise working on speed with cues for big steps and upright posture Wall push up/off x 10 using core and ankle strategy;  back against wall with hip strategy pulling back off of wall Seated on bench with feet on blue step:   STS with cues for slow controlled motion, some reps raising arms overhead if able(challenge) Gait drills working on stopping and starting in all directions, varying directions and speeds  Pt requires the buoyancy and hydrostatic pressure of water for support, and to offload joints by unweighting joint load by at least 50 % in navel deep water and by at least 75-80% in chest to neck deep water.  Viscosity of the water is needed for resistance of strengthening. Water current perturbations provides challenge to standing balance requiring increased core activation.  11/27 Introduction to land equipment;  NuStep: Level 2 seat 6 handles setting 9 5 minutes steps per minute between 80 and 100.  Patient became fatigued quickly at 100.  She was advised to keep steps around 80/min.  X-fit: Level 1 5 minutes mod assist to get on and off the equipment  Recumbent bike:  Level 1 5 minutes contact-guard to get on and off the equipment  Leg press seat set to 9090 position.  Reviewed how to change sled position.  10 pounds 2 x 15 min assist to get on and off   11/22: Pt seen for aquatic therapy today.  Treatment took place in water 3.25-4.5 ft in depth at the [facility]. Temp of water was 92.  Pt entered/exited the pool via steps SBA with bilat hand rail.  WC transport to/from pool and to/from stairs at pool.   Therapist in water with patient due to decreased balance:  Holding white barbell:  forward / backward gait and side stepping, cues to slow speed of legs for less shuffling. High knee marching.  Holding wall:  heel raises x 10; hip circles x 10 each LE; hip ext x 10 each LE; squats x 2 Back near wall:  holding rainbow hand floats - bilat shoulder addct/ abdct x 10; unilateral shoulder flex to/from ext (0-80) x 10; short blue noodle pull downs to LEs  Row with kickboard 1/2 submerged x 10;  weight shifts (side to side) with unilateral overhead reach then with unilateral cross body reach  Walking forward/ backward / side stepping with hands resting on kickboard floating on water (brief cues for increased step length + to slow speed) At bench with blue step under feet:  STS holding white barbell x 4 reps CGA/MinA due to posterior lean; then at request of pt - SBA with pt reaching arms overhead (limited ability to get to full standing / to control descent).    At bench without step:  cycling x 3 rounds;  hip abdct/ addct x 10;  STS reaching arms overhead x 5 with SBA and good balance.   Walking to stairs with SBA, no UE support  Back against wall - single knee to chest stretch x 2 each  Pt requires the buoyancy and hydrostatic pressure of water for support, and to offload joints by unweighting joint load by at least 50 % in navel deep water and by at least 75-80% in chest to neck deep water.  Viscosity of the water is needed for resistance of  strengthening. Water current perturbations provides challenge to standing balance requiring increased core activation.   PATIENT EDUCATION:  Education details: safety with gait; aquatics progression  Person educated: Patient Education method: Explanation, Demonstration, Tactile cues, Verbal cues, and Handouts Education comprehension: verbalized understanding, returned demonstration, verbal cues required, tactile cues required, and needs further education   HOME EXERCISE PROGRAM:  has a program already; aquatic HEP TBD  ASSESSMENT:  CLINICAL IMPRESSION: Pt able to exercise in water with therapist providing cues, instruction and supervision from deck, however pt voiced that she would like therapist in water at next appt.  Continued work on weight shifts/ returning to neutral, gait speed with increased step length, reactive balance for stops and starts.   The pool environment was quiet today with only one other therapist/ pt providing less distraction during session.  She reported some increase in low back discomfort at end of session; reduced with knee to chest stretch. Pt requested to finish early (31 min) due to fatigue. Pt is progressing towards goals.    OBJECTIVE IMPAIRMENTS Abnormal gait, decreased activity tolerance, decreased balance, decreased mobility, difficulty walking, decreased strength, impaired tone, and pain.   ACTIVITY LIMITATIONS carrying, lifting, bending, standing, squatting, stairs, transfers, bed mobility, dressing, and locomotion level  PARTICIPATION LIMITATIONS: meal prep, cleaning, laundry, driving, shopping, community activity, and yard work  PERSONAL FACTORS Fibromyalgia; headaches, LBBB; osteopenia  are also affecting patient's functional outcome.   REHAB POTENTIAL:  Good  CLINICAL DECISION MAKING: Evolving/moderate complexity increasing pain in her quads   EVALUATION COMPLEXITY: Moderate   GOALS: Goals reviewed with patient? Yes  SHORT TERM GOALS:  Target date:  Patient will transfer sit to stand smoothly and independently  Baseline: Goal status: INITIAL  2.  Patient will increase gross strength by 5lbs  Baseline:  Goal status: INITIAL  3.  Patient will be independent with base pool program  Baseline:  Goal status: INITIAL LONG TERM GOALS: Target date:   Patient will go improve balance to CGA for all balance exercises to improve safety  Baseline:  Goal status: INITIAL  2.  Patient will reach FOTO goal to demonstrate improved function  Baseline:  Goal status: INITIAL  3.  Patient will have a full balance program  Baseline:  Goal status: INITIAL   PLAN: PT FREQUENCY: 2x/week  PT DURATION: 8 weeks  PLANNED INTERVENTIONS: Therapeutic exercises, Therapeutic activity, Neuromuscular re-education, Balance training, Gait training, Patient/Family education, Self Care, Joint mobilization, Stair training, Aquatic Therapy, Cryotherapy, Moist heat, Taping, Manual therapy, and Re-evaluation  PLAN FOR NEXT SESSION: continue with gait and balance training in the pool. Be aware of bradycardia.  Continue with combination of land and pool exercises.   Kerin Perna, PTA 08/04/22 12:04 PM [facility] [address] [place], [place], [address] Phone: [phone]   Fax:  [phone]

## 2022-08-05 DIAGNOSIS — N201 Calculus of ureter: Secondary | ICD-10-CM | POA: Diagnosis not present

## 2022-08-08 ENCOUNTER — Ambulatory Visit (HOSPITAL_BASED_OUTPATIENT_CLINIC_OR_DEPARTMENT_OTHER): Payer: Medicare Other | Admitting: Physical Therapy

## 2022-08-08 ENCOUNTER — Encounter (HOSPITAL_BASED_OUTPATIENT_CLINIC_OR_DEPARTMENT_OTHER): Payer: Self-pay | Admitting: Physical Therapy

## 2022-08-08 DIAGNOSIS — R2689 Other abnormalities of gait and mobility: Secondary | ICD-10-CM

## 2022-08-08 DIAGNOSIS — G20A1 Parkinson's disease without dyskinesia, without mention of fluctuations: Secondary | ICD-10-CM | POA: Diagnosis not present

## 2022-08-08 NOTE — Therapy (Addendum)
OUTPATIENT PHYSICAL THERAPY LOWER EXTREMITY TREATMENT   Progress Note Reporting Period 06/15/2022 to 08/08/2022  See note below for Objective Data and Assessment of Progress/Goals.     Patient Name: Morgan [name] MRN: [id number] DOB:Sep 14, 1950, 71 y.o., female Today's Date: 08/04/2022   PT End of Session - 08/04/22 1121     Visit Number 9    Number of Visits 16    Date for PT Re-Evaluation 10/03/2021   PT Start Time 1117    PT Stop Time 1148   pt requested to end session early   PT Time Calculation (min) 31 min    Activity Tolerance Patient tolerated treatment well    Behavior During Therapy WFL for tasks assessed/performed               Past Medical History:  Diagnosis Date   Anxiety    Fibromyalgia    Headache    Hypercholesteremia    controlled on medication   Hyperlipidemia    Hypertension    no current meds   LBBB (left bundle branch block)    Melanoma (HCC)    MVP (mitral valve prolapse)    Osteopenia    Parkinson disease    Pneumonia    SVD (spontaneous vaginal delivery)    x 2   Past Surgical History:  Procedure Laterality Date   BLEPHAROPLASTY     cold knife conization     COLONOSCOPY     FH colon CA   CYSTOSCOPY W/ URETERAL STENT PLACEMENT Left 10/18/2021   Procedure: CYSTOSCOPY WITH RETROGRADE PYELOGRAM, URETERAL STENT PLACEMENT;  Surgeon: Festus Aloe, MD;  Location: [facility];  Service: Urology;  Laterality: Left;   DILATATION & CURETTAGE/HYSTEROSCOPY WITH MYOSURE N/A 02/14/2018   Procedure: ATTEMPTED DILATATION & CURETTAGE/HYSTEROSCOPY WITH MYOSURE;  Surgeon: Christophe Louis, MD;  Location: [facility];  Service: Gynecology;  Laterality: N/A;  polyp   melanoma cancer     removed from left leg   RECONSTRUCTION OF EYELID     TONSILLECTOMY     TUBAL LIGATION     URETEROSCOPY WITH HOLMIUM LASER LITHOTRIPSY Left 12/17/2021   Procedure: URETEROSCOPY/HOLMIUM LASER/STENT EXCHANGE;  Surgeon: Festus Aloe, MD;  Location: [facility];  Service: Urology;   Laterality: Left;   WISDOM TOOTH EXTRACTION     Patient Active Problem List   Diagnosis Date Noted   COVID-19 virus infection 05/16/2022   HTN (hypertension) 10/20/2021   Physical deconditioning 10/20/2021   Hydronephrosis with infection 10/18/2021   Bacteremia due to Escherichia coli 10/18/2021   Severe sepsis ([place]) 10/17/2021   UTI (urinary tract infection) 96/78/9381   Acute metabolic encephalopathy 01/75/1025   Parkinson disease 10/17/2021   Cervical stenosis (uterine cervix) 02/14/2018   Postmenopausal bleeding 02/14/2018   LBBB (left bundle branch block) 12/25/2015   PVCs (premature ventricular contractions) 12/25/2015   Hyperlipidemia 12/25/2015   Fibromyalgia 01/16/2014   Cholelithiasis 04/25/2013   Paralysis agitans 01/15/2013   Unspecified hereditary and idiopathic peripheral neuropathy 01/15/2013   [name] MD PCP:   Loistine Chance PROVIDER: Hulan Saas MD   REFERRING DIAG: Parkinsons   THERAPY DIAG:  No diagnosis found.  Rationale for Evaluation and Treatment Rehabilitation  ONSET DATE: Several years   SUBJECTIVE:   SUBJECTIVE STATEMENT:  Pt continues to report pain in her legs.  She reports the pain is about the same as it has been.  Her and her husband would like to start doing more of a split of the pool and working the regular gym.    From  EVAL: The patient has had Parkinsons for many years. She has had land therapy with varying degrees of success. She has recently had an onset of bilateral leg pain that comes and goes. The pain is mostly in the front but can go down to the back as well. She is a member of the gym. She has frequent falls.   PERTINENT HISTORY: Fibromyalgia; headaches, LBBB; osteopenia   PAIN:  Are you having pain? no: NPRS scale: 0/10 right now Pain location:  Pain description:  Aggravating factors: just comes and goes  Relieving factors: just comes and goes; sometimes ibuprofin works but she is limited as to how much she can  take  PRECAUTIONS: Patient reports that because of her heart she will pass out at times.   WEIGHT BEARING RESTRICTIONS No  FALLS:  Has patient fallen in last 6 months? Yes. Number of falls 3-4 most recently slipped on something in the closet and fell face first    LIVING ENVIRONMENT: Stairs to the second floor in her house but she dosen't do much  4 steps in the front  2 steps in the garage  OCCUPATION: retired   Office manager: reading; goes to an exercise class   PLOF: Independent with household mobility with device  PATIENT GOALS  To have less pain in her legs and to move better    OBJECTIVE: * findings taken at evaluation, unless otherwise noted.   DIAGNOSTIC FINDINGS:   PATIENT SURVEYS:  FOTO    COGNITION:  Overall cognitive status: Within functional limits for tasks assessed     SENSATION: Denies paresthesias   POSTURE: No Significant postural limitations  PALPATION: No trigger points noted   LOWER EXTREMITY ROM:  Passive ROM Right eval Left eval  Hip flexion    Hip extension    Hip abduction    Hip adduction    Hip internal rotation    Hip external rotation    Knee flexion    Knee extension    Ankle dorsiflexion    Ankle plantarflexion    Ankle inversion    Ankle eversion     (Blank rows = not tested)  WNL  LOWER EXTREMITY MMT:  MMT Right eval Left eval Right 12/11 Left 12/11  Hip flexion 21.7 29.7 29.6 35.1  Hip extension      Hip abduction 27.6 18.5 20.7 22.4  Hip adduction      Hip internal rotation      Hip external rotation      Knee flexion      Knee extension 23.5 20.2 27.4 25.5  Ankle dorsiflexion      Ankle plantarflexion      Ankle inversion      Ankle eversion       (Blank rows = not tested)  GAIT: Decreased hip flexion; leans to the left; right hip frop. Patient required CGA while walking and min a when she turned for balance.   Functional tests:  Sit to stand: uses's hands and needs several trials to stand from the  table. Poor intial standing balance   12/11 5x sit to stand: 20 seconds        Balance:    Narrow base min a  Narrow base eyes closed min to mod a  Tandem stance Min a both ways.    12/11  Narrow base CGA Narrow base eyes closed miNA  Tandem stance Min a both ways.  Balance    TODAY'S TREATMENT: 12/11 NuStep: Level 2 seat 6 handles setting  9 5 minutes steps per minute between 80 and 100 Row machine 3x15 20lbs   Performed re-assessment on the patient. Reviewed test and measures and goals including strength testing, FOTO, and ROM   Reviewed technique with sit to stand transfers.  Practiced 3x.  Cueing to keep feet more underneath her and shift weight forward. Will perform correctly much easier time transferring sit to stand    12/4: Pt seen for aquatic therapy today.  Treatment took place in water 3.25-4.5 ft in depth at the [facility]. Temp of water was 92.  Pt entered/exited the pool via steps SBA with bilat hand rail.  WC transport to/from pool and to/from stairs at pool.   Therapist providing instruction and supervision while on deck * With UE on black barbell:  forward/back and side stepping;marching high knee forward/ backward - cues to slow speed and for increased step length * Without support:  forward / backward gait * With unilateral resistance bell and staggered stance - forward/ backward weight shift (like vacumming) x 10, side lunge and return to neutral with push/pull of resistance bell x 10 each * Without UE support:  3 way step with return to neutral after each direction (forward/ side/back) x 5 each - demo and cues for improved performance * Side step with shoulder addct with rainbow hand floats (difficulty moving R without shuffling)  * Perched on bench:  TUG like exercise working on speed with cues for big steps and upright posture x 4 * STS on 3rd step from bottom with use of UE for ascending / intermittent for descending (4 sec  eccentric lowering) and gaining balance once upright without UE support x 7 reps  *  Wall push up/off x 10 using core and ankle strategy * Gait drills working on stopping and starting in all directions, varying directions and speeds * back against wall and knee to chest stretch x 10s each  Pt requires the buoyancy and hydrostatic pressure of water for support, and to offload joints by unweighting joint load by at least 50 % in navel deep water and by at least 75-80% in chest to neck deep water.  Viscosity of the water is needed for resistance of strengthening. Water current perturbations provides challenge to standing balance requiring increased core activation.   PATIENT EDUCATION:  Education details: safe use of gym equipment  Person educated: Patient Education method: Explanation, Demonstration, Tactile cues, Verbal cues, and Handouts Education comprehension: verbalized understanding, returned demonstration, verbal cues required, tactile cues required, and needs further education   HOME EXERCISE PROGRAM:  has a program already; aquatic HEP TBD  ASSESSMENT:  CLINICAL IMPRESSION: Patient tolerated gym exercises well today.  She was able to get on and off equipment with contact-guard assist.  We also performed a full reassessment on the patient today.  Her Foto score did not improve but going through her foto she may not have answered her initial foto correctly.  All of her other measures improved significantly.  She has improved strength with all motions except for right hip abduction.  Her static balance has improved.  She is also able to perform 5 times sit to stand test today.  On initial eval she was not able to perform this test.  Overall it appears the patient is making good progress.  She continues to have similar amount of pain but she is doing a lot more activity.  She would like to transition to more of a gym program over the next few  weeks.  She will be going on vacation in January.   When she comes back we will focus more on a gym program.  She would benefit from continued skilled therapy to week 8.  See below for goal specific progress.   OBJECTIVE IMPAIRMENTS Abnormal gait, decreased activity tolerance, decreased balance, decreased mobility, difficulty walking, decreased strength, impaired tone, and pain.   ACTIVITY LIMITATIONS carrying, lifting, bending, standing, squatting, stairs, transfers, bed mobility, dressing, and locomotion level  PARTICIPATION LIMITATIONS: meal prep, cleaning, laundry, driving, shopping, community activity, and yard work  PERSONAL FACTORS Fibromyalgia; headaches, LBBB; osteopenia  are also affecting patient's functional outcome.   REHAB POTENTIAL: Good  CLINICAL DECISION MAKING: Evolving/moderate complexity increasing pain in her quads   EVALUATION COMPLEXITY: Moderate   GOALS: Goals reviewed with patient? Yes  SHORT TERM GOALS: Target date:  Patient will transfer sit to stand smoothly and independently  Baseline: Goal status: Worked on technique with sit to stand transfer today improving  2.  Patient will increase gross strength by 5lbs  Baseline:  Goal status: Met for most muscle groups 12/11  3.  Patient will be independent with base pool program  Baseline:  Goal status: Progressing well towards goals 12/11 LONG TERM GOALS: Target date:   Patient will go improve balance to CGA for all balance exercises to improve safety  Baseline:  Goal status: Improved to contact-guard today with static balance will progress towards more dynamic balance 12/11  2.  Patient will reach FOTO goal to demonstrate improved function  Baseline:  Goal status: Decreased Foto score but after review of answers initial photo was probably not answered correctly 12/11  3.  Patient will have a full balance program  Baseline:  Goal status: Will work more on the balance program as he progressed more to land 12/11   PLAN: PT FREQUENCY: 2x/week  PT  DURATION: 8 weeks  PLANNED INTERVENTIONS: Therapeutic exercises, Therapeutic activity, Neuromuscular re-education, Balance training, Gait training, Patient/Family education, Self Care, Joint mobilization, Stair training, Aquatic Therapy, Cryotherapy, Moist heat, Taping, Manual therapy, and Re-evaluation  PLAN FOR NEXT SESSION: continue with gait and balance training in the pool. Be aware of bradycardia.  Continue with combination of land and pool exercises.  Continue with to progress land activity  Carolyne Littles PT DPT  08/04/22 12:04 PM [facility] [place], [place], [address] Phone: [phone]   Fax:  [phone]

## 2022-08-11 ENCOUNTER — Ambulatory Visit (HOSPITAL_BASED_OUTPATIENT_CLINIC_OR_DEPARTMENT_OTHER): Payer: Medicare Other | Admitting: Physical Therapy

## 2022-08-11 ENCOUNTER — Encounter (HOSPITAL_BASED_OUTPATIENT_CLINIC_OR_DEPARTMENT_OTHER): Payer: Self-pay | Admitting: Physical Therapy

## 2022-08-11 DIAGNOSIS — G20A1 Parkinson's disease without dyskinesia, without mention of fluctuations: Secondary | ICD-10-CM | POA: Diagnosis not present

## 2022-08-11 DIAGNOSIS — R2689 Other abnormalities of gait and mobility: Secondary | ICD-10-CM

## 2022-08-11 NOTE — Therapy (Signed)
OUTPATIENT PHYSICAL THERAPY LOWER EXTREMITY TREATMENT   Patient Name: Morgan [name] MRN: [id number] DOB:10/19/1950, 71 y.o., female Today's Date: 08/11/2022   PT End of Session - 08/11/22 1154     Visit Number 11    Number of Visits 26    Date for PT Re-Evaluation 10/03/22    PT Start Time 1120    PT Stop Time 1152    PT Time Calculation (min) 32 min    Activity Tolerance Patient tolerated treatment well;Patient limited by fatigue    Behavior During Therapy WFL for tasks assessed/performed                Past Medical History:  Diagnosis Date   Anxiety    Fibromyalgia    Headache    Hypercholesteremia    controlled on medication   Hyperlipidemia    Hypertension    no current meds   LBBB (left bundle branch block)    Melanoma (HCC)    MVP (mitral valve prolapse)    Osteopenia    Parkinson disease    Pneumonia    SVD (spontaneous vaginal delivery)    x 2   Past Surgical History:  Procedure Laterality Date   BLEPHAROPLASTY     cold knife conization     COLONOSCOPY     FH colon CA   CYSTOSCOPY W/ URETERAL STENT PLACEMENT Left 10/18/2021   Procedure: CYSTOSCOPY WITH RETROGRADE PYELOGRAM, URETERAL STENT PLACEMENT;  Surgeon: Festus Aloe, MD;  Location: [facility];  Service: Urology;  Laterality: Left;   DILATATION & CURETTAGE/HYSTEROSCOPY WITH MYOSURE N/A 02/14/2018   Procedure: ATTEMPTED DILATATION & CURETTAGE/HYSTEROSCOPY WITH MYOSURE;  Surgeon: Christophe Louis, MD;  Location: [facility];  Service: Gynecology;  Laterality: N/A;  polyp   melanoma cancer     removed from left leg   RECONSTRUCTION OF EYELID     TONSILLECTOMY     TUBAL LIGATION     URETEROSCOPY WITH HOLMIUM LASER LITHOTRIPSY Left 12/17/2021   Procedure: URETEROSCOPY/HOLMIUM LASER/STENT EXCHANGE;  Surgeon: Festus Aloe, MD;  Location: [facility];  Service: Urology;  Laterality: Left;   WISDOM TOOTH EXTRACTION     Patient Active Problem List   Diagnosis Date Noted   COVID-19 virus infection  05/16/2022   HTN (hypertension) 10/20/2021   Physical deconditioning 10/20/2021   Hydronephrosis with infection 10/18/2021   Bacteremia due to Escherichia coli 10/18/2021   Severe sepsis ([place]) 10/17/2021   UTI (urinary tract infection) 38/25/0539   Acute metabolic encephalopathy 76/73/4193   Parkinson disease 10/17/2021   Cervical stenosis (uterine cervix) 02/14/2018   Postmenopausal bleeding 02/14/2018   LBBB (left bundle branch block) 12/25/2015   PVCs (premature ventricular contractions) 12/25/2015   Hyperlipidemia 12/25/2015   Fibromyalgia 01/16/2014   Cholelithiasis 04/25/2013   Paralysis agitans 01/15/2013   Unspecified hereditary and idiopathic peripheral neuropathy 01/15/2013   Morgan Ada MD PCP:   Loistine Chance PROVIDER: Hulan Saas MD   REFERRING DIAG: Parkinsons   THERAPY DIAG:  No diagnosis found.  Rationale for Evaluation and Treatment Rehabilitation  ONSET DATE: Several years   SUBJECTIVE:   SUBJECTIVE STATEMENT: "Morgan [name] says I'm making progress".     From EVAL: The patient has had Parkinsons for many years. She has had land therapy with varying degrees of success. She has recently had an onset of bilateral leg pain that comes and goes. The pain is mostly in the front but can go down to the back as well. She is a member of the gym. She has frequent falls.  PERTINENT HISTORY: Fibromyalgia; headaches, LBBB; osteopenia   PAIN:  Are you having pain? no: NPRS scale: 0/10 right now Pain location:  Pain description:  Aggravating factors: just comes and goes  Relieving factors: just comes and goes; sometimes ibuprofin works but she is limited as to how much she can take  PRECAUTIONS: Patient reports that because of her heart she will pass out at times.   WEIGHT BEARING RESTRICTIONS No  FALLS:  Has patient fallen in last 6 months? Yes. Number of falls 3-4 most recently slipped on something in the closet and fell face first    LIVING ENVIRONMENT: Stairs  to the second floor in her house but she dosen't do much  4 steps in the front  2 steps in the garage  OCCUPATION: retired   Office manager: reading; goes to an exercise class   PLOF: Independent with household mobility with device  PATIENT GOALS  To have less pain in her legs and to move better    OBJECTIVE: * findings taken at evaluation, unless otherwise noted.   DIAGNOSTIC FINDINGS:   PATIENT SURVEYS:  FOTO    COGNITION:  Overall cognitive status: Within functional limits for tasks assessed     SENSATION: Denies paresthesias   POSTURE: No Significant postural limitations  PALPATION: No trigger points noted   LOWER EXTREMITY ROM:  Passive ROM Right eval Left eval  Hip flexion    Hip extension    Hip abduction    Hip adduction    Hip internal rotation    Hip external rotation    Knee flexion    Knee extension    Ankle dorsiflexion    Ankle plantarflexion    Ankle inversion    Ankle eversion     (Blank rows = not tested)  WNL  LOWER EXTREMITY MMT:  MMT Right eval Left eval right left  Hip flexion 21.7 29.7 29.6 35.1  Hip extension      Hip abduction 27.6 18.5 20.7 22.4  Hip adduction      Hip internal rotation      Hip external rotation      Knee flexion      Knee extension 23.5 20.2 27.4 25.5  Ankle dorsiflexion      Ankle plantarflexion      Ankle inversion      Ankle eversion       (Blank rows = not tested)  GAIT: Decreased hip flexion; leans to the left; right hip frop. Patient required CGA while walking and min a when she turned for balance.   Functional tests:  Sit to stand: uses's hands and needs several trials to stand from the table. Poor intial standing balance   12/11 5x sit to stand: 20 seconds   BALANCE: 08/08/22 Narrow base min A  Narrow base eyes closed min to mod A  Tandem stance Min A both ways.   TODAY'S TREATMENT:  12/14: Pt seen for aquatic therapy today.  Treatment took place in water 3.25-4 ft in depth at the  [facility] pool. Temp of water was 92.  Pt entered/exited the pool via steps SBA with bilat hand rail.  WC transport to/from pool and to/from stairs at pool.   Therapist providing instruction while in water with patient (per pt request) * With UE on rainbow hand floats:  forward/back walking pushing floats up/down under water * holding yellow noodle:  high knee marching  * without UE support:  side stepping with side arm reach, cues for big steps *  holding rainbow hand floats:  SLS with opp leg swings in frontal and sagittal planes;  SLS x 30 sec each LE * Holding wall:  heel raises *  Wall push up/off x 10 using core and ankle strategy * Hip wall bumps using hip strategy * Without UE support:  3 way step and reach with return to neutral after each direction (forward/ side/back) x 5 each - demo and cues for improved performance * Wide stance with bilat UE oscillations in sagittal and transverse planes with resistance bells  * Perched on bench:  TUG like exercise working on increased speed with cues for big steps and upright posture x 4 * STS on 4rd step from bottom with use of UE for ascending / descending  and gaining balance once upright without UE support x 3 reps   Pt requires the buoyancy and hydrostatic pressure of water for support, and to offload joints by unweighting joint load by at least 50 % in navel deep water and by at least 75-80% in chest to neck deep water.  Viscosity of the water is needed for resistance of strengthening. Water current perturbations provides challenge to standing balance requiring increased core activation.  12/11 NuStep: Level 2. seat 6. handles setting 9. 5 minutes steps per minute between 80 and 100 Row machine 3x15 20lbs   Performed re-assessment on the patient. Reviewed test and measures and goals including strength testing, FOTO, and ROM   Reviewed technique with sit to stand transfers.  Practiced 3x.  Cueing to keep feet more underneath her  and shift weight forward. Will perform correctly much easier time transferring sit to stand     PATIENT EDUCATION:  Education details: safe use of gym equipment  Person educated: Patient Education method: Explanation, Demonstration, Tactile cues, Verbal cues, and Handouts Education comprehension: verbalized understanding, returned demonstration, verbal cues required, tactile cues required, and needs further education   HOME EXERCISE PROGRAM:  has a program already; aquatic HEP TBD  ASSESSMENT:  CLINICAL IMPRESSION: Continued work on weight shifts/ returning to neutral, gait speed with increased step length, reactive balance for stops and starts.   The pool environment was quiet today providing less distraction during session. Side stepping Rt was completed with less shuffling this date.  Session ended early due to fatigue, but she participated well throughout with minimal rest breaks in between exercises. Pt is progressing towards goals.    OBJECTIVE IMPAIRMENTS Abnormal gait, decreased activity tolerance, decreased balance, decreased mobility, difficulty walking, decreased strength, impaired tone, and pain.   ACTIVITY LIMITATIONS carrying, lifting, bending, standing, squatting, stairs, transfers, bed mobility, dressing, and locomotion level  PARTICIPATION LIMITATIONS: meal prep, cleaning, laundry, driving, shopping, community activity, and yard work  PERSONAL FACTORS Fibromyalgia; headaches, LBBB; osteopenia  are also affecting patient's functional outcome.   REHAB POTENTIAL: Good  CLINICAL DECISION MAKING: Evolving/moderate complexity increasing pain in her quads   EVALUATION COMPLEXITY: Moderate   GOALS: Goals reviewed with patient? Yes  SHORT TERM GOALS: Target date:  Patient will transfer sit to stand smoothly and independently  Baseline: Goal status: Worked on technique with sit to stand transfer today improving  2.  Patient will increase gross strength by 5lbs   Baseline:  Goal status: Met for most muscle groups 12/11  3.  Patient will be independent with base pool program  Baseline:  Goal status: Progressing well towards goals 12/11 LONG TERM GOALS: Target date:   Patient will go improve balance to CGA for all balance exercises to improve  safety  Baseline:  Goal status: Improved to contact-guard today with static balance will progress towards more dynamic balance 12/11  2.  Patient will reach FOTO goal to demonstrate improved function  Baseline:  Goal status: Decreased Foto score but after review of answers initial photo was probably not answered correctly 12/11  3.  Patient will have a full balance program  Baseline:  Goal status: Will work more on the balance program as he progressed more to land 12/11   PLAN: PT FREQUENCY: 2x/week  PT DURATION: 8 weeks  PLANNED INTERVENTIONS: Therapeutic exercises, Therapeutic activity, Neuromuscular re-education, Balance training, Gait training, Patient/Family education, Self Care, Joint mobilization, Stair training, Aquatic Therapy, Cryotherapy, Moist heat, Taping, Manual therapy, and Re-evaluation  PLAN FOR NEXT SESSION: continue with gait and balance training in the pool. Be aware of bradycardia.  Continue with combination of land and pool exercises.  Continue with to progress land activity  Kerin Perna, PTA 08/11/22 1:16 PM [facility] [place], [place], [address] Phone: [phone]   Fax:  [phone]

## 2022-08-15 ENCOUNTER — Ambulatory Visit (HOSPITAL_BASED_OUTPATIENT_CLINIC_OR_DEPARTMENT_OTHER): Payer: Medicare Other | Admitting: Physical Therapy

## 2022-08-15 ENCOUNTER — Encounter (HOSPITAL_BASED_OUTPATIENT_CLINIC_OR_DEPARTMENT_OTHER): Payer: Self-pay | Admitting: Physical Therapy

## 2022-08-15 ENCOUNTER — Telehealth: Payer: Self-pay

## 2022-08-15 DIAGNOSIS — R2689 Other abnormalities of gait and mobility: Secondary | ICD-10-CM | POA: Diagnosis not present

## 2022-08-15 DIAGNOSIS — G20A1 Parkinson's disease without dyskinesia, without mention of fluctuations: Secondary | ICD-10-CM | POA: Diagnosis not present

## 2022-08-15 NOTE — Patient Instructions (Signed)
Visit Information  Thank you for taking time to visit with me today. Please don't hesitate to contact me if I can be of assistance to you.   Following are the goals we discussed today:   Goals Addressed             This Visit's Progress    COMPLETED: Assistance with [place] Coordination Interventions: Social Work referral for help with respite care set up   Patient now doing water therapy and Parkinson's therapy at the [facility].  Still wanting to get in home -respite set up through insurance.  Social work to follow up again. 08/15/22-Patient states that they want to stop call at this time.            If you are experiencing a Mental Health or [place] or need someone to talk to, please call the Suicide and Crisis Lifeline: 988   Patient verbalizes understanding of instructions and care plan provided today and agrees to view in [place]. Active MyChart status and patient understanding of how to access instructions and care plan via MyChart confirmed with patient.     No further follow up required: decline  Jone Baseman, RN, MSN [facility] Care Management Coordinator Direct Line [phone]

## 2022-08-15 NOTE — Patient Outreach (Signed)
  Care Coordination   Follow Up Visit Note   08/15/2022 Name: Morgan [name] MRN: [id number] DOB: [birth year]  Morgan [name] is a 71 y.o. year old female who sees [name], MD for primary care. I spoke with  Taneeka L Devins by phone today. She states that she is declining calls at this time.  Advised that CM would stop calls.    What matters to the patients health and wellness today? N/A   Goals Addressed             This Visit's Progress    COMPLETED: Assistance with Respite Care       Care Coordination Interventions: Social Work referral for help with respite care set up   Patient now doing water therapy and Parkinson's therapy at the [facility].  Still wanting to get in home -respite set up through insurance.  Social work to follow up again. 08/15/22-Patient states that they want to stop call at this time.          SDOH assessments and interventions completed:  No     Care Coordination Interventions:  No, not indicated   Follow up plan: No further intervention required.   Encounter Outcome:  Pt. Refused   Jone Baseman, RN, MSN [facility] Care Management Coordinator Direct Line [phone]

## 2022-08-15 NOTE — Therapy (Signed)
OUTPATIENT PHYSICAL THERAPY LOWER EXTREMITY TREATMENT   Patient Name: Morgan [name] MRN: [id number] DOB:August 28, 1951, 71 y.o., female Today's Date: 08/15/2022   PT End of Session - 08/15/22 1108     Visit Number 12    Number of Visits 26    Date for PT Re-Evaluation 10/03/22    PT Start Time 1109    PT Stop Time 1141    PT Time Calculation (min) 32 min    Activity Tolerance Patient limited by fatigue    Behavior During Therapy WFL for tasks assessed/performed                Past Medical History:  Diagnosis Date   Anxiety    Fibromyalgia    Headache    Hypercholesteremia    controlled on medication   Hyperlipidemia    Hypertension    no current meds   LBBB (left bundle branch block)    Melanoma (HCC)    MVP (mitral valve prolapse)    Osteopenia    Parkinson disease    Pneumonia    SVD (spontaneous vaginal delivery)    x 2   Past Surgical History:  Procedure Laterality Date   BLEPHAROPLASTY     cold knife conization     COLONOSCOPY     FH colon CA   CYSTOSCOPY W/ URETERAL STENT PLACEMENT Left 10/18/2021   Procedure: CYSTOSCOPY WITH RETROGRADE PYELOGRAM, URETERAL STENT PLACEMENT;  Surgeon: Festus Aloe, MD;  Location: [facility];  Service: Urology;  Laterality: Left;   DILATATION & CURETTAGE/HYSTEROSCOPY WITH MYOSURE N/A 02/14/2018   Procedure: ATTEMPTED DILATATION & CURETTAGE/HYSTEROSCOPY WITH MYOSURE;  Surgeon: Christophe Louis, MD;  Location: [facility];  Service: Gynecology;  Laterality: N/A;  polyp   melanoma cancer     removed from left leg   RECONSTRUCTION OF EYELID     TONSILLECTOMY     TUBAL LIGATION     URETEROSCOPY WITH HOLMIUM LASER LITHOTRIPSY Left 12/17/2021   Procedure: URETEROSCOPY/HOLMIUM LASER/STENT EXCHANGE;  Surgeon: Festus Aloe, MD;  Location: [facility];  Service: Urology;  Laterality: Left;   WISDOM TOOTH EXTRACTION     Patient Active Problem List   Diagnosis Date Noted   COVID-19 virus infection 05/16/2022   HTN (hypertension)  10/20/2021   Physical deconditioning 10/20/2021   Hydronephrosis with infection 10/18/2021   Bacteremia due to Escherichia coli 10/18/2021   Severe sepsis ([place]) 10/17/2021   UTI (urinary tract infection) 62/86/3817   Acute metabolic encephalopathy 71/16/5790   Parkinson disease 10/17/2021   Cervical stenosis (uterine cervix) 02/14/2018   Postmenopausal bleeding 02/14/2018   LBBB (left bundle branch block) 12/25/2015   PVCs (premature ventricular contractions) 12/25/2015   Hyperlipidemia 12/25/2015   Fibromyalgia 01/16/2014   Cholelithiasis 04/25/2013   Paralysis agitans 01/15/2013   Unspecified hereditary and idiopathic peripheral neuropathy 01/15/2013   [name] MD PCP:   Loistine Chance PROVIDER: Hulan Saas MD   REFERRING DIAG: Parkinsons   THERAPY DIAG:  Other abnormalities of gait and mobility  Parkinson's disease without dyskinesia or fluctuating manifestations  Rationale for Evaluation and Treatment Rehabilitation  ONSET DATE: Several years   SUBJECTIVE:   SUBJECTIVE STATEMENT:  Pt reports she woke up with sore legs today. "Enough where I had trouble getting off the couch".     From EVAL: The patient has had Parkinsons for many years. She has had land therapy with varying degrees of success. She has recently had an onset of bilateral leg pain that comes and goes. The pain is mostly in the front  but can go down to the back as well. She is a member of the gym. She has frequent falls.   PERTINENT HISTORY: Fibromyalgia; headaches, LBBB; osteopenia   PAIN:  Are you having pain? yes: NPRS scale: 4/10 right now Pain location:  Pain description:  Aggravating factors: just comes and goes  Relieving factors: just comes and goes; sometimes ibuprofin works but she is limited as to how much she can take  PRECAUTIONS: Patient reports that because of her heart she will pass out at times.   WEIGHT BEARING RESTRICTIONS No  FALLS:  Has patient fallen in last 6 months?  Yes. Number of falls 3-4 most recently slipped on something in the closet and fell face first    LIVING ENVIRONMENT: Stairs to the second floor in her house but she dosen't do much  4 steps in the front  2 steps in the garage  OCCUPATION: retired   Office manager: reading; goes to an exercise class   PLOF: Independent with household mobility with device  PATIENT GOALS  To have less pain in her legs and to move better    OBJECTIVE: * findings taken at evaluation, unless otherwise noted.   DIAGNOSTIC FINDINGS:   PATIENT SURVEYS:  FOTO    COGNITION:  Overall cognitive status: Within functional limits for tasks assessed     SENSATION: Denies paresthesias   POSTURE: No Significant postural limitations  PALPATION: No trigger points noted   LOWER EXTREMITY ROM:  Passive ROM Right eval Left eval  Hip flexion    Hip extension    Hip abduction    Hip adduction    Hip internal rotation    Hip external rotation    Knee flexion    Knee extension    Ankle dorsiflexion    Ankle plantarflexion    Ankle inversion    Ankle eversion     (Blank rows = not tested)  WNL  LOWER EXTREMITY MMT:  MMT Right eval Left eval right left  Hip flexion 21.7 29.7 29.6 35.1  Hip extension      Hip abduction 27.6 18.5 20.7 22.4  Hip adduction      Hip internal rotation      Hip external rotation      Knee flexion      Knee extension 23.5 20.2 27.4 25.5  Ankle dorsiflexion      Ankle plantarflexion      Ankle inversion      Ankle eversion       (Blank rows = not tested)  GAIT: Decreased hip flexion; leans to the left; right hip frop. Patient required CGA while walking and min a when she turned for balance.   Functional tests:  Sit to stand: uses's hands and needs several trials to stand from the table. Poor intial standing balance   12/11 5x sit to stand: 20 seconds   BALANCE: 08/08/22 Narrow base min A  Narrow base eyes closed min to mod A  Tandem stance Min A both ways.    TODAY'S TREATMENT: 12/18 NuStep seat 7, UE #9: L3 ->2 at 3.5 min.  90 and 100 for 5.5 min, then 70-80 SPM x 2 min Row machine, cybex:  13.5#-18.5# x 10, x 15, 10 Chest press -0# x 10 LF Seated leg press 10#, x 10 x 10, cues for full range LF hip abdct 30# x 10, 15, 15 LF hip add 35# x 10, x 10 LF tricep press 25# x 10, 30# x 10   12/14:  Pt seen for aquatic therapy today.  Treatment took place in water 3.25-4 ft in depth at the [facility] pool. Temp of water was 92.  Pt entered/exited the pool via steps SBA with bilat hand rail.  WC transport to/from pool and to/from stairs at pool.   Therapist providing instruction while in water with patient (per pt request) * With UE on rainbow hand floats:  forward/back walking pushing floats up/down under water * holding yellow noodle:  high knee marching  * without UE support:  side stepping with side arm reach, cues for big steps * holding rainbow hand floats:  SLS with opp leg swings in frontal and sagittal planes;  SLS x 30 sec each LE * Holding wall:  heel raises *  Wall push up/off x 10 using core and ankle strategy * Hip wall bumps using hip strategy * Without UE support:  3 way step and reach with return to neutral after each direction (forward/ side/back) x 5 each - demo and cues for improved performance * Wide stance with bilat UE oscillations in sagittal and transverse planes with resistance bells  * Perched on bench:  TUG like exercise working on increased speed with cues for big steps and upright posture x 4 * STS on 4rd step from bottom with use of UE for ascending / descending  and gaining balance once upright without UE support x 3 reps   Pt requires the buoyancy and hydrostatic pressure of water for support, and to offload joints by unweighting joint load by at least 50 % in navel deep water and by at least 75-80% in chest to neck deep water.  Viscosity of the water is needed for resistance of strengthening. Water  current perturbations provides challenge to standing balance requiring increased core activation.  12/11 NuStep: Level 2. seat 6. handles setting 9. 5 minutes steps per minute between 80 and 100 Row machine 3x15 20lbs   Performed re-assessment on the patient. Reviewed test and measures and goals including strength testing, FOTO, and ROM   Reviewed technique with sit to stand transfers.  Practiced 3x.  Cueing to keep feet more underneath her and shift weight forward. Will perform correctly much easier time transferring sit to stand     PATIENT EDUCATION:  Education details: safe use of gym equipment  Person educated: Patient Education method: Explanation, Demonstration, Tactile cues, Verbal cues, and Handouts Education comprehension: verbalized understanding, returned demonstration, verbal cues required, tactile cues required, and needs further education   HOME EXERCISE PROGRAM:  has a program already; aquatic HEP TBD  ASSESSMENT:  CLINICAL IMPRESSION: Pt seen in clinic and completed strengthening exercises on gym equipment per her request.  She requires CGA and cues to lengthen steps during gait in between equipment for safety.  Cues during session given to slow the speed of the reps.  No change in pain rating during session. Discussed with husband and pt options for safe ambulation between equipment (TS:[id number], scooter, walking stick, HHA from husband); will need to problem solve of what will be the safest option.  Pt is progressing gradually towards goals.    OBJECTIVE IMPAIRMENTS Abnormal gait, decreased activity tolerance, decreased balance, decreased mobility, difficulty walking, decreased strength, impaired tone, and pain.   ACTIVITY LIMITATIONS carrying, lifting, bending, standing, squatting, stairs, transfers, bed mobility, dressing, and locomotion level  PARTICIPATION LIMITATIONS: meal prep, cleaning, laundry, driving, shopping, community activity, and yard  work  PERSONAL FACTORS Fibromyalgia; headaches, LBBB; osteopenia  are also affecting patient's functional outcome.  REHAB POTENTIAL: Good  CLINICAL DECISION MAKING: Evolving/moderate complexity increasing pain in her quads   EVALUATION COMPLEXITY: Moderate   GOALS: Goals reviewed with patient? Yes  SHORT TERM GOALS: Target date: 07/13/2022 Patient will transfer sit to stand smoothly and independently  Baseline: Goal status: Worked on technique with sit to stand transfer today improving  2.  Patient will increase gross strength by 5lbs  Baseline:  Goal status: Met for most muscle groups 12/11  3.  Patient will be independent with base pool program  Baseline:  Goal status: Progressing well towards goals 12/11 LONG TERM GOALS: Target date: 10/03/2022  Patient will go improve balance to CGA for all balance exercises to improve safety  Baseline:  Goal status: Improved to contact-guard today with static balance will progress towards more dynamic balance 12/11  2.  Patient will reach FOTO goal to demonstrate improved function  Baseline:  Goal status: Decreased Foto score but after review of answers initial photo was probably not answered correctly 12/11  3.  Patient will have a full balance program  Baseline:  Goal status: Will work more on the balance program as he progressed more to land 12/11   PLAN: PT FREQUENCY: 2x/week  PT DURATION: 8 weeks  PLANNED INTERVENTIONS: Therapeutic exercises, Therapeutic activity, Neuromuscular re-education, Balance training, Gait training, Patient/Family education, Self Care, Joint mobilization, Stair training, Aquatic Therapy, Cryotherapy, Moist heat, Taping, Manual therapy, and Re-evaluation  PLAN FOR NEXT SESSION: continue with gait and balance training in the pool. Be aware of bradycardia.  Continue with combination of land and pool exercises.  Continue with to progress land activity  Kerin Perna, PTA 08/15/22 12:10 PM [facility] [place], [place], [address] Phone: [phone]   Fax:  [phone]

## 2022-08-18 ENCOUNTER — Ambulatory Visit (HOSPITAL_BASED_OUTPATIENT_CLINIC_OR_DEPARTMENT_OTHER): Payer: Medicare Other | Admitting: Physical Therapy

## 2022-08-18 DIAGNOSIS — G20A1 Parkinson's disease without dyskinesia, without mention of fluctuations: Secondary | ICD-10-CM | POA: Diagnosis not present

## 2022-08-18 DIAGNOSIS — R2689 Other abnormalities of gait and mobility: Secondary | ICD-10-CM | POA: Diagnosis not present

## 2022-08-18 NOTE — Therapy (Signed)
OUTPATIENT PHYSICAL THERAPY LOWER EXTREMITY TREATMENT   Patient Name: Morgan [name] MRN: [id number] DOB:[birth year], 71 y.o., female Today's Date: 08/18/2022   PT End of Session - 08/18/22 1201     Visit Number 13    Number of Visits 26    Date for PT Re-Evaluation 10/03/22    PT Start Time 1117    PT Stop Time 1147    PT Time Calculation (min) 30 min    Activity Tolerance Patient limited by fatigue    Behavior During Therapy WFL for tasks assessed/performed                 Past Medical History:  Diagnosis Date   Anxiety    Fibromyalgia    Headache    Hypercholesteremia    controlled on medication   Hyperlipidemia    Hypertension    no current meds   LBBB (left bundle branch block)    Melanoma (HCC)    MVP (mitral valve prolapse)    Osteopenia    Parkinson disease    Pneumonia    SVD (spontaneous vaginal delivery)    x 2   Past Surgical History:  Procedure Laterality Date   BLEPHAROPLASTY     cold knife conization     COLONOSCOPY     FH colon CA   CYSTOSCOPY W/ URETERAL STENT PLACEMENT Left 10/18/2021   Procedure: CYSTOSCOPY WITH RETROGRADE PYELOGRAM, URETERAL STENT PLACEMENT;  Surgeon: Festus Aloe, MD;  Location: [facility];  Service: Urology;  Laterality: Left;   DILATATION & CURETTAGE/HYSTEROSCOPY WITH MYOSURE N/A 02/14/2018   Procedure: ATTEMPTED DILATATION & CURETTAGE/HYSTEROSCOPY WITH MYOSURE;  Surgeon: Christophe Louis, MD;  Location: [facility];  Service: Gynecology;  Laterality: N/A;  polyp   melanoma cancer     removed from left leg   RECONSTRUCTION OF EYELID     TONSILLECTOMY     TUBAL LIGATION     URETEROSCOPY WITH HOLMIUM LASER LITHOTRIPSY Left 12/17/2021   Procedure: URETEROSCOPY/HOLMIUM LASER/STENT EXCHANGE;  Surgeon: Festus Aloe, MD;  Location: [facility];  Service: Urology;  Laterality: Left;   WISDOM TOOTH EXTRACTION     Patient Active Problem List   Diagnosis Date Noted   COVID-19 virus infection 05/16/2022   HTN (hypertension)  10/20/2021   Physical deconditioning 10/20/2021   Hydronephrosis with infection 10/18/2021   Bacteremia due to Escherichia coli 10/18/2021   Severe sepsis ([place]) 10/17/2021   UTI (urinary tract infection) 40/98/1191   Acute metabolic encephalopathy 47/82/9562   Parkinson disease 10/17/2021   Cervical stenosis (uterine cervix) 02/14/2018   Postmenopausal bleeding 02/14/2018   LBBB (left bundle branch block) 12/25/2015   PVCs (premature ventricular contractions) 12/25/2015   Hyperlipidemia 12/25/2015   Fibromyalgia 01/16/2014   Cholelithiasis 04/25/2013   Paralysis agitans 01/15/2013   Unspecified hereditary and idiopathic peripheral neuropathy 01/15/2013   [name] MD PCP:   Loistine Chance PROVIDER: Hulan Saas MD   REFERRING DIAG: Parkinsons   THERAPY DIAG:  Other abnormalities of gait and mobility  Parkinson's disease without dyskinesia or fluctuating manifestations  Rationale for Evaluation and Treatment Rehabilitation  ONSET DATE: Several years   SUBJECTIVE:   SUBJECTIVE STATEMENT:  Pt reports she was tired but did not have increased pain after last land session.     From EVAL: The patient has had Parkinsons for many years. She has had land therapy with varying degrees of success. She has recently had an onset of bilateral leg pain that comes and goes. The pain is mostly in the front but can  go down to the back as well. She is a member of the gym. She has frequent falls.   PERTINENT HISTORY: Fibromyalgia; headaches, LBBB; osteopenia   PAIN:  Are you having pain? yes: NPRS scale: 4/10 right now Pain location: BLE Pain description: sore Aggravating factors: just comes and goes  Relieving factors: just comes and goes; sometimes ibuprofin works but she is limited as to how much she can take  PRECAUTIONS: Patient reports that because of her heart she will pass out at times.   WEIGHT BEARING RESTRICTIONS No  FALLS:  Has patient fallen in last 6 months? Yes.  Number of falls 3-4 most recently slipped on something in the closet and fell face first    LIVING ENVIRONMENT: Stairs to the second floor in her house but she dosen't do much  4 steps in the front  2 steps in the garage  OCCUPATION: retired   Office manager: reading; goes to an exercise class   PLOF: Independent with household mobility with device  PATIENT GOALS  To have less pain in her legs and to move better    OBJECTIVE: * findings taken at evaluation, unless otherwise noted.   DIAGNOSTIC FINDINGS:   PATIENT SURVEYS:  FOTO    COGNITION:  Overall cognitive status: Within functional limits for tasks assessed     SENSATION: Denies paresthesias   POSTURE: No Significant postural limitations  PALPATION: No trigger points noted   LOWER EXTREMITY ROM:  Passive ROM Right eval Left eval  Hip flexion    Hip extension    Hip abduction    Hip adduction    Hip internal rotation    Hip external rotation    Knee flexion    Knee extension    Ankle dorsiflexion    Ankle plantarflexion    Ankle inversion    Ankle eversion     (Blank rows = not tested)  WNL  LOWER EXTREMITY MMT:  MMT Right eval Left eval right left  Hip flexion 21.7 29.7 29.6 35.1  Hip extension      Hip abduction 27.6 18.5 20.7 22.4  Hip adduction      Hip internal rotation      Hip external rotation      Knee flexion      Knee extension 23.5 20.2 27.4 25.5  Ankle dorsiflexion      Ankle plantarflexion      Ankle inversion      Ankle eversion       (Blank rows = not tested)  GAIT: Decreased hip flexion; leans to the left; right hip frop. Patient required CGA while walking and min a when she turned for balance.   Functional tests:  Sit to stand: uses's hands and needs several trials to stand from the table. Poor intial standing balance   12/11 5x sit to stand: 20 seconds   BALANCE: 08/08/22 Narrow base min A  Narrow base eyes closed min to mod A  Tandem stance Min A both ways.    TODAY'S TREATMENT:  12/21: Pt seen for aquatic therapy today.  Treatment took place in water 3.25-4 ft in depth at the [facility] pool. Temp of water was 88.  Pt entered/exited the pool via steps SBA with bilat hand rail.  WC transport to/from pool and to/from stairs at pool.   Therapist providing instruction while in water with patient (per pt request) * Without support: forward/backward walking, side stepping  * side stepping with arm abdct/ addct with rainbow hand floats *  without UE support:  side stepping with side arm reach, cues for big steps/big reach ( improved cadence) * holding rainbow hand floats:  SLS with opp leg swings in sagittal plane * single arm on wall with leg swings into abdct/ add (crossing midline) x 10 each (back began to hurt afterwards) * hip hinge with arm reach with kick board, cues to slow speed * tricep arm push downs with rainbow hand floats * return to walking forward/ side stepping unsupported  *  Wall push up/off x 10 using core and ankle strategy * Hip wall bumps using hip strategy x 10 * Without UE support:  forward and side step and reach with return to neutral after each direction  with single arm row with resistance bell - utilized mirroring for improved timing  * Perched on bench:  TUG like exercise working on increased speed with cues for big steps and upright posture x 4   Pt requires the buoyancy and hydrostatic pressure of water for support, and to offload joints by unweighting joint load by at least 50 % in navel deep water and by at least 75-80% in chest to neck deep water.  Viscosity of the water is needed for resistance of strengthening. Water current perturbations provides challenge to standing balance requiring increased core activation.  12/18 NuStep seat 7, UE #9: L3 ->2 at 3.5 min.  90 and 100 for 5.5 min, then 70-80 SPM x 2 min Row machine, cybex:  13.5#-18.5# x 10, x 15, 10 Chest press -0# x 10 LF Seated leg press 10#, x  10 x 10, cues for full range LF hip abdct 30# x 10, 15, 15 LF hip add 35# x 10, x 10 LF tricep press 25# x 10, 30# x 10   12/14: Pt seen for aquatic therapy today.  Treatment took place in water 3.25-4 ft in depth at the [facility] pool. Temp of water was 92.  Pt entered/exited the pool via steps SBA with bilat hand rail.  WC transport to/from pool and to/from stairs at pool.   Therapist providing instruction while in water with patient (per pt request) * With UE on rainbow hand floats:  forward/back walking pushing floats up/down under water * holding yellow noodle:  high knee marching  * without UE support:  side stepping with side arm reach, cues for big steps * holding rainbow hand floats:  SLS with opp leg swings in frontal and sagittal planes;  SLS x 30 sec each LE * Holding wall:  heel raises *  Wall push up/off x 10 using core and ankle strategy * Hip wall bumps using hip strategy  x10  * Without UE support:  forward and side step and reach with return to neutral with single resistance bell * Wide stance with bilat UE oscillations in sagittal and transverse planes with resistance bells  * Perched on bench:  TUG like exercise working on increased speed with cues for big steps and upright posture x 4 * STS on 4rd step from bottom with use of UE for ascending / descending  and gaining balance once upright without UE support x 3 reps   Pt requires the buoyancy and hydrostatic pressure of water for support, and to offload joints by unweighting joint load by at least 50 % in navel deep water and by at least 75-80% in chest to neck deep water.  Viscosity of the water is needed for resistance of strengthening. Water current perturbations provides challenge to standing  balance requiring increased core activation.  12/11 NuStep: Level 2. seat 6. handles setting 9. 5 minutes steps per minute between 80 and 100 Row machine 3x15 20lbs   Performed re-assessment on the patient.  Reviewed test and measures and goals including strength testing, FOTO, and ROM   Reviewed technique with sit to stand transfers.  Practiced 3x.  Cueing to keep feet more underneath her and shift weight forward. Will perform correctly much easier time transferring sit to stand     PATIENT EDUCATION:  Education details:aquatic modifications  Person educated: Patient Education method: Explanation, Demonstration, Tactile cues, Verbal cues, and Handouts Education comprehension: verbalized understanding, returned demonstration, verbal cues required, tactile cues required, and needs further education   HOME EXERCISE PROGRAM:  has a program already; aquatic HEP TBD  ASSESSMENT:  CLINICAL IMPRESSION: Pt is able to complete exercises and gait in water with supervision/SBA and able to self correct any loss of balance.  She requires mod cues during session to slow the speed of the reps or for bigger step length (especially with LLE)..  No change in pain rating during session.   Pt is progressing gradually towards goals.    OBJECTIVE IMPAIRMENTS Abnormal gait, decreased activity tolerance, decreased balance, decreased mobility, difficulty walking, decreased strength, impaired tone, and pain.   ACTIVITY LIMITATIONS carrying, lifting, bending, standing, squatting, stairs, transfers, bed mobility, dressing, and locomotion level  PARTICIPATION LIMITATIONS: meal prep, cleaning, laundry, driving, shopping, community activity, and yard work  PERSONAL FACTORS Fibromyalgia; headaches, LBBB; osteopenia  are also affecting patient's functional outcome.   REHAB POTENTIAL: Good  CLINICAL DECISION MAKING: Evolving/moderate complexity increasing pain in her quads   EVALUATION COMPLEXITY: Moderate   GOALS: Goals reviewed with patient? Yes  SHORT TERM GOALS: Target date: 07/13/2022 Patient will transfer sit to stand smoothly and independently  Baseline: Goal status: Worked on technique with sit to  stand transfer today improving  2.  Patient will increase gross strength by 5lbs  Baseline:  Goal status: Met for most muscle groups 12/11  3.  Patient will be independent with base pool program  Baseline:  Goal status: Progressing well towards goals 12/11 LONG TERM GOALS: Target date: 10/03/2022  Patient will go improve balance to CGA for all balance exercises to improve safety  Baseline:  Goal status: Improved to contact-guard today with static balance will progress towards more dynamic balance 12/11  2.  Patient will reach FOTO goal to demonstrate improved function  Baseline:  Goal status: Decreased Foto score but after review of answers initial photo was probably not answered correctly 12/11  3.  Patient will have a full balance program  Baseline:  Goal status: Will work more on the balance program as he progressed more to land 12/11   PLAN: PT FREQUENCY: 2x/week  PT DURATION: 8 weeks  PLANNED INTERVENTIONS: Therapeutic exercises, Therapeutic activity, Neuromuscular re-education, Balance training, Gait training, Patient/Family education, Self Care, Joint mobilization, Stair training, Aquatic Therapy, Cryotherapy, Moist heat, Taping, Manual therapy, and Re-evaluation  PLAN FOR NEXT SESSION: continue with gait and balance training in the pool. Be aware of bradycardia.  Continue with combination of land and pool exercises.  Continue with to progress land activity  Kerin Perna, PTA 08/18/22 1:48 PM [facility] [address] [place], [place], [address] Phone: [phone]   Fax:  [phone]

## 2022-08-25 ENCOUNTER — Ambulatory Visit (HOSPITAL_BASED_OUTPATIENT_CLINIC_OR_DEPARTMENT_OTHER): Payer: Medicare Other | Admitting: Physical Therapy

## 2022-08-25 DIAGNOSIS — R2689 Other abnormalities of gait and mobility: Secondary | ICD-10-CM

## 2022-08-25 DIAGNOSIS — G20A1 Parkinson's disease without dyskinesia, without mention of fluctuations: Secondary | ICD-10-CM | POA: Diagnosis not present

## 2022-08-25 NOTE — Therapy (Signed)
OUTPATIENT PHYSICAL THERAPY LOWER EXTREMITY TREATMENT   Patient Name: Morgan [name] MRN: [id number] DOB:[birth year], 71 y.o., female Today's Date: 08/25/2022   PT End of Session - 08/25/22 1158     Visit Number 14    Number of Visits 26    Date for PT Re-Evaluation 10/03/22    PT Start Time 1118    PT Stop Time 1145    PT Time Calculation (min) 27 min    Activity Tolerance Patient tolerated treatment well    Behavior During Therapy WFL for tasks assessed/performed                  Past Medical History:  Diagnosis Date   Anxiety    Fibromyalgia    Headache    Hypercholesteremia    controlled on medication   Hyperlipidemia    Hypertension    no current meds   LBBB (left bundle branch block)    Melanoma (HCC)    MVP (mitral valve prolapse)    Osteopenia    Parkinson disease    Pneumonia    SVD (spontaneous vaginal delivery)    x 2   Past Surgical History:  Procedure Laterality Date   BLEPHAROPLASTY     cold knife conization     COLONOSCOPY     FH colon CA   CYSTOSCOPY W/ URETERAL STENT PLACEMENT Left 10/18/2021   Procedure: CYSTOSCOPY WITH RETROGRADE PYELOGRAM, URETERAL STENT PLACEMENT;  Surgeon: Festus Aloe, MD;  Location: [facility];  Service: Urology;  Laterality: Left;   DILATATION & CURETTAGE/HYSTEROSCOPY WITH MYOSURE N/A 02/14/2018   Procedure: ATTEMPTED DILATATION & CURETTAGE/HYSTEROSCOPY WITH MYOSURE;  Surgeon: Christophe Louis, MD;  Location: [facility];  Service: Gynecology;  Laterality: N/A;  polyp   melanoma cancer     removed from left leg   RECONSTRUCTION OF EYELID     TONSILLECTOMY     TUBAL LIGATION     URETEROSCOPY WITH HOLMIUM LASER LITHOTRIPSY Left 12/17/2021   Procedure: URETEROSCOPY/HOLMIUM LASER/STENT EXCHANGE;  Surgeon: Festus Aloe, MD;  Location: [facility];  Service: Urology;  Laterality: Left;   WISDOM TOOTH EXTRACTION     Patient Active Problem List   Diagnosis Date Noted   COVID-19 virus infection 05/16/2022   HTN  (hypertension) 10/20/2021   Physical deconditioning 10/20/2021   Hydronephrosis with infection 10/18/2021   Bacteremia due to Escherichia coli 10/18/2021   Severe sepsis ([place]) 10/17/2021   UTI (urinary tract infection) 94/70/9628   Acute metabolic encephalopathy 36/62/9476   Parkinson disease 10/17/2021   Cervical stenosis (uterine cervix) 02/14/2018   Postmenopausal bleeding 02/14/2018   LBBB (left bundle branch block) 12/25/2015   PVCs (premature ventricular contractions) 12/25/2015   Hyperlipidemia 12/25/2015   Fibromyalgia 01/16/2014   Cholelithiasis 04/25/2013   Paralysis agitans 01/15/2013   Unspecified hereditary and idiopathic peripheral neuropathy 01/15/2013   [name] MD PCP:   Loistine Chance PROVIDER: Hulan Saas MD   REFERRING DIAG: Parkinsons   THERAPY DIAG:  Other abnormalities of gait and mobility  Parkinson's disease without dyskinesia or fluctuating manifestations  Rationale for Evaluation and Treatment Rehabilitation  ONSET DATE: Several years   SUBJECTIVE:   SUBJECTIVE STATEMENT:  Pt's husband reported that he injured his back since last visit; assisting pt with ADLs has been difficult.  She reports no new changes since last visit.  They are preparing for trip to [place] in January.    From EVAL: The patient has had Parkinsons for many years. She has had land therapy with varying degrees of success. She  has recently had an onset of bilateral leg pain that comes and goes. The pain is mostly in the front but can go down to the back as well. She is a member of the gym. She has frequent falls.   PERTINENT HISTORY: Fibromyalgia; headaches, LBBB; osteopenia   PAIN:  Are you having pain? yes: NPRS scale: 4/10 right now Pain location: "everywhere" Pain description: sore Aggravating factors: just comes and goes  Relieving factors: just comes and goes; sometimes ibuprofin works but she is limited as to how much she can take  PRECAUTIONS: Patient reports  that because of her heart she will pass out at times.   WEIGHT BEARING RESTRICTIONS No  FALLS:  Has patient fallen in last 6 months? Yes. Number of falls 3-4 most recently slipped on something in the closet and fell face first    LIVING ENVIRONMENT: Stairs to the second floor in her house but she dosen't do much  4 steps in the front  2 steps in the garage  OCCUPATION: retired   Office manager: reading; goes to an exercise class   PLOF: Independent with household mobility with device  PATIENT GOALS  To have less pain in her legs and to move better    OBJECTIVE: * findings taken at evaluation, unless otherwise noted.   DIAGNOSTIC FINDINGS:   PATIENT SURVEYS:  FOTO    COGNITION:  Overall cognitive status: Within functional limits for tasks assessed     SENSATION: Denies paresthesias   POSTURE: No Significant postural limitations  PALPATION: No trigger points noted   LOWER EXTREMITY ROM:  Passive ROM Right eval Left eval  Hip flexion    Hip extension    Hip abduction    Hip adduction    Hip internal rotation    Hip external rotation    Knee flexion    Knee extension    Ankle dorsiflexion    Ankle plantarflexion    Ankle inversion    Ankle eversion     (Blank rows = not tested)  WNL  LOWER EXTREMITY MMT:  MMT Right eval Left eval right left  Hip flexion 21.7 29.7 29.6 35.1  Hip extension      Hip abduction 27.6 18.5 20.7 22.4  Hip adduction      Hip internal rotation      Hip external rotation      Knee flexion      Knee extension 23.5 20.2 27.4 25.5  Ankle dorsiflexion      Ankle plantarflexion      Ankle inversion      Ankle eversion       (Blank rows = not tested)  GAIT: Decreased hip flexion; leans to the left; right hip frop. Patient required CGA while walking and min a when she turned for balance.   Functional tests:  Sit to stand: uses's hands and needs several trials to stand from the table. Poor intial standing balance   12/11 5x sit  to stand: 20 seconds   BALANCE: 08/08/22 Narrow base min A  Narrow base eyes closed min to mod A  Tandem stance Min A both ways.   TODAY'S TREATMENT: 12/28: Pt seen for aquatic therapy today.  Treatment took place in water 3.25-4 ft in depth at the [facility] pool. Temp of water was 92.  Pt entered/exited the pool via steps with supervision, with bilat hand rail.  WC transport to/from pool and to/from stairs at pool.   Therapist providing instruction from deck * walking with  UE on yellow noodle x 4 laps forward, 2 backward * without UE support:  side stepping with side arm reach, cues for big steps * With UE on rainbow hand floats:  pushing floats up/down under water x 10 (tricep push downs) * side step with arm abdct/ add with rainbow hand floats * side step with reach - 2 laps *  Wall push up/off x 10 using core and ankle strategy * Hip wall bumps using hip strategy  x10  * LSVT-sideways rock at reach ( reach across midline) x 8 (irritated back); squat to reach up high x 6 * holding rainbow hand floats:  high knee marching forward/backward * forward step with reach (with unilateral hand float) and return to neutral x 10 each LE * stretches at steps:  bilat calf with heels off of step; side to side lunge for adductor stretch;  R/L hamstring stretch with foot on 2nd step x 2 reps of 10 sec each   Pt requires the buoyancy and hydrostatic pressure of water for support, and to offload joints by unweighting joint load by at least 50 % in navel deep water and by at least 75-80% in chest to neck deep water.  Viscosity of the water is needed for resistance of strengthening. Water current perturbations provides challenge to standing balance requiring increased core activation.  12/21: Pt seen for aquatic therapy today.  Treatment took place in water 3.25-4 ft in depth at the [facility] pool. Temp of water was 88.  Pt entered/exited the pool via steps SBA with bilat hand  rail.  WC transport to/from pool and to/from stairs at pool.   Therapist providing instruction while in water with patient (per pt request) * Without support: forward/backward walking, side stepping  * side stepping with arm abdct/ addct with rainbow hand floats * without UE support:  side stepping with side arm reach, cues for big steps/big reach ( improved cadence) * holding rainbow hand floats:  SLS with opp leg swings in sagittal plane * single arm on wall with leg swings into abdct/ add (crossing midline) x 10 each (back began to hurt afterwards) * hip hinge with arm reach with kick board, cues to slow speed * tricep arm push downs with rainbow hand floats * return to walking forward/ side stepping unsupported  *  Wall push up/off x 10 using core and ankle strategy * Hip wall bumps using hip strategy x 10 * Without UE support:  forward and side step and reach with return to neutral after each direction  with single arm row with resistance bell - utilized mirroring for improved timing  * Perched on bench:  TUG like exercise working on increased speed with cues for big steps and upright posture x 4   Pt requires the buoyancy and hydrostatic pressure of water for support, and to offload joints by unweighting joint load by at least 50 % in navel deep water and by at least 75-80% in chest to neck deep water.  Viscosity of the water is needed for resistance of strengthening. Water current perturbations provides challenge to standing balance requiring increased core activation.  12/18 NuStep seat 7, UE #9: L3 ->2 at 3.5 min.  90 and 100 for 5.5 min, then 70-80 SPM x 2 min Row machine, cybex:  13.5#-18.5# x 10, x 15, 10 Chest press -0# x 10 LF Seated leg press 10#, x 10 x 10, cues for full range LF hip abdct 30# x 10, 15, 15 LF  hip add 35# x 10, x 10 LF tricep press 25# x 10, 30# x 10  12/11 NuStep: Level 2. seat 6. handles setting 9. 5 minutes steps per minute between 80 and 100 Row  machine 3x15 20lbs   Performed re-assessment on the patient. Reviewed test and measures and goals including strength testing, FOTO, and ROM   Reviewed technique with sit to stand transfers.  Practiced 3x.  Cueing to keep feet more underneath her and shift weight forward. Will perform correctly much easier time transferring sit to stand     PATIENT EDUCATION:  Education details:aquatic modifications Person educated: Patient Education method: Explanation, Demonstration, Tactile cues, Verbal cues, and Handouts Education comprehension: verbalized understanding, returned demonstration, verbal cues required, tactile cues required, and needs further education   HOME EXERCISE PROGRAM:  has a program already; aquatic HEP TBD  ASSESSMENT:  CLINICAL IMPRESSION: Pt is able to complete exercises and gait in water with supervision and able to self correct any loss of balance.  She requires mod cues during session to slow the speed of the reps or for bigger step length (especially with LLE). Some visual cues/demonstration needed for improved form.   She reported some back irritation with twist and reach motion; eased with change in exercise. Pt is progressing gradually towards goals.    OBJECTIVE IMPAIRMENTS Abnormal gait, decreased activity tolerance, decreased balance, decreased mobility, difficulty walking, decreased strength, impaired tone, and pain.   ACTIVITY LIMITATIONS carrying, lifting, bending, standing, squatting, stairs, transfers, bed mobility, dressing, and locomotion level  PARTICIPATION LIMITATIONS: meal prep, cleaning, laundry, driving, shopping, community activity, and yard work  PERSONAL FACTORS Fibromyalgia; headaches, LBBB; osteopenia  are also affecting patient's functional outcome.   REHAB POTENTIAL: Good  CLINICAL DECISION MAKING: Evolving/moderate complexity increasing pain in her quads   EVALUATION COMPLEXITY: Moderate   GOALS: Goals reviewed with patient?  Yes  SHORT TERM GOALS: Target date: 07/13/2022 Patient will transfer sit to stand smoothly and independently  Baseline: Goal status: Worked on technique with sit to stand transfer today improving  2.  Patient will increase gross strength by 5lbs  Baseline:  Goal status: Met for most muscle groups 12/11  3.  Patient will be independent with base pool program  Baseline:  Goal status: Progressing well towards goals 12/11 LONG TERM GOALS: Target date: 10/03/2022  Patient will go improve balance to CGA for all balance exercises to improve safety  Baseline:  Goal status: Improved to contact-guard today with static balance will progress towards more dynamic balance 12/11  2.  Patient will reach FOTO goal to demonstrate improved function  Baseline:  Goal status: Decreased Foto score but after review of answers initial photo was probably not answered correctly 12/11  3.  Patient will have a full balance program  Baseline:  Goal status: Will work more on the balance program as he progressed more to land 12/11   PLAN: PT FREQUENCY: 2x/week  PT DURATION: 8 weeks  PLANNED INTERVENTIONS: Therapeutic exercises, Therapeutic activity, Neuromuscular re-education, Balance training, Gait training, Patient/Family education, Self Care, Joint mobilization, Stair training, Aquatic Therapy, Cryotherapy, Moist heat, Taping, Manual therapy, and Re-evaluation  PLAN FOR NEXT SESSION: continue with gait and balance training in the pool. Be aware of bradycardia.  Continue with combination of land and pool exercises.  Continue with to progress land activity

## 2022-08-31 ENCOUNTER — Ambulatory Visit (HOSPITAL_BASED_OUTPATIENT_CLINIC_OR_DEPARTMENT_OTHER): Payer: Medicare Other | Attending: Family Medicine | Admitting: Physical Therapy

## 2022-08-31 ENCOUNTER — Encounter (HOSPITAL_BASED_OUTPATIENT_CLINIC_OR_DEPARTMENT_OTHER): Payer: Self-pay | Admitting: Physical Therapy

## 2022-08-31 DIAGNOSIS — R2689 Other abnormalities of gait and mobility: Secondary | ICD-10-CM

## 2022-08-31 DIAGNOSIS — G20A1 Parkinson's disease without dyskinesia, without mention of fluctuations: Secondary | ICD-10-CM | POA: Diagnosis not present

## 2022-08-31 NOTE — Therapy (Signed)
OUTPATIENT PHYSICAL THERAPY LOWER EXTREMITY TREATMENT   Patient Name: Morgan [name] MRN: [id number] DOB:1951-02-02, 72 y.o., female Today's Date: 08/31/2022   PT End of Session - 08/31/22 1119     Visit Number 15    Number of Visits 26    Date for PT Re-Evaluation 10/03/22    PT Start Time 1119    PT Stop Time 1151    PT Time Calculation (min) 32 min    Activity Tolerance Patient limited by fatigue    Behavior During Therapy WFL for tasks assessed/performed                  Past Medical History:  Diagnosis Date   Anxiety    Fibromyalgia    Headache    Hypercholesteremia    controlled on medication   Hyperlipidemia    Hypertension    no current meds   LBBB (left bundle branch block)    Melanoma (HCC)    MVP (mitral valve prolapse)    Osteopenia    Parkinson disease    Pneumonia    SVD (spontaneous vaginal delivery)    x 2   Past Surgical History:  Procedure Laterality Date   BLEPHAROPLASTY     cold knife conization     COLONOSCOPY     FH colon CA   CYSTOSCOPY W/ URETERAL STENT PLACEMENT Left 10/18/2021   Procedure: CYSTOSCOPY WITH RETROGRADE PYELOGRAM, URETERAL STENT PLACEMENT;  Surgeon: Festus Aloe, MD;  Location: [facility];  Service: Urology;  Laterality: Left;   DILATATION & CURETTAGE/HYSTEROSCOPY WITH MYOSURE N/A 02/14/2018   Procedure: ATTEMPTED DILATATION & CURETTAGE/HYSTEROSCOPY WITH MYOSURE;  Surgeon: Christophe Louis, MD;  Location: [facility];  Service: Gynecology;  Laterality: N/A;  polyp   melanoma cancer     removed from left leg   RECONSTRUCTION OF EYELID     TONSILLECTOMY     TUBAL LIGATION     URETEROSCOPY WITH HOLMIUM LASER LITHOTRIPSY Left 12/17/2021   Procedure: URETEROSCOPY/HOLMIUM LASER/STENT EXCHANGE;  Surgeon: Festus Aloe, MD;  Location: [facility];  Service: Urology;  Laterality: Left;   WISDOM TOOTH EXTRACTION     Patient Active Problem List   Diagnosis Date Noted   COVID-19 virus infection 05/16/2022   HTN (hypertension)  10/20/2021   Physical deconditioning 10/20/2021   Hydronephrosis with infection 10/18/2021   Bacteremia due to Escherichia coli 10/18/2021   Severe sepsis ([place]) 10/17/2021   UTI (urinary tract infection) 25/12/3974   Acute metabolic encephalopathy 73/41/9379   Parkinson disease 10/17/2021   Cervical stenosis (uterine cervix) 02/14/2018   Postmenopausal bleeding 02/14/2018   LBBB (left bundle branch block) 12/25/2015   PVCs (premature ventricular contractions) 12/25/2015   Hyperlipidemia 12/25/2015   Fibromyalgia 01/16/2014   Cholelithiasis 04/25/2013   Paralysis agitans 01/15/2013   Unspecified hereditary and idiopathic peripheral neuropathy 01/15/2013   [name] MD PCP:   Loistine Chance PROVIDER: Hulan Saas MD   REFERRING DIAG: Parkinsons   THERAPY DIAG:  Other abnormalities of gait and mobility  Parkinson's disease without dyskinesia or fluctuating manifestations  Rationale for Evaluation and Treatment Rehabilitation  ONSET DATE: Several years   SUBJECTIVE:   SUBJECTIVE STATEMENT: "My legs started hurting again on Sunday" and still hurt today.    From EVAL: The patient has had Parkinsons for many years. She has had land therapy with varying degrees of success. She has recently had an onset of bilateral leg pain that comes and goes. The pain is mostly in the front but can go down to the back  as well. She is a member of the gym. She has frequent falls.   PERTINENT HISTORY: Fibromyalgia; headaches, LBBB; osteopenia   PAIN:  Are you having pain? yes: NPRS scale:- not rated. Pain location: "everywhere" "Legs" Pain description: sore Aggravating factors: just comes and goes  Relieving factors: just comes and goes; sometimes ibuprofin works but she is limited as to how much she can take  PRECAUTIONS: Patient reports that because of her heart she will pass out at times.   WEIGHT BEARING RESTRICTIONS No  FALLS:  Has patient fallen in last 6 months? Yes. Number of  falls 3-4 most recently slipped on something in the closet and fell face first    LIVING ENVIRONMENT: Stairs to the second floor in her house but she dosen't do much  4 steps in the front  2 steps in the garage  OCCUPATION: retired   Office manager: reading; goes to an exercise class   PLOF: Independent with household mobility with device  PATIENT GOALS  To have less pain in her legs and to move better    OBJECTIVE: * findings taken at evaluation, unless otherwise noted.   DIAGNOSTIC FINDINGS:   PATIENT SURVEYS:  FOTO    COGNITION:  Overall cognitive status: Within functional limits for tasks assessed     SENSATION: Denies paresthesias   POSTURE: No Significant postural limitations  PALPATION: No trigger points noted   LOWER EXTREMITY ROM:  Passive ROM Right eval Left eval  Hip flexion    Hip extension    Hip abduction    Hip adduction    Hip internal rotation    Hip external rotation    Knee flexion    Knee extension    Ankle dorsiflexion    Ankle plantarflexion    Ankle inversion    Ankle eversion     (Blank rows = not tested)  WNL  LOWER EXTREMITY MMT:  MMT Right eval Left eval right left  Hip flexion 21.7 29.7 29.6 35.1  Hip extension      Hip abduction 27.6 18.5 20.7 22.4  Hip adduction      Hip internal rotation      Hip external rotation      Knee flexion      Knee extension 23.5 20.2 27.4 25.5  Ankle dorsiflexion      Ankle plantarflexion      Ankle inversion      Ankle eversion       (Blank rows = not tested)  GAIT: Decreased hip flexion; leans to the left; right hip frop. Patient required CGA while walking and min a when she turned for balance.   Functional tests:  Sit to stand: uses's hands and needs several trials to stand from the table. Poor intial standing balance   12/11 5x sit to stand: 20 seconds   BALANCE: 08/08/22 Narrow base min A  Narrow base eyes closed min to mod A  Tandem stance Min A both ways.   TODAY'S  TREATMENT: 08/31/22  CGA for safe gait between machines NuStep seat 7, UE #9: L3 x 2 min at 70 SPM, L1 x 3 min at 45 SPM - 5 min total Cybex lat pull down- 10# x 15, x 2 Cybex chest press- 5# x 5,  0# x 10 Cybex Row machine:  13.5#  x 10 x 2 LF hip abdct 35# x  15, 15 LF hip add 35# x 15, x 10 LF Seated leg press 5#, x 10 x 2, cues for  full range  12/28: Pt seen for aquatic therapy today.  Treatment took place in water 3.25-4 ft in depth at the [facility] pool. Temp of water was 92.  Pt entered/exited the pool via steps with supervision, with bilat hand rail.  WC transport to/from pool and to/from stairs at pool.   Therapist providing instruction from deck * walking with UE on yellow noodle x 4 laps forward, 2 backward * without UE support:  side stepping with side arm reach, cues for big steps * With UE on rainbow hand floats:  pushing floats up/down under water x 10 (tricep push downs) * side step with arm abdct/ add with rainbow hand floats * side step with reach - 2 laps *  Wall push up/off x 10 using core and ankle strategy * Hip wall bumps using hip strategy  x10  * LSVT-sideways rock at reach ( reach across midline) x 8 (irritated back); squat to reach up high x 6 * holding rainbow hand floats:  high knee marching forward/backward * forward step with reach (with unilateral hand float) and return to neutral x 10 each LE * stretches at steps:  bilat calf with heels off of step; side to side lunge for adductor stretch;  R/L hamstring stretch with foot on 2nd step x 2 reps of 10 sec each   Pt requires the buoyancy and hydrostatic pressure of water for support, and to offload joints by unweighting joint load by at least 50 % in navel deep water and by at least 75-80% in chest to neck deep water.  Viscosity of the water is needed for resistance of strengthening. Water current perturbations provides challenge to standing balance requiring increased core activation.  12/21: Pt  seen for aquatic therapy today.  Treatment took place in water 3.25-4 ft in depth at the [facility] pool. Temp of water was 88.  Pt entered/exited the pool via steps SBA with bilat hand rail.  WC transport to/from pool and to/from stairs at pool.   Therapist providing instruction while in water with patient (per pt request) * Without support: forward/backward walking, side stepping  * side stepping with arm abdct/ addct with rainbow hand floats * without UE support:  side stepping with side arm reach, cues for big steps/big reach ( improved cadence) * holding rainbow hand floats:  SLS with opp leg swings in sagittal plane * single arm on wall with leg swings into abdct/ add (crossing midline) x 10 each (back began to hurt afterwards) * hip hinge with arm reach with kick board, cues to slow speed * tricep arm push downs with rainbow hand floats * return to walking forward/ side stepping unsupported  *  Wall push up/off x 10 using core and ankle strategy * Hip wall bumps using hip strategy x 10 * Without UE support:  forward and side step and reach with return to neutral after each direction  with single arm row with resistance bell - utilized mirroring for improved timing  * Perched on bench:  TUG like exercise working on increased speed with cues for big steps and upright posture x 4   Pt requires the buoyancy and hydrostatic pressure of water for support, and to offload joints by unweighting joint load by at least 50 % in navel deep water and by at least 75-80% in chest to neck deep water.  Viscosity of the water is needed for resistance of strengthening. Water current perturbations provides challenge to standing balance requiring increased core  activation.  12/18 NuStep seat 7, UE #9: L3 ->2 at 3.5 min.  90 and 100 for 5.5 min, then 70-80 SPM x 2 min Row machine, cybex:  13.5#-18.5# x 10, x 15, 10 Chest press -0# x 10 LF Seated leg press 10#, x 10 x 10, cues for full range LF hip  abdct 30# x 10, 15, 15 LF hip add 35# x 10, x 10 LF tricep press 25# x 10, 30# x 10  12/11 NuStep: Level 2. seat 6. handles setting 9. 5 minutes steps per minute between 80 and 100 Row machine 3x15 20lbs   Performed re-assessment on the patient. Reviewed test and measures and goals including strength testing, FOTO, and ROM   Reviewed technique with sit to stand transfers.  Practiced 3x.  Cueing to keep feet more underneath her and shift weight forward. Will perform correctly much easier time transferring sit to stand     PATIENT EDUCATION:  Education details:aquatic modifications Person educated: Patient Education method: Explanation, Demonstration, Tactile cues, Verbal cues, and Handouts Education comprehension: verbalized understanding, returned demonstration, verbal cues required, tactile cues required, and needs further education   HOME EXERCISE PROGRAM:  has a program already; aquatic HEP TBD  ASSESSMENT:  CLINICAL IMPRESSION: Pt demonstrating increased fatigue in LEs upon arrival.  NuStep resistance decreased to level 1 with improved tolerance; pt also moving slower on machine today.  Less cues required for slower speed, but minor cues for full available ROM when lifting weights.  CGA provided to pt when moving between machines due to unsteadiness (despite use of walking stick) and cues for wider/bigger steps.  Encouraged pt's husband to provide CGA around hips for better pt handling in case LOB (vs holding onto her sweatshirt).  No report of increase in pain, just fatigue.  Pt is progressing gradually towards goals. Has one more visit prior to vacation out of stated.     OBJECTIVE IMPAIRMENTS Abnormal gait, decreased activity tolerance, decreased balance, decreased mobility, difficulty walking, decreased strength, impaired tone, and pain.   ACTIVITY LIMITATIONS carrying, lifting, bending, standing, squatting, stairs, transfers, bed mobility, dressing, and locomotion  level  PARTICIPATION LIMITATIONS: meal prep, cleaning, laundry, driving, shopping, community activity, and yard work  PERSONAL FACTORS Fibromyalgia; headaches, LBBB; osteopenia  are also affecting patient's functional outcome.   REHAB POTENTIAL: Good  CLINICAL DECISION MAKING: Evolving/moderate complexity increasing pain in her quads   EVALUATION COMPLEXITY: Moderate   GOALS: Goals reviewed with patient? Yes  SHORT TERM GOALS: Target date: 07/13/2022 Patient will transfer sit to stand smoothly and independently  Baseline: Goal status: Worked on technique with sit to stand transfer today improving  2.  Patient will increase gross strength by 5lbs  Baseline:  Goal status: Met for most muscle groups 12/11  3.  Patient will be independent with base pool program  Baseline:  Goal status: Progressing well towards goals 12/11 LONG TERM GOALS: Target date: 10/03/2022  Patient will go improve balance to CGA for all balance exercises to improve safety  Baseline:  Goal status: Improved to contact-guard today with static balance will progress towards more dynamic balance 12/11  2.  Patient will reach FOTO goal to demonstrate improved function  Baseline:  Goal status: Decreased Foto score but after review of answers initial photo was probably not answered correctly 12/11  3.  Patient will have a full balance program  Baseline:  Goal status: Will work more on the balance program as he progressed more to land 12/11   PLAN: PT  FREQUENCY: 2x/week  PT DURATION: 8 weeks  PLANNED INTERVENTIONS: Therapeutic exercises, Therapeutic activity, Neuromuscular re-education, Balance training, Gait training, Patient/Family education, Self Care, Joint mobilization, Stair training, Aquatic Therapy, Cryotherapy, Moist heat, Taping, Manual therapy, and Re-evaluation  PLAN FOR NEXT SESSION: continue with gait and balance training in the pool. Be aware of bradycardia.  Continue with combination of land  and pool exercises.  Continue with to progress land activity  Kerin Perna, PTA 08/31/22 2:03 PM [facility] [place], [place], [address] Phone: [phone]   Fax:  [phone]

## 2022-08-31 NOTE — Addendum Note (Signed)
Addended by: Carney Living on: 08/31/2022 03:44 PM   Modules accepted: Orders

## 2022-09-02 ENCOUNTER — Encounter (HOSPITAL_BASED_OUTPATIENT_CLINIC_OR_DEPARTMENT_OTHER): Payer: Self-pay | Admitting: Physical Therapy

## 2022-09-02 ENCOUNTER — Ambulatory Visit (HOSPITAL_BASED_OUTPATIENT_CLINIC_OR_DEPARTMENT_OTHER): Payer: Medicare Other | Admitting: Physical Therapy

## 2022-09-02 DIAGNOSIS — R2689 Other abnormalities of gait and mobility: Secondary | ICD-10-CM

## 2022-09-02 DIAGNOSIS — G20A1 Parkinson's disease without dyskinesia, without mention of fluctuations: Secondary | ICD-10-CM

## 2022-09-02 NOTE — Therapy (Signed)
OUTPATIENT PHYSICAL THERAPY LOWER EXTREMITY TREATMENT   Patient Name: Morgan [name] MRN: [id number] DOB:[birth year], 73 y.o., female Today's Date: 09/02/2022   PT End of Session - 09/02/22 1114     Visit Number 16    Number of Visits 26    Date for PT Re-Evaluation 10/03/22    PT Start Time 1118    PT Stop Time 1153    PT Time Calculation (min) 35 min    Activity Tolerance Patient limited by fatigue    Behavior During Therapy WFL for tasks assessed/performed                  Past Medical History:  Diagnosis Date   Anxiety    Fibromyalgia    Headache    Hypercholesteremia    controlled on medication   Hyperlipidemia    Hypertension    no current meds   LBBB (left bundle branch block)    Melanoma (HCC)    MVP (mitral valve prolapse)    Osteopenia    Parkinson disease    Pneumonia    SVD (spontaneous vaginal delivery)    x 2   Past Surgical History:  Procedure Laterality Date   BLEPHAROPLASTY     cold knife conization     COLONOSCOPY     FH colon CA   CYSTOSCOPY W/ URETERAL STENT PLACEMENT Left 10/18/2021   Procedure: CYSTOSCOPY WITH RETROGRADE PYELOGRAM, URETERAL STENT PLACEMENT;  Surgeon: Festus Aloe, MD;  Location: [facility];  Service: Urology;  Laterality: Left;   DILATATION & CURETTAGE/HYSTEROSCOPY WITH MYOSURE N/A 02/14/2018   Procedure: ATTEMPTED DILATATION & CURETTAGE/HYSTEROSCOPY WITH MYOSURE;  Surgeon: Christophe Louis, MD;  Location: [facility];  Service: Gynecology;  Laterality: N/A;  polyp   melanoma cancer     removed from left leg   RECONSTRUCTION OF EYELID     TONSILLECTOMY     TUBAL LIGATION     URETEROSCOPY WITH HOLMIUM LASER LITHOTRIPSY Left 12/17/2021   Procedure: URETEROSCOPY/HOLMIUM LASER/STENT EXCHANGE;  Surgeon: Festus Aloe, MD;  Location: [facility];  Service: Urology;  Laterality: Left;   WISDOM TOOTH EXTRACTION     Patient Active Problem List   Diagnosis Date Noted   COVID-19 virus infection 05/16/2022   HTN (hypertension)  10/20/2021   Physical deconditioning 10/20/2021   Hydronephrosis with infection 10/18/2021   Bacteremia due to Escherichia coli 10/18/2021   Severe sepsis ([place]) 10/17/2021   UTI (urinary tract infection) 89/16/9450   Acute metabolic encephalopathy 38/88/2800   Parkinson disease 10/17/2021   Cervical stenosis (uterine cervix) 02/14/2018   Postmenopausal bleeding 02/14/2018   LBBB (left bundle branch block) 12/25/2015   PVCs (premature ventricular contractions) 12/25/2015   Hyperlipidemia 12/25/2015   Fibromyalgia 01/16/2014   Cholelithiasis 04/25/2013   Paralysis agitans 01/15/2013   Unspecified hereditary and idiopathic peripheral neuropathy 01/15/2013   [name] MD PCP:   REFERRING PROVIDER: Hulan Saas MD   REFERRING DIAG: Parkinsons   THERAPY DIAG:  Other abnormalities of gait and mobility  Parkinson's disease without dyskinesia or fluctuating manifestations  Rationale for Evaluation and Treatment Rehabilitation  ONSET DATE: Several years   SUBJECTIVE:   SUBJECTIVE STATEMENT: "The stuff we did in gym the other day.... my legs really hurt".   Pt reports her legs have started to recover from the land session.     From EVAL: The patient has had Parkinsons for many years. She has had land therapy with varying degrees of success. She has recently had an onset of bilateral leg pain that  comes and goes. The pain is mostly in the front but can go down to the back as well. She is a member of the gym. She has frequent falls.   PERTINENT HISTORY: Fibromyalgia; headaches, LBBB; osteopenia   PAIN:  Are you having pain? yes: NPRS scale:4/10 Pain location: "Legs" Pain description: sore Aggravating factors: just comes and goes  Relieving factors: just comes and goes; sometimes ibuprofin works but she is limited as to how much she can take  PRECAUTIONS: Patient reports that because of her heart she will pass out at times.   WEIGHT BEARING RESTRICTIONS No  FALLS:   Has patient fallen in last 6 months? Yes. Number of falls 3-4 most recently slipped on something in the closet and fell face first    LIVING ENVIRONMENT: Stairs to the second floor in her house but she dosen't do much  4 steps in the front  2 steps in the garage  OCCUPATION: retired   Office manager: reading; goes to an exercise class   PLOF: Independent with household mobility with device  PATIENT GOALS  To have less pain in her legs and to move better    OBJECTIVE: * findings taken at evaluation, unless otherwise noted.   DIAGNOSTIC FINDINGS:   PATIENT SURVEYS:  FOTO    COGNITION:  Overall cognitive status: Within functional limits for tasks assessed     SENSATION: Denies paresthesias   POSTURE: No Significant postural limitations  PALPATION: No trigger points noted   LOWER EXTREMITY ROM:  Passive ROM Right eval Left eval  Hip flexion    Hip extension    Hip abduction    Hip adduction    Hip internal rotation    Hip external rotation    Knee flexion    Knee extension    Ankle dorsiflexion    Ankle plantarflexion    Ankle inversion    Ankle eversion     (Blank rows = not tested)  WNL  LOWER EXTREMITY MMT:  MMT Right eval Left eval right left  Hip flexion 21.7 29.7 29.6 35.1  Hip extension      Hip abduction 27.6 18.5 20.7 22.4  Hip adduction      Hip internal rotation      Hip external rotation      Knee flexion      Knee extension 23.5 20.2 27.4 25.5  Ankle dorsiflexion      Ankle plantarflexion      Ankle inversion      Ankle eversion       (Blank rows = not tested)  GAIT: Decreased hip flexion; leans to the left; right hip frop. Patient required CGA while walking and min a when she turned for balance.   Functional tests:  Sit to stand: uses's hands and needs several trials to stand from the table. Poor intial standing balance   12/11 5x sit to stand: 20 seconds   BALANCE: 08/08/22 Narrow base min A  Narrow base eyes closed min to  mod A  Tandem stance Min A both ways.   TODAY'S TREATMENT: 1/5: Pt seen for aquatic therapy today.  Treatment took place in water 3.25-4 ft in depth at the [facility] pool. Temp of water was 92.  Pt entered/exited the pool via steps with supervision, with bilat hand rail.  WC transport to/from pool and to/from stairs at pool.   Therapist providing instruction from deck * walking without UE support x 4 laps forward/backward *  side stepping with side arm  reach, cues for big steps x 2 laps; additional lap holding blue hand float  * high knee marching * forward step with reach (with unilateral hand float) and return to neutral x 10 each LE *  Wall push up/off x 10 using core and ankle strategy (demo/cues for improved speed and form) * LSVT sideways rock and reach (crossing midline at shoulder height) x 10; repeated side to side weight shift with single overhead reach x 10 * Hip wall bumps using hip strategy  x10  * side to side lunge near wall * STS holding yellow noodle with minA from therapist holding noodle x 3; pt with posterior lean despite cues - changed exercises * TUG like exercise x 4  floats:  high knee marching forward/back * stretches at steps:  bilat calf with heels off of step;  R/L hamstring stretch with foot on 2nd step x 2 reps of 10 sec each; L stretch x 10s x 2  08/31/22:  CGA for safe gait between machines NuStep seat 7, UE #9: L3 x 2 min at 70 SPM, L1 x 3 min at 45 SPM - 5 min total Cybex lat pull down- 10# x 15, x 2 Cybex chest press- 5# x 5,  0# x 10 Cybex Row machine:  13.5#  x 10 x 2 LF hip abdct 35# x  15, 15 LF hip add 35# x 15, x 10 LF Seated leg press 5#, x 10 x 2, cues for full range  12/28: Pt seen for aquatic therapy today.  Treatment took place in water 3.25-4 ft in depth at the [facility] pool. Temp of water was 92.  Pt entered/exited the pool via steps with supervision, with bilat hand rail.  WC transport to/from pool and to/from  stairs at pool.   Therapist providing instruction from deck * walking with UE on yellow noodle x 4 laps forward, 2 backward * without UE support:  side stepping with side arm reach, cues for big steps * With UE on rainbow hand floats:  pushing floats up/down under water x 10 (tricep push downs) * side step with arm abdct/ add with rainbow hand floats * side step with reach - 2 laps *  Wall push up/off x 10 using core and ankle strategy * Hip wall bumps using hip strategy  x10  * LSVT-sideways rock at reach ( reach across midline) x 8 (irritated back); squat to reach up high x 6 * holding rainbow hand floats:  high knee marching forward/backward * forward step with reach (with unilateral hand float) and return to neutral x 10 each LE * stretches at steps:  bilat calf with heels off of step; side to side lunge for adductor stretch;  R/L hamstring stretch with foot on 2nd step x 2 reps of 10 sec each   Pt requires the buoyancy and hydrostatic pressure of water for support, and to offload joints by unweighting joint load by at least 50 % in navel deep water and by at least 75-80% in chest to neck deep water.  Viscosity of the water is needed for resistance of strengthening. Water current perturbations provides challenge to standing balance requiring increased core activation.  12/21: Pt seen for aquatic therapy today.  Treatment took place in water 3.25-4 ft in depth at the [facility] pool. Temp of water was 88.  Pt entered/exited the pool via steps SBA with bilat hand rail.  WC transport to/from pool and to/from stairs at pool.   Therapist  providing instruction while in water with patient (per pt request) * Without support: forward/backward walking, side stepping  * side stepping with arm abdct/ addct with rainbow hand floats * without UE support:  side stepping with side arm reach, cues for big steps/big reach ( improved cadence) * holding rainbow hand floats:  SLS with opp leg  swings in sagittal plane * single arm on wall with leg swings into abdct/ add (crossing midline) x 10 each (back began to hurt afterwards) * hip hinge with arm reach with kick board, cues to slow speed * tricep arm push downs with rainbow hand floats * return to walking forward/ side stepping unsupported  *  Wall push up/off x 10 using core and ankle strategy * Hip wall bumps using hip strategy x 10 * Without UE support:  forward and side step and reach with return to neutral after each direction  with single arm row with resistance bell - utilized mirroring for improved timing  * Perched on bench:  TUG like exercise working on increased speed with cues for big steps and upright posture x 4   Pt requires the buoyancy and hydrostatic pressure of water for support, and to offload joints by unweighting joint load by at least 50 % in navel deep water and by at least 75-80% in chest to neck deep water.  Viscosity of the water is needed for resistance of strengthening. Water current perturbations provides challenge to standing balance requiring increased core activation.  12/18 NuStep seat 7, UE #9: L3 ->2 at 3.5 min.  90 and 100 for 5.5 min, then 70-80 SPM x 2 min Row machine, cybex:  13.5#-18.5# x 10, x 15, 10 Chest press -0# x 10 LF Seated leg press 10#, x 10 x 10, cues for full range LF hip abdct 30# x 10, 15, 15 LF hip add 35# x 10, x 10 LF tricep press 25# x 10, 30# x 10  12/11 NuStep: Level 2. seat 6. handles setting 9. 5 minutes steps per minute between 80 and 100 Row machine 3x15 20lbs   Performed re-assessment on the patient. Reviewed test and measures and goals including strength testing, FOTO, and ROM   Reviewed technique with sit to stand transfers.  Practiced 3x.  Cueing to keep feet more underneath her and shift weight forward. Will perform correctly much easier time transferring sit to stand     PATIENT EDUCATION:  Education details:aquatic modifications Person  educated: Patient Education method: Explanation, Demonstration, Tactile cues, Verbal cues, and Handouts Education comprehension: verbalized understanding, returned demonstration, verbal cues required, tactile cues required, and needs further education   HOME EXERCISE PROGRAM:  has a program already; aquatic HEP TBD  ASSESSMENT:  CLINICAL IMPRESSION: Pt demonstrating increased shuffling in LEs with gait and exercises in pool.  Improved speed and form when pt following demonstration provided by therapist on deck for timing.  No report of increase in pain, just fatigue.  Pt is progressing gradually towards goals. Has one more visit prior to vacation out of state.     OBJECTIVE IMPAIRMENTS Abnormal gait, decreased activity tolerance, decreased balance, decreased mobility, difficulty walking, decreased strength, impaired tone, and pain.   ACTIVITY LIMITATIONS carrying, lifting, bending, standing, squatting, stairs, transfers, bed mobility, dressing, and locomotion level  PARTICIPATION LIMITATIONS: meal prep, cleaning, laundry, driving, shopping, community activity, and yard work  PERSONAL FACTORS Fibromyalgia; headaches, LBBB; osteopenia  are also affecting patient's functional outcome.   REHAB POTENTIAL: Good  CLINICAL DECISION MAKING: Evolving/moderate complexity  increasing pain in her quads   EVALUATION COMPLEXITY: Moderate   GOALS: Goals reviewed with patient? Yes  SHORT TERM GOALS: Target date: 07/13/2022 Patient will transfer sit to stand smoothly and independently  Baseline: Goal status: Worked on technique with sit to stand transfer today improving  2.  Patient will increase gross strength by 5lbs  Baseline:  Goal status: Met for most muscle groups 12/11  3.  Patient will be independent with base pool program  Baseline:  Goal status: Progressing well towards goals 12/11  LONG TERM GOALS: Target date: 10/03/2022  Patient will go improve balance to CGA for all balance  exercises to improve safety  Baseline:  Goal status: Improved to contact-guard today with static balance will progress towards more dynamic balance 12/11  2.  Patient will reach FOTO goal to demonstrate improved function  Baseline:  Goal status: Decreased Foto score but after review of answers initial photo was probably not answered correctly 12/11  3.  Patient will have a full balance program  Baseline:  Goal status: Will work more on the balance program as he progressed more to land 12/11   PLAN: PT FREQUENCY: 2x/week  PT DURATION: 8 weeks  PLANNED INTERVENTIONS: Therapeutic exercises, Therapeutic activity, Neuromuscular re-education, Balance training, Gait training, Patient/Family education, Self Care, Joint mobilization, Stair training, Aquatic Therapy, Cryotherapy, Moist heat, Taping, Manual therapy, and Re-evaluation  PLAN FOR NEXT SESSION: continue with gait and balance training in the pool. Be aware of bradycardia.  Continue with combination of land and pool exercises.  Continue with to progress land activity  Kerin Perna, PTA 09/02/22 12:48 PM [facility] [place], [place], [address] Phone: [phone]   Fax:  [phone]

## 2022-09-05 ENCOUNTER — Encounter (HOSPITAL_BASED_OUTPATIENT_CLINIC_OR_DEPARTMENT_OTHER): Payer: Self-pay | Admitting: Physical Therapy

## 2022-09-05 ENCOUNTER — Ambulatory Visit (HOSPITAL_BASED_OUTPATIENT_CLINIC_OR_DEPARTMENT_OTHER): Payer: Medicare Other | Admitting: Physical Therapy

## 2022-09-05 DIAGNOSIS — R2689 Other abnormalities of gait and mobility: Secondary | ICD-10-CM | POA: Diagnosis not present

## 2022-09-05 DIAGNOSIS — G20A1 Parkinson's disease without dyskinesia, without mention of fluctuations: Secondary | ICD-10-CM

## 2022-09-05 NOTE — Therapy (Signed)
OUTPATIENT PHYSICAL THERAPY LOWER EXTREMITY TREATMENT   Patient Name: Morgan [name] MRN: [id number] DOB:December 15, 1950, 72 y.o., female Today's Date: 09/05/2022   PT End of Session - 09/05/22 1123     Visit Number 17    Number of Visits 26    Date for PT Re-Evaluation 10/03/22    PT Start Time 1118    PT Stop Time 1149    PT Time Calculation (min) 31 min    Activity Tolerance Patient limited by fatigue    Behavior During Therapy WFL for tasks assessed/performed                  Past Medical History:  Diagnosis Date   Anxiety    Fibromyalgia    Headache    Hypercholesteremia    controlled on medication   Hyperlipidemia    Hypertension    no current meds   LBBB (left bundle branch block)    Melanoma (HCC)    MVP (mitral valve prolapse)    Osteopenia    Parkinson disease    Pneumonia    SVD (spontaneous vaginal delivery)    x 2   Past Surgical History:  Procedure Laterality Date   BLEPHAROPLASTY     cold knife conization     COLONOSCOPY     FH colon CA   CYSTOSCOPY W/ URETERAL STENT PLACEMENT Left 10/18/2021   Procedure: CYSTOSCOPY WITH RETROGRADE PYELOGRAM, URETERAL STENT PLACEMENT;  Surgeon: Festus Aloe, MD;  Location: [facility];  Service: Urology;  Laterality: Left;   DILATATION & CURETTAGE/HYSTEROSCOPY WITH MYOSURE N/A 02/14/2018   Procedure: ATTEMPTED DILATATION & CURETTAGE/HYSTEROSCOPY WITH MYOSURE;  Surgeon: Christophe Louis, MD;  Location: [facility];  Service: Gynecology;  Laterality: N/A;  polyp   melanoma cancer     removed from left leg   RECONSTRUCTION OF EYELID     TONSILLECTOMY     TUBAL LIGATION     URETEROSCOPY WITH HOLMIUM LASER LITHOTRIPSY Left 12/17/2021   Procedure: URETEROSCOPY/HOLMIUM LASER/STENT EXCHANGE;  Surgeon: Festus Aloe, MD;  Location: [facility];  Service: Urology;  Laterality: Left;   WISDOM TOOTH EXTRACTION     Patient Active Problem List   Diagnosis Date Noted   COVID-19 virus infection 05/16/2022   HTN (hypertension)  10/20/2021   Physical deconditioning 10/20/2021   Hydronephrosis with infection 10/18/2021   Bacteremia due to Escherichia coli 10/18/2021   Severe sepsis ([facility]) 10/17/2021   UTI (urinary tract infection) 40/81/4481   Acute metabolic encephalopathy 85/63/1497   Parkinson disease 10/17/2021   Cervical stenosis (uterine cervix) 02/14/2018   Postmenopausal bleeding 02/14/2018   LBBB (left bundle branch block) 12/25/2015   PVCs (premature ventricular contractions) 12/25/2015   Hyperlipidemia 12/25/2015   Fibromyalgia 01/16/2014   Cholelithiasis 04/25/2013   Paralysis agitans 01/15/2013   Unspecified hereditary and idiopathic peripheral neuropathy 01/15/2013   [name] MD PCP:   Loistine Chance PROVIDER: Hulan Saas MD   REFERRING DIAG: Parkinsons   THERAPY DIAG:  Other abnormalities of gait and mobility  Parkinson's disease without dyskinesia or fluctuating manifestations  Rationale for Evaluation and Treatment Rehabilitation  ONSET DATE: Several years   SUBJECTIVE:   SUBJECTIVE STATEMENT: Pt reports she is looking forward to her vacation.   No further falls since last visit.    From EVAL: The patient has had Parkinsons for many years. She has had land therapy with varying degrees of success. She has recently had an onset of bilateral leg pain that comes and goes. The pain is mostly in the front but  can go down to the back as well. She is a member of the gym. She has frequent falls.   PERTINENT HISTORY: Fibromyalgia; headaches, LBBB; osteopenia   PAIN:  Are you having pain? yes: NPRS scale:3/10 Pain location: "everywhere" Pain description: sore Aggravating factors: just comes and goes  Relieving factors: just comes and goes; sometimes ibuprofin works but she is limited as to how much she can take  PRECAUTIONS: Patient reports that because of her heart she will pass out at times.   WEIGHT BEARING RESTRICTIONS No  FALLS:  Has patient fallen in last 6 months? Yes.  Number of falls 3-4 most recently slipped on something in the closet and fell face first    LIVING ENVIRONMENT: Stairs to the second floor in her house but she dosen't do much  4 steps in the front  2 steps in the garage  OCCUPATION: retired   Office manager: reading; goes to an exercise class   PLOF: Independent with household mobility with device  PATIENT GOALS  To have less pain in her legs and to move better    OBJECTIVE: * findings taken at evaluation, unless otherwise noted.   DIAGNOSTIC FINDINGS:   PATIENT SURVEYS:  FOTO    COGNITION:  Overall cognitive status: Within functional limits for tasks assessed     SENSATION: Denies paresthesias   POSTURE: No Significant postural limitations  PALPATION: No trigger points noted   LOWER EXTREMITY ROM:  Passive ROM Right eval Left eval  Hip flexion    Hip extension    Hip abduction    Hip adduction    Hip internal rotation    Hip external rotation    Knee flexion    Knee extension    Ankle dorsiflexion    Ankle plantarflexion    Ankle inversion    Ankle eversion     (Blank rows = not tested)  WNL  LOWER EXTREMITY MMT:  MMT Right eval Left eval right left  Hip flexion 21.7 29.7 29.6 35.1  Hip extension      Hip abduction 27.6 18.5 20.7 22.4  Hip adduction      Hip internal rotation      Hip external rotation      Knee flexion      Knee extension 23.5 20.2 27.4 25.5  Ankle dorsiflexion      Ankle plantarflexion      Ankle inversion      Ankle eversion       (Blank rows = not tested)  GAIT: Decreased hip flexion; leans to the left; right hip frop. Patient required CGA while walking and min a when she turned for balance.   Functional tests:  Sit to stand: uses's hands and needs several trials to stand from the table. Poor intial standing balance   12/11 5x sit to stand: 20 seconds   BALANCE: 08/08/22 Narrow base min A  Narrow base eyes closed min to mod A  Tandem stance Min A both ways.    TODAY'S TREATMENT: 09/05/22:  CGA provided for safe gait between machines NuStep seat 7, UE #9: L2 (90-100SPM)- 5 min total Cybex lat pull down- 10# x 15, x 2 Cybex Row machine: (chest pad at 3) 13.5#  x 10 x 2 Cybex chest press-  0# x 10 - (should break into 2 sets of 5 next time) Cybex tricep ext - 0# x 10 x 2 LF Seated leg press 0#, x 10 x 2, cues for full range LF hip abdct 35#  x  20 x2   1/5: Pt seen for aquatic therapy today.  Treatment took place in water 3.25-4 ft in depth at the [facility] pool. Temp of water was 92.  Pt entered/exited the pool via steps with supervision, with bilat hand rail.  WC transport to/from pool and to/from stairs at pool.   Therapist providing instruction from deck * walking without UE support x 4 laps forward/backward *  side stepping with side arm reach, cues for big steps x 2 laps; additional lap holding blue hand float  * high knee marching * forward step with reach (with unilateral hand float) and return to neutral x 10 each LE *  Wall push up/off x 10 using core and ankle strategy (demo/cues for improved speed and form) * LSVT sideways rock and reach (crossing midline at shoulder height) x 10; repeated side to side weight shift with single overhead reach x 10 * Hip wall bumps using hip strategy  x10  * side to side lunge near wall * STS holding yellow noodle with minA from therapist holding noodle x 3; pt with posterior lean despite cues - changed exercises * TUG like exercise x 4  floats:  high knee marching forward/back * stretches at steps:  bilat calf with heels off of step;  R/L hamstring stretch with foot on 2nd step x 2 reps of 10 sec each; L stretch x 10s x 2  08/31/22:  CGA for safe gait between machines NuStep seat 7, UE #9: L3 x 2 min at 70 SPM, L1 x 3 min at 45 SPM - 5 min total Cybex lat pull down- 10# x 15, x 2 Cybex chest press- 5# x 5,  0# x 10 Cybex Row machine:  13.5#  x 10 x 2 LF hip abdct 35# x  15, 15 LF hip  add 35# x 15, x 10 LF Seated leg press 5#, x 10 x 2, cues for full range  12/28: Pt seen for aquatic therapy today.  Treatment took place in water 3.25-4 ft in depth at the [facility] pool. Temp of water was 92.  Pt entered/exited the pool via steps with supervision, with bilat hand rail.  WC transport to/from pool and to/from stairs at pool.   Therapist providing instruction from deck * walking with UE on yellow noodle x 4 laps forward, 2 backward * without UE support:  side stepping with side arm reach, cues for big steps * With UE on rainbow hand floats:  pushing floats up/down under water x 10 (tricep push downs) * side step with arm abdct/ add with rainbow hand floats * side step with reach - 2 laps *  Wall push up/off x 10 using core and ankle strategy * Hip wall bumps using hip strategy  x10  * LSVT-sideways rock at reach ( reach across midline) x 8 (irritated back); squat to reach up high x 6 * holding rainbow hand floats:  high knee marching forward/backward * forward step with reach (with unilateral hand float) and return to neutral x 10 each LE * stretches at steps:  bilat calf with heels off of step; side to side lunge for adductor stretch;  R/L hamstring stretch with foot on 2nd step x 2 reps of 10 sec each   Pt requires the buoyancy and hydrostatic pressure of water for support, and to offload joints by unweighting joint load by at least 50 % in navel deep water and by at least 75-80% in chest to  neck deep water.  Viscosity of the water is needed for resistance of strengthening. Water current perturbations provides challenge to standing balance requiring increased core activation.  12/21: Pt seen for aquatic therapy today.  Treatment took place in water 3.25-4 ft in depth at the [facility] pool. Temp of water was 88.  Pt entered/exited the pool via steps SBA with bilat hand rail.  WC transport to/from pool and to/from stairs at pool.   Therapist providing  instruction while in water with patient (per pt request) * Without support: forward/backward walking, side stepping  * side stepping with arm abdct/ addct with rainbow hand floats * without UE support:  side stepping with side arm reach, cues for big steps/big reach ( improved cadence) * holding rainbow hand floats:  SLS with opp leg swings in sagittal plane * single arm on wall with leg swings into abdct/ add (crossing midline) x 10 each (back began to hurt afterwards) * hip hinge with arm reach with kick board, cues to slow speed * tricep arm push downs with rainbow hand floats * return to walking forward/ side stepping unsupported  *  Wall push up/off x 10 using core and ankle strategy * Hip wall bumps using hip strategy x 10 * Without UE support:  forward and side step and reach with return to neutral after each direction  with single arm row with resistance bell - utilized mirroring for improved timing  * Perched on bench:  TUG like exercise working on increased speed with cues for big steps and upright posture x 4   Pt requires the buoyancy and hydrostatic pressure of water for support, and to offload joints by unweighting joint load by at least 50 % in navel deep water and by at least 75-80% in chest to neck deep water.  Viscosity of the water is needed for resistance of strengthening. Water current perturbations provides challenge to standing balance requiring increased core activation.  12/18 NuStep seat 7, UE #9: L3 ->2 at 3.5 min.  90 and 100 for 5.5 min, then 70-80 SPM x 2 min Row machine, cybex:  13.5#-18.5# x 10, x 15, 10 Chest press -0# x 10 LF Seated leg press 10#, x 10 x 10, cues for full range LF hip abdct 30# x 10, 15, 15 LF hip add 35# x 10, x 10 LF tricep press 25# x 10, 30# x 10  12/11 NuStep: Level 2. seat 6. handles setting 9. 5 minutes steps per minute between 80 and 100 Row machine 3x15 20lbs   Performed re-assessment on the patient. Reviewed test and measures  and goals including strength testing, FOTO, and ROM   Reviewed technique with sit to stand transfers.  Practiced 3x.  Cueing to keep feet more underneath her and shift weight forward. Will perform correctly much easier time transferring sit to stand     PATIENT EDUCATION:  Education details: modifications to wts on machines; protection of joints by controlling motion / speed when using wt machines. Person educated: Patient Education method: Explanation, Demonstration, Tactile cues, Verbal cues, and Handouts Education comprehension: verbalized understanding, returned demonstration, verbal cues required, tactile cues required, and needs further education   HOME EXERCISE PROGRAM:  has a program already; aquatic HEP TBD  ASSESSMENT:  CLINICAL IMPRESSION: Pt able to complete 5 min at L2 on NuStep at speed of 100 SPM without need for rest break/change in resistance.  She requires cues to slow speed of lifting with weight machines to protect joints and  to allow for full ROM.  With cues for bigger steps during gait between machines, she demonstrates decreased shuffling in LEs.  No report of increase in pain, just fatigue. Pt requests to be finished at ~31 min due to fatigue.  Pt is progressing gradually towards goals. Pt will be out of state on vacation until next scheduled visit.  PT to assess goals and progress then (POC ends 10/03/22).     OBJECTIVE IMPAIRMENTS Abnormal gait, decreased activity tolerance, decreased balance, decreased mobility, difficulty walking, decreased strength, impaired tone, and pain.   ACTIVITY LIMITATIONS carrying, lifting, bending, standing, squatting, stairs, transfers, bed mobility, dressing, and locomotion level  PARTICIPATION LIMITATIONS: meal prep, cleaning, laundry, driving, shopping, community activity, and yard work  PERSONAL FACTORS Fibromyalgia; headaches, LBBB; osteopenia  are also affecting patient's functional outcome.   REHAB POTENTIAL:  Good  CLINICAL DECISION MAKING: Evolving/moderate complexity increasing pain in her quads   EVALUATION COMPLEXITY: Moderate   GOALS: Goals reviewed with patient? Yes  SHORT TERM GOALS: Target date: 07/13/2022 Patient will transfer sit to stand smoothly and independently  Baseline: Goal status: Worked on technique with sit to stand transfer today improving  2.  Patient will increase gross strength by 5lbs  Baseline:  Goal status: Met for most muscle groups 12/11  3.  Patient will be independent with base pool program  Baseline:  Goal status: Progressing well towards goals 12/11  LONG TERM GOALS: Target date: 10/03/2022  Patient will go improve balance to CGA for all balance exercises to improve safety  Baseline:  Goal status: Improved to contact-guard today with static balance will progress towards more dynamic balance 12/11  2.  Patient will reach FOTO goal to demonstrate improved function  Baseline:  Goal status: Decreased Foto score but after review of answers initial photo was probably not answered correctly 12/11  3.  Patient will have a full balance program  Baseline:  Goal status: Will work more on the balance program as he progressed more to land 12/11   PLAN: PT FREQUENCY: 2x/week  PT DURATION: 8 weeks  PLANNED INTERVENTIONS: Therapeutic exercises, Therapeutic activity, Neuromuscular re-education, Balance training, Gait training, Patient/Family education, Self Care, Joint mobilization, Stair training, Aquatic Therapy, Cryotherapy, Moist heat, Taping, Manual therapy, and Re-evaluation  PLAN FOR NEXT SESSION: continue with gait and balance training in the pool. Be aware of bradycardia.  Continue with combination of land and pool exercises.  Continue with to progress land activity.  Kerin Perna, PTA 09/05/22 12:12 PM [facility] [address] [place], [place], [address] Phone: [phone]   Fax:   [phone]

## 2022-09-07 ENCOUNTER — Ambulatory Visit (HOSPITAL_BASED_OUTPATIENT_CLINIC_OR_DEPARTMENT_OTHER): Payer: Medicare Other | Admitting: Physical Therapy

## 2022-09-08 ENCOUNTER — Ambulatory Visit (HOSPITAL_BASED_OUTPATIENT_CLINIC_OR_DEPARTMENT_OTHER): Payer: Medicare Other | Admitting: Physical Therapy

## 2022-09-09 DIAGNOSIS — N3000 Acute cystitis without hematuria: Secondary | ICD-10-CM | POA: Diagnosis not present

## 2022-09-09 DIAGNOSIS — R051 Acute cough: Secondary | ICD-10-CM | POA: Diagnosis not present

## 2022-10-03 ENCOUNTER — Ambulatory Visit (HOSPITAL_BASED_OUTPATIENT_CLINIC_OR_DEPARTMENT_OTHER): Payer: Medicare Other | Attending: Family Medicine | Admitting: Physical Therapy

## 2022-10-03 DIAGNOSIS — R2689 Other abnormalities of gait and mobility: Secondary | ICD-10-CM | POA: Insufficient documentation

## 2022-10-03 DIAGNOSIS — G20A1 Parkinson's disease without dyskinesia, without mention of fluctuations: Secondary | ICD-10-CM | POA: Diagnosis not present

## 2022-10-03 NOTE — Therapy (Signed)
OUTPATIENT PHYSICAL THERAPY LOWER EXTREMITY TREATMENT   Patient Name: Morgan [name] MRN: [id number] DOB:Dec 14, 1950, 72 y.o., female Today's Date: 09/05/2022   PT End of Session - 09/05/22 1123     Visit Number 17    Number of Visits 26    Date for PT Re-Evaluation 10/03/22    PT Start Time 1118    PT Stop Time 1149    PT Time Calculation (min) 31 min    Activity Tolerance Patient limited by fatigue    Behavior During Therapy WFL for tasks assessed/performed                  Past Medical History:  Diagnosis Date   Anxiety    Fibromyalgia    Headache    Hypercholesteremia    controlled on medication   Hyperlipidemia    Hypertension    no current meds   LBBB (left bundle branch block)    Melanoma (HCC)    MVP (mitral valve prolapse)    Osteopenia    Parkinson disease    Pneumonia    SVD (spontaneous vaginal delivery)    x 2   Past Surgical History:  Procedure Laterality Date   BLEPHAROPLASTY     cold knife conization     COLONOSCOPY     FH colon CA   CYSTOSCOPY W/ URETERAL STENT PLACEMENT Left 10/18/2021   Procedure: CYSTOSCOPY WITH RETROGRADE PYELOGRAM, URETERAL STENT PLACEMENT;  Surgeon: Festus Aloe, MD;  Location: [facility];  Service: Urology;  Laterality: Left;   DILATATION & CURETTAGE/HYSTEROSCOPY WITH MYOSURE N/A 02/14/2018   Procedure: ATTEMPTED DILATATION & CURETTAGE/HYSTEROSCOPY WITH MYOSURE;  Surgeon: Christophe Louis, MD;  Location: [facility];  Service: Gynecology;  Laterality: N/A;  polyp   melanoma cancer     removed from left leg   RECONSTRUCTION OF EYELID     TONSILLECTOMY     TUBAL LIGATION     URETEROSCOPY WITH HOLMIUM LASER LITHOTRIPSY Left 12/17/2021   Procedure: URETEROSCOPY/HOLMIUM LASER/STENT EXCHANGE;  Surgeon: Festus Aloe, MD;  Location: [facility];  Service: Urology;  Laterality: Left;   WISDOM TOOTH EXTRACTION     Patient Active Problem List   Diagnosis Date Noted   COVID-19 virus infection 05/16/2022   HTN (hypertension)  10/20/2021   Physical deconditioning 10/20/2021   Hydronephrosis with infection 10/18/2021   Bacteremia due to Escherichia coli 10/18/2021   Severe sepsis ([place]) 10/17/2021   UTI (urinary tract infection) 38/46/6599   Acute metabolic encephalopathy 35/70/1779   Parkinson disease 10/17/2021   Cervical stenosis (uterine cervix) 02/14/2018   Postmenopausal bleeding 02/14/2018   LBBB (left bundle branch block) 12/25/2015   PVCs (premature ventricular contractions) 12/25/2015   Hyperlipidemia 12/25/2015   Fibromyalgia 01/16/2014   Cholelithiasis 04/25/2013   Paralysis agitans 01/15/2013   Unspecified hereditary and idiopathic peripheral neuropathy 01/15/2013   [name] MD PCP:   Loistine Chance PROVIDER: Hulan Saas MD   REFERRING DIAG: Parkinsons   THERAPY DIAG:  Other abnormalities of gait and mobility  Parkinson's disease without dyskinesia or fluctuating manifestations  Rationale for Evaluation and Treatment Rehabilitation  ONSET DATE: Several years   SUBJECTIVE:   SUBJECTIVE STATEMENT: Pt reports she is looking forward to her vacation.   No further falls since last visit.    From EVAL: The patient has had Parkinsons for many years. She has had land therapy with varying degrees of success. She has recently had an onset of bilateral leg pain that comes and goes. The pain is mostly in the front but  can go down to the back as well. She is a member of the gym. She has frequent falls.   PERTINENT HISTORY: Fibromyalgia; headaches, LBBB; osteopenia   PAIN:  Are you having pain? yes: NPRS scale:3/10 Pain location: "everywhere" Pain description: sore Aggravating factors: just comes and goes  Relieving factors: just comes and goes; sometimes ibuprofin works but she is limited as to how much she can take  PRECAUTIONS: Patient reports that because of her heart she will pass out at times.   WEIGHT BEARING RESTRICTIONS No  FALLS:  Has patient fallen in last 6 months? Yes.  Number of falls 3-4 most recently slipped on something in the closet and fell face first    LIVING ENVIRONMENT: Stairs to the second floor in her house but she dosen't do much  4 steps in the front  2 steps in the garage  OCCUPATION: retired   Office manager: reading; goes to an exercise class   PLOF: Independent with household mobility with device  PATIENT GOALS  To have less pain in her legs and to move better    OBJECTIVE: * findings taken at evaluation, unless otherwise noted.   DIAGNOSTIC FINDINGS:   PATIENT SURVEYS:  FOTO    COGNITION:  Overall cognitive status: Within functional limits for tasks assessed     SENSATION: Denies paresthesias   POSTURE: No Significant postural limitations  PALPATION: No trigger points noted   LOWER EXTREMITY ROM:  Passive ROM Right eval Left eval  Hip flexion    Hip extension    Hip abduction    Hip adduction    Hip internal rotation    Hip external rotation    Knee flexion    Knee extension    Ankle dorsiflexion    Ankle plantarflexion    Ankle inversion    Ankle eversion     (Blank rows = not tested)  WNL  LOWER EXTREMITY MMT:  MMT Right eval Left eval right left Right  2/5 Left  2/5  Hip flexion 21.7 29.7 29.6 35.1 29.3 32.1  Hip extension        Hip abduction 27.6 18.5 20.7 22.4 24.1 34.2  Hip adduction        Hip internal rotation        Hip external rotation        Knee flexion        Knee extension 23.5 20.2 27.4 25.5 32.5 26.7  Ankle dorsiflexion        Ankle plantarflexion        Ankle inversion        Ankle eversion         (Blank rows = not tested)  GAIT: Decreased hip flexion; leans to the left; right hip frop. Patient required CGA while walking and min a when she turned for balance.   Functional tests:  Sit to stand: uses's hands and needs several trials to stand from the table. Poor intial standing balance 2/5 poor psoterior balance upon initial standing   12/11 5x sit to stand: 20 seconds   2/5 11 seconds with guarding for posterior balance    2/5  BALANCE: 08/08/22 Narrow base min A  Narrow base eyes closed min to mod A  Tandem stance Min A both ways.   2/5 Balance   TODAY'S TREATMENT: 2/5 Nu-step: 5 min L4 seat 7  #1 Life Fitness 25 lbs 3x15  Knee extension life fitness 15 lbs 3x15  Hip abduction life fitness 55 3x15  Reviewed set up of gym equipment and how to get on ana doff machines   Performed re-assessment on the patient. Reviewed test and measures and goals including strength testing, FOTO, and ROM       09/05/22:  CGA provided for safe gait between machines NuStep seat 7, UE #9: L2 (90-100SPM)- 5 min total Cybex lat pull down- 10# x 15, x 2 Cybex Row machine: (chest pad at 3) 13.5#  x 10 x 2 Cybex chest press-  0# x 10 - (should break into 2 sets of 5 next time) Cybex tricep ext - 0# x 10 x 2 LF Seated leg press 0#, x 10 x 2, cues for full range LF hip abdct 35# x  20 x2     PATIENT EDUCATION:  Education details: modifications to wts on machines; protection of joints by controlling motion / speed when using wt machines. Person educated: Patient Education method: Explanation, Demonstration, Tactile cues, Verbal cues, and Handouts Education comprehension: verbalized understanding, returned demonstration, verbal cues required, tactile cues required, and needs further education   HOME EXERCISE PROGRAM:  has a program already; aquatic HEP TBD  ASSESSMENT:  CLINICAL IMPRESSION: Despite sickness and being on vacation for a month, the patients numbers have improved. She required less assist for balance, her sit to stand time decreased, and her strnegth numbers have improved. She continues to have some difficulty with initiation of movement and initial balance upon standing. She required guarding for sit to stand test. We reviewed gym exercises. She will continue to require assist with gym exercises. We will continue 1W6 to work on balance exercises  and getting safely on and off the equipment.    OBJECTIVE IMPAIRMENTS Abnormal gait, decreased activity tolerance, decreased balance, decreased mobility, difficulty walking, decreased strength, impaired tone, and pain.   ACTIVITY LIMITATIONS carrying, lifting, bending, standing, squatting, stairs, transfers, bed mobility, dressing, and locomotion level  PARTICIPATION LIMITATIONS: meal prep, cleaning, laundry, driving, shopping, community activity, and yard work  PERSONAL FACTORS Fibromyalgia; headaches, LBBB; osteopenia  are also affecting patient's functional outcome.   REHAB POTENTIAL: Good  CLINICAL DECISION MAKING: Evolving/moderate complexity increasing pain in her quads   EVALUATION COMPLEXITY: Moderate   GOALS: Goals reviewed with patient? Yes  SHORT TERM GOALS: Target date: 07/13/2022 Patient will transfer sit to stand smoothly and independently  Baseline: Goal status: Worked on technique with sit to stand transfer today improving  2.  Patient will increase gross strength by 5lbs  Baseline:  Goal status: Met for most muscle groups 12/11  3.  Patient will be independent with base pool program  Baseline:  Goal status: Progressing well towards goals 12/11  LONG TERM GOALS: Target date: 10/03/2022  Patient will go improve balance to CGA for all balance exercises to improve safety  Baseline:  Goal status: Improved to contact-guard today with static balance will progress towards more dynamic balance 12/11  2.  Patient will reach FOTO goal to demonstrate improved function  Baseline:  Goal status: Decreased Foto score but after review of answers initial photo was probably not answered correctly 12/11  3.  Patient will have a full balance program  Baseline:  Goal status: Will work more on the balance program as he progressed more to land 12/11   PLAN: PT FREQUENCY: 2x/week  PT DURATION: 8 weeks  PLANNED INTERVENTIONS: Therapeutic exercises, Therapeutic activity,  Neuromuscular re-education, Balance training, Gait training, Patient/Family education, Self Care, Joint mobilization, Stair training, Aquatic Therapy, Cryotherapy, Moist heat, Taping, Manual therapy, and Re-evaluation  PLAN FOR NEXT SESSION: continue with gait and balance training in the pool. Be aware of bradycardia.  Continue with combination of land and pool exercises.  Continue with to progress land activity.  Carolyne Littles PT DPT  09/05/22 12:12 PM [facility] [address] [place], [place], [address] Phone: [phone]   Fax:  [phone]

## 2022-10-04 ENCOUNTER — Encounter (HOSPITAL_BASED_OUTPATIENT_CLINIC_OR_DEPARTMENT_OTHER): Payer: Self-pay | Admitting: Physical Therapy

## 2022-10-06 ENCOUNTER — Encounter (HOSPITAL_BASED_OUTPATIENT_CLINIC_OR_DEPARTMENT_OTHER): Payer: Self-pay | Admitting: Physical Therapy

## 2022-10-06 ENCOUNTER — Ambulatory Visit (HOSPITAL_BASED_OUTPATIENT_CLINIC_OR_DEPARTMENT_OTHER): Payer: Medicare Other | Admitting: Physical Therapy

## 2022-10-06 DIAGNOSIS — G20A1 Parkinson's disease without dyskinesia, without mention of fluctuations: Secondary | ICD-10-CM

## 2022-10-06 DIAGNOSIS — R2689 Other abnormalities of gait and mobility: Secondary | ICD-10-CM

## 2022-10-06 NOTE — Therapy (Signed)
OUTPATIENT PHYSICAL THERAPY LOWER EXTREMITY TREATMENT   Patient Name: Morgan [name] MRN: [id number] DOB:01-27-1951, 72 y.o., female Today's Date: 10/06/2022   PT End of Session - 10/06/22 1127     Visit Number 19    Number of Visits 30    Date for PT Re-Evaluation 11/14/22    Authorization Type progress note done on visit 18    PT Start Time 1120    PT Stop Time 1143    PT Time Calculation (min) 23 min    Activity Tolerance Patient limited by fatigue    Behavior During Therapy WFL for tasks assessed/performed                  Past Medical History:  Diagnosis Date   Anxiety    Fibromyalgia    Headache    Hypercholesteremia    controlled on medication   Hyperlipidemia    Hypertension    no current meds   LBBB (left bundle branch block)    Melanoma (HCC)    MVP (mitral valve prolapse)    Osteopenia    Parkinson disease    Pneumonia    SVD (spontaneous vaginal delivery)    x 2   Past Surgical History:  Procedure Laterality Date   BLEPHAROPLASTY     cold knife conization     COLONOSCOPY     FH colon CA   CYSTOSCOPY W/ URETERAL STENT PLACEMENT Left 10/18/2021   Procedure: CYSTOSCOPY WITH RETROGRADE PYELOGRAM, URETERAL STENT PLACEMENT;  Surgeon: Festus Aloe, MD;  Location: [facility];  Service: Urology;  Laterality: Left;   DILATATION & CURETTAGE/HYSTEROSCOPY WITH MYOSURE N/A 02/14/2018   Procedure: ATTEMPTED DILATATION & CURETTAGE/HYSTEROSCOPY WITH MYOSURE;  Surgeon: Christophe Louis, MD;  Location: [facility];  Service: Gynecology;  Laterality: N/A;  polyp   melanoma cancer     removed from left leg   RECONSTRUCTION OF EYELID     TONSILLECTOMY     TUBAL LIGATION     URETEROSCOPY WITH HOLMIUM LASER LITHOTRIPSY Left 12/17/2021   Procedure: URETEROSCOPY/HOLMIUM LASER/STENT EXCHANGE;  Surgeon: Festus Aloe, MD;  Location: [facility];  Service: Urology;  Laterality: Left;   WISDOM TOOTH EXTRACTION     Patient Active Problem List   Diagnosis Date Noted   COVID-19  virus infection 05/16/2022   HTN (hypertension) 10/20/2021   Physical deconditioning 10/20/2021   Hydronephrosis with infection 10/18/2021   Bacteremia due to Escherichia coli 10/18/2021   Severe sepsis ([place]) 10/17/2021   UTI (urinary tract infection) A999333   Acute metabolic encephalopathy A999333   Parkinson disease 10/17/2021   Cervical stenosis (uterine cervix) 02/14/2018   Postmenopausal bleeding 02/14/2018   LBBB (left bundle branch block) 12/25/2015   PVCs (premature ventricular contractions) 12/25/2015   Hyperlipidemia 12/25/2015   Fibromyalgia 01/16/2014   Cholelithiasis 04/25/2013   Paralysis agitans 01/15/2013   Unspecified hereditary and idiopathic peripheral neuropathy 01/15/2013   [name] MD PCP:   Loistine Chance PROVIDER: Hulan Saas MD   REFERRING DIAG: Parkinsons   THERAPY DIAG:  Other abnormalities of gait and mobility  Parkinson's disease without dyskinesia or fluctuating manifestations  Rationale for Evaluation and Treatment Rehabilitation  ONSET DATE: Several years   SUBJECTIVE:   SUBJECTIVE STATEMENT: Pt reports she does not feel as strong as she was when last at therapy before vacation.  She verbalizes desire to do land visits instead of aquatic therapy.     From EVAL: The patient has had Parkinsons for many years. She has had land therapy with varying degrees  of success. She has recently had an onset of bilateral leg pain that comes and goes. The pain is mostly in the front but can go down to the back as well. She is a member of the gym. She has frequent falls.   PERTINENT HISTORY: Fibromyalgia; headaches, LBBB; osteopenia   PAIN:  Are you having pain? yes: NPRS scale:4/10 Pain location: LEs Pain description: sore Aggravating factors: just comes and goes  Relieving factors: just comes and goes; sometimes ibuprofin works but she is limited as to how much she can take  PRECAUTIONS: Patient reports that because of her heart she will  pass out at times.   WEIGHT BEARING RESTRICTIONS No  FALLS:  Has patient fallen in last 6 months? Yes. Number of falls 3-4 most recently slipped on something in the closet and fell face first    LIVING ENVIRONMENT: Stairs to the second floor in her house but she dosen't do much  4 steps in the front  2 steps in the garage  OCCUPATION: retired   Office manager: reading; goes to an exercise class   PLOF: Independent with household mobility with device  PATIENT GOALS  To have less pain in her legs and to move better    OBJECTIVE: * findings taken at evaluation, unless otherwise noted.   DIAGNOSTIC FINDINGS:   PATIENT SURVEYS:  FOTO    COGNITION:  Overall cognitive status: Within functional limits for tasks assessed     SENSATION: Denies paresthesias   POSTURE: No Significant postural limitations  PALPATION: No trigger points noted   LOWER EXTREMITY ROM:  Passive ROM Right eval Left eval  Hip flexion    Hip extension    Hip abduction    Hip adduction    Hip internal rotation    Hip external rotation    Knee flexion    Knee extension    Ankle dorsiflexion    Ankle plantarflexion    Ankle inversion    Ankle eversion     (Blank rows = not tested)  WNL  LOWER EXTREMITY MMT:  MMT Right eval Left eval right left Right  2/5 Left  2/5  Hip flexion 21.7 29.7 29.6 35.1 29.3 32.1  Hip extension        Hip abduction 27.6 18.5 20.7 22.4 24.1 34.2  Hip adduction        Hip internal rotation        Hip external rotation        Knee flexion        Knee extension 23.5 20.2 27.4 25.5 32.5 26.7  Ankle dorsiflexion        Ankle plantarflexion        Ankle inversion        Ankle eversion         (Blank rows = not tested)  GAIT: Decreased hip flexion; leans to the left; right hip frop. Patient required CGA while walking and min a when she turned for balance.   Functional tests:  Sit to stand: uses's hands and needs several trials to stand from the table. Poor  intial standing balance 2/5 poor psoterior balance upon initial standing   12/11 5x sit to stand: 20 seconds  2/5 11 seconds with guarding for posterior balance    2/5  BALANCE: 08/08/22 Narrow base min A  Narrow base eyes closed min to mod A  Tandem stance Min A both ways.   2/5 Balance   TODAY'S TREATMENT: 2/8 Pt seen for aquatic therapy today.  Treatment took place in water 3.25-4.5 ft in depth at the [facility]. Temp of water was 91.  Pt entered/exited the pool via stairs with supervision with bilat rail.  * holding white barbell - forward/ backward gait, side stepping - cues for increased step length * holding yellow hand float:  forward/backward step; side lunge and return to neutral with single arm reach * holding wall:  squats 5, heel raises x 5 * seated on bench - STS with CGA/min A x 5, unsteady * return to walking forward/ backward without UE support  * squat to raise arms overhead x 5  Pt requires the buoyancy and hydrostatic pressure of water for support, and to offload joints by unweighting joint load by at least 50 % in navel deep water and by at least 75-80% in chest to neck deep water.  Viscosity of the water is needed for resistance of strengthening. Water current perturbations provides challenge to standing balance requiring increased core activation.  2/5 Nu-step: 5 min L4 seat 7  #1 Life Fitness 25 lbs 3x15  Knee extension life fitness 15 lbs 3x15  Hip abduction life fitness 55 3x15   Reviewed set up of gym equipment and how to get on ana doff machines   Performed re-assessment on the patient. Reviewed test and measures and goals including strength testing, FOTO, and ROM   09/05/22:  CGA provided for safe gait between machines NuStep seat 7, UE #9: L2 (90-100SPM)- 5 min total Cybex lat pull down- 10# x 15, x 2 Cybex Row machine: (chest pad at 3) 13.5#  x 10 x 2 Cybex chest press-  0# x 10 - (should break into 2 sets of 5 next  time) Cybex tricep ext - 0# x 10 x 2 LF Seated leg press 0#, x 10 x 2, cues for full range LF hip abdct 35# x  20 x2     PATIENT EDUCATION:  Education details: modifications to wts on machines; protection of joints by controlling motion / speed when using wt machines. Person educated: Patient Education method: Explanation, Demonstration, Tactile cues, Verbal cues, and Handouts Education comprehension: verbalized understanding, returned demonstration, verbal cues required, tactile cues required, and needs further education   HOME EXERCISE PROGRAM:  has a program already; aquatic HEP TBD  ASSESSMENT:  CLINICAL IMPRESSION: Pt had loss of balance at last step in water, but was able to return to upright with UE on rails.  Pt initially given barbell for balance when getting reoriented in water, but ended session ambulating in water without any UE support.  Able to take direction from therapist on deck, providing demonstration for improved pacing and coordination. Pt requests to end session early due to this fatigue.   She will continue to require assist with gym exercises. We will continue 1W6 to work on balance exercises and getting safely on and off the equipment. Goals are ongoing.    OBJECTIVE IMPAIRMENTS Abnormal gait, decreased activity tolerance, decreased balance, decreased mobility, difficulty walking, decreased strength, impaired tone, and pain.   ACTIVITY LIMITATIONS carrying, lifting, bending, standing, squatting, stairs, transfers, bed mobility, dressing, and locomotion level  PARTICIPATION LIMITATIONS: meal prep, cleaning, laundry, driving, shopping, community activity, and yard work  PERSONAL FACTORS Fibromyalgia; headaches, LBBB; osteopenia  are also affecting patient's functional outcome.   REHAB POTENTIAL: Good  CLINICAL DECISION MAKING: Evolving/moderate complexity increasing pain in her quads   EVALUATION COMPLEXITY: Moderate   GOALS: Goals reviewed with patient?  Yes  SHORT TERM GOALS: Target  date: 07/13/2022 Patient will transfer sit to stand smoothly and independently  Baseline: Goal status: Worked on technique with sit to stand transfer today improving  2.  Patient will increase gross strength by 5lbs  Baseline:  Goal status: Met for most muscle groups 12/11  3.  Patient will be independent with base pool program  Baseline:  Goal status: Progressing well towards goals 12/11  LONG TERM GOALS: Target date: 10/03/2022  Patient will go improve balance to CGA for all balance exercises to improve safety  Baseline:  Goal status: Improved to contact-guard today with static balance will progress towards more dynamic balance 12/11  2.  Patient will reach FOTO goal to demonstrate improved function  Baseline:  Goal status: Decreased Foto score but after review of answers initial photo was probably not answered correctly 12/11  3.  Patient will have a full balance program  Baseline:  Goal status: Will work more on the balance program as he progressed more to land 12/11   PLAN: PT FREQUENCY: 2x/week  PT DURATION: 8 weeks  PLANNED INTERVENTIONS: Therapeutic exercises, Therapeutic activity, Neuromuscular re-education, Balance training, Gait training, Patient/Family education, Self Care, Joint mobilization, Stair training, Aquatic Therapy, Cryotherapy, Moist heat, Taping, Manual therapy, and Re-evaluation  PLAN FOR NEXT SESSION: continue with gait and balance training in the pool. Be aware of bradycardia.  Continue with combination of land and pool exercises.  Continue with to progress land activity.  Kerin Perna, PTA 10/06/22 1:53 PM [facility] [address] [place], [place], [address] Phone: [phone]   Fax:  [phone]

## 2022-10-10 ENCOUNTER — Encounter (HOSPITAL_BASED_OUTPATIENT_CLINIC_OR_DEPARTMENT_OTHER): Payer: Self-pay | Admitting: Physical Therapy

## 2022-10-10 ENCOUNTER — Ambulatory Visit (HOSPITAL_BASED_OUTPATIENT_CLINIC_OR_DEPARTMENT_OTHER): Payer: Medicare Other | Admitting: Physical Therapy

## 2022-10-10 DIAGNOSIS — R2689 Other abnormalities of gait and mobility: Secondary | ICD-10-CM | POA: Diagnosis not present

## 2022-10-10 DIAGNOSIS — G20A1 Parkinson's disease without dyskinesia, without mention of fluctuations: Secondary | ICD-10-CM

## 2022-10-10 NOTE — Progress Notes (Unsigned)
Assessment/Plan:    1.  Parkinsons Disease  -Skin biopsy done in October, 2023 with evidence of alpha-synuclein in all biopsy sites.             - Continue carbidopa/levodopa 25/100, 1 tablet every 2-3 hours (she takes a total of 6 tablets/day).  They are having difficulty with the new generic medication that is being used.  They are going to contact [facility] and see if they can go back to the old generic, which they had more success with.  In addition, this one is not scored, which makes it difficult at times.             -Continue pramipexole ER, 0.75 mg daily.  We discussed that this could affect cognition, but decided not to change it today as she just got over a urinary tract infection a few weeks ago.  -May be a good candidate for entacapone or opicapone in the future.  -She can continue to use Inbrija as needed. 2.  Fibromyalgia             -On Cymbalta             -On gabapentin, 400 mg twice daily (was 3 times daily) 3.  History of melanoma             -See dermatology regularly, given the fact that she has had melanoma and Parkinson's increases risk for melanoma. 4.  Insomnia, with RBD             -On Rozerem.  -She is off of all benzodiazepines 5.  Memory change, per husband  -likely due to recent UTI's.  Seemed clear to me today.  Subjective:   Morgan [name] was seen today in follow up for Parkinsons disease.  My previous records were reviewed prior to todays visit as well as outside records available to me.   Pt with husband who supplements history.  Skin biopsy was done June 24, 2022.  There was evidence of alpha-synuclein in all biopsy sites and evidence of small fiber neuropathy in the proximal leg site.  She has been doing physical therapy and those notes are reviewed.  She did see Dr. Tamala Julian at the end of November.  His notes are reviewed.  No changes were made with medication.  Current prescribed movement disorder medications: carbidopa/levodopa 25/100, 1  tablet every 2 hours starting at 6-7am (getting about 6-7 in per day) Pramipexole ER, 0.75 mg daily (compulsive behaviors at higher dosages) Cymbalta Gabapentin, 400 mg 3 times per day (taking bid now) Rozerem, 8 mg q hs  Inbrija    PREVIOUS MEDICATIONS: Mirapex and azilect; levodopa;; requip; amantadine (stopped for SE - ?what); clonazepam (taken off when primary care started alprazolam); rytary (didn't think it helped); Inbrija  ALLERGIES:   Allergies  Allergen Reactions   Other Anaphylaxis    "Anti Seizure Medications"   Phenergan [Promethazine Hcl] Other (See Comments)    Makes Parkinson's worse   Acetaminophen Other (See Comments)    "I just get weird"   Itraconazole Hives   Tamsulosin Other (See Comments)    B/P DROPPED TOO MUCH!!    CURRENT MEDICATIONS:  Outpatient Encounter Medications as of 10/11/2022  Medication Sig   ALPRAZolam (XANAX) 0.25 MG tablet Take 0.25 mg by mouth daily as needed for anxiety.    aspirin EC 81 MG tablet Take 81 mg by mouth daily. Swallow whole.   atorvastatin (LIPITOR) 10 MG tablet Take 10 mg by mouth in  the morning.   BLINK TEARS 0.25 % SOLN Place 1 drop into both eyes 3 (three) times daily as needed (for dryness).   carbidopa-levodopa (SINEMET IR) 25-100 MG tablet TAKE 1 TABLET EVERY 2 HOURS UP TO 8 TABLETS PER DAY   Cholecalciferol (VITAMIN D3) 25 MCG (1000 UT) CHEW Chew 2,500 Units by mouth daily.   Coenzyme Q10-Vitamin E (QUNOL ULTRA COQ10 PO) Take 1 capsule by mouth daily.   CYMBALTA 60 MG capsule TAKE 1 CAPSULE EVERY       MORNING   gabapentin (NEURONTIN) 400 MG capsule Take 1 capsule (400 mg total) by mouth 3 (three) times daily as needed (for leg pain or soreness).   Ibuprofen 200 MG CAPS Take 2 capsules (400 mg total) by mouth every 8 (eight) hours as needed (Leg pain, headache or mild pain).   methocarbamol (ROBAXIN) 750 MG tablet Take 750 mg by mouth every 6 (six) hours as needed for muscle spasms (or leg pain).   midodrine  (PROAMATINE) 2.5 MG tablet Take 1 tablet (2.5 mg total) by mouth 3 (three) times daily as needed (for systolic blood pressure less than 100).   Multiple Vitamins-Minerals (PRESERVISION AREDS 2 PO) Take 1 capsule by mouth 2 (two) times daily.   raloxifene (EVISTA) 60 MG tablet Take 60 mg by mouth in the morning.   ramelteon (ROZEREM) 8 MG tablet TAKE 1 TABLET AT BEDTIME   TART CHERRY PO Take 3,000 mg by mouth daily.   No facility-administered encounter medications on file as of 10/11/2022.    Objective:   PHYSICAL EXAMINATION:    VITALS:   Vitals:   10/11/22 1439  BP: 132/88  Pulse: 63  SpO2: 98%  Weight: 138 lb 6.4 oz (62.8 kg)  Height: 5' 4"$  (1.626 m)    GEN:  The patient appears stated age and is in NAD. HEENT:  Normocephalic, atraumatic.  The mucous membranes are moist.  Neurological examination:  Orientation: The patient is alert and oriented x3. Cranial nerves: There is good facial symmetry with mild facial hypomimia. The speech is fluent and clear. Soft palate rises symmetrically and there is no tongue deviation. Hearing is intact to conversational tone. Sensation: Sensation is intact to light touch throughout Motor: Strength is at least antigravity x4.  Movement examination: Tone: There is mild increased tone in the bilateral UE Abnormal movements:no dyskinesia today Gait and Station: The patient pushes off to arise.  She is short stepped and shuffles a bit.  She turns en bloc.  She ambulates with her walking stick.  I have reviewed and interpreted the following labs independently    Chemistry      Component Value Date/Time   NA 141 05/18/2022 0552   K 3.4 (L) 05/18/2022 0552   CL 106 05/18/2022 0552   CO2 27 05/18/2022 0552   BUN 11 05/18/2022 0552   CREATININE 0.64 05/18/2022 0552      Component Value Date/Time   CALCIUM 8.8 (L) 05/18/2022 0552   ALKPHOS 74 05/18/2022 0552   AST 13 (L) 05/18/2022 0552   ALT 10 05/18/2022 0552   BILITOT 0.6 05/18/2022 0552        Lab Results  Component Value Date   WBC 6.9 05/18/2022   HGB 11.5 (L) 05/18/2022   HCT 36.5 05/18/2022   MCV 92.2 05/18/2022   PLT 197 05/18/2022    Lab Results  Component Value Date   TSH 1.400 10/18/2021     Total time spent on today's visit was 32 minutes,  including both face-to-face time and nonface-to-face time.  Time included that spent on review of records (prior notes available to me/labs/imaging if pertinent), discussing treatment and goals, answering patient's questions and coordinating care.  Cc:  [name], MD

## 2022-10-10 NOTE — Therapy (Signed)
OUTPATIENT PHYSICAL THERAPY LOWER EXTREMITY TREATMENT   Patient Name: Morgan [name] MRN: [id number] DOB:01-27-1951, 72 y.o., female Today's Date: 10/06/2022   PT End of Session - 10/06/22 1127     Visit Number 19    Number of Visits 30    Date for PT Re-Evaluation 11/14/22    Authorization Type progress note done on visit 18    PT Start Time 1120    PT Stop Time 1143    PT Time Calculation (min) 23 min    Activity Tolerance Patient limited by fatigue    Behavior During Therapy WFL for tasks assessed/performed                  Past Medical History:  Diagnosis Date   Anxiety    Fibromyalgia    Headache    Hypercholesteremia    controlled on medication   Hyperlipidemia    Hypertension    no current meds   LBBB (left bundle branch block)    Melanoma (HCC)    MVP (mitral valve prolapse)    Osteopenia    Parkinson disease    Pneumonia    SVD (spontaneous vaginal delivery)    x 2   Past Surgical History:  Procedure Laterality Date   BLEPHAROPLASTY     cold knife conization     COLONOSCOPY     FH colon CA   CYSTOSCOPY W/ URETERAL STENT PLACEMENT Left 10/18/2021   Procedure: CYSTOSCOPY WITH RETROGRADE PYELOGRAM, URETERAL STENT PLACEMENT;  Surgeon: Festus Aloe, MD;  Location: [facility];  Service: Urology;  Laterality: Left;   DILATATION & CURETTAGE/HYSTEROSCOPY WITH MYOSURE N/A 02/14/2018   Procedure: ATTEMPTED DILATATION & CURETTAGE/HYSTEROSCOPY WITH MYOSURE;  Surgeon: Christophe Louis, MD;  Location: [facility];  Service: Gynecology;  Laterality: N/A;  polyp   melanoma cancer     removed from left leg   RECONSTRUCTION OF EYELID     TONSILLECTOMY     TUBAL LIGATION     URETEROSCOPY WITH HOLMIUM LASER LITHOTRIPSY Left 12/17/2021   Procedure: URETEROSCOPY/HOLMIUM LASER/STENT EXCHANGE;  Surgeon: Festus Aloe, MD;  Location: [facility];  Service: Urology;  Laterality: Left;   WISDOM TOOTH EXTRACTION     Patient Active Problem List   Diagnosis Date Noted   COVID-19  virus infection 05/16/2022   HTN (hypertension) 10/20/2021   Physical deconditioning 10/20/2021   Hydronephrosis with infection 10/18/2021   Bacteremia due to Escherichia coli 10/18/2021   Severe sepsis ([place]) 10/17/2021   UTI (urinary tract infection) [id number]   Acute metabolic encephalopathy [id number]   Parkinson disease 10/17/2021   Cervical stenosis (uterine cervix) 02/14/2018   Postmenopausal bleeding 02/14/2018   LBBB (left bundle branch block) 12/25/2015   PVCs (premature ventricular contractions) 12/25/2015   Hyperlipidemia 12/25/2015   Fibromyalgia 01/16/2014   Cholelithiasis 04/25/2013   Paralysis agitans 01/15/2013   Unspecified hereditary and idiopathic peripheral neuropathy 01/15/2013   [name] MD PCP:   Loistine Chance PROVIDER: Hulan Saas MD   REFERRING DIAG: Parkinsons   THERAPY DIAG:  Other abnormalities of gait and mobility  Parkinson's disease without dyskinesia or fluctuating manifestations  Rationale for Evaluation and Treatment Rehabilitation  ONSET DATE: Several years   SUBJECTIVE:   SUBJECTIVE STATEMENT: Pt reports she does not feel as strong as she was when last at therapy before vacation.  She verbalizes desire to do land visits instead of aquatic therapy.     From EVAL: The patient has had Parkinsons for many years. She has had land therapy with varying degrees  of success. She has recently had an onset of bilateral leg pain that comes and goes. The pain is mostly in the front but can go down to the back as well. She is a member of the gym. She has frequent falls.   PERTINENT HISTORY: Fibromyalgia; headaches, LBBB; osteopenia   PAIN:  Are you having pain? yes: NPRS scale:4/10 Pain location: LEs Pain description: sore Aggravating factors: just comes and goes  Relieving factors: just comes and goes; sometimes ibuprofin works but she is limited as to how much she can take  PRECAUTIONS: Patient reports that because of her heart she will  pass out at times.   WEIGHT BEARING RESTRICTIONS No  FALLS:  Has patient fallen in last 6 months? Yes. Number of falls 3-4 most recently slipped on something in the closet and fell face first    LIVING ENVIRONMENT: Stairs to the second floor in her house but she dosen't do much  4 steps in the front  2 steps in the garage  OCCUPATION: retired   Office manager: reading; goes to an exercise class   PLOF: Independent with household mobility with device  PATIENT GOALS  To have less pain in her legs and to move better    OBJECTIVE: * findings taken at evaluation, unless otherwise noted.   DIAGNOSTIC FINDINGS:   PATIENT SURVEYS:  FOTO    COGNITION:  Overall cognitive status: Within functional limits for tasks assessed     SENSATION: Denies paresthesias   POSTURE: No Significant postural limitations  PALPATION: No trigger points noted   LOWER EXTREMITY ROM:  Passive ROM Right eval Left eval  Hip flexion    Hip extension    Hip abduction    Hip adduction    Hip internal rotation    Hip external rotation    Knee flexion    Knee extension    Ankle dorsiflexion    Ankle plantarflexion    Ankle inversion    Ankle eversion     (Blank rows = not tested)  WNL  LOWER EXTREMITY MMT:  MMT Right eval Left eval right left Right  2/5 Left  2/5  Hip flexion 21.7 29.7 29.6 35.1 29.3 32.1  Hip extension        Hip abduction 27.6 18.5 20.7 22.4 24.1 34.2  Hip adduction        Hip internal rotation        Hip external rotation        Knee flexion        Knee extension 23.5 20.2 27.4 25.5 32.5 26.7  Ankle dorsiflexion        Ankle plantarflexion        Ankle inversion        Ankle eversion         (Blank rows = not tested)  GAIT: Decreased hip flexion; leans to the left; right hip frop. Patient required CGA while walking and min a when she turned for balance.   Functional tests:  Sit to stand: uses's hands and needs several trials to stand from the table. Poor  intial standing balance 2/5 poor psoterior balance upon initial standing   12/11 5x sit to stand: 20 seconds  2/5 11 seconds with guarding for posterior balance    2/5  BALANCE: 08/08/22 Narrow base min A  Narrow base eyes closed min to mod A  Tandem stance Min A both ways.   2/5 Balance   TODAY'S TREATMENT: 2/12 Nu-step L5 5 min  Tricpes  press down 3x15 20 lbs  Life fitness row 3x15 30 lbs  Hip abduction machine 3x15   Air-ex:  Step on and off x20  Tandem stance 2x30 sec each leg  Narrow base x20 sec each leg  Forward and backwards rocking x20  Hurdle x20   mod a for assist with all balance exercises and gait belt.      2/8 Pt seen for aquatic therapy today.  Treatment took place in water 3.25-4.5 ft in depth at the [facility]. Temp of water was 91.  Pt entered/exited the pool via stairs with supervision with bilat rail.  * holding white barbell - forward/ backward gait, side stepping - cues for increased step length * holding yellow hand float:  forward/backward step; side lunge and return to neutral with single arm reach * holding wall:  squats 5, heel raises x 5 * seated on bench - STS with CGA/min A x 5, unsteady * return to walking forward/ backward without UE support  * squat to raise arms overhead x 5  Pt requires the buoyancy and hydrostatic pressure of water for support, and to offload joints by unweighting joint load by at least 50 % in navel deep water and by at least 75-80% in chest to neck deep water.  Viscosity of the water is needed for resistance of strengthening. Water current perturbations provides challenge to standing balance requiring increased core activation.  2/5 Nu-step: 5 min L4 seat 7  #1 Life Fitness 25 lbs 3x15  Knee extension life fitness 15 lbs 3x15  Hip abduction life fitness 55 3x15   Reviewed set up of gym equipment and how to get on ana doff machines   Performed re-assessment on the patient. Reviewed test and  measures and goals including strength testing, FOTO, and ROM   09/05/22:  CGA provided for safe gait between machines NuStep seat 7, UE #9: L2 (90-100SPM)- 5 min total Cybex lat pull down- 10# x 15, x 2 Cybex Row machine: (chest pad at 3) 13.5#  x 10 x 2 Cybex chest press-  0# x 10 - (should break into 2 sets of 5 next time) Cybex tricep ext - 0# x 10 x 2 LF Seated leg press 0#, x 10 x 2, cues for full range LF hip abdct 35# x  20 x2     PATIENT EDUCATION:  Education details: modifications to wts on machines; protection of joints by controlling motion / speed when using wt machines. Person educated: Patient Education method: Explanation, Demonstration, Tactile cues, Verbal cues, and Handouts Education comprehension: verbalized understanding, returned demonstration, verbal cues required, tactile cues required, and needs further education   HOME EXERCISE PROGRAM:  has a program already; aquatic HEP TBD  ASSESSMENT:  CLINICAL IMPRESSION: Patient continues to require min to mod a to safety get on and off the machines. They see the neurologist tomorrow. Her gait appears to be more shuffling and ataxic today. She required mod a for all transfers. She was able to complete balance exercises with min to mod a.    OBJECTIVE IMPAIRMENTS Abnormal gait, decreased activity tolerance, decreased balance, decreased mobility, difficulty walking, decreased strength, impaired tone, and pain.   ACTIVITY LIMITATIONS carrying, lifting, bending, standing, squatting, stairs, transfers, bed mobility, dressing, and locomotion level  PARTICIPATION LIMITATIONS: meal prep, cleaning, laundry, driving, shopping, community activity, and yard work  PERSONAL FACTORS Fibromyalgia; headaches, LBBB; osteopenia  are also affecting patient's functional outcome.   REHAB POTENTIAL: Good  CLINICAL DECISION MAKING: Evolving/moderate  complexity increasing pain in her quads   EVALUATION COMPLEXITY:  Moderate   GOALS: Goals reviewed with patient? Yes  SHORT TERM GOALS: Target date: 07/13/2022 Patient will transfer sit to stand smoothly and independently  Baseline: Goal status: Worked on technique with sit to stand transfer today improving  2.  Patient will increase gross strength by 5lbs  Baseline:  Goal status: Met for most muscle groups 12/11  3.  Patient will be independent with base pool program  Baseline:  Goal status: Progressing well towards goals 12/11  LONG TERM GOALS: Target date: 10/03/2022  Patient will go improve balance to CGA for all balance exercises to improve safety  Baseline:  Goal status: Improved to contact-guard today with static balance will progress towards more dynamic balance 12/11  2.  Patient will reach FOTO goal to demonstrate improved function  Baseline:  Goal status: Decreased Foto score but after review of answers initial photo was probably not answered correctly 12/11  3.  Patient will have a full balance program  Baseline:  Goal status: Will work more on the balance program as he progressed more to land 12/11   PLAN: PT FREQUENCY: 2x/week  PT DURATION: 8 weeks  PLANNED INTERVENTIONS: Therapeutic exercises, Therapeutic activity, Neuromuscular re-education, Balance training, Gait training, Patient/Family education, Self Care, Joint mobilization, Stair training, Aquatic Therapy, Cryotherapy, Moist heat, Taping, Manual therapy, and Re-evaluation  PLAN FOR NEXT SESSION: continue with gait and balance training in the pool. Be aware of bradycardia.  Continue with combination of land and pool exercises.  Continue with to progress land activity.  Carolyne Littles PT DPT  10/06/22 1:53 PM [facility] [address] [place], [place], [address] Phone: [phone]   Fax:  [phone]

## 2022-10-11 ENCOUNTER — Encounter: Payer: Self-pay | Admitting: Neurology

## 2022-10-11 ENCOUNTER — Ambulatory Visit (INDEPENDENT_AMBULATORY_CARE_PROVIDER_SITE_OTHER): Payer: Medicare Other | Admitting: Neurology

## 2022-10-11 VITALS — BP 132/88 | HR 63 | Ht 64.0 in | Wt 138.4 lb

## 2022-10-11 DIAGNOSIS — G20B2 Parkinson's disease with dyskinesia, with fluctuations: Secondary | ICD-10-CM | POA: Diagnosis not present

## 2022-10-11 NOTE — Patient Instructions (Signed)
Local and Online Resources for Power over Parkinson's Group  February 2024   LOCAL [place] PARKINSON'S GROUPS   Power over Parkinson's Group:    Power Over Parkinson's Patient Education Group will be Wednesday, February 14th-*Hybrid meting*- in person at [facility] location and via [facility], 2:00-3:00 pm.   Starting in November, Power over [employer] and Care Partner Groups will meet together, with plans for separate break out session for caregivers (*this will be evolving over the next few months) Upcoming Power over Parkinson's Meetings/Care Partner Support:  2nd Wednesdays of the month at 2 pm:  February 14th, March 13th  [place] at [email or web address] if interested in participating in this group    [place]! Moves Classes in [place]!  Starting September 22, 2022.  Thursdays, January 25-November 03, 2022 at 11:45 am.  [facility].  Free to participate (through grant with [employer]) and registration required.  Please contact Corwin Levins at [email or web address] to register. Let's Try Pickleball-$25 for 6 weeks of Pickleball, starting February 2nd.  Contact Corwin Levins for more details.  [email or web address] Parkinson's Community Game Night at [facility], Date TBA in Late February, email Sarah for details [name] THRIVE, an exercise group for early-onset Parkinson's Disease will resume meeting in February, email Sarah for details [email or web address] Parkinson's CarePartner Group for Men is in the works, if interested email [email or web address] ACT FITNESS Chair Yoga classes "Train and Gain", Fridays 10 am, ACT [employer].  Contact Gina at [phone].  PWR! Moves Dynegy Instructor-Led Classes offering at [employer]!  TUESDAYS and Wednesdays 1-2 pm.   Contact Vonna Kotyk at  [email or web address]  or  [phone] (Tuesday classes are modified for chair and standing only) Drumming for Parkinson's will be held on 2nd and 4th Mondays at 11:00 am.   Located at the [place] ([place]  Contact Doylene Canning at [email or web address] or [phone]  Dance for Parkinson 's classes will be on Tuesdays 9:30am-10:30am starting back in February. Located in the [employer] , in the first floor of the [employer] ([place] To register:  [email or web address] or [place] Class, Mondays at 11 am.  Call [phone] for details SAVE THE DATE and REGISTER:  [place]:  [employer].  Conversations about Parkinson's.  Saturday, October 29, 2022, 9:00 am-2:00 pm.  [place], *In person or online via [place]*.  Register at [email or web address] or call Beverlee Nims at [phone].  Sellers:  [email or web address]  PD Health at Home continues:  Mindfulness Mondays, Wellness Wednesdays, Fitness Fridays   Upcoming Education:   Parkinson's 101.  Wednesday, February 7th, 1-2 pm The Changing Landscape of Intimacy.  Wednesday, February 14th, 1-2 pm New to Parkinson's:  Care Partner Basics.  Wednesday, February 28th, 1-2 pm Expert Briefing:  Understanding Pain in Parkinson's.   Wednesday, April 10th,  1-2 pm  Register for virtual education and Patent attorney (webinars) at [email or web address] Please check out their website to sign up for emails and see their full online offerings      [place]:  [email or web address]   Third Thursday Webinars:  On the third Thursday of every month at 12 p.m. ET, join our free live webinars to learn about various aspects of living with Parkinson's disease and our work to speed medical breakthroughs.  Upcoming  Webinar:  Therapies  for Tomorrow:  How Better Clinical Trial Design Leads to Better Treatments.  Thursday, February 15th at 12 noon. Check out additional information on their website to see their full online offerings    [facility]:  [email or web address]  Upcoming Webinar:   [place].  Thursday, February 8th, 12 noon Webinar Series:  Living with Parkinson's Meetup.   Third Thursdays each month, 3 pm  Care Partner Monthly Meetup.  With Robin Searing [place]ney.  First Tuesday of each month, 2 pm  Check out additional information to Live Well Today on their website    Parkinson and Movement Disorders (PMD) Alliance:  www.[email or web address]  [employer]Life Online:  Online Education Events  Sign up for emails, which are sent weekly to give you updates on programming and online offerings    Parkinson's Association of the [place]:  www.[email or web address]  Information on online support groups, education events, and online exercises including Yoga, Parkinson's exercises and more-LOTS of information on links to PD resources and online events  Virtual Support Group through Parkinson's Association of the [place]; next one is scheduled for Wednesday, Feb 7th  MOVEMENT AND EXERCISE OPPORTUNITIES  PWR! Moves Classes at [place].  Wednesdays 10 and 11 am.   Contact Amy Marriott, PT [email or web address] if interested.  PWR! Moves Class offerings at [employer]. *TUESDAYS* and Wednesdays 1-2 pm.    Contact Vonna Kotyk at  [email or web address]    Parkinson's Wellness Recovery (PWR! Moves)  www.[email or web address]  Info on the PWR! Virtual Experience:  You will have access to our expertise?through self-assessment, guided plans that start with the PD-specific fundamentals, educational content, tips, Q&A with an expert, and a growing Art therapist of PD-specific pre-recorded and live exercise classes of varying types and  intensity - both physical and cognitive! If that is not enough, we offer 1:1 wellness consultations (in-person or virtual) to personalize your PWR! Research scientist (medical).   [place] Fridays:   As part of the PD Health @ Home program, this free video series focuses each week on one aspect of fitness designed to support people living with Parkinson's.? These weekly videos highlight the [place] fitness guidelines for people with Parkinson's disease.  ModemGamers.si  Dance for PD website is offering free, live-stream classes throughout the week, as well as links to [employer] of classes:  [email or web address]  Virtual dance and Pilates for Parkinson's classes: Click on the Community Tab> Parkinson's Movement Initiative Tab.  To register for classes and for more information, visit [email or web address] and click the "community" tab.   [employer] Parkinson's Cycling Classes   [employer]:  Thursdays @ Noon-Live classes at [employer] ([employer] at [email or web address]?or [phone])  [employer]: Virtual Classes Mondays and Thursdays Jeanette Caprice classes Tuesday, Wednesday and Thursday (contact [place] at [email or web address] ?or [phone])  [place]  Varied levels of classes are offered Tuesdays and Thursdays at [employer].   Stretching with Verdis Frederickson weekly class is also offered for people with Parkinson's  To observe a class or for more information, call [phone] or email Hezzie Bump at [email or web address]   ADDITIONAL SUPPORT AND RESOURCES  Well[employer]:Online Caregiver Education Opportunities:  www.well-springsolutions.org/caregiver-education/caregiver-support-group.  You may also contact Vickki Muff at [email or web address] or [phone].     Family Caregiver [phone].  Thursday, March 7th, 10:15-1:45 at  [facility].  Register with [facility] (see above) Well-Spring Navigator:  Just1Navigator program, a?free service to help individuals and families through the journey of determining care for older adults.  The "Navigator" is a Education officer, museum, Arnell Asal, who will speak with a prospective client and/or loved ones to provide an assessment of the situation and a set of recommendations for a personalized care plan -- all free of charge, and whether?Well[employer]Spring [employer] offers the needed service or not. If the need is not a service we provide, we are well-connected with reputable programs in town that we can refer you to.  www.well-springsolutions.org or to speak with the Navigator, call [phone].

## 2022-10-12 NOTE — Progress Notes (Unsigned)
Morgan [name] Phone: [phone] Subjective:   Morgan [name], am serving as a scribe for Dr. Hulan Saas.  I'm seeing this patient by the request  of:  [name], MD  CC: pain   [id number]  07/19/2022 Continues to have significant difficulty.  Does have significant dynamics with patient's primary caregiver who is continuing to have difficulties with helping motivate this individual.  [name] was doing well with the pool therapy but patient is not a large fan of it.  We discussed other things such as balance towards a potential posterior stroke to help with strengthening of the musculature in avoiding risk of catastrophic fractures.  We discussed with patient about some of the different medications.  Has been seen her neurologist regularly and states that sometimes it seems that the balance acutely gets worse when taking the Sinemet initially but has to continue to take it though because she continues to have rigidity.  We discussed with patient to continue to monitor.  Do not want to make significant changes in the medications at the moment.  We did discuss different laboratory workup that could be helpful.  Patient would like to hold till she sees her primary care provider before she has these drawn.  Follow-up with me again in 2 months.  Continue the physical therapy for now.  Total time with patient as well as primary caregiver 31 minutes   Update 2/202/2024 Morgan [name] is a 72 y.o. female coming in with complaint of parkinson's. Patient states that she is not progressing with physical therapy. Continues to have lower back and leg pain and weakness.     Reviewed PT notes  Reviewed neurology notes as well   Past Medical History:  Diagnosis Date   Anxiety    Fibromyalgia    Headache    Hypercholesteremia    controlled on medication   Hyperlipidemia    Hypertension    [name] current meds   LBBB (left bundle  branch block)    Melanoma (HCC)    MVP (mitral valve prolapse)    Osteopenia    Parkinson disease    Pneumonia    SVD (spontaneous vaginal delivery)    x 2   Past Surgical History:  Procedure Laterality Date   BLEPHAROPLASTY     cold knife conization     COLONOSCOPY     FH colon CA   CYSTOSCOPY W/ URETERAL STENT PLACEMENT Left 10/18/2021   Procedure: CYSTOSCOPY WITH RETROGRADE PYELOGRAM, URETERAL STENT PLACEMENT;  Surgeon: Festus Aloe, MD;  Location: [facility];  Service: Urology;  Laterality: Left;   DILATATION & CURETTAGE/HYSTEROSCOPY WITH MYOSURE N/A 02/14/2018   Procedure: ATTEMPTED DILATATION & CURETTAGE/HYSTEROSCOPY WITH MYOSURE;  Surgeon: Christophe Louis, MD;  Location: [facility];  Service: Gynecology;  Laterality: N/A;  polyp   melanoma cancer     removed from left leg   RECONSTRUCTION OF EYELID     TONSILLECTOMY     TUBAL LIGATION     URETEROSCOPY WITH HOLMIUM LASER LITHOTRIPSY Left 12/17/2021   Procedure: URETEROSCOPY/HOLMIUM LASER/STENT EXCHANGE;  Surgeon: Festus Aloe, MD;  Location: [facility];  Service: Urology;  Laterality: Left;   WISDOM TOOTH EXTRACTION     Social History   Socioeconomic History   Marital status: Married    Spouse name: Not on file   Number of children: 2   Years of education: Not on file   Highest education level: Bachelor's degree (e.g., BA, AB, BS)  Occupational History   Occupation: retired    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  Tobacco Use   Smoking status: Former    Packs/day: 0.25    Types: Cigarettes    Quit date: 09/29/1997    Years since quitting: 25.0   Smokeless tobacco: Never   Tobacco comments:    chews [place], 10-12/day  Vaping Use   Vaping Use: Never used  Substance and Sexual Activity   Alcohol use: Yes    Comment: Occasional wine   Drug use: [name]   Sexual activity: Not on file  Other Topics Concern   Not on file  Social History Narrative   right handed   2 story home    Lives with spouse   Social Determinants  of Health   Financial Resource Strain: Not on file  Food Insecurity: [name] Food Insecurity (05/17/2022)   Hunger Vital Sign    Worried About Running Out of Food in the Last Year: Never true    Ran Out of Food in the Last Year: Never true  Transportation Needs: [name] Transportation Needs (05/31/2022)   PRAPARE - Hydrologist (Medical): [name]    Lack of Transportation (Non-Medical): [name]  Physical Activity: Not on file  Stress: Not on file  Social Connections: Not on file   Allergies  Allergen Reactions   Other Anaphylaxis    "Anti Seizure Medications"   Phenergan [Promethazine Hcl] Other (See Comments)    Makes Parkinson's worse   Acetaminophen Other (See Comments)    "I just get weird"   Itraconazole Hives   Tamsulosin Other (See Comments)    B/P DROPPED TOO MUCH!!   Family History  Problem Relation Age of Onset   Cancer Mother        colon   Healthy Brother    Healthy Sister    Healthy Child     Current Outpatient Medications (Endocrine & Metabolic):    raloxifene (EVISTA) 60 MG tablet, Take 60 mg by mouth in the morning.  Current Outpatient Medications (Cardiovascular):    atorvastatin (LIPITOR) 10 MG tablet, Take 10 mg by mouth in the morning.   midodrine (PROAMATINE) 2.5 MG tablet, Take 1 tablet (2.5 mg total) by mouth 3 (three) times daily as needed (for systolic blood pressure less than 100).   Current Outpatient Medications (Analgesics):    aspirin EC 81 MG tablet, Take 81 mg by mouth daily. Swallow whole.   Ibuprofen 200 MG CAPS, Take 2 capsules (400 mg total) by mouth every 8 (eight) hours as needed (Leg pain, headache or mild pain).   Current Outpatient Medications (Other):    ALPRAZolam (XANAX) 0.25 MG tablet, Take 0.25 mg by mouth daily as needed for anxiety.    BLINK TEARS 0.25 % SOLN, Place 1 drop into both eyes 3 (three) times daily as needed (for dryness).   carbidopa-levodopa (SINEMET IR) 25-100 MG tablet, 1 tablet every 2 hours  starting at 6-7am (getting about 6-7 in per day)The medication have a score line so it can be broken.   Cholecalciferol (VITAMIN D3) 25 MCG (1000 UT) CHEW, Chew 2,500 Units by mouth daily.   Coenzyme Q10-Vitamin E (QUNOL ULTRA COQ10 PO), Take 1 capsule by mouth daily.   CYMBALTA 60 MG capsule, TAKE 1 CAPSULE EVERY       MORNING   gabapentin (NEURONTIN) 400 MG capsule, Take 1 capsule (400 mg total) by mouth 3 (three) times daily as needed (for leg pain or soreness).   Levodopa (INBRIJA)  42 MG CAPS, You can inhale the capsules as needed up to 5 times per day, separated by 2 hour intervals.   methocarbamol (ROBAXIN) 750 MG tablet, Take 750 mg by mouth every 6 (six) hours as needed for muscle spasms (or leg pain).   Multiple Vitamins-Minerals (PRESERVISION AREDS 2 PO), Take 1 capsule by mouth 2 (two) times daily.   ramelteon (ROZEREM) 8 MG tablet, TAKE 1 TABLET AT BEDTIME   TART CHERRY PO, Take 3,000 mg by mouth daily.   Reviewed prior external information including notes and imaging from  primary care provider As well as notes that were available from care everywhere and other healthcare systems.  Past medical history, social, surgical and family history all reviewed in electronic medical record.  [name] pertanent information unless stated regarding to the chief complaint.   Review of Systems:  [name] headache, visual changes, nausea, vomiting, diarrhea, constipation, dizziness, abdominal pain, skin rash, fevers, chills, night sweats, weight loss, swollen lymph nodes,  joint swelling, chest pain, shortness of breath, mood changes. POSITIVE muscle aches, body aches  Objective  Blood pressure 102/72, pulse 91, height 5' 4"$  (1.626 m), weight 138 lb (62.6 kg), SpO2 94 %.   General: [name] apparent distress alert and oriented x3 mood and affect normal, dressed appropriately.  Masked facies noted, patient does have some dyskinesis noted. HEENT: Pupils equal, extraocular movements intact  Respiratory: Patient's  speak in full sentences and does not appear short of breath  Cardiovascular: [name] lower extremity edema, non tender, [name] erythema  Still has some physical deconditioning noted.  Patient is tender to palpation over the greater trochanteric areas.  Deferred the rest of the exam.    Impression and Recommendations:    The above documentation has been reviewed and is accurate and complete Lyndal Pulley, DO

## 2022-10-13 ENCOUNTER — Ambulatory Visit (HOSPITAL_BASED_OUTPATIENT_CLINIC_OR_DEPARTMENT_OTHER): Payer: Medicare Other | Admitting: Physical Therapy

## 2022-10-13 ENCOUNTER — Encounter (HOSPITAL_BASED_OUTPATIENT_CLINIC_OR_DEPARTMENT_OTHER): Payer: Self-pay | Admitting: Physical Therapy

## 2022-10-13 DIAGNOSIS — R2689 Other abnormalities of gait and mobility: Secondary | ICD-10-CM | POA: Diagnosis not present

## 2022-10-13 DIAGNOSIS — G20A1 Parkinson's disease without dyskinesia, without mention of fluctuations: Secondary | ICD-10-CM | POA: Diagnosis not present

## 2022-10-13 NOTE — Therapy (Signed)
OUTPATIENT PHYSICAL THERAPY LOWER EXTREMITY TREATMENT   Patient Name: Morgan [name] MRN: [id number] DOB:July 20, 1951, 72 y.o., female Today's Date: 10/13/2022   PT End of Session - 10/13/22 1135     Visit Number 21    Number of Visits 30    Date for PT Re-Evaluation 11/14/22    Authorization Type progress note done on visit 82    PT Start Time 1124    PT Stop Time 1157    PT Time Calculation (min) 33 min    Activity Tolerance Patient limited by fatigue    Behavior During Therapy WFL for tasks assessed/performed                  Past Medical History:  Diagnosis Date   Anxiety    Fibromyalgia    Headache    Hypercholesteremia    controlled on medication   Hyperlipidemia    Hypertension    no current meds   LBBB (left bundle branch block)    Melanoma (HCC)    MVP (mitral valve prolapse)    Osteopenia    Parkinson disease    Pneumonia    SVD (spontaneous vaginal delivery)    x 2   Past Surgical History:  Procedure Laterality Date   BLEPHAROPLASTY     cold knife conization     COLONOSCOPY     FH colon CA   CYSTOSCOPY W/ URETERAL STENT PLACEMENT Left 10/18/2021   Procedure: CYSTOSCOPY WITH RETROGRADE PYELOGRAM, URETERAL STENT PLACEMENT;  Surgeon: Festus Aloe, MD;  Location: [facility];  Service: Urology;  Laterality: Left;   DILATATION & CURETTAGE/HYSTEROSCOPY WITH MYOSURE N/A 02/14/2018   Procedure: ATTEMPTED DILATATION & CURETTAGE/HYSTEROSCOPY WITH MYOSURE;  Surgeon: Christophe Louis, MD;  Location: [facility];  Service: Gynecology;  Laterality: N/A;  polyp   melanoma cancer     removed from left leg   RECONSTRUCTION OF EYELID     TONSILLECTOMY     TUBAL LIGATION     URETEROSCOPY WITH HOLMIUM LASER LITHOTRIPSY Left 12/17/2021   Procedure: URETEROSCOPY/HOLMIUM LASER/STENT EXCHANGE;  Surgeon: Festus Aloe, MD;  Location: [facility];  Service: Urology;  Laterality: Left;   WISDOM TOOTH EXTRACTION     Patient Active Problem List   Diagnosis Date Noted    COVID-19 virus infection 05/16/2022   HTN (hypertension) 10/20/2021   Physical deconditioning 10/20/2021   Hydronephrosis with infection 10/18/2021   Bacteremia due to Escherichia coli 10/18/2021   Severe sepsis ([place]) 10/17/2021   UTI (urinary tract infection) A999333   Acute metabolic encephalopathy [id number]   Parkinson disease 10/17/2021   Cervical stenosis (uterine cervix) 02/14/2018   Postmenopausal bleeding 02/14/2018   LBBB (left bundle branch block) 12/25/2015   PVCs (premature ventricular contractions) 12/25/2015   Hyperlipidemia 12/25/2015   Fibromyalgia 01/16/2014   Cholelithiasis 04/25/2013   Paralysis agitans 01/15/2013   Unspecified hereditary and idiopathic peripheral neuropathy 01/15/2013   [name] MD PCP:   Loistine Chance PROVIDER: Hulan Saas MD   REFERRING DIAG: Parkinsons   THERAPY DIAG:  Other abnormalities of gait and mobility  Parkinson's disease without dyskinesia or fluctuating manifestations  Rationale for Evaluation and Treatment Rehabilitation  ONSET DATE: Several years   SUBJECTIVE:   SUBJECTIVE STATEMENT: Pt reports she was worn out after last session.  Her husband reports that she needed WC for tranport to the car.   Pt's husband states that they still need to "dial in" her medication.  He reports she has a follow up with Dr. Charlann Boxer next week.  From EVAL: The patient has had Parkinsons for many years. She has had land therapy with varying degrees of success. She has recently had an onset of bilateral leg pain that comes and goes. The pain is mostly in the front but can go down to the back as well. She is a member of the gym. She has frequent falls.   PERTINENT HISTORY: Fibromyalgia; headaches, LBBB; osteopenia   PAIN:  Are you having pain? yes: NPRS scale:3/10 Pain location: LEs Pain description: sore Aggravating factors: just comes and goes  Relieving factors: just comes and goes; sometimes ibuprofin works but she is  limited as to how much she can take  PRECAUTIONS: Patient reports that because of her heart she will pass out at times.   WEIGHT BEARING RESTRICTIONS No  FALLS:  Has patient fallen in last 6 months? Yes. Number of falls 3-4 most recently slipped on something in the closet and fell face first    LIVING ENVIRONMENT: Stairs to the second floor in her house but she dosen't do much  4 steps in the front  2 steps in the garage  OCCUPATION: retired   Office manager: reading; goes to an exercise class   PLOF: Independent with household mobility with device  PATIENT GOALS  To have less pain in her legs and to move better    OBJECTIVE: * findings taken at evaluation, unless otherwise noted.   DIAGNOSTIC FINDINGS:   PATIENT SURVEYS:  FOTO    COGNITION:  Overall cognitive status: Within functional limits for tasks assessed     SENSATION: Denies paresthesias   POSTURE: No Significant postural limitations  PALPATION: No trigger points noted   LOWER EXTREMITY ROM:  Passive ROM Right eval Left eval  Hip flexion    Hip extension    Hip abduction    Hip adduction    Hip internal rotation    Hip external rotation    Knee flexion    Knee extension    Ankle dorsiflexion    Ankle plantarflexion    Ankle inversion    Ankle eversion     (Blank rows = not tested)  WNL  LOWER EXTREMITY MMT:  MMT Right eval Left eval right left Right  2/5 Left  2/5  Hip flexion 21.7 29.7 29.6 35.1 29.3 32.1  Hip extension        Hip abduction 27.6 18.5 20.7 22.4 24.1 34.2  Hip adduction        Hip internal rotation        Hip external rotation        Knee flexion        Knee extension 23.5 20.2 27.4 25.5 32.5 26.7  Ankle dorsiflexion        Ankle plantarflexion        Ankle inversion        Ankle eversion         (Blank rows = not tested)  GAIT: Decreased hip flexion; leans to the left; right hip frop. Patient required CGA while walking and min a when she turned for balance.    Functional tests:  Sit to stand: uses's hands and needs several trials to stand from the table. Poor intial standing balance 2/5 poor psoterior balance upon initial standing   12/11 5x sit to stand: 20 seconds  2/5 11 seconds with guarding for posterior balance    2/5  BALANCE: 08/08/22 Narrow base min A  Narrow base eyes closed min to mod A  Tandem stance Min  A both ways.   2/5 Balance   TODAY'S TREATMENT: 2/15 NuStep L3 x 5:45, UE and LE  Air-ex by counter with use of gait belt (CGA/SBA):   -Forward step on/off with cues to slow down and work on steadying once on cushion; intermittent UE on counter   -lateral step ups  -NBOS with horiz and vertical head turns  Hip bumps and return to upright without use of hands    High kneeling onto air-ex on 4" box - to single foot up to/from standing  LF Leg press, seat at 10, (0#) 2 x 10 (cues to slow speed and get to full knee ext each rep)  2/12 Nu-step L5 5 min  Tricpes press down 3x15 20 lbs  Life fitness row 3x15 30 lbs  Hip abduction machine 3x15   Air-ex:  Step on and off x20  Tandem stance 2x30 sec each leg  Narrow base x20 sec each leg  Forward and backwards rocking x20  Hurdle x20   mod a for assist with all balance exercises and gait belt.  2/8 Pt seen for aquatic therapy today.  Treatment took place in water 3.25-4.5 ft in depth at the [facility]. Temp of water was 91.  Pt entered/exited the pool via stairs with supervision with bilat rail.  * holding white barbell - forward/ backward gait, side stepping - cues for increased step length * holding yellow hand float:  forward/backward step; side lunge and return to neutral with single arm reach * holding wall:  squats 5, heel raises x 5 * seated on bench - STS with CGA/min A x 5, unsteady * return to walking forward/ backward without UE support  * squat to raise arms overhead x 5  Pt requires the buoyancy and hydrostatic pressure of water  for support, and to offload joints by unweighting joint load by at least 50 % in navel deep water and by at least 75-80% in chest to neck deep water.  Viscosity of the water is needed for resistance of strengthening. Water current perturbations provides challenge to standing balance requiring increased core activation.  2/5 Nu-step: 5 min L4 seat 7  #1 Life Fitness 25 lbs 3x15  Knee extension life fitness 15 lbs 3x15  Hip abduction life fitness 55 3x15   Reviewed set up of gym equipment and how to get on ana doff machines   Performed re-assessment on the patient. Reviewed test and measures and goals including strength testing, FOTO, and ROM   09/05/22:  CGA provided for safe gait between machines NuStep seat 7, UE #9: L2 (90-100SPM)- 5 min total Cybex lat pull down- 10# x 15, x 2 Cybex Row machine: (chest pad at 3) 13.5#  x 10 x 2 Cybex chest press-  0# x 10 - (should break into 2 sets of 5 next time) Cybex tricep ext - 0# x 10 x 2 LF Seated leg press 0#, x 10 x 2, cues for full range LF hip abdct 35# x  20 x2     PATIENT EDUCATION:  Education details: modifications to wts on machines; protection of joints by controlling motion / speed when using wt machines. Person educated: Patient Education method: Explanation, Demonstration, Tactile cues, Verbal cues Education comprehension: verbalized understanding, returned demonstration, verbal cues required, tactile cues required, and needs further education   HOME EXERCISE PROGRAM:  has a program already; aquatic HEP TBD  ASSESSMENT:  CLINICAL IMPRESSION: Patient continues to require CGA with gait around clinic/ gym for  safety and min A to safely get on and off the machines. She requires frequent cues to stop and slow speed of LE for better execution of exercises.  Flow sheet of machines used, including, wt amounts, filled out per pt request; will continue to fill out for pt for transition to exercise with husband at d/c.    OBJECTIVE  IMPAIRMENTS Abnormal gait, decreased activity tolerance, decreased balance, decreased mobility, difficulty walking, decreased strength, impaired tone, and pain.   ACTIVITY LIMITATIONS carrying, lifting, bending, standing, squatting, stairs, transfers, bed mobility, dressing, and locomotion level  PARTICIPATION LIMITATIONS: meal prep, cleaning, laundry, driving, shopping, community activity, and yard work  PERSONAL FACTORS Fibromyalgia; headaches, LBBB; osteopenia  are also affecting patient's functional outcome.   REHAB POTENTIAL: Good  CLINICAL DECISION MAKING: Evolving/moderate complexity increasing pain in her quads   EVALUATION COMPLEXITY: Moderate   GOALS: Goals reviewed with patient? Yes  SHORT TERM GOALS: Target date: 07/13/2022 Patient will transfer sit to stand smoothly and independently  Baseline: Goal status: Worked on technique with sit to stand transfer today improving  2.  Patient will increase gross strength by 5lbs  Baseline:  Goal status: Met for most muscle groups 12/11  3.  Patient will be independent with base pool program  Baseline:  Goal status: Progressing well towards goals 12/11  LONG TERM GOALS: Target date: 11/14/2022  Patient will go improve balance to CGA for all balance exercises to improve safety  Baseline:  Goal status: Improved to contact-guard today with static balance will progress towards more dynamic balance 12/11  2.  Patient will reach FOTO goal to demonstrate improved function  Baseline:  Goal status: Decreased Foto score but after review of answers initial photo was probably not answered correctly 12/11  3.  Patient will have a full balance program  Baseline:  Goal status: Will work more on the balance program as she progressed more to land 12/11   PLAN: PT FREQUENCY: 2x/week  PT DURATION: 8 weeks  PLANNED INTERVENTIONS: Therapeutic exercises, Therapeutic activity, Neuromuscular re-education, Balance training, Gait training,  Patient/Family education, Self Care, Joint mobilization, Stair training, Aquatic Therapy, Cryotherapy, Moist heat, Taping, Manual therapy, and Re-evaluation  PLAN FOR NEXT SESSION: continue with gait and balance training. Be aware of bradycardia.    Kerin Perna, PTA 10/13/22 1:42 PM [facility] [address] [place], [place], [address] Phone: [phone]   Fax:  [phone]

## 2022-10-16 ENCOUNTER — Other Ambulatory Visit: Payer: Self-pay | Admitting: Neurology

## 2022-10-16 ENCOUNTER — Encounter: Payer: Self-pay | Admitting: Neurology

## 2022-10-16 DIAGNOSIS — G20A1 Parkinson's disease without dyskinesia, without mention of fluctuations: Secondary | ICD-10-CM

## 2022-10-16 DIAGNOSIS — G20B1 Parkinson's disease with dyskinesia, without mention of fluctuations: Secondary | ICD-10-CM

## 2022-10-17 ENCOUNTER — Encounter (HOSPITAL_BASED_OUTPATIENT_CLINIC_OR_DEPARTMENT_OTHER): Payer: Self-pay | Admitting: Physical Therapy

## 2022-10-17 ENCOUNTER — Ambulatory Visit (HOSPITAL_BASED_OUTPATIENT_CLINIC_OR_DEPARTMENT_OTHER): Payer: Medicare Other | Admitting: Physical Therapy

## 2022-10-17 ENCOUNTER — Other Ambulatory Visit: Payer: Self-pay

## 2022-10-17 ENCOUNTER — Telehealth: Payer: Self-pay

## 2022-10-17 DIAGNOSIS — R2689 Other abnormalities of gait and mobility: Secondary | ICD-10-CM | POA: Diagnosis not present

## 2022-10-17 DIAGNOSIS — G20A1 Parkinson's disease without dyskinesia, without mention of fluctuations: Secondary | ICD-10-CM

## 2022-10-17 MED ORDER — INBRIJA 42 MG IN CAPS: ORAL_CAPSULE | RESPIRATORY_TRACT | 1 refills | Status: DC

## 2022-10-17 NOTE — Telephone Encounter (Signed)
Patient's spouse is calling in wanting to check in on the inhaler script, ABREVA.

## 2022-10-17 NOTE — Therapy (Signed)
OUTPATIENT PHYSICAL THERAPY LOWER EXTREMITY TREATMENT   Patient Name: Morgan [name] MRN: [id number] DOB:1951/02/28, 72 y.o., female Today's Date: 10/18/2022   PT End of Session - 10/17/22 1349     Visit Number 22    Number of Visits 30    Date for PT Re-Evaluation 11/14/22    Authorization Type progress note done on visit 18    PT Start Time 1100    PT Stop Time 1143    PT Time Calculation (min) 43 min    Activity Tolerance Patient limited by fatigue    Behavior During Therapy WFL for tasks assessed/performed                  Past Medical History:  Diagnosis Date   Anxiety    Fibromyalgia    Headache    Hypercholesteremia    controlled on medication   Hyperlipidemia    Hypertension    no current meds   LBBB (left bundle branch block)    Melanoma (HCC)    MVP (mitral valve prolapse)    Osteopenia    Parkinson disease    Pneumonia    SVD (spontaneous vaginal delivery)    x 2   Past Surgical History:  Procedure Laterality Date   BLEPHAROPLASTY     cold knife conization     COLONOSCOPY     FH colon CA   CYSTOSCOPY W/ URETERAL STENT PLACEMENT Left 10/18/2021   Procedure: CYSTOSCOPY WITH RETROGRADE PYELOGRAM, URETERAL STENT PLACEMENT;  Surgeon: Jerilee Field, MD;  Location: [facility];  Service: Urology;  Laterality: Left;   DILATATION & CURETTAGE/HYSTEROSCOPY WITH MYOSURE N/A 02/14/2018   Procedure: ATTEMPTED DILATATION & CURETTAGE/HYSTEROSCOPY WITH MYOSURE;  Surgeon: Gerald Leitz, MD;  Location: [facility];  Service: Gynecology;  Laterality: N/A;  polyp   melanoma cancer     removed from left leg   RECONSTRUCTION OF EYELID     TONSILLECTOMY     TUBAL LIGATION     URETEROSCOPY WITH HOLMIUM LASER LITHOTRIPSY Left 12/17/2021   Procedure: URETEROSCOPY/HOLMIUM LASER/STENT EXCHANGE;  Surgeon: Jerilee Field, MD;  Location: [facility];  Service: Urology;  Laterality: Left;   WISDOM TOOTH EXTRACTION     Patient Active Problem List   Diagnosis Date Noted    COVID-19 virus infection 05/16/2022   HTN (hypertension) 10/20/2021   Physical deconditioning 10/20/2021   Hydronephrosis with infection 10/18/2021   Bacteremia due to Escherichia coli 10/18/2021   Severe sepsis (HCC) 10/17/2021   UTI (urinary tract infection) 10/17/2021   Acute metabolic encephalopathy 10/17/2021   Parkinson disease 10/17/2021   Cervical stenosis (uterine cervix) 02/14/2018   Postmenopausal bleeding 02/14/2018   LBBB (left bundle branch block) 12/25/2015   PVCs (premature ventricular contractions) 12/25/2015   Hyperlipidemia 12/25/2015   Fibromyalgia 01/16/2014   Cholelithiasis 04/25/2013   Paralysis agitans 01/15/2013   Unspecified hereditary and idiopathic peripheral neuropathy 01/15/2013   [name] MD PCP:   REFERRING PROVIDER: Antoine Primas MD   REFERRING DIAG: Parkinsons   THERAPY DIAG:  Other abnormalities of gait and mobility  Parkinson's disease without dyskinesia or fluctuating manifestations  Rationale for Evaluation and Treatment Rehabilitation  ONSET DATE: Several years   SUBJECTIVE:   SUBJECTIVE STATEMENT: The patient did well after her balance exercises. They have tried some different things with her medication which has helped her balance. Her legs were really sore after the last visit at the gym.  From EVAL: The patient has had Parkinsons for many years. She has had land therapy with  varying degrees of success. She has recently had an onset of bilateral leg pain that comes and goes. The pain is mostly in the front but can go down to the back as well. She is a member of the gym. She has frequent falls.   PERTINENT HISTORY: Fibromyalgia; headaches, LBBB; osteopenia   PAIN:  Are you having pain? yes: NPRS scale:3/10 Pain location: LEs Pain description: sore Aggravating factors: just comes and goes  Relieving factors: just comes and goes; sometimes ibuprofin works but she is limited as to how much she can take  PRECAUTIONS:  Patient reports that because of her heart she will pass out at times.   WEIGHT BEARING RESTRICTIONS No  FALLS:  Has patient fallen in last 6 months? Yes. Number of falls 3-4 most recently slipped on something in the closet and fell face first    LIVING ENVIRONMENT: Stairs to the second floor in her house but she dosen't do much  4 steps in the front  2 steps in the garage  OCCUPATION: retired   Presenter, broadcasting: reading; goes to an exercise class   PLOF: Independent with household mobility with device  PATIENT GOALS  To have less pain in her legs and to move better    OBJECTIVE: * findings taken at evaluation, unless otherwise noted.   DIAGNOSTIC FINDINGS:   PATIENT SURVEYS:  FOTO    COGNITION:  Overall cognitive status: Within functional limits for tasks assessed     SENSATION: Denies paresthesias   POSTURE: No Significant postural limitations  PALPATION: No trigger points noted   LOWER EXTREMITY ROM:  Passive ROM Right eval Left eval  Hip flexion    Hip extension    Hip abduction    Hip adduction    Hip internal rotation    Hip external rotation    Knee flexion    Knee extension    Ankle dorsiflexion    Ankle plantarflexion    Ankle inversion    Ankle eversion     (Blank rows = not tested)  WNL  LOWER EXTREMITY MMT:  MMT Right eval Left eval right left Right  2/5 Left  2/5  Hip flexion 21.7 29.7 29.6 35.1 29.3 32.1  Hip extension        Hip abduction 27.6 18.5 20.7 22.4 24.1 34.2  Hip adduction        Hip internal rotation        Hip external rotation        Knee flexion        Knee extension 23.5 20.2 27.4 25.5 32.5 26.7  Ankle dorsiflexion        Ankle plantarflexion        Ankle inversion        Ankle eversion         (Blank rows = not tested)  GAIT: Decreased hip flexion; leans to the left; right hip frop. Patient required CGA while walking and min a when she turned for balance.   Functional tests:  Sit to stand: uses's hands and  needs several trials to stand from the table. Poor intial standing balance 2/5 poor psoterior balance upon initial standing   12/11 5x sit to stand: 20 seconds  2/5 11 seconds with guarding for posterior balance    2/5  BALANCE: 08/08/22 Narrow base min A  Narrow base eyes closed min to mod A  Tandem stance Min A both ways.   2/5 Balance   TODAY'S TREATMENT: 2/19 NuStep L3 x 6:00,  UE and LE Leg press 15 lbs 3x15  Tricpes press down 15 lbs 3x15  Row machine 3x15 15 lbs   Reviewed setup and safety with machines      2/15 NuStep L3 x 5:45, UE and LE  Air-ex by counter with use of gait belt (CGA/SBA):   -Forward step on/off with cues to slow down and work on steadying once on cushion; intermittent UE on counter   -lateral step ups  -NBOS with horiz and vertical head turns  Hip bumps and return to upright without use of hands    High kneeling onto air-ex on 4" box - to single foot up to/from standing  LF Leg press, seat at 10, (0#) 2 x 10 (cues to slow speed and get to full knee ext each rep)  2/12 Nu-step L5 5 min  Tricpes press down 3x15 20 lbs  Life fitness row 3x15 30 lbs  Hip abduction machine 3x15   Air-ex:  Step on and off x20  Tandem stance 2x30 sec each leg  Narrow base x20 sec each leg  Forward and backwards rocking x20  Hurdle x20   mod a for assist with all balance exercises and gait belt.  2/8 Pt seen for aquatic therapy today.  Treatment took place in water 3.25-4.5 ft in depth at the [facility] pool. Temp of water was 91.  Pt entered/exited the pool via stairs with supervision with bilat rail.  * holding white barbell - forward/ backward gait, side stepping - cues for increased step length * holding yellow hand float:  forward/backward step; side lunge and return to neutral with single arm reach * holding wall:  squats 5, heel raises x 5 * seated on bench - STS with CGA/min A x 5, unsteady * return to walking forward/ backward  without UE support  * squat to raise arms overhead x 5  Pt requires the buoyancy and hydrostatic pressure of water for support, and to offload joints by unweighting joint load by at least 50 % in navel deep water and by at least 75-80% in chest to neck deep water.  Viscosity of the water is needed for resistance of strengthening. Water current perturbations provides challenge to standing balance requiring increased core activation.  2/5 Nu-step: 5 min L4 seat 7  #1 Life Fitness 25 lbs 3x15  Knee extension life fitness 15 lbs 3x15  Hip abduction life fitness 55 3x15   Reviewed set up of gym equipment and how to get on ana doff machines   Performed re-assessment on the patient. Reviewed test and measures and goals including strength testing, FOTO, and ROM   09/05/22:  CGA provided for safe gait between machines NuStep seat 7, UE #9: L2 (90-100SPM)- 5 min total Cybex lat pull down- 10# x 15, x 2 Cybex Row machine: (chest pad at 3) 13.5#  x 10 x 2 Cybex chest press-  0# x 10 - (should break into 2 sets of 5 next time) Cybex tricep ext - 0# x 10 x 2 LF Seated leg press 0#, x 10 x 2, cues for full range LF hip abdct 35# x  20 x2     PATIENT EDUCATION:  Education details: modifications to wts on machines; protection of joints by controlling motion / speed when using wt machines. Person educated: Patient Education method: Explanation, Demonstration, Tactile cues, Verbal cues Education comprehension: verbalized understanding, returned demonstration, verbal cues required, tactile cues required, and needs further education   HOME EXERCISE PROGRAM:  has a program already; aquatic HEP TBD  ASSESSMENT:  CLINICAL IMPRESSION: Th patient demonstrated better balance with gym exercises today getting on and off the equipment. We kept her to just 3 exercises and the nu-step. She was advised to monitor her response in her legs over the next few days. We will advance patient as tolerated.  OBJECTIVE  IMPAIRMENTS Abnormal gait, decreased activity tolerance, decreased balance, decreased mobility, difficulty walking, decreased strength, impaired tone, and pain.   ACTIVITY LIMITATIONS carrying, lifting, bending, standing, squatting, stairs, transfers, bed mobility, dressing, and locomotion level  PARTICIPATION LIMITATIONS: meal prep, cleaning, laundry, driving, shopping, community activity, and yard work  PERSONAL FACTORS Fibromyalgia; headaches, LBBB; osteopenia  are also affecting patient's functional outcome.   REHAB POTENTIAL: Good  CLINICAL DECISION MAKING: Evolving/moderate complexity increasing pain in her quads   EVALUATION COMPLEXITY: Moderate   GOALS: Goals reviewed with patient? Yes  SHORT TERM GOALS: Target date: 07/13/2022 Patient will transfer sit to stand smoothly and independently  Baseline: Goal status: Worked on technique with sit to stand transfer today improving  2.  Patient will increase gross strength by 5lbs  Baseline:  Goal status: Met for most muscle groups 12/11  3.  Patient will be independent with base pool program  Baseline:  Goal status: Progressing well towards goals 12/11  LONG TERM GOALS: Target date: 11/14/2022  Patient will go improve balance to CGA for all balance exercises to improve safety  Baseline:  Goal status: Improved to contact-guard today with static balance will progress towards more dynamic balance 12/11  2.  Patient will reach FOTO goal to demonstrate improved function  Baseline:  Goal status: Decreased Foto score but after review of answers initial photo was probably not answered correctly 12/11  3.  Patient will have a full balance program  Baseline:  Goal status: Will work more on the balance program as she progressed more to land 12/11   PLAN: PT FREQUENCY: 2x/week  PT DURATION: 8 weeks  PLANNED INTERVENTIONS: Therapeutic exercises, Therapeutic activity, Neuromuscular re-education, Balance training, Gait training,  Patient/Family education, Self Care, Joint mobilization, Stair training, Aquatic Therapy, Cryotherapy, Moist heat, Taping, Manual therapy, and Re-evaluation  PLAN FOR NEXT SESSION: continue with gait and balance training. Be aware of bradycardia.    Mayer Camel, PTA 10/18/22 12:24 PM [facility] [address] [place], [place], [address] Phone: [phone]   Fax:  [phone]

## 2022-10-17 NOTE — Telephone Encounter (Signed)
Called pateint and he meant Morgan [name] and I would send in the paperwork for the inbrija hub. I had in our records patient didn't like the inbrija an didn't want to continue she now wants to

## 2022-10-18 ENCOUNTER — Encounter (HOSPITAL_BASED_OUTPATIENT_CLINIC_OR_DEPARTMENT_OTHER): Payer: Self-pay | Admitting: Physical Therapy

## 2022-10-18 ENCOUNTER — Ambulatory Visit (INDEPENDENT_AMBULATORY_CARE_PROVIDER_SITE_OTHER): Payer: Medicare Other | Admitting: Family Medicine

## 2022-10-18 VITALS — BP 102/72 | HR 91 | Ht 64.0 in | Wt 138.0 lb

## 2022-10-18 DIAGNOSIS — R5381 Other malaise: Secondary | ICD-10-CM | POA: Diagnosis not present

## 2022-10-18 DIAGNOSIS — M545 Low back pain, unspecified: Secondary | ICD-10-CM

## 2022-10-18 NOTE — Patient Instructions (Signed)
Keep doing what you are doing When you get labs let's check : Uric Acid Vit D B12 TSH Free T3 Free T4 See me in April

## 2022-10-18 NOTE — Assessment & Plan Note (Signed)
Patient has this as well as underlying Parkinson's disease, paralysis agitans as well as fibromyalgia that are all contributing.  Patient does have some difficulty with her work ethic per her son.  Discussed with patient about continuing to stay active.  She does believe that she has some difficulty doing that on her own and would like to continue with the home exercises but also of the aquatic therapy for now.  Will see how patient responds.  Increase activity as tolerated.  Follow-up with me again in 2 to 3 months.  Total time reviewing patient's notes as well as discussing with patient and caregiver 32 minutes

## 2022-10-19 DIAGNOSIS — I10 Essential (primary) hypertension: Secondary | ICD-10-CM | POA: Diagnosis not present

## 2022-10-19 DIAGNOSIS — R202 Paresthesia of skin: Secondary | ICD-10-CM | POA: Diagnosis not present

## 2022-10-19 DIAGNOSIS — E559 Vitamin D deficiency, unspecified: Secondary | ICD-10-CM | POA: Diagnosis not present

## 2022-10-19 DIAGNOSIS — G20B2 Parkinson's disease with dyskinesia, with fluctuations: Secondary | ICD-10-CM | POA: Diagnosis not present

## 2022-10-19 DIAGNOSIS — E785 Hyperlipidemia, unspecified: Secondary | ICD-10-CM | POA: Diagnosis not present

## 2022-10-19 DIAGNOSIS — M2550 Pain in unspecified joint: Secondary | ICD-10-CM | POA: Diagnosis not present

## 2022-10-19 LAB — LAB REPORT - SCANNED: EGFR: 63

## 2022-10-20 ENCOUNTER — Ambulatory Visit (HOSPITAL_BASED_OUTPATIENT_CLINIC_OR_DEPARTMENT_OTHER): Payer: Medicare Other | Admitting: Physical Therapy

## 2022-10-20 ENCOUNTER — Encounter (HOSPITAL_BASED_OUTPATIENT_CLINIC_OR_DEPARTMENT_OTHER): Payer: Self-pay | Admitting: Physical Therapy

## 2022-10-20 DIAGNOSIS — R2689 Other abnormalities of gait and mobility: Secondary | ICD-10-CM | POA: Diagnosis not present

## 2022-10-20 DIAGNOSIS — G20A1 Parkinson's disease without dyskinesia, without mention of fluctuations: Secondary | ICD-10-CM | POA: Diagnosis not present

## 2022-10-20 NOTE — Therapy (Signed)
OUTPATIENT PHYSICAL THERAPY LOWER EXTREMITY TREATMENT   Patient Name: Morgan [name] MRN: [id number] DOB:Nov 02, 1950, 72 y.o., female Today's Date: 10/20/2022   PT End of Session - 10/20/22 1118     Visit Number 23    Number of Visits 30    Date for PT Re-Evaluation 11/14/22    Authorization Type progress note done on visit 27    PT Start Time 1118    PT Stop Time 1145    PT Time Calculation (min) 27 min    Activity Tolerance Patient limited by fatigue    Behavior During Therapy WFL for tasks assessed/performed                  Past Medical History:  Diagnosis Date   Anxiety    Fibromyalgia    Headache    Hypercholesteremia    controlled on medication   Hyperlipidemia    Hypertension    no current meds   LBBB (left bundle branch block)    Melanoma (HCC)    MVP (mitral valve prolapse)    Osteopenia    Parkinson disease    Pneumonia    SVD (spontaneous vaginal delivery)    x 2   Past Surgical History:  Procedure Laterality Date   BLEPHAROPLASTY     cold knife conization     COLONOSCOPY     FH colon CA   CYSTOSCOPY W/ URETERAL STENT PLACEMENT Left 10/18/2021   Procedure: CYSTOSCOPY WITH RETROGRADE PYELOGRAM, URETERAL STENT PLACEMENT;  Surgeon: Festus Aloe, MD;  Location: [facility];  Service: Urology;  Laterality: Left;   DILATATION & CURETTAGE/HYSTEROSCOPY WITH MYOSURE N/A 02/14/2018   Procedure: ATTEMPTED DILATATION & CURETTAGE/HYSTEROSCOPY WITH MYOSURE;  Surgeon: Christophe Louis, MD;  Location: [facility];  Service: Gynecology;  Laterality: N/A;  polyp   melanoma cancer     removed from left leg   RECONSTRUCTION OF EYELID     TONSILLECTOMY     TUBAL LIGATION     URETEROSCOPY WITH HOLMIUM LASER LITHOTRIPSY Left 12/17/2021   Procedure: URETEROSCOPY/HOLMIUM LASER/STENT EXCHANGE;  Surgeon: Festus Aloe, MD;  Location: [facility];  Service: Urology;  Laterality: Left;   WISDOM TOOTH EXTRACTION     Patient Active Problem List   Diagnosis Date Noted    COVID-19 virus infection 05/16/2022   HTN (hypertension) 10/20/2021   Physical deconditioning 10/20/2021   Hydronephrosis with infection 10/18/2021   Bacteremia due to Escherichia coli 10/18/2021   Severe sepsis ([place]) 10/17/2021   UTI (urinary tract infection) [id number]   Acute metabolic encephalopathy [id number]   Parkinson disease 10/17/2021   Cervical stenosis (uterine cervix) 02/14/2018   Postmenopausal bleeding 02/14/2018   LBBB (left bundle branch block) 12/25/2015   PVCs (premature ventricular contractions) 12/25/2015   Hyperlipidemia 12/25/2015   Fibromyalgia 01/16/2014   Cholelithiasis 04/25/2013   Paralysis agitans 01/15/2013   Unspecified hereditary and idiopathic peripheral neuropathy 01/15/2013   [name] MD PCP:   Loistine Chance PROVIDER: Hulan Saas MD   REFERRING DIAG: Parkinsons   THERAPY DIAG:  Other abnormalities of gait and mobility  Parkinson's disease without dyskinesia or fluctuating manifestations  Rationale for Evaluation and Treatment Rehabilitation  ONSET DATE: Several years   SUBJECTIVE:   SUBJECTIVE STATEMENT: Pt reports her legs feel fine today.  Per her husband, they are still adjusting her medications. No recent falls. She does NOT want to do leg press, "I think that's the one that makes my legs hurt"   From EVAL: The patient has had Parkinsons for many years. She  has had land therapy with varying degrees of success. She has recently had an onset of bilateral leg pain that comes and goes. The pain is mostly in the front but can go down to the back as well. She is a member of the gym. She has frequent falls.   PERTINENT HISTORY: Fibromyalgia; headaches, LBBB; osteopenia   PAIN:  Are you having pain? no: NPRS scale:0/10 Pain location:  Pain description:  Aggravating factors: just comes and goes  Relieving factors: just comes and goes; sometimes ibuprofin works but she is limited as to how much she can take  PRECAUTIONS: Patient  reports that because of her heart she will pass out at times.   WEIGHT BEARING RESTRICTIONS No  FALLS:  Has patient fallen in last 6 months? Yes. Number of falls 3-4 most recently slipped on something in the closet and fell face first    LIVING ENVIRONMENT: Stairs to the second floor in her house but she dosen't do much  4 steps in the front  2 steps in the garage  OCCUPATION: retired   Office manager: reading; goes to an exercise class   PLOF: Independent with household mobility with device  PATIENT GOALS  To have less pain in her legs and to move better    OBJECTIVE: * findings taken at evaluation, unless otherwise noted.   DIAGNOSTIC FINDINGS:   PATIENT SURVEYS:  FOTO    COGNITION:  Overall cognitive status: Within functional limits for tasks assessed     SENSATION: Denies paresthesias   POSTURE: No Significant postural limitations  PALPATION: No trigger points noted   LOWER EXTREMITY ROM:  Passive ROM Right eval Left eval  Hip flexion    Hip extension    Hip abduction    Hip adduction    Hip internal rotation    Hip external rotation    Knee flexion    Knee extension    Ankle dorsiflexion    Ankle plantarflexion    Ankle inversion    Ankle eversion     (Blank rows = not tested)  WNL  LOWER EXTREMITY MMT:  MMT Right eval Left eval right left Right  2/5 Left  2/5  Hip flexion 21.7 29.7 29.6 35.1 29.3 32.1  Hip extension        Hip abduction 27.6 18.5 20.7 22.4 24.1 34.2  Hip adduction        Hip internal rotation        Hip external rotation        Knee flexion        Knee extension 23.5 20.2 27.4 25.5 32.5 26.7  Ankle dorsiflexion        Ankle plantarflexion        Ankle inversion        Ankle eversion         (Blank rows = not tested)  GAIT: Decreased hip flexion; leans to the left; right hip frop. Patient required CGA while walking and min a when she turned for balance.   Functional tests:  Sit to stand: uses's hands and needs  several trials to stand from the table. Poor intial standing balance 2/5 poor psoterior balance upon initial standing   12/11 5x sit to stand: 20 seconds  2/5:  5xSTS:  11 seconds with guarding for posterior balance    BALANCE: 08/08/22 Narrow base min A  Narrow base eyes closed min to mod A  Tandem stance Min A both ways.   2/5 Balance   TODAY'S  TREATMENT: 2/22 NuStep L3 x 6 min, LE/UE Cybex lat pull down 3x15, 15# Cybex tricep 0#, 2 x15# Cybex chest press 0#, 2 x5 Cybex row, 30# 2 x 15  2/19 NuStep L3 x 6:00, UE and LE Leg press 15 lbs 3x15  Tricpes press down 15 lbs 3x15  Row machine 3x15 15 lbs   Reviewed setup and safety with machines   2/15 NuStep L3 x 5:45, UE and LE  Air-ex by counter with use of gait belt (CGA/SBA):   -Forward step on/off with cues to slow down and work on steadying once on cushion; intermittent UE on counter   -lateral step ups  -NBOS with horiz and vertical head turns  Hip bumps and return to upright without use of hands    High kneeling onto air-ex on 4" box - to single foot up to/from standing  LF Leg press, seat at 10, (0#) 2 x 10 (cues to slow speed and get to full knee ext each rep)  2/12 Nu-step L5 5 min  Tricpes press down 3x15 20 lbs  Life fitness row 3x15 30 lbs  Hip abduction machine 3x15   Air-ex:  Step on and off x20  Tandem stance 2x30 sec each leg  Narrow base x20 sec each leg  Forward and backwards rocking x20  Hurdle x20   mod a for assist with all balance exercises and gait belt.  2/8 Pt seen for aquatic therapy today.  Treatment took place in water 3.25-4.5 ft in depth at the [facility]. Temp of water was 91.  Pt entered/exited the pool via stairs with supervision with bilat rail.  * holding white barbell - forward/ backward gait, side stepping - cues for increased step length * holding yellow hand float:  forward/backward step; side lunge and return to neutral with single arm reach * holding  wall:  squats 5, heel raises x 5 * seated on bench - STS with CGA/min A x 5, unsteady * return to walking forward/ backward without UE support  * squat to raise arms overhead x 5  Pt requires the buoyancy and hydrostatic pressure of water for support, and to offload joints by unweighting joint load by at least 50 % in navel deep water and by at least 75-80% in chest to neck deep water.  Viscosity of the water is needed for resistance of strengthening. Water current perturbations provides challenge to standing balance requiring increased core activation.  2/5 Nu-step: 5 min L4 seat 7  #1 Life Fitness 25 lbs 3x15  Knee extension life fitness 15 lbs 3x15  Hip abduction life fitness 55 3x15   Reviewed set up of gym equipment and how to get on ana doff machines   Performed re-assessment on the patient. Reviewed test and measures and goals including strength testing, FOTO, and ROM   09/05/22:  CGA provided for safe gait between machines NuStep seat 7, UE #9: L2 (90-100SPM)- 5 min total Cybex lat pull down- 10# x 15, x 2 Cybex Row machine: (chest pad at 3) 13.5#  x 10 x 2 Cybex chest press-  0# x 10 - (should break into 2 sets of 5 next time) Cybex tricep ext - 0# x 10 x 2 LF Seated leg press 0#, x 10 x 2, cues for full range LF hip abdct 35# x  20 x2     PATIENT EDUCATION:  Education details: modifications to wts on machines; protection of joints by controlling motion / speed when using wt  machines. Person educated: Patient, pt's spouse Education method: Explanation, Demonstration, Tactile cues, Verbal cues Education comprehension: verbalized understanding, returned demonstration, verbal cues required, tactile cues required, and needs further education   HOME EXERCISE PROGRAM:  has a program already  ASSESSMENT:  CLINICAL IMPRESSION: Pt's husband is present during the later portion of session, to observe (per his request) so that he may better assist her when she transitions to more  independent exercise at [facility].  Pt requires CGA for transitioning off machines and with gait between machines. No increase in pain, just fatigue reported. Minor cues for keeping track of reps.  She was advised to monitor her response in her legs over the next few days.  We will advance patient as tolerated.    OBJECTIVE IMPAIRMENTS Abnormal gait, decreased activity tolerance, decreased balance, decreased mobility, difficulty walking, decreased strength, impaired tone, and pain.   ACTIVITY LIMITATIONS carrying, lifting, bending, standing, squatting, stairs, transfers, bed mobility, dressing, and locomotion level  PARTICIPATION LIMITATIONS: meal prep, cleaning, laundry, driving, shopping, community activity, and yard work  PERSONAL FACTORS Fibromyalgia; headaches, LBBB; osteopenia  are also affecting patient's functional outcome.   REHAB POTENTIAL: Good  CLINICAL DECISION MAKING: Evolving/moderate complexity increasing pain in her quads   EVALUATION COMPLEXITY: Moderate   GOALS: Goals reviewed with patient? Yes  SHORT TERM GOALS: Target date: 07/13/2022 Patient will transfer sit to stand smoothly and independently  Baseline: Goal status: Worked on technique with sit to stand transfer today improving  2.  Patient will increase gross strength by 5lbs  Baseline:  Goal status: Met for most muscle groups 12/11  3.  Patient will be independent with base pool program  Baseline:  Goal status: Progressing well towards goals 12/11  LONG TERM GOALS: Target date: 11/14/2022  Patient will go improve balance to CGA for all balance exercises to improve safety  Baseline:  Goal status: Improved to contact-guard today with static balance will progress towards more dynamic balance 12/11  2.  Patient will reach FOTO goal to demonstrate improved function  Baseline:  Goal status: Decreased Foto score but after review of answers initial photo was probably not answered correctly 12/11  3.   Patient will have a full balance program  Baseline:  Goal status: Will work more on the balance program as she progressed more to land 12/11   PLAN: PT FREQUENCY: 2x/week  PT DURATION: 8 weeks  PLANNED INTERVENTIONS: Therapeutic exercises, Therapeutic activity, Neuromuscular re-education, Balance training, Gait training, Patient/Family education, Self Care, Joint mobilization, Stair training, Aquatic Therapy, Cryotherapy, Moist heat, Taping, Manual therapy, and Re-evaluation  PLAN FOR NEXT SESSION: continue with gait and balance training. Be aware of bradycardia.    Kerin Perna, PTA 10/20/22 12:04 PM [facility] [address] [place], [place], [address] Phone: [phone]   Fax:  [phone]

## 2022-10-21 DIAGNOSIS — H2513 Age-related nuclear cataract, bilateral: Secondary | ICD-10-CM | POA: Diagnosis not present

## 2022-10-21 DIAGNOSIS — H5213 Myopia, bilateral: Secondary | ICD-10-CM | POA: Diagnosis not present

## 2022-10-21 DIAGNOSIS — H353132 Nonexudative age-related macular degeneration, bilateral, intermediate dry stage: Secondary | ICD-10-CM | POA: Diagnosis not present

## 2022-10-24 ENCOUNTER — Ambulatory Visit (HOSPITAL_BASED_OUTPATIENT_CLINIC_OR_DEPARTMENT_OTHER): Payer: Medicare Other | Admitting: Physical Therapy

## 2022-10-24 DIAGNOSIS — G20A1 Parkinson's disease without dyskinesia, without mention of fluctuations: Secondary | ICD-10-CM

## 2022-10-24 DIAGNOSIS — R2689 Other abnormalities of gait and mobility: Secondary | ICD-10-CM

## 2022-10-24 NOTE — Therapy (Signed)
OUTPATIENT PHYSICAL THERAPY LOWER EXTREMITY TREATMENT   Patient Name: Morgan [name] MRN: [id number] DOB:[birth year], 72 y.o., female Today's Date: 10/20/2022   PT End of Session - 10/20/22 1118     Visit Number 23    Number of Visits 30    Date for PT Re-Evaluation 11/14/22    Authorization Type progress note done on visit 18    PT Start Time 1118    PT Stop Time 1145    PT Time Calculation (min) 27 min    Activity Tolerance Patient limited by fatigue    Behavior During Therapy [facility] for tasks assessed/performed           Progress Note Reporting Period 08/09/2023 to 10/24/2022  See note below for Objective Data and Assessment of Progress/Goals.            Past Medical History:  Diagnosis Date   Anxiety    Fibromyalgia    Headache    Hypercholesteremia    controlled on medication   Hyperlipidemia    Hypertension    no current meds   LBBB (left bundle branch block)    Melanoma (HCC)    MVP (mitral valve prolapse)    Osteopenia    Parkinson disease    Pneumonia    SVD (spontaneous vaginal delivery)    x 2   Past Surgical History:  Procedure Laterality Date   BLEPHAROPLASTY     cold knife conization     COLONOSCOPY     FH colon CA   CYSTOSCOPY W/ URETERAL STENT PLACEMENT Left 10/18/2021   Procedure: CYSTOSCOPY WITH RETROGRADE PYELOGRAM, URETERAL STENT PLACEMENT;  Surgeon: Festus Aloe, MD;  Location: [facility];  Service: Urology;  Laterality: Left;   DILATATION & CURETTAGE/HYSTEROSCOPY WITH MYOSURE N/A 02/14/2018   Procedure: ATTEMPTED DILATATION & CURETTAGE/HYSTEROSCOPY WITH MYOSURE;  Surgeon: Christophe Louis, MD;  Location: [facility];  Service: Gynecology;  Laterality: N/A;  polyp   melanoma cancer     removed from left leg   RECONSTRUCTION OF EYELID     TONSILLECTOMY     TUBAL LIGATION     URETEROSCOPY WITH HOLMIUM LASER LITHOTRIPSY Left 12/17/2021   Procedure: URETEROSCOPY/HOLMIUM LASER/STENT EXCHANGE;  Surgeon: Festus Aloe, MD;  Location: [facility];   Service: Urology;  Laterality: Left;   WISDOM TOOTH EXTRACTION     Patient Active Problem List   Diagnosis Date Noted   COVID-19 virus infection 05/16/2022   HTN (hypertension) 10/20/2021   Physical deconditioning 10/20/2021   Hydronephrosis with infection 10/18/2021   Bacteremia due to Escherichia coli 10/18/2021   Severe sepsis ([place]) 10/17/2021   UTI (urinary tract infection) [id number]   Acute metabolic encephalopathy A999333   Parkinson disease 10/17/2021   Cervical stenosis (uterine cervix) 02/14/2018   Postmenopausal bleeding 02/14/2018   LBBB (left bundle branch block) 12/25/2015   PVCs (premature ventricular contractions) 12/25/2015   Hyperlipidemia 12/25/2015   Fibromyalgia 01/16/2014   Cholelithiasis 04/25/2013   Paralysis agitans 01/15/2013   Unspecified hereditary and idiopathic peripheral neuropathy 01/15/2013   [name] MD PCP:   Loistine Chance PROVIDER: Hulan Saas MD   REFERRING DIAG: Parkinsons   THERAPY DIAG:  Other abnormalities of gait and mobility  Parkinson's disease without dyskinesia or fluctuating manifestations  Rationale for Evaluation and Treatment Rehabilitation  ONSET DATE: Several years   SUBJECTIVE:   SUBJECTIVE STATEMENT: The patient reports her legs were a little sore this morning but they have improved since taking medication. They continue to work on adjusting medications.   From EVAL:  The patient has had Parkinsons for many years. She has had land therapy with varying degrees of success. She has recently had an onset of bilateral leg pain that comes and goes. The pain is mostly in the front but can go down to the back as well. She is a member of the gym. She has frequent falls.   PERTINENT HISTORY: Fibromyalgia; headaches, LBBB; osteopenia   PAIN:  Are you having pain? no: NPRS scale:0/10 Pain location:  Pain description:  Aggravating factors: just comes and goes  Relieving factors: just comes and goes; sometimes  ibuprofin works but she is limited as to how much she can take  PRECAUTIONS: Patient reports that because of her heart she will pass out at times.   WEIGHT BEARING RESTRICTIONS No  FALLS:  Has patient fallen in last 6 months? Yes. Number of falls 3-4 most recently slipped on something in the closet and fell face first    LIVING ENVIRONMENT: Stairs to the second floor in her house but she dosen't do much  4 steps in the front  2 steps in the garage  OCCUPATION: retired   Office manager: reading; goes to an exercise class   PLOF: Independent with household mobility with device  PATIENT GOALS  To have less pain in her legs and to move better    OBJECTIVE: * findings taken at evaluation, unless otherwise noted.   DIAGNOSTIC FINDINGS:   PATIENT SURVEYS:  FOTO    COGNITION:  Overall cognitive status: Within functional limits for tasks assessed     SENSATION: Denies paresthesias   POSTURE: No Significant postural limitations  PALPATION: No trigger points noted   LOWER EXTREMITY ROM:  Passive ROM Right eval Left eval  Hip flexion    Hip extension    Hip abduction    Hip adduction    Hip internal rotation    Hip external rotation    Knee flexion    Knee extension    Ankle dorsiflexion    Ankle plantarflexion    Ankle inversion    Ankle eversion     (Blank rows = not tested)  WNL  LOWER EXTREMITY MMT:  MMT Right eval Left eval right left Right  2/5 Left  2/5 Right  2/26 Left 2/26  Hip flexion 21.7 29.7 29.6 35.1 29.3 32.1 35.7 33.8  Hip extension          Hip abduction 27.6 18.5 20.7 22.4 24.1 34.2 26.8 22.8  Hip adduction          Hip internal rotation          Hip external rotation          Knee flexion          Knee extension 23.5 20.2 27.4 25.5 32.5 26.7 31.5 30.1  Ankle dorsiflexion          Ankle plantarflexion          Ankle inversion          Ankle eversion           (Blank rows = not tested)  GAIT: Decreased hip flexion; leans to the left;  right hip frop. Patient required CGA while walking and min a when she turned for balance.   Functional tests:  Sit to stand: uses's hands and needs several trials to stand from the table. Poor intial standing balance 2/5 poor psoterior balance upon initial standing   12/11 5x sit to stand: 20 seconds  2/5:  5xSTS:  11  seconds with guarding for posterior balance   2/26 5x sit to stand 5 sec   Narrow base min A  Narrow base eyes closed min to mod A  Tandem stance Min A both ways.    BALANCE: 08/08/22 Narrow base min A  Narrow base eyes closed min to mod A  Tandem stance Min A both ways.   2/5 Balance   TODAY'S TREATMENT: 2/26 LF Leg press 3x15 10 lbs  LF hip abduction machine 3x15  LF row machine 3x15 20 lbs   Reviewed sheet with patient and her husband. They were advised we will put it in the file with the other exercises   Performed re-assessment on the patient. Reviewed test and measures and goals including strength testing, FOTO, and ROM   Reviewed results of testing and performed balance testing with the patient he had Mallery sick today and  2/22 NuStep L3 x 6 min, LE/UE Cybex lat pull down 3x15, 15# Cybex tricep 0#, 2 x15# Cybex chest press 0#, 2 x5 Cybex row, 30# 2 x 15  2/19 NuStep L3 x 6:00, UE and LE Leg press 15 lbs 3x15  Tricpes press down 15 lbs 3x15  Row machine 3x15 15 lbs   Reviewed setup and safety with machines   2/15 NuStep L3 x 5:45, UE and LE  Air-ex by counter with use of gait belt (CGA/SBA):   -Forward step on/off with cues to slow down and work on steadying once on cushion; intermittent UE on counter   -lateral step ups  -NBOS with horiz and vertical head turns  Hip bumps and return to upright without use of hands    High kneeling onto air-ex on 4" box - to single foot up to/from standing  LF Leg press, seat at 10, (0#) 2 x 10 (cues to slow speed and get to full knee ext each rep)  2/12 Nu-step L5 5 min  Tricpes press down  3x15 20 lbs  Life fitness row 3x15 30 lbs  Hip abduction machine 3x15   Air-ex:  Step on and off x20  Tandem stance 2x30 sec each leg  Narrow base x20 sec each leg  Forward and backwards rocking x20  Hurdle x20   mod a for assist with all balance exercises and gait belt.  2/8 Pt seen for aquatic therapy today.  Treatment took place in water 3.25-4.5 ft in depth at the [facility]. Temp of water was 91.  Pt entered/exited the pool via stairs with supervision with bilat rail.  * holding white barbell - forward/ backward gait, side stepping - cues for increased step length * holding yellow hand float:  forward/backward step; side lunge and return to neutral with single arm reach * holding wall:  squats 5, heel raises x 5 * seated on bench - STS with CGA/min A x 5, unsteady * return to walking forward/ backward without UE support  * squat to raise arms overhead x 5  Pt requires the buoyancy and hydrostatic pressure of water for support, and to offload joints by unweighting joint load by at least 50 % in navel deep water and by at least 75-80% in chest to neck deep water.  Viscosity of the water is needed for resistance of strengthening. Water current perturbations provides challenge to standing balance requiring increased core activation.  2/5 Nu-step: 5 min L4 seat 7  #1 Life Fitness 25 lbs 3x15  Knee extension life fitness 15 lbs 3x15  Hip abduction life fitness 55  3x15   Reviewed set up of gym equipment and how to get on ana doff machines   Performed re-assessment on the patient. Reviewed test and measures and goals including strength testing, FOTO, and ROM   09/05/22:  CGA provided for safe gait between machines NuStep seat 7, UE #9: L2 (90-100SPM)- 5 min total Cybex lat pull down- 10# x 15, x 2 Cybex Row machine: (chest pad at 3) 13.5#  x 10 x 2 Cybex chest press-  0# x 10 - (should break into 2 sets of 5 next time) Cybex tricep ext - 0# x 10 x 2 LF Seated leg  press 0#, x 10 x 2, cues for full range LF hip abdct 35# x  20 x2     PATIENT EDUCATION:  Education details: modifications to wts on machines; protection of joints by controlling motion / speed when using wt machines. Person educated: Patient, pt's spouse Education method: Explanation, Demonstration, Tactile cues, Verbal cues Education comprehension: verbalized understanding, returned demonstration, verbal cues required, tactile cues required, and needs further education   HOME EXERCISE PROGRAM:  has a program already  ASSESSMENT:  CLINICAL IMPRESSION: Therapy performed a re-assemment on the patient today. Overall she is making progress. She has about 75% of her gym program built out at this point. Her husband will be helping her in the gym. We worked on keeping a rhythm today with her leg press that way she is not going too fast. She requires some cuing. Overall her strength numbers are trending up. Her static balance has improved. On certain days her dynamic balance is better then other days. She will be having a cataract surgery in 2 weeks. After she can get back to therapy will will finalize her program. She will also re-start her Parkinson classes. We will continue 2W6.  OBJECTIVE IMPAIRMENTS Abnormal gait, decreased activity tolerance, decreased balance, decreased mobility, difficulty walking, decreased strength, impaired tone, and pain.   ACTIVITY LIMITATIONS carrying, lifting, bending, standing, squatting, stairs, transfers, bed mobility, dressing, and locomotion level  PARTICIPATION LIMITATIONS: meal prep, cleaning, laundry, driving, shopping, community activity, and yard work  PERSONAL FACTORS Fibromyalgia; headaches, LBBB; osteopenia  are also affecting patient's functional outcome.   REHAB POTENTIAL: Good  CLINICAL DECISION MAKING: Evolving/moderate complexity increasing pain in her quads   EVALUATION COMPLEXITY: Moderate   GOALS: Goals reviewed with patient?  Yes  SHORT TERM GOALS: Target date: 07/13/2022 Patient will transfer sit to stand smoothly and independently  Baseline: Goal status: Worked on technique with sit to stand transfer today improving  2.  Patient will increase gross strength by 5lbs  Baseline:  Goal status: Met for most muscle groups 12/11  3.  Patient will be independent with base pool program  Baseline:  Goal status: Progressing well towards goals 12/11  LONG TERM GOALS: Target date: 11/14/2022  Patient will go improve balance to CGA for all balance exercises to improve safety  Baseline:  Goal status: Improved to contact-guard today with static balance will progress towards more dynamic balance 12/11  2.  Patient will reach FOTO goal to demonstrate improved function  Baseline:  Goal status: Decreased Foto score but after review of answers initial photo was probably not answered correctly 12/11  3.  Patient will have a full balance program  Baseline:  Goal status: Will work more on the balance program as she progressed more to land 12/11   PLAN: PT FREQUENCY: 2x/week  PT DURATION: 8 weeks  PLANNED INTERVENTIONS: Therapeutic exercises, Therapeutic activity, Neuromuscular  re-education, Balance training, Gait training, Patient/Family education, Self Care, Joint mobilization, Stair training, Aquatic Therapy, Cryotherapy, Moist heat, Taping, Manual therapy, and Re-evaluation  PLAN FOR NEXT SESSION: continue with gait and balance training.   Carolyne Littles PT DPT   10/20/22 12:04 PM [facility] [address] [place], [place], [address] Phone: [phone]   Fax:  [phone]

## 2022-10-25 ENCOUNTER — Encounter (HOSPITAL_BASED_OUTPATIENT_CLINIC_OR_DEPARTMENT_OTHER): Payer: Self-pay | Admitting: Physical Therapy

## 2022-10-26 ENCOUNTER — Encounter: Payer: Self-pay | Admitting: Neurology

## 2022-10-26 ENCOUNTER — Telehealth: Payer: Self-pay | Admitting: Neurology

## 2022-10-26 NOTE — Telephone Encounter (Signed)
Pt's husband called stating he would like to speak to someone about wife's medication issue.

## 2022-10-26 NOTE — Telephone Encounter (Signed)
Patient husband called and left a VM stating that he wants to speak with someone about the medication reduction and the inhaler RX

## 2022-10-27 ENCOUNTER — Telehealth: Payer: Self-pay

## 2022-10-27 ENCOUNTER — Other Ambulatory Visit: Payer: Self-pay

## 2022-10-27 ENCOUNTER — Ambulatory Visit (HOSPITAL_BASED_OUTPATIENT_CLINIC_OR_DEPARTMENT_OTHER): Payer: Medicare Other | Admitting: Physical Therapy

## 2022-10-27 MED ORDER — PRAMIPEXOLE DIHYDROCHLORIDE ER 0.75 MG PO TB24: 1.0000 | ORAL_TABLET | Freq: Every day | ORAL | 0 refills | Status: DC

## 2022-10-27 NOTE — Telephone Encounter (Signed)
Called Pieter Partridge Degges at [phone] and I am checking on the paperwork for this patient to receive Inbrija

## 2022-10-27 NOTE — Telephone Encounter (Signed)
I have called patient and have addressed medication questions

## 2022-10-27 NOTE — Telephone Encounter (Signed)
Called patient this morning. Patient hs stopped taking her Pramipexole and I will send in a new prescription to [facility]. Patient would also like samples of Inbrija. Patients husband upset and frustrated had to be asked to leave conversation and let me speak to patient after he was disrespectful. I will get meds together and call patients husband to pick up from our office

## 2022-10-27 NOTE — Telephone Encounter (Signed)
Patient called asking if the samples were ready, [name] advised they were not and that she would call them as soon as they are. Alegandra verbalized understanding and will wait for the call.

## 2022-10-27 NOTE — Telephone Encounter (Signed)
Left message with the after hour service on 10-26-22 @ 5:53 pm  Caller states that she really needs to talk with Dr [name] about medication

## 2022-10-30 ENCOUNTER — Emergency Department (HOSPITAL_BASED_OUTPATIENT_CLINIC_OR_DEPARTMENT_OTHER): Payer: Medicare Other

## 2022-10-30 ENCOUNTER — Emergency Department (HOSPITAL_BASED_OUTPATIENT_CLINIC_OR_DEPARTMENT_OTHER)
Admission: EM | Admit: 2022-10-30 | Discharge: 2022-10-30 | Disposition: A | Discharge status: Home / Self Care | Payer: Medicare Other | Attending: Emergency Medicine | Admitting: Emergency Medicine

## 2022-10-30 ENCOUNTER — Encounter (HOSPITAL_BASED_OUTPATIENT_CLINIC_OR_DEPARTMENT_OTHER): Payer: Self-pay

## 2022-10-30 ENCOUNTER — Other Ambulatory Visit: Payer: Self-pay

## 2022-10-30 DIAGNOSIS — S0990XA Unspecified injury of head, initial encounter: Secondary | ICD-10-CM | POA: Insufficient documentation

## 2022-10-30 DIAGNOSIS — W010XXA Fall on same level from slipping, tripping and stumbling without subsequent striking against object, initial encounter: Secondary | ICD-10-CM | POA: Insufficient documentation

## 2022-10-30 DIAGNOSIS — G20B2 Parkinson's disease with dyskinesia, with fluctuations: Secondary | ICD-10-CM | POA: Insufficient documentation

## 2022-10-30 DIAGNOSIS — W19XXXA Unspecified fall, initial encounter: Secondary | ICD-10-CM | POA: Diagnosis not present

## 2022-10-30 DIAGNOSIS — I10 Essential (primary) hypertension: Secondary | ICD-10-CM | POA: Diagnosis not present

## 2022-10-30 DIAGNOSIS — Z7982 Long term (current) use of aspirin: Secondary | ICD-10-CM | POA: Insufficient documentation

## 2022-10-30 NOTE — ED Provider Notes (Signed)
[facility] Provider Note   CSN: [id number] Arrival date & time: 10/30/22  1957     History  Chief Complaint  Patient presents with   Morgan [name] is a 72 y.o. female.  HPI    72 year old female comes in with chief complaint of mechanical fall.  Patient accompanied by her husband.  Patient slipped over her rug, falling backwards in the process and striking her head.  There was no loss of consciousness, but patient is complaining of headache and left shoulder pain.  She denies any chest pain, shortness of breath, neck pain, abdominal pain.  Patient has history of Parkinson's disease.  Husband is at the bedside.  He states that he has noticed more cognitive decline over the last 6 months and also patient is often doing quite well in the morning, but her symptoms get worse after she takes her medications.  He denies any recent illnesses and patient denies any urinary symptoms.  He was questioning if patient actually has Parkinson's disease.  Patient has past history of sepsis last year.  Home Medications Prior to Admission medications   Medication Sig Start Date End Date Taking? Authorizing Provider  ALPRAZolam Duanne Moron) 0.25 MG tablet Take 0.25 mg by mouth daily as needed for anxiety.     [provider]  aspirin EC 81 MG tablet Take 81 mg by mouth daily. Swallow whole.    [provider]  atorvastatin (LIPITOR) 10 MG tablet Take 10 mg by mouth in the morning.    [provider]  BLINK TEARS 0.25 % SOLN Place 1 drop into both eyes 3 (three) times daily as needed (for dryness).    [provider]  carbidopa-levodopa (SINEMET IR) 25-100 MG tablet 1 tablet every 2 hours starting at 6-7am (getting about 6-7 in per day)The medication have a score line so it can be broken. 10/17/22   [name], Eustace Quail, DO  Cholecalciferol (VITAMIN D3) 25 MCG (1000 UT) CHEW Chew 2,500 Units by mouth daily.    [provider]  Coenzyme Q10-Vitamin E (QUNOL ULTRA COQ10 PO) Take 1 capsule by mouth daily.    [provider]  CYMBALTA 60 MG capsule TAKE 1 CAPSULE EVERY       MORNING 10/17/22   [name], Eustace Quail, DO  gabapentin (NEURONTIN) 400 MG capsule Take 1 capsule (400 mg total) by mouth 3 (three) times daily as needed (for leg pain or soreness). 05/18/22   [name], Lenis Dickinson, MD  Ibuprofen 200 MG CAPS Take 2 capsules (400 mg total) by mouth every 8 (eight) hours as needed (Leg pain, headache or mild pain). 05/18/22   [name], Lenis Dickinson, MD  Levodopa (INBRIJA) 42 MG CAPS You can inhale the capsules as needed up to 5 times per day, separated by 2 hour intervals. 10/17/22   [name], Eustace Quail, DO  methocarbamol (ROBAXIN) 750 MG tablet Take 750 mg by mouth every 6 (six) hours as needed for muscle spasms (or leg pain). 03/25/20   [provider]  midodrine (PROAMATINE) 2.5 MG tablet Take 1 tablet (2.5 mg total) by mouth 3 (three) times daily as needed (for systolic blood pressure less than 100). 06/10/22   Loel Dubonnet, NP  Multiple Vitamins-Minerals (PRESERVISION AREDS 2 PO) Take 1 capsule by mouth 2 (two) times daily.    [provider]  Pramipexole Dihydrochloride 0.75 MG TB24 Take 1 tablet (0.75 mg total) by mouth daily. 10/27/22  Ludwig Clarks, DO  raloxifene (EVISTA) 60 MG tablet Take 60 mg by mouth in the morning.    [provider]  ramelteon (ROZEREM) 8 MG tablet TAKE 1 TABLET AT BEDTIME 10/17/22   [name], [name], DO  TART CHERRY PO Take 3,000 mg by mouth daily.    [provider]      Allergies    Other, Phenergan [promethazine hcl], Acetaminophen, Itraconazole, and Tamsulosin    Review of Systems   Review of Systems  All other systems reviewed and are negative.   Physical Exam Updated Vital Signs BP (!) 166/92   Pulse 81   Ht '5\' 4"'$  (1.626 m)   Wt 68 kg   SpO2 91%   BMI 25.75 kg/m  Physical Exam Vitals and nursing note reviewed.  Constitutional:       Appearance: She is well-developed.  HENT:     Head: Atraumatic.  Eyes:     Extraocular Movements: Extraocular movements intact.     Pupils: Pupils are equal, round, and reactive to light.  Neck:     Comments: No midline c-spine tenderness, pt able to turn head to 45 degrees bilaterally without any pain and able to flex neck to the chest and extend without any pain or neurologic symptoms.  Cardiovascular:     Rate and Rhythm: Normal rate.  Pulmonary:     Effort: Pulmonary effort is normal.  Musculoskeletal:        General: No tenderness or deformity.     Cervical back: Normal range of motion and neck supple.  Skin:    General: Skin is warm and dry.  Neurological:     Mental Status: She is alert and oriented to person, place, and time.     ED Results / Procedures / Treatments   Labs (all labs ordered are listed, but only abnormal results are displayed) Labs Reviewed - No data to display  EKG None  Radiology CT Head Wo Contrast  Result Date: 10/30/2022 CLINICAL DATA:  Head trauma. Patient fell today hitting posterior head. EXAM: CT HEAD WITHOUT CONTRAST TECHNIQUE: Contiguous axial images were obtained from the base of the skull through the vertex without intravenous contrast. RADIATION DOSE REDUCTION: This exam was performed according to the departmental dose-optimization program which includes automated exposure control, adjustment of the mA and/or kV according to patient size and/or use of iterative reconstruction technique. COMPARISON:  CT examination dated May 16, 2022 FINDINGS: Brain: No evidence of acute infarction, hemorrhage, hydrocephalus, extra-axial collection or mass lesion/mass effect. Patchy areas of low-attenuation of the periventricular and subcortical white matter presumed chronic microvascular ischemic changes. Mild generalized cerebral volume loss. Vascular: No hyperdense vessel or unexpected calcification. Skull: Normal. Negative for fracture or focal  lesion. Sinuses/Orbits: No acute finding. Other: None. IMPRESSION: 1. No acute intracranial abnormality. 2. Chronic microvascular ischemic changes of the white matter and mild generalized volume loss, unchanged. Electronically Signed   By: Keane Police D.O.   On: 10/30/2022 22:09    Procedures Procedures    Medications Ordered in ED Medications - No data to display  ED Course/ Medical Decision Making/ A&P                             Medical Decision Making Problems Addressed: Fall, initial encounter: acute illness or injury Parkinson's disease with dyskinesia and fluctuating manifestations: chronic illness or injury with exacerbation, progression, or side effects of treatment  Amount and/or Complexity of  Data Reviewed Radiology: ordered.   72 year old female comes in with chief complaint of mechanical fall.  She has history of Parkinson disease, the fall was a result of balance issues.  Patient was not utilizing any assistive device when walking.  Differential diagnosis includes brain bleed from the trauma, falls secondary to Parkinson's disease, electrolyte imbalance, dehydration.  She is complaining of mild headache.  CT scan of the brain ordered, CT independently interpreted.  There is no evidence of brain bleed.  Rest of her musculoskeletal aches survey was actually reassuring.  No gross deformity.  No significant focal tenderness.  No indication for any radiographs.  C-spine has been cleared clinically.  It appears that patient's Parkinson's disease has worsened.  Patient also has had some cognitive issues that the husband is concerned about.  He is also wondering if patient has a right diagnosis, and if she is on the right medication.  We went over the general disease process in detail.  He denies any recent illnesses.  Patient has been hydrating herself well.  They indicated that patient just had lab workup done by the GP earlier this week and it was normal.  At this time I  mention to the family, that likelihood of emergency medicine having any meaningful positive impact in the matter of this diagnosis is low.  Patient and the husband is understanding and appreciated.  Final Clinical Impression(s) / ED Diagnoses Final diagnoses:  Fall, initial encounter  Parkinson's disease with dyskinesia and fluctuating manifestations    Rx / DC Orders ED Discharge Orders     None         Varney Biles, MD 10/30/22 2249

## 2022-10-30 NOTE — ED Triage Notes (Signed)
Pt presents via EMS with complaint of mechanical fall over rug and hitting back of head on cabinet. No loss of conscious. C/o head pain and left shoulder pain.

## 2022-10-30 NOTE — Discharge Instructions (Signed)
Morgan [name] CT scan does not reveal any evidence of brain bleed. Our thoughts are that likely the fall was a result of balance issues that comes with Parkinson's disease.  As discussed, cognitive dysfunction including dementia and anxiety are both known complications of Parkinson's disease.  However, it is never a bad idea to get a second opinion from another physician if you have any concerns about the diagnosis or management.

## 2022-10-31 DIAGNOSIS — D1721 Benign lipomatous neoplasm of skin and subcutaneous tissue of right arm: Secondary | ICD-10-CM | POA: Diagnosis not present

## 2022-10-31 DIAGNOSIS — Z808 Family history of malignant neoplasm of other organs or systems: Secondary | ICD-10-CM | POA: Diagnosis not present

## 2022-10-31 DIAGNOSIS — D229 Melanocytic nevi, unspecified: Secondary | ICD-10-CM | POA: Diagnosis not present

## 2022-10-31 DIAGNOSIS — Z85820 Personal history of malignant melanoma of skin: Secondary | ICD-10-CM | POA: Diagnosis not present

## 2022-11-02 ENCOUNTER — Encounter (HOSPITAL_BASED_OUTPATIENT_CLINIC_OR_DEPARTMENT_OTHER): Payer: Self-pay | Admitting: Physical Therapy

## 2022-11-02 ENCOUNTER — Encounter (HOSPITAL_BASED_OUTPATIENT_CLINIC_OR_DEPARTMENT_OTHER): Payer: Self-pay

## 2022-11-02 ENCOUNTER — Ambulatory Visit (HOSPITAL_BASED_OUTPATIENT_CLINIC_OR_DEPARTMENT_OTHER): Payer: Medicare Other | Admitting: Physical Therapy

## 2022-11-02 DIAGNOSIS — G20A1 Parkinson's disease without dyskinesia, without mention of fluctuations: Secondary | ICD-10-CM

## 2022-11-02 DIAGNOSIS — R2689 Other abnormalities of gait and mobility: Secondary | ICD-10-CM

## 2022-11-02 NOTE — Therapy (Signed)
[facility]  [place], [place], [address] Phone: [phone]   Fax:  [phone]  Patient Details  Name: Morgan [name] MRN: [id number] Date of Birth: 1951-02-27 Referring Provider:  [name], MD  Encounter Date: 11/02/2022  Pt and her husband arrived for physical therapy treatment (pt sitting in [facility]).  Pt's husband reported that she had seen the neurologist and there were some medication changes.   Pt reports that her head is bothering her due to the rain/ fibromyalgia.  (Of note, pt had a recent fall where she striked her head and was seen in ED).   Discussed with pt and her husband holding therapy today, as pt doesn't appear as alert as she normally is.  Pt and husband agreeable.   Will resume therapy at next scheduled visit on Friday 11/04/22, if appropriate.     Kerin Perna, PTA 11/02/22 1:56 PM

## 2022-11-03 ENCOUNTER — Telehealth: Payer: Self-pay

## 2022-11-03 DIAGNOSIS — R569 Unspecified convulsions: Secondary | ICD-10-CM | POA: Diagnosis not present

## 2022-11-03 DIAGNOSIS — F0280 Dementia in other diseases classified elsewhere without behavioral disturbance: Secondary | ICD-10-CM | POA: Diagnosis not present

## 2022-11-03 DIAGNOSIS — Z609 Problem related to social environment, unspecified: Secondary | ICD-10-CM | POA: Diagnosis not present

## 2022-11-03 DIAGNOSIS — G20A1 Parkinson's disease without dyskinesia, without mention of fluctuations: Secondary | ICD-10-CM | POA: Diagnosis not present

## 2022-11-03 DIAGNOSIS — Z1231 Encounter for screening mammogram for malignant neoplasm of breast: Secondary | ICD-10-CM | POA: Diagnosis not present

## 2022-11-03 DIAGNOSIS — R4182 Altered mental status, unspecified: Secondary | ICD-10-CM | POA: Diagnosis not present

## 2022-11-03 DIAGNOSIS — I447 Left bundle-branch block, unspecified: Secondary | ICD-10-CM | POA: Diagnosis not present

## 2022-11-03 NOTE — Telephone Encounter (Signed)
        Patient  visited [facility] on 10/30/2022  for fall.   Telephone encounter attempt :  1st  A HIPAA compliant voice message was left requesting a return call.  Instructed patient to call back at [phone].   [facility] Resource Care Guide   [email or web address]  ?? [email or web address]   Website: [email or web address]  [email or web address]

## 2022-11-04 ENCOUNTER — Encounter (HOSPITAL_COMMUNITY): Payer: Self-pay

## 2022-11-04 ENCOUNTER — Encounter (HOSPITAL_BASED_OUTPATIENT_CLINIC_OR_DEPARTMENT_OTHER): Payer: Self-pay | Admitting: Physical Therapy

## 2022-11-04 ENCOUNTER — Ambulatory Visit (HOSPITAL_BASED_OUTPATIENT_CLINIC_OR_DEPARTMENT_OTHER): Payer: Medicare Other | Attending: Family Medicine | Admitting: Physical Therapy

## 2022-11-04 DIAGNOSIS — I447 Left bundle-branch block, unspecified: Secondary | ICD-10-CM | POA: Diagnosis not present

## 2022-11-04 DIAGNOSIS — G20A1 Parkinson's disease without dyskinesia, without mention of fluctuations: Secondary | ICD-10-CM | POA: Diagnosis not present

## 2022-11-04 DIAGNOSIS — R2689 Other abnormalities of gait and mobility: Secondary | ICD-10-CM | POA: Diagnosis not present

## 2022-11-04 NOTE — Therapy (Signed)
OUTPATIENT PHYSICAL THERAPY LOWER EXTREMITY TREATMENT   Patient Name: Morgan [name] MRN: [id number] DOB:08-31-1950, 72 y.o., female Today's Date: 11/04/2022   PT End of Session - 11/04/22 0902     Visit Number 25    Number of Visits 41    Date for PT Re-Evaluation 12/06/22    PT Start Time 0858    PT Stop Time 0936    PT Time Calculation (min) 38 min    Behavior During Therapy [facility] for tasks assessed/performed                    Past Medical History:  Diagnosis Date   Anxiety    Fibromyalgia    Headache    Hypercholesteremia    controlled on medication   Hyperlipidemia    Hypertension    no current meds   LBBB (left bundle branch block)    Melanoma (HCC)    MVP (mitral valve prolapse)    Osteopenia    Parkinson disease    Pneumonia    SVD (spontaneous vaginal delivery)    x 2   Past Surgical History:  Procedure Laterality Date   BLEPHAROPLASTY     cold knife conization     COLONOSCOPY     FH colon CA   CYSTOSCOPY W/ URETERAL STENT PLACEMENT Left 10/18/2021   Procedure: CYSTOSCOPY WITH RETROGRADE PYELOGRAM, URETERAL STENT PLACEMENT;  Surgeon: Festus Aloe, MD;  Location: [facility];  Service: Urology;  Laterality: Left;   DILATATION & CURETTAGE/HYSTEROSCOPY WITH MYOSURE N/A 02/14/2018   Procedure: ATTEMPTED DILATATION & CURETTAGE/HYSTEROSCOPY WITH MYOSURE;  Surgeon: Christophe Louis, MD;  Location: [facility];  Service: Gynecology;  Laterality: N/A;  polyp   melanoma cancer     removed from left leg   RECONSTRUCTION OF EYELID     TONSILLECTOMY     TUBAL LIGATION     URETEROSCOPY WITH HOLMIUM LASER LITHOTRIPSY Left 12/17/2021   Procedure: URETEROSCOPY/HOLMIUM LASER/STENT EXCHANGE;  Surgeon: Festus Aloe, MD;  Location: [facility];  Service: Urology;  Laterality: Left;   WISDOM TOOTH EXTRACTION     Patient Active Problem List   Diagnosis Date Noted   COVID-19 virus infection 05/16/2022   HTN (hypertension) 10/20/2021   Physical deconditioning 10/20/2021    Hydronephrosis with infection 10/18/2021   Bacteremia due to Escherichia coli 10/18/2021   Severe sepsis ([facility]) 10/17/2021   UTI (urinary tract infection) A999333   Acute metabolic encephalopathy A999333   Parkinson disease 10/17/2021   Cervical stenosis (uterine cervix) 02/14/2018   Postmenopausal bleeding 02/14/2018   LBBB (left bundle branch block) 12/25/2015   PVCs (premature ventricular contractions) 12/25/2015   Hyperlipidemia 12/25/2015   Fibromyalgia 01/16/2014   Cholelithiasis 04/25/2013   Paralysis agitans 01/15/2013   Unspecified hereditary and idiopathic peripheral neuropathy 01/15/2013   [name] MD PCP:   REFERRING PROVIDER: Hulan Saas MD   REFERRING DIAG: Parkinsons   THERAPY DIAG:  Other abnormalities of gait and mobility  Parkinson's disease without dyskinesia or fluctuating manifestations  Rationale for Evaluation and Treatment Rehabilitation  ONSET DATE: Several years   SUBJECTIVE:   SUBJECTIVE STATEMENT: The pt and husband report that Maat went to ED yesterday for second opinion (re: medication for parkinsons).   She reports they returned home at 2am this morning, but that she is ready for session today.    From EVAL: The patient has had Parkinsons for many years. She has had land therapy with varying degrees of success. She has recently had an onset of bilateral leg  pain that comes and goes. The pain is mostly in the front but can go down to the back as well. She is a member of the gym. She has frequent falls.   PERTINENT HISTORY: Fibromyalgia; headaches, LBBB; osteopenia   PAIN:  Are you having pain? no: NPRS scale:0/10 Pain location:  Pain description:  Aggravating factors: just comes and goes  Relieving factors: just comes and goes; sometimes ibuprofin works but she is limited as to how much she can take  PRECAUTIONS: Patient reports that because of her heart she will pass out at times.   WEIGHT BEARING RESTRICTIONS  No  FALLS:  Has patient fallen in last 6 months? Yes. Number of falls 3-4 most recently slipped on something in the closet and fell face first    LIVING ENVIRONMENT: Stairs to the second floor in her house but she dosen't do much  4 steps in the front  2 steps in the garage  OCCUPATION: retired   Office manager: reading; goes to an exercise class   PLOF: Independent with household mobility with device  PATIENT GOALS  To have less pain in her legs and to move better    OBJECTIVE: * findings taken at evaluation, unless otherwise noted.   DIAGNOSTIC FINDINGS:   PATIENT SURVEYS:  FOTO    COGNITION:  Overall cognitive status: Within functional limits for tasks assessed     SENSATION: Denies paresthesias   POSTURE: No Significant postural limitations  PALPATION: No trigger points noted   LOWER EXTREMITY ROM:  Passive ROM Right eval Left eval  Hip flexion    Hip extension    Hip abduction    Hip adduction    Hip internal rotation    Hip external rotation    Knee flexion    Knee extension    Ankle dorsiflexion    Ankle plantarflexion    Ankle inversion    Ankle eversion     (Blank rows = not tested)  WNL  LOWER EXTREMITY MMT:  MMT Right eval Left eval right left Right  2/5 Left  2/5 Right  2/26 Left 2/26  Hip flexion 21.7 29.7 29.6 35.1 29.3 32.1 35.7 33.8  Hip extension          Hip abduction 27.6 18.5 20.7 22.4 24.1 34.2 26.8 22.8  Hip adduction          Hip internal rotation          Hip external rotation          Knee flexion          Knee extension 23.5 20.2 27.4 25.5 32.5 26.7 31.5 30.1  Ankle dorsiflexion          Ankle plantarflexion          Ankle inversion          Ankle eversion           (Blank rows = not tested)  GAIT: Decreased hip flexion; leans to the left; right hip frop. Patient required CGA while walking and min a when she turned for balance.   Functional tests:  Sit to stand: uses's hands and needs several trials to stand from  the table. Poor intial standing balance 2/5 poor psoterior balance upon initial standing   12/11 5x sit to stand: 20 seconds  2/5:  5xSTS:  11 seconds with guarding for posterior balance   2/26 5x sit to stand 5 sec   Narrow base min A  Narrow base eyes closed min  to mod A  Tandem stance Min A both ways.    BALANCE: 08/08/22 Narrow base min A  Narrow base eyes closed min to mod A  Tandem stance Min A both ways.   2/5 Balance   TODAY'S TREATMENT: 11/04/22 At counter intermittent support:  big side steps L/R; retro gait with cues for big steps (leading L or R) Air-ex:  Narrow BOS with horiz head turns   Semi-Tandem stance 30 sec each leg  Lateral step on/ off x 10 each with light UE on counter  At counter with intermittent support:  Hurdle with light UE at counter (2, repeated 12 times), 2nd set 6  Sit to stand x 10, cues for forward weight shift and overhead reach once upright   Wide stance with shoulder height reach crossing midline x 5 each side; overhead reach tapping cabinets, crossing midline High knee marching forward/backward  - 2x   2/26 LF Leg press 3x15 10 lbs  LF hip abduction machine 3x15  LF row machine 3x15 20 lbs   Reviewed sheet with patient and her husband. They were advised we will put it in the file with the other exercises   Performed re-assessment on the patient. Reviewed test and measures and goals including strength testing, FOTO, and ROM   Reviewed results of testing and performed balance testing with the patient  2/22 NuStep L3 x 6 min, LE/UE Cybex lat pull down 3x15, 15# Cybex tricep 0#, 2 x15# Cybex chest press 0#, 2 x5 Cybex row, 30# 2 x 15  2/19 NuStep L3 x 6:00, UE and LE Leg press 15 lbs 3x15  Tricpes press down 15 lbs 3x15  Row machine 3x15 15 lbs   Reviewed setup and safety with machines   2/15 NuStep L3 x 5:45, UE and LE  Air-ex by counter with use of gait belt (CGA/SBA):   -Forward step on/off with cues to slow down  and work on steadying once on cushion; intermittent UE on counter   -lateral step ups  -NBOS with horiz and vertical head turns  Hip bumps and return to upright without use of hands    High kneeling onto air-ex on 4" box - to single foot up to/from standing  LF Leg press, seat at 10, (0#) 2 x 10 (cues to slow speed and get to full knee ext each rep)  2/12 Nu-step L5 5 min  Tricpes press down 3x15 20 lbs  Life fitness row 3x15 30 lbs  Hip abduction machine 3x15   Air-ex:  Step on and off x20  Tandem stance 2x30 sec each leg  Narrow base x20 sec each leg  Forward and backwards rocking x20  Hurdle x20   mod a for assist with all balance exercises and gait belt.  2/8 Pt seen for aquatic therapy today.  Treatment took place in water 3.25-4.5 ft in depth at the [facility]. Temp of water was 91.  Pt entered/exited the pool via stairs with supervision with bilat rail.  * holding white barbell - forward/ backward gait, side stepping - cues for increased step length * holding yellow hand float:  forward/backward step; side lunge and return to neutral with single arm reach * holding wall:  squats 5, heel raises x 5 * seated on bench - STS with CGA/min A x 5, unsteady * return to walking forward/ backward without UE support  * squat to raise arms overhead x 5  Pt requires the buoyancy and hydrostatic pressure of water  for support, and to offload joints by unweighting joint load by at least 50 % in navel deep water and by at least 75-80% in chest to neck deep water.  Viscosity of the water is needed for resistance of strengthening. Water current perturbations provides challenge to standing balance requiring increased core activation.  2/5 Nu-step: 5 min L4 seat 7  #1 Life Fitness 25 lbs 3x15  Knee extension life fitness 15 lbs 3x15  Hip abduction life fitness 55 3x15   Reviewed set up of gym equipment and how to get on ana doff machines   Performed re-assessment on the  patient. Reviewed test and measures and goals including strength testing, FOTO, and ROM   09/05/22:  CGA provided for safe gait between machines NuStep seat 7, UE #9: L2 (90-100SPM)- 5 min total Cybex lat pull down- 10# x 15, x 2 Cybex Row machine: (chest pad at 3) 13.5#  x 10 x 2 Cybex chest press-  0# x 10 - (should break into 2 sets of 5 next time) Cybex tricep ext - 0# x 10 x 2 LF Seated leg press 0#, x 10 x 2, cues for full range LF hip abdct 35# x  20 x2     PATIENT EDUCATION:  Education details: balance exercises at counter appropriate for home Person educated: Patient,  Education method: Explanation, Demonstration, Tactile cues, Verbal cues Education comprehension: verbalized understanding, returned demonstration, verbal cues required, tactile cues required, and needs further education   HOME EXERCISE PROGRAM:  has a program already  ASSESSMENT:  CLINICAL IMPRESSION: Pt arrived to session, walking with trekking pole (ambulating from parking garage) with husband near by.   She appears alert and verbalized readiness to proceed with session.  Completed series of balance / strengthening exercises near and at counter with short seated rest breaks given throughout session.  She was able to complete increased length in session, compared to previous visits and her balance was better than previous visit.  CGA with gait belt and SBA used throughout session for safety.  She reported fatigue, but no increase in pain. Per pt and husband, pt does better with sessions that are scheduled early in the morning.  Discussed that we will create balance HEP of exercises she can complete near counter at home, with a chair nearby for rest breaks.  Pt progressing gradually towards remaining goals.   OBJECTIVE IMPAIRMENTS Abnormal gait, decreased activity tolerance, decreased balance, decreased mobility, difficulty walking, decreased strength, impaired tone, and pain.   ACTIVITY LIMITATIONS carrying,  lifting, bending, standing, squatting, stairs, transfers, bed mobility, dressing, and locomotion level  PARTICIPATION LIMITATIONS: meal prep, cleaning, laundry, driving, shopping, community activity, and yard work  PERSONAL FACTORS Fibromyalgia; headaches, LBBB; osteopenia  are also affecting patient's functional outcome.   REHAB POTENTIAL: Good  CLINICAL DECISION MAKING: Evolving/moderate complexity increasing pain in her quads   EVALUATION COMPLEXITY: Moderate   GOALS: Goals reviewed with patient? Yes  SHORT TERM GOALS: Target date: 07/13/2022 Patient will transfer sit to stand smoothly and independently  Baseline: Goal status: Worked on technique with sit to stand transfer today improving  2.  Patient will increase gross strength by 5lbs  Baseline:  Goal status: Met for most muscle groups 12/11  3.  Patient will be independent with base pool program  Baseline:  Goal status: Progressing well towards goals 12/11  LONG TERM GOALS: Target date: 11/14/2022  Patient will go improve balance to CGA for all balance exercises to improve safety  Baseline:  Goal  status: Improved to contact-guard today with static balance will progress towards more dynamic balance 12/11  2.  Patient will reach FOTO goal to demonstrate improved function  Baseline:  Goal status: Decreased Foto score but after review of answers initial photo was probably not answered correctly 12/11  3.  Patient will have a full balance program  Baseline:  Goal status: Will work more on the balance program as she progressed more to land 12/11   PLAN: PT FREQUENCY: 2x/week  PT DURATION: 8 weeks  PLANNED INTERVENTIONS: Therapeutic exercises, Therapeutic activity, Neuromuscular re-education, Balance training, Gait training, Patient/Family education, Self Care, Joint mobilization, Stair training, Aquatic Therapy, Cryotherapy, Moist heat, Taping, Manual therapy, and Re-evaluation  PLAN FOR NEXT SESSION: continue with  gait and balance training.   Kerin Perna, PTA 11/04/22 12:42 PM [facility] [address] [place], [place], [address] Phone: [phone]   Fax:  [phone]

## 2022-11-08 ENCOUNTER — Encounter: Payer: Self-pay | Admitting: Neurology

## 2022-11-08 ENCOUNTER — Telehealth: Payer: Self-pay

## 2022-11-08 NOTE — Telephone Encounter (Addendum)
They are moving on and wanting another opinion they were not happy with the response with the cognitive decline for Morgan [name]. So they are not concerned with doing an appointment here I did tell him that they called here because they couldn't get her an appointment and he said they are working with the PCP for referrals. Unless someone else in our office would see patient and I said no Dr. Carles Collet is our movement disorder specialist and he said this is about cognitive decline . I said no noone else    I told him we were here to help but he said he didn't want our help

## 2022-11-08 NOTE — Telephone Encounter (Signed)
This message was very confusing.  I reviewed hospital records which stated:  They report that their neurologist changed the medications and now will not see them again.     Records also stated that 2 physicians [facility] to [facility] and they wanted transfer of care.    [name], please call them.  I didn't change her medication last visit.  They were upset b/c we couldn't find the specific manufacturer of generic levodopa from [employer] (they had it at [facility]).  It sounds also like she had stopped her pramipexole and we didn't know that b/c they told us she was still on it(per my last notes) so we told them to continue it and when she restarted it she got confused.   Call pt/family.  If they would like to transfer care, that is okay and sounds like that is set up.  We just need to make sure that she is taken care of as we never have denied her care, which is what they told the ER.

## 2022-11-08 NOTE — Telephone Encounter (Signed)
[facility] is calling in to schedule hospital follow up for patient, no slots available. Wondering if we would work patient in or touch bases with her.

## 2022-11-08 NOTE — Telephone Encounter (Signed)
Found this in the notes from [facility] were patient fell on 11-03-22 On further questioning, patient's husband reports recent medication changes by her neurologist (re-started pramipexole) around the time he noticed change in patient's mental status. Patient's husband reports patient was hallucinating and acting more confused. Is requesting a new neurology referral. Suspect patient's current presentation of AMS is 2/2 Parkinson's disease in setting of recent medication changes. Patient denies any chest pain, SOB, headache, vision changes. No infectious symptoms including dysuria, cough, abdominal pain. No focal neurological deficits on exam. Labs including CBC, CMP, ammonia, UA WNL.   Disposition Discharge: Patient is felt to be medically appropriate for discharge at this time. Patient was informed of all pertinent physical exam, laboratory, and imaging findings. Patients suspected etiology of their symptom presentation was discussed with the patient and all questions were answered. Patient was instructed to follow up with their primary care doctor for re-evaluation. Placed gold card for neurology for follow up. Patient was given strict return precautions.   The following medications were prescribed to patient upon discharge. There are no discharge medications for this patient.

## 2022-11-09 ENCOUNTER — Encounter (HOSPITAL_BASED_OUTPATIENT_CLINIC_OR_DEPARTMENT_OTHER): Payer: Self-pay | Admitting: Physical Therapy

## 2022-11-09 ENCOUNTER — Ambulatory Visit (HOSPITAL_BASED_OUTPATIENT_CLINIC_OR_DEPARTMENT_OTHER): Payer: Medicare Other | Admitting: Physical Therapy

## 2022-11-09 DIAGNOSIS — G20A1 Parkinson's disease without dyskinesia, without mention of fluctuations: Secondary | ICD-10-CM

## 2022-11-09 DIAGNOSIS — R2689 Other abnormalities of gait and mobility: Secondary | ICD-10-CM | POA: Diagnosis not present

## 2022-11-09 NOTE — Therapy (Signed)
OUTPATIENT PHYSICAL THERAPY LOWER EXTREMITY TREATMENT   Patient Name: Morgan [name] MRN: [id number] DOB:[birth year], 72 y.o., female Today's Date: 11/04/2022   PT End of Session - 11/04/22 0902     Visit Number 25    Number of Visits 80    Date for PT Re-Evaluation 12/06/22    PT Start Time 0858    PT Stop Time 0936    PT Time Calculation (min) 38 min    Behavior During Therapy [facility] for tasks assessed/performed                    Past Medical History:  Diagnosis Date   Anxiety    Fibromyalgia    Headache    Hypercholesteremia    controlled on medication   Hyperlipidemia    Hypertension    no current meds   LBBB (left bundle branch block)    Melanoma (HCC)    MVP (mitral valve prolapse)    Osteopenia    Parkinson disease    Pneumonia    SVD (spontaneous vaginal delivery)    x 2   Past Surgical History:  Procedure Laterality Date   BLEPHAROPLASTY     cold knife conization     COLONOSCOPY     FH colon CA   CYSTOSCOPY W/ URETERAL STENT PLACEMENT Left 10/18/2021   Procedure: CYSTOSCOPY WITH RETROGRADE PYELOGRAM, URETERAL STENT PLACEMENT;  Surgeon: Festus Aloe, MD;  Location: [facility];  Service: Urology;  Laterality: Left;   DILATATION & CURETTAGE/HYSTEROSCOPY WITH MYOSURE N/A 02/14/2018   Procedure: ATTEMPTED DILATATION & CURETTAGE/HYSTEROSCOPY WITH MYOSURE;  Surgeon: Christophe Louis, MD;  Location: [facility];  Service: Gynecology;  Laterality: N/A;  polyp   melanoma cancer     removed from left leg   RECONSTRUCTION OF EYELID     TONSILLECTOMY     TUBAL LIGATION     URETEROSCOPY WITH HOLMIUM LASER LITHOTRIPSY Left 12/17/2021   Procedure: URETEROSCOPY/HOLMIUM LASER/STENT EXCHANGE;  Surgeon: Festus Aloe, MD;  Location: [facility];  Service: Urology;  Laterality: Left;   WISDOM TOOTH EXTRACTION     Patient Active Problem List   Diagnosis Date Noted   COVID-19 virus infection 05/16/2022   HTN (hypertension) 10/20/2021   Physical deconditioning 10/20/2021    Hydronephrosis with infection 10/18/2021   Bacteremia due to Escherichia coli 10/18/2021   Severe sepsis ([place]) 10/17/2021   UTI (urinary tract infection) [id number]   Acute metabolic encephalopathy [id number]   Parkinson disease 10/17/2021   Cervical stenosis (uterine cervix) 02/14/2018   Postmenopausal bleeding 02/14/2018   LBBB (left bundle branch block) 12/25/2015   PVCs (premature ventricular contractions) 12/25/2015   Hyperlipidemia 12/25/2015   Fibromyalgia 01/16/2014   Cholelithiasis 04/25/2013   Paralysis agitans 01/15/2013   Unspecified hereditary and idiopathic peripheral neuropathy 01/15/2013   [name] MD PCP:   REFERRING PROVIDER: Hulan Saas MD   REFERRING DIAG: Parkinsons   THERAPY DIAG:  Other abnormalities of gait and mobility  Parkinson's disease without dyskinesia or fluctuating manifestations  Rationale for Evaluation and Treatment Rehabilitation  ONSET DATE: Several years   SUBJECTIVE:   SUBJECTIVE STATEMENT: Per patient and patients husband she is doing better today. They continue to work on getting her medicine figured out. She will have a cataract surgery tomorrow.   From EVAL: The patient has had Parkinsons for many years. She has had land therapy with varying degrees of success. She has recently had an onset of bilateral leg pain that comes and goes. The pain is mostly in the  front but can go down to the back as well. She is a member of the gym. She has frequent falls.   PERTINENT HISTORY: Fibromyalgia; headaches, LBBB; osteopenia   PAIN:  Are you having pain? no: NPRS scale:0/10 Pain location:  Pain description:  Aggravating factors: just comes and goes  Relieving factors: just comes and goes; sometimes ibuprofin works but she is limited as to how much she can take  PRECAUTIONS: Patient reports that because of her heart she will pass out at times.   WEIGHT BEARING RESTRICTIONS No  FALLS:  Has patient fallen in last 6 months?  Yes. Number of falls 3-4 most recently slipped on something in the closet and fell face first    LIVING ENVIRONMENT: Stairs to the second floor in her house but she dosen't do much  4 steps in the front  2 steps in the garage  OCCUPATION: retired   Office manager: reading; goes to an exercise class   PLOF: Independent with household mobility with device  PATIENT GOALS  To have less pain in her legs and to move better    OBJECTIVE: * findings taken at evaluation, unless otherwise noted.   DIAGNOSTIC FINDINGS:   PATIENT SURVEYS:  FOTO    COGNITION:  Overall cognitive status: Within functional limits for tasks assessed     SENSATION: Denies paresthesias   POSTURE: No Significant postural limitations  PALPATION: No trigger points noted   LOWER EXTREMITY ROM:  Passive ROM Right eval Left eval  Hip flexion    Hip extension    Hip abduction    Hip adduction    Hip internal rotation    Hip external rotation    Knee flexion    Knee extension    Ankle dorsiflexion    Ankle plantarflexion    Ankle inversion    Ankle eversion     (Blank rows = not tested)  WNL  LOWER EXTREMITY MMT:  MMT Right eval Left eval right left Right  2/5 Left  2/5 Right  2/26 Left 2/26  Hip flexion 21.7 29.7 29.6 35.1 29.3 32.1 35.7 33.8  Hip extension          Hip abduction 27.6 18.5 20.7 22.4 24.1 34.2 26.8 22.8  Hip adduction          Hip internal rotation          Hip external rotation          Knee flexion          Knee extension 23.5 20.2 27.4 25.5 32.5 26.7 31.5 30.1  Ankle dorsiflexion          Ankle plantarflexion          Ankle inversion          Ankle eversion           (Blank rows = not tested)  GAIT: Decreased hip flexion; leans to the left; right hip frop. Patient required CGA while walking and min a when she turned for balance.   Functional tests:  Sit to stand: uses's hands and needs several trials to stand from the table. Poor intial standing balance 2/5 poor  psoterior balance upon initial standing   12/11 5x sit to stand: 20 seconds  2/5:  5xSTS:  11 seconds with guarding for posterior balance   2/26 5x sit to stand 5 sec   Narrow base min A  Narrow base eyes closed min to mod A  Tandem stance Min A both ways.  BALANCE: 08/08/22 Narrow base min A  Narrow base eyes closed min to mod A  Tandem stance Min A both ways.   2/5 Balance   TODAY'S TREATMENT: 3/13 Air-ex:  Narrow BOS with horiz head turns   Semi-Tandem stance 30 sec each leg  Lateral step on/ off x 20 each with light UE on counter  Rhythmic stepping fwd and side to  side  x20   Hurdle with light UE at counter (2, repeated 12 times), 2nd set 6  Sit to stand 2x 10, cues for forward weight shift and overhead reach once upright   Tandem  stance with shoulder height reach crossing midline x 5 each side to cone ; overhead reach tapping cabinets, crossing midline High knee marching forward/backward 4 laps    11/04/22 At counter intermittent support:  big side steps L/R; retro gait with cues for big steps (leading L or R) Air-ex:  Narrow BOS with horiz head turns   Semi-Tandem stance 30 sec each leg  Lateral step on/ off x 10 each with light UE on counter  At counter with intermittent support:  Hurdle with light UE at counter (2, repeated 12 times), 2nd set 6  Sit to stand x 10, cues for forward weight shift and overhead reach once upright   Wide stance with shoulder height reach crossing midline x 5 each side; overhead reach tapping cabinets, crossing midline High knee marching forward/backward  - 2x   2/26 LF Leg press 3x15 10 lbs  LF hip abduction machine 3x15  LF row machine 3x15 20 lbs   Reviewed sheet with patient and her husband. They were advised we will put it in the file with the other exercises   Performed re-assessment on the patient. Reviewed test and measures and goals including strength testing, FOTO, and ROM   Reviewed results of testing and  performed balance testing with the patient  2/22 NuStep L3 x 6 min, LE/UE Cybex lat pull down 3x15, 15# Cybex tricep 0#, 2 x15# Cybex chest press 0#, 2 x5 Cybex row, 30# 2 x 15  2/19 NuStep L3 x 6:00, UE and LE Leg press 15 lbs 3x15  Tricpes press down 15 lbs 3x15  Row machine 3x15 15 lbs   Reviewed setup and safety with machines   2/15 NuStep L3 x 5:45, UE and LE  Air-ex by counter with use of gait belt (CGA/SBA):   -Forward step on/off with cues to slow down and work on steadying once on cushion; intermittent UE on counter   -lateral step ups  -NBOS with horiz and vertical head turns  Hip bumps and return to upright without use of hands    High kneeling onto air-ex on 4" box - to single foot up to/from standing  LF Leg press, seat at 10, (0#) 2 x 10 (cues to slow speed and get to full knee ext each rep)  2/12 Nu-step L5 5 min  Tricpes press down 3x15 20 lbs  Life fitness row 3x15 30 lbs  Hip abduction machine 3x15   Air-ex:  Step on and off x20  Tandem stance 2x30 sec each leg  Narrow base x20 sec each leg  Forward and backwards rocking x20  Hurdle x20   mod a for assist with all balance exercises and gait belt.  2/8 Pt seen for aquatic therapy today.  Treatment took place in water 3.25-4.5 ft in depth at the [facility]. Temp of water was 91.  Pt entered/exited the pool  via stairs with supervision with bilat rail.  * holding white barbell - forward/ backward gait, side stepping - cues for increased step length * holding yellow hand float:  forward/backward step; side lunge and return to neutral with single arm reach * holding wall:  squats 5, heel raises x 5 * seated on bench - STS with CGA/min A x 5, unsteady * return to walking forward/ backward without UE support  * squat to raise arms overhead x 5  Pt requires the buoyancy and hydrostatic pressure of water for support, and to offload joints by unweighting joint load by at least 50 % in  navel deep water and by at least 75-80% in chest to neck deep water.  Viscosity of the water is needed for resistance of strengthening. Water current perturbations provides challenge to standing balance requiring increased core activation.  2/5 Nu-step: 5 min L4 seat 7  #1 Life Fitness 25 lbs 3x15  Knee extension life fitness 15 lbs 3x15  Hip abduction life fitness 55 3x15   Reviewed set up of gym equipment and how to get on ana doff machines   Performed re-assessment on the patient. Reviewed test and measures and goals including strength testing, FOTO, and ROM   09/05/22:  CGA provided for safe gait between machines NuStep seat 7, UE #9: L2 (90-100SPM)- 5 min total Cybex lat pull down- 10# x 15, x 2 Cybex Row machine: (chest pad at 3) 13.5#  x 10 x 2 Cybex chest press-  0# x 10 - (should break into 2 sets of 5 next time) Cybex tricep ext - 0# x 10 x 2 LF Seated leg press 0#, x 10 x 2, cues for full range LF hip abdct 35# x  20 x2     PATIENT EDUCATION:  Education details: balance exercises at counter appropriate for home Person educated: Patient,  Education method: Explanation, Demonstration, Tactile cues, Verbal cues Education comprehension: verbalized understanding, returned demonstration, verbal cues required, tactile cues required, and needs further education   HOME EXERCISE PROGRAM:  has a program already  ASSESSMENT:  CLINICAL IMPRESSION: Pt arrived to session, walking with trekking pole (ambulating from parking garage) with husband near by.   She appears alert and verbalized readiness to proceed with session.  Completed series of balance / strengthening exercises near and at counter with short seated rest breaks given throughout session.  She was able to complete increased length in session, compared to previous visits and her balance was better than previous visit.  CGA with gait belt and SBA used throughout session for safety.  She reported fatigue, but no increase in  pain. Per pt and husband, pt does better with sessions that are scheduled early in the morning.  Discussed that we will create balance HEP of exercises she can complete near counter at home, with a chair nearby for rest breaks.  Pt progressing gradually towards remaining goals.   OBJECTIVE IMPAIRMENTS Abnormal gait, decreased activity tolerance, decreased balance, decreased mobility, difficulty walking, decreased strength, impaired tone, and pain.   ACTIVITY LIMITATIONS carrying, lifting, bending, standing, squatting, stairs, transfers, bed mobility, dressing, and locomotion level  PARTICIPATION LIMITATIONS: meal prep, cleaning, laundry, driving, shopping, community activity, and yard work  PERSONAL FACTORS Fibromyalgia; headaches, LBBB; osteopenia  are also affecting patient's functional outcome.   REHAB POTENTIAL: Good  CLINICAL DECISION MAKING: Evolving/moderate complexity increasing pain in her quads   EVALUATION COMPLEXITY: Moderate   GOALS: Goals reviewed with patient? Yes  SHORT TERM GOALS: Target date:  07/13/2022 Patient will transfer sit to stand smoothly and independently  Baseline: Goal status: Worked on technique with sit to stand transfer today improving  2.  Patient will increase gross strength by 5lbs  Baseline:  Goal status: Met for most muscle groups 12/11  3.  Patient will be independent with base pool program  Baseline:  Goal status: Progressing well towards goals 12/11  LONG TERM GOALS: Target date: 11/14/2022  Patient will go improve balance to CGA for all balance exercises to improve safety  Baseline:  Goal status: Improved to contact-guard today with static balance will progress towards more dynamic balance 12/11  2.  Patient will reach FOTO goal to demonstrate improved function  Baseline:  Goal status: Decreased Foto score but after review of answers initial photo was probably not answered correctly 12/11  3.  Patient will have a full balance program   Baseline:  Goal status: Will work more on the balance program as she progressed more to land 12/11   PLAN: PT FREQUENCY: 2x/week  PT DURATION: 8 weeks  PLANNED INTERVENTIONS: Therapeutic exercises, Therapeutic activity, Neuromuscular re-education, Balance training, Gait training, Patient/Family education, Self Care, Joint mobilization, Stair training, Aquatic Therapy, Cryotherapy, Moist heat, Taping, Manual therapy, and Re-evaluation  PLAN FOR NEXT SESSION: continue with gait and balance training.   Kerin Perna, PTA 11/04/22 12:42 PM [facility] [address] [place], [place], [address] Phone: [phone]   Fax:  [phone]

## 2022-11-10 ENCOUNTER — Encounter (HOSPITAL_BASED_OUTPATIENT_CLINIC_OR_DEPARTMENT_OTHER): Payer: Self-pay | Admitting: Physical Therapy

## 2022-11-10 DIAGNOSIS — H269 Unspecified cataract: Secondary | ICD-10-CM | POA: Diagnosis not present

## 2022-11-10 DIAGNOSIS — H2512 Age-related nuclear cataract, left eye: Secondary | ICD-10-CM | POA: Diagnosis not present

## 2022-11-11 ENCOUNTER — Encounter (HOSPITAL_BASED_OUTPATIENT_CLINIC_OR_DEPARTMENT_OTHER): Payer: Medicare Other | Admitting: Physical Therapy

## 2022-11-14 ENCOUNTER — Ambulatory Visit: Payer: Medicare Other | Admitting: Cardiovascular Disease

## 2022-11-15 ENCOUNTER — Ambulatory Visit: Payer: Medicare Other | Attending: Cardiovascular Disease | Admitting: Cardiovascular Disease

## 2022-11-15 ENCOUNTER — Encounter: Payer: Self-pay | Admitting: Cardiovascular Disease

## 2022-11-15 ENCOUNTER — Telehealth: Payer: Self-pay | Admitting: Neurology

## 2022-11-15 VITALS — BP 160/92 | HR 94 | Ht 64.0 in | Wt 155.0 lb

## 2022-11-15 DIAGNOSIS — G20B1 Parkinson's disease with dyskinesia, without mention of fluctuations: Secondary | ICD-10-CM | POA: Diagnosis not present

## 2022-11-15 DIAGNOSIS — G903 Multi-system degeneration of the autonomic nervous system: Secondary | ICD-10-CM | POA: Diagnosis not present

## 2022-11-15 DIAGNOSIS — I447 Left bundle-branch block, unspecified: Secondary | ICD-10-CM | POA: Diagnosis not present

## 2022-11-15 NOTE — Telephone Encounter (Signed)
Patient dismissed from [facility] by Dr. Wells Guiles [name]  effective 11/08/22. Dismissal letter sent out by 1st class [place] mail 11/15/22 [facility].

## 2022-11-15 NOTE — Progress Notes (Signed)
Patient ID: Morgan [name], female   DOB: 1951/06/17, 72 y.o.   MRN: [id number]    Cardiology Office Note    Date:  11/20/2022   ID:  Morgan, [name] 17-May-1951, MRN [id number]  PCP:  [name], MD  Cardiologist:   Sanda Klein, MD   Chief Complaint  Patient presents with   Dizziness    History of Present Illness:  Morgan [name] is a 72 y.o. female with left bundle branch block and Parkinson's disease.  Her husband Morgan [name] is my patient and her mother-in-law Morgan [name] was also my patient.  She was in the emergency room a couple of weeks ago with severely elevated blood pressure, confusion, "hallucinations".  Apparently this was related to change in her medications for Parkinson's disease.  Labs and imaging studies were unremarkable.  Her CT did describe "age advanced brain volume loss", but no evidence of stroke or other acute abnormalities.  Incidental note on her chest x-ray with scattered calcifications of the aortic arch and proximal great vessels.  Her ECG showed sinus rhythm with left bundle branch block.  The QTc was prolonged at 533 ms, which did not correct completely for her QRS duration 132 ms.  She has been having problems with her blood pressure and dizziness.  Her symptoms are strongly suggestive of neurogenic orthostatic hypotension.  The problem was worse when she was taking losartan.  She is on carbidopa levodopa for Parkinson's disease (she has had Parkinson syndrome for over 10 years).  She has not had syncope or any actual falls but has come close a couple of times.  She has a prescription for midodrine but is not taking it since her blood pressure is generally elevated.  She is in the process of finding a new neurologist.  She has clear problems with dyskinesia.  The patient specifically denies any chest pain at rest exertion, dyspnea at rest or with exertion, orthopnea, paroxysmal nocturnal dyspnea, syncope, palpitations, focal neurological deficits,  intermittent claudication, lower extremity edema, unexplained weight gain, cough, hemoptysis or wheezing.   In the office today sitting blood pressure was 151/93, immediately upon standing 131/82, 3 minutes later 136/81 mmHg.  She does not have a history of structural heart disease but does have mild hypertension and hyperlipidemia both adequately treated with pharmacological means. Her most serious medical problem is Parkinson's disease which limits her mobility.    Past Medical History:  Diagnosis Date   Anxiety    Fibromyalgia    Headache    Hypercholesteremia    controlled on medication   Hyperlipidemia    Hypertension    no current meds   LBBB (left bundle branch block)    Melanoma (HCC)    MVP (mitral valve prolapse)    Osteopenia    Parkinson disease    Pneumonia    SVD (spontaneous vaginal delivery)    x 2    Past Surgical History:  Procedure Laterality Date   BLEPHAROPLASTY     cold knife conization     COLONOSCOPY     FH colon CA   CYSTOSCOPY W/ URETERAL STENT PLACEMENT Left 10/18/2021   Procedure: CYSTOSCOPY WITH RETROGRADE PYELOGRAM, URETERAL STENT PLACEMENT;  Surgeon: Festus Aloe, MD;  Location: [facility];  Service: Urology;  Laterality: Left;   DILATATION & CURETTAGE/HYSTEROSCOPY WITH MYOSURE N/A 02/14/2018   Procedure: ATTEMPTED DILATATION & CURETTAGE/HYSTEROSCOPY WITH MYOSURE;  Surgeon: Christophe Louis, MD;  Location: [facility];  Service: Gynecology;  Laterality: N/A;  polyp   melanoma  cancer     removed from left leg   RECONSTRUCTION OF EYELID     TONSILLECTOMY     TUBAL LIGATION     URETEROSCOPY WITH HOLMIUM LASER LITHOTRIPSY Left 12/17/2021   Procedure: URETEROSCOPY/HOLMIUM LASER/STENT EXCHANGE;  Surgeon: Festus Aloe, MD;  Location: [facility];  Service: Urology;  Laterality: Left;   WISDOM TOOTH EXTRACTION      Current Medications: Outpatient Medications Prior to Visit  Medication Sig Dispense Refill   ALPRAZolam (XANAX) 0.25 MG tablet Take 0.25 mg by  mouth daily as needed for anxiety.      aspirin EC 81 MG tablet Take 81 mg by mouth daily. Swallow whole.     atorvastatin (LIPITOR) 10 MG tablet Take 10 mg by mouth in the morning.     BLINK TEARS 0.25 % SOLN Place 1 drop into both eyes 3 (three) times daily as needed (for dryness).     carbidopa-levodopa (SINEMET IR) 25-100 MG tablet 1 tablet every 2 hours starting at 6-7am (getting about 6-7 in per day)The medication have a score line so it can be broken. 540 tablet 0   Cholecalciferol (VITAMIN D3) 25 MCG (1000 UT) CHEW Chew 2,500 Units by mouth daily.     Coenzyme Q10-Vitamin E (QUNOL ULTRA COQ10 PO) Take 1 capsule by mouth daily.     CYMBALTA 60 MG capsule TAKE 1 CAPSULE EVERY       MORNING 90 capsule 0   gabapentin (NEURONTIN) 400 MG capsule Take 1 capsule (400 mg total) by mouth 3 (three) times daily as needed (for leg pain or soreness).     Ibuprofen 200 MG CAPS Take 2 capsules (400 mg total) by mouth every 8 (eight) hours as needed (Leg pain, headache or mild pain).     Levodopa (INBRIJA) 42 MG CAPS You can inhale the capsules as needed up to 5 times per day, separated by 2 hour intervals. 60 capsule 1   methocarbamol (ROBAXIN) 750 MG tablet Take 750 mg by mouth every 6 (six) hours as needed for muscle spasms (or leg pain).     midodrine (PROAMATINE) 2.5 MG tablet Take 1 tablet (2.5 mg total) by mouth 3 (three) times daily as needed (for systolic blood pressure less than 100). 90 tablet 11   Multiple Vitamins-Minerals (PRESERVISION AREDS 2 PO) Take 1 capsule by mouth 2 (two) times daily.     Pramipexole Dihydrochloride 0.75 MG TB24 Take 1 tablet (0.75 mg total) by mouth daily. 90 tablet 0   raloxifene (EVISTA) 60 MG tablet Take 60 mg by mouth in the morning.     ramelteon (ROZEREM) 8 MG tablet TAKE 1 TABLET AT BEDTIME 90 tablet 0   TART CHERRY PO Take 3,000 mg by mouth daily.     No facility-administered medications prior to visit.     Allergies:   Other, Phenergan [promethazine hcl],  Acetaminophen, Itraconazole, and Tamsulosin   Social History   Socioeconomic History   Marital status: Married    Spouse name: Not on file   Number of children: 2   Years of education: Not on file   Highest education level: Bachelor's degree (e.g., BA, AB, BS)  Occupational History   Occupation: retired    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  Tobacco Use   Smoking status: Former    Packs/day: .25    Types: Cigarettes    Quit date: 09/29/1997    Years since quitting: 25.1   Smokeless tobacco: Never   Tobacco comments:    chews [place],  10-12/day  Vaping Use   Vaping Use: Never used  Substance and Sexual Activity   Alcohol use: Yes    Comment: Occasional wine   Drug use: No   Sexual activity: Not on file  Other Topics Concern   Not on file  Social History Narrative   right handed   2 story home    Lives with spouse   Social Determinants of Health   Financial Resource Strain: Not on file  Food Insecurity: No Food Insecurity (05/17/2022)   Hunger Vital Sign    Worried About Running Out of Food in the Last Year: Never true    Ran Out of Food in the Last Year: Never true  Transportation Needs: No Transportation Needs (05/31/2022)   PRAPARE - Hydrologist (Medical): No    Lack of Transportation (Non-Medical): No  Physical Activity: Not on file  Stress: Not on file  Social Connections: Not on file     Family History:  The patient's family history includes Cancer in her mother; Healthy in her brother, child, and sister.   ROS:   Please see the history of present illness.    ROS All other systems are reviewed and are negative.   PHYSICAL EXAM:   VS:  BP (!) 160/92   Pulse 94   Ht 5\' 4"  (1.626 m)   Wt 155 lb (70.3 kg)   SpO2 94%   BMI 26.61 kg/m      General: Alert, oriented x3, no distress, rather quiet Head: no evidence of trauma, PERRL, EOMI, no exophtalmos or lid lag, no myxedema, no xanthelasma; normal ears, nose and  oropharynx Neck: normal jugular venous pulsations and no hepatojugular reflux; brisk carotid pulses without delay and no carotid bruits Chest: clear to auscultation, no signs of consolidation by percussion or palpation, normal fremitus, symmetrical and full respiratory excursions Cardiovascular: normal position and quality of the apical impulse, regular rhythm, normal first and toxically split second heart sounds, no murmurs, rubs or gallops Abdomen: no tenderness or distention, no masses by palpation, no abnormal pulsatility or arterial bruits, normal bowel sounds, no hepatosplenomegaly Extremities: no clubbing, cyanosis or edema; 2+ radial, ulnar and brachial pulses bilaterally; 2+ right femoral, posterior tibial and dorsalis pedis pulses; 2+ left femoral, posterior tibial and dorsalis pedis pulses; no subclavian or femoral bruits Neurological: Prominent choreiform dyskinesia involving her trunk, neck and limbs.  Difficulty standing up from chair.  Otherwise nonfocal. Psych: Normal mood and affect    Wt Readings from Last 3 Encounters:  11/15/22 155 lb (70.3 kg)  10/30/22 150 lb (68 kg)  10/18/22 138 lb (62.6 kg)      Studies/Labs Reviewed:   EKG:  EKG is not ordered today.  The tracing from 11/04/2022 shows sinus rhythm at 76 bpm, with a narrow QRS complex, a single PVC.  QTc was completely normal at 430 ms.  An older ECG from 05/18/2022 shows sinus rhythm at a faster rate of 90 bpm, left bundle branch block with a QRS duration of 138 ms and a QTc of 537 ms.  Recent Labs: 05/17/2022: Magnesium 2.0 05/18/2022: ALT 10; BUN 11; Creatinine, Ser 0.64; Hemoglobin 11.5; Platelets 197; Potassium 3.4; Sodium 141  10/04/2018 hemoglobin 12.9, creatinine 0.4, potassium 3.8, normal liver function tests, TSH 1.93  Lipid Panel    Component Value Date/Time   CHOL  09/04/2008 0105    155        ATP III CLASSIFICATION:  <200  mg/dL   Desirable  200-239  mg/dL   Borderline High  >=240    mg/dL    High          TRIG 175 (H) 09/04/2008 0105   HDL 40 09/04/2008 0105   CHOLHDL 3.9 09/04/2008 0105   VLDL 35 09/04/2008 0105   LDLCALC  09/04/2008 0105    80        Total Cholesterol/HDL:CHD Risk Coronary Heart Disease Risk Table                     Men   Women  1/2 Average Risk   3.4   3.3  Average Risk       5.0   4.4  2 X Average Risk   9.6   7.1  3 X Average Risk  23.4   11.0        Use the calculated Patient Ratio above and the CHD Risk Table to determine the patient's CHD Risk.        ATP III CLASSIFICATION (LDL):  <100     mg/dL   Optimal  100-129  mg/dL   Near or Above                    Optimal  130-159  mg/dL   Borderline  160-189  mg/dL   High  >190     mg/dL   Very High  10/04/2018 total cholesterol 190, HDL 42, LDL 101, triglycerides 228  Additional studies/ records that were reviewed today include:  Records from PCP   ASSESSMENT:    1. LBBB (left bundle branch block)   2. Neurogenic orthostatic hypotension (HCC)      PLAN:  In order of problems listed above:  LBBB: This appears to be a rate related abnormality (not seen at heart rate of 76 bpm, present at heart rate of 90 bpm).  Previous work-up shows no evidence of structural heart disease.  She has not had problems with bradycardia or syncope or edema to suggest that she has episodes of high-grade AV block. Orthostatic hypotension: Relatively mild by assessment today with a roughly 20 point drop in her blood pressure upon standing, but with typical symptoms at home.  We discussed that this is a common complication of Parkinson's disease and can be difficult to treat.  Since she has resting hypertension, at this point she does not need vasoconstrictors such as midodrine.  Advised very gradual change in position, compression stockings when upright.  Avoid dehydration.  Should have midodrine available "as needed", but should not take this on a daily basis.  Discussed that the timing of the midodrine is important  (is only necessary when she will be upright) and the fact that she should not be supine in the 4 hours after taking the medication.  Discussed other options that may be used in the future such as volume expansion fludrocortisone/high salt diet, abdominal binder, droxidopa, etc. for the time being would simply recommend avoiding antihypertensive medications, tolerating systolic blood pressure in the 150-160 range to avoid symptomatic orthostatic hypotension. Parkinson's disease: No longer seeing [facility] and is seeking a new neurologist out of town.  She has prominent dyskinesia.  Does not appear to have "on/off" problems.    Medication Adjustments/Labs and Tests Ordered: Current medicines are reviewed at length with the patient today.  Concerns regarding medicines are outlined above.  Medication changes, Labs and Tests ordered today are listed in the Patient Instructions below. Patient  Instructions  Medication Instructions:  No changes *If you need a refill on your cardiac medications before your next appointment, please call your pharmacy*  Follow-Up: At [facility], you and your health needs are our priority.  As part of our continuing mission to provide you with exceptional heart care, we have created designated Provider Care Teams.  These Care Teams include your primary Cardiologist (physician) and Advanced Practice Providers (APPs -  Physician Assistants and Nurse Practitioners) who all work together to provide you with the care you need, when you need it.  We recommend signing up for the patient portal called "MyChart".  Sign up information is provided on this After Visit Summary.  MyChart is used to connect with patients for Virtual Visits (Telemedicine).  Patients are able to view lab/test results, encounter notes, upcoming appointments, etc.  Non-urgent messages can be sent to your provider as well.   To learn more about what you can do with MyChart, go to  [email or web address].    Your next appointment:   1 year(s)  Provider:   Sanda Klein, MD       Signed, Sanda Klein, MD  11/20/2022 3:24 PM    [facility] [place], [place], [place]  [address] Phone: [phone]; Fax: [phone]

## 2022-11-15 NOTE — Patient Instructions (Signed)
Medication Instructions:  No changes *If you need a refill on your cardiac medications before your next appointment, please call your pharmacy*  Follow-Up: At [facility], you and your health needs are our priority.  As part of our continuing mission to provide you with exceptional heart care, we have created designated Provider Care Teams.  These Care Teams include your primary Cardiologist (physician) and Advanced Practice Providers (APPs -  Physician Assistants and Nurse Practitioners) who all work together to provide you with the care you need, when you need it.  We recommend signing up for the patient portal called "MyChart".  Sign up information is provided on this After Visit Summary.  MyChart is used to connect with patients for Virtual Visits (Telemedicine).  Patients are able to view lab/test results, encounter notes, upcoming appointments, etc.  Non-urgent messages can be sent to your provider as well.   To learn more about what you can do with MyChart, go to [email or web address].    Your next appointment:   1 year(s)  Provider:   Mihai Croitoru, MD      

## 2022-11-20 ENCOUNTER — Other Ambulatory Visit: Payer: Self-pay | Admitting: Neurology

## 2022-11-20 ENCOUNTER — Encounter: Payer: Self-pay | Admitting: Cardiovascular Disease

## 2022-11-20 DIAGNOSIS — G20A1 Parkinson's disease without dyskinesia, without mention of fluctuations: Secondary | ICD-10-CM

## 2022-11-20 DIAGNOSIS — G20B1 Parkinson's disease with dyskinesia, without mention of fluctuations: Secondary | ICD-10-CM

## 2022-11-22 ENCOUNTER — Other Ambulatory Visit: Payer: Self-pay

## 2022-11-22 DIAGNOSIS — G20A1 Parkinson's disease without dyskinesia, without mention of fluctuations: Secondary | ICD-10-CM

## 2022-11-22 DIAGNOSIS — G20B1 Parkinson's disease with dyskinesia, without mention of fluctuations: Secondary | ICD-10-CM

## 2022-11-22 MED ORDER — CARBIDOPA-LEVODOPA 25-100 MG PO TABS: ORAL_TABLET | ORAL | 0 refills | Status: AC

## 2022-11-23 ENCOUNTER — Encounter (HOSPITAL_BASED_OUTPATIENT_CLINIC_OR_DEPARTMENT_OTHER): Payer: Self-pay | Admitting: Physical Therapy

## 2022-11-23 ENCOUNTER — Ambulatory Visit (HOSPITAL_BASED_OUTPATIENT_CLINIC_OR_DEPARTMENT_OTHER): Payer: Medicare Other | Admitting: Physical Therapy

## 2022-11-23 DIAGNOSIS — G20A1 Parkinson's disease without dyskinesia, without mention of fluctuations: Secondary | ICD-10-CM

## 2022-11-23 DIAGNOSIS — R2689 Other abnormalities of gait and mobility: Secondary | ICD-10-CM

## 2022-11-23 NOTE — Therapy (Signed)
OUTPATIENT PHYSICAL THERAPY LOWER EXTREMITY TREATMENT   Patient Name: Morgan [name] MRN: [id number] DOB:July 08, 1951, 72 y.o., female Today's Date: 11/10/2022   PT End of Session - 11/09/22 1106     Visit Number 26    Number of Visits 36    Date for PT Re-Evaluation 12/06/22    PT Start Time 1100    PT Stop Time 1143    PT Time Calculation (min) 43 min    Activity Tolerance Patient limited by fatigue    Behavior During Therapy WFL for tasks assessed/performed                    Past Medical History:  Diagnosis Date   Anxiety    Fibromyalgia    Headache    Hypercholesteremia    controlled on medication   Hyperlipidemia    Hypertension    no current meds   LBBB (left bundle branch block)    Melanoma (HCC)    MVP (mitral valve prolapse)    Osteopenia    Parkinson disease    Pneumonia    SVD (spontaneous vaginal delivery)    x 2   Past Surgical History:  Procedure Laterality Date   BLEPHAROPLASTY     cold knife conization     COLONOSCOPY     FH colon CA   CYSTOSCOPY W/ URETERAL STENT PLACEMENT Left 10/18/2021   Procedure: CYSTOSCOPY WITH RETROGRADE PYELOGRAM, URETERAL STENT PLACEMENT;  Surgeon: Festus Aloe, MD;  Location: [facility];  Service: Urology;  Laterality: Left;   DILATATION & CURETTAGE/HYSTEROSCOPY WITH MYOSURE N/A 02/14/2018   Procedure: ATTEMPTED DILATATION & CURETTAGE/HYSTEROSCOPY WITH MYOSURE;  Surgeon: Christophe Louis, MD;  Location: [facility];  Service: Gynecology;  Laterality: N/A;  polyp   melanoma cancer     removed from left leg   RECONSTRUCTION OF EYELID     TONSILLECTOMY     TUBAL LIGATION     URETEROSCOPY WITH HOLMIUM LASER LITHOTRIPSY Left 12/17/2021   Procedure: URETEROSCOPY/HOLMIUM LASER/STENT EXCHANGE;  Surgeon: Festus Aloe, MD;  Location: [facility];  Service: Urology;  Laterality: Left;   WISDOM TOOTH EXTRACTION     Patient Active Problem List   Diagnosis Date Noted   COVID-19 virus infection 05/16/2022   HTN (hypertension)  10/20/2021   Physical deconditioning 10/20/2021   Hydronephrosis with infection 10/18/2021   Bacteremia due to Escherichia coli 10/18/2021   Severe sepsis ([place]) 10/17/2021   UTI (urinary tract infection) A999333   Acute metabolic encephalopathy A999333   Parkinson disease 10/17/2021   Cervical stenosis (uterine cervix) 02/14/2018   Postmenopausal bleeding 02/14/2018   LBBB (left bundle branch block) 12/25/2015   PVCs (premature ventricular contractions) 12/25/2015   Hyperlipidemia 12/25/2015   Fibromyalgia 01/16/2014   Cholelithiasis 04/25/2013   Paralysis agitans 01/15/2013   Unspecified hereditary and idiopathic peripheral neuropathy 01/15/2013   [name] MD PCP:   REFERRING PROVIDER: Hulan Saas MD   REFERRING DIAG: Parkinsons   THERAPY DIAG:  Other abnormalities of gait and mobility  Parkinson's disease without dyskinesia or fluctuating manifestations  Rationale for Evaluation and Treatment Rehabilitation  ONSET DATE: Several years   SUBJECTIVE:   SUBJECTIVE STATEMENT: Patient is feeling about the same from last visit, not the best. Is experiencing pain on the back of both legs. Had cataract surgery on one side, the other one is schedule in the coming weeks. Per patient and husband she has been cleared for exercises.  From EVAL: The patient has had Parkinsons for many years. She has had  land therapy with varying degrees of success. She has recently had an onset of bilateral leg pain that comes and goes. The pain is mostly in the front but can go down to the back as well. She is a member of the gym. She has frequent falls.   PERTINENT HISTORY: Fibromyalgia; headaches, LBBB; osteopenia   PAIN:  Are you having pain? no: NPRS scale:0/10 Pain location:  Pain description:  Aggravating factors: just comes and goes  Relieving factors: just comes and goes; sometimes ibuprofin works but she is limited as to how much she can take  PRECAUTIONS: Patient reports  that because of her heart she will pass out at times.   WEIGHT BEARING RESTRICTIONS No  FALLS:  Has patient fallen in last 6 months? Yes. Number of falls 3-4 most recently slipped on something in the closet and fell face first    LIVING ENVIRONMENT: Stairs to the second floor in her house but she dosen't do much  4 steps in the front  2 steps in the garage  OCCUPATION: retired   Office manager: reading; goes to an exercise class   PLOF: Independent with household mobility with device  PATIENT GOALS  To have less pain in her legs and to move better    OBJECTIVE: * findings taken at evaluation, unless otherwise noted.   DIAGNOSTIC FINDINGS:   PATIENT SURVEYS:  FOTO    COGNITION:  Overall cognitive status: Within functional limits for tasks assessed     SENSATION: Denies paresthesias   POSTURE: No Significant postural limitations  PALPATION: No trigger points noted   LOWER EXTREMITY ROM:  Passive ROM Right eval Left eval  Hip flexion    Hip extension    Hip abduction    Hip adduction    Hip internal rotation    Hip external rotation    Knee flexion    Knee extension    Ankle dorsiflexion    Ankle plantarflexion    Ankle inversion    Ankle eversion     (Blank rows = not tested)  WNL  LOWER EXTREMITY MMT:  MMT Right eval Left eval right left Right  2/5 Left  2/5 Right  2/26 Left 2/26  Hip flexion 21.7 29.7 29.6 35.1 29.3 32.1 35.7 33.8  Hip extension          Hip abduction 27.6 18.5 20.7 22.4 24.1 34.2 26.8 22.8  Hip adduction          Hip internal rotation          Hip external rotation          Knee flexion          Knee extension 23.5 20.2 27.4 25.5 32.5 26.7 31.5 30.1  Ankle dorsiflexion          Ankle plantarflexion          Ankle inversion          Ankle eversion           (Blank rows = not tested)  GAIT: Decreased hip flexion; leans to the left; right hip frop. Patient required CGA while walking and min a when she turned for balance.    Functional tests:  Sit to stand: uses's hands and needs several trials to stand from the table. Poor intial standing balance 2/5 poor psoterior balance upon initial standing   12/11 5x sit to stand: 20 seconds  2/5:  5xSTS:  11 seconds with guarding for posterior balance   2/26 5x sit  to stand 5 sec   Narrow base min A  Narrow base eyes closed min to mod A  Tandem stance Min A both ways.    BALANCE: 08/08/22 Narrow base min A  Narrow base eyes closed min to mod A  Tandem stance Min A both ways.   2/5 Balance   TODAY'S TREATMENT: 3/27 Nu step x56mins L4 Sit to stand 2x10  Sit to stand with foam pad 1x10 Foam pad posterior sway. 2x10 Foam Pad Narrow BOS reach out to ball light taps. 2x10 Foam pad light taps 2x10   3/13 Air-ex:  Narrow BOS with horiz head turns   Semi-Tandem stance 30 sec each leg  Lateral step on/ off x 20 each with light UE on counter  Rhythmic stepping fwd and side to  side  x20   Hurdle with light UE at counter (2, repeated 12 times), 2nd set 6  Sit to stand 2x 10, cues for forward weight shift and overhead reach once upright   Tandem  stance with shoulder height reach crossing midline x 5 each side to cone ; overhead reach tapping cabinets, crossing midline High knee marching forward/backward 4 laps    11/04/22 At counter intermittent support:  big side steps L/R; retro gait with cues for big steps (leading L or R) Air-ex:  Narrow BOS with horiz head turns   Semi-Tandem stance 30 sec each leg  Lateral step on/ off x 10 each with light UE on counter  At counter with intermittent support:  Hurdle with light UE at counter (2, repeated 12 times), 2nd set 6  Sit to stand x 10, cues for forward weight shift and overhead reach once upright   Wide stance with shoulder height reach crossing midline x 5 each side; overhead reach tapping cabinets, crossing midline High knee marching forward/backward  - 2x   2/26 LF Leg press 3x15 10 lbs  LF hip  abduction machine 3x15  LF row machine 3x15 20 lbs   Reviewed sheet with patient and her husband. They were advised we will put it in the file with the other exercises   Performed re-assessment on the patient. Reviewed test and measures and goals including strength testing, FOTO, and ROM   Reviewed results of testing and performed balance testing with the patient  2/22 NuStep L3 x 6 min, LE/UE Cybex lat pull down 3x15, 15# Cybex tricep 0#, 2 x15# Cybex chest press 0#, 2 x5 Cybex row, 30# 2 x 15  2/19 NuStep L3 x 6:00, UE and LE Leg press 15 lbs 3x15  Tricpes press down 15 lbs 3x15  Row machine 3x15 15 lbs   Reviewed setup and safety with machines   2/15 NuStep L3 x 5:45, UE and LE  Air-ex by counter with use of gait belt (CGA/SBA):   -Forward step on/off with cues to slow down and work on steadying once on cushion; intermittent UE on counter   -lateral step ups  -NBOS with horiz and vertical head turns  Hip bumps and return to upright without use of hands    High kneeling onto air-ex on 4" box - to single foot up to/from standing  LF Leg press, seat at 10, (0#) 2 x 10 (cues to slow speed and get to full knee ext each rep)  2/12 Nu-step L5 5 min  Tricpes press down 3x15 20 lbs  Life fitness row 3x15 30 lbs  Hip abduction machine 3x15   Air-ex:  Step on and off x20  Tandem stance 2x30 sec each leg  Narrow base x20 sec each leg  Forward and backwards rocking x20  Hurdle x20   mod a for assist with all balance exercises and gait belt.   PATIENT EDUCATION:  Education details: balance exercises at counter appropriate for home Person educated: Patient,  Education method: Explanation, Demonstration, Tactile cues, Verbal cues Education comprehension: verbalized understanding, returned demonstration, verbal cues required, tactile cues required, and needs further education   HOME EXERCISE PROGRAM:  has a program already  ASSESSMENT:  CLINICAL IMPRESSION: Patient  reports that today is not one of her best days, was not feeling too good in the entry of session. However, she did a great job maintaining her balance and mobility today. Her husband reports they have been trying different timing with her medication, took one today at [place]. She was able to complete all balance exercises today with moderate cuing and min to max CGA. She had her first cataract surgery last week, she will be having the next one in a few weeks. Towards the end of the session she was feeling a little worse. Overall she tolerated treatment session well, despite not having her best day.  OBJECTIVE IMPAIRMENTS Abnormal gait, decreased activity tolerance, decreased balance, decreased mobility, difficulty walking, decreased strength, impaired tone, and pain.   ACTIVITY LIMITATIONS carrying, lifting, bending, standing, squatting, stairs, transfers, bed mobility, dressing, and locomotion level  PARTICIPATION LIMITATIONS: meal prep, cleaning, laundry, driving, shopping, community activity, and yard work  PERSONAL FACTORS Fibromyalgia; headaches, LBBB; osteopenia  are also affecting patient's functional outcome.   REHAB POTENTIAL: Good  CLINICAL DECISION MAKING: Evolving/moderate complexity increasing pain in her quads   EVALUATION COMPLEXITY: Moderate   GOALS: Goals reviewed with patient? Yes  SHORT TERM GOALS: Target date: 07/13/2022 Patient will transfer sit to stand smoothly and independently  Baseline: Goal status: Worked on technique with sit to stand transfer today improving  2.  Patient will increase gross strength by 5lbs  Baseline:  Goal status: Met for most muscle groups 12/11  3.  Patient will be independent with base pool program  Baseline:  Goal status: Progressing well towards goals 12/11  LONG TERM GOALS: Target date: 11/14/2022  Patient will go improve balance to CGA for all balance exercises to improve safety  Baseline:  Goal status: Improved to contact-guard  today with static balance will progress towards more dynamic balance 12/11  2.  Patient will reach FOTO goal to demonstrate improved function  Baseline:  Goal status: Decreased Foto score but after review of answers initial photo was probably not answered correctly 12/11  3.  Patient will have a full balance program  Baseline:  Goal status: Will work more on the balance program as she progressed more to land 12/11   PLAN: PT FREQUENCY: 2x/week  PT DURATION: 8 weeks  PLANNED INTERVENTIONS: Therapeutic exercises, Therapeutic activity, Neuromuscular re-education, Balance training, Gait training, Patient/Family education, Self Care, Joint mobilization, Stair training, Aquatic Therapy, Cryotherapy, Moist heat, Taping, Manual therapy, and Re-evaluation  PLAN FOR NEXT SESSION: continue with gait and balance training.    Jaci Standard SPT  11/10/2022  Carolyne Littles PT DPT  11/10/22 8:27 AM [place], [place], [address] Phone: [phone]   Fax:  [phone]  During this treatment session, the therapist was present, participating in and directing the treatment.

## 2022-11-30 ENCOUNTER — Encounter (HOSPITAL_BASED_OUTPATIENT_CLINIC_OR_DEPARTMENT_OTHER): Payer: Self-pay | Admitting: Physical Therapy

## 2022-11-30 ENCOUNTER — Ambulatory Visit (HOSPITAL_BASED_OUTPATIENT_CLINIC_OR_DEPARTMENT_OTHER): Payer: Medicare Other | Attending: Family Medicine | Admitting: Physical Therapy

## 2022-11-30 DIAGNOSIS — G20A1 Parkinson's disease without dyskinesia, without mention of fluctuations: Secondary | ICD-10-CM | POA: Insufficient documentation

## 2022-11-30 DIAGNOSIS — R2689 Other abnormalities of gait and mobility: Secondary | ICD-10-CM

## 2022-11-30 NOTE — Therapy (Signed)
OUTPATIENT PHYSICAL THERAPY LOWER EXTREMITY TREATMENT   Patient Name: Morgan [name] MRN: [id number] DOB:19-May-1951, 72 y.o., female Today's Date: 11/30/2022   PT End of Session - 11/30/22 0855     Visit Number 28    Number of Visits 98    Date for PT Re-Evaluation 12/06/22    Authorization Type progress note done on visit 56    PT Start Time 0845    PT Stop Time 0920    PT Time Calculation (min) 35 min    Activity Tolerance Patient tolerated treatment well    Behavior During Therapy WFL for tasks assessed/performed                    Past Medical History:  Diagnosis Date   Anxiety    Fibromyalgia    Headache    Hypercholesteremia    controlled on medication   Hyperlipidemia    Hypertension    no current meds   LBBB (left bundle branch block)    Melanoma    MVP (mitral valve prolapse)    Osteopenia    Parkinson disease    Pneumonia    SVD (spontaneous vaginal delivery)    x 2   Past Surgical History:  Procedure Laterality Date   BLEPHAROPLASTY     cold knife conization     COLONOSCOPY     FH colon CA   CYSTOSCOPY W/ URETERAL STENT PLACEMENT Left 10/18/2021   Procedure: CYSTOSCOPY WITH RETROGRADE PYELOGRAM, URETERAL STENT PLACEMENT;  Surgeon: Festus Aloe, MD;  Location: [facility];  Service: Urology;  Laterality: Left;   DILATATION & CURETTAGE/HYSTEROSCOPY WITH MYOSURE N/A 02/14/2018   Procedure: ATTEMPTED DILATATION & CURETTAGE/HYSTEROSCOPY WITH MYOSURE;  Surgeon: Christophe Louis, MD;  Location: [facility];  Service: Gynecology;  Laterality: N/A;  polyp   melanoma cancer     removed from left leg   RECONSTRUCTION OF EYELID     TONSILLECTOMY     TUBAL LIGATION     URETEROSCOPY WITH HOLMIUM LASER LITHOTRIPSY Left 12/17/2021   Procedure: URETEROSCOPY/HOLMIUM LASER/STENT EXCHANGE;  Surgeon: Festus Aloe, MD;  Location: [facility];  Service: Urology;  Laterality: Left;   WISDOM TOOTH EXTRACTION     Patient Active Problem List   Diagnosis Date Noted    COVID-19 virus infection 05/16/2022   HTN (hypertension) 10/20/2021   Physical deconditioning 10/20/2021   Hydronephrosis with infection 10/18/2021   Bacteremia due to Escherichia coli 10/18/2021   Severe sepsis 10/17/2021   UTI (urinary tract infection) A999333   Acute metabolic encephalopathy A999333   Parkinson disease 10/17/2021   Cervical stenosis (uterine cervix) 02/14/2018   Postmenopausal bleeding 02/14/2018   LBBB (left bundle branch block) 12/25/2015   PVCs (premature ventricular contractions) 12/25/2015   Hyperlipidemia 12/25/2015   Fibromyalgia 01/16/2014   Cholelithiasis 04/25/2013   Paralysis agitans 01/15/2013   Unspecified hereditary and idiopathic peripheral neuropathy 01/15/2013   [name] MD PCP:   REFERRING PROVIDER: Hulan Saas MD   REFERRING DIAG: Parkinsons   THERAPY DIAG:  Other abnormalities of gait and mobility  Parkinson's disease without dyskinesia or fluctuating manifestations  Rationale for Evaluation and Treatment Rehabilitation  ONSET DATE: Several years   SUBJECTIVE:   SUBJECTIVE STATEMENT: Patient is feeling about the same from last visit, not the best. Is experiencing pain on the back of both legs. Had cataract surgery on one side, the other one is schedule in the coming weeks. Per patient and husband she has been cleared for exercises.  From EVAL: The patient  has had Parkinsons for many years. She has had land therapy with varying degrees of success. She has recently had an onset of bilateral leg pain that comes and goes. The pain is mostly in the front but can go down to the back as well. She is a member of the gym. She has frequent falls.   PERTINENT HISTORY: Fibromyalgia; headaches, LBBB; osteopenia   PAIN:  Are you having pain? no: NPRS scale:0/10 Pain location:  Pain description:  Aggravating factors: just comes and goes  Relieving factors: just comes and goes; sometimes ibuprofin works but she is limited as to  how much she can take  PRECAUTIONS: Patient reports that because of her heart she will pass out at times.   WEIGHT BEARING RESTRICTIONS No  FALLS:  Has patient fallen in last 6 months? Yes. Number of falls 3-4 most recently slipped on something in the closet and fell face first    LIVING ENVIRONMENT: Stairs to the second floor in her house but she dosen't do much  4 steps in the front  2 steps in the garage  OCCUPATION: retired   Office manager: reading; goes to an exercise class   PLOF: Independent with household mobility with device  PATIENT GOALS  To have less pain in her legs and to move better    OBJECTIVE: * findings taken at evaluation, unless otherwise noted.   DIAGNOSTIC FINDINGS:   PATIENT SURVEYS:  FOTO    COGNITION:  Overall cognitive status: Within functional limits for tasks assessed     SENSATION: Denies paresthesias   POSTURE: No Significant postural limitations  PALPATION: No trigger points noted   LOWER EXTREMITY ROM:  Passive ROM Right eval Left eval  Hip flexion    Hip extension    Hip abduction    Hip adduction    Hip internal rotation    Hip external rotation    Knee flexion    Knee extension    Ankle dorsiflexion    Ankle plantarflexion    Ankle inversion    Ankle eversion     (Blank rows = not tested)  WNL  LOWER EXTREMITY MMT:  MMT Right eval Left eval right left Right  2/5 Left  2/5 Right  2/26 Left 2/26  Hip flexion 21.7 29.7 29.6 35.1 29.3 32.1 35.7 33.8  Hip extension          Hip abduction 27.6 18.5 20.7 22.4 24.1 34.2 26.8 22.8  Hip adduction          Hip internal rotation          Hip external rotation          Knee flexion          Knee extension 23.5 20.2 27.4 25.5 32.5 26.7 31.5 30.1  Ankle dorsiflexion          Ankle plantarflexion          Ankle inversion          Ankle eversion           (Blank rows = not tested)  GAIT: Decreased hip flexion; leans to the left; right hip frop. Patient required CGA  while walking and min a when she turned for balance.   Functional tests:  Sit to stand: uses's hands and needs several trials to stand from the table. Poor intial standing balance 2/5 poor psoterior balance upon initial standing   12/11 5x sit to stand: 20 seconds  2/5:  5xSTS:  11 seconds with  guarding for posterior balance   2/26 5x sit to stand 5 sec   Narrow base min A  Narrow base eyes closed min to mod A  Tandem stance Min A both ways.    BALANCE: 08/08/22 Narrow base min A  Narrow base eyes closed min to mod A  Tandem stance Min A both ways.   2/5 Balance   TODAY'S TREATMENT: 4/3 Nu step x54mins L4  LAQ 2x15 red  Seated March 2x15  Seated hip abduction 2x15 red required cuing to stay on task. See clinical assessment.   Standing heel raise 2x15  Standing march 2x15   At this time the patient reported feeling " out of it" she      3/27 Nu step x79mins L4 Sit to stand 2x10  Sit to stand with foam pad 1x10 Foam pad posterior sway. 2x10 Foam Pad Narrow BOS reach out to ball light taps. 2x10 Foam pad light taps 2x10   3/13 Air-ex:  Narrow BOS with horiz head turns   Semi-Tandem stance 30 sec each leg  Lateral step on/ off x 20 each with light UE on counter  Rhythmic stepping fwd and side to  side  x20   Hurdle with light UE at counter (2, repeated 12 times), 2nd set 6  Sit to stand 2x 10, cues for forward weight shift and overhead reach once upright   Tandem  stance with shoulder height reach crossing midline x 5 each side to cone ; overhead reach tapping cabinets, crossing midline High knee marching forward/backward 4 laps    11/04/22 At counter intermittent support:  big side steps L/R; retro gait with cues for big steps (leading L or R) Air-ex:  Narrow BOS with horiz head turns   Semi-Tandem stance 30 sec each leg  Lateral step on/ off x 10 each with light UE on counter  At counter with intermittent support:  Hurdle with light UE at counter (2,  repeated 12 times), 2nd set 6  Sit to stand x 10, cues for forward weight shift and overhead reach once upright   Wide stance with shoulder height reach crossing midline x 5 each side; overhead reach tapping cabinets, crossing midline High knee marching forward/backward  - 2x   2/26 LF Leg press 3x15 10 lbs  LF hip abduction machine 3x15  LF row machine 3x15 20 lbs   Reviewed sheet with patient and her husband. They were advised we will put it in the file with the other exercises   Performed re-assessment on the patient. Reviewed test and measures and goals including strength testing, FOTO, and ROM   Reviewed results of testing and performed balance testing with the patient  2/22 NuStep L3 x 6 min, LE/UE Cybex lat pull down 3x15, 15# Cybex tricep 0#, 2 x15# Cybex chest press 0#, 2 x5 Cybex row, 30# 2 x 15  2/19 NuStep L3 x 6:00, UE and LE Leg press 15 lbs 3x15  Tricpes press down 15 lbs 3x15  Row machine 3x15 15 lbs   Reviewed setup and safety with machines   2/15 NuStep L3 x 5:45, UE and LE  Air-ex by counter with use of gait belt (CGA/SBA):   -Forward step on/off with cues to slow down and work on steadying once on cushion; intermittent UE on counter   -lateral step ups  -NBOS with horiz and vertical head turns  Hip bumps and return to upright without use of hands    High kneeling onto air-ex on  4" box - to single foot up to/from standing  LF Leg press, seat at 10, (0#) 2 x 10 (cues to slow speed and get to full knee ext each rep)  2/12 Nu-step L5 5 min  Tricpes press down 3x15 20 lbs  Life fitness row 3x15 30 lbs  Hip abduction machine 3x15   Air-ex:  Step on and off x20  Tandem stance 2x30 sec each leg  Narrow base x20 sec each leg  Forward and backwards rocking x20  Hurdle x20   mod a for assist with all balance exercises and gait belt.   PATIENT EDUCATION:  Education details: balance exercises at counter appropriate for home Person educated:  Patient,  Education method: Explanation, Demonstration, Tactile cues, Verbal cues Education comprehension: verbalized understanding, returned demonstration, verbal cues required, tactile cues required, and needs further education   HOME EXERCISE PROGRAM:  has a program already  ASSESSMENT:  CLINICAL IMPRESSION: After 3 exercises the patient reported feeling " out of it" she reported she did not know isf she could stand. She did stand with mod a but following had difficulty maintaining an alert status. Her B/P was measured at 155 /110 on the first trial and 139/105 on the next. She reports her B/P is high at baseline. We tried a few more exercises without incident but her session was cut short. Her husband was notified of her B/P. He reports this is common. He reports if she stands or lies supine it levels out. We will re-assess after her next surgery.   OBJECTIVE IMPAIRMENTS Abnormal gait, decreased activity tolerance, decreased balance, decreased mobility, difficulty walking, decreased strength, impaired tone, and pain.   ACTIVITY LIMITATIONS carrying, lifting, bending, standing, squatting, stairs, transfers, bed mobility, dressing, and locomotion level  PARTICIPATION LIMITATIONS: meal prep, cleaning, laundry, driving, shopping, community activity, and yard work  PERSONAL FACTORS Fibromyalgia; headaches, LBBB; osteopenia  are also affecting patient's functional outcome.   REHAB POTENTIAL: Good  CLINICAL DECISION MAKING: Evolving/moderate complexity increasing pain in her quads   EVALUATION COMPLEXITY: Moderate   GOALS: Goals reviewed with patient? Yes  SHORT TERM GOALS: Target date: 07/13/2022 Patient will transfer sit to stand smoothly and independently  Baseline: Goal status: Worked on technique with sit to stand transfer today improving  2.  Patient will increase gross strength by 5lbs  Baseline:  Goal status: Met for most muscle groups 12/11  3.  Patient will be  independent with base pool program  Baseline:  Goal status: Progressing well towards goals 12/11  LONG TERM GOALS: Target date: 11/14/2022  Patient will go improve balance to CGA for all balance exercises to improve safety  Baseline:  Goal status: Improved to contact-guard today with static balance will progress towards more dynamic balance 12/11  2.  Patient will reach FOTO goal to demonstrate improved function  Baseline:  Goal status: Decreased Foto score but after review of answers initial photo was probably not answered correctly 12/11  3.  Patient will have a full balance program  Baseline:  Goal status: Will work more on the balance program as she progressed more to land 12/11   PLAN: PT FREQUENCY: 2x/week  PT DURATION: 8 weeks  PLANNED INTERVENTIONS: Therapeutic exercises, Therapeutic activity, Neuromuscular re-education, Balance training, Gait training, Patient/Family education, Self Care, Joint mobilization, Stair training, Aquatic Therapy, Cryotherapy, Moist heat, Taping, Manual therapy, and Re-evaluation  PLAN FOR NEXT SESSION: continue with gait and balance training.    Carolyne Littles PT DPT  11/30/2022 [facility] [place], [place], [id number] Phone: [phone]   Fax:  [phone]  During this treatment session, the therapist was present, participating in and directing the treatment.

## 2022-12-01 DIAGNOSIS — H269 Unspecified cataract: Secondary | ICD-10-CM | POA: Diagnosis not present

## 2022-12-01 DIAGNOSIS — H2511 Age-related nuclear cataract, right eye: Secondary | ICD-10-CM | POA: Diagnosis not present

## 2022-12-02 ENCOUNTER — Encounter (HOSPITAL_BASED_OUTPATIENT_CLINIC_OR_DEPARTMENT_OTHER): Payer: Medicare Other | Admitting: Physical Therapy

## 2022-12-02 ENCOUNTER — Ambulatory Visit: Payer: Medicare Other | Admitting: Family Medicine

## 2022-12-07 ENCOUNTER — Ambulatory Visit (HOSPITAL_BASED_OUTPATIENT_CLINIC_OR_DEPARTMENT_OTHER): Payer: Medicare Other | Admitting: Physical Therapy

## 2022-12-07 ENCOUNTER — Encounter (HOSPITAL_BASED_OUTPATIENT_CLINIC_OR_DEPARTMENT_OTHER): Payer: Self-pay | Admitting: Physical Therapy

## 2022-12-07 DIAGNOSIS — G20A1 Parkinson's disease without dyskinesia, without mention of fluctuations: Secondary | ICD-10-CM

## 2022-12-07 DIAGNOSIS — R2689 Other abnormalities of gait and mobility: Secondary | ICD-10-CM | POA: Diagnosis not present

## 2022-12-07 NOTE — Therapy (Signed)
OUTPATIENT PHYSICAL THERAPY LOWER EXTREMITY TREATMENT   Patient Name: Morgan [name] MRN: [id number] DOB:[birth year], 72 y.o., female Today's Date: 12/07/2022   PT End of Session - 12/07/22 1217     Visit Number 29    Number of Visits 36    Date for PT Re-Evaluation 12/06/22    Authorization Type progress note done on visit 33    PT Start Time 2045    PT Stop Time 2127    PT Time Calculation (min) 42 min    Activity Tolerance Patient tolerated treatment well    Behavior During Therapy WFL for tasks assessed/performed                    Past Medical History:  Diagnosis Date   Anxiety    Fibromyalgia    Headache    Hypercholesteremia    controlled on medication   Hyperlipidemia    Hypertension    no current meds   LBBB (left bundle branch block)    Melanoma    MVP (mitral valve prolapse)    Osteopenia    Parkinson disease    Pneumonia    SVD (spontaneous vaginal delivery)    x 2   Past Surgical History:  Procedure Laterality Date   BLEPHAROPLASTY     cold knife conization     COLONOSCOPY     FH colon CA   CYSTOSCOPY W/ URETERAL STENT PLACEMENT Left 10/18/2021   Procedure: CYSTOSCOPY WITH RETROGRADE PYELOGRAM, URETERAL STENT PLACEMENT;  Surgeon: Jerilee Field, MD;  Location: [facility];  Service: Urology;  Laterality: Left;   DILATATION & CURETTAGE/HYSTEROSCOPY WITH MYOSURE N/A 02/14/2018   Procedure: ATTEMPTED DILATATION & CURETTAGE/HYSTEROSCOPY WITH MYOSURE;  Surgeon: Gerald Leitz, MD;  Location: [facility];  Service: Gynecology;  Laterality: N/A;  polyp   melanoma cancer     removed from left leg   RECONSTRUCTION OF EYELID     TONSILLECTOMY     TUBAL LIGATION     URETEROSCOPY WITH HOLMIUM LASER LITHOTRIPSY Left 12/17/2021   Procedure: URETEROSCOPY/HOLMIUM LASER/STENT EXCHANGE;  Surgeon: Jerilee Field, MD;  Location: [facility];  Service: Urology;  Laterality: Left;   WISDOM TOOTH EXTRACTION     Patient Active Problem List   Diagnosis Date Noted    COVID-19 virus infection 05/16/2022   HTN (hypertension) 10/20/2021   Physical deconditioning 10/20/2021   Hydronephrosis with infection 10/18/2021   Bacteremia due to Escherichia coli 10/18/2021   Severe sepsis 10/17/2021   UTI (urinary tract infection) 10/17/2021   Acute metabolic encephalopathy 10/17/2021   Parkinson disease 10/17/2021   Cervical stenosis (uterine cervix) 02/14/2018   Postmenopausal bleeding 02/14/2018   LBBB (left bundle branch block) 12/25/2015   PVCs (premature ventricular contractions) 12/25/2015   Hyperlipidemia 12/25/2015   Fibromyalgia 01/16/2014   Cholelithiasis 04/25/2013   Paralysis agitans 01/15/2013   Unspecified hereditary and idiopathic peripheral neuropathy 01/15/2013   [name] MD PCP:   REFERRING PROVIDER: Antoine Primas MD   REFERRING DIAG: Parkinsons   THERAPY DIAG:  Other abnormalities of gait and mobility  Parkinson's disease without dyskinesia or fluctuating manifestations  Rationale for Evaluation and Treatment Rehabilitation  ONSET DATE: Several years   SUBJECTIVE:   SUBJECTIVE STATEMENT: The patient and her husband report her fibromyalgia is flairing up today. She ois having pain into her legs. She did well yesterday.   From EVAL: The patient has had Parkinsons for many years. She has had land therapy with varying degrees of success. She has recently had an onset  of bilateral leg pain that comes and goes. The pain is mostly in the front but can go down to the back as well. She is a member of the gym. She has frequent falls.   PERTINENT HISTORY: Fibromyalgia; headaches, LBBB; osteopenia   PAIN:  Are you having pain? no: NPRS scale:0/10 Pain location:  Pain description:  Aggravating factors: just comes and goes  Relieving factors: just comes and goes; sometimes ibuprofin works but she is limited as to how much she can take  PRECAUTIONS: Patient reports that because of her heart she will pass out at times.   WEIGHT  BEARING RESTRICTIONS No  FALLS:  Has patient fallen in last 6 months? Yes. Number of falls 3-4 most recently slipped on something in the closet and fell face first    LIVING ENVIRONMENT: Stairs to the second floor in her house but she dosen't do much  4 steps in the front  2 steps in the garage  OCCUPATION: retired   Presenter, broadcastingHobbies: reading; goes to an exercise class   PLOF: Independent with household mobility with device  PATIENT GOALS  To have less pain in her legs and to move better    OBJECTIVE: * findings taken at evaluation, unless otherwise noted.   DIAGNOSTIC FINDINGS:   PATIENT SURVEYS:  FOTO    COGNITION:  Overall cognitive status: Within functional limits for tasks assessed     SENSATION: Denies paresthesias   POSTURE: No Significant postural limitations  PALPATION: No trigger points noted   LOWER EXTREMITY ROM:  Passive ROM Right eval Left eval  Hip flexion    Hip extension    Hip abduction    Hip adduction    Hip internal rotation    Hip external rotation    Knee flexion    Knee extension    Ankle dorsiflexion    Ankle plantarflexion    Ankle inversion    Ankle eversion     (Blank rows = not tested)  WNL  LOWER EXTREMITY MMT:  MMT Right eval Left eval right left Right  2/5 Left  2/5 Right  2/26 Left 2/26  Hip flexion 21.7 29.7 29.6 35.1 29.3 32.1 35.7 33.8  Hip extension          Hip abduction 27.6 18.5 20.7 22.4 24.1 34.2 26.8 22.8  Hip adduction          Hip internal rotation          Hip external rotation          Knee flexion          Knee extension 23.5 20.2 27.4 25.5 32.5 26.7 31.5 30.1  Ankle dorsiflexion          Ankle plantarflexion          Ankle inversion          Ankle eversion           (Blank rows = not tested)  GAIT: Decreased hip flexion; leans to the left; right hip frop. Patient required CGA while walking and min a when she turned for balance.   Functional tests:  Sit to stand: uses's hands and needs several  trials to stand from the table. Poor intial standing balance 2/5 poor psoterior balance upon initial standing   12/11 5x sit to stand: 20 seconds  2/5:  5xSTS:  11 seconds with guarding for posterior balance   2/26 5x sit to stand 5 sec   Narrow base min A  Narrow base  eyes closed min to mod A  Tandem stance Min A both ways.    BALANCE:   08/08/22 Narrow base min A  Narrow base eyes closed min to mod A  Tandem stance Min A both ways.   2/5 Balance   TODAY'S TREATMENT: 4/10   Sit to stand training:  Trunk shift 2x10  Trunk shift onto toes 2x10  Full stand up 5x with min a for posterior loss of balance   Balance:   Narrow base 2x CGA  Narrow base eyes closed 2x min a   Normal base on air-ex 2x10 sec hold  Normal base with eyes closed 2x20 sec hold   Heel /toe on air-ex x20  Standing march x20   Sitting balance: min a and gaurding   Overhead press 2x10  Bicpe curls 2x10    4/3 Nu step x93mins L4  LAQ 2x15 red  Seated March 2x15  Seated hip abduction 2x15 red required cuing to stay on task. See clinical assessment.   Standing heel raise 2x15  Standing march 2x15   At this time the patient reported feeling " out of it" she      3/27 Nu step x30mins L4 Sit to stand 2x10  Sit to stand with foam pad 1x10 Foam pad posterior sway. 2x10 Foam Pad Narrow BOS reach out to ball light taps. 2x10 Foam pad light taps 2x10   3/13 Air-ex:  Narrow BOS with horiz head turns   Semi-Tandem stance 30 sec each leg  Lateral step on/ off x 20 each with light UE on counter  Rhythmic stepping fwd and side to  side  x20   Hurdle with light UE at counter (2, repeated 12 times), 2nd set 6  Sit to stand 2x 10, cues for forward weight shift and overhead reach once upright   Tandem  stance with shoulder height reach crossing midline x 5 each side to cone ; overhead reach tapping cabinets, crossing midline High knee marching forward/backward 4 laps    11/04/22 At  counter intermittent support:  big side steps L/R; retro gait with cues for big steps (leading L or R) Air-ex:  Narrow BOS with horiz head turns   Semi-Tandem stance 30 sec each leg  Lateral step on/ off x 10 each with light UE on counter  At counter with intermittent support:  Hurdle with light UE at counter (2, repeated 12 times), 2nd set 6  Sit to stand x 10, cues for forward weight shift and overhead reach once upright   Wide stance with shoulder height reach crossing midline x 5 each side; overhead reach tapping cabinets, crossing midline High knee marching forward/backward  - 2x   2/26 LF Leg press 3x15 10 lbs  LF hip abduction machine 3x15  LF row machine 3x15 20 lbs   Reviewed sheet with patient and her husband. They were advised we will put it in the file with the other exercises   Performed re-assessment on the patient. Reviewed test and measures and goals including strength testing, FOTO, and ROM   Reviewed results of testing and performed balance testing with the patient    PATIENT EDUCATION:  Education details: balance exercises at counter appropriate for home Person educated: Patient,  Education method: Explanation, Demonstration, Tactile cues, Verbal cues Education comprehension: verbalized understanding, returned demonstration, verbal cues required, tactile cues required, and needs further education   HOME EXERCISE PROGRAM:  has a program already  ASSESSMENT:  CLINICAL IMPRESSION: The patient tolerated treatment well  today despite her fibro flair. We worked sitting balance, standing technique, and standing balance. She reported no increase in pain in her legs but she started with high levels. Therapy will continue to work on balance with the patient. She has been going to the gym with her husband and doing the Parkinson's calss.  OBJECTIVE IMPAIRMENTS Abnormal gait, decreased activity tolerance, decreased balance, decreased mobility, difficulty walking,  decreased strength, impaired tone, and pain.   ACTIVITY LIMITATIONS carrying, lifting, bending, standing, squatting, stairs, transfers, bed mobility, dressing, and locomotion level  PARTICIPATION LIMITATIONS: meal prep, cleaning, laundry, driving, shopping, community activity, and yard work  PERSONAL FACTORS Fibromyalgia; headaches, LBBB; osteopenia  are also affecting patient's functional outcome.   REHAB POTENTIAL: Good  CLINICAL DECISION MAKING: Evolving/moderate complexity increasing pain in her quads   EVALUATION COMPLEXITY: Moderate   GOALS: Goals reviewed with patient? Yes  SHORT TERM GOALS: Target date: 07/13/2022 Patient will transfer sit to stand smoothly and independently  Baseline: Goal status: Worked on technique with sit to stand transfer today improving  2.  Patient will increase gross strength by 5lbs  Baseline:  Goal status: Met for most muscle groups 12/11  3.  Patient will be independent with base pool program  Baseline:  Goal status: Progressing well towards goals 12/11  LONG TERM GOALS: Target date: 11/14/2022  Patient will go improve balance to CGA for all balance exercises to improve safety  Baseline:  Goal status: Improved to contact-guard today with static balance will progress towards more dynamic balance 12/11  2.  Patient will reach FOTO goal to demonstrate improved function  Baseline:  Goal status: Decreased Foto score but after review of answers initial photo was probably not answered correctly 12/11  3.  Patient will have a full balance program  Baseline:  Goal status: Will work more on the balance program as she progressed more to land 12/11   PLAN: PT FREQUENCY: 2x/week  PT DURATION: 8 weeks  PLANNED INTERVENTIONS: Therapeutic exercises, Therapeutic activity, Neuromuscular re-education, Balance training, Gait training, Patient/Family education, Self Care, Joint mobilization, Stair training, Aquatic Therapy, Cryotherapy, Moist heat,  Taping, Manual therapy, and Re-evaluation  PLAN FOR NEXT SESSION: continue with gait and balance training.    Lorayne Bender PT DPT  11/30/2022 [facility] [address] [place], [place], [address] Phone: [phone]   Fax:  [phone]  During this treatment session, the therapist was present, participating in and directing the treatment.

## 2022-12-08 DIAGNOSIS — E785 Hyperlipidemia, unspecified: Secondary | ICD-10-CM | POA: Diagnosis not present

## 2022-12-09 ENCOUNTER — Ambulatory Visit (HOSPITAL_BASED_OUTPATIENT_CLINIC_OR_DEPARTMENT_OTHER): Payer: Medicare Other | Admitting: Physical Therapy

## 2022-12-09 DIAGNOSIS — R2689 Other abnormalities of gait and mobility: Secondary | ICD-10-CM | POA: Diagnosis not present

## 2022-12-09 DIAGNOSIS — G20A1 Parkinson's disease without dyskinesia, without mention of fluctuations: Secondary | ICD-10-CM
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
## 2022-12-13 DIAGNOSIS — Z0000 Encounter for general adult medical examination without abnormal findings: Secondary | ICD-10-CM | POA: Diagnosis not present

## 2022-12-13 DIAGNOSIS — G903 Multi-system degeneration of the autonomic nervous system: Secondary | ICD-10-CM | POA: Diagnosis not present

## 2022-12-13 DIAGNOSIS — I10 Essential (primary) hypertension: Secondary | ICD-10-CM | POA: Diagnosis not present

## 2022-12-13 DIAGNOSIS — Z1331 Encounter for screening for depression: Secondary | ICD-10-CM | POA: Diagnosis not present

## 2022-12-13 DIAGNOSIS — F419 Anxiety disorder, unspecified: Secondary | ICD-10-CM | POA: Diagnosis not present

## 2022-12-13 DIAGNOSIS — M810 Age-related osteoporosis without current pathological fracture: Secondary | ICD-10-CM | POA: Diagnosis not present

## 2022-12-13 DIAGNOSIS — E785 Hyperlipidemia, unspecified: Secondary | ICD-10-CM | POA: Diagnosis not present

## 2022-12-13 DIAGNOSIS — I447 Left bundle-branch block, unspecified: Secondary | ICD-10-CM | POA: Diagnosis not present

## 2022-12-13 DIAGNOSIS — G20B2 Parkinson's disease with dyskinesia, with fluctuations: Secondary | ICD-10-CM | POA: Diagnosis not present

## 2022-12-21 ENCOUNTER — Ambulatory Visit (HOSPITAL_BASED_OUTPATIENT_CLINIC_OR_DEPARTMENT_OTHER): Payer: Medicare Other | Admitting: Physical Therapy

## 2022-12-21 DIAGNOSIS — G20A1 Parkinson's disease without dyskinesia, without mention of fluctuations: Secondary | ICD-10-CM

## 2022-12-21 DIAGNOSIS — R2689 Other abnormalities of gait and mobility: Secondary | ICD-10-CM
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
## 2022-12-24 ENCOUNTER — Other Ambulatory Visit: Payer: Self-pay | Admitting: Neurology

## 2022-12-24 DIAGNOSIS — G20A1 Parkinson's disease without dyskinesia, without mention of fluctuations: Secondary | ICD-10-CM

## 2022-12-24 DIAGNOSIS — G20B1 Parkinson's disease with dyskinesia, without mention of fluctuations: Secondary | ICD-10-CM

## 2022-12-28 ENCOUNTER — Encounter (HOSPITAL_BASED_OUTPATIENT_CLINIC_OR_DEPARTMENT_OTHER): Payer: Medicare Other | Admitting: Physical Therapy

## 2022-12-29 NOTE — Progress Notes (Signed)
[facility] [address] [place] [address] Phone: [phone] Subjective:   Morgan [name], am serving as a scribe for Dr. Antoine Primas.  I'm seeing this patient by the request  of:  [name], MD  CC: Low back pain follow-up  BJY:[id number]  10/18/2022 Patient has this as well as underlying Parkinson's disease, paralysis agitans as well as fibromyalgia that are all contributing.  Patient does have some difficulty with her work ethic per her son.  Discussed with patient about continuing to stay active.  She does believe that she has some difficulty doing that on her own and would like to continue with the home exercises but also of the aquatic therapy for now.  Will see how patient responds.  Increase activity as tolerated.  Follow-up with me again in 2 to 3 months.  Total time reviewing patient's notes as well as discussing with patient and caregiver 32 minutes     Update 12/30/2022 Morgan [name] is a 72 y.o. female coming in with complaint of lumbar spine pain. Patient states finished PT today. Has helped a little. Has switched up the way she takes her medication.  Patient feels like the physical therapy was very beneficial.  Continues to have tightness in the low back.  Patient does have tightness with FABER test bilaterally still she states.  Is feeling no more confident than what she was doing previously.  Patient is accompanied with family member who also states he has seen improvement       Past Medical History:  Diagnosis Date   Anxiety    Fibromyalgia    Headache    Hypercholesteremia    controlled on medication   Hyperlipidemia    Hypertension    no current meds   LBBB (left bundle branch block)    Melanoma (HCC)    MVP (mitral valve prolapse)    Osteopenia    Parkinson disease    Pneumonia    SVD (spontaneous vaginal delivery)    x 2   Past Surgical History:  Procedure Laterality Date   BLEPHAROPLASTY     cold knife  conization     COLONOSCOPY     FH colon CA   CYSTOSCOPY W/ URETERAL STENT PLACEMENT Left 10/18/2021   Procedure: CYSTOSCOPY WITH RETROGRADE PYELOGRAM, URETERAL STENT PLACEMENT;  Surgeon: Jerilee Field, MD;  Location: [facility];  Service: Urology;  Laterality: Left;   DILATATION & CURETTAGE/HYSTEROSCOPY WITH MYOSURE N/A 02/14/2018   Procedure: ATTEMPTED DILATATION & CURETTAGE/HYSTEROSCOPY WITH MYOSURE;  Surgeon: Gerald Leitz, MD;  Location: [facility];  Service: Gynecology;  Laterality: N/A;  polyp   melanoma cancer     removed from left leg   RECONSTRUCTION OF EYELID     TONSILLECTOMY     TUBAL LIGATION     URETEROSCOPY WITH HOLMIUM LASER LITHOTRIPSY Left 12/17/2021   Procedure: URETEROSCOPY/HOLMIUM LASER/STENT EXCHANGE;  Surgeon: Jerilee Field, MD;  Location: [facility];  Service: Urology;  Laterality: Left;   WISDOM TOOTH EXTRACTION     Social History   Socioeconomic History   Marital status: Married    Spouse name: Not on file   Number of children: 2   Years of education: Not on file   Highest education level: Bachelor's degree (e.g., BA, AB, BS)  Occupational History   Occupation: retired    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  Tobacco Use   Smoking status: Former    Packs/day: .25    Types: Cigarettes    Quit  date: 09/29/1997    Years since quitting: 25.2   Smokeless tobacco: Never   Tobacco comments:    chews Nicorette, 10-12/day  Vaping Use   Vaping Use: Never used  Substance and Sexual Activity   Alcohol use: Yes    Comment: Occasional wine   Drug use: No   Sexual activity: Not on file  Other Topics Concern   Not on file  Social History Narrative   right handed   2 story home    Lives with spouse   Social Determinants of Health   Financial Resource Strain: Not on file  Food Insecurity: No Food Insecurity (05/17/2022)   Hunger Vital Sign    Worried About Running Out of Food in the Last Year: Never true    Ran Out of Food in the Last Year: Never true   Transportation Needs: No Transportation Needs (05/31/2022)   PRAPARE - Administrator, Civil Service (Medical): No    Lack of Transportation (Non-Medical): No  Physical Activity: Not on file  Stress: Not on file  Social Connections: Not on file   Allergies  Allergen Reactions   Other Anaphylaxis    "Anti Seizure Medications"   Phenergan [Promethazine Hcl] Other (See Comments)    Makes Parkinson's worse   Acetaminophen Other (See Comments)    "I just get weird"   Itraconazole Hives   Tamsulosin Other (See Comments)    B/P DROPPED TOO MUCH!!   Family History  Problem Relation Age of Onset   Cancer Mother        colon   Healthy Brother    Healthy Sister    Healthy Child     Current Outpatient Medications (Endocrine & Metabolic):    raloxifene (EVISTA) 60 MG tablet, Take 60 mg by mouth in the morning.  Current Outpatient Medications (Cardiovascular):    atorvastatin (LIPITOR) 10 MG tablet, Take 10 mg by mouth in the morning.   midodrine (PROAMATINE) 2.5 MG tablet, Take 1 tablet (2.5 mg total) by mouth 3 (three) times daily as needed (for systolic blood pressure less than 100).   Current Outpatient Medications (Analgesics):    aspirin EC 81 MG tablet, Take 81 mg by mouth daily. Swallow whole.   Ibuprofen 200 MG CAPS, Take 2 capsules (400 mg total) by mouth every 8 (eight) hours as needed (Leg pain, headache or mild pain).   Current Outpatient Medications (Other):    ALPRAZolam (XANAX) 0.25 MG tablet, Take 0.25 mg by mouth daily as needed for anxiety.    BLINK TEARS 0.25 % SOLN, Place 1 drop into both eyes 3 (three) times daily as needed (for dryness).   carbidopa-levodopa (SINEMET IR) 25-100 MG tablet, 1 tablet every 2 hours starting at 6-7am (getting about 6-7 in per day)The medication have a score line so it can be broken.   Cholecalciferol (VITAMIN D3) 25 MCG (1000 UT) CHEW, Chew 2,500 Units by mouth daily.   Coenzyme Q10-Vitamin E (QUNOL ULTRA COQ10 PO), Take  1 capsule by mouth daily.   CYMBALTA 60 MG capsule, TAKE 1 CAPSULE EVERY       MORNING   gabapentin (NEURONTIN) 400 MG capsule, Take 1 capsule (400 mg total) by mouth 3 (three) times daily as needed (for leg pain or soreness).   Levodopa (INBRIJA) 42 MG CAPS, You can inhale the capsules as needed up to 5 times per day, separated by 2 hour intervals.   methocarbamol (ROBAXIN) 750 MG tablet, Take 750 mg by mouth every 6 (  six) hours as needed for muscle spasms (or leg pain).   Multiple Vitamins-Minerals (PRESERVISION AREDS 2 PO), Take 1 capsule by mouth 2 (two) times daily.   Pramipexole Dihydrochloride 0.75 MG TB24, TAKE 1 TABLET DAILY   ramelteon (ROZEREM) 8 MG tablet, TAKE 1 TABLET AT BEDTIME.   TART CHERRY PO, Take 3,000 mg by mouth daily.   Reviewed prior external information including notes and imaging from  primary care provider As well as notes that were available from care everywhere and other healthcare systems.  Past medical history, social, surgical and family history all reviewed in electronic medical record.  No pertanent information unless stated regarding to the chief complaint.   Review of Systems:  No headache, visual changes, nausea, vomiting, diarrhea, constipation, dizziness, abdominal pain, skin rash, fevers, chills, night sweats, weight loss, swollen lymph nodes, , joint swelling, chest pain, shortness of breath, mood changes. POSITIVE muscle aches, body aches  Objective  Blood pressure 118/76, pulse 97, height 5\' 4"  (1.626 m), weight 159 lb (72.1 kg), SpO2 90 %.   General: No apparent distress alert and oriented x3 mood and affect normal, dressed appropriately.  HEENT: Pupils equal, extraocular movements intact  Respiratory: Patient's speak in full sentences and does not appear short of breath  Antalgic gait noted.  Patient does have a cane.  Patient does have the dyskinesis movements noted.  Still has some cogwheeling noted as well though of the extremities.  Tardive  dyskinesia    Impression and Recommendations:     The above documentation has been reviewed and is accurate and complete Judi Saa, DO

## 2022-12-30 ENCOUNTER — Ambulatory Visit (INDEPENDENT_AMBULATORY_CARE_PROVIDER_SITE_OTHER): Payer: Medicare Other | Admitting: Family Medicine

## 2022-12-30 ENCOUNTER — Ambulatory Visit (HOSPITAL_BASED_OUTPATIENT_CLINIC_OR_DEPARTMENT_OTHER): Payer: Medicare Other | Attending: Family Medicine | Admitting: Physical Therapy

## 2022-12-30 ENCOUNTER — Encounter (HOSPITAL_BASED_OUTPATIENT_CLINIC_OR_DEPARTMENT_OTHER): Payer: Self-pay | Admitting: Physical Therapy

## 2022-12-30 VITALS — BP 118/76 | HR 97 | Ht 64.0 in | Wt 159.0 lb

## 2022-12-30 DIAGNOSIS — G20A1 Parkinson's disease without dyskinesia, without mention of fluctuations: Secondary | ICD-10-CM

## 2022-12-30 DIAGNOSIS — G20B2 Parkinson's disease with dyskinesia, with fluctuations: Secondary | ICD-10-CM | POA: Diagnosis not present

## 2022-12-30 DIAGNOSIS — R5381 Other malaise: Secondary | ICD-10-CM | POA: Diagnosis not present

## 2022-12-30 DIAGNOSIS — R2689 Other abnormalities of gait and mobility: Secondary | ICD-10-CM | POA: Insufficient documentation

## 2022-12-30 NOTE — Patient Instructions (Signed)
See me in 2-3 months

## 2022-12-30 NOTE — Assessment & Plan Note (Signed)
Patient has done significantly better at this time.  Still has the agitation secondary to patient's medications.  Likely family member is not really helping.  Some of the pain is multifactorial with the fibromyalgia as well as the Parkinson's causing hypertonicity.  Patient did very well with the physical therapy and encouraged her to continue the home exercises on her own as well as doing personal training.  Patient seems very optimistic which I am very hopeful for as well.  Follow-up with me again in 3 months
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
## 2023-01-01 ENCOUNTER — Other Ambulatory Visit: Payer: Self-pay | Admitting: Neurology

## 2023-01-01 DIAGNOSIS — G20B1 Parkinson's disease with dyskinesia, without mention of fluctuations: Secondary | ICD-10-CM

## 2023-01-01 DIAGNOSIS — G20A1 Parkinson's disease without dyskinesia, without mention of fluctuations: Secondary | ICD-10-CM

## 2023-01-05 DIAGNOSIS — G20B2 Parkinson's disease with dyskinesia, with fluctuations: Secondary | ICD-10-CM | POA: Diagnosis not present

## 2023-01-05 DIAGNOSIS — R413 Other amnesia: Secondary | ICD-10-CM | POA: Diagnosis not present

## 2023-01-19 DIAGNOSIS — N3000 Acute cystitis without hematuria: Secondary | ICD-10-CM | POA: Diagnosis not present

## 2023-01-30 ENCOUNTER — Emergency Department (HOSPITAL_COMMUNITY)
Admission: EM | Admit: 2023-01-30 | Discharge: 2023-01-31 | Disposition: A | Discharge status: Home / Self Care | Payer: Medicare Other | Attending: Emergency Medicine | Admitting: Emergency Medicine

## 2023-01-30 ENCOUNTER — Encounter (HOSPITAL_COMMUNITY): Payer: Self-pay

## 2023-01-30 DIAGNOSIS — Z79899 Other long term (current) drug therapy: Secondary | ICD-10-CM | POA: Diagnosis not present

## 2023-01-30 DIAGNOSIS — W19XXXA Unspecified fall, initial encounter: Secondary | ICD-10-CM | POA: Diagnosis not present

## 2023-01-30 DIAGNOSIS — I10 Essential (primary) hypertension: Secondary | ICD-10-CM | POA: Diagnosis not present

## 2023-01-30 DIAGNOSIS — G20C Parkinsonism, unspecified: Secondary | ICD-10-CM | POA: Insufficient documentation

## 2023-01-30 DIAGNOSIS — I159 Secondary hypertension, unspecified: Secondary | ICD-10-CM | POA: Insufficient documentation

## 2023-01-30 LAB — BASIC METABOLIC PANEL
Anion gap: 7 (ref 5–15)
BUN: 17 mg/dL (ref 8–23)
CO2: 29 mmol/L (ref 22–32)
Calcium: 9 mg/dL (ref 8.9–10.3)
Chloride: 101 mmol/L (ref 98–111)
Creatinine, Ser: 0.89 mg/dL (ref 0.44–1.00)
GFR, Estimated: >60 mL/min (ref >60)
Glucose, Bld: 104 mg/dL — ABNORMAL HIGH (ref 70–99)
Potassium: 3.7 mmol/L (ref 3.5–5.1)
Sodium: 137 mmol/L (ref 135–145)

## 2023-01-30 LAB — CBC
HCT: 40.9 % (ref 36.0–46.0)
Hemoglobin: 13 g/dL (ref 12.0–15.0)
MCH: 29 pg (ref 26.0–34.0)
MCHC: 31.8 g/dL (ref 30.0–36.0)
MCV: 91.1 fL (ref 80.0–100.0)
Platelets: 234 10*3/uL (ref 150–400)
RBC: 4.49 MIL/uL (ref 3.87–5.11)
RDW: 13.2 % (ref 11.5–15.5)
WBC: 7.7 10*3/uL (ref 4.0–10.5)
nRBC: 0 % (ref 0.0–0.2)

## 2023-01-30 LAB — URINALYSIS, ROUTINE W REFLEX MICROSCOPIC
Bilirubin Urine: NEGATIVE
Glucose, UA: NEGATIVE mg/dL
Hgb urine dipstick: NEGATIVE
Ketones, ur: NEGATIVE mg/dL
Leukocytes,Ua: NEGATIVE
Nitrite: NEGATIVE
Protein, ur: NEGATIVE mg/dL
Specific Gravity, Urine: 1.009 (ref 1.005–1.030)
pH: 7 (ref 5.0–8.0)

## 2023-01-30 LAB — ETHANOL: Alcohol, Ethyl (B): <10 mg/dL (ref <10)

## 2023-01-30 MED ORDER — AMLODIPINE BESYLATE 5 MG PO TABS
5.0000 mg | ORAL_TABLET | Freq: Once | ORAL | Status: AC
  Administered 2023-01-30: 5 mg via ORAL
  Filled 2023-01-30: qty 1

## 2023-01-30 NOTE — ED Notes (Signed)
Husband Huldah Otterman [phone] would like an update asap

## 2023-01-30 NOTE — ED Notes (Signed)
Ptar called 

## 2023-01-30 NOTE — ED Notes (Signed)
Shon Hale- pt's friend / neighbor: [phone].
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
## 2023-01-31 DIAGNOSIS — Z7401 Bed confinement status: Secondary | ICD-10-CM | POA: Diagnosis not present

## 2023-01-31 DIAGNOSIS — I10 Essential (primary) hypertension: Secondary | ICD-10-CM | POA: Diagnosis not present

## 2023-01-31 MED ORDER — AMLODIPINE BESYLATE 2.5 MG PO TABS: 2.5000 mg | ORAL_TABLET | Freq: Every day | ORAL | 0 refills | Status: DC

## 2023-01-31 NOTE — Discharge Instructions (Signed)
It is noted that your blood pressure was elevated today, I recommend keeping a log of your blood pressure over the next 2 weeks, take it twice a day, I did prescribe you amlodipine, I would start taking this if the top number remains above 150 over the next couple days.  But if you start taking this blood pressure medication and it drops her blood pressure too low top numbers below 100, feel lightheaded or dizziness please discontinue please follow-up with your primary doctor for further assessment.  Come back to the emergency department if you develop chest pain, shortness of breath, severe abdominal pain, uncontrolled nausea, vomiting, diarrhea.

## 2023-02-08 DIAGNOSIS — Z87440 Personal history of urinary (tract) infections: Secondary | ICD-10-CM | POA: Diagnosis not present

## 2023-02-08 DIAGNOSIS — R32 Unspecified urinary incontinence: Secondary | ICD-10-CM | POA: Diagnosis not present

## 2023-02-08 DIAGNOSIS — I10 Essential (primary) hypertension: Secondary | ICD-10-CM | POA: Diagnosis not present
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
## 2023-03-10 ENCOUNTER — Ambulatory Visit (INDEPENDENT_AMBULATORY_CARE_PROVIDER_SITE_OTHER): Payer: Medicare Other | Admitting: Family Medicine

## 2023-03-10 ENCOUNTER — Encounter: Payer: Self-pay | Admitting: Family Medicine

## 2023-03-10 VITALS — BP 130/78 | HR 85 | Ht 64.0 in

## 2023-03-10 DIAGNOSIS — G20B2 Parkinson's disease with dyskinesia, with fluctuations: Secondary | ICD-10-CM

## 2023-03-10 DIAGNOSIS — M797 Fibromyalgia: Secondary | ICD-10-CM | POA: Diagnosis not present

## 2023-03-10 DIAGNOSIS — M545 Low back pain, unspecified: Secondary | ICD-10-CM

## 2023-03-10 NOTE — Patient Instructions (Addendum)
Good to see You  PT ordered to [facility]  Lets check in in 3 months

## 2023-03-10 NOTE — Assessment & Plan Note (Signed)
Patient still has a Parkinson's.  Does have some cogwheeling noted.  Mild shuffling gait.  Did do significantly better when patient was doing formal physical therapy and I do think that this can contribute and help her with her overall wellbeing.  I think also with her being active and has been significantly beneficial for the fibromyalgia.  Patient is also felt like she is using less of her Parkinson's medications.  We discussed with patient other ways to increase activity safely.  Referred back to formal physical therapy.  Follow-up in 3 months

## 2023-03-20 ENCOUNTER — Ambulatory Visit (HOSPITAL_BASED_OUTPATIENT_CLINIC_OR_DEPARTMENT_OTHER): Payer: Medicare Other | Attending: Family Medicine | Admitting: Physical Therapy

## 2023-03-20 DIAGNOSIS — M545 Low back pain, unspecified: Secondary | ICD-10-CM | POA: Diagnosis not present

## 2023-03-20 DIAGNOSIS — G20A1 Parkinson's disease without dyskinesia, without mention of fluctuations: Secondary | ICD-10-CM | POA: Insufficient documentation

## 2023-03-20 DIAGNOSIS — R2689 Other abnormalities of gait and mobility: Secondary | ICD-10-CM | POA: Diagnosis not present

## 2023-03-20 DIAGNOSIS — G20B2 Parkinson's disease with dyskinesia, with fluctuations: Secondary | ICD-10-CM | POA: Insufficient documentation

## 2023-03-20 DIAGNOSIS — M797 Fibromyalgia: Secondary | ICD-10-CM | POA: Diagnosis not present

## 2023-03-20 NOTE — Therapy (Signed)
OUTPATIENT PHYSICAL THERAPY LOWER EXTREMITY EVALUATION   Patient Name: Morgan [name] MRN: [id number] DOB:12/08/1950, 72 y.o., female Today's Date: 03/21/2023   PT End of Session - 03/21/23 1038     Visit Number 1    Number of Visits 24    Date for PT Re-Evaluation 06/13/23    Authorization Type progress note at visit 10    PT Start Time 1315    PT Stop Time 1403    PT Time Calculation (min) 48 min    Activity Tolerance Patient tolerated treatment well    Behavior During Therapy WFL for tasks assessed/performed              Past Medical History:  Diagnosis Date   Anxiety    Fibromyalgia    Headache    Hypercholesteremia    controlled on medication   Hyperlipidemia    Hypertension    no current meds   LBBB (left bundle branch block)    Melanoma (HCC)    MVP (mitral valve prolapse)    Osteopenia    Parkinson disease    Pneumonia    SVD (spontaneous vaginal delivery)    x 2   Past Surgical History:  Procedure Laterality Date   BLEPHAROPLASTY     cold knife conization     COLONOSCOPY     FH colon CA   CYSTOSCOPY W/ URETERAL STENT PLACEMENT Left 10/18/2021   Procedure: CYSTOSCOPY WITH RETROGRADE PYELOGRAM, URETERAL STENT PLACEMENT;  Surgeon: Jerilee Field, MD;  Location: [facility];  Service: Urology;  Laterality: Left;   DILATATION & CURETTAGE/HYSTEROSCOPY WITH MYOSURE N/A 02/14/2018   Procedure: ATTEMPTED DILATATION & CURETTAGE/HYSTEROSCOPY WITH MYOSURE;  Surgeon: Gerald Leitz, MD;  Location: [facility];  Service: Gynecology;  Laterality: N/A;  polyp   melanoma cancer     removed from left leg   RECONSTRUCTION OF EYELID     TONSILLECTOMY     TUBAL LIGATION     URETEROSCOPY WITH HOLMIUM LASER LITHOTRIPSY Left 12/17/2021   Procedure: URETEROSCOPY/HOLMIUM LASER/STENT EXCHANGE;  Surgeon: Jerilee Field, MD;  Location: [facility];  Service: Urology;  Laterality: Left;   WISDOM TOOTH EXTRACTION     Patient Active Problem List   Diagnosis Date Noted   COVID-19 virus  infection 05/16/2022   HTN (hypertension) 10/20/2021   Physical deconditioning 10/20/2021   Hydronephrosis with infection 10/18/2021   Bacteremia due to Escherichia coli 10/18/2021   Severe sepsis (HCC) 10/17/2021   UTI (urinary tract infection) 10/17/2021   Acute metabolic encephalopathy 10/17/2021   Parkinson disease 10/17/2021   Cervical stenosis (uterine cervix) 02/14/2018   Postmenopausal bleeding 02/14/2018   LBBB (left bundle branch block) 12/25/2015   PVCs (premature ventricular contractions) 12/25/2015   Hyperlipidemia 12/25/2015   Fibromyalgia 01/16/2014   Cholelithiasis 04/25/2013   Paralysis agitans 01/15/2013   Unspecified hereditary and idiopathic peripheral neuropathy 01/15/2013   [name] MD PCP:   REFERRING PROVIDER: Antoine Primas MD   REFERRING DIAG: Parkinsons   THERAPY DIAG:  Other abnormalities of gait and mobility  Parkinson's disease without dyskinesia or fluctuating manifestations  Rationale for Evaluation and Treatment Rehabilitation  ONSET DATE: Several years   SUBJECTIVE:   SUBJECTIVE STATEMENT: The patient has had Parkisnons for many years. She has had land therapy with varying degrees of success. She recently went through a bout of physical therapy She worked on a gym program. She has been unable to effectively execute her gym program 2nd to a decline in overall mobility. Her husband reports that there has  been a noticeable decline in her mobility since she stopped attending therapy. She continues to have intermittent pain in her legs. She had a fall a few days ago in which she hit her elbow. She continues to have frequent falls.   PERTINENT HISTORY: Fibromyalgia; headaches, LBBB; osteopenia   PAIN:  Are you having pain? Yes: NPRS scale: 5/10 right now;  Pain location: down the front of her legs and and go to the back of calf's  Pain description:  ache  Aggravating factors: just comes and goes  Relieving factors: just comes and goes;  sometimes ibuprofin works but she is limited as to how much she can take  PRECAUTIONS: Patient reports that because of her heart she will pass out at times.   WEIGHT BEARING RESTRICTIONS No  FALLS:  Has patient fallen in last 6 months? Yes.  6 falls but likley more. Last fall    LIVING ENVIRONMENT: Stairs to the second floor in her house but she dosen't do much  4 steps in the front  2 steps in the garage  OCCUPATION: retired   Presenter, broadcasting: reading; goes to an exercise class   PLOF: Independent with household mobility with device  PATIENT GOALS  To have less pain in her legs and to move better    OBJECTIVE:   DIAGNOSTIC FINDINGS:   PATIENT SURVEYS:  FOTO    COGNITION:  Overall cognitive status: Within functional limits for tasks assessed     SENSATION: Denies paresthesias   POSTURE: No Significant postural limitations  PALPATION: No trigger points noted   LOWER EXTREMITY ROM:  Passive ROM Right eval Left eval  Hip flexion    Hip extension    Hip abduction    Hip adduction    Hip internal rotation    Hip external rotation    Knee flexion    Knee extension    Ankle dorsiflexion    Ankle plantarflexion    Ankle inversion    Ankle eversion     (Blank rows = not tested)  WNL  LOWER EXTREMITY MMT:  MMT Right eval Left eval Right  7/22 Left  7/2  Hip flexion 21.7 29.7 23.5 25.9  Hip extension      Hip abduction 27.6 18.5 19.1 8.7  Hip adduction      Hip internal rotation      Hip external rotation      Knee flexion      Knee extension 23.5 20.2 17.7 16.8  Ankle dorsiflexion      Ankle plantarflexion      Ankle inversion      Ankle eversion       (Blank rows = not tested)  GAIT: Decreased hip flexion; leans to the left; right hip frop. Patient required CGA while walking and min a when she turned for balance. No change from IE   Functional tests:  Sit to stand: cuing to catch balance. Uses momentum to stand.    5x sit to stand Balance:  13 sec     Narrow base min a  Narrow base eyes closed min to mod a  Tandem stance Min a both ways.  Single leg stance 5 sec bilateral    BERG Balance Test          Date:   Sit to Stand 1  Standing unsupported 3  Sitting with back unsupported but feet supported 1  Stand to sit  1  Transfers  2  Standing unsupported with eyes closed 3  Standing unsupported feet together 3  From standing position, reach forward with outstretched arm 1  From standing position, pick up object from floor 1  From standing position, turn and look behind over each shoulder 1  Turn 360 2  Standing unsupported, alternately place foot on step 1  Standing unsupported, one foot in front 1  Standing on one leg 0  Total:    21/30    TODAY'S TREATMENT: Has full home program from previous treatment    PATIENT EDUCATION:  Education details: reviewed HEP; symptom management; safety with gait.   Person educated: Patient Education method: Explanation, Demonstration, Tactile cues, Verbal cues, and Handouts Education comprehension: verbalized understanding, returned demonstration, verbal cues required, tactile cues required, and needs further education   HOME EXERCISE PROGRAM:  has a program already   ASSESSMENT:  CLINICAL IMPRESSION: Patient is a 72 y.o. female who was seen today for physical therapy evaluation and treatment for Parkisnons along with chronic leg pain.. She has minor deficits on strength. She is a fall risk at this time. Per BERG balance test she is a high fall risk. We will perform a DGI next visit. She has weakness compared to the last time her strength was measured. At this time she would benefit from skilled therapy to improve overall mobility and improve her ability to perform exercises.   OBJECTIVE IMPAIRMENTS Abnormal gait, decreased activity tolerance, decreased balance, decreased mobility, difficulty walking, decreased strength, impaired tone, and pain.   ACTIVITY LIMITATIONS  carrying, lifting, bending, standing, squatting, stairs, transfers, bed mobility, dressing, and locomotion level  PARTICIPATION LIMITATIONS: meal prep, cleaning, laundry, driving, shopping, community activity, and yard work  PERSONAL FACTORS Fibromyalgia; headaches, LBBB; osteopenia  are also affecting patient's functional outcome.   REHAB POTENTIAL: Good  CLINICAL DECISION MAKING: Evolving/moderate complexity increasing pain in her quads   EVALUATION COMPLEXITY: Moderate   GOALS: Goals reviewed with patient? Yes  SHORT TERM GOALS: Target date: 04/18/2023  Patient will transfer sit to stand smoothly and independently  Baseline: Goal status: INITIAL  2.  Patient will increase gross strength by 5lbs  Baseline:  Goal status: INITIAL  3.  Patient will improve BERG score by 10  Baseline:  Goal status: INITIAL LONG TERM GOALS: Target date: 05/16/2023   Patient will go improve balance to CGA for all balance exercises to improve safety  Baseline:  Goal status: INITIAL  2.  Patient will report a 50% reduction in falls  Baseline:  Goal status: INITIAL  3.  Patient will have a full balance program  Baseline:  Goal status: INITIAL   PLAN: PT FREQUENCY: 2x/week  PT DURATION: 8 weeks  PLANNED INTERVENTIONS: Therapeutic exercises, Therapeutic activity, Neuromuscular re-education, Balance training, Gait training, Patient/Family education, Self Care, Joint mobilization, Stair training, Aquatic Therapy, Cryotherapy, Moist heat, Taping, Manual therapy, and Re-evaluation  PLAN FOR NEXT SESSION: work on improving sit to stand, posterior, dynamic, and static balance. Work on general conditioning. Be aware of leg pain on any given day.     Dessie Coma, PT 03/21/2023, 11:50 AM

## 2023-03-21 ENCOUNTER — Encounter (HOSPITAL_BASED_OUTPATIENT_CLINIC_OR_DEPARTMENT_OTHER): Payer: Self-pay | Admitting: Physical Therapy

## 2023-03-21 ENCOUNTER — Other Ambulatory Visit: Payer: Self-pay

## 2023-03-23 DIAGNOSIS — G20B2 Parkinson's disease with dyskinesia, with fluctuations: Secondary | ICD-10-CM | POA: Diagnosis not present

## 2023-04-04 ENCOUNTER — Ambulatory Visit (HOSPITAL_BASED_OUTPATIENT_CLINIC_OR_DEPARTMENT_OTHER): Payer: Medicare Other | Admitting: Physical Therapy

## 2023-04-05 ENCOUNTER — Ambulatory Visit (HOSPITAL_BASED_OUTPATIENT_CLINIC_OR_DEPARTMENT_OTHER): Payer: Medicare Other | Attending: Family Medicine | Admitting: Physical Therapy

## 2023-04-05 ENCOUNTER — Encounter (HOSPITAL_BASED_OUTPATIENT_CLINIC_OR_DEPARTMENT_OTHER): Payer: Self-pay | Admitting: Physical Therapy

## 2023-04-05 DIAGNOSIS — N3946 Mixed incontinence: Secondary | ICD-10-CM | POA: Diagnosis not present

## 2023-04-05 DIAGNOSIS — G20A1 Parkinson's disease without dyskinesia, without mention of fluctuations: Secondary | ICD-10-CM | POA: Diagnosis not present

## 2023-04-05 DIAGNOSIS — R2689 Other abnormalities of gait and mobility: Secondary | ICD-10-CM | POA: Insufficient documentation

## 2023-04-05 NOTE — Therapy (Signed)
OUTPATIENT PHYSICAL THERAPY LOWER EXTREMITY TREATMENT   Patient Name: Morgan [name] MRN: [id number] DOB:October 29, 1950, 72 y.o., female Today's Date: 04/05/2023   PT End of Session - 04/05/23 1028     Visit Number 2    Number of Visits 24    Date for PT Re-Evaluation 06/13/23    Authorization Type progress note at visit 10    PT Start Time 1030    PT Stop Time 1108    PT Time Calculation (min) 38 min    Equipment Utilized During Treatment Gait belt    Activity Tolerance Patient tolerated treatment well    Behavior During Therapy WFL for tasks assessed/performed              Past Medical History:  Diagnosis Date   Anxiety    Fibromyalgia    Headache    Hypercholesteremia    controlled on medication   Hyperlipidemia    Hypertension    no current meds   LBBB (left bundle branch block)    Melanoma (HCC)    MVP (mitral valve prolapse)    Osteopenia    Parkinson disease    Pneumonia    SVD (spontaneous vaginal delivery)    x 2   Past Surgical History:  Procedure Laterality Date   BLEPHAROPLASTY     cold knife conization     COLONOSCOPY     FH colon CA   CYSTOSCOPY W/ URETERAL STENT PLACEMENT Left 10/18/2021   Procedure: CYSTOSCOPY WITH RETROGRADE PYELOGRAM, URETERAL STENT PLACEMENT;  Surgeon: Jerilee Field, MD;  Location: [facility];  Service: Urology;  Laterality: Left;   DILATATION & CURETTAGE/HYSTEROSCOPY WITH MYOSURE N/A 02/14/2018   Procedure: ATTEMPTED DILATATION & CURETTAGE/HYSTEROSCOPY WITH MYOSURE;  Surgeon: Gerald Leitz, MD;  Location: [facility];  Service: Gynecology;  Laterality: N/A;  polyp   melanoma cancer     removed from left leg   RECONSTRUCTION OF EYELID     TONSILLECTOMY     TUBAL LIGATION     URETEROSCOPY WITH HOLMIUM LASER LITHOTRIPSY Left 12/17/2021   Procedure: URETEROSCOPY/HOLMIUM LASER/STENT EXCHANGE;  Surgeon: Jerilee Field, MD;  Location: [facility];  Service: Urology;  Laterality: Left;   WISDOM TOOTH EXTRACTION     Patient Active  Problem List   Diagnosis Date Noted   COVID-19 virus infection 05/16/2022   HTN (hypertension) 10/20/2021   Physical deconditioning 10/20/2021   Hydronephrosis with infection 10/18/2021   Bacteremia due to Escherichia coli 10/18/2021   Severe sepsis (HCC) 10/17/2021   UTI (urinary tract infection) 10/17/2021   Acute metabolic encephalopathy 10/17/2021   Parkinson disease 10/17/2021   Cervical stenosis (uterine cervix) 02/14/2018   Postmenopausal bleeding 02/14/2018   LBBB (left bundle branch block) 12/25/2015   PVCs (premature ventricular contractions) 12/25/2015   Hyperlipidemia 12/25/2015   Fibromyalgia 01/16/2014   Cholelithiasis 04/25/2013   Paralysis agitans 01/15/2013   Unspecified hereditary and idiopathic peripheral neuropathy 01/15/2013   [name] MD PCP:   Cindi Carbon PROVIDER: Antoine Primas MD   REFERRING DIAG: Parkinsons   THERAPY DIAG:  Other abnormalities of gait and mobility  Parkinson's disease without dyskinesia or fluctuating manifestations  Rationale for Evaluation and Treatment Rehabilitation  ONSET DATE: Several years   SUBJECTIVE:   SUBJECTIVE STATEMENT: Pt reports she has not had any falls in 2 weeks.  She reports she feels tired today.   PERTINENT HISTORY: Fibromyalgia; headaches, LBBB; osteopenia   PAIN:  Are you having pain? no: NPRS scale:  Location:  Pain description:    Aggravating factors:  just comes and goes  Relieving factors: just comes and goes; sometimes ibuprofen works but she is limited as to how much she can take  PRECAUTIONS: Patient reports that because of her heart she will pass out at times.   WEIGHT BEARING RESTRICTIONS No  FALLS:  Has patient fallen in last 6 months? Yes.  6 falls but likley more. Last fall    LIVING ENVIRONMENT: Stairs to the second floor in her house but she dosen't do much  4 steps in the front  2 steps in the garage  OCCUPATION: retired   Presenter, broadcasting: reading; goes to an exercise class    PLOF: Independent with household mobility with device  PATIENT GOALS  To have less pain in her legs and to move better    OBJECTIVE:  (measurements taken at evaluation unless otherwise noted)   DIAGNOSTIC FINDINGS:   PATIENT SURVEYS:  FOTO    COGNITION:  Overall cognitive status: Within functional limits for tasks assessed     SENSATION: Denies paresthesias   POSTURE: No Significant postural limitations  PALPATION: No trigger points noted   LOWER EXTREMITY ROM:  Passive ROM Right eval Left eval  Hip flexion    Hip extension    Hip abduction    Hip adduction    Hip internal rotation    Hip external rotation    Knee flexion    Knee extension    Ankle dorsiflexion    Ankle plantarflexion    Ankle inversion    Ankle eversion     (Blank rows = not tested)  WNL  LOWER EXTREMITY MMT:  MMT Right eval Left eval Right  7/22 Left  7/2  Hip flexion 21.7 29.7 23.5 25.9  Hip extension      Hip abduction 27.6 18.5 19.1 8.7  Hip adduction      Hip internal rotation      Hip external rotation      Knee flexion      Knee extension 23.5 20.2 17.7 16.8  Ankle dorsiflexion      Ankle plantarflexion      Ankle inversion      Ankle eversion       (Blank rows = not tested)  GAIT: Decreased hip flexion; leans to the left; right hip frop. Patient required CGA while walking and min a when she turned for balance. No change from IE   Functional tests:  Sit to stand: cuing to catch balance. Uses momentum to stand.    5x sit to stand Balance: 13 sec     Narrow base min a  Narrow base eyes closed min to mod a  Tandem stance Min a both ways.  Single leg stance 5 sec bilateral    BERG Balance Test          Date:   Sit to Stand 1  Standing unsupported 3  Sitting with back unsupported but feet supported 1  Stand to sit  1  Transfers  2  Standing unsupported with eyes closed 3  Standing unsupported feet together 3  From standing position, reach forward  with outstretched arm 1  From standing position, pick up object from floor 1  From standing position, turn and look behind over each shoulder 1  Turn 360 2  Standing unsupported, alternately place foot on step 1  Standing unsupported, one foot in front 1  Standing on one leg 0  Total:    21/30    TODAY'S TREATMENT: 04/05/23: Therapeutic exercise: NuStep (seat8,  arms 9) L3 x 5 min  STS from NuStep seat x 10 with SBA, cues for forward wt shift and slow controlled descent Seated:  bicep curls 2 sets x 10 3#; overhead press 2#, 2 sets of 10 Balance:  On Airex by counter:  marching,  narrow BOS with horiz head turns, narrow BOS eyes closed By counter:  wide BOS with reaching cross midline to shelf above eye level for target - 9 reps; tandem gait with light touch RUE on counter forward/backward 6 ft x 4 reps    PATIENT EDUCATION:  Education details: exercise rationale and technique   Person educated: Patient Education method: Explanation, Demonstration, Tactile cues, Verbal cues, Education comprehension: verbalized understanding, returned demonstration, verbal cues required, tactile cues required, and needs further education   HOME EXERCISE PROGRAM:  has a program already   ASSESSMENT:  CLINICAL IMPRESSION: Pt tolerated exercise well, without increase in pain.  She is given short seated rest breaks throughout session due to fatigue.  She requires SBA and occasional CGA to steady, for balance exercises. STS continues to be a challenge; utilizes back of legs for upward propulsion and has difficulty coming fully upright without use of hands on standard chair.   PT to perform a DGI next visit. Pt would benefit from skilled therapy to improve overall mobility and improve her ability to perform exercises.  Goals are ongoing.   OBJECTIVE IMPAIRMENTS Abnormal gait, decreased activity tolerance, decreased balance, decreased mobility, difficulty walking, decreased strength, impaired tone, and  pain.   ACTIVITY LIMITATIONS carrying, lifting, bending, standing, squatting, stairs, transfers, bed mobility, dressing, and locomotion level  PARTICIPATION LIMITATIONS: meal prep, cleaning, laundry, driving, shopping, community activity, and yard work  PERSONAL FACTORS Fibromyalgia; headaches, LBBB; osteopenia  are also affecting patient's functional outcome.   REHAB POTENTIAL: Good  CLINICAL DECISION MAKING: Evolving/moderate complexity increasing pain in her quads   EVALUATION COMPLEXITY: Moderate   GOALS: Goals reviewed with patient? Yes  SHORT TERM GOALS: Target date: 04/18/2023  Patient will transfer sit to stand smoothly and independently  Baseline: Goal status: INITIAL  2.  Patient will increase gross strength by 5lbs  Baseline:  Goal status: INITIAL  3.  Patient will improve BERG score by 10  Baseline:  Goal status: INITIAL LONG TERM GOALS: Target date: 05/16/2023   Patient will go improve balance to CGA for all balance exercises to improve safety  Baseline:  Goal status: INITIAL  2.  Patient will report a 50% reduction in falls  Baseline:  Goal status: INITIAL  3.  Patient will have a full balance program  Baseline:  Goal status: INITIAL   PLAN: PT FREQUENCY: 2x/week  PT DURATION: 8 weeks  PLANNED INTERVENTIONS: Therapeutic exercises, Therapeutic activity, Neuromuscular re-education, Balance training, Gait training, Patient/Family education, Self Care, Joint mobilization, Stair training, Aquatic Therapy, Cryotherapy, Moist heat, Taping, Manual therapy, and Re-evaluation  PLAN FOR NEXT SESSION: work on improving sit to stand, posterior, dynamic, and static balance. Work on general conditioning. Be aware of leg pain on any given day.    Mayer Camel, PTA 04/05/23 11:21 AM [facility] [address] [place], [place], [address] Phone: [phone]   Fax:  [phone]

## 2023-04-10 ENCOUNTER — Encounter (HOSPITAL_BASED_OUTPATIENT_CLINIC_OR_DEPARTMENT_OTHER): Payer: Self-pay | Admitting: Physical Therapy

## 2023-04-10 ENCOUNTER — Ambulatory Visit (HOSPITAL_BASED_OUTPATIENT_CLINIC_OR_DEPARTMENT_OTHER): Payer: Medicare Other | Admitting: Physical Therapy

## 2023-04-10 DIAGNOSIS — R413 Other amnesia: Secondary | ICD-10-CM | POA: Diagnosis not present

## 2023-04-10 DIAGNOSIS — R2689 Other abnormalities of gait and mobility: Secondary | ICD-10-CM

## 2023-04-10 DIAGNOSIS — G20A1 Parkinson's disease without dyskinesia, without mention of fluctuations: Secondary | ICD-10-CM

## 2023-04-10 DIAGNOSIS — G20B2 Parkinson's disease with dyskinesia, with fluctuations: Secondary | ICD-10-CM | POA: Diagnosis not present

## 2023-04-10 NOTE — Therapy (Signed)
OUTPATIENT PHYSICAL THERAPY LOWER EXTREMITY TREATMENT   Patient Name: Morgan [name] MRN: [id number] DOB:[birth year], 72 y.o., female Today's Date: 04/10/2023   PT End of Session - 04/10/23 1154     Visit Number 3    Number of Visits 24    Date for PT Re-Evaluation 06/13/23    Authorization Type progress note at visit 10    PT Start Time 1145    PT Stop Time 1228    PT Time Calculation (min) 43 min    Activity Tolerance Patient tolerated treatment well    Behavior During Therapy WFL for tasks assessed/performed              Past Medical History:  Diagnosis Date   Anxiety    Fibromyalgia    Headache    Hypercholesteremia    controlled on medication   Hyperlipidemia    Hypertension    no current meds   LBBB (left bundle branch block)    Melanoma (HCC)    MVP (mitral valve prolapse)    Osteopenia    Parkinson disease    Pneumonia    SVD (spontaneous vaginal delivery)    x 2   Past Surgical History:  Procedure Laterality Date   BLEPHAROPLASTY     cold knife conization     COLONOSCOPY     FH colon CA   CYSTOSCOPY W/ URETERAL STENT PLACEMENT Left 10/18/2021   Procedure: CYSTOSCOPY WITH RETROGRADE PYELOGRAM, URETERAL STENT PLACEMENT;  Surgeon: Jerilee Field, MD;  Location: [facility];  Service: Urology;  Laterality: Left;   DILATATION & CURETTAGE/HYSTEROSCOPY WITH MYOSURE N/A 02/14/2018   Procedure: ATTEMPTED DILATATION & CURETTAGE/HYSTEROSCOPY WITH MYOSURE;  Surgeon: Gerald Leitz, MD;  Location: [facility];  Service: Gynecology;  Laterality: N/A;  polyp   melanoma cancer     removed from left leg   RECONSTRUCTION OF EYELID     TONSILLECTOMY     TUBAL LIGATION     URETEROSCOPY WITH HOLMIUM LASER LITHOTRIPSY Left 12/17/2021   Procedure: URETEROSCOPY/HOLMIUM LASER/STENT EXCHANGE;  Surgeon: Jerilee Field, MD;  Location: [facility];  Service: Urology;  Laterality: Left;   WISDOM TOOTH EXTRACTION     Patient Active Problem List   Diagnosis Date Noted   COVID-19 virus  infection 05/16/2022   HTN (hypertension) 10/20/2021   Physical deconditioning 10/20/2021   Hydronephrosis with infection 10/18/2021   Bacteremia due to Escherichia coli 10/18/2021   Severe sepsis (HCC) 10/17/2021   UTI (urinary tract infection) 10/17/2021   Acute metabolic encephalopathy 10/17/2021   Parkinson disease 10/17/2021   Cervical stenosis (uterine cervix) 02/14/2018   Postmenopausal bleeding 02/14/2018   LBBB (left bundle branch block) 12/25/2015   PVCs (premature ventricular contractions) 12/25/2015   Hyperlipidemia 12/25/2015   Fibromyalgia 01/16/2014   Cholelithiasis 04/25/2013   Paralysis agitans 01/15/2013   Unspecified hereditary and idiopathic peripheral neuropathy 01/15/2013   [name] MD PCP:   Cindi Carbon PROVIDER: Antoine Primas MD   REFERRING DIAG: Parkinsons   THERAPY DIAG:  Other abnormalities of gait and mobility  Parkinson's disease without dyskinesia or fluctuating manifestations  Rationale for Evaluation and Treatment Rehabilitation  ONSET DATE: Several years   SUBJECTIVE:   SUBJECTIVE STATEMENT: The patient fell yesterday>S he reports she just turned and lost her balance. She is still using just a stick. She reports she took too much of her medication  PERTINENT HISTORY: Fibromyalgia; headaches, LBBB; osteopenia   PAIN:  Are you having pain? no: NPRS scale:  Location:  Pain description:    Aggravating  factors: just comes and goes  Relieving factors: just comes and goes; sometimes ibuprofen works but she is limited as to how much she can take  PRECAUTIONS: Patient reports that because of her heart she will pass out at times.   WEIGHT BEARING RESTRICTIONS No  FALLS:  Has patient fallen in last 6 months? Yes.  6 falls but likley more. Last fall    LIVING ENVIRONMENT: Stairs to the second floor in her house but she dosen't do much  4 steps in the front  2 steps in the garage  OCCUPATION: retired   Presenter, broadcasting: reading; goes to an  exercise class   PLOF: Independent with household mobility with device  PATIENT GOALS  To have less pain in her legs and to move better    OBJECTIVE:  (measurements taken at evaluation unless otherwise noted)   DIAGNOSTIC FINDINGS:   PATIENT SURVEYS:  FOTO    COGNITION:  Overall cognitive status: Within functional limits for tasks assessed     SENSATION: Denies paresthesias   POSTURE: No Significant postural limitations  PALPATION: No trigger points noted   LOWER EXTREMITY ROM:  Passive ROM Right eval Left eval  Hip flexion    Hip extension    Hip abduction    Hip adduction    Hip internal rotation    Hip external rotation    Knee flexion    Knee extension    Ankle dorsiflexion    Ankle plantarflexion    Ankle inversion    Ankle eversion     (Blank rows = not tested)  WNL  LOWER EXTREMITY MMT:  MMT Right eval Left eval Right  7/22 Left  7/2  Hip flexion 21.7 29.7 23.5 25.9  Hip extension      Hip abduction 27.6 18.5 19.1 8.7  Hip adduction      Hip internal rotation      Hip external rotation      Knee flexion      Knee extension 23.5 20.2 17.7 16.8  Ankle dorsiflexion      Ankle plantarflexion      Ankle inversion      Ankle eversion       (Blank rows = not tested)  GAIT: Decreased hip flexion; leans to the left; right hip frop. Patient required CGA while walking and min a when she turned for balance. No change from IE   Functional tests:  Sit to stand: cuing to catch balance. Uses momentum to stand.    5x sit to stand Balance: 13 sec     Narrow base min a  Narrow base eyes closed min to mod a  Tandem stance Min a both ways.  Single leg stance 5 sec bilateral    BERG Balance Test          Date:   Sit to Stand 1  Standing unsupported 3  Sitting with back unsupported but feet supported 1  Stand to sit  1  Transfers  2  Standing unsupported with eyes closed 3  Standing unsupported feet together 3  From standing position,  reach forward with outstretched arm 1  From standing position, pick up object from floor 1  From standing position, turn and look behind over each shoulder 1  Turn 360 2  Standing unsupported, alternately place foot on step 1  Standing unsupported, one foot in front 1  Standing on one leg 0  Total:    21/30    TODAY'S TREATMENT: 8/12 Nu-step 5 min  Sit to stand 3x3  improved tehcnoque with each trial. Posteiror loss of balance noted SBA - Max a   Air-ex rock: mod a 1 min  Air-ex normal base 3x30 sec CGA -> mod A   Coordination:   2 steps fwd 2 steps back 2x10 3 SF 3 SB 2x10   2 side steps 2x10   Seated balance:   Double arm press 1lb 2x10  Single arm press alt 2x10  Leg kicks with cuing to keep it slow 2x10   Hurdle tep over x10       04/05/23: Therapeutic exercise: NuStep (seat8, arms 9) L3 x 5 min  STS from NuStep seat x 10 with SBA, cues for forward wt shift and slow controlled descent Seated:  bicep curls 2 sets x 10 3#; overhead press 2#, 2 sets of 10 Balance:  On Airex by counter:  marching,  narrow BOS with horiz head turns, narrow BOS eyes closed By counter:  wide BOS with reaching cross midline to shelf above eye level for target - 9 reps; tandem gait with light touch RUE on counter forward/backward 6 ft x 4 reps    PATIENT EDUCATION:  Education details: exercise rationale and technique   Person educated: Patient Education method: Explanation, Demonstration, Tactile cues, Verbal cues, Education comprehension: verbalized understanding, returned demonstration, verbal cues required, tactile cues required, and needs further education   HOME EXERCISE PROGRAM:  has a program already   ASSESSMENT:  CLINICAL IMPRESSION: Despite basle ine issues with her balance because of her meds she did well. She required anywhere from CGA to max a for her balance exercises. She improves with practice. She was advised at this time she needs to start using her walker  at home. She has had several falls. It is not clear if she will.  OBJECTIVE IMPAIRMENTS Abnormal gait, decreased activity tolerance, decreased balance, decreased mobility, difficulty walking, decreased strength, impaired tone, and pain.   ACTIVITY LIMITATIONS carrying, lifting, bending, standing, squatting, stairs, transfers, bed mobility, dressing, and locomotion level  PARTICIPATION LIMITATIONS: meal prep, cleaning, laundry, driving, shopping, community activity, and yard work  PERSONAL FACTORS Fibromyalgia; headaches, LBBB; osteopenia  are also affecting patient's functional outcome.   REHAB POTENTIAL: Good  CLINICAL DECISION MAKING: Evolving/moderate complexity increasing pain in her quads   EVALUATION COMPLEXITY: Moderate   GOALS: Goals reviewed with patient? Yes  SHORT TERM GOALS: Target date: 04/18/2023  Patient will transfer sit to stand smoothly and independently  Baseline: Goal status: INITIAL  2.  Patient will increase gross strength by 5lbs  Baseline:  Goal status: INITIAL  3.  Patient will improve BERG score by 10  Baseline:  Goal status: INITIAL LONG TERM GOALS: Target date: 05/16/2023   Patient will go improve balance to CGA for all balance exercises to improve safety  Baseline:  Goal status: INITIAL  2.  Patient will report a 50% reduction in falls  Baseline:  Goal status: INITIAL  3.  Patient will have a full balance program  Baseline:  Goal status: INITIAL   PLAN: PT FREQUENCY: 2x/week  PT DURATION: 8 weeks  PLANNED INTERVENTIONS: Therapeutic exercises, Therapeutic activity, Neuromuscular re-education, Balance training, Gait training, Patient/Family education, Self Care, Joint mobilization, Stair training, Aquatic Therapy, Cryotherapy, Moist heat, Taping, Manual therapy, and Re-evaluation  PLAN FOR NEXT SESSION: work on improving sit to stand, posterior, dynamic, and static balance. Work on general conditioning. Be aware of leg pain on any given  day.     Lorayne Bender PT DPT  04/10/23 1:27 PM [facility] [address] [place], [place], [address] Phone: [phone]   Fax:  [phone]

## 2023-04-17 ENCOUNTER — Ambulatory Visit (HOSPITAL_BASED_OUTPATIENT_CLINIC_OR_DEPARTMENT_OTHER): Payer: Medicare Other | Admitting: Physical Therapy

## 2023-04-17 ENCOUNTER — Telehealth (HOSPITAL_BASED_OUTPATIENT_CLINIC_OR_DEPARTMENT_OTHER): Payer: Self-pay | Admitting: Physical Therapy

## 2023-04-17 DIAGNOSIS — K8020 Calculus of gallbladder without cholecystitis without obstruction: Secondary | ICD-10-CM | POA: Diagnosis not present

## 2023-04-17 DIAGNOSIS — K5730 Diverticulosis of large intestine without perforation or abscess without bleeding: Secondary | ICD-10-CM | POA: Diagnosis not present

## 2023-04-17 DIAGNOSIS — N200 Calculus of kidney: Secondary | ICD-10-CM | POA: Diagnosis not present

## 2023-04-17 DIAGNOSIS — K449 Diaphragmatic hernia without obstruction or gangrene: Secondary | ICD-10-CM | POA: Diagnosis not present

## 2023-04-17 NOTE — Telephone Encounter (Signed)
Called patient regarding no-show appointment. Patient advised of their next appointment time.

## 2023-04-18 ENCOUNTER — Ambulatory Visit: Payer: Medicare Other | Admitting: Neurology

## 2023-04-20 ENCOUNTER — Ambulatory Visit (HOSPITAL_BASED_OUTPATIENT_CLINIC_OR_DEPARTMENT_OTHER): Payer: Medicare Other | Admitting: Physical Therapy

## 2023-04-20 ENCOUNTER — Encounter (HOSPITAL_BASED_OUTPATIENT_CLINIC_OR_DEPARTMENT_OTHER): Payer: Self-pay | Admitting: Physical Therapy

## 2023-04-20 DIAGNOSIS — R2689 Other abnormalities of gait and mobility: Secondary | ICD-10-CM | POA: Diagnosis not present

## 2023-04-20 DIAGNOSIS — G20A1 Parkinson's disease without dyskinesia, without mention of fluctuations: Secondary | ICD-10-CM

## 2023-04-20 NOTE — Therapy (Signed)
OUTPATIENT PHYSICAL THERAPY LOWER EXTREMITY TREATMENT   Patient Name: Morgan [name] MRN: [id number] DOB:Jan 11, 1951, 72 y.o., female Today's Date: 04/20/2023   PT End of Session - 04/20/23 1211     Visit Number 4    Date for PT Re-Evaluation 06/13/23    Authorization Type progress note at visit 10    PT Start Time 1150    PT Stop Time 1230    PT Time Calculation (min) 40 min    Activity Tolerance Patient tolerated treatment well    Behavior During Therapy WFL for tasks assessed/performed              Past Medical History:  Diagnosis Date   Anxiety    Fibromyalgia    Headache    Hypercholesteremia    controlled on medication   Hyperlipidemia    Hypertension    no current meds   LBBB (left bundle branch block)    Melanoma (HCC)    MVP (mitral valve prolapse)    Osteopenia    Parkinson disease    Pneumonia    SVD (spontaneous vaginal delivery)    x 2   Past Surgical History:  Procedure Laterality Date   BLEPHAROPLASTY     cold knife conization     COLONOSCOPY     FH colon CA   CYSTOSCOPY W/ URETERAL STENT PLACEMENT Left 10/18/2021   Procedure: CYSTOSCOPY WITH RETROGRADE PYELOGRAM, URETERAL STENT PLACEMENT;  Surgeon: Jerilee Field, MD;  Location: [facility];  Service: Urology;  Laterality: Left;   DILATATION & CURETTAGE/HYSTEROSCOPY WITH MYOSURE N/A 02/14/2018   Procedure: ATTEMPTED DILATATION & CURETTAGE/HYSTEROSCOPY WITH MYOSURE;  Surgeon: Gerald Leitz, MD;  Location: [facility];  Service: Gynecology;  Laterality: N/A;  polyp   melanoma cancer     removed from left leg   RECONSTRUCTION OF EYELID     TONSILLECTOMY     TUBAL LIGATION     URETEROSCOPY WITH HOLMIUM LASER LITHOTRIPSY Left 12/17/2021   Procedure: URETEROSCOPY/HOLMIUM LASER/STENT EXCHANGE;  Surgeon: Jerilee Field, MD;  Location: [facility];  Service: Urology;  Laterality: Left;   WISDOM TOOTH EXTRACTION     Patient Active Problem List   Diagnosis Date Noted   COVID-19 virus infection 05/16/2022    HTN (hypertension) 10/20/2021   Physical deconditioning 10/20/2021   Hydronephrosis with infection 10/18/2021   Bacteremia due to Escherichia coli 10/18/2021   Severe sepsis (HCC) 10/17/2021   UTI (urinary tract infection) 10/17/2021   Acute metabolic encephalopathy 10/17/2021   Parkinson disease 10/17/2021   Cervical stenosis (uterine cervix) 02/14/2018   Postmenopausal bleeding 02/14/2018   LBBB (left bundle branch block) 12/25/2015   PVCs (premature ventricular contractions) 12/25/2015   Hyperlipidemia 12/25/2015   Fibromyalgia 01/16/2014   Cholelithiasis 04/25/2013   Paralysis agitans 01/15/2013   Unspecified hereditary and idiopathic peripheral neuropathy 01/15/2013   [name] MD PCP:   REFERRING PROVIDER: Antoine Primas MD   REFERRING DIAG: Parkinsons   THERAPY DIAG:  Other abnormalities of gait and mobility  Parkinson's disease without dyskinesia or fluctuating manifestations  Rationale for Evaluation and Treatment Rehabilitation  ONSET DATE: Several years   SUBJECTIVE:   SUBJECTIVE STATEMENT: Patient's husband reports they are working on dialing in on her medication levels when she comes to therapy.  He reports that today he feels like her balance is off because of her medication levels.  They changed her to a more of a slow release type medication.  She reports no falls since last visit  PERTINENT HISTORY: Fibromyalgia; headaches, LBBB; osteopenia  PAIN:  Are you having pain? no: NPRS scale:  Location:  Pain description:    Aggravating factors: just comes and goes  Relieving factors: just comes and goes; sometimes ibuprofen works but she is limited as to how much she can take  PRECAUTIONS: Patient reports that because of her heart she will pass out at times.   WEIGHT BEARING RESTRICTIONS No  FALLS:  Has patient fallen in last 6 months? Yes.  6 falls but likley more. Last fall    LIVING ENVIRONMENT: Stairs to the second floor in her house but she  dosen't do much  4 steps in the front  2 steps in the garage  OCCUPATION: retired   Presenter, broadcasting: reading; goes to an exercise class   PLOF: Independent with household mobility with device  PATIENT GOALS  To have less pain in her legs and to move better    OBJECTIVE:  (measurements taken at evaluation unless otherwise noted)   DIAGNOSTIC FINDINGS:   PATIENT SURVEYS:  FOTO    COGNITION:  Overall cognitive status: Within functional limits for tasks assessed     SENSATION: Denies paresthesias   POSTURE: No Significant postural limitations  PALPATION: No trigger points noted   LOWER EXTREMITY ROM:  Passive ROM Right eval Left eval  Hip flexion    Hip extension    Hip abduction    Hip adduction    Hip internal rotation    Hip external rotation    Knee flexion    Knee extension    Ankle dorsiflexion    Ankle plantarflexion    Ankle inversion    Ankle eversion     (Blank rows = not tested)  WNL  LOWER EXTREMITY MMT:  MMT Right eval Left eval Right  7/22 Left  7/2  Hip flexion 21.7 29.7 23.5 25.9  Hip extension      Hip abduction 27.6 18.5 19.1 8.7  Hip adduction      Hip internal rotation      Hip external rotation      Knee flexion      Knee extension 23.5 20.2 17.7 16.8  Ankle dorsiflexion      Ankle plantarflexion      Ankle inversion      Ankle eversion       (Blank rows = not tested)  GAIT: Decreased hip flexion; leans to the left; right hip frop. Patient required CGA while walking and min a when she turned for balance. No change from IE   Functional tests:  Sit to stand: cuing to catch balance. Uses momentum to stand.    5x sit to stand Balance: 13 sec     Narrow base min a  Narrow base eyes closed min to mod a  Tandem stance Min a both ways.  Single leg stance 5 sec bilateral    BERG Balance Test          Date:   Sit to Stand 1  Standing unsupported 3  Sitting with back unsupported but feet supported 1  Stand to sit  1   Transfers  2  Standing unsupported with eyes closed 3  Standing unsupported feet together 3  From standing position, reach forward with outstretched arm 1  From standing position, pick up object from floor 1  From standing position, turn and look behind over each shoulder 1  Turn 360 2  Standing unsupported, alternately place foot on step 1  Standing unsupported, one foot in front 1  Standing on  one leg 0  Total:    21/30    TODAY'S TREATMENT: 8/22 NuStep 5 minutes  Air-ex rock: mod a 1 min  Air-ex normal base 3x30 sec CGA -> mod A Marching on Airex 2 x 10 some difficulty lifting her legs  Hurdles 3 laps 6 4 hurdles side-to-side Step over hurdle and back 2 x 10  Patient required min to mod assist for all sit to stand transfers today.  Bicep curls 2 x 10 1 pounds D2 shoulder flexion 2 x 10 1 pound Punches 2 x 10 1 pound  LAQ 2 x 10  8/12 Nu-step 5 min   Sit to stand 3x3  improved tehcnoque with each trial. Posteiror loss of balance noted SBA - Max a   Air-ex rock: mod a 1 min  Air-ex normal base 3x30 sec CGA -> mod A   Coordination:   2 steps fwd 2 steps back 2x10 3 SF 3 SB 2x10   2 side steps 2x10   Seated balance:   Double arm press 1lb 2x10  Single arm press alt 2x10  Leg kicks with cuing to keep it slow 2x10   Hurdle tep over x10       04/05/23: Therapeutic exercise: NuStep (seat8, arms 9) L3 x 5 min  STS from NuStep seat x 10 with SBA, cues for forward wt shift and slow controlled descent Seated:  bicep curls 2 sets x 10 3#; overhead press 2#, 2 sets of 10 Balance:  On Airex by counter:  marching,  narrow BOS with horiz head turns, narrow BOS eyes closed By counter:  wide BOS with reaching cross midline to shelf above eye level for target - 9 reps; tandem gait with light touch RUE on counter forward/backward 6 ft x 4 reps    PATIENT EDUCATION:  Education details: exercise rationale and technique   Person educated: Patient Education  method: Explanation, Demonstration, Tactile cues, Verbal cues, Education comprehension: verbalized understanding, returned demonstration, verbal cues required, tactile cues required, and needs further education   HOME EXERCISE PROGRAM:  has a program already   ASSESSMENT:  CLINICAL IMPRESSION: The patient required increase assist to transfer today.  She has some difficulty lifting her legs with marching exercises.  She required increased assist for balance.  She had a posterior loss of balance with good balance activity she was able to be corrected with min to mod assist.  Therapy will continue to progress as tolerated.  Her husband is aware of her balance today.  OBJECTIVE IMPAIRMENTS Abnormal gait, decreased activity tolerance, decreased balance, decreased mobility, difficulty walking, decreased strength, impaired tone, and pain.   ACTIVITY LIMITATIONS carrying, lifting, bending, standing, squatting, stairs, transfers, bed mobility, dressing, and locomotion level  PARTICIPATION LIMITATIONS: meal prep, cleaning, laundry, driving, shopping, community activity, and yard work  PERSONAL FACTORS Fibromyalgia; headaches, LBBB; osteopenia  are also affecting patient's functional outcome.   REHAB POTENTIAL: Good  CLINICAL DECISION MAKING: Evolving/moderate complexity increasing pain in her quads   EVALUATION COMPLEXITY: Moderate   GOALS: Goals reviewed with patient? Yes  SHORT TERM GOALS: Target date: 04/18/2023  Patient will transfer sit to stand smoothly and independently  Baseline: Goal status: INITIAL  2.  Patient will increase gross strength by 5lbs  Baseline:  Goal status: INITIAL  3.  Patient will improve BERG score by 10  Baseline:  Goal status: INITIAL LONG TERM GOALS: Target date: 05/16/2023   Patient will go improve balance to CGA for all balance exercises to improve safety  Baseline:  Goal status: INITIAL  2.  Patient will report a 50% reduction in falls  Baseline:   Goal status: INITIAL  3.  Patient will have a full balance program  Baseline:  Goal status: INITIAL   PLAN: PT FREQUENCY: 2x/week  PT DURATION: 8 weeks  PLANNED INTERVENTIONS: Therapeutic exercises, Therapeutic activity, Neuromuscular re-education, Balance training, Gait training, Patient/Family education, Self Care, Joint mobilization, Stair training, Aquatic Therapy, Cryotherapy, Moist heat, Taping, Manual therapy, and Re-evaluation  PLAN FOR NEXT SESSION: work on improving sit to stand, posterior, dynamic, and static balance. Work on general conditioning. Be aware of leg pain on any given day.     Lorayne Bender PT DPT 04/20/23 12:17 PM [facility] [address] [place], [place], [address] Phone: [phone]   Fax:  [phone]

## 2023-04-24 ENCOUNTER — Encounter (HOSPITAL_BASED_OUTPATIENT_CLINIC_OR_DEPARTMENT_OTHER): Payer: Self-pay | Admitting: Physical Therapy

## 2023-04-24 ENCOUNTER — Ambulatory Visit (HOSPITAL_BASED_OUTPATIENT_CLINIC_OR_DEPARTMENT_OTHER): Payer: Medicare Other | Admitting: Physical Therapy

## 2023-04-24 DIAGNOSIS — G20A1 Parkinson's disease without dyskinesia, without mention of fluctuations: Secondary | ICD-10-CM | POA: Diagnosis not present

## 2023-04-24 DIAGNOSIS — R2689 Other abnormalities of gait and mobility: Secondary | ICD-10-CM | POA: Diagnosis not present

## 2023-04-24 NOTE — Therapy (Signed)
OUTPATIENT PHYSICAL THERAPY LOWER EXTREMITY TREATMENT   Patient Name: Morgan [name] MRN: [id number] DOB:02/22/1951, 72 y.o., female Today's Date: 04/24/2023   PT End of Session - 04/24/23 1108     Visit Number 5    Number of Visits 24    Date for PT Re-Evaluation 06/13/23    Authorization Type progress note at visit 10    PT Start Time 1100    PT Stop Time 1143    PT Time Calculation (min) 43 min    Activity Tolerance Patient tolerated treatment well    Behavior During Therapy WFL for tasks assessed/performed              Past Medical History:  Diagnosis Date   Anxiety    Fibromyalgia    Headache    Hypercholesteremia    controlled on medication   Hyperlipidemia    Hypertension    no current meds   LBBB (left bundle branch block)    Melanoma (HCC)    MVP (mitral valve prolapse)    Osteopenia    Parkinson disease    Pneumonia    SVD (spontaneous vaginal delivery)    x 2   Past Surgical History:  Procedure Laterality Date   BLEPHAROPLASTY     cold knife conization     COLONOSCOPY     FH colon CA   CYSTOSCOPY W/ URETERAL STENT PLACEMENT Left 10/18/2021   Procedure: CYSTOSCOPY WITH RETROGRADE PYELOGRAM, URETERAL STENT PLACEMENT;  Surgeon: Jerilee Field, MD;  Location: [facility];  Service: Urology;  Laterality: Left;   DILATATION & CURETTAGE/HYSTEROSCOPY WITH MYOSURE N/A 02/14/2018   Procedure: ATTEMPTED DILATATION & CURETTAGE/HYSTEROSCOPY WITH MYOSURE;  Surgeon: Gerald Leitz, MD;  Location: [facility];  Service: Gynecology;  Laterality: N/A;  polyp   melanoma cancer     removed from left leg   RECONSTRUCTION OF EYELID     TONSILLECTOMY     TUBAL LIGATION     URETEROSCOPY WITH HOLMIUM LASER LITHOTRIPSY Left 12/17/2021   Procedure: URETEROSCOPY/HOLMIUM LASER/STENT EXCHANGE;  Surgeon: Jerilee Field, MD;  Location: [facility];  Service: Urology;  Laterality: Left;   WISDOM TOOTH EXTRACTION     Patient Active Problem List   Diagnosis Date Noted   COVID-19 virus  infection 05/16/2022   HTN (hypertension) 10/20/2021   Physical deconditioning 10/20/2021   Hydronephrosis with infection 10/18/2021   Bacteremia due to Escherichia coli 10/18/2021   Severe sepsis (HCC) 10/17/2021   UTI (urinary tract infection) 10/17/2021   Acute metabolic encephalopathy 10/17/2021   Parkinson disease 10/17/2021   Cervical stenosis (uterine cervix) 02/14/2018   Postmenopausal bleeding 02/14/2018   LBBB (left bundle branch block) 12/25/2015   PVCs (premature ventricular contractions) 12/25/2015   Hyperlipidemia 12/25/2015   Fibromyalgia 01/16/2014   Cholelithiasis 04/25/2013   Paralysis agitans 01/15/2013   Unspecified hereditary and idiopathic peripheral neuropathy 01/15/2013   [name] MD PCP:   Cindi Carbon PROVIDER: Antoine Primas MD   REFERRING DIAG: Parkinsons   THERAPY DIAG:  No diagnosis found.  Rationale for Evaluation and Treatment Rehabilitation  ONSET DATE: Several years   SUBJECTIVE:   SUBJECTIVE STATEMENT: The patient is doing better today. She reports no pain in her legs. Her balance has been a little better. She comes in today without an AD which is not advised but she has her caregiver with her.  PERTINENT HISTORY: Fibromyalgia; headaches, LBBB; osteopenia   PAIN:  Are you having pain? no: NPRS scale:  Location:  Pain description:    Aggravating factors: just  comes and goes  Relieving factors: just comes and goes; sometimes ibuprofen works but she is limited as to how much she can take  PRECAUTIONS: Patient reports that because of her heart she will pass out at times.   WEIGHT BEARING RESTRICTIONS No  FALLS:  Has patient fallen in last 6 months? Yes.  6 falls but likley more. Last fall    LIVING ENVIRONMENT: Stairs to the second floor in her house but she dosen't do much  4 steps in the front  2 steps in the garage  OCCUPATION: retired   Presenter, broadcasting: reading; goes to an exercise class   PLOF: Independent with household  mobility with device  PATIENT GOALS  To have less pain in her legs and to move better    OBJECTIVE:  (measurements taken at evaluation unless otherwise noted)   DIAGNOSTIC FINDINGS:   PATIENT SURVEYS:  FOTO    COGNITION:  Overall cognitive status: Within functional limits for tasks assessed     SENSATION: Denies paresthesias   POSTURE: No Significant postural limitations  PALPATION: No trigger points noted   LOWER EXTREMITY ROM:  Passive ROM Right eval Left eval  Hip flexion    Hip extension    Hip abduction    Hip adduction    Hip internal rotation    Hip external rotation    Knee flexion    Knee extension    Ankle dorsiflexion    Ankle plantarflexion    Ankle inversion    Ankle eversion     (Blank rows = not tested)  WNL  LOWER EXTREMITY MMT:  MMT Right eval Left eval Right  7/22 Left  7/2  Hip flexion 21.7 29.7 23.5 25.9  Hip extension      Hip abduction 27.6 18.5 19.1 8.7  Hip adduction      Hip internal rotation      Hip external rotation      Knee flexion      Knee extension 23.5 20.2 17.7 16.8  Ankle dorsiflexion      Ankle plantarflexion      Ankle inversion      Ankle eversion       (Blank rows = not tested)  GAIT: Decreased hip flexion; leans to the left; right hip frop. Patient required CGA while walking and min a when she turned for balance. No change from IE   Functional tests:  Sit to stand: cuing to catch balance. Uses momentum to stand.    5x sit to stand Balance: 13 sec     Narrow base min a  Narrow base eyes closed min to mod a  Tandem stance Min a both ways.  Single leg stance 5 sec bilateral    BERG Balance Test          Date:   Sit to Stand 1  Standing unsupported 3  Sitting with back unsupported but feet supported 1  Stand to sit  1  Transfers  2  Standing unsupported with eyes closed 3  Standing unsupported feet together 3  From standing position, reach forward with outstretched arm 1  From standing  position, pick up object from floor 1  From standing position, turn and look behind over each shoulder 1  Turn 360 2  Standing unsupported, alternately place foot on step 1  Standing unsupported, one foot in front 1  Standing on one leg 0  Total:    21/30    TODAY'S TREATMENT: 8/26 NuStep 5 minutes  Air-ex  rock: mod a 1 min  Air-ex normal base 3x30 sec CGA -> mod A Marching on Airex 2 x 10 some difficulty lifting her legs  Hurdles 3 laps 6 4 hurdles side-to-side  Step up 2x10 4 inch  Lateral step up 2x10 4 inch   Sit to stand transfers 3x4 after the first set we raised the table. She did better with posterior balance and controlled decent. Cuing for controlled decent.   8/22 NuStep 5 minutes  Air-ex rock: mod a 1 min  Air-ex normal base 3x30 sec CGA -> mod A Marching on Airex 2 x 10 some difficulty lifting her legs  Hurdles 3 laps 6 4 hurdles side-to-side Step over hurdle and back 2 x 10  Patient required min to mod assist for all sit to stand transfers today.  Bicep curls 2 x 10 1 pounds D2 shoulder flexion 2 x 10 1 pound Punches 2 x 10 1 pound  LAQ 2 x 10  8/12 Nu-step 5 min   Sit to stand 3x3  improved tehcnoque with each trial. Posteiror loss of balance noted SBA - Max a   Air-ex rock: mod a 1 min  Air-ex normal base 3x30 sec CGA -> mod A   Coordination:   2 steps fwd 2 steps back 2x10 3 SF 3 SB 2x10   2 side steps 2x10   Seated balance:   Double arm press 1lb 2x10  Single arm press alt 2x10  Leg kicks with cuing to keep it slow 2x10   Hurdle tep over x10       PATIENT EDUCATION:  Education details: exercise rationale and technique   Person educated: Patient Education method: Explanation, Demonstration, Tactile cues, Verbal cues, Education comprehension: verbalized understanding, returned demonstration, verbal cues required, tactile cues required, and needs further education   HOME EXERCISE PROGRAM:  has a program already    ASSESSMENT:  CLINICAL IMPRESSION: The patient tolerated treatment well. Her balance improved from last session. She had better tolerance to there-ex. She has had no falls since the last session. We were able to work on similar balances exercises with less guarding today. Therapy will continue to progress the patient as tolerated. She continues to require skilled therapy for safety and skilled advancement of activity.  OBJECTIVE IMPAIRMENTS Abnormal gait, decreased activity tolerance, decreased balance, decreased mobility, difficulty walking, decreased strength, impaired tone, and pain.   ACTIVITY LIMITATIONS carrying, lifting, bending, standing, squatting, stairs, transfers, bed mobility, dressing, and locomotion level  PARTICIPATION LIMITATIONS: meal prep, cleaning, laundry, driving, shopping, community activity, and yard work  PERSONAL FACTORS Fibromyalgia; headaches, LBBB; osteopenia  are also affecting patient's functional outcome.   REHAB POTENTIAL: Good  CLINICAL DECISION MAKING: Evolving/moderate complexity increasing pain in her quads   EVALUATION COMPLEXITY: Moderate   GOALS: Goals reviewed with patient? Yes  SHORT TERM GOALS: Target date: 04/18/2023  Patient will transfer sit to stand smoothly and independently  Baseline: Goal status: INITIAL  2.  Patient will increase gross strength by 5lbs  Baseline:  Goal status: INITIAL  3.  Patient will improve BERG score by 10  Baseline:  Goal status: INITIAL LONG TERM GOALS: Target date: 05/16/2023   Patient will go improve balance to CGA for all balance exercises to improve safety  Baseline:  Goal status: INITIAL  2.  Patient will report a 50% reduction in falls  Baseline:  Goal status: INITIAL  3.  Patient will have a full balance program  Baseline:  Goal status: INITIAL   PLAN:  PT FREQUENCY: 2x/week  PT DURATION: 8 weeks  PLANNED INTERVENTIONS: Therapeutic exercises, Therapeutic activity, Neuromuscular  re-education, Balance training, Gait training, Patient/Family education, Self Care, Joint mobilization, Stair training, Aquatic Therapy, Cryotherapy, Moist heat, Taping, Manual therapy, and Re-evaluation  PLAN FOR NEXT SESSION: work on improving sit to stand, posterior, dynamic, and static balance. Work on general conditioning. Be aware of leg pain on any given day.     Lorayne Bender PT DPT 04/24/23 11:13 AM [facility] [address] [place], [place], [address] Phone: [phone]   Fax:  [phone]

## 2023-04-25 ENCOUNTER — Encounter (HOSPITAL_BASED_OUTPATIENT_CLINIC_OR_DEPARTMENT_OTHER): Payer: Self-pay | Admitting: Physical Therapy

## 2023-04-27 ENCOUNTER — Ambulatory Visit (HOSPITAL_BASED_OUTPATIENT_CLINIC_OR_DEPARTMENT_OTHER): Payer: Medicare Other | Admitting: Physical Therapy

## 2023-04-27 ENCOUNTER — Encounter (HOSPITAL_BASED_OUTPATIENT_CLINIC_OR_DEPARTMENT_OTHER): Payer: Self-pay | Admitting: Physical Therapy

## 2023-04-27 DIAGNOSIS — G20A1 Parkinson's disease without dyskinesia, without mention of fluctuations: Secondary | ICD-10-CM | POA: Diagnosis not present

## 2023-04-27 DIAGNOSIS — R2689 Other abnormalities of gait and mobility: Secondary | ICD-10-CM

## 2023-04-27 NOTE — Therapy (Signed)
OUTPATIENT PHYSICAL THERAPY LOWER EXTREMITY TREATMENT   Patient Name: Morgan [name] MRN: [id number] DOB:[birth year], 72 y.o., female Today's Date: 04/27/2023   PT End of Session - 04/27/23 1126     Visit Number 6    Number of Visits 24    Date for PT Re-Evaluation 06/13/23    Authorization Type progress note at visit 10    PT Start Time 1100    PT Stop Time 1142    PT Time Calculation (min) 42 min    Activity Tolerance Patient tolerated treatment well    Behavior During Therapy WFL for tasks assessed/performed              Past Medical History:  Diagnosis Date   Anxiety    Fibromyalgia    Headache    Hypercholesteremia    controlled on medication   Hyperlipidemia    Hypertension    no current meds   LBBB (left bundle branch block)    Melanoma (HCC)    MVP (mitral valve prolapse)    Osteopenia    Parkinson disease    Pneumonia    SVD (spontaneous vaginal delivery)    x 2   Past Surgical History:  Procedure Laterality Date   BLEPHAROPLASTY     cold knife conization     COLONOSCOPY     FH colon CA   CYSTOSCOPY W/ URETERAL STENT PLACEMENT Left 10/18/2021   Procedure: CYSTOSCOPY WITH RETROGRADE PYELOGRAM, URETERAL STENT PLACEMENT;  Surgeon: Jerilee Field, MD;  Location: [facility];  Service: Urology;  Laterality: Left;   DILATATION & CURETTAGE/HYSTEROSCOPY WITH MYOSURE N/A 02/14/2018   Procedure: ATTEMPTED DILATATION & CURETTAGE/HYSTEROSCOPY WITH MYOSURE;  Surgeon: Gerald Leitz, MD;  Location: [facility];  Service: Gynecology;  Laterality: N/A;  polyp   melanoma cancer     removed from left leg   RECONSTRUCTION OF EYELID     TONSILLECTOMY     TUBAL LIGATION     URETEROSCOPY WITH HOLMIUM LASER LITHOTRIPSY Left 12/17/2021   Procedure: URETEROSCOPY/HOLMIUM LASER/STENT EXCHANGE;  Surgeon: Jerilee Field, MD;  Location: [facility];  Service: Urology;  Laterality: Left;   WISDOM TOOTH EXTRACTION     Patient Active Problem List   Diagnosis Date Noted   COVID-19 virus  infection 05/16/2022   HTN (hypertension) 10/20/2021   Physical deconditioning 10/20/2021   Hydronephrosis with infection 10/18/2021   Bacteremia due to Escherichia coli 10/18/2021   Severe sepsis (HCC) 10/17/2021   UTI (urinary tract infection) 10/17/2021   Acute metabolic encephalopathy 10/17/2021   Parkinson disease 10/17/2021   Cervical stenosis (uterine cervix) 02/14/2018   Postmenopausal bleeding 02/14/2018   LBBB (left bundle branch block) 12/25/2015   PVCs (premature ventricular contractions) 12/25/2015   Hyperlipidemia 12/25/2015   Fibromyalgia 01/16/2014   Cholelithiasis 04/25/2013   Paralysis agitans 01/15/2013   Unspecified hereditary and idiopathic peripheral neuropathy 01/15/2013   [name] MD PCP:   REFERRING PROVIDER: Antoine Primas MD   REFERRING DIAG: Parkinsons   THERAPY DIAG:  Other abnormalities of gait and mobility  Parkinson's disease without dyskinesia or fluctuating manifestations  Rationale for Evaluation and Treatment Rehabilitation  ONSET DATE: Several years   SUBJECTIVE:   SUBJECTIVE STATEMENT: The patient reports she has been doing pretty well. Her husband reports they are still getting the timing down with the medications.    PERTINENT HISTORY: Fibromyalgia; headaches, LBBB; osteopenia   PAIN:  Are you having pain? no: NPRS scale:  Location:  Pain description:    Aggravating factors: just comes and goes  Relieving factors: just comes and goes; sometimes ibuprofen works but she is limited as to how much she can take  PRECAUTIONS: Patient reports that because of her heart she will pass out at times.   WEIGHT BEARING RESTRICTIONS No  FALLS:  Has patient fallen in last 6 months? Yes.  6 falls but likley more. Last fall    LIVING ENVIRONMENT: Stairs to the second floor in her house but she dosen't do much  4 steps in the front  2 steps in the garage  OCCUPATION: retired   Presenter, broadcasting: reading; goes to an exercise class    PLOF: Independent with household mobility with device  PATIENT GOALS  To have less pain in her legs and to move better    OBJECTIVE:  (measurements taken at evaluation unless otherwise noted)   DIAGNOSTIC FINDINGS:   PATIENT SURVEYS:  FOTO    COGNITION:  Overall cognitive status: Within functional limits for tasks assessed     SENSATION: Denies paresthesias   POSTURE: No Significant postural limitations  PALPATION: No trigger points noted   LOWER EXTREMITY ROM:  Passive ROM Right eval Left eval  Hip flexion    Hip extension    Hip abduction    Hip adduction    Hip internal rotation    Hip external rotation    Knee flexion    Knee extension    Ankle dorsiflexion    Ankle plantarflexion    Ankle inversion    Ankle eversion     (Blank rows = not tested)  WNL  LOWER EXTREMITY MMT:  MMT Right eval Left eval Right  7/22 Left  7/2  Hip flexion 21.7 29.7 23.5 25.9  Hip extension      Hip abduction 27.6 18.5 19.1 8.7  Hip adduction      Hip internal rotation      Hip external rotation      Knee flexion      Knee extension 23.5 20.2 17.7 16.8  Ankle dorsiflexion      Ankle plantarflexion      Ankle inversion      Ankle eversion       (Blank rows = not tested)  GAIT: Decreased hip flexion; leans to the left; right hip frop. Patient required CGA while walking and min a when she turned for balance. No change from IE   Functional tests:  Sit to stand: cuing to catch balance. Uses momentum to stand.    5x sit to stand Balance: 13 sec     Narrow base min a  Narrow base eyes closed min to mod a  Tandem stance Min a both ways.  Single leg stance 5 sec bilateral    BERG Balance Test          Date:   Sit to Stand 1  Standing unsupported 3  Sitting with back unsupported but feet supported 1  Stand to sit  1  Transfers  2  Standing unsupported with eyes closed 3  Standing unsupported feet together 3  From standing position, reach forward  with outstretched arm 1  From standing position, pick up object from floor 1  From standing position, turn and look behind over each shoulder 1  Turn 360 2  Standing unsupported, alternately place foot on step 1  Standing unsupported, one foot in front 1  Standing on one leg 0  Total:    21/30    TODAY'S TREATMENT:    8/29 Gait: long strides 50'x2  Marching  50'x2  Walking with head turns 30'x2  Walking looking up and down 30'x2  Hurdles 3 laps 6 4 hurdles side-to-side   Sit to stand transfers 3x4 after the first set we raised the table. She did better with posterior balance and controlled decent. Cuing for controlled decent.   8/26 NuStep 5 minutes  Air-ex rock: mod a 1 min  Air-ex normal base 3x30 sec CGA -> mod A Marching on Airex 2 x 10 some difficulty lifting her legs  Hurdles 3 laps 6 4 hurdles side-to-side  Step up 2x10 4 inch  Lateral step up 2x10 4 inch   Sit to stand transfers 3x4 after the first set we raised the table. She did better with posterior balance and controlled decent. Cuing for controlled decent.   8/22 NuStep 5 minutes  Air-ex rock: mod a 1 min  Air-ex normal base 3x30 sec CGA -> mod A Marching on Airex 2 x 10 some difficulty lifting her legs  Hurdles 3 laps 6 4 hurdles side-to-side Step over hurdle and back 2 x 10  Patient required min to mod assist for all sit to stand transfers today.  Bicep curls 2 x 10 1 pounds D2 shoulder flexion 2 x 10 1 pound Punches 2 x 10 1 pound  LAQ 2 x 10  8/12 Nu-step 5 min   Sit to stand 3x3  improved tehcnoque with each trial. Posteiror loss of balance noted SBA - Max a   Air-ex rock: mod a 1 min  Air-ex normal base 3x30 sec CGA -> mod A   Coordination:   2 steps fwd 2 steps back 2x10 3 SF 3 SB 2x10   2 side steps 2x10   Seated balance:   Double arm press 1lb 2x10  Single arm press alt 2x10  Leg kicks with cuing to keep it slow 2x10   Hurdle tep over x10       PATIENT  EDUCATION:  Education details: exercise rationale and technique   Person educated: Patient Education method: Explanation, Demonstration, Tactile cues, Verbal cues, Education comprehension: verbalized understanding, returned demonstration, verbal cues required, tactile cues required, and needs further education   HOME EXERCISE PROGRAM:  has a program already   ASSESSMENT:  CLINICAL IMPRESSION: Therapy focused on ambulation with challenges. She required min to mod a. She required the most eassist with head turn. Overall her husband feels like she has made some improvement . She is still falling frequently. She continues to require skilled therapy 2nd to her being a high fall risk and requires the skill of therapist for safety with strengthening and balance.   OBJECTIVE IMPAIRMENTS Abnormal gait, decreased activity tolerance, decreased balance, decreased mobility, difficulty walking, decreased strength, impaired tone, and pain.   ACTIVITY LIMITATIONS carrying, lifting, bending, standing, squatting, stairs, transfers, bed mobility, dressing, and locomotion level  PARTICIPATION LIMITATIONS: meal prep, cleaning, laundry, driving, shopping, community activity, and yard work  PERSONAL FACTORS Fibromyalgia; headaches, LBBB; osteopenia  are also affecting patient's functional outcome.   REHAB POTENTIAL: Good  CLINICAL DECISION MAKING: Evolving/moderate complexity increasing pain in her quads   EVALUATION COMPLEXITY: Moderate   GOALS: Goals reviewed with patient? Yes  SHORT TERM GOALS: Target date: 04/18/2023  Patient will transfer sit to stand smoothly and independently  Baseline: Goal status: INITIAL  2.  Patient will increase gross strength by 5lbs  Baseline:  Goal status: INITIAL  3.  Patient will improve BERG score by 10  Baseline:  Goal status: INITIAL LONG TERM GOALS: Target  date: 05/16/2023   Patient will go improve balance to CGA for all balance exercises to improve safety   Baseline:  Goal status: INITIAL  2.  Patient will report a 50% reduction in falls  Baseline:  Goal status: INITIAL  3.  Patient will have a full balance program  Baseline:  Goal status: INITIAL   PLAN: PT FREQUENCY: 2x/week  PT DURATION: 8 weeks  PLANNED INTERVENTIONS: Therapeutic exercises, Therapeutic activity, Neuromuscular re-education, Balance training, Gait training, Patient/Family education, Self Care, Joint mobilization, Stair training, Aquatic Therapy, Cryotherapy, Moist heat, Taping, Manual therapy, and Re-evaluation  PLAN FOR NEXT SESSION: work on improving sit to stand, posterior, dynamic, and static balance. Work on general conditioning. Be aware of leg pain on any given day.     Lorayne Bender PT DPT 04/27/23 11:39 AM [facility] [address] [place], [place], [address] Phone: [phone]   Fax:  [phone]

## 2023-05-02 ENCOUNTER — Ambulatory Visit (HOSPITAL_BASED_OUTPATIENT_CLINIC_OR_DEPARTMENT_OTHER): Payer: Medicare Other | Attending: Family Medicine | Admitting: Physical Therapy

## 2023-05-02 DIAGNOSIS — G20A1 Parkinson's disease without dyskinesia, without mention of fluctuations: Secondary | ICD-10-CM | POA: Insufficient documentation

## 2023-05-02 DIAGNOSIS — R2689 Other abnormalities of gait and mobility: Secondary | ICD-10-CM | POA: Diagnosis not present

## 2023-05-02 NOTE — Therapy (Signed)
OUTPATIENT PHYSICAL THERAPY LOWER EXTREMITY TREATMENT   Patient Name: Morgan [name] MRN: [id number] DOB:[birth year], 72 y.o., female Today's Date: 05/02/2023      Past Medical History:  Diagnosis Date   Anxiety    Fibromyalgia    Headache    Hypercholesteremia    controlled on medication   Hyperlipidemia    Hypertension    no current meds   LBBB (left bundle branch block)    Melanoma (HCC)    MVP (mitral valve prolapse)    Osteopenia    Parkinson disease    Pneumonia    SVD (spontaneous vaginal delivery)    x 2   Past Surgical History:  Procedure Laterality Date   BLEPHAROPLASTY     cold knife conization     COLONOSCOPY     FH colon CA   CYSTOSCOPY W/ URETERAL STENT PLACEMENT Left 10/18/2021   Procedure: CYSTOSCOPY WITH RETROGRADE PYELOGRAM, URETERAL STENT PLACEMENT;  Surgeon: Jerilee Field, MD;  Location: [facility];  Service: Urology;  Laterality: Left;   DILATATION & CURETTAGE/HYSTEROSCOPY WITH MYOSURE N/A 02/14/2018   Procedure: ATTEMPTED DILATATION & CURETTAGE/HYSTEROSCOPY WITH MYOSURE;  Surgeon: Gerald Leitz, MD;  Location: [facility];  Service: Gynecology;  Laterality: N/A;  polyp   melanoma cancer     removed from left leg   RECONSTRUCTION OF EYELID     TONSILLECTOMY     TUBAL LIGATION     URETEROSCOPY WITH HOLMIUM LASER LITHOTRIPSY Left 12/17/2021   Procedure: URETEROSCOPY/HOLMIUM LASER/STENT EXCHANGE;  Surgeon: Jerilee Field, MD;  Location: [facility];  Service: Urology;  Laterality: Left;   WISDOM TOOTH EXTRACTION     Patient Active Problem List   Diagnosis Date Noted   COVID-19 virus infection 05/16/2022   HTN (hypertension) 10/20/2021   Physical deconditioning 10/20/2021   Hydronephrosis with infection 10/18/2021   Bacteremia due to Escherichia coli 10/18/2021   Severe sepsis (HCC) 10/17/2021   UTI (urinary tract infection) 10/17/2021   Acute metabolic encephalopathy 10/17/2021   Parkinson disease 10/17/2021   Cervical stenosis (uterine cervix)  02/14/2018   Postmenopausal bleeding 02/14/2018   LBBB (left bundle branch block) 12/25/2015   PVCs (premature ventricular contractions) 12/25/2015   Hyperlipidemia 12/25/2015   Fibromyalgia 01/16/2014   Cholelithiasis 04/25/2013   Paralysis agitans 01/15/2013   Unspecified hereditary and idiopathic peripheral neuropathy 01/15/2013   [name] MD PCP:   Cindi Carbon PROVIDER: Antoine Primas MD   REFERRING DIAG: Parkinsons   THERAPY DIAG:  No diagnosis found.  Rationale for Evaluation and Treatment Rehabilitation  ONSET DATE: Several years   SUBJECTIVE:   SUBJECTIVE STATEMENT: The patient ate some food this morning that effect the absorption of her medicine. Her husband reports she is pretty out of it.    PERTINENT HISTORY: Fibromyalgia; headaches, LBBB; osteopenia   PAIN:  Are you having pain? no: NPRS scale:  Location:  Pain description:    Aggravating factors: just comes and goes  Relieving factors: just comes and goes; sometimes ibuprofen works but she is limited as to how much she can take  PRECAUTIONS: Patient reports that because of her heart she will pass out at times.   WEIGHT BEARING RESTRICTIONS No  FALLS:  Has patient fallen in last 6 months? Yes.  6 falls but likley more. Last fall    LIVING ENVIRONMENT: Stairs to the second floor in her house but she dosen't do much  4 steps in the front  2 steps in the garage  OCCUPATION: retired   Presenter, broadcasting: reading; goes to an  exercise class   PLOF: Independent with household mobility with device  PATIENT GOALS  To have less pain in her legs and to move better    OBJECTIVE:  (measurements taken at evaluation unless otherwise noted)   DIAGNOSTIC FINDINGS:   PATIENT SURVEYS:  FOTO    COGNITION:  Overall cognitive status: Within functional limits for tasks assessed     SENSATION: Denies paresthesias   POSTURE: No Significant postural limitations  PALPATION: No trigger points noted   LOWER  EXTREMITY ROM:  Passive ROM Right eval Left eval  Hip flexion    Hip extension    Hip abduction    Hip adduction    Hip internal rotation    Hip external rotation    Knee flexion    Knee extension    Ankle dorsiflexion    Ankle plantarflexion    Ankle inversion    Ankle eversion     (Blank rows = not tested)  WNL  LOWER EXTREMITY MMT:  MMT Right eval Left eval Right  7/22 Left  7/2  Hip flexion 21.7 29.7 23.5 25.9  Hip extension      Hip abduction 27.6 18.5 19.1 8.7  Hip adduction      Hip internal rotation      Hip external rotation      Knee flexion      Knee extension 23.5 20.2 17.7 16.8  Ankle dorsiflexion      Ankle plantarflexion      Ankle inversion      Ankle eversion       (Blank rows = not tested)  GAIT: Decreased hip flexion; leans to the left; right hip frop. Patient required CGA while walking and min a when she turned for balance. No change from IE   Functional tests:  Sit to stand: cuing to catch balance. Uses momentum to stand.    5x sit to stand Balance: 13 sec     Narrow base min a  Narrow base eyes closed min to mod a  Tandem stance Min a both ways.  Single leg stance 5 sec bilateral    BERG Balance Test          Date:   Sit to Stand 1  Standing unsupported 3  Sitting with back unsupported but feet supported 1  Stand to sit  1  Transfers  2  Standing unsupported with eyes closed 3  Standing unsupported feet together 3  From standing position, reach forward with outstretched arm 1  From standing position, pick up object from floor 1  From standing position, turn and look behind over each shoulder 1  Turn 360 2  Standing unsupported, alternately place foot on step 1  Standing unsupported, one foot in front 1  Standing on one leg 0  Total:    21/30    TODAY'S TREATMENT: 9/3  Laq 3x10 bilateral red  March 3x15 bilateral red  Hip abdcution 3x15 bilateral red   Mod cuing for range   Shoulder press 1 lb 2x15   Shoulder punch 2x10 bilateral biceps curl 3x15 1 lb   Stnading weight shift x20    8/29 Gait: long strides 50'x2  Marching 50'x2  Walking with head turns 30'x2  Walking looking up and down 30'x2  Hurdles 3 laps 6 4 hurdles side-to-side   Sit to stand transfers 3x4 after the first set we raised the table. She did better with posterior balance and controlled decent. Cuing for controlled decent.   8/26 NuStep  5 minutes  Air-ex rock: mod a 1 min  Air-ex normal base 3x30 sec CGA -> mod A Marching on Airex 2 x 10 some difficulty lifting her legs  Hurdles 3 laps 6 4 hurdles side-to-side  Step up 2x10 4 inch  Lateral step up 2x10 4 inch   Sit to stand transfers 3x4 after the first set we raised the table. She did better with posterior balance and controlled decent. Cuing for controlled decent.   8/22 NuStep 5 minutes  Air-ex rock: mod a 1 min  Air-ex normal base 3x30 sec CGA -> mod A Marching on Airex 2 x 10 some difficulty lifting her legs  Hurdles 3 laps 6 4 hurdles side-to-side Step over hurdle and back 2 x 10  Patient required min to mod assist for all sit to stand transfers today.  Bicep curls 2 x 10 1 pounds D2 shoulder flexion 2 x 10 1 pound Punches 2 x 10 1 pound  LAQ 2 x 10  8/12 Nu-step 5 min   Sit to stand 3x3  improved tehcnoque with each trial. Posteiror loss of balance noted SBA - Max a   Air-ex rock: mod a 1 min  Air-ex normal base 3x30 sec CGA -> mod A   Coordination:   2 steps fwd 2 steps back 2x10 3 SF 3 SB 2x10   2 side steps 2x10   Seated balance:   Double arm press 1lb 2x10  Single arm press alt 2x10  Leg kicks with cuing to keep it slow 2x10   Hurdle tep over x10       PATIENT EDUCATION:  Education details: exercise rationale and technique   Person educated: Patient Education method: Explanation, Demonstration, Tactile cues, Verbal cues, Education comprehension: verbalized understanding, returned demonstration, verbal  cues required, tactile cues required, and needs further education   HOME EXERCISE PROGRAM:  has a program already   ASSESSMENT:  CLINICAL IMPRESSION: The patients ability was limited today. She required cuing to go through her full range with her exercises. She reported fatigue. We tried standing exercises but she had difficulty. She required mod a to stand. We will continue to progress as tolerated.    OBJECTIVE IMPAIRMENTS Abnormal gait, decreased activity tolerance, decreased balance, decreased mobility, difficulty walking, decreased strength, impaired tone, and pain.   ACTIVITY LIMITATIONS carrying, lifting, bending, standing, squatting, stairs, transfers, bed mobility, dressing, and locomotion level  PARTICIPATION LIMITATIONS: meal prep, cleaning, laundry, driving, shopping, community activity, and yard work  PERSONAL FACTORS Fibromyalgia; headaches, LBBB; osteopenia  are also affecting patient's functional outcome.   REHAB POTENTIAL: Good  CLINICAL DECISION MAKING: Evolving/moderate complexity increasing pain in her quads   EVALUATION COMPLEXITY: Moderate   GOALS: Goals reviewed with patient? Yes  SHORT TERM GOALS: Target date: 04/18/2023  Patient will transfer sit to stand smoothly and independently  Baseline: Goal status: INITIAL  2.  Patient will increase gross strength by 5lbs  Baseline:  Goal status: INITIAL  3.  Patient will improve BERG score by 10  Baseline:  Goal status: INITIAL LONG TERM GOALS: Target date: 05/16/2023   Patient will go improve balance to CGA for all balance exercises to improve safety  Baseline:  Goal status: INITIAL  2.  Patient will report a 50% reduction in falls  Baseline:  Goal status: INITIAL  3.  Patient will have a full balance program  Baseline:  Goal status: INITIAL   PLAN: PT FREQUENCY: 2x/week  PT DURATION: 8 weeks  PLANNED INTERVENTIONS: Therapeutic exercises,  Therapeutic activity, Neuromuscular re-education,  Balance training, Gait training, Patient/Family education, Self Care, Joint mobilization, Stair training, Aquatic Therapy, Cryotherapy, Moist heat, Taping, Manual therapy, and Re-evaluation  PLAN FOR NEXT SESSION: work on improving sit to stand, posterior, dynamic, and static balance. Work on general conditioning. Be aware of leg pain on any given day.     Lorayne Bender PT DPT 05/02/23 11:44 AM [facility] [address] [place], [place], [address] Phone: [phone]   Fax:  [phone]

## 2023-05-03 DIAGNOSIS — N3946 Mixed incontinence: Secondary | ICD-10-CM | POA: Diagnosis not present

## 2023-05-04 ENCOUNTER — Ambulatory Visit (HOSPITAL_BASED_OUTPATIENT_CLINIC_OR_DEPARTMENT_OTHER): Payer: Medicare Other | Admitting: Physical Therapy

## 2023-05-04 DIAGNOSIS — R2689 Other abnormalities of gait and mobility: Secondary | ICD-10-CM | POA: Diagnosis not present

## 2023-05-04 DIAGNOSIS — G20A1 Parkinson's disease without dyskinesia, without mention of fluctuations: Secondary | ICD-10-CM

## 2023-05-04 NOTE — Therapy (Signed)
OUTPATIENT PHYSICAL THERAPY LOWER EXTREMITY TREATMENT   Patient Name: Morgan [name] MRN: [id number] DOB:06/07/1951, 72 y.o., female Today's Date: 05/04/2023      Past Medical History:  Diagnosis Date   Anxiety    Fibromyalgia    Headache    Hypercholesteremia    controlled on medication   Hyperlipidemia    Hypertension    no current meds   LBBB (left bundle branch block)    Melanoma (HCC)    MVP (mitral valve prolapse)    Osteopenia    Parkinson disease    Pneumonia    SVD (spontaneous vaginal delivery)    x 2   Past Surgical History:  Procedure Laterality Date   BLEPHAROPLASTY     cold knife conization     COLONOSCOPY     FH colon CA   CYSTOSCOPY W/ URETERAL STENT PLACEMENT Left 10/18/2021   Procedure: CYSTOSCOPY WITH RETROGRADE PYELOGRAM, URETERAL STENT PLACEMENT;  Surgeon: Jerilee Field, MD;  Location: [facility];  Service: Urology;  Laterality: Left;   DILATATION & CURETTAGE/HYSTEROSCOPY WITH MYOSURE N/A 02/14/2018   Procedure: ATTEMPTED DILATATION & CURETTAGE/HYSTEROSCOPY WITH MYOSURE;  Surgeon: Gerald Leitz, MD;  Location: [facility];  Service: Gynecology;  Laterality: N/A;  polyp   melanoma cancer     removed from left leg   RECONSTRUCTION OF EYELID     TONSILLECTOMY     TUBAL LIGATION     URETEROSCOPY WITH HOLMIUM LASER LITHOTRIPSY Left 12/17/2021   Procedure: URETEROSCOPY/HOLMIUM LASER/STENT EXCHANGE;  Surgeon: Jerilee Field, MD;  Location: [facility];  Service: Urology;  Laterality: Left;   WISDOM TOOTH EXTRACTION     Patient Active Problem List   Diagnosis Date Noted   COVID-19 virus infection 05/16/2022   HTN (hypertension) 10/20/2021   Physical deconditioning 10/20/2021   Hydronephrosis with infection 10/18/2021   Bacteremia due to Escherichia coli 10/18/2021   Severe sepsis (HCC) 10/17/2021   UTI (urinary tract infection) 10/17/2021   Acute metabolic encephalopathy 10/17/2021   Parkinson disease 10/17/2021   Cervical stenosis (uterine cervix)  02/14/2018   Postmenopausal bleeding 02/14/2018   LBBB (left bundle branch block) 12/25/2015   PVCs (premature ventricular contractions) 12/25/2015   Hyperlipidemia 12/25/2015   Fibromyalgia 01/16/2014   Cholelithiasis 04/25/2013   Paralysis agitans 01/15/2013   Unspecified hereditary and idiopathic peripheral neuropathy 01/15/2013   [name] MD PCP:   Cindi Carbon PROVIDER: Antoine Primas MD   REFERRING DIAG: Parkinsons   THERAPY DIAG:  No diagnosis found.  Rationale for Evaluation and Treatment Rehabilitation  ONSET DATE: Several years   SUBJECTIVE:   SUBJECTIVE STATEMENT: The patient ate some food this morning that effect the absorption of her medicine. Her husband reports she is pretty out of it.    PERTINENT HISTORY: Fibromyalgia; headaches, LBBB; osteopenia   PAIN:  Are you having pain? no: NPRS scale:  Location:  Pain description:    Aggravating factors: just comes and goes  Relieving factors: just comes and goes; sometimes ibuprofen works but she is limited as to how much she can take  PRECAUTIONS: Patient reports that because of her heart she will pass out at times.   WEIGHT BEARING RESTRICTIONS No  FALLS:  Has patient fallen in last 6 months? Yes.  6 falls but likley more. Last fall    LIVING ENVIRONMENT: Stairs to the second floor in her house but she dosen't do much  4 steps in the front  2 steps in the garage  OCCUPATION: retired   Presenter, broadcasting: reading; goes to an  exercise class   PLOF: Independent with household mobility with device  PATIENT GOALS  To have less pain in her legs and to move better    OBJECTIVE:  (measurements taken at evaluation unless otherwise noted)   DIAGNOSTIC FINDINGS:   PATIENT SURVEYS:  FOTO    COGNITION:  Overall cognitive status: Within functional limits for tasks assessed     SENSATION: Denies paresthesias   POSTURE: No Significant postural limitations  PALPATION: No trigger points noted   LOWER  EXTREMITY ROM:  Passive ROM Right eval Left eval  Hip flexion    Hip extension    Hip abduction    Hip adduction    Hip internal rotation    Hip external rotation    Knee flexion    Knee extension    Ankle dorsiflexion    Ankle plantarflexion    Ankle inversion    Ankle eversion     (Blank rows = not tested)  WNL  LOWER EXTREMITY MMT:  MMT Right eval Left eval Right  7/22 Left  7/2  Hip flexion 21.7 29.7 23.5 25.9  Hip extension      Hip abduction 27.6 18.5 19.1 8.7  Hip adduction      Hip internal rotation      Hip external rotation      Knee flexion      Knee extension 23.5 20.2 17.7 16.8  Ankle dorsiflexion      Ankle plantarflexion      Ankle inversion      Ankle eversion       (Blank rows = not tested)  GAIT: Decreased hip flexion; leans to the left; right hip frop. Patient required CGA while walking and min a when she turned for balance. No change from IE   Functional tests:  Sit to stand: cuing to catch balance. Uses momentum to stand.    5x sit to stand Balance: 13 sec     Narrow base min a  Narrow base eyes closed min to mod a  Tandem stance Min a both ways.  Single leg stance 5 sec bilateral    BERG Balance Test          Date:   Sit to Stand 1  Standing unsupported 3  Sitting with back unsupported but feet supported 1  Stand to sit  1  Transfers  2  Standing unsupported with eyes closed 3  Standing unsupported feet together 3  From standing position, reach forward with outstretched arm 1  From standing position, pick up object from floor 1  From standing position, turn and look behind over each shoulder 1  Turn 360 2  Standing unsupported, alternately place foot on step 1  Standing unsupported, one foot in front 1  Standing on one leg 0  Total:    21/30    TODAY'S TREATMENT: 9/5 Gait: long strides 50'x2  Marching 50'x2  Walking with head turns 30'x2  Walking looking up and down 30'x2  Hurdles 3 laps 6 4 hurdles  side-to-side  Standing: march 3x15  Heel/toe 3x15  Squat 3x15    Coordination   Fwd fwd back back with rhythm x20   Sit to stand 5x    9/3  Laq 3x10 bilateral red  March 3x15 bilateral red  Hip abdcution 3x15 bilateral red   Mod cuing for range   Shoulder press 1 lb 2x15  Shoulder punch 2x10 bilateral biceps curl 3x15 1 lb   Stnading weight shift x20  8/29 Gait: long strides 50'x2  Marching 50'x2  Walking with head turns 30'x2  Walking looking up and down 30'x2  Hurdles 3 laps 6 4 hurdles side-to-side   Sit to stand transfers 3x4 after the first set we raised the table. She did better with posterior balance and controlled decent. Cuing for controlled decent.   8/26 NuStep 5 minutes  Air-ex rock: mod a 1 min  Air-ex normal base 3x30 sec CGA -> mod A Marching on Airex 2 x 10 some difficulty lifting her legs  Hurdles 3 laps 6 4 hurdles side-to-side  Step up 2x10 4 inch  Lateral step up 2x10 4 inch   Sit to stand transfers 3x4 after the first set we raised the table. She did better with posterior balance and controlled decent. Cuing for controlled decent.   8/22 NuStep 5 minutes  Air-ex rock: mod a 1 min  Air-ex normal base 3x30 sec CGA -> mod A Marching on Airex 2 x 10 some difficulty lifting her legs  Hurdles 3 laps 6 4 hurdles side-to-side Step over hurdle and back 2 x 10  Patient required min to mod assist for all sit to stand transfers today.  Bicep curls 2 x 10 1 pounds D2 shoulder flexion 2 x 10 1 pound Punches 2 x 10 1 pound  LAQ 2 x 10  8/12 Nu-step 5 min   Sit to stand 3x3  improved tehcnoque with each trial. Posteiror loss of balance noted SBA - Max a   Air-ex rock: mod a 1 min  Air-ex normal base 3x30 sec CGA -> mod A   Coordination:   2 steps fwd 2 steps back 2x10 3 SF 3 SB 2x10   2 side steps 2x10   Seated balance:   Double arm press 1lb 2x10  Single arm press alt 2x10  Leg kicks with cuing to keep it slow 2x10    Hurdle tep over x10       PATIENT EDUCATION:  Education details: exercise rationale and technique   Person educated: Patient Education method: Explanation, Demonstration, Tactile cues, Verbal cues, Education comprehension: verbalized understanding, returned demonstration, verbal cues required, tactile cues required, and needs further education   HOME EXERCISE PROGRAM:  has a program already   ASSESSMENT:  CLINICAL IMPRESSION: The did much better today. She was able to do standing exercises. She became fatigued after She required cuing to do her long strides and marching. We will continue to advance her exercises as tolerated. She required min to mod assist for balance. She continues to require a high skill level of treatment for balance and progression of activity.   OBJECTIVE IMPAIRMENTS Abnormal gait, decreased activity tolerance, decreased balance, decreased mobility, difficulty walking, decreased strength, impaired tone, and pain.   ACTIVITY LIMITATIONS carrying, lifting, bending, standing, squatting, stairs, transfers, bed mobility, dressing, and locomotion level  PARTICIPATION LIMITATIONS: meal prep, cleaning, laundry, driving, shopping, community activity, and yard work  PERSONAL FACTORS Fibromyalgia; headaches, LBBB; osteopenia  are also affecting patient's functional outcome.   REHAB POTENTIAL: Good  CLINICAL DECISION MAKING: Evolving/moderate complexity increasing pain in her quads   EVALUATION COMPLEXITY: Moderate   GOALS: Goals reviewed with patient? Yes  SHORT TERM GOALS: Target date: 04/18/2023  Patient will transfer sit to stand smoothly and independently  Baseline: Goal status: INITIAL  2.  Patient will increase gross strength by 5lbs  Baseline:  Goal status: INITIAL  3.  Patient will improve BERG score by 10  Baseline:  Goal status: INITIAL  LONG TERM GOALS: Target date: 05/16/2023   Patient will go improve balance to CGA for all balance  exercises to improve safety  Baseline:  Goal status: INITIAL  2.  Patient will report a 50% reduction in falls  Baseline:  Goal status: INITIAL  3.  Patient will have a full balance program  Baseline:  Goal status: INITIAL   PLAN: PT FREQUENCY: 2x/week  PT DURATION: 8 weeks  PLANNED INTERVENTIONS: Therapeutic exercises, Therapeutic activity, Neuromuscular re-education, Balance training, Gait training, Patient/Family education, Self Care, Joint mobilization, Stair training, Aquatic Therapy, Cryotherapy, Moist heat, Taping, Manual therapy, and Re-evaluation  PLAN FOR NEXT SESSION: work on improving sit to stand, posterior, dynamic, and static balance. Work on general conditioning. Be aware of leg pain on any given day.     Lorayne Bender PT DPT 05/04/23 11:34 AM [facility] [address] [place], [place], [address] Phone: [phone]   Fax:  [phone]

## 2023-05-08 ENCOUNTER — Encounter (HOSPITAL_BASED_OUTPATIENT_CLINIC_OR_DEPARTMENT_OTHER): Payer: Self-pay | Admitting: Physical Therapy

## 2023-05-08 ENCOUNTER — Ambulatory Visit (HOSPITAL_BASED_OUTPATIENT_CLINIC_OR_DEPARTMENT_OTHER): Payer: Medicare Other | Admitting: Physical Therapy

## 2023-05-08 DIAGNOSIS — G20A1 Parkinson's disease without dyskinesia, without mention of fluctuations: Secondary | ICD-10-CM

## 2023-05-08 DIAGNOSIS — R2689 Other abnormalities of gait and mobility: Secondary | ICD-10-CM | POA: Diagnosis not present

## 2023-05-09 NOTE — Therapy (Signed)
OUTPATIENT PHYSICAL THERAPY LOWER EXTREMITY TREATMENT   Patient Name: Morgan [name] MRN: [id number] DOB:[birth year], 72 y.o., female Today's Date: 05/09/2023   PT End of Session - 05/09/23 0800     Visit Number 9    Number of Visits 24    Date for PT Re-Evaluation 06/13/23    Authorization Type progress note at visit 10    PT Start Time 1100    PT Stop Time 1143    PT Time Calculation (min) 43 min    Activity Tolerance Patient tolerated treatment well    Behavior During Therapy WFL for tasks assessed/performed               Past Medical History:  Diagnosis Date   Anxiety    Fibromyalgia    Headache    Hypercholesteremia    controlled on medication   Hyperlipidemia    Hypertension    no current meds   LBBB (left bundle branch block)    Melanoma (HCC)    MVP (mitral valve prolapse)    Osteopenia    Parkinson disease    Pneumonia    SVD (spontaneous vaginal delivery)    x 2   Past Surgical History:  Procedure Laterality Date   BLEPHAROPLASTY     cold knife conization     COLONOSCOPY     FH colon CA   CYSTOSCOPY W/ URETERAL STENT PLACEMENT Left 10/18/2021   Procedure: CYSTOSCOPY WITH RETROGRADE PYELOGRAM, URETERAL STENT PLACEMENT;  Surgeon: Jerilee Field, MD;  Location: [facility];  Service: Urology;  Laterality: Left;   DILATATION & CURETTAGE/HYSTEROSCOPY WITH MYOSURE N/A 02/14/2018   Procedure: ATTEMPTED DILATATION & CURETTAGE/HYSTEROSCOPY WITH MYOSURE;  Surgeon: Gerald Leitz, MD;  Location: [facility];  Service: Gynecology;  Laterality: N/A;  polyp   melanoma cancer     removed from left leg   RECONSTRUCTION OF EYELID     TONSILLECTOMY     TUBAL LIGATION     URETEROSCOPY WITH HOLMIUM LASER LITHOTRIPSY Left 12/17/2021   Procedure: URETEROSCOPY/HOLMIUM LASER/STENT EXCHANGE;  Surgeon: Jerilee Field, MD;  Location: [facility];  Service: Urology;  Laterality: Left;   WISDOM TOOTH EXTRACTION     Patient Active Problem List   Diagnosis Date Noted   COVID-19  virus infection 05/16/2022   HTN (hypertension) 10/20/2021   Physical deconditioning 10/20/2021   Hydronephrosis with infection 10/18/2021   Bacteremia due to Escherichia coli 10/18/2021   Severe sepsis (HCC) 10/17/2021   UTI (urinary tract infection) 10/17/2021   Acute metabolic encephalopathy 10/17/2021   Parkinson disease 10/17/2021   Cervical stenosis (uterine cervix) 02/14/2018   Postmenopausal bleeding 02/14/2018   LBBB (left bundle branch block) 12/25/2015   PVCs (premature ventricular contractions) 12/25/2015   Hyperlipidemia 12/25/2015   Fibromyalgia 01/16/2014   Cholelithiasis 04/25/2013   Paralysis agitans 01/15/2013   Unspecified hereditary and idiopathic peripheral neuropathy 01/15/2013   [name] MD PCP:   Cindi Carbon PROVIDER: Antoine Primas MD   REFERRING DIAG: Parkinsons   THERAPY DIAG:  Other abnormalities of gait and mobility  Parkinson's disease without dyskinesia or fluctuating manifestations  Rationale for Evaluation and Treatment Rehabilitation  ONSET DATE: Several years   SUBJECTIVE:   SUBJECTIVE STATEMENT: Patient's husband reports that she has been givin an extra half a pill which has made her more alert but has decreased balance.  She reports everything else is about the same PERTINENT HISTORY: Fibromyalgia; headaches, LBBB; osteopenia   PAIN:  Are you having pain? no: NPRS scale:  Location:  Pain description:  Aggravating factors: just comes and goes  Relieving factors: just comes and goes; sometimes ibuprofen works but she is limited as to how much she can take  PRECAUTIONS: Patient reports that because of her heart she will pass out at times.   WEIGHT BEARING RESTRICTIONS No  FALLS:  Has patient fallen in last 6 months? Yes.  6 falls but likley more. Last fall    LIVING ENVIRONMENT: Stairs to the second floor in her house but she dosen't do much  4 steps in the front  2 steps in the garage  OCCUPATION: retired   Presenter, broadcasting:  reading; goes to an exercise class   PLOF: Independent with household mobility with device  PATIENT GOALS  To have less pain in her legs and to move better    OBJECTIVE:  (measurements taken at evaluation unless otherwise noted)   DIAGNOSTIC FINDINGS:   PATIENT SURVEYS:  FOTO    COGNITION:  Overall cognitive status: Within functional limits for tasks assessed     SENSATION: Denies paresthesias   POSTURE: No Significant postural limitations  PALPATION: No trigger points noted   LOWER EXTREMITY ROM:  Passive ROM Right eval Left eval  Hip flexion    Hip extension    Hip abduction    Hip adduction    Hip internal rotation    Hip external rotation    Knee flexion    Knee extension    Ankle dorsiflexion    Ankle plantarflexion    Ankle inversion    Ankle eversion     (Blank rows = not tested)  WNL  LOWER EXTREMITY MMT:  MMT Right eval Left eval Right  7/22 Left  7/2  Hip flexion 21.7 29.7 23.5 25.9  Hip extension      Hip abduction 27.6 18.5 19.1 8.7  Hip adduction      Hip internal rotation      Hip external rotation      Knee flexion      Knee extension 23.5 20.2 17.7 16.8  Ankle dorsiflexion      Ankle plantarflexion      Ankle inversion      Ankle eversion       (Blank rows = not tested)  GAIT: Decreased hip flexion; leans to the left; right hip frop. Patient required CGA while walking and min a when she turned for balance. No change from IE   Functional tests:  Sit to stand: cuing to catch balance. Uses momentum to stand.    5x sit to stand Balance: 13 sec     Narrow base min a  Narrow base eyes closed min to mod a  Tandem stance Min a both ways.  Single leg stance 5 sec bilateral    BERG Balance Test          Date:   Sit to Stand 1  Standing unsupported 3  Sitting with back unsupported but feet supported 1  Stand to sit  1  Transfers  2  Standing unsupported with eyes closed 3  Standing unsupported feet together 3  From  standing position, reach forward with outstretched arm 1  From standing position, pick up object from floor 1  From standing position, turn and look behind over each shoulder 1  Turn 360 2  Standing unsupported, alternately place foot on step 1  Standing unsupported, one foot in front 1  Standing on one leg 0  Total:    21/30    TODAY'S TREATMENT: 9/10 Gait: long  strides 50'x2  Marching 50'x2  Side steps 50'x4 with seated rest breaks  Increased assistance required today for balance mod to max assist at times.  Gait  Single hurdle step over 2 x 15  Seated: Bilateral press 2 x 15 Bilateral punch 3 x 15 Bilateral curl 2 x 15 All performed with 1 pound weight with contact-guard assist for balance and sitting.  Patient sitting back unsupported.  Airex: Rock/back x 20 min assist for balance Narrow base 3 x 20 seconds eyes open 3 x 20 seconds eyes closed  9/5 Gait: long strides 50'x2  Marching 50'x2  Walking with head turns 30'x2  Walking looking up and down 30'x2  Hurdles 3 laps 6 4 hurdles side-to-side  Standing: march 3x15  Heel/toe 3x15  Squat 3x15    Coordination   Fwd fwd back back with rhythm x20   Sit to stand 5x    9/3  Laq 3x10 bilateral red  March 3x15 bilateral red  Hip abdcution 3x15 bilateral red   Mod cuing for range   Shoulder press 1 lb 2x15  Shoulder punch 2x10 bilateral biceps curl 3x15 1 lb   Stnading weight shift x20    8/29 Gait: long strides 50'x2  Marching 50'x2  Walking with head turns 30'x2  Walking looking up and down 30'x2  Hurdles 3 laps 6 4 hurdles side-to-side   Sit to stand transfers 3x4 after the first set we raised the table. She did better with posterior balance and controlled decent. Cuing for controlled decent.   8/26 NuStep 5 minutes  Air-ex rock: mod a 1 min  Air-ex normal base 3x30 sec CGA -> mod A Marching on Airex 2 x 10 some difficulty lifting her legs  Hurdles 3 laps 6 4 hurdles  side-to-side  Step up 2x10 4 inch  Lateral step up 2x10 4 inch   Sit to stand transfers 3x4 after the first set we raised the table. She did better with posterior balance and controlled decent. Cuing for controlled decent.   8/22 NuStep 5 minutes  Air-ex rock: mod a 1 min  Air-ex normal base 3x30 sec CGA -> mod A Marching on Airex 2 x 10 some difficulty lifting her legs  Hurdles 3 laps 6 4 hurdles side-to-side Step over hurdle and back 2 x 10  Patient required min to mod assist for all sit to stand transfers today.  Bicep curls 2 x 10 1 pounds D2 shoulder flexion 2 x 10 1 pound Punches 2 x 10 1 pound  LAQ 2 x 10  8/12 Nu-step 5 min   Sit to stand 3x3  improved tehcnoque with each trial. Posteiror loss of balance noted SBA - Max a   Air-ex rock: mod a 1 min  Air-ex normal base 3x30 sec CGA -> mod A   Coordination:   2 steps fwd 2 steps back 2x10 3 SF 3 SB 2x10   2 side steps 2x10   Seated balance:   Double arm press 1lb 2x10  Single arm press alt 2x10  Leg kicks with cuing to keep it slow 2x10   Hurdle tep over x10       PATIENT EDUCATION:  Education details: exercise rationale and technique   Person educated: Patient Education method: Explanation, Demonstration, Tactile cues, Verbal cues, Education comprehension: verbalized understanding, returned demonstration, verbal cues required, tactile cues required, and needs further education   HOME EXERCISE PROGRAM:  has a program already   ASSESSMENT:  CLINICAL IMPRESSION: The patient was  able to complete all activities today but had some difficulty with balance and coordination.  She reported mod to max assist at times for balance.  We worked more on static balance today than previous visits.  Continue to work on walking.  Therapy will continue to advance the patient as tolerated.  We also worked on sitting balance today. OBJECTIVE IMPAIRMENTS Abnormal gait, decreased activity tolerance, decreased balance,  decreased mobility, difficulty walking, decreased strength, impaired tone, and pain.   ACTIVITY LIMITATIONS carrying, lifting, bending, standing, squatting, stairs, transfers, bed mobility, dressing, and locomotion level  PARTICIPATION LIMITATIONS: meal prep, cleaning, laundry, driving, shopping, community activity, and yard work  PERSONAL FACTORS Fibromyalgia; headaches, LBBB; osteopenia  are also affecting patient's functional outcome.   REHAB POTENTIAL: Good  CLINICAL DECISION MAKING: Evolving/moderate complexity increasing pain in her quads   EVALUATION COMPLEXITY: Moderate   GOALS: Goals reviewed with patient? Yes  SHORT TERM GOALS: Target date: 04/18/2023  Patient will transfer sit to stand smoothly and independently  Baseline: Goal status: INITIAL  2.  Patient will increase gross strength by 5lbs  Baseline:  Goal status: INITIAL  3.  Patient will improve BERG score by 10  Baseline:  Goal status: INITIAL LONG TERM GOALS: Target date: 05/16/2023   Patient will go improve balance to CGA for all balance exercises to improve safety  Baseline:  Goal status: INITIAL  2.  Patient will report a 50% reduction in falls  Baseline:  Goal status: INITIAL  3.  Patient will have a full balance program  Baseline:  Goal status: INITIAL   PLAN: PT FREQUENCY: 2x/week  PT DURATION: 8 weeks  PLANNED INTERVENTIONS: Therapeutic exercises, Therapeutic activity, Neuromuscular re-education, Balance training, Gait training, Patient/Family education, Self Care, Joint mobilization, Stair training, Aquatic Therapy, Cryotherapy, Moist heat, Taping, Manual therapy, and Re-evaluation  PLAN FOR NEXT SESSION: work on improving sit to stand, posterior, dynamic, and static balance. Work on general conditioning. Be aware of leg pain on any given day.     Lorayne Bender PT DPT 05/09/23 8:02 AM [facility] [address] [place], [place],  [address] Phone: [phone]   Fax:  [phone]

## 2023-05-10 DIAGNOSIS — Z23 Encounter for immunization: Secondary | ICD-10-CM | POA: Diagnosis not present

## 2023-05-11 ENCOUNTER — Ambulatory Visit (HOSPITAL_BASED_OUTPATIENT_CLINIC_OR_DEPARTMENT_OTHER): Payer: Medicare Other | Admitting: Physical Therapy

## 2023-05-11 DIAGNOSIS — G20A1 Parkinson's disease without dyskinesia, without mention of fluctuations: Secondary | ICD-10-CM | POA: Diagnosis not present

## 2023-05-11 DIAGNOSIS — R2689 Other abnormalities of gait and mobility: Secondary | ICD-10-CM | POA: Diagnosis not present

## 2023-05-11 NOTE — Therapy (Signed)
OUTPATIENT PHYSICAL THERAPY LOWER EXTREMITY TREATMENT   Patient Name: Morgan [name] MRN: [id number] DOB:[birth year], 72 y.o., female Today's Date: 05/11/2023   PT End of Session - 05/11/23 1125     Visit Number 10    Number of Visits 24    Authorization Type Progress note done at [facility] 10    PT Start Time 1100    PT Stop Time 1142    PT Time Calculation (min) 42 min    Activity Tolerance Patient tolerated treatment well    Behavior During Therapy WFL for tasks assessed/performed               Past Medical History:  Diagnosis Date   Anxiety    Fibromyalgia    Headache    Hypercholesteremia    controlled on medication   Hyperlipidemia    Hypertension    no current meds   LBBB (left bundle branch block)    Melanoma (HCC)    MVP (mitral valve prolapse)    Osteopenia    Parkinson disease    Pneumonia    SVD (spontaneous vaginal delivery)    x 2   Past Surgical History:  Procedure Laterality Date   BLEPHAROPLASTY     cold knife conization     COLONOSCOPY     FH colon CA   CYSTOSCOPY W/ URETERAL STENT PLACEMENT Left 10/18/2021   Procedure: CYSTOSCOPY WITH RETROGRADE PYELOGRAM, URETERAL STENT PLACEMENT;  Surgeon: Jerilee Field, MD;  Location: [facility];  Service: Urology;  Laterality: Left;   DILATATION & CURETTAGE/HYSTEROSCOPY WITH MYOSURE N/A 02/14/2018   Procedure: ATTEMPTED DILATATION & CURETTAGE/HYSTEROSCOPY WITH MYOSURE;  Surgeon: Gerald Leitz, MD;  Location: [facility];  Service: Gynecology;  Laterality: N/A;  polyp   melanoma cancer     removed from left leg   RECONSTRUCTION OF EYELID     TONSILLECTOMY     TUBAL LIGATION     URETEROSCOPY WITH HOLMIUM LASER LITHOTRIPSY Left 12/17/2021   Procedure: URETEROSCOPY/HOLMIUM LASER/STENT EXCHANGE;  Surgeon: Jerilee Field, MD;  Location: [facility];  Service: Urology;  Laterality: Left;   WISDOM TOOTH EXTRACTION     Patient Active Problem List   Diagnosis Date Noted   COVID-19 virus infection 05/16/2022   HTN  (hypertension) 10/20/2021   Physical deconditioning 10/20/2021   Hydronephrosis with infection 10/18/2021   Bacteremia due to Escherichia coli 10/18/2021   Severe sepsis (HCC) 10/17/2021   UTI (urinary tract infection) 10/17/2021   Acute metabolic encephalopathy 10/17/2021   Parkinson disease 10/17/2021   Cervical stenosis (uterine cervix) 02/14/2018   Postmenopausal bleeding 02/14/2018   LBBB (left bundle branch block) 12/25/2015   PVCs (premature ventricular contractions) 12/25/2015   Hyperlipidemia 12/25/2015   Fibromyalgia 01/16/2014   Cholelithiasis 04/25/2013   Paralysis agitans 01/15/2013   Unspecified hereditary and idiopathic peripheral neuropathy 01/15/2013   Progress Note Reporting Period  03/20/2023 to 05/11/2023  See note below for Objective Data and Assessment of Progress/Goals.       [name] MD PCP:   Cindi Carbon PROVIDER: Antoine Primas MD   REFERRING DIAG: Parkinsons   THERAPY DIAG:  No diagnosis found.  Rationale for Evaluation and Treatment Rehabilitation  ONSET DATE: Several years   SUBJECTIVE:   SUBJECTIVE STATEMENT: Patient's husband reports that she has been givin an extra half a pill which has made her more alert but has decreased balance.  She reports everything else is about the same PERTINENT HISTORY: Fibromyalgia; headaches, LBBB; osteopenia   PAIN:  Are you having pain? no: NPRS  scale:  Location:  Pain description:    Aggravating factors: just comes and goes  Relieving factors: just comes and goes; sometimes ibuprofen works but she is limited as to how much she can take  PRECAUTIONS: Patient reports that because of her heart she will pass out at times.   WEIGHT BEARING RESTRICTIONS No  FALLS:  Has patient fallen in last 6 months? Yes.  6 falls but likley more. Last fall    LIVING ENVIRONMENT: Stairs to the second floor in her house but she dosen't do much  4 steps in the front  2 steps in the garage  OCCUPATION: retired    Presenter, broadcasting: reading; goes to an exercise class   PLOF: Independent with household mobility with device  PATIENT GOALS  To have less pain in her legs and to move better    OBJECTIVE:  (measurements taken at evaluation unless otherwise noted)   DIAGNOSTIC FINDINGS:   PATIENT SURVEYS:  FOTO    COGNITION:  Overall cognitive status: Within functional limits for tasks assessed     SENSATION: Denies paresthesias   POSTURE: No Significant postural limitations  PALPATION: No trigger points noted   LOWER EXTREMITY ROM:  Passive ROM Right eval Left eval  Hip flexion    Hip extension    Hip abduction    Hip adduction    Hip internal rotation    Hip external rotation    Knee flexion    Knee extension    Ankle dorsiflexion    Ankle plantarflexion    Ankle inversion    Ankle eversion     (Blank rows = not tested)  WNL  LOWER EXTREMITY MMT:  MMT Right eval Left eval Right  7/22 Left  7/2 Right  9/12 Left 9/12  Hip flexion 21.7 29.7 23.5 25.9 24.6 25.6  Hip extension        Hip abduction 27.6 18.5 19.1 8.7 21.2 14.4  Hip adduction        Hip internal rotation        Hip external rotation        Knee flexion        Knee extension 23.5 20.2 17.7 16.8 22.7 16.3  Ankle dorsiflexion        Ankle plantarflexion        Ankle inversion        Ankle eversion         (Blank rows = not tested)  GAIT: Decreased hip flexion; leans to the left; right hip frop. Patient required CGA while walking and min a when she turned for balance. No change from IE   Functional tests:  Sit to stand: cuing to catch balance. Uses momentum to stand.    5x sit to stand Balance: 13 sec   9/12 5x sit to stand 13 sec    Narrow base min a  Narrow base eyes closed min to mod a  Tandem stance Min a both ways.  Single leg stance 5 sec bilateral       BERG Balance Test          Date:   Sit to Stand 1 2  Standing unsupported 3 3  Sitting with back unsupported but feet  supported 1 3  Stand to sit  1 2  Transfers  2 1  Standing unsupported with eyes closed 3 3  Standing unsupported feet together 3 3  From standing position, reach forward with outstretched arm 1 2  From standing position, pick up  object from floor 1 2  From standing position, turn and look behind over each shoulder 1 2  Turn 360 2 1  Standing unsupported, alternately place foot on step 1 1  Standing unsupported, one foot in front 1 1  Standing on one leg 0 0  Total:     21/30                                                                                                         26/56    TODAY'S TREATMENT: 9/12  BERG testing 3 min walk test  5x sit to stand  MMT   4 inch toe touching 2x15 4 inch step up 2x10 each leg  4 inch lateral step ups 2x10 each leg     9/10 Gait: long strides 50'x2  Marching 50'x2  Side steps 50'x4 with seated rest breaks  Increased assistance required today for balance mod to max assist at times.  Gait  Single hurdle step over 2 x 15  Seated: Bilateral press 2 x 15 Bilateral punch 3 x 15 Bilateral curl 2 x 15 All performed with 1 pound weight with contact-guard assist for balance and sitting.  Patient sitting back unsupported.  Airex: Rock/back x 20 min assist for balance Narrow base 3 x 20 seconds eyes open 3 x 20 seconds eyes closed  9/5 Gait: long strides 50'x2  Marching 50'x2  Walking with head turns 30'x2  Walking looking up and down 30'x2  Hurdles 3 laps 6 4 hurdles side-to-side  Standing: march 3x15  Heel/toe 3x15  Squat 3x15    Coordination   Fwd fwd back back with rhythm x20   Sit to stand 5x    9/3  Laq 3x10 bilateral red  March 3x15 bilateral red  Hip abdcution 3x15 bilateral red   Mod cuing for range   Shoulder press 1 lb 2x15  Shoulder punch 2x10 bilateral biceps curl 3x15 1 lb   Stnading weight shift x20    8/29 Gait: long strides 50'x2  Marching 50'x2  Walking with head turns 30'x2  Walking  looking up and down 30'x2  Hurdles 3 laps 6 4 hurdles side-to-side   Sit to stand transfers 3x4 after the first set we raised the table. She did better with posterior balance and controlled decent. Cuing for controlled decent.   8/26 NuStep 5 minutes  Air-ex rock: mod a 1 min  Air-ex normal base 3x30 sec CGA -> mod A Marching on Airex 2 x 10 some difficulty lifting her legs  Hurdles 3 laps 6 4 hurdles side-to-side  Step up 2x10 4 inch  Lateral step up 2x10 4 inch   Sit to stand transfers 3x4 after the first set we raised the table. She did better with posterior balance and controlled decent. Cuing for controlled decent.   8/22 NuStep 5 minutes  Air-ex rock: mod a 1 min  Air-ex normal base 3x30 sec CGA -> mod A Marching on Airex 2 x 10 some difficulty lifting her legs  Hurdles 3 laps 6 4 hurdles side-to-side Step  over hurdle and back 2 x 10  Patient required min to mod assist for all sit to stand transfers today.  Bicep curls 2 x 10 1 pounds D2 shoulder flexion 2 x 10 1 pound Punches 2 x 10 1 pound  LAQ 2 x 10  8/12 Nu-step 5 min   Sit to stand 3x3  improved tehcnoque with each trial. Posteiror loss of balance noted SBA - Max a   Air-ex rock: mod a 1 min  Air-ex normal base 3x30 sec CGA -> mod A   Coordination:   2 steps fwd 2 steps back 2x10 3 SF 3 SB 2x10   2 side steps 2x10   Seated balance:   Double arm press 1lb 2x10  Single arm press alt 2x10  Leg kicks with cuing to keep it slow 2x10   Hurdle tep over x10       PATIENT EDUCATION:  Education details: exercise rationale and technique   Person educated: Patient Education method: Explanation, Demonstration, Tactile cues, Verbal cues, Education comprehension: verbalized understanding, returned demonstration, verbal cues required, tactile cues required, and needs further education   HOME EXERCISE PROGRAM:  has a program already   ASSESSMENT:  CLINICAL IMPRESSION: The patient has  progressed. She has improved balance on BERG testing. She had a slight decrease in sit to stand test time. We were able to complete her 3 min walk test today. By the end of the 3 minute walk test, she required mod a for balance. She continues to require a high level of skilled therapy for balance and activity progression. She has tried to work with trainers recently in the gym and was unable 2nd to safety issues. We will continue 2W5 to work on balance and coordination exercises. Her strength testing was done at the end but still sightly improved.    OBJECTIVE IMPAIRMENTS Abnormal gait, decreased activity tolerance, decreased balance, decreased mobility, difficulty walking, decreased strength, impaired tone, and pain.   ACTIVITY LIMITATIONS carrying, lifting, bending, standing, squatting, stairs, transfers, bed mobility, dressing, and locomotion level  PARTICIPATION LIMITATIONS: meal prep, cleaning, laundry, driving, shopping, community activity, and yard work  PERSONAL FACTORS Fibromyalgia; headaches, LBBB; osteopenia  are also affecting patient's functional outcome.   REHAB POTENTIAL: Good  CLINICAL DECISION MAKING: Evolving/moderate complexity increasing pain in her quads   EVALUATION COMPLEXITY: Moderate   GOALS: Goals reviewed with patient? Yes  SHORT TERM GOALS: Target date: 04/18/2023  Patient will transfer sit to stand smoothly and independently  Baseline: Goal status: INITIAL  2.  Patient will increase gross strength by 5lbs  Baseline:  Goal status: INITIAL  3.  Patient will improve BERG score by 10  Baseline:  Goal status: INITIAL LONG TERM GOALS: Target date: 05/16/2023   Patient will go improve balance to CGA for all balance exercises to improve safety  Baseline:  Goal status: INITIAL  2.  Patient will report a 50% reduction in falls  Baseline:  Goal status: INITIAL  3.  Patient will have a full balance program  Baseline:  Goal status: INITIAL   PLAN: PT  FREQUENCY: 2x/week  PT DURATION: 8 weeks  PLANNED INTERVENTIONS: Therapeutic exercises, Therapeutic activity, Neuromuscular re-education, Balance training, Gait training, Patient/Family education, Self Care, Joint mobilization, Stair training, Aquatic Therapy, Cryotherapy, Moist heat, Taping, Manual therapy, and Re-evaluation  PLAN FOR NEXT SESSION: work on improving sit to stand, posterior, dynamic, and static balance. Work on general conditioning. Be aware of leg pain on any given day.  Lorayne Bender PT DPT 05/11/23 11:26 AM [facility] [address] [place], [place], [address] Phone: [phone]   Fax:  [phone]

## 2023-05-15 ENCOUNTER — Encounter (HOSPITAL_BASED_OUTPATIENT_CLINIC_OR_DEPARTMENT_OTHER): Payer: Self-pay | Admitting: Physical Therapy

## 2023-05-15 ENCOUNTER — Ambulatory Visit (HOSPITAL_BASED_OUTPATIENT_CLINIC_OR_DEPARTMENT_OTHER): Payer: Medicare Other | Admitting: Physical Therapy

## 2023-05-15 DIAGNOSIS — R2689 Other abnormalities of gait and mobility: Secondary | ICD-10-CM | POA: Diagnosis not present

## 2023-05-15 DIAGNOSIS — G20A1 Parkinson's disease without dyskinesia, without mention of fluctuations: Secondary | ICD-10-CM | POA: Diagnosis not present

## 2023-05-15 NOTE — Therapy (Unsigned)
OUTPATIENT PHYSICAL THERAPY LOWER EXTREMITY TREATMENT   Patient Name: Morgan [name] MRN: [id number] DOB:[birth year], 72 y.o., female Today's Date: 05/16/2023   PT End of Session - 05/15/23 1123     Visit Number 11    Number of Visits 24    Date for PT Re-Evaluation 06/13/23    Authorization Type Progress note done at [facility]    PT Start Time 1106   therapist late   PT Stop Time 1147    PT Time Calculation (min) 41 min    Activity Tolerance Patient tolerated treatment well    Behavior During Therapy WFL for tasks assessed/performed                Past Medical History:  Diagnosis Date   Anxiety    Fibromyalgia    Headache    Hypercholesteremia    controlled on medication   Hyperlipidemia    Hypertension    no current meds   LBBB (left bundle branch block)    Melanoma (HCC)    MVP (mitral valve prolapse)    Osteopenia    Parkinson disease    Pneumonia    SVD (spontaneous vaginal delivery)    x 2   Past Surgical History:  Procedure Laterality Date   BLEPHAROPLASTY     cold knife conization     COLONOSCOPY     FH colon CA   CYSTOSCOPY W/ URETERAL STENT PLACEMENT Left 10/18/2021   Procedure: CYSTOSCOPY WITH RETROGRADE PYELOGRAM, URETERAL STENT PLACEMENT;  Surgeon: Jerilee Field, MD;  Location: [facility];  Service: Urology;  Laterality: Left;   DILATATION & CURETTAGE/HYSTEROSCOPY WITH MYOSURE N/A 02/14/2018   Procedure: ATTEMPTED DILATATION & CURETTAGE/HYSTEROSCOPY WITH MYOSURE;  Surgeon: Gerald Leitz, MD;  Location: [facility];  Service: Gynecology;  Laterality: N/A;  polyp   melanoma cancer     removed from left leg   RECONSTRUCTION OF EYELID     TONSILLECTOMY     TUBAL LIGATION     URETEROSCOPY WITH HOLMIUM LASER LITHOTRIPSY Left 12/17/2021   Procedure: URETEROSCOPY/HOLMIUM LASER/STENT EXCHANGE;  Surgeon: Jerilee Field, MD;  Location: [facility];  Service: Urology;  Laterality: Left;   WISDOM TOOTH EXTRACTION     Patient Active Problem List   Diagnosis  Date Noted   COVID-19 virus infection 05/16/2022   HTN (hypertension) 10/20/2021   Physical deconditioning 10/20/2021   Hydronephrosis with infection 10/18/2021   Bacteremia due to Escherichia coli 10/18/2021   Severe sepsis (HCC) 10/17/2021   UTI (urinary tract infection) 10/17/2021   Acute metabolic encephalopathy 10/17/2021   Parkinson disease 10/17/2021   Cervical stenosis (uterine cervix) 02/14/2018   Postmenopausal bleeding 02/14/2018   LBBB (left bundle branch block) 12/25/2015   PVCs (premature ventricular contractions) 12/25/2015   Hyperlipidemia 12/25/2015   Fibromyalgia 01/16/2014   Cholelithiasis 04/25/2013   Paralysis agitans 01/15/2013   Unspecified hereditary and idiopathic peripheral neuropathy 01/15/2013   Progress Note Reporting Period  03/20/2023 to 05/11/2023  See note below for Objective Data and Assessment of Progress/Goals.       [name] MD PCP:   Cindi Carbon PROVIDER: Antoine Primas MD   REFERRING DIAG: Parkinsons   THERAPY DIAG:  Other abnormalities of gait and mobility  Parkinson's disease without dyskinesia or fluctuating manifestations  Rationale for Evaluation and Treatment Rehabilitation  ONSET DATE: Several years   SUBJECTIVE:   SUBJECTIVE STATEMENT: The patient fell in the therapy office. She was with her caregiver. She reports she has gotten too much medication and it has thrown her  balance off.  She reported no significant injury with fall.  Per caregiver the patient was able to break her fall and landed softly on the ground.  The patient continues to have several falls a day.  She is using a walking stick for primary mobility when she should be using a walker or potentially even a wheelchair at this point for safety.  PERTINENT HISTORY: Fibromyalgia; headaches, LBBB; osteopenia   PAIN:  Are you having pain? no: NPRS scale:  Location:  Pain description:    Aggravating factors: just comes and goes  Relieving factors: just  comes and goes; sometimes ibuprofen works but she is limited as to how much she can take  PRECAUTIONS: Patient reports that because of her heart she will pass out at times.   WEIGHT BEARING RESTRICTIONS No  FALLS:  Has patient fallen in last 6 months? Yes.  6 falls but likley more. Last fall    LIVING ENVIRONMENT: Stairs to the second floor in her house but she dosen't do much  4 steps in the front  2 steps in the garage  OCCUPATION: retired   Presenter, broadcasting: reading; goes to an exercise class   PLOF: Independent with household mobility with device  PATIENT GOALS  To have less pain in her legs and to move better    OBJECTIVE:  (measurements taken at evaluation unless otherwise noted)   DIAGNOSTIC FINDINGS:   PATIENT SURVEYS:  FOTO    COGNITION:  Overall cognitive status: Within functional limits for tasks assessed     SENSATION: Denies paresthesias   POSTURE: No Significant postural limitations  PALPATION: No trigger points noted   LOWER EXTREMITY ROM:  Passive ROM Right eval Left eval  Hip flexion    Hip extension    Hip abduction    Hip adduction    Hip internal rotation    Hip external rotation    Knee flexion    Knee extension    Ankle dorsiflexion    Ankle plantarflexion    Ankle inversion    Ankle eversion     (Blank rows = not tested)  WNL  LOWER EXTREMITY MMT:  MMT Right eval Left eval Right  7/22 Left  7/2 Right  9/12 Left 9/12  Hip flexion 21.7 29.7 23.5 25.9 24.6 25.6  Hip extension        Hip abduction 27.6 18.5 19.1 8.7 21.2 14.4  Hip adduction        Hip internal rotation        Hip external rotation        Knee flexion        Knee extension 23.5 20.2 17.7 16.8 22.7 16.3  Ankle dorsiflexion        Ankle plantarflexion        Ankle inversion        Ankle eversion         (Blank rows = not tested)  GAIT: Decreased hip flexion; leans to the left; right hip frop. Patient required CGA while walking and min a when she turned for  balance. No change from IE   Functional tests:  Sit to stand: cuing to catch balance. Uses momentum to stand.    5x sit to stand Balance: 13 sec   9/12 5x sit to stand 13 sec    Narrow base min a  Narrow base eyes closed min to mod a  Tandem stance Min a both ways.  Single leg stance 5 sec bilateral  BERG Balance Test          Date:   Sit to Stand 1 2  Standing unsupported 3 3  Sitting with back unsupported but feet supported 1 3  Stand to sit  1 2  Transfers  2 1  Standing unsupported with eyes closed 3 3  Standing unsupported feet together 3 3  From standing position, reach forward with outstretched arm 1 2  From standing position, pick up object from floor 1 2  From standing position, turn and look behind over each shoulder 1 2  Turn 360 2 1  Standing unsupported, alternately place foot on step 1 1  Standing unsupported, one foot in front 1 1  Standing on one leg 0 0  Total:     21/30                                                                                                         26/56    TODAY'S TREATMENT: 9/16 Seated: Bilateral press 2 x 15 Bilateral punch 3 x 15 Bilateral curl 2 x 15 All performed with 1-2 pound weight with contact-guard assist for balance and sitting.  Patient sitting back unsupported.  Sit to stand 5x 3 sets   Airex narrow base 3 x 30 seconds Airex narrow base eyes closed 2 x 20 seconds mod assist for balance  Single hurdle step over attempted 2 x 10.  Patient had significant difficulty today.  Mod assist for balance. Standing march with upper extremity support 2 x 15 moderate cueing for hip flexion Heel toe rocking 2 x 15.  Mod cueing to go forward and back.  Mod assist for balance.    9/12  BERG testing 3 min walk test  5x sit to stand  MMT   4 inch toe touching 2x15 4 inch step up 2x10 each leg  4 inch lateral step ups 2x10 each leg     9/10 Gait: long strides 50'x2  Marching 50'x2  Side steps  50'x4 with seated rest breaks  Increased assistance required today for balance mod to max assist at times.  Gait  Single hurdle step over 2 x 15  Seated: Bilateral press 2 x 15 Bilateral punch 3 x 15 Bilateral curl 2 x 15 All performed with 1 pound weight with contact-guard assist for balance and sitting.  Patient sitting back unsupported.  Airex: Rock/back x 20 min assist for balance Narrow base 3 x 20 seconds eyes open 3 x 20 seconds eyes closed  9/5 Gait: long strides 50'x2  Marching 50'x2  Walking with head turns 30'x2  Walking looking up and down 30'x2  Hurdles 3 laps 6 4 hurdles side-to-side  Standing: march 3x15  Heel/toe 3x15  Squat 3x15    Coordination   Fwd fwd back back with rhythm x20   Sit to stand 5x    9/3  Laq 3x10 bilateral red  March 3x15 bilateral red  Hip abdcution 3x15 bilateral red   Mod cuing for range   Shoulder press 1 lb 2x15  Shoulder punch  2x10 bilateral biceps curl 3x15 1 lb   Stnading weight shift x20    8/29 Gait: long strides 50'x2  Marching 50'x2  Walking with head turns 30'x2  Walking looking up and down 30'x2  Hurdles 3 laps 6 4 hurdles side-to-side   Sit to stand transfers 3x4 after the first set we raised the table. She did better with posterior balance and controlled decent. Cuing for controlled decent.   8/26 NuStep 5 minutes  Air-ex rock: mod a 1 min  Air-ex normal base 3x30 sec CGA -> mod A Marching on Airex 2 x 10 some difficulty lifting her legs  Hurdles 3 laps 6 4 hurdles side-to-side  Step up 2x10 4 inch  Lateral step up 2x10 4 inch   Sit to stand transfers 3x4 after the first set we raised the table. She did better with posterior balance and controlled decent. Cuing for controlled decent.   8/22 NuStep 5 minutes  Air-ex rock: mod a 1 min  Air-ex normal base 3x30 sec CGA -> mod A Marching on Airex 2 x 10 some difficulty lifting her legs  Hurdles 3 laps 6 4 hurdles side-to-side Step over  hurdle and back 2 x 10  Patient required min to mod assist for all sit to stand transfers today.  Bicep curls 2 x 10 1 pounds D2 shoulder flexion 2 x 10 1 pound Punches 2 x 10 1 pound  LAQ 2 x 10  8/12 Nu-step 5 min   Sit to stand 3x3  improved tehcnoque with each trial. Posteiror loss of balance noted SBA - Max a   Air-ex rock: mod a 1 min  Air-ex normal base 3x30 sec CGA -> mod A   Coordination:   2 steps fwd 2 steps back 2x10 3 SF 3 SB 2x10   2 side steps 2x10   Seated balance:   Double arm press 1lb 2x10  Single arm press alt 2x10  Leg kicks with cuing to keep it slow 2x10   Hurdle tep over x10       PATIENT EDUCATION:  Education details: exercise rationale and technique   Person educated: Patient Education method: Explanation, Demonstration, Tactile cues, Verbal cues, Education comprehension: verbalized understanding, returned demonstration, verbal cues required, tactile cues required, and needs further education   HOME EXERCISE PROGRAM:  has a program already   ASSESSMENT:  CLINICAL IMPRESSION: The patient's balance was more limited today. She required increased assist for basic exercises and balance work. We were unable to perfrom walking activity today.She required assist transferring on and off of the nu-step. We tried to do some hurdle work but she was unable to get her feet high enough. We will discuss use of a walker with her husband next visit. We have discussed in the past.  The patient tolerated upper extremity exercises.  She required contact-guard to min assist to remain balance in sitting position.   OBJECTIVE IMPAIRMENTS Abnormal gait, decreased activity tolerance, decreased balance, decreased mobility, difficulty walking, decreased strength, impaired tone, and pain.   ACTIVITY LIMITATIONS carrying, lifting, bending, standing, squatting, stairs, transfers, bed mobility, dressing, and locomotion level  PARTICIPATION LIMITATIONS: meal prep,  cleaning, laundry, driving, shopping, community activity, and yard work  PERSONAL FACTORS Fibromyalgia; headaches, LBBB; osteopenia  are also affecting patient's functional outcome.   REHAB POTENTIAL: Good  CLINICAL DECISION MAKING: Evolving/moderate complexity increasing pain in her quads   EVALUATION COMPLEXITY: Moderate   GOALS: Goals reviewed with patient? Yes  SHORT TERM GOALS: Target date: 04/18/2023  Patient will transfer sit to stand smoothly and independently  Baseline: Goal status: depends on the day 9/12  2.  Patient will increase gross strength by 5lbs  Baseline:  Goal status: moving some 9/12  3.  Patient will improve BERG score by 10  Baseline:  Goal status: INITIAL LONG TERM GOALS: Target date: 05/16/2023   Patient will go improve balance to CGA for all balance exercises to improve safety  Baseline:  Goal status: INITIAL  2.  Patient will report a 50% reduction in falls  Baseline:  Goal status: INITIAL  3.  Patient will have a full balance program  Baseline:  Goal status: INITIAL   PLAN: PT FREQUENCY: 2x/week  PT DURATION: 8 weeks  PLANNED INTERVENTIONS: Therapeutic exercises, Therapeutic activity, Neuromuscular re-education, Balance training, Gait training, Patient/Family education, Self Care, Joint mobilization, Stair training, Aquatic Therapy, Cryotherapy, Moist heat, Taping, Manual therapy, and Re-evaluation  PLAN FOR NEXT SESSION: work on improving sit to stand, posterior, dynamic, and static balance. Work on general conditioning. Be aware of leg pain on any given day.     Lorayne Bender PT DPT 05/16/23 8:39 AM [facility] [address] [place], [place], [address] Phone: [phone]   Fax:  [phone]

## 2023-05-16 ENCOUNTER — Encounter (HOSPITAL_BASED_OUTPATIENT_CLINIC_OR_DEPARTMENT_OTHER): Payer: Self-pay | Admitting: Physical Therapy

## 2023-05-24 ENCOUNTER — Encounter (HOSPITAL_BASED_OUTPATIENT_CLINIC_OR_DEPARTMENT_OTHER): Payer: Self-pay | Admitting: Physical Therapy

## 2023-05-24 ENCOUNTER — Ambulatory Visit (HOSPITAL_BASED_OUTPATIENT_CLINIC_OR_DEPARTMENT_OTHER): Payer: Medicare Other | Admitting: Physical Therapy

## 2023-05-24 DIAGNOSIS — R2689 Other abnormalities of gait and mobility: Secondary | ICD-10-CM | POA: Diagnosis not present

## 2023-05-24 DIAGNOSIS — G20A1 Parkinson's disease without dyskinesia, without mention of fluctuations: Secondary | ICD-10-CM | POA: Diagnosis not present

## 2023-05-24 NOTE — Therapy (Signed)
OUTPATIENT PHYSICAL THERAPY LOWER EXTREMITY TREATMENT   Patient Name: Morgan [name] MRN: [id number] DOB:October 13, 1950, 72 y.o., female Today's Date: 05/24/2023   PT End of Session - 05/24/23 1101     Visit Number 12    Number of Visits 24    Date for PT Re-Evaluation 06/13/23    Authorization Type Progress note done at [facility]    PT Start Time 1101    PT Stop Time 1140    PT Time Calculation (min) 39 min    Activity Tolerance Patient tolerated treatment well    Behavior During Therapy WFL for tasks assessed/performed                Past Medical History:  Diagnosis Date   Anxiety    Fibromyalgia    Headache    Hypercholesteremia    controlled on medication   Hyperlipidemia    Hypertension    no current meds   LBBB (left bundle branch block)    Melanoma (HCC)    MVP (mitral valve prolapse)    Osteopenia    Parkinson disease    Pneumonia    SVD (spontaneous vaginal delivery)    x 2   Past Surgical History:  Procedure Laterality Date   BLEPHAROPLASTY     cold knife conization     COLONOSCOPY     FH colon CA   CYSTOSCOPY W/ URETERAL STENT PLACEMENT Left 10/18/2021   Procedure: CYSTOSCOPY WITH RETROGRADE PYELOGRAM, URETERAL STENT PLACEMENT;  Surgeon: Jerilee Field, MD;  Location: [facility];  Service: Urology;  Laterality: Left;   DILATATION & CURETTAGE/HYSTEROSCOPY WITH MYOSURE N/A 02/14/2018   Procedure: ATTEMPTED DILATATION & CURETTAGE/HYSTEROSCOPY WITH MYOSURE;  Surgeon: Gerald Leitz, MD;  Location: [facility];  Service: Gynecology;  Laterality: N/A;  polyp   melanoma cancer     removed from left leg   RECONSTRUCTION OF EYELID     TONSILLECTOMY     TUBAL LIGATION     URETEROSCOPY WITH HOLMIUM LASER LITHOTRIPSY Left 12/17/2021   Procedure: URETEROSCOPY/HOLMIUM LASER/STENT EXCHANGE;  Surgeon: Jerilee Field, MD;  Location: [facility];  Service: Urology;  Laterality: Left;   WISDOM TOOTH EXTRACTION     Patient Active Problem List   Diagnosis Date Noted    COVID-19 virus infection 05/16/2022   HTN (hypertension) 10/20/2021   Physical deconditioning 10/20/2021   Hydronephrosis with infection 10/18/2021   Bacteremia due to Escherichia coli 10/18/2021   Severe sepsis (HCC) 10/17/2021   UTI (urinary tract infection) 10/17/2021   Acute metabolic encephalopathy 10/17/2021   Parkinson disease 10/17/2021   Cervical stenosis (uterine cervix) 02/14/2018   Postmenopausal bleeding 02/14/2018   LBBB (left bundle branch block) 12/25/2015   PVCs (premature ventricular contractions) 12/25/2015   Hyperlipidemia 12/25/2015   Fibromyalgia 01/16/2014   Cholelithiasis 04/25/2013   Paralysis agitans 01/15/2013   Unspecified hereditary and idiopathic peripheral neuropathy 01/15/2013   Progress Note Reporting Period  03/20/2023 to 05/11/2023  See note below for Objective Data and Assessment of Progress/Goals.       [name] MD PCP:   Cindi Carbon PROVIDER: Antoine Primas MD   REFERRING DIAG: Parkinsons   THERAPY DIAG:  Other abnormalities of gait and mobility  Parkinson's disease without dyskinesia or fluctuating manifestations  Rationale for Evaluation and Treatment Rehabilitation  ONSET DATE: Several years   SUBJECTIVE:   SUBJECTIVE STATEMENT: The patient states she feels like she is falling forward when trying to walk with walker. States she hasn't fallen, some almost falls.   PERTINENT HISTORY: Fibromyalgia;  headaches, LBBB; osteopenia   PAIN:  Are you having pain? no: NPRS scale: 4/10 Location: Legs Pain description:    Aggravating factors: just comes and goes  Relieving factors: just comes and goes; sometimes ibuprofen works but she is limited as to how much she can take  PRECAUTIONS: Patient reports that because of her heart she will pass out at times.   WEIGHT BEARING RESTRICTIONS No  FALLS:  Has patient fallen in last 6 months? Yes.  6 falls but likley more. Last fall    LIVING ENVIRONMENT: Stairs to the second floor  in her house but she dosen't do much  4 steps in the front  2 steps in the garage  OCCUPATION: retired   Presenter, broadcasting: reading; goes to an exercise class   PLOF: Independent with household mobility with device  PATIENT GOALS  To have less pain in her legs and to move better    OBJECTIVE:  (measurements taken at evaluation unless otherwise noted)   DIAGNOSTIC FINDINGS:   PATIENT SURVEYS:  FOTO    COGNITION:  Overall cognitive status: Within functional limits for tasks assessed     SENSATION: Denies paresthesias   POSTURE: No Significant postural limitations  PALPATION: No trigger points noted   LOWER EXTREMITY ROM:  Passive ROM Right eval Left eval  Hip flexion    Hip extension    Hip abduction    Hip adduction    Hip internal rotation    Hip external rotation    Knee flexion    Knee extension    Ankle dorsiflexion    Ankle plantarflexion    Ankle inversion    Ankle eversion     (Blank rows = not tested)  WNL  LOWER EXTREMITY MMT:  MMT Right eval Left eval Right  7/22 Left  7/2 Right  9/12 Left 9/12  Hip flexion 21.7 29.7 23.5 25.9 24.6 25.6  Hip extension        Hip abduction 27.6 18.5 19.1 8.7 21.2 14.4  Hip adduction        Hip internal rotation        Hip external rotation        Knee flexion        Knee extension 23.5 20.2 17.7 16.8 22.7 16.3  Ankle dorsiflexion        Ankle plantarflexion        Ankle inversion        Ankle eversion         (Blank rows = not tested)  GAIT: Decreased hip flexion; leans to the left; right hip frop. Patient required CGA while walking and min a when she turned for balance. No change from IE   Functional tests:  Sit to stand: cuing to catch balance. Uses momentum to stand.    5x sit to stand Balance: 13 sec   9/12 5x sit to stand 13 sec    Narrow base min a  Narrow base eyes closed min to mod a  Tandem stance Min a both ways.  Single leg stance 5 sec bilateral       BERG Balance Test           Date:   Sit to Stand 1 2  Standing unsupported 3 3  Sitting with back unsupported but feet supported 1 3  Stand to sit  1 2  Transfers  2 1  Standing unsupported with eyes closed 3 3  Standing unsupported feet together 3 3  From standing position,  reach forward with outstretched arm 1 2  From standing position, pick up object from floor 1 2  From standing position, turn and look behind over each shoulder 1 2  Turn 360 2 1  Standing unsupported, alternately place foot on step 1 1  Standing unsupported, one foot in front 1 1  Standing on one leg 0 0  Total:     21/30                                                                                                         26/56    TODAY'S TREATMENT: 05/24/23 Nustep 5 minutes for dynamic warm up and conditioning level 5 Row GTB 2 x 10 Shoulder extension GTB 2 x 10 STS with overhead reach 2 x 10 4 inch step taps with cueing for timing 1 x 12 bilateral Shoulder press with overhead reach 1 and 2 lb weights 2 x 10   9/16 Seated: Bilateral press 2 x 15 Bilateral punch 3 x 15 Bilateral curl 2 x 15 All performed with 1-2 pound weight with contact-guard assist for balance and sitting.  Patient sitting back unsupported.  Sit to stand 5x 3 sets   Airex narrow base 3 x 30 seconds Airex narrow base eyes closed 2 x 20 seconds mod assist for balance  Single hurdle step over attempted 2 x 10.  Patient had significant difficulty today.  Mod assist for balance. Standing march with upper extremity support 2 x 15 moderate cueing for hip flexion Heel toe rocking 2 x 15.  Mod cueing to go forward and back.  Mod assist for balance.    9/12  BERG testing 3 min walk test  5x sit to stand  MMT   4 inch toe touching 2x15 4 inch step up 2x10 each leg  4 inch lateral step ups 2x10 each leg     9/10 Gait: long strides 50'x2  Marching 50'x2  Side steps 50'x4 with seated rest breaks  Increased assistance required today for balance  mod to max assist at times.  Gait  Single hurdle step over 2 x 15  Seated: Bilateral press 2 x 15 Bilateral punch 3 x 15 Bilateral curl 2 x 15 All performed with 1 pound weight with contact-guard assist for balance and sitting.  Patient sitting back unsupported.  Airex: Rock/back x 20 min assist for balance Narrow base 3 x 20 seconds eyes open 3 x 20 seconds eyes closed  9/5 Gait: long strides 50'x2  Marching 50'x2  Walking with head turns 30'x2  Walking looking up and down 30'x2  Hurdles 3 laps 6 4 hurdles side-to-side  Standing: march 3x15  Heel/toe 3x15  Squat 3x15    Coordination   Fwd fwd back back with rhythm x20   Sit to stand 5x    9/3  Laq 3x10 bilateral red  March 3x15 bilateral red  Hip abdcution 3x15 bilateral red   Mod cuing for range   Shoulder press 1 lb 2x15  Shoulder punch 2x10 bilateral biceps curl 3x15 1 lb  Stnading weight shift x20    8/29 Gait: long strides 50'x2  Marching 50'x2  Walking with head turns 30'x2  Walking looking up and down 30'x2  Hurdles 3 laps 6 4 hurdles side-to-side   Sit to stand transfers 3x4 after the first set we raised the table. She did better with posterior balance and controlled decent. Cuing for controlled decent.   8/26 NuStep 5 minutes  Air-ex rock: mod a 1 min  Air-ex normal base 3x30 sec CGA -> mod A Marching on Airex 2 x 10 some difficulty lifting her legs  Hurdles 3 laps 6 4 hurdles side-to-side  Step up 2x10 4 inch  Lateral step up 2x10 4 inch   Sit to stand transfers 3x4 after the first set we raised the table. She did better with posterior balance and controlled decent. Cuing for controlled decent.   8/22 NuStep 5 minutes  Air-ex rock: mod a 1 min  Air-ex normal base 3x30 sec CGA -> mod A Marching on Airex 2 x 10 some difficulty lifting her legs  Hurdles 3 laps 6 4 hurdles side-to-side Step over hurdle and back 2 x 10  Patient required min to mod assist for all sit to stand  transfers today.  Bicep curls 2 x 10 1 pounds D2 shoulder flexion 2 x 10 1 pound Punches 2 x 10 1 pound  LAQ 2 x 10  8/12 Nu-step 5 min   Sit to stand 3x3  improved tehcnoque with each trial. Posteiror loss of balance noted SBA - Max a   Air-ex rock: mod a 1 min  Air-ex normal base 3x30 sec CGA -> mod A   Coordination:   2 steps fwd 2 steps back 2x10 3 SF 3 SB 2x10   2 side steps 2x10   Seated balance:   Double arm press 1lb 2x10  Single arm press alt 2x10  Leg kicks with cuing to keep it slow 2x10   Hurdle tep over x10       PATIENT EDUCATION:  Education details: exercise rationale and technique   Person educated: Patient Education method: Explanation, Demonstration, Tactile cues, Verbal cues, Education comprehension: verbalized understanding, returned demonstration, verbal cues required, tactile cues required, and needs further education   HOME EXERCISE PROGRAM:  has a program already   ASSESSMENT:  CLINICAL IMPRESSION: Began session with nustep for dynamic warm up and conditioning. Resisted postural strengthening tolerated well. Patient given cueing throughout session for mechanics and timing of exercises with fair carry over. Moderate to high fatigue at end of session.  Educated on benefit of RW. Patient will continue to benefit from physical therapy in order to improve function and reduce impairment.    OBJECTIVE IMPAIRMENTS Abnormal gait, decreased activity tolerance, decreased balance, decreased mobility, difficulty walking, decreased strength, impaired tone, and pain.   ACTIVITY LIMITATIONS carrying, lifting, bending, standing, squatting, stairs, transfers, bed mobility, dressing, and locomotion level  PARTICIPATION LIMITATIONS: meal prep, cleaning, laundry, driving, shopping, community activity, and yard work  PERSONAL FACTORS Fibromyalgia; headaches, LBBB; osteopenia  are also affecting patient's functional outcome.   REHAB POTENTIAL:  Good  CLINICAL DECISION MAKING: Evolving/moderate complexity increasing pain in her quads   EVALUATION COMPLEXITY: Moderate   GOALS: Goals reviewed with patient? Yes  SHORT TERM GOALS: Target date: 04/18/2023  Patient will transfer sit to stand smoothly and independently  Baseline: Goal status: depends on the day 9/12  2.  Patient will increase gross strength by 5lbs  Baseline:  Goal status: moving some 9/12  3.  Patient will improve BERG score by 10  Baseline:  Goal status: INITIAL LONG TERM GOALS: Target date: 05/16/2023   Patient will go improve balance to CGA for all balance exercises to improve safety  Baseline:  Goal status: INITIAL  2.  Patient will report a 50% reduction in falls  Baseline:  Goal status: INITIAL  3.  Patient will have a full balance program  Baseline:  Goal status: INITIAL   PLAN: PT FREQUENCY: 2x/week  PT DURATION: 8 weeks  PLANNED INTERVENTIONS: Therapeutic exercises, Therapeutic activity, Neuromuscular re-education, Balance training, Gait training, Patient/Family education, Self Care, Joint mobilization, Stair training, Aquatic Therapy, Cryotherapy, Moist heat, Taping, Manual therapy, and Re-evaluation  PLAN FOR NEXT SESSION: work on improving sit to stand, posterior, dynamic, and static balance. Work on general conditioning. Be aware of leg pain on any given day.      Wyman Songster, PT 05/24/2023, 11:47 AM  [facility] [address] [place], [place], [address] Phone: [phone]   Fax:  [phone]

## 2023-05-26 ENCOUNTER — Ambulatory Visit (HOSPITAL_BASED_OUTPATIENT_CLINIC_OR_DEPARTMENT_OTHER): Payer: Medicare Other | Admitting: Physical Therapy

## 2023-06-01 ENCOUNTER — Encounter (HOSPITAL_BASED_OUTPATIENT_CLINIC_OR_DEPARTMENT_OTHER): Payer: Self-pay | Admitting: Physical Therapy

## 2023-06-01 ENCOUNTER — Ambulatory Visit (HOSPITAL_BASED_OUTPATIENT_CLINIC_OR_DEPARTMENT_OTHER): Payer: Medicare Other | Attending: Family Medicine | Admitting: Physical Therapy

## 2023-06-01 DIAGNOSIS — R2689 Other abnormalities of gait and mobility: Secondary | ICD-10-CM | POA: Diagnosis not present

## 2023-06-01 DIAGNOSIS — G20A1 Parkinson's disease without dyskinesia, without mention of fluctuations: Secondary | ICD-10-CM | POA: Diagnosis not present

## 2023-06-01 NOTE — Therapy (Signed)
OUTPATIENT PHYSICAL THERAPY LOWER EXTREMITY TREATMENT   Patient Name: Morgan [name] MRN: [id number] DOB:02/20/1951, 72 y.o., female Today's Date: 06/01/2023   PT End of Session - 06/01/23 1215     Visit Number 13    Number of Visits 24    Date for PT Re-Evaluation 06/13/23    Authorization Type Progress note done at visit 10 next at 20    PT Start Time 1145    PT Stop Time 1228    PT Time Calculation (min) 43 min    Activity Tolerance Patient tolerated treatment well    Behavior During Therapy WFL for tasks assessed/performed                 Past Medical History:  Diagnosis Date   Anxiety    Fibromyalgia    Headache    Hypercholesteremia    controlled on medication   Hyperlipidemia    Hypertension    no current meds   LBBB (left bundle branch block)    Melanoma (HCC)    MVP (mitral valve prolapse)    Osteopenia    Parkinson disease (HCC)    Pneumonia    SVD (spontaneous vaginal delivery)    x 2   Past Surgical History:  Procedure Laterality Date   BLEPHAROPLASTY     cold knife conization     COLONOSCOPY     FH colon CA   CYSTOSCOPY W/ URETERAL STENT PLACEMENT Left 10/18/2021   Procedure: CYSTOSCOPY WITH RETROGRADE PYELOGRAM, URETERAL STENT PLACEMENT;  Surgeon: Jerilee Field, MD;  Location: [facility];  Service: Urology;  Laterality: Left;   DILATATION & CURETTAGE/HYSTEROSCOPY WITH MYOSURE N/A 02/14/2018   Procedure: ATTEMPTED DILATATION & CURETTAGE/HYSTEROSCOPY WITH MYOSURE;  Surgeon: Gerald Leitz, MD;  Location: [facility];  Service: Gynecology;  Laterality: N/A;  polyp   melanoma cancer     removed from left leg   RECONSTRUCTION OF EYELID     TONSILLECTOMY     TUBAL LIGATION     URETEROSCOPY WITH HOLMIUM LASER LITHOTRIPSY Left 12/17/2021   Procedure: URETEROSCOPY/HOLMIUM LASER/STENT EXCHANGE;  Surgeon: Jerilee Field, MD;  Location: [facility];  Service: Urology;  Laterality: Left;   WISDOM TOOTH EXTRACTION     Patient Active Problem List   Diagnosis  Date Noted   COVID-19 virus infection 05/16/2022   HTN (hypertension) 10/20/2021   Physical deconditioning 10/20/2021   Hydronephrosis with infection 10/18/2021   Bacteremia due to Escherichia coli 10/18/2021   Severe sepsis (HCC) 10/17/2021   UTI (urinary tract infection) 10/17/2021   Acute metabolic encephalopathy 10/17/2021   Parkinson disease (HCC) 10/17/2021   Cervical stenosis (uterine cervix) 02/14/2018   Postmenopausal bleeding 02/14/2018   LBBB (left bundle branch block) 12/25/2015   PVCs (premature ventricular contractions) 12/25/2015   Hyperlipidemia 12/25/2015   Fibromyalgia 01/16/2014   Cholelithiasis 04/25/2013   Paralysis agitans (HCC) 01/15/2013   Hereditary and idiopathic peripheral neuropathy 01/15/2013   Progress Note Reporting Period  03/20/2023 to 05/11/2023  See note below for Objective Data and Assessment of Progress/Goals.       [name] MD PCP:   Cindi Carbon PROVIDER: Antoine Primas MD   REFERRING DIAG: Parkinsons   THERAPY DIAG:  Other abnormalities of gait and mobility  Parkinson's disease without dyskinesia or fluctuating manifestations (HCC)  Rationale for Evaluation and Treatment Rehabilitation  ONSET DATE: Several years   SUBJECTIVE:   SUBJECTIVE STATEMENT: The patient's husband reports that she has been more off balance today.  He tried to give her more medication.  He just gave it to her is not sure if it will kick in.  The patient has no complaints today. PERTINENT HISTORY: Fibromyalgia; headaches, LBBB; osteopenia   PAIN:  Are you having pain? no: NPRS scale: 4/10 Location: Legs Pain description:    Aggravating factors: just comes and goes  Relieving factors: just comes and goes; sometimes ibuprofen works but she is limited as to how much she can take  PRECAUTIONS: Patient reports that because of her heart she will pass out at times.   WEIGHT BEARING RESTRICTIONS No  FALLS:  Has patient fallen in last 6 months? Yes.  6  falls but likley more. Last fall    LIVING ENVIRONMENT: Stairs to the second floor in her house but she dosen't do much  4 steps in the front  2 steps in the garage  OCCUPATION: retired   Presenter, broadcasting: reading; goes to an exercise class   PLOF: Independent with household mobility with device  PATIENT GOALS  To have less pain in her legs and to move better    OBJECTIVE:  (measurements taken at evaluation unless otherwise noted)   DIAGNOSTIC FINDINGS:   PATIENT SURVEYS:  FOTO    COGNITION:  Overall cognitive status: Within functional limits for tasks assessed     SENSATION: Denies paresthesias   POSTURE: No Significant postural limitations  PALPATION: No trigger points noted   LOWER EXTREMITY ROM:  Passive ROM Right eval Left eval  Hip flexion    Hip extension    Hip abduction    Hip adduction    Hip internal rotation    Hip external rotation    Knee flexion    Knee extension    Ankle dorsiflexion    Ankle plantarflexion    Ankle inversion    Ankle eversion     (Blank rows = not tested)  WNL  LOWER EXTREMITY MMT:  MMT Right eval Left eval Right  7/22 Left  7/2 Right  9/12 Left 9/12  Hip flexion 21.7 29.7 23.5 25.9 24.6 25.6  Hip extension        Hip abduction 27.6 18.5 19.1 8.7 21.2 14.4  Hip adduction        Hip internal rotation        Hip external rotation        Knee flexion        Knee extension 23.5 20.2 17.7 16.8 22.7 16.3  Ankle dorsiflexion        Ankle plantarflexion        Ankle inversion        Ankle eversion         (Blank rows = not tested)  GAIT: Decreased hip flexion; leans to the left; right hip frop. Patient required CGA while walking and min a when she turned for balance. No change from IE   Functional tests:  Sit to stand: cuing to catch balance. Uses momentum to stand.    5x sit to stand Balance: 13 sec   9/12 5x sit to stand 13 sec    Narrow base min a  Narrow base eyes closed min to mod a  Tandem stance  Min a both ways.  Single leg stance 5 sec bilateral       BERG Balance Test          Date:   Sit to Stand 1 2  Standing unsupported 3 3  Sitting with back unsupported but feet supported 1 3  Stand to sit  1 2  Transfers  2 1  Standing unsupported with eyes closed 3 3  Standing unsupported feet together 3 3  From standing position, reach forward with outstretched arm 1 2  From standing position, pick up object from floor 1 2  From standing position, turn and look behind over each shoulder 1 2  Turn 360 2 1  Standing unsupported, alternately place foot on step 1 1  Standing unsupported, one foot in front 1 1  Standing on one leg 0 0  Total:     21/30                                                                                                         26/56    TODAY'S TREATMENT: 10/3 Seated: Bilateral press 2 x 15 Bilateral punch 3 x 15 Bilateral curl 2 x 15 All performed with 2 pound weight with contact-guard assist for balance and sitting.  Patient sitting back unsupported.  LAQ red 2 x 15 Hip abduction red 2 x 15 Seated march 2 x 15  Airex: Narrow base of support 2 x 20 seconds hold Tandem stance 2 x 20-second hold  Heel raises 2 rock back for balance x 20 Standing slow march for balance 2 x 10 each leg   05/24/23 Nustep 5 minutes for dynamic warm up and conditioning level 5 Row GTB 2 x 10 Shoulder extension GTB 2 x 10 STS with overhead reach 2 x 10 4 inch step taps with cueing for timing 1 x 12 bilateral Shoulder press with overhead reach 1 and 2 lb weights 2 x 10   9/16 Seated: Bilateral press 2 x 15 Bilateral punch 3 x 15 Bilateral curl 2 x 15 All performed with 1-2 pound weight with contact-guard assist for balance and sitting.  Patient sitting back unsupported.  Sit to stand 5x 3 sets   Airex narrow base 3 x 30 seconds Airex narrow base eyes closed 2 x 20 seconds mod assist for balance  Single hurdle step over attempted 2 x 10.  Patient had  significant difficulty today.  Mod assist for balance. Standing march with upper extremity support 2 x 15 moderate cueing for hip flexion Heel toe rocking 2 x 15.  Mod cueing to go forward and back.  Mod assist for balance.    9/12  BERG testing 3 min walk test  5x sit to stand  MMT   4 inch toe touching 2x15 4 inch step up 2x10 each leg  4 inch lateral step ups 2x10 each leg     9/10 Gait: long strides 50'x2  Marching 50'x2  Side steps 50'x4 with seated rest breaks  Increased assistance required today for balance mod to max assist at times.  Gait  Single hurdle step over 2 x 15  Seated: Bilateral press 2 x 15 Bilateral punch 3 x 15 Bilateral curl 2 x 15 All performed with 1 pound weight with contact-guard assist for balance and sitting.  Patient sitting back unsupported.  Airex: Rock/back x 20 min assist for balance Narrow base 3 x  20 seconds eyes open 3 x 20 seconds eyes closed  9/5 Gait: long strides 50'x2  Marching 50'x2  Walking with head turns 30'x2  Walking looking up and down 30'x2  Hurdles 3 laps 6 4 hurdles side-to-side  Standing: march 3x15  Heel/toe 3x15  Squat 3x15    Coordination   Fwd fwd back back with rhythm x20   Sit to stand 5x    9/3  Laq 3x10 bilateral red  March 3x15 bilateral red  Hip abdcution 3x15 bilateral red   Mod cuing for range   Shoulder press 1 lb 2x15  Shoulder punch 2x10 bilateral biceps curl 3x15 1 lb   Stnading weight shift x20    8/29 Gait: long strides 50'x2  Marching 50'x2  Walking with head turns 30'x2  Walking looking up and down 30'x2  Hurdles 3 laps 6 4 hurdles side-to-side   Sit to stand transfers 3x4 after the first set we raised the table. She did better with posterior balance and controlled decent. Cuing for controlled decent.   8/26 NuStep 5 minutes  Air-ex rock: mod a 1 min  Air-ex normal base 3x30 sec CGA -> mod A Marching on Airex 2 x 10 some difficulty lifting her legs  Hurdles  3 laps 6 4 hurdles side-to-side  Step up 2x10 4 inch  Lateral step up 2x10 4 inch   Sit to stand transfers 3x4 after the first set we raised the table. She did better with posterior balance and controlled decent. Cuing for controlled decent.   8/22 NuStep 5 minutes  Air-ex rock: mod a 1 min  Air-ex normal base 3x30 sec CGA -> mod A Marching on Airex 2 x 10 some difficulty lifting her legs  Hurdles 3 laps 6 4 hurdles side-to-side Step over hurdle and back 2 x 10  Patient required min to mod assist for all sit to stand transfers today.  Bicep curls 2 x 10 1 pounds D2 shoulder flexion 2 x 10 1 pound Punches 2 x 10 1 pound  LAQ 2 x 10  8/12 Nu-step 5 min   Sit to stand 3x3  improved tehcnoque with each trial. Posteiror loss of balance noted SBA - Max a   Air-ex rock: mod a 1 min  Air-ex normal base 3x30 sec CGA -> mod A   Coordination:   2 steps fwd 2 steps back 2x10 3 SF 3 SB 2x10   2 side steps 2x10   Seated balance:   Double arm press 1lb 2x10  Single arm press alt 2x10  Leg kicks with cuing to keep it slow 2x10   Hurdle tep over x10       PATIENT EDUCATION:  Education details: exercise rationale and technique   Person educated: Patient Education method: Explanation, Demonstration, Tactile cues, Verbal cues, Education comprehension: verbalized understanding, returned demonstration, verbal cues required, tactile cues required, and needs further education   HOME EXERCISE PROGRAM:  has a program already   ASSESSMENT:  CLINICAL IMPRESSION: Despite baseline mobility issues the patient was able to tolerate treatment today.  We will to work on some standing activities.  She did report fatigue towards the end.  Will also work on upper and lower extremity strengthening exercises.  Will continue to work with the patient depending on her medication levels.  Continue to work on balance and stability. OBJECTIVE IMPAIRMENTS Abnormal gait, decreased activity  tolerance, decreased balance, decreased mobility, difficulty walking, decreased strength, impaired tone, and pain.   ACTIVITY LIMITATIONS carrying, lifting,  bending, standing, squatting, stairs, transfers, bed mobility, dressing, and locomotion level  PARTICIPATION LIMITATIONS: meal prep, cleaning, laundry, driving, shopping, community activity, and yard work  PERSONAL FACTORS Fibromyalgia; headaches, LBBB; osteopenia  are also affecting patient's functional outcome.   REHAB POTENTIAL: Good  CLINICAL DECISION MAKING: Evolving/moderate complexity increasing pain in her quads   EVALUATION COMPLEXITY: Moderate   GOALS: Goals reviewed with patient? Yes  SHORT TERM GOALS: Target date: 04/18/2023  Patient will transfer sit to stand smoothly and independently  Baseline: Goal status: depends on the day 9/12  2.  Patient will increase gross strength by 5lbs  Baseline:  Goal status: moving some 9/12  3.  Patient will improve BERG score by 10  Baseline:  Goal status: INITIAL LONG TERM GOALS: Target date: 05/16/2023   Patient will go improve balance to CGA for all balance exercises to improve safety  Baseline:  Goal status: INITIAL  2.  Patient will report a 50% reduction in falls  Baseline:  Goal status: INITIAL  3.  Patient will have a full balance program  Baseline:  Goal status: INITIAL   PLAN: PT FREQUENCY: 2x/week  PT DURATION: 8 weeks  PLANNED INTERVENTIONS: Therapeutic exercises, Therapeutic activity, Neuromuscular re-education, Balance training, Gait training, Patient/Family education, Self Care, Joint mobilization, Stair training, Aquatic Therapy, Cryotherapy, Moist heat, Taping, Manual therapy, and Re-evaluation  PLAN FOR NEXT SESSION: work on improving sit to stand, posterior, dynamic, and static balance. Work on general conditioning. Be aware of leg pain on any given day.      Dessie Coma, PT 06/01/2023, 3:18 PM  [facility] [address] [place], [place], [address] Phone: [phone]   Fax:  [phone]

## 2023-06-02 ENCOUNTER — Encounter (HOSPITAL_BASED_OUTPATIENT_CLINIC_OR_DEPARTMENT_OTHER): Payer: Self-pay | Admitting: Physical Therapy

## 2023-06-02 ENCOUNTER — Ambulatory Visit (HOSPITAL_BASED_OUTPATIENT_CLINIC_OR_DEPARTMENT_OTHER): Payer: Medicare Other | Admitting: Physical Therapy

## 2023-06-02 DIAGNOSIS — G20A1 Parkinson's disease without dyskinesia, without mention of fluctuations: Secondary | ICD-10-CM | POA: Diagnosis not present

## 2023-06-02 DIAGNOSIS — R2689 Other abnormalities of gait and mobility: Secondary | ICD-10-CM | POA: Diagnosis not present

## 2023-06-02 NOTE — Progress Notes (Unsigned)
[facility] [address] [place] [address] Phone: [phone] Subjective:   Morgan [name], am serving as a scribe for Dr. Antoine Primas. I'm seeing this patient by the request  of:  [name], MD  CC: Follow-up for Parkinson's disease and deconditioning.  [id number]  03/10/2023 Patient still has a Parkinson's.  Does have some cogwheeling noted.  Mild shuffling gait.  Did do significantly better when patient was doing formal physical therapy and I do think that this can contribute and help her with her overall wellbeing.  I think also with her being active and has been significantly beneficial for the fibromyalgia.  Patient is also felt like she is using less of her Parkinson's medications.  We discussed with patient other ways to increase activity safely.  Referred back to formal physical therapy.  Follow-up in 3 months      Update 06/05/2023 Morgan [name] is a 72 y.o. female coming in with complaint of parkinson's disease. Has been doing PT. Patient states she is doing well, would like to stay in PT as it is really helping.        Past Medical History:  Diagnosis Date   Anxiety    Fibromyalgia    Headache    Hypercholesteremia    controlled on medication   Hyperlipidemia    Hypertension    no current meds   LBBB (left bundle branch block)    Melanoma (HCC)    MVP (mitral valve prolapse)    Osteopenia    Parkinson disease (HCC)    Pneumonia    SVD (spontaneous vaginal delivery)    x 2   Past Surgical History:  Procedure Laterality Date   BLEPHAROPLASTY     cold knife conization     COLONOSCOPY     FH colon CA   CYSTOSCOPY W/ URETERAL STENT PLACEMENT Left 10/18/2021   Procedure: CYSTOSCOPY WITH RETROGRADE PYELOGRAM, URETERAL STENT PLACEMENT;  Surgeon: Jerilee Field, MD;  Location: [facility];  Service: Urology;  Laterality: Left;   DILATATION & CURETTAGE/HYSTEROSCOPY WITH MYOSURE N/A 02/14/2018   Procedure:  ATTEMPTED DILATATION & CURETTAGE/HYSTEROSCOPY WITH MYOSURE;  Surgeon: Gerald Leitz, MD;  Location: [facility];  Service: Gynecology;  Laterality: N/A;  polyp   melanoma cancer     removed from left leg   RECONSTRUCTION OF EYELID     TONSILLECTOMY     TUBAL LIGATION     URETEROSCOPY WITH HOLMIUM LASER LITHOTRIPSY Left 12/17/2021   Procedure: URETEROSCOPY/HOLMIUM LASER/STENT EXCHANGE;  Surgeon: Jerilee Field, MD;  Location: [facility];  Service: Urology;  Laterality: Left;   WISDOM TOOTH EXTRACTION     Social History   Socioeconomic History   Marital status: Married    Spouse name: Not on file   Number of children: 2   Years of education: Not on file   Highest education level: Bachelor's degree (e.g., BA, AB, BS)  Occupational History   Occupation: retired    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  Tobacco Use   Smoking status: Former    Current packs/day: 0.00    Types: Cigarettes    Quit date: 09/29/1997    Years since quitting: 25.7   Smokeless tobacco: Never   Tobacco comments:    chews Nicoret[place], 10-12/day  Vaping Use   Vaping status: Never Used  Substance and Sexual Activity   Alcohol use: Yes    Comment: Occasional wine   Drug use: No   Sexual activity: Not on file  Other Topics  Concern   Not on file  Social History Narrative   right handed   2 story home    Lives with spouse   Social Determinants of Health   Financial Resource Strain: Not on file  Food Insecurity: No Food Insecurity (05/17/2022)   Hunger Vital Sign    Worried About Running Out of Food in the Last Year: Never true    Ran Out of Food in the Last Year: Never true  Transportation Needs: No Transportation Needs (05/31/2022)   PRAPARE - Administrator, Civil Service (Medical): No    Lack of Transportation (Non-Medical): No  Physical Activity: Not on file  Stress: Not on file  Social Connections: Not on file   Allergies  Allergen Reactions   Other Anaphylaxis    "Anti Seizure Medications"    Phenergan [Promethazine Hcl] Other (See Comments)    Makes Parkinson's worse   Acetaminophen Other (See Comments)    "I just get weird"   Itraconazole Hives   Tamsulosin Other (See Comments)    B/P DROPPED TOO MUCH!!   Family History  Problem Relation Age of Onset   Cancer Mother        colon   Healthy Brother    Healthy Sister    Healthy Child     Current Outpatient Medications (Endocrine & Metabolic):    raloxifene (EVISTA) 60 MG tablet, Take 60 mg by mouth in the morning.  Current Outpatient Medications (Cardiovascular):    atorvastatin (LIPITOR) 10 MG tablet, Take 10 mg by mouth in the morning.   midodrine (PROAMATINE) 2.5 MG tablet, Take 1 tablet (2.5 mg total) by mouth 3 (three) times daily as needed (for systolic blood pressure less than 100).   amLODipine (NORVASC) 2.5 MG tablet, Take 1 tablet (2.5 mg total) by mouth daily.   Current Outpatient Medications (Analgesics):    aspirin EC 81 MG tablet, Take 81 mg by mouth daily. Swallow whole.   Ibuprofen 200 MG CAPS, Take 2 capsules (400 mg total) by mouth every 8 (eight) hours as needed (Leg pain, headache or mild pain).   Current Outpatient Medications (Other):    ALPRAZolam (XANAX) 0.25 MG tablet, Take 0.25 mg by mouth daily as needed for anxiety.    BLINK TEARS 0.25 % SOLN, Place 1 drop into both eyes 3 (three) times daily as needed (for dryness).   carbidopa-levodopa (SINEMET IR) 25-100 MG tablet, 1 tablet every 2 hours starting at 6-7am (getting about 6-7 in per day)The medication have a score line so it can be broken.   Cholecalciferol (VITAMIN D3) 25 MCG (1000 UT) CHEW, Chew 2,500 Units by mouth daily.   Coenzyme Q10-Vitamin E (QUNOL ULTRA COQ10 PO), Take 1 capsule by mouth daily.   CYMBALTA 60 MG capsule, TAKE 1 CAPSULE EVERY       MORNING   gabapentin (NEURONTIN) 400 MG capsule, Take 1 capsule (400 mg total) by mouth 3 (three) times daily as needed (for leg pain or soreness).   Levodopa (INBRIJA) 42 MG CAPS, You  can inhale the capsules as needed up to 5 times per day, separated by 2 hour intervals.   methocarbamol (ROBAXIN) 750 MG tablet, Take 750 mg by mouth every 6 (six) hours as needed for muscle spasms (or leg pain).   Multiple Vitamins-Minerals (PRESERVISION AREDS 2 PO), Take 1 capsule by mouth 2 (two) times daily.   Pramipexole Dihydrochloride 0.75 MG TB24, TAKE 1 TABLET DAILY   ramelteon (ROZEREM) 8 MG tablet, TAKE 1 TABLET  AT BEDTIME.   TART CHERRY PO, Take 3,000 mg by mouth daily.   Reviewed prior external information including notes and imaging from  primary care provider As well as notes that were available from care everywhere and other healthcare systems.  Past medical history, social, surgical and family history all reviewed in electronic medical record.  No pertanent information unless stated regarding to the chief complaint.   Review of Systems:  No headache, visual changes, nausea, vomiting, diarrhea, constipation, dizziness, abdominal pain, skin rash, fevers, chills, night sweats, weight loss, swollen lymph nodes, body aches, joint swelling, chest pain, shortness of breath, mood changes. POSITIVE muscle aches  Objective  Blood pressure 122/82, pulse 79, height 5\' 4"  (1.626 m), SpO2 99%.   General: No apparent distress alert and oriented x3 mood and affect normal, dressed appropriately. Very mild masked facies much more smiling  HEENT: Pupils equal, extraocular movements intact  Respiratory: Patient's speak in full sentences and does not appear short of breath  Cardiovascular: No lower extremity edema, non tender, no erythema  Patient has very mild rigidity noted at the moment.  Improvement in walking but still has some mild shuffling gait noted.  Sitting much more comfortably in the chair though.    Impression and Recommendations:    The above documentation has been reviewed and is accurate and complete Judi Saa, DO

## 2023-06-02 NOTE — Therapy (Signed)
OUTPATIENT PHYSICAL THERAPY LOWER EXTREMITY TREATMENT   Patient Name: Morgan [name] MRN: [id number] DOB:1951-01-17, 72 y.o., female Today's Date: 06/02/2023   PT End of Session - 06/02/23 1012     Visit Number 14    Number of Visits 24    Date for PT Re-Evaluation 06/13/23    Authorization Type Progress note done at visit 10 next at 20    PT Start Time 1015    PT Stop Time 1055    PT Time Calculation (min) 40 min    Activity Tolerance Patient tolerated treatment well    Behavior During Therapy WFL for tasks assessed/performed                 Past Medical History:  Diagnosis Date   Anxiety    Fibromyalgia    Headache    Hypercholesteremia    controlled on medication   Hyperlipidemia    Hypertension    no current meds   LBBB (left bundle branch block)    Melanoma (HCC)    MVP (mitral valve prolapse)    Osteopenia    Parkinson disease (HCC)    Pneumonia    SVD (spontaneous vaginal delivery)    x 2   Past Surgical History:  Procedure Laterality Date   BLEPHAROPLASTY     cold knife conization     COLONOSCOPY     FH colon CA   CYSTOSCOPY W/ URETERAL STENT PLACEMENT Left 10/18/2021   Procedure: CYSTOSCOPY WITH RETROGRADE PYELOGRAM, URETERAL STENT PLACEMENT;  Surgeon: Jerilee Field, MD;  Location: [facility];  Service: Urology;  Laterality: Left;   DILATATION & CURETTAGE/HYSTEROSCOPY WITH MYOSURE N/A 02/14/2018   Procedure: ATTEMPTED DILATATION & CURETTAGE/HYSTEROSCOPY WITH MYOSURE;  Surgeon: Gerald Leitz, MD;  Location: [facility];  Service: Gynecology;  Laterality: N/A;  polyp   melanoma cancer     removed from left leg   RECONSTRUCTION OF EYELID     TONSILLECTOMY     TUBAL LIGATION     URETEROSCOPY WITH HOLMIUM LASER LITHOTRIPSY Left 12/17/2021   Procedure: URETEROSCOPY/HOLMIUM LASER/STENT EXCHANGE;  Surgeon: Jerilee Field, MD;  Location: [facility];  Service: Urology;  Laterality: Left;   WISDOM TOOTH EXTRACTION     Patient Active Problem List   Diagnosis  Date Noted   COVID-19 virus infection 05/16/2022   HTN (hypertension) 10/20/2021   Physical deconditioning 10/20/2021   Hydronephrosis with infection 10/18/2021   Bacteremia due to Escherichia coli 10/18/2021   Severe sepsis (HCC) 10/17/2021   UTI (urinary tract infection) 10/17/2021   Acute metabolic encephalopathy 10/17/2021   Parkinson disease (HCC) 10/17/2021   Cervical stenosis (uterine cervix) 02/14/2018   Postmenopausal bleeding 02/14/2018   LBBB (left bundle branch block) 12/25/2015   PVCs (premature ventricular contractions) 12/25/2015   Hyperlipidemia 12/25/2015   Fibromyalgia 01/16/2014   Cholelithiasis 04/25/2013   Paralysis agitans (HCC) 01/15/2013   Hereditary and idiopathic peripheral neuropathy 01/15/2013     [name] MD PCP:   Cindi Carbon PROVIDER: Antoine Primas MD   REFERRING DIAG: Parkinsons   THERAPY DIAG:  Other abnormalities of gait and mobility  Parkinson's disease without dyskinesia or fluctuating manifestations (HCC)  Rationale for Evaluation and Treatment Rehabilitation  ONSET DATE: Several years   SUBJECTIVE:   SUBJECTIVE STATEMENT: Patient states more off balance this week.    PERTINENT HISTORY: Fibromyalgia; headaches, LBBB; osteopenia   PAIN:  Are you having pain? no: NPRS scale: 4/10 Location: Legs Pain description:    Aggravating factors: just comes and goes  Relieving factors:  just comes and goes; sometimes ibuprofen works but she is limited as to how much she can take  PRECAUTIONS: Patient reports that because of her heart she will pass out at times.   WEIGHT BEARING RESTRICTIONS No  FALLS:  Has patient fallen in last 6 months? Yes.  6 falls but likley more. Last fall    LIVING ENVIRONMENT: Stairs to the second floor in her house but she dosen't do much  4 steps in the front  2 steps in the garage  OCCUPATION: retired   Presenter, broadcasting: reading; goes to an exercise class   PLOF: Independent with household mobility with  device  PATIENT GOALS  To have less pain in her legs and to move better    OBJECTIVE:  (measurements taken at evaluation unless otherwise noted)   DIAGNOSTIC FINDINGS:   PATIENT SURVEYS:  FOTO    COGNITION:  Overall cognitive status: Within functional limits for tasks assessed     SENSATION: Denies paresthesias   POSTURE: No Significant postural limitations  PALPATION: No trigger points noted   LOWER EXTREMITY ROM:  Passive ROM Right eval Left eval  Hip flexion    Hip extension    Hip abduction    Hip adduction    Hip internal rotation    Hip external rotation    Knee flexion    Knee extension    Ankle dorsiflexion    Ankle plantarflexion    Ankle inversion    Ankle eversion     (Blank rows = not tested)  WNL  LOWER EXTREMITY MMT:  MMT Right eval Left eval Right  7/22 Left  7/2 Right  9/12 Left 9/12  Hip flexion 21.7 29.7 23.5 25.9 24.6 25.6  Hip extension        Hip abduction 27.6 18.5 19.1 8.7 21.2 14.4  Hip adduction        Hip internal rotation        Hip external rotation        Knee flexion        Knee extension 23.5 20.2 17.7 16.8 22.7 16.3  Ankle dorsiflexion        Ankle plantarflexion        Ankle inversion        Ankle eversion         (Blank rows = not tested)  GAIT: Decreased hip flexion; leans to the left; right hip frop. Patient required CGA while walking and min a when she turned for balance. No change from IE   Functional tests:  Sit to stand: cuing to catch balance. Uses momentum to stand.    5x sit to stand Balance: 13 sec   9/12 5x sit to stand 13 sec    Narrow base min a  Narrow base eyes closed min to mod a  Tandem stance Min a both ways.  Single leg stance 5 sec bilateral       BERG Balance Test          Date:   Sit to Stand 1 2  Standing unsupported 3 3  Sitting with back unsupported but feet supported 1 3  Stand to sit  1 2  Transfers  2 1  Standing unsupported with eyes closed 3 3  Standing  unsupported feet together 3 3  From standing position, reach forward with outstretched arm 1 2  From standing position, pick up object from floor 1 2  From standing position, turn and look behind over each shoulder 1 2  Turn 360 2 1  Standing unsupported, alternately place foot on step 1 1  Standing unsupported, one foot in front 1 1  Standing on one leg 0 0  Total:     21/30                                                                                                         26/56    TODAY'S TREATMENT: 06/02/23 Nustep 5 minutes for dynamic warm up and conditioning level 5 Step taps 4 inch 2 x 10 STS with overhead reach 3 x 10 Row GTB 2 x 15 Shoulder extension GTB 2 x 15 Forward step with overhead reach 2 x 10 bilateral Bilateral shoulder press in seated 2# 3 x 10 Bicep curl 2# 2 x 15  10/3 Seated: Bilateral press 2 x 15 Bilateral punch 3 x 15 Bilateral curl 2 x 15 All performed with 2 pound weight with contact-guard assist for balance and sitting.  Patient sitting back unsupported.  LAQ red 2 x 15 Hip abduction red 2 x 15 Seated march 2 x 15  Airex: Narrow base of support 2 x 20 seconds hold Tandem stance 2 x 20-second hold  Heel raises 2 rock back for balance x 20 Standing slow march for balance 2 x 10 each leg   05/24/23 Nustep 5 minutes for dynamic warm up and conditioning level 5 Row GTB 2 x 10 Shoulder extension GTB 2 x 10 STS with overhead reach 2 x 10 4 inch step taps with cueing for timing 1 x 12 bilateral Shoulder press with overhead reach 1 and 2 lb weights 2 x 10   9/16 Seated: Bilateral press 2 x 15 Bilateral punch 3 x 15 Bilateral curl 2 x 15 All performed with 1-2 pound weight with contact-guard assist for balance and sitting.  Patient sitting back unsupported.  Sit to stand 5x 3 sets   Airex narrow base 3 x 30 seconds Airex narrow base eyes closed 2 x 20 seconds mod assist for balance  Single hurdle step over attempted 2 x 10.  Patient  had significant difficulty today.  Mod assist for balance. Standing march with upper extremity support 2 x 15 moderate cueing for hip flexion Heel toe rocking 2 x 15.  Mod cueing to go forward and back.  Mod assist for balance.    9/12  BERG testing 3 min walk test  5x sit to stand  MMT   4 inch toe touching 2x15 4 inch step up 2x10 each leg  4 inch lateral step ups 2x10 each leg     9/10 Gait: long strides 50'x2  Marching 50'x2  Side steps 50'x4 with seated rest breaks  Increased assistance required today for balance mod to max assist at times.  Gait  Single hurdle step over 2 x 15  Seated: Bilateral press 2 x 15 Bilateral punch 3 x 15 Bilateral curl 2 x 15 All performed with 1 pound weight with contact-guard assist for balance and sitting.  Patient sitting back unsupported.  Airex: Rock/back x 20 min  assist for balance Narrow base 3 x 20 seconds eyes open 3 x 20 seconds eyes closed  9/5 Gait: long strides 50'x2  Marching 50'x2  Walking with head turns 30'x2  Walking looking up and down 30'x2  Hurdles 3 laps 6 4 hurdles side-to-side  Standing: march 3x15  Heel/toe 3x15  Squat 3x15    Coordination   Fwd fwd back back with rhythm x20   Sit to stand 5x    9/3  Laq 3x10 bilateral red  March 3x15 bilateral red  Hip abdcution 3x15 bilateral red   Mod cuing for range   Shoulder press 1 lb 2x15  Shoulder punch 2x10 bilateral biceps curl 3x15 1 lb   Stnading weight shift x20    8/29 Gait: long strides 50'x2  Marching 50'x2  Walking with head turns 30'x2  Walking looking up and down 30'x2  Hurdles 3 laps 6 4 hurdles side-to-side   Sit to stand transfers 3x4 after the first set we raised the table. She did better with posterior balance and controlled decent. Cuing for controlled decent.   8/26 NuStep 5 minutes  Air-ex rock: mod a 1 min  Air-ex normal base 3x30 sec CGA -> mod A Marching on Airex 2 x 10 some difficulty lifting her  legs  Hurdles 3 laps 6 4 hurdles side-to-side  Step up 2x10 4 inch  Lateral step up 2x10 4 inch   Sit to stand transfers 3x4 after the first set we raised the table. She did better with posterior balance and controlled decent. Cuing for controlled decent.   8/22 NuStep 5 minutes  Air-ex rock: mod a 1 min  Air-ex normal base 3x30 sec CGA -> mod A Marching on Airex 2 x 10 some difficulty lifting her legs  Hurdles 3 laps 6 4 hurdles side-to-side Step over hurdle and back 2 x 10  Patient required min to mod assist for all sit to stand transfers today.  Bicep curls 2 x 10 1 pounds D2 shoulder flexion 2 x 10 1 pound Punches 2 x 10 1 pound  LAQ 2 x 10  8/12 Nu-step 5 min   Sit to stand 3x3  improved tehcnoque with each trial. Posteiror loss of balance noted SBA - Max a   Air-ex rock: mod a 1 min  Air-ex normal base 3x30 sec CGA -> mod A   Coordination:   2 steps fwd 2 steps back 2x10 3 SF 3 SB 2x10   2 side steps 2x10   Seated balance:   Double arm press 1lb 2x10  Single arm press alt 2x10  Leg kicks with cuing to keep it slow 2x10   Hurdle tep over x10       PATIENT EDUCATION:  Education details: exercise rationale and technique   Person educated: Patient Education method: Explanation, Demonstration, Tactile cues, Verbal cues, Education comprehension: verbalized understanding, returned demonstration, verbal cues required, tactile cues required, and needs further education   HOME EXERCISE PROGRAM:  has a program already   ASSESSMENT:  CLINICAL IMPRESSION: Began session on nustep for dynamic warm up and conditioning. Patient requiring CGA to min A with ambulation for balance with walking stick and also assist for balance with standing exercises. Cueing for timing with exercises and mechanics with good/fair carry over. Tolerates increased reps of previously completed exercises. Remains at an increased risk for falls. Patient will continue to benefit from  physical therapy in order to improve function and reduce impairment.    OBJECTIVE IMPAIRMENTS Abnormal gait,  decreased activity tolerance, decreased balance, decreased mobility, difficulty walking, decreased strength, impaired tone, and pain.   ACTIVITY LIMITATIONS carrying, lifting, bending, standing, squatting, stairs, transfers, bed mobility, dressing, and locomotion level  PARTICIPATION LIMITATIONS: meal prep, cleaning, laundry, driving, shopping, community activity, and yard work  PERSONAL FACTORS Fibromyalgia; headaches, LBBB; osteopenia  are also affecting patient's functional outcome.   REHAB POTENTIAL: Good  CLINICAL DECISION MAKING: Evolving/moderate complexity increasing pain in her quads   EVALUATION COMPLEXITY: Moderate   GOALS: Goals reviewed with patient? Yes  SHORT TERM GOALS: Target date: 04/18/2023  Patient will transfer sit to stand smoothly and independently  Baseline: Goal status: depends on the day 9/12  2.  Patient will increase gross strength by 5lbs  Baseline:  Goal status: moving some 9/12  3.  Patient will improve BERG score by 10  Baseline:  Goal status: INITIAL LONG TERM GOALS: Target date: 05/16/2023   Patient will go improve balance to CGA for all balance exercises to improve safety  Baseline:  Goal status: INITIAL  2.  Patient will report a 50% reduction in falls  Baseline:  Goal status: INITIAL  3.  Patient will have a full balance program  Baseline:  Goal status: INITIAL   PLAN: PT FREQUENCY: 2x/week  PT DURATION: 8 weeks  PLANNED INTERVENTIONS: Therapeutic exercises, Therapeutic activity, Neuromuscular re-education, Balance training, Gait training, Patient/Family education, Self Care, Joint mobilization, Stair training, Aquatic Therapy, Cryotherapy, Moist heat, Taping, Manual therapy, and Re-evaluation  PLAN FOR NEXT SESSION: work on improving sit to stand, posterior, dynamic, and static balance. Work on general conditioning. Be  aware of leg pain on any given day.      Wyman Songster, PT 06/02/2023, 10:12 AM  [facility] [address] [place], [place], [address] Phone: [phone]   Fax:  [phone]

## 2023-06-06 ENCOUNTER — Encounter (HOSPITAL_BASED_OUTPATIENT_CLINIC_OR_DEPARTMENT_OTHER): Payer: Self-pay | Admitting: Physical Therapy

## 2023-06-06 ENCOUNTER — Ambulatory Visit (HOSPITAL_BASED_OUTPATIENT_CLINIC_OR_DEPARTMENT_OTHER): Payer: Medicare Other | Admitting: Physical Therapy

## 2023-06-06 DIAGNOSIS — R2689 Other abnormalities of gait and mobility: Secondary | ICD-10-CM

## 2023-06-06 DIAGNOSIS — G20A1 Parkinson's disease without dyskinesia, without mention of fluctuations: Secondary | ICD-10-CM | POA: Diagnosis not present

## 2023-06-06 NOTE — Therapy (Signed)
OUTPATIENT PHYSICAL THERAPY LOWER EXTREMITY TREATMENT   Patient Name: Morgan [name] MRN: [id number] DOB:Dec 15, 1950, 72 y.o., female Today's Date: 06/06/2023   PT End of Session - 06/06/23 1100     Visit Number 15    Number of Visits 24    Date for PT Re-Evaluation 06/13/23    Authorization Type Progress note done at visit 10 next at 20    PT Start Time 1100    PT Stop Time 1140    PT Time Calculation (min) 40 min    Activity Tolerance Patient tolerated treatment well    Behavior During Therapy WFL for tasks assessed/performed                 Past Medical History:  Diagnosis Date   Anxiety    Fibromyalgia    Headache    Hypercholesteremia    controlled on medication   Hyperlipidemia    Hypertension    no current meds   LBBB (left bundle branch block)    Melanoma (HCC)    MVP (mitral valve prolapse)    Osteopenia    Parkinson disease (HCC)    Pneumonia    SVD (spontaneous vaginal delivery)    x 2   Past Surgical History:  Procedure Laterality Date   BLEPHAROPLASTY     cold knife conization     COLONOSCOPY     FH colon CA   CYSTOSCOPY W/ URETERAL STENT PLACEMENT Left 10/18/2021   Procedure: CYSTOSCOPY WITH RETROGRADE PYELOGRAM, URETERAL STENT PLACEMENT;  Surgeon: Jerilee Field, MD;  Location: [facility];  Service: Urology;  Laterality: Left;   DILATATION & CURETTAGE/HYSTEROSCOPY WITH MYOSURE N/A 02/14/2018   Procedure: ATTEMPTED DILATATION & CURETTAGE/HYSTEROSCOPY WITH MYOSURE;  Surgeon: Gerald Leitz, MD;  Location: [facility];  Service: Gynecology;  Laterality: N/A;  polyp   melanoma cancer     removed from left leg   RECONSTRUCTION OF EYELID     TONSILLECTOMY     TUBAL LIGATION     URETEROSCOPY WITH HOLMIUM LASER LITHOTRIPSY Left 12/17/2021   Procedure: URETEROSCOPY/HOLMIUM LASER/STENT EXCHANGE;  Surgeon: Jerilee Field, MD;  Location: [facility];  Service: Urology;  Laterality: Left;   WISDOM TOOTH EXTRACTION     Patient Active Problem List   Diagnosis  Date Noted   COVID-19 virus infection 05/16/2022   HTN (hypertension) 10/20/2021   Physical deconditioning 10/20/2021   Hydronephrosis with infection 10/18/2021   Bacteremia due to Escherichia coli 10/18/2021   Severe sepsis (HCC) 10/17/2021   UTI (urinary tract infection) 10/17/2021   Acute metabolic encephalopathy 10/17/2021   Parkinson disease (HCC) 10/17/2021   Cervical stenosis (uterine cervix) 02/14/2018   Postmenopausal bleeding 02/14/2018   LBBB (left bundle branch block) 12/25/2015   PVCs (premature ventricular contractions) 12/25/2015   Hyperlipidemia 12/25/2015   Fibromyalgia 01/16/2014   Cholelithiasis 04/25/2013   Paralysis agitans (HCC) 01/15/2013   Hereditary and idiopathic peripheral neuropathy 01/15/2013     [name] MD PCP:   Cindi Carbon PROVIDER: Antoine Primas MD   REFERRING DIAG: Parkinsons   THERAPY DIAG:  Other abnormalities of gait and mobility  Parkinson's disease without dyskinesia or fluctuating manifestations (HCC)  Rationale for Evaluation and Treatment Rehabilitation  ONSET DATE: Several years   SUBJECTIVE:   SUBJECTIVE STATEMENT: Patient states feeling a little off in her head today, husband says he feels she is a little off today too. Been drinking water today.   PERTINENT HISTORY: Fibromyalgia; headaches, LBBB; osteopenia   PAIN:  Are you having pain? no: NPRS scale:  4/10 Location: Legs Pain description:    Aggravating factors: just comes and goes  Relieving factors: just comes and goes; sometimes ibuprofen works but she is limited as to how much she can take  PRECAUTIONS: Patient reports that because of her heart she will pass out at times.   WEIGHT BEARING RESTRICTIONS No  FALLS:  Has patient fallen in last 6 months? Yes.  6 falls but likley more. Last fall    LIVING ENVIRONMENT: Stairs to the second floor in her house but she dosen't do much  4 steps in the front  2 steps in the garage  OCCUPATION: retired    Presenter, broadcasting: reading; goes to an exercise class   PLOF: Independent with household mobility with device  PATIENT GOALS  To have less pain in her legs and to move better    OBJECTIVE:  (measurements taken at evaluation unless otherwise noted)   DIAGNOSTIC FINDINGS:   PATIENT SURVEYS:  FOTO    COGNITION:  Overall cognitive status: Within functional limits for tasks assessed     SENSATION: Denies paresthesias   POSTURE: No Significant postural limitations  PALPATION: No trigger points noted   LOWER EXTREMITY ROM:  Passive ROM Right eval Left eval  Hip flexion    Hip extension    Hip abduction    Hip adduction    Hip internal rotation    Hip external rotation    Knee flexion    Knee extension    Ankle dorsiflexion    Ankle plantarflexion    Ankle inversion    Ankle eversion     (Blank rows = not tested)  WNL  LOWER EXTREMITY MMT:  MMT Right eval Left eval Right  7/22 Left  7/2 Right  9/12 Left 9/12  Hip flexion 21.7 29.7 23.5 25.9 24.6 25.6  Hip extension        Hip abduction 27.6 18.5 19.1 8.7 21.2 14.4  Hip adduction        Hip internal rotation        Hip external rotation        Knee flexion        Knee extension 23.5 20.2 17.7 16.8 22.7 16.3  Ankle dorsiflexion        Ankle plantarflexion        Ankle inversion        Ankle eversion         (Blank rows = not tested)  GAIT: Decreased hip flexion; leans to the left; right hip frop. Patient required CGA while walking and min a when she turned for balance. No change from IE   Functional tests:  Sit to stand: cuing to catch balance. Uses momentum to stand.    5x sit to stand Balance: 13 sec   9/12 5x sit to stand 13 sec    Narrow base min a  Narrow base eyes closed min to mod a  Tandem stance Min a both ways.  Single leg stance 5 sec bilateral       BERG Balance Test          Date:   Sit to Stand 1 2  Standing unsupported 3 3  Sitting with back unsupported but feet  supported 1 3  Stand to sit  1 2  Transfers  2 1  Standing unsupported with eyes closed 3 3  Standing unsupported feet together 3 3  From standing position, reach forward with outstretched arm 1 2  From standing position, pick up object  from floor 1 2  From standing position, turn and look behind over each shoulder 1 2  Turn 360 2 1  Standing unsupported, alternately place foot on step 1 1  Standing unsupported, one foot in front 1 1  Standing on one leg 0 0  Total:     21/30                                                                                                         26/56    TODAY'S TREATMENT: 06/06/23 BP 128/80 Nustep 5 minutes for dynamic warm up and conditioning level 5 Bilateral shoulder ER RTB 2 x 10  Seated shoulder horizontal abduction RTB 2 x 10 LAQ 2# 1 x 10 with 5 second holds Standing alternating march 2 x 10 bilateral Standing hip abduction 2 x 10 bilateral Squat with UE support 2 x 10 NBOS on foam 2 x 30 second holds   06/02/23 Nustep 5 minutes for dynamic warm up and conditioning level 5 Step taps 4 inch 2 x 10 STS with overhead reach 3 x 10 Row GTB 2 x 15 Shoulder extension GTB 2 x 15 Forward step with overhead reach 2 x 10 bilateral Bilateral shoulder press in seated 2# 3 x 10 Bicep curl 2# 2 x 15  10/3 Seated: Bilateral press 2 x 15 Bilateral punch 3 x 15 Bilateral curl 2 x 15 All performed with 2 pound weight with contact-guard assist for balance and sitting.  Patient sitting back unsupported.  LAQ red 2 x 15 Hip abduction red 2 x 15 Seated march 2 x 15  Airex: Narrow base of support 2 x 20 seconds hold Tandem stance 2 x 20-second hold  Heel raises 2 rock back for balance x 20 Standing slow march for balance 2 x 10 each leg   05/24/23 Nustep 5 minutes for dynamic warm up and conditioning level 5 Row GTB 2 x 10 Shoulder extension GTB 2 x 10 STS with overhead reach 2 x 10 4 inch step taps with cueing for timing 1 x 12  bilateral Shoulder press with overhead reach 1 and 2 lb weights 2 x 10   9/16 Seated: Bilateral press 2 x 15 Bilateral punch 3 x 15 Bilateral curl 2 x 15 All performed with 1-2 pound weight with contact-guard assist for balance and sitting.  Patient sitting back unsupported.  Sit to stand 5x 3 sets   Airex narrow base 3 x 30 seconds Airex narrow base eyes closed 2 x 20 seconds mod assist for balance  Single hurdle step over attempted 2 x 10.  Patient had significant difficulty today.  Mod assist for balance. Standing march with upper extremity support 2 x 15 moderate cueing for hip flexion Heel toe rocking 2 x 15.  Mod cueing to go forward and back.  Mod assist for balance.    9/12  BERG testing 3 min walk test  5x sit to stand  MMT   4 inch toe touching 2x15 4 inch step up 2x10 each leg  4 inch lateral step  ups 2x10 each leg     9/10 Gait: long strides 50'x2  Marching 50'x2  Side steps 50'x4 with seated rest breaks  Increased assistance required today for balance mod to max assist at times.  Gait  Single hurdle step over 2 x 15  Seated: Bilateral press 2 x 15 Bilateral punch 3 x 15 Bilateral curl 2 x 15 All performed with 1 pound weight with contact-guard assist for balance and sitting.  Patient sitting back unsupported.  Airex: Rock/back x 20 min assist for balance Narrow base 3 x 20 seconds eyes open 3 x 20 seconds eyes closed  9/5 Gait: long strides 50'x2  Marching 50'x2  Walking with head turns 30'x2  Walking looking up and down 30'x2  Hurdles 3 laps 6 4 hurdles side-to-side  Standing: march 3x15  Heel/toe 3x15  Squat 3x15    Coordination   Fwd fwd back back with rhythm x20   Sit to stand 5x    9/3  Laq 3x10 bilateral red  March 3x15 bilateral red  Hip abdcution 3x15 bilateral red   Mod cuing for range   Shoulder press 1 lb 2x15  Shoulder punch 2x10 bilateral biceps curl 3x15 1 lb   Stnading weight shift x20    8/29 Gait:  long strides 50'x2  Marching 50'x2  Walking with head turns 30'x2  Walking looking up and down 30'x2  Hurdles 3 laps 6 4 hurdles side-to-side   Sit to stand transfers 3x4 after the first set we raised the table. She did better with posterior balance and controlled decent. Cuing for controlled decent.   8/26 NuStep 5 minutes  Air-ex rock: mod a 1 min  Air-ex normal base 3x30 sec CGA -> mod A Marching on Airex 2 x 10 some difficulty lifting her legs  Hurdles 3 laps 6 4 hurdles side-to-side  Step up 2x10 4 inch  Lateral step up 2x10 4 inch   Sit to stand transfers 3x4 after the first set we raised the table. She did better with posterior balance and controlled decent. Cuing for controlled decent.   8/22 NuStep 5 minutes  Air-ex rock: mod a 1 min  Air-ex normal base 3x30 sec CGA -> mod A Marching on Airex 2 x 10 some difficulty lifting her legs  Hurdles 3 laps 6 4 hurdles side-to-side Step over hurdle and back 2 x 10  Patient required min to mod assist for all sit to stand transfers today.  Bicep curls 2 x 10 1 pounds D2 shoulder flexion 2 x 10 1 pound Punches 2 x 10 1 pound  LAQ 2 x 10  8/12 Nu-step 5 min   Sit to stand 3x3  improved tehcnoque with each trial. Posteiror loss of balance noted SBA - Max a   Air-ex rock: mod a 1 min  Air-ex normal base 3x30 sec CGA -> mod A   Coordination:   2 steps fwd 2 steps back 2x10 3 SF 3 SB 2x10   2 side steps 2x10   Seated balance:   Double arm press 1lb 2x10  Single arm press alt 2x10  Leg kicks with cuing to keep it slow 2x10   Hurdle tep over x10       PATIENT EDUCATION:  Education details: exercise rationale and technique   Person educated: Patient Education method: Explanation, Demonstration, Tactile cues, Verbal cues, Education comprehension: verbalized understanding, returned demonstration, verbal cues required, tactile cues required, and needs further education   HOME EXERCISE PROGRAM:  has a  program already   ASSESSMENT:  CLINICAL IMPRESSION: Patient feeling "off " today and has been trying to stay hydrated. Checked BP at 128/80 and gave water. Patient with slower mobility than usual today and increased unsteadiness with standing. Began session on nustep for dynamic warm up and conditioning. Performed additional UE exercises in seated today as patient with increased difficulty today with standing mobility. UE support and CGA for standing exercises. Cueing given for slow controlled exercises. Patient will continue to benefit from physical therapy in order to improve function and reduce impairment.    OBJECTIVE IMPAIRMENTS Abnormal gait, decreased activity tolerance, decreased balance, decreased mobility, difficulty walking, decreased strength, impaired tone, and pain.   ACTIVITY LIMITATIONS carrying, lifting, bending, standing, squatting, stairs, transfers, bed mobility, dressing, and locomotion level  PARTICIPATION LIMITATIONS: meal prep, cleaning, laundry, driving, shopping, community activity, and yard work  PERSONAL FACTORS Fibromyalgia; headaches, LBBB; osteopenia  are also affecting patient's functional outcome.   REHAB POTENTIAL: Good  CLINICAL DECISION MAKING: Evolving/moderate complexity increasing pain in her quads   EVALUATION COMPLEXITY: Moderate   GOALS: Goals reviewed with patient? Yes  SHORT TERM GOALS: Target date: 04/18/2023  Patient will transfer sit to stand smoothly and independently  Baseline: Goal status: depends on the day 9/12  2.  Patient will increase gross strength by 5lbs  Baseline:  Goal status: moving some 9/12  3.  Patient will improve BERG score by 10  Baseline:  Goal status: INITIAL LONG TERM GOALS: Target date: 05/16/2023   Patient will go improve balance to CGA for all balance exercises to improve safety  Baseline:  Goal status: INITIAL  2.  Patient will report a 50% reduction in falls  Baseline:  Goal status: INITIAL  3.   Patient will have a full balance program  Baseline:  Goal status: INITIAL   PLAN: PT FREQUENCY: 2x/week  PT DURATION: 8 weeks  PLANNED INTERVENTIONS: Therapeutic exercises, Therapeutic activity, Neuromuscular re-education, Balance training, Gait training, Patient/Family education, Self Care, Joint mobilization, Stair training, Aquatic Therapy, Cryotherapy, Moist heat, Taping, Manual therapy, and Re-evaluation  PLAN FOR NEXT SESSION: work on improving sit to stand, posterior, dynamic, and static balance. Work on general conditioning. Be aware of leg pain on any given day.      Reola Mosher Keatyn Luck, PT 06/06/2023, 11:01 AM  [facility] [address] [place], [place], [address] Phone: [phone]   Fax:  [phone]

## 2023-06-08 ENCOUNTER — Ambulatory Visit: Payer: Medicare Other | Admitting: Family Medicine

## 2023-06-08 ENCOUNTER — Encounter: Payer: Self-pay | Admitting: Family Medicine

## 2023-06-08 VITALS — BP 122/82 | HR 79 | Ht 64.0 in

## 2023-06-08 DIAGNOSIS — M797 Fibromyalgia: Secondary | ICD-10-CM

## 2023-06-08 DIAGNOSIS — G20B2 Parkinson's disease with dyskinesia, with fluctuations: Secondary | ICD-10-CM | POA: Diagnosis not present

## 2023-06-08 NOTE — Patient Instructions (Signed)
Good to see you  You are doing great over all  Be proud of all that you have accomplished PT referral placed  See me again in 3 months

## 2023-06-08 NOTE — Assessment & Plan Note (Signed)
Patient has made significant strides from the very beginning of treatment.  She has lost weight, better muscle control.  Discussed icing regimen and home exercises.  Discussed which activities to do very well with the physical therapy.  They would like to continue to do this at least once a week.  I would like to restart this.  Discussed avoiding certain activities.  Discussed home exercises and icing regimen.  Follow-up again in 6 to 8 weeks.

## 2023-06-13 ENCOUNTER — Ambulatory Visit (HOSPITAL_BASED_OUTPATIENT_CLINIC_OR_DEPARTMENT_OTHER): Payer: Medicare Other | Admitting: Physical Therapy

## 2023-06-13 ENCOUNTER — Encounter (HOSPITAL_BASED_OUTPATIENT_CLINIC_OR_DEPARTMENT_OTHER): Payer: Self-pay | Admitting: Physical Therapy

## 2023-06-13 DIAGNOSIS — R2689 Other abnormalities of gait and mobility: Secondary | ICD-10-CM | POA: Diagnosis not present

## 2023-06-13 DIAGNOSIS — G20A1 Parkinson's disease without dyskinesia, without mention of fluctuations: Secondary | ICD-10-CM | POA: Diagnosis not present

## 2023-06-13 NOTE — Therapy (Signed)
OUTPATIENT PHYSICAL THERAPY LOWER EXTREMITY TREATMENT   Patient Name: Morgan [name] MRN: [id number] DOB:April 15, 1951, 72 y.o., female Today's Date: 06/13/2023  Progress Note   Reporting Period 06/10/23 to 06/13/23   See note below for Objective Data and Assessment of Progress/Goals    PT End of Session - 06/13/23 1057     Visit Number 16    Number of Visits 40    Date for PT Re-Evaluation 08/08/23    Authorization Type Progress note done at visit 16 next at 26    PT Start Time 1100    PT Stop Time 1140    PT Time Calculation (min) 40 min    Activity Tolerance Patient tolerated treatment well    Behavior During Therapy WFL for tasks assessed/performed                 Past Medical History:  Diagnosis Date   Anxiety    Fibromyalgia    Headache    Hypercholesteremia    controlled on medication   Hyperlipidemia    Hypertension    no current meds   LBBB (left bundle branch block)    Melanoma (HCC)    MVP (mitral valve prolapse)    Osteopenia    Parkinson disease (HCC)    Pneumonia    SVD (spontaneous vaginal delivery)    x 2   Past Surgical History:  Procedure Laterality Date   BLEPHAROPLASTY     cold knife conization     COLONOSCOPY     FH colon CA   CYSTOSCOPY W/ URETERAL STENT PLACEMENT Left 10/18/2021   Procedure: CYSTOSCOPY WITH RETROGRADE PYELOGRAM, URETERAL STENT PLACEMENT;  Surgeon: Jerilee Field, MD;  Location: [facility];  Service: Urology;  Laterality: Left;   DILATATION & CURETTAGE/HYSTEROSCOPY WITH MYOSURE N/A 02/14/2018   Procedure: ATTEMPTED DILATATION & CURETTAGE/HYSTEROSCOPY WITH MYOSURE;  Surgeon: Gerald Leitz, MD;  Location: [facility];  Service: Gynecology;  Laterality: N/A;  polyp   melanoma cancer     removed from left leg   RECONSTRUCTION OF EYELID     TONSILLECTOMY     TUBAL LIGATION     URETEROSCOPY WITH HOLMIUM LASER LITHOTRIPSY Left 12/17/2021   Procedure: URETEROSCOPY/HOLMIUM LASER/STENT EXCHANGE;  Surgeon: Jerilee Field, MD;   Location: [facility];  Service: Urology;  Laterality: Left;   WISDOM TOOTH EXTRACTION     Patient Active Problem List   Diagnosis Date Noted   COVID-19 virus infection 05/16/2022   HTN (hypertension) 10/20/2021   Physical deconditioning 10/20/2021   Hydronephrosis with infection 10/18/2021   Bacteremia due to Escherichia coli 10/18/2021   Severe sepsis (HCC) 10/17/2021   UTI (urinary tract infection) 10/17/2021   Acute metabolic encephalopathy 10/17/2021   Parkinson disease (HCC) 10/17/2021   Cervical stenosis (uterine cervix) 02/14/2018   Postmenopausal bleeding 02/14/2018   LBBB (left bundle branch block) 12/25/2015   PVCs (premature ventricular contractions) 12/25/2015   Hyperlipidemia 12/25/2015   Fibromyalgia 01/16/2014   Cholelithiasis 04/25/2013   Paralysis agitans (HCC) 01/15/2013   Hereditary and idiopathic peripheral neuropathy 01/15/2013     [name] MD PCP:   Cindi Carbon PROVIDER: Antoine Primas MD   REFERRING DIAG: Parkinsons   THERAPY DIAG:  Other abnormalities of gait and mobility  Parkinson's disease without dyskinesia or fluctuating manifestations (HCC)  Rationale for Evaluation and Treatment Rehabilitation  ONSET DATE: Several years   SUBJECTIVE:   SUBJECTIVE STATEMENT: Patient states feeling good for the most part. Feels that things are improving. Legs are not doing all that much better.  Still has trouble with them in the morning. Balance seems to be getting better. Hasn't fallen in at least a few days. Feels that she is falling about the same. Hasn't been doing HEP as she can't find her papers.    PERTINENT HISTORY: Fibromyalgia; headaches, LBBB; osteopenia   PAIN:  Are you having pain? no: NPRS scale: 4/10 Location: Legs Pain description:    Aggravating factors: just comes and goes  Relieving factors: just comes and goes; sometimes ibuprofen works but she is limited as to how much she can take  PRECAUTIONS: Patient reports that because of  her heart she will pass out at times.   WEIGHT BEARING RESTRICTIONS No  FALLS:  Has patient fallen in last 6 months? Yes.  6 falls but likley more. Last fall    LIVING ENVIRONMENT: Stairs to the second floor in her house but she dosen't do much  4 steps in the front  2 steps in the garage  OCCUPATION: retired   Presenter, broadcasting: reading; goes to an exercise class   PLOF: Independent with household mobility with device  PATIENT GOALS  To have less pain in her legs and to move better    OBJECTIVE:  (measurements taken at evaluation unless otherwise noted)   DIAGNOSTIC FINDINGS:   PATIENT SURVEYS:  FOTO    COGNITION:  Overall cognitive status: Within functional limits for tasks assessed     SENSATION: Denies paresthesias   POSTURE: No Significant postural limitations  PALPATION: No trigger points noted   LOWER EXTREMITY ROM:  Passive ROM Right eval Left eval  Hip flexion    Hip extension    Hip abduction    Hip adduction    Hip internal rotation    Hip external rotation    Knee flexion    Knee extension    Ankle dorsiflexion    Ankle plantarflexion    Ankle inversion    Ankle eversion     (Blank rows = not tested)  WNL  LOWER EXTREMITY MMT:  MMT Right eval Left eval Right  7/22 Left  7/2 Right  9/12 Left 9/12 Right 06/13/23 Left 06/13/23  Hip flexion 21.7 29.7 23.5 25.9 24.6 25.6 41.3 37.8  Hip extension          Hip abduction 27.6 18.5 19.1 8.7 21.2 14.4 29.9 29.5  Hip adduction          Hip internal rotation          Hip external rotation          Knee flexion          Knee extension 23.5 20.2 17.7 16.8 22.7 16.3 39.4 49.8  Ankle dorsiflexion          Ankle plantarflexion          Ankle inversion          Ankle eversion           (Blank rows = not tested)  GAIT: Decreased hip flexion; leans to the left; right hip frop. Patient required CGA while walking and min a when she turned for balance. No change from IE   Functional tests:  Sit to  stand: cuing to catch balance. Uses momentum to stand.    5x sit to stand Balance: 13 sec   9/12 5x sit to stand 13 sec    Narrow base min a  Narrow base eyes closed min to mod a  Tandem stance Min a both ways.  Single leg stance 5  sec bilateral     06/13/23: 285 feet with walking stick, CGA  for safety  BERG Balance Test          Date:      Sit to Stand 1 2 06/13/23: 2  Standing unsupported 3 3 3   Sitting with back unsupported but feet supported 1 3 4   Stand to sit  1 2 3   Transfers  2 1 2   Standing unsupported with eyes closed 3 3 3   Standing unsupported feet together 3 3 3   From standing position, reach forward with outstretched arm 1 2 3   From standing position, pick up object from floor 1 2 3   From standing position, turn and look behind over each shoulder 1 2 2   Turn 360 2 1 2   Standing unsupported, alternately place foot on step 1 1 2   Standing unsupported, one foot in front 1 1 2   Standing on one leg 0 0 2  Total:  21/56  34/56  21/30                                                                                                         26/56    TODAY'S TREATMENT: 06/13/23 Reassessment   06/06/23 BP 128/80 Nustep 5 minutes for dynamic warm up and conditioning level 5 Bilateral shoulder ER RTB 2 x 10  Seated shoulder horizontal abduction RTB 2 x 10 LAQ 2# 1 x 10 with 5 second holds Standing alternating march 2 x 10 bilateral Standing hip abduction 2 x 10 bilateral Squat with UE support 2 x 10 NBOS on foam 2 x 30 second holds   06/02/23 Nustep 5 minutes for dynamic warm up and conditioning level 5 Step taps 4 inch 2 x 10 STS with overhead reach 3 x 10 Row GTB 2 x 15 Shoulder extension GTB 2 x 15 Forward step with overhead reach 2 x 10 bilateral Bilateral shoulder press in seated 2# 3 x 10 Bicep curl 2# 2 x 15  10/3 Seated: Bilateral press 2 x 15 Bilateral punch 3 x 15 Bilateral curl 2 x 15 All performed with 2 pound weight with  contact-guard assist for balance and sitting.  Patient sitting back unsupported.  LAQ red 2 x 15 Hip abduction red 2 x 15 Seated march 2 x 15  Airex: Narrow base of support 2 x 20 seconds hold Tandem stance 2 x 20-second hold  Heel raises 2 rock back for balance x 20 Standing slow march for balance 2 x 10 each leg   05/24/23 Nustep 5 minutes for dynamic warm up and conditioning level 5 Row GTB 2 x 10 Shoulder extension GTB 2 x 10 STS with overhead reach 2 x 10 4 inch step taps with cueing for timing 1 x 12 bilateral Shoulder press with overhead reach 1 and 2 lb weights 2 x 10   9/16 Seated: Bilateral press 2 x 15 Bilateral punch 3 x 15 Bilateral curl 2 x 15 All performed with 1-2 pound weight with contact-guard assist for balance and sitting.  Patient sitting back  unsupported.  Sit to stand 5x 3 sets   Airex narrow base 3 x 30 seconds Airex narrow base eyes closed 2 x 20 seconds mod assist for balance  Single hurdle step over attempted 2 x 10.  Patient had significant difficulty today.  Mod assist for balance. Standing march with upper extremity support 2 x 15 moderate cueing for hip flexion Heel toe rocking 2 x 15.  Mod cueing to go forward and back.  Mod assist for balance.    9/12  BERG testing 3 min walk test  5x sit to stand  MMT   4 inch toe touching 2x15 4 inch step up 2x10 each leg  4 inch lateral step ups 2x10 each leg     9/10 Gait: long strides 50'x2  Marching 50'x2  Side steps 50'x4 with seated rest breaks  Increased assistance required today for balance mod to max assist at times.  Gait  Single hurdle step over 2 x 15  Seated: Bilateral press 2 x 15 Bilateral punch 3 x 15 Bilateral curl 2 x 15 All performed with 1 pound weight with contact-guard assist for balance and sitting.  Patient sitting back unsupported.  Airex: Rock/back x 20 min assist for balance Narrow base 3 x 20 seconds eyes open 3 x 20 seconds eyes closed  9/5 Gait:  long strides 50'x2  Marching 50'x2  Walking with head turns 30'x2  Walking looking up and down 30'x2  Hurdles 3 laps 6 4 hurdles side-to-side  Standing: march 3x15  Heel/toe 3x15  Squat 3x15    Coordination   Fwd fwd back back with rhythm x20   Sit to stand 5x    9/3  Laq 3x10 bilateral red  March 3x15 bilateral red  Hip abdcution 3x15 bilateral red   Mod cuing for range   Shoulder press 1 lb 2x15  Shoulder punch 2x10 bilateral biceps curl 3x15 1 lb   Stnading weight shift x20    8/29 Gait: long strides 50'x2  Marching 50'x2  Walking with head turns 30'x2  Walking looking up and down 30'x2  Hurdles 3 laps 6 4 hurdles side-to-side   Sit to stand transfers 3x4 after the first set we raised the table. She did better with posterior balance and controlled decent. Cuing for controlled decent.   8/26 NuStep 5 minutes  Air-ex rock: mod a 1 min  Air-ex normal base 3x30 sec CGA -> mod A Marching on Airex 2 x 10 some difficulty lifting her legs  Hurdles 3 laps 6 4 hurdles side-to-side  Step up 2x10 4 inch  Lateral step up 2x10 4 inch   Sit to stand transfers 3x4 after the first set we raised the table. She did better with posterior balance and controlled decent. Cuing for controlled decent.   8/22 NuStep 5 minutes  Air-ex rock: mod a 1 min  Air-ex normal base 3x30 sec CGA -> mod A Marching on Airex 2 x 10 some difficulty lifting her legs  Hurdles 3 laps 6 4 hurdles side-to-side Step over hurdle and back 2 x 10  Patient required min to mod assist for all sit to stand transfers today.  Bicep curls 2 x 10 1 pounds D2 shoulder flexion 2 x 10 1 pound Punches 2 x 10 1 pound  LAQ 2 x 10  8/12 Nu-step 5 min   Sit to stand 3x3  improved tehcnoque with each trial. Posteiror loss of balance noted SBA - Max a   Air-ex rock: mod a  1 min  Air-ex normal base 3x30 sec CGA -> mod A   Coordination:   2 steps fwd 2 steps back 2x10 3 SF 3 SB 2x10   2 side  steps 2x10   Seated balance:   Double arm press 1lb 2x10  Single arm press alt 2x10  Leg kicks with cuing to keep it slow 2x10   Hurdle tep over x10       PATIENT EDUCATION:  Education details: exercise rationale and technique   Person educated: Patient Education method: Explanation, Demonstration, Tactile cues, Verbal cues, Education comprehension: verbalized understanding, returned demonstration, verbal cues required, tactile cues required, and needs further education   HOME EXERCISE PROGRAM:  has a program already   ASSESSMENT:  CLINICAL IMPRESSION: Patient overall performing better today and appears to be having a good day. Patient has met 2/3 short term goals and 0/3 long term goals with ability to complete HEP and improvement in strength and balance. Remaining goals not met due to continued deficits in symptoms, strength, ROM, activity tolerance, gait, balance, and functional mobility. Patient has made good progress toward remaining goals. Extending POC 1-2x/week for 8 weeks. Patient continues to require skilled care for safety, balance and to reduce risk of fall/injury but is progressing well. Patient will continue to benefit from skilled physical therapy in order to improve function and reduce impairment.     OBJECTIVE IMPAIRMENTS Abnormal gait, decreased activity tolerance, decreased balance, decreased mobility, difficulty walking, decreased strength, impaired tone, and pain.   ACTIVITY LIMITATIONS carrying, lifting, bending, standing, squatting, stairs, transfers, bed mobility, dressing, and locomotion level  PARTICIPATION LIMITATIONS: meal prep, cleaning, laundry, driving, shopping, community activity, and yard work  PERSONAL FACTORS Fibromyalgia; headaches, LBBB; osteopenia  are also affecting patient's functional outcome.   REHAB POTENTIAL: Good  CLINICAL DECISION MAKING: Evolving/moderate complexity increasing pain in her quads   EVALUATION COMPLEXITY:  Moderate   GOALS: Goals reviewed with patient? Yes  SHORT TERM GOALS: Target date: 04/18/2023  Patient will transfer sit to stand smoothly and independently  Baseline: Goal status: depends on the day 9/12  2.  Patient will increase gross strength by 5lbs  Baseline:  Goal status: MET  3.  Patient will improve BERG score by 10  Baseline:  Goal status: MET LONG TERM GOALS: Target date: 05/16/2023   Patient will go improve balance to CGA for all balance exercises to improve safety  Baseline:  Goal status: INITIAL  2.  Patient will report a 50% reduction in falls  Baseline:  Goal status: INITIAL  3.  Patient will have a full balance program  Baseline:  Goal status: INITIAL   PLAN: PT FREQUENCY: 2x/week  PT DURATION: 8 weeks  PLANNED INTERVENTIONS: Therapeutic exercises, Therapeutic activity, Neuromuscular re-education, Balance training, Gait training, Patient/Family education, Self Care, Joint mobilization, Stair training, Aquatic Therapy, Cryotherapy, Moist heat, Taping, Manual therapy, and Re-evaluation  PLAN FOR NEXT SESSION: work on improving sit to stand, posterior, dynamic, and static balance. Work on general conditioning. Be aware of leg pain on any given day. Provide an HEP next session as patient misplaced old one    Wyman Songster, PT 06/13/2023, 11:45 AM  [facility] [address] [place], [place], [address] Phone: [phone]   Fax:  [phone]

## 2023-06-14 ENCOUNTER — Encounter (HOSPITAL_BASED_OUTPATIENT_CLINIC_OR_DEPARTMENT_OTHER): Payer: Medicare Other | Admitting: Physical Therapy

## 2023-06-16 ENCOUNTER — Encounter (HOSPITAL_BASED_OUTPATIENT_CLINIC_OR_DEPARTMENT_OTHER): Payer: Self-pay | Admitting: Physical Therapy

## 2023-06-16 ENCOUNTER — Ambulatory Visit (HOSPITAL_BASED_OUTPATIENT_CLINIC_OR_DEPARTMENT_OTHER): Payer: Medicare Other | Admitting: Physical Therapy

## 2023-06-16 DIAGNOSIS — G20A1 Parkinson's disease without dyskinesia, without mention of fluctuations: Secondary | ICD-10-CM

## 2023-06-16 DIAGNOSIS — R2689 Other abnormalities of gait and mobility: Secondary | ICD-10-CM | POA: Diagnosis not present

## 2023-06-16 NOTE — Therapy (Signed)
OUTPATIENT PHYSICAL THERAPY LOWER EXTREMITY TREATMENT   Patient Name: Morgan [name] MRN: [id number] DOB:[birth year], 72 y.o., female Today's Date: 06/18/2023    PT End of Session - 06/18/23 1941     Visit Number 17    Number of Visits 40    Date for PT Re-Evaluation 08/08/23    Authorization Type Progress note done at visit 16 next at 26    PT Start Time 1104    PT Stop Time 1145    PT Time Calculation (min) 41 min    Activity Tolerance Patient tolerated treatment well    Behavior During Therapy WFL for tasks assessed/performed                  Past Medical History:  Diagnosis Date   Anxiety    Fibromyalgia    Headache    Hypercholesteremia    controlled on medication   Hyperlipidemia    Hypertension    no current meds   LBBB (left bundle branch block)    Melanoma (HCC)    MVP (mitral valve prolapse)    Osteopenia    Parkinson disease (HCC)    Pneumonia    SVD (spontaneous vaginal delivery)    x 2   Past Surgical History:  Procedure Laterality Date   BLEPHAROPLASTY     cold knife conization     COLONOSCOPY     FH colon CA   CYSTOSCOPY W/ URETERAL STENT PLACEMENT Left 10/18/2021   Procedure: CYSTOSCOPY WITH RETROGRADE PYELOGRAM, URETERAL STENT PLACEMENT;  Surgeon: Jerilee Field, MD;  Location: [facility];  Service: Urology;  Laterality: Left;   DILATATION & CURETTAGE/HYSTEROSCOPY WITH MYOSURE N/A 02/14/2018   Procedure: ATTEMPTED DILATATION & CURETTAGE/HYSTEROSCOPY WITH MYOSURE;  Surgeon: Gerald Leitz, MD;  Location: [facility];  Service: Gynecology;  Laterality: N/A;  polyp   melanoma cancer     removed from left leg   RECONSTRUCTION OF EYELID     TONSILLECTOMY     TUBAL LIGATION     URETEROSCOPY WITH HOLMIUM LASER LITHOTRIPSY Left 12/17/2021   Procedure: URETEROSCOPY/HOLMIUM LASER/STENT EXCHANGE;  Surgeon: Jerilee Field, MD;  Location: [facility];  Service: Urology;  Laterality: Left;   WISDOM TOOTH EXTRACTION     Patient Active Problem List    Diagnosis Date Noted   COVID-19 virus infection 05/16/2022   HTN (hypertension) 10/20/2021   Physical deconditioning 10/20/2021   Hydronephrosis with infection 10/18/2021   Bacteremia due to Escherichia coli 10/18/2021   Severe sepsis (HCC) 10/17/2021   UTI (urinary tract infection) 10/17/2021   Acute metabolic encephalopathy 10/17/2021   Parkinson disease (HCC) 10/17/2021   Cervical stenosis (uterine cervix) 02/14/2018   Postmenopausal bleeding 02/14/2018   LBBB (left bundle branch block) 12/25/2015   PVCs (premature ventricular contractions) 12/25/2015   Hyperlipidemia 12/25/2015   Fibromyalgia 01/16/2014   Cholelithiasis 04/25/2013   Paralysis agitans (HCC) 01/15/2013   Hereditary and idiopathic peripheral neuropathy 01/15/2013     [name] MD PCP:   Cindi Carbon PROVIDER: Antoine Primas MD   REFERRING DIAG: Parkinsons   THERAPY DIAG:  Other abnormalities of gait and mobility  Parkinson's disease without dyskinesia or fluctuating manifestations (HCC)  Rationale for Evaluation and Treatment Rehabilitation  ONSET DATE: Several years   SUBJECTIVE:   SUBJECTIVE STATEMENT: Patient is having some baseline balance issues today.  PERTINENT HISTORY: Fibromyalgia; headaches, LBBB; osteopenia   PAIN:  Are you having pain? no: NPRS scale: 4/10 Location: Legs Pain description:    Aggravating factors: just comes and goes  Relieving  factors: just comes and goes; sometimes ibuprofen works but she is limited as to how much she can take  PRECAUTIONS: Patient reports that because of her heart she will pass out at times.   WEIGHT BEARING RESTRICTIONS No  FALLS:  Has patient fallen in last 6 months? Yes.  6 falls but likley more. Last fall    LIVING ENVIRONMENT: Stairs to the second floor in her house but she dosen't do much  4 steps in the front  2 steps in the garage  OCCUPATION: retired   Presenter, broadcasting: reading; goes to an exercise class   PLOF: Independent with  household mobility with device  PATIENT GOALS  To have less pain in her legs and to move better    OBJECTIVE:  (measurements taken at evaluation unless otherwise noted)   DIAGNOSTIC FINDINGS:   PATIENT SURVEYS:  FOTO    COGNITION:  Overall cognitive status: Within functional limits for tasks assessed     SENSATION: Denies paresthesias   POSTURE: No Significant postural limitations  PALPATION: No trigger points noted   LOWER EXTREMITY ROM:  Passive ROM Right eval Left eval  Hip flexion    Hip extension    Hip abduction    Hip adduction    Hip internal rotation    Hip external rotation    Knee flexion    Knee extension    Ankle dorsiflexion    Ankle plantarflexion    Ankle inversion    Ankle eversion     (Blank rows = not tested)  WNL  LOWER EXTREMITY MMT:  MMT Right eval Left eval Right  7/22 Left  7/2 Right  9/12 Left 9/12 Right 06/13/23 Left 06/13/23  Hip flexion 21.7 29.7 23.5 25.9 24.6 25.6 41.3 37.8  Hip extension          Hip abduction 27.6 18.5 19.1 8.7 21.2 14.4 29.9 29.5  Hip adduction          Hip internal rotation          Hip external rotation          Knee flexion          Knee extension 23.5 20.2 17.7 16.8 22.7 16.3 39.4 49.8  Ankle dorsiflexion          Ankle plantarflexion          Ankle inversion          Ankle eversion           (Blank rows = not tested)  GAIT: Decreased hip flexion; leans to the left; right hip frop. Patient required CGA while walking and min a when she turned for balance. No change from IE   Functional tests:  Sit to stand: cuing to catch balance. Uses momentum to stand.    5x sit to stand Balance: 13 sec   9/12 5x sit to stand 13 sec    Narrow base min a  Narrow base eyes closed min to mod a  Tandem stance Min a both ways.  Single leg stance 5 sec bilateral     06/13/23: 285 feet with walking stick, CGA  for safety  BERG Balance Test          Date:      Sit to Stand 1 2 06/13/23: 2   Standing unsupported 3 3 3   Sitting with back unsupported but feet supported 1 3 4   Stand to sit  1 2 3   Transfers  2 1 2   Standing unsupported  with eyes closed 3 3 3   Standing unsupported feet together 3 3 3   From standing position, reach forward with outstretched arm 1 2 3   From standing position, pick up object from floor 1 2 3   From standing position, turn and look behind over each shoulder 1 2 2   Turn 360 2 1 2   Standing unsupported, alternately place foot on step 1 1 2   Standing unsupported, one foot in front 1 1 2   Standing on one leg 0 0 2  Total:  21/56  34/56  21/30                                                                                                         26/56    TODAY'S TREATMENT: 10/18 Airex: Narrow base of support eyes open 3 x 20-second hold Airex tandem stance 2 x 20-second hold  Heel/toe rock 2 x 15 Standing march 2 x 15  Lateral walk 5 feet with upper extremity assist and mod assist for balance  Hurdle step over 2 x 10  Bilateral press 2 x 15 Bilateral punch 3 x 15 Bilateral curl 2 x 15 All performed with 2 pound weight     06/13/23 Reassessment   06/06/23 BP 128/80 Nustep 5 minutes for dynamic warm up and conditioning level 5 Bilateral shoulder ER RTB 2 x 10  Seated shoulder horizontal abduction RTB 2 x 10 LAQ 2# 1 x 10 with 5 second holds Standing alternating march 2 x 10 bilateral Standing hip abduction 2 x 10 bilateral Squat with UE support 2 x 10 NBOS on foam 2 x 30 second holds   06/02/23 Nustep 5 minutes for dynamic warm up and conditioning level 5 Step taps 4 inch 2 x 10 STS with overhead reach 3 x 10 Row GTB 2 x 15 Shoulder extension GTB 2 x 15 Forward step with overhead reach 2 x 10 bilateral Bilateral shoulder press in seated 2# 3 x 10 Bicep curl 2# 2 x 15  10/3 Seated: Bilateral press 2 x 15 Bilateral punch 3 x 15 Bilateral curl 2 x 15 All performed with 2 pound weight with contact-guard assist for  balance and sitting.  Patient sitting back unsupported.  LAQ red 2 x 15 Hip abduction red 2 x 15 Seated march 2 x 15  Airex: Narrow base of support 2 x 20 seconds hold Tandem stance 2 x 20-second hold  Heel raises 2 rock back for balance x 20 Standing slow march for balance 2 x 10 each leg   05/24/23 Nustep 5 minutes for dynamic warm up and conditioning level 5 Row GTB 2 x 10 Shoulder extension GTB 2 x 10 STS with overhead reach 2 x 10 4 inch step taps with cueing for timing 1 x 12 bilateral Shoulder press with overhead reach 1 and 2 lb weights 2 x 10   9/16 Seated: Bilateral press 2 x 15 Bilateral punch 3 x 15 Bilateral curl 2 x 15 All performed with 1-2 pound weight with contact-guard assist for balance and sitting.  Patient sitting  back unsupported.  Sit to stand 5x 3 sets   Airex narrow base 3 x 30 seconds Airex narrow base eyes closed 2 x 20 seconds mod assist for balance  Single hurdle step over attempted 2 x 10.  Patient had significant difficulty today.  Mod assist for balance. Standing march with upper extremity support 2 x 15 moderate cueing for hip flexion Heel toe rocking 2 x 15.  Mod cueing to go forward and back.  Mod assist for balance.    9/12  BERG testing 3 min walk test  5x sit to stand  MMT   4 inch toe touching 2x15 4 inch step up 2x10 each leg  4 inch lateral step ups 2x10 each leg     9/10 Gait: long strides 50'x2  Marching 50'x2  Side steps 50'x4 with seated rest breaks  Increased assistance required today for balance mod to max assist at times.  Gait  Single hurdle step over 2 x 15  Seated: Bilateral press 2 x 15 Bilateral punch 3 x 15 Bilateral curl 2 x 15 All performed with 1 pound weight with contact-guard assist for balance and sitting.  Patient sitting back unsupported.  Airex: Rock/back x 20 min assist for balance Narrow base 3 x 20 seconds eyes open 3 x 20 seconds eyes closed  9/5 Gait: long strides 50'x2   Marching 50'x2  Walking with head turns 30'x2  Walking looking up and down 30'x2  Hurdles 3 laps 6 4 hurdles side-to-side  Standing: march 3x15  Heel/toe 3x15  Squat 3x15    Coordination   Fwd fwd back back with rhythm x20   Sit to stand 5x    9/3  Laq 3x10 bilateral red  March 3x15 bilateral red  Hip abdcution 3x15 bilateral red   Mod cuing for range   Shoulder press 1 lb 2x15  Shoulder punch 2x10 bilateral biceps curl 3x15 1 lb   Stnading weight shift x20    8/29 Gait: long strides 50'x2  Marching 50'x2  Walking with head turns 30'x2  Walking looking up and down 30'x2  Hurdles 3 laps 6 4 hurdles side-to-side   Sit to stand transfers 3x4 after the first set we raised the table. She did better with posterior balance and controlled decent. Cuing for controlled decent.   8/26 NuStep 5 minutes  Air-ex rock: mod a 1 min  Air-ex normal base 3x30 sec CGA -> mod A Marching on Airex 2 x 10 some difficulty lifting her legs  Hurdles 3 laps 6 4 hurdles side-to-side  Step up 2x10 4 inch  Lateral step up 2x10 4 inch   Sit to stand transfers 3x4 after the first set we raised the table. She did better with posterior balance and controlled decent. Cuing for controlled decent.   8/22 NuStep 5 minutes  Air-ex rock: mod a 1 min  Air-ex normal base 3x30 sec CGA -> mod A Marching on Airex 2 x 10 some difficulty lifting her legs  Hurdles 3 laps 6 4 hurdles side-to-side Step over hurdle and back 2 x 10  Patient required min to mod assist for all sit to stand transfers today.  Bicep curls 2 x 10 1 pounds D2 shoulder flexion 2 x 10 1 pound Punches 2 x 10 1 pound  LAQ 2 x 10  8/12 Nu-step 5 min   Sit to stand 3x3  improved tehcnoque with each trial. Posteiror loss of balance noted SBA - Max a   Air-ex rock: mod  a 1 min  Air-ex normal base 3x30 sec CGA -> mod A   Coordination:   2 steps fwd 2 steps back 2x10 3 SF 3 SB 2x10   2 side steps 2x10   Seated  balance:   Double arm press 1lb 2x10  Single arm press alt 2x10  Leg kicks with cuing to keep it slow 2x10   Hurdle tep over x10       PATIENT EDUCATION:  Education details: exercise rationale and technique   Person educated: Patient Education method: Explanation, Demonstration, Tactile cues, Verbal cues, Education comprehension: verbalized understanding, returned demonstration, verbal cues required, tactile cues required, and needs further education   HOME EXERCISE PROGRAM:  has a program already   ASSESSMENT:  CLINICAL IMPRESSION: The patient had more balance issues today.  She required cueing to pick her feet up.  She required assist for mobility exercises.  She tolerated upper extremity exercises well.  We will continue to progress as tolerated.  OBJECTIVE IMPAIRMENTS Abnormal gait, decreased activity tolerance, decreased balance, decreased mobility, difficulty walking, decreased strength, impaired tone, and pain.   ACTIVITY LIMITATIONS carrying, lifting, bending, standing, squatting, stairs, transfers, bed mobility, dressing, and locomotion level  PARTICIPATION LIMITATIONS: meal prep, cleaning, laundry, driving, shopping, community activity, and yard work  PERSONAL FACTORS Fibromyalgia; headaches, LBBB; osteopenia  are also affecting patient's functional outcome.   REHAB POTENTIAL: Good  CLINICAL DECISION MAKING: Evolving/moderate complexity increasing pain in her quads   EVALUATION COMPLEXITY: Moderate   GOALS: Goals reviewed with patient? Yes  SHORT TERM GOALS: Target date: 04/18/2023  Patient will transfer sit to stand smoothly and independently  Baseline: Goal status: depends on the day 9/12  2.  Patient will increase gross strength by 5lbs  Baseline:  Goal status: MET  3.  Patient will improve BERG score by 10  Baseline:  Goal status: MET LONG TERM GOALS: Target date: 05/16/2023   Patient will go improve balance to CGA for all balance exercises to  improve safety  Baseline:  Goal status: INITIAL  2.  Patient will report a 50% reduction in falls  Baseline:  Goal status: INITIAL  3.  Patient will have a full balance program  Baseline:  Goal status: INITIAL   PLAN: PT FREQUENCY: 2x/week  PT DURATION: 8 weeks  PLANNED INTERVENTIONS: Therapeutic exercises, Therapeutic activity, Neuromuscular re-education, Balance training, Gait training, Patient/Family education, Self Care, Joint mobilization, Stair training, Aquatic Therapy, Cryotherapy, Moist heat, Taping, Manual therapy, and Re-evaluation  PLAN FOR NEXT SESSION: work on improving sit to stand, posterior, dynamic, and static balance. Work on general conditioning. Be aware of leg pain on any given day. Provide an HEP next session as patient misplaced old one    Dessie Coma, PT 06/18/2023, 9:20 PM  [facility] [address] [place], [place], [address] Phone: [phone]   Fax:  [phone]

## 2023-06-21 ENCOUNTER — Ambulatory Visit (HOSPITAL_BASED_OUTPATIENT_CLINIC_OR_DEPARTMENT_OTHER): Payer: Medicare Other | Admitting: Physical Therapy

## 2023-06-21 ENCOUNTER — Encounter (HOSPITAL_BASED_OUTPATIENT_CLINIC_OR_DEPARTMENT_OTHER): Payer: Self-pay | Admitting: Physical Therapy

## 2023-06-21 DIAGNOSIS — M8580 Other specified disorders of bone density and structure, unspecified site: Secondary | ICD-10-CM | POA: Diagnosis not present

## 2023-06-21 DIAGNOSIS — G20B2 Parkinson's disease with dyskinesia, with fluctuations: Secondary | ICD-10-CM | POA: Diagnosis not present

## 2023-06-21 DIAGNOSIS — G20A1 Parkinson's disease without dyskinesia, without mention of fluctuations: Secondary | ICD-10-CM | POA: Diagnosis not present

## 2023-06-21 DIAGNOSIS — E785 Hyperlipidemia, unspecified: Secondary | ICD-10-CM | POA: Diagnosis not present

## 2023-06-21 DIAGNOSIS — F419 Anxiety disorder, unspecified: Secondary | ICD-10-CM | POA: Diagnosis not present

## 2023-06-21 DIAGNOSIS — N3281 Overactive bladder: Secondary | ICD-10-CM | POA: Diagnosis not present

## 2023-06-21 DIAGNOSIS — I10 Essential (primary) hypertension: Secondary | ICD-10-CM | POA: Diagnosis not present

## 2023-06-21 DIAGNOSIS — R2689 Other abnormalities of gait and mobility: Secondary | ICD-10-CM | POA: Diagnosis not present

## 2023-06-21 NOTE — Therapy (Signed)
OUTPATIENT PHYSICAL THERAPY LOWER EXTREMITY TREATMENT   Patient Name: Morgan [name] MRN: [id number] DOB:[birth year], 72 y.o., female Today's Date: 06/21/2023    PT End of Session - 06/21/23 1108     Visit Number 18    Number of Visits 40    Date for PT Re-Evaluation 08/08/23    Authorization Type Progress note done at visit 16 next at 26    PT Start Time 1104    PT Stop Time 1142    PT Time Calculation (min) 38 min    Activity Tolerance Patient tolerated treatment well    Behavior During Therapy WFL for tasks assessed/performed                  Past Medical History:  Diagnosis Date   Anxiety    Fibromyalgia    Headache    Hypercholesteremia    controlled on medication   Hyperlipidemia    Hypertension    no current meds   LBBB (left bundle branch block)    Melanoma (HCC)    MVP (mitral valve prolapse)    Osteopenia    Parkinson disease (HCC)    Pneumonia    SVD (spontaneous vaginal delivery)    x 2   Past Surgical History:  Procedure Laterality Date   BLEPHAROPLASTY     cold knife conization     COLONOSCOPY     FH colon CA   CYSTOSCOPY W/ URETERAL STENT PLACEMENT Left 10/18/2021   Procedure: CYSTOSCOPY WITH RETROGRADE PYELOGRAM, URETERAL STENT PLACEMENT;  Surgeon: Jerilee Field, MD;  Location: [facility];  Service: Urology;  Laterality: Left;   DILATATION & CURETTAGE/HYSTEROSCOPY WITH MYOSURE N/A 02/14/2018   Procedure: ATTEMPTED DILATATION & CURETTAGE/HYSTEROSCOPY WITH MYOSURE;  Surgeon: Gerald Leitz, MD;  Location: [facility];  Service: Gynecology;  Laterality: N/A;  polyp   melanoma cancer     removed from left leg   RECONSTRUCTION OF EYELID     TONSILLECTOMY     TUBAL LIGATION     URETEROSCOPY WITH HOLMIUM LASER LITHOTRIPSY Left 12/17/2021   Procedure: URETEROSCOPY/HOLMIUM LASER/STENT EXCHANGE;  Surgeon: Jerilee Field, MD;  Location: [facility];  Service: Urology;  Laterality: Left;   WISDOM TOOTH EXTRACTION     Patient Active Problem List    Diagnosis Date Noted   COVID-19 virus infection 05/16/2022   HTN (hypertension) 10/20/2021   Physical deconditioning 10/20/2021   Hydronephrosis with infection 10/18/2021   Bacteremia due to Escherichia coli 10/18/2021   Severe sepsis (HCC) 10/17/2021   UTI (urinary tract infection) 10/17/2021   Acute metabolic encephalopathy 10/17/2021   Parkinson disease (HCC) 10/17/2021   Cervical stenosis (uterine cervix) 02/14/2018   Postmenopausal bleeding 02/14/2018   LBBB (left bundle branch block) 12/25/2015   PVCs (premature ventricular contractions) 12/25/2015   Hyperlipidemia 12/25/2015   Fibromyalgia 01/16/2014   Cholelithiasis 04/25/2013   Paralysis agitans (HCC) 01/15/2013   Hereditary and idiopathic peripheral neuropathy 01/15/2013     [name] MD PCP:   Cindi Carbon PROVIDER: Antoine Primas MD   REFERRING DIAG: Parkinsons   THERAPY DIAG:  Other abnormalities of gait and mobility  Parkinson's disease without dyskinesia or fluctuating manifestations (HCC)  Rationale for Evaluation and Treatment Rehabilitation  ONSET DATE: Several years   SUBJECTIVE:   SUBJECTIVE STATEMENT: Patient states balance is off today because her legs are sore today. Whole back of the legs are sore.   PERTINENT HISTORY: Fibromyalgia; headaches, LBBB; osteopenia   PAIN:  Are you having pain? no: NPRS scale: 4/10 Location: Legs Pain  description:    Aggravating factors: just comes and goes  Relieving factors: just comes and goes; sometimes ibuprofen works but she is limited as to how much she can take  PRECAUTIONS: Patient reports that because of her heart she will pass out at times.   WEIGHT BEARING RESTRICTIONS No  FALLS:  Has patient fallen in last 6 months? Yes.  6 falls but likley more. Last fall    LIVING ENVIRONMENT: Stairs to the second floor in her house but she dosen't do much  4 steps in the front  2 steps in the garage  OCCUPATION: retired   Presenter, broadcasting: reading; goes to  an exercise class   PLOF: Independent with household mobility with device  PATIENT GOALS  To have less pain in her legs and to move better    OBJECTIVE:  (measurements taken at evaluation unless otherwise noted)   DIAGNOSTIC FINDINGS:   PATIENT SURVEYS:  FOTO    COGNITION:  Overall cognitive status: Within functional limits for tasks assessed     SENSATION: Denies paresthesias   POSTURE: No Significant postural limitations  PALPATION: No trigger points noted   LOWER EXTREMITY ROM:  Passive ROM Right eval Left eval  Hip flexion    Hip extension    Hip abduction    Hip adduction    Hip internal rotation    Hip external rotation    Knee flexion    Knee extension    Ankle dorsiflexion    Ankle plantarflexion    Ankle inversion    Ankle eversion     (Blank rows = not tested)  WNL  LOWER EXTREMITY MMT:  MMT Right eval Left eval Right  7/22 Left  7/2 Right  9/12 Left 9/12 Right 06/13/23 Left 06/13/23  Hip flexion 21.7 29.7 23.5 25.9 24.6 25.6 41.3 37.8  Hip extension          Hip abduction 27.6 18.5 19.1 8.7 21.2 14.4 29.9 29.5  Hip adduction          Hip internal rotation          Hip external rotation          Knee flexion          Knee extension 23.5 20.2 17.7 16.8 22.7 16.3 39.4 49.8  Ankle dorsiflexion          Ankle plantarflexion          Ankle inversion          Ankle eversion           (Blank rows = not tested)  GAIT: Decreased hip flexion; leans to the left; right hip frop. Patient required CGA while walking and min a when she turned for balance. No change from IE   Functional tests:  Sit to stand: cuing to catch balance. Uses momentum to stand.    5x sit to stand Balance: 13 sec   9/12 5x sit to stand 13 sec    Narrow base min a  Narrow base eyes closed min to mod a  Tandem stance Min a both ways.  Single leg stance 5 sec bilateral     06/13/23: 285 feet with walking stick, CGA  for safety  BERG Balance Test           Date:      Sit to Stand 1 2 06/13/23: 2  Standing unsupported 3 3 3   Sitting with back unsupported but feet supported 1 3 4   Stand to sit  1 2 3   Transfers  2 1 2   Standing unsupported with eyes closed 3 3 3   Standing unsupported feet together 3 3 3   From standing position, reach forward with outstretched arm 1 2 3   From standing position, pick up object from floor 1 2 3   From standing position, turn and look behind over each shoulder 1 2 2   Turn 360 2 1 2   Standing unsupported, alternately place foot on step 1 1 2   Standing unsupported, one foot in front 1 1 2   Standing on one leg 0 0 2  Total:  21/56  34/56  21/30                                                                                                         26/56    TODAY'S TREATMENT: 06/21/23 Nustep 6 minutes for dynamic warm up and conditioning level 5 Seated long sitting hamstring stretch 3 x 30 second holds Seated hamstring isometric 10 x 5 second holds Bilateral shoulder press 3# 2 x 15 bilateral  Bilateral punch 3# 3 x 15 Bilateral curl 3 #2 x 15   10/18 Airex: Narrow base of support eyes open 3 x 20-second hold Airex tandem stance 2 x 20-second hold  Heel/toe rock 2 x 15 Standing march 2 x 15  Lateral walk 5 feet with upper extremity assist and mod assist for balance  Hurdle step over 2 x 10  Bilateral press 2 x 15 Bilateral punch 3 x 15 Bilateral curl 2 x 15 All performed with 2 pound weight     06/13/23 Reassessment   06/06/23 BP 128/80 Nustep 5 minutes for dynamic warm up and conditioning level 5 Bilateral shoulder ER RTB 2 x 10  Seated shoulder horizontal abduction RTB 2 x 10 LAQ 2# 1 x 10 with 5 second holds Standing alternating march 2 x 10 bilateral Standing hip abduction 2 x 10 bilateral Squat with UE support 2 x 10 NBOS on foam 2 x 30 second holds   06/02/23 Nustep 5 minutes for dynamic warm up and conditioning level 5 Step taps 4 inch 2 x 10 STS with overhead reach 3 x  10 Row GTB 2 x 15 Shoulder extension GTB 2 x 15 Forward step with overhead reach 2 x 10 bilateral Bilateral shoulder press in seated 2# 3 x 10 Bicep curl 2# 2 x 15  10/3 Seated: Bilateral press 2 x 15 Bilateral punch 3 x 15 Bilateral curl 2 x 15 All performed with 2 pound weight with contact-guard assist for balance and sitting.  Patient sitting back unsupported.  LAQ red 2 x 15 Hip abduction red 2 x 15 Seated march 2 x 15  Airex: Narrow base of support 2 x 20 seconds hold Tandem stance 2 x 20-second hold  Heel raises 2 rock back for balance x 20 Standing slow march for balance 2 x 10 each leg   05/24/23 Nustep 5 minutes for dynamic warm up and conditioning level 5 Row GTB 2 x 10 Shoulder extension GTB 2 x 10 STS with  overhead reach 2 x 10 4 inch step taps with cueing for timing 1 x 12 bilateral Shoulder press with overhead reach 1 and 2 lb weights 2 x 10   9/16 Seated: Bilateral press 2 x 15 Bilateral punch 3 x 15 Bilateral curl 2 x 15 All performed with 1-2 pound weight with contact-guard assist for balance and sitting.  Patient sitting back unsupported.  Sit to stand 5x 3 sets   Airex narrow base 3 x 30 seconds Airex narrow base eyes closed 2 x 20 seconds mod assist for balance  Single hurdle step over attempted 2 x 10.  Patient had significant difficulty today.  Mod assist for balance. Standing march with upper extremity support 2 x 15 moderate cueing for hip flexion Heel toe rocking 2 x 15.  Mod cueing to go forward and back.  Mod assist for balance.    9/12  BERG testing 3 min walk test  5x sit to stand  MMT   4 inch toe touching 2x15 4 inch step up 2x10 each leg  4 inch lateral step ups 2x10 each leg     9/10 Gait: long strides 50'x2  Marching 50'x2  Side steps 50'x4 with seated rest breaks  Increased assistance required today for balance mod to max assist at times.  Gait  Single hurdle step over 2 x 15  Seated: Bilateral press 2 x  15 Bilateral punch 3 x 15 Bilateral curl 2 x 15 All performed with 1 pound weight with contact-guard assist for balance and sitting.  Patient sitting back unsupported.  Airex: Rock/back x 20 min assist for balance Narrow base 3 x 20 seconds eyes open 3 x 20 seconds eyes closed  9/5 Gait: long strides 50'x2  Marching 50'x2  Walking with head turns 30'x2  Walking looking up and down 30'x2  Hurdles 3 laps 6 4 hurdles side-to-side  Standing: march 3x15  Heel/toe 3x15  Squat 3x15    Coordination   Fwd fwd back back with rhythm x20   Sit to stand 5x    9/3  Laq 3x10 bilateral red  March 3x15 bilateral red  Hip abdcution 3x15 bilateral red   Mod cuing for range   Shoulder press 1 lb 2x15  Shoulder punch 2x10 bilateral biceps curl 3x15 1 lb   Stnading weight shift x20    8/29 Gait: long strides 50'x2  Marching 50'x2  Walking with head turns 30'x2  Walking looking up and down 30'x2  Hurdles 3 laps 6 4 hurdles side-to-side   Sit to stand transfers 3x4 after the first set we raised the table. She did better with posterior balance and controlled decent. Cuing for controlled decent.   8/26 NuStep 5 minutes  Air-ex rock: mod a 1 min  Air-ex normal base 3x30 sec CGA -> mod A Marching on Airex 2 x 10 some difficulty lifting her legs  Hurdles 3 laps 6 4 hurdles side-to-side  Step up 2x10 4 inch  Lateral step up 2x10 4 inch   Sit to stand transfers 3x4 after the first set we raised the table. She did better with posterior balance and controlled decent. Cuing for controlled decent.   8/22 NuStep 5 minutes  Air-ex rock: mod a 1 min  Air-ex normal base 3x30 sec CGA -> mod A Marching on Airex 2 x 10 some difficulty lifting her legs  Hurdles 3 laps 6 4 hurdles side-to-side Step over hurdle and back 2 x 10  Patient required min to mod assist  for all sit to stand transfers today.  Bicep curls 2 x 10 1 pounds D2 shoulder flexion 2 x 10 1 pound Punches 2 x 10 1  pound  LAQ 2 x 10  8/12 Nu-step 5 min   Sit to stand 3x3  improved tehcnoque with each trial. Posteiror loss of balance noted SBA - Max a   Air-ex rock: mod a 1 min  Air-ex normal base 3x30 sec CGA -> mod A   Coordination:   2 steps fwd 2 steps back 2x10 3 SF 3 SB 2x10   2 side steps 2x10   Seated balance:   Double arm press 1lb 2x10  Single arm press alt 2x10  Leg kicks with cuing to keep it slow 2x10   Hurdle tep over x10       PATIENT EDUCATION:  Education details: exercise rationale and technique   Person educated: Patient Education method: Explanation, Demonstration, Tactile cues, Verbal cues, Education comprehension: verbalized understanding, returned demonstration, verbal cues required, tactile cues required, and needs further education   HOME EXERCISE PROGRAM:  has a program already   ASSESSMENT:  CLINICAL IMPRESSION: Patient with increased balance impairment compared to prior sessions. Assist required for balance with ambulation. No change in hamstring soreness following targeted exercises. Continued with UE strengthening. Patient with increased fatigue toward end of session and she had to take one of her carbidopa-levodopa. Patient will continue to benefit from physical therapy in order to improve function and reduce impairment.   OBJECTIVE IMPAIRMENTS Abnormal gait, decreased activity tolerance, decreased balance, decreased mobility, difficulty walking, decreased strength, impaired tone, and pain.   ACTIVITY LIMITATIONS carrying, lifting, bending, standing, squatting, stairs, transfers, bed mobility, dressing, and locomotion level  PARTICIPATION LIMITATIONS: meal prep, cleaning, laundry, driving, shopping, community activity, and yard work  PERSONAL FACTORS Fibromyalgia; headaches, LBBB; osteopenia  are also affecting patient's functional outcome.   REHAB POTENTIAL: Good  CLINICAL DECISION MAKING: Evolving/moderate complexity increasing pain in her  quads   EVALUATION COMPLEXITY: Moderate   GOALS: Goals reviewed with patient? Yes  SHORT TERM GOALS: Target date: 04/18/2023  Patient will transfer sit to stand smoothly and independently  Baseline: Goal status: depends on the day 9/12  2.  Patient will increase gross strength by 5lbs  Baseline:  Goal status: MET  3.  Patient will improve BERG score by 10  Baseline:  Goal status: MET LONG TERM GOALS: Target date: 05/16/2023   Patient will go improve balance to CGA for all balance exercises to improve safety  Baseline:  Goal status: INITIAL  2.  Patient will report a 50% reduction in falls  Baseline:  Goal status: INITIAL  3.  Patient will have a full balance program  Baseline:  Goal status: INITIAL   PLAN: PT FREQUENCY: 2x/week  PT DURATION: 8 weeks  PLANNED INTERVENTIONS: Therapeutic exercises, Therapeutic activity, Neuromuscular re-education, Balance training, Gait training, Patient/Family education, Self Care, Joint mobilization, Stair training, Aquatic Therapy, Cryotherapy, Moist heat, Taping, Manual therapy, and Re-evaluation  PLAN FOR NEXT SESSION: work on improving sit to stand, posterior, dynamic, and static balance. Work on general conditioning. Be aware of leg pain on any given day. Provide an HEP next session as patient misplaced old one    Wyman Songster, PT 06/21/2023, 11:45 AM  [facility] [address] [place], [place], [address] Phone: [phone]   Fax:  [phone]

## 2023-06-23 ENCOUNTER — Encounter (HOSPITAL_BASED_OUTPATIENT_CLINIC_OR_DEPARTMENT_OTHER): Payer: Self-pay | Admitting: Physical Therapy

## 2023-06-23 ENCOUNTER — Ambulatory Visit (HOSPITAL_BASED_OUTPATIENT_CLINIC_OR_DEPARTMENT_OTHER): Payer: Medicare Other | Admitting: Physical Therapy

## 2023-06-23 DIAGNOSIS — R2689 Other abnormalities of gait and mobility: Secondary | ICD-10-CM | POA: Diagnosis not present

## 2023-06-23 DIAGNOSIS — G20A1 Parkinson's disease without dyskinesia, without mention of fluctuations: Secondary | ICD-10-CM | POA: Diagnosis not present

## 2023-06-23 NOTE — Therapy (Signed)
OUTPATIENT PHYSICAL THERAPY LOWER EXTREMITY TREATMENT   Patient Name: Morgan [name] MRN: [id number] DOB:Jun 23, 1951, 72 y.o., female Today's Date: 06/23/2023    PT End of Session - 06/23/23 1151     Visit Number 19    Number of Visits 40    Date for PT Re-Evaluation 08/08/23    Authorization Type Progress note done at visit 16 next at 26    PT Start Time 1148    PT Stop Time 1226    PT Time Calculation (min) 38 min    Activity Tolerance Patient tolerated treatment well    Behavior During Therapy WFL for tasks assessed/performed                  Past Medical History:  Diagnosis Date   Anxiety    Fibromyalgia    Headache    Hypercholesteremia    controlled on medication   Hyperlipidemia    Hypertension    no current meds   LBBB (left bundle branch block)    Melanoma (HCC)    MVP (mitral valve prolapse)    Osteopenia    Parkinson disease (HCC)    Pneumonia    SVD (spontaneous vaginal delivery)    x 2   Past Surgical History:  Procedure Laterality Date   BLEPHAROPLASTY     cold knife conization     COLONOSCOPY     FH colon CA   CYSTOSCOPY W/ URETERAL STENT PLACEMENT Left 10/18/2021   Procedure: CYSTOSCOPY WITH RETROGRADE PYELOGRAM, URETERAL STENT PLACEMENT;  Surgeon: Jerilee Field, MD;  Location: [facility];  Service: Urology;  Laterality: Left;   DILATATION & CURETTAGE/HYSTEROSCOPY WITH MYOSURE N/A 02/14/2018   Procedure: ATTEMPTED DILATATION & CURETTAGE/HYSTEROSCOPY WITH MYOSURE;  Surgeon: Gerald Leitz, MD;  Location: [facility];  Service: Gynecology;  Laterality: N/A;  polyp   melanoma cancer     removed from left leg   RECONSTRUCTION OF EYELID     TONSILLECTOMY     TUBAL LIGATION     URETEROSCOPY WITH HOLMIUM LASER LITHOTRIPSY Left 12/17/2021   Procedure: URETEROSCOPY/HOLMIUM LASER/STENT EXCHANGE;  Surgeon: Jerilee Field, MD;  Location: [facility];  Service: Urology;  Laterality: Left;   WISDOM TOOTH EXTRACTION     Patient Active Problem List    Diagnosis Date Noted   COVID-19 virus infection 05/16/2022   HTN (hypertension) 10/20/2021   Physical deconditioning 10/20/2021   Hydronephrosis with infection 10/18/2021   Bacteremia due to Escherichia coli 10/18/2021   Severe sepsis (HCC) 10/17/2021   UTI (urinary tract infection) 10/17/2021   Acute metabolic encephalopathy 10/17/2021   Parkinson disease (HCC) 10/17/2021   Cervical stenosis (uterine cervix) 02/14/2018   Postmenopausal bleeding 02/14/2018   LBBB (left bundle branch block) 12/25/2015   PVCs (premature ventricular contractions) 12/25/2015   Hyperlipidemia 12/25/2015   Fibromyalgia 01/16/2014   Cholelithiasis 04/25/2013   Paralysis agitans (HCC) 01/15/2013   Hereditary and idiopathic peripheral neuropathy 01/15/2013     [name] MD PCP:   Cindi Carbon PROVIDER: Antoine Primas MD   REFERRING DIAG: Parkinsons   THERAPY DIAG:  Other abnormalities of gait and mobility  Parkinson's disease without dyskinesia or fluctuating manifestations (HCC)  Rationale for Evaluation and Treatment Rehabilitation  ONSET DATE: Several years   SUBJECTIVE:   SUBJECTIVE STATEMENT: Patient states balance is off today because her legs are sore today. Whole back of the legs are sore.   PERTINENT HISTORY: Fibromyalgia; headaches, LBBB; osteopenia   PAIN:  Are you having pain? no: NPRS scale: 4/10 Location: Legs Pain  description:    Aggravating factors: just comes and goes  Relieving factors: just comes and goes; sometimes ibuprofen works but she is limited as to how much she can take  PRECAUTIONS: Patient reports that because of her heart she will pass out at times.   WEIGHT BEARING RESTRICTIONS No  FALLS:  Has patient fallen in last 6 months? Yes.  6 falls but likley more. Last fall    LIVING ENVIRONMENT: Stairs to the second floor in her house but she dosen't do much  4 steps in the front  2 steps in the garage  OCCUPATION: retired   Presenter, broadcasting: reading; goes to  an exercise class   PLOF: Independent with household mobility with device  PATIENT GOALS  To have less pain in her legs and to move better    OBJECTIVE:  (measurements taken at evaluation unless otherwise noted)   DIAGNOSTIC FINDINGS:   PATIENT SURVEYS:  FOTO    COGNITION:  Overall cognitive status: Within functional limits for tasks assessed     SENSATION: Denies paresthesias   POSTURE: No Significant postural limitations  PALPATION: No trigger points noted   LOWER EXTREMITY ROM:  Passive ROM Right eval Left eval  Hip flexion    Hip extension    Hip abduction    Hip adduction    Hip internal rotation    Hip external rotation    Knee flexion    Knee extension    Ankle dorsiflexion    Ankle plantarflexion    Ankle inversion    Ankle eversion     (Blank rows = not tested)  WNL  LOWER EXTREMITY MMT:  MMT Right eval Left eval Right  7/22 Left  7/2 Right  9/12 Left 9/12 Right 06/13/23 Left 06/13/23  Hip flexion 21.7 29.7 23.5 25.9 24.6 25.6 41.3 37.8  Hip extension          Hip abduction 27.6 18.5 19.1 8.7 21.2 14.4 29.9 29.5  Hip adduction          Hip internal rotation          Hip external rotation          Knee flexion          Knee extension 23.5 20.2 17.7 16.8 22.7 16.3 39.4 49.8  Ankle dorsiflexion          Ankle plantarflexion          Ankle inversion          Ankle eversion           (Blank rows = not tested)  GAIT: Decreased hip flexion; leans to the left; right hip frop. Patient required CGA while walking and min a when she turned for balance. No change from IE   Functional tests:  Sit to stand: cuing to catch balance. Uses momentum to stand.    5x sit to stand Balance: 13 sec   9/12 5x sit to stand 13 sec    Narrow base min a  Narrow base eyes closed min to mod a  Tandem stance Min a both ways.  Single leg stance 5 sec bilateral     06/13/23: 285 feet with walking stick, CGA  for safety  BERG Balance Test           Date:      Sit to Stand 1 2 06/13/23: 2  Standing unsupported 3 3 3   Sitting with back unsupported but feet supported 1 3 4   Stand to sit  1 2 3   Transfers  2 1 2   Standing unsupported with eyes closed 3 3 3   Standing unsupported feet together 3 3 3   From standing position, reach forward with outstretched arm 1 2 3   From standing position, pick up object from floor 1 2 3   From standing position, turn and look behind over each shoulder 1 2 2   Turn 360 2 1 2   Standing unsupported, alternately place foot on step 1 1 2   Standing unsupported, one foot in front 1 1 2   Standing on one leg 0 0 2  Total:  21/56  34/56  21/30                                                                                                         26/56    TODAY'S TREATMENT: 06/23/23 STS with overhead reach 3 x 10 Forward step with overhead reach 2 x 10 bilateral Seated Row GTB 2 x 15 Lateral step with overhead reach 2 x 10 bilateral Bilateral shoulder press 3# 2 x 15 bilateral  Seated shoulder horizontal abduction RTB 2 x 10   06/21/23 Nustep 6 minutes for dynamic warm up and conditioning level 5 Seated long sitting hamstring stretch 3 x 30 second holds Seated hamstring isometric 10 x 5 second holds Bilateral shoulder press 3# 2 x 15 bilateral  Bilateral punch 3# 3 x 15 Bilateral curl 3 #2 x 15   10/18 Airex: Narrow base of support eyes open 3 x 20-second hold Airex tandem stance 2 x 20-second hold  Heel/toe rock 2 x 15 Standing march 2 x 15  Lateral walk 5 feet with upper extremity assist and mod assist for balance  Hurdle step over 2 x 10  Bilateral press 2 x 15 Bilateral punch 3 x 15 Bilateral curl 2 x 15 All performed with 2 pound weight     06/13/23 Reassessment   06/06/23 BP 128/80 Nustep 5 minutes for dynamic warm up and conditioning level 5 Bilateral shoulder ER RTB 2 x 10  Seated shoulder horizontal abduction RTB 2 x 10 LAQ 2# 1 x 10 with 5 second holds Standing  alternating march 2 x 10 bilateral Standing hip abduction 2 x 10 bilateral Squat with UE support 2 x 10 NBOS on foam 2 x 30 second holds   06/02/23 Nustep 5 minutes for dynamic warm up and conditioning level 5 Step taps 4 inch 2 x 10 STS with overhead reach 3 x 10 Row GTB 2 x 15 Shoulder extension GTB 2 x 15 Forward step with overhead reach 2 x 10 bilateral Bilateral shoulder press in seated 2# 3 x 10 Bicep curl 2# 2 x 15  10/3 Seated: Bilateral press 2 x 15 Bilateral punch 3 x 15 Bilateral curl 2 x 15 All performed with 2 pound weight with contact-guard assist for balance and sitting.  Patient sitting back unsupported.  LAQ red 2 x 15 Hip abduction red 2 x 15 Seated march 2 x 15  Airex: Narrow base of support 2 x 20 seconds hold Tandem stance  2 x 20-second hold  Heel raises 2 rock back for balance x 20 Standing slow march for balance 2 x 10 each leg   05/24/23 Nustep 5 minutes for dynamic warm up and conditioning level 5 Row GTB 2 x 10 Shoulder extension GTB 2 x 10 STS with overhead reach 2 x 10 4 inch step taps with cueing for timing 1 x 12 bilateral Shoulder press with overhead reach 1 and 2 lb weights 2 x 10   9/16 Seated: Bilateral press 2 x 15 Bilateral punch 3 x 15 Bilateral curl 2 x 15 All performed with 1-2 pound weight with contact-guard assist for balance and sitting.  Patient sitting back unsupported.  Sit to stand 5x 3 sets   Airex narrow base 3 x 30 seconds Airex narrow base eyes closed 2 x 20 seconds mod assist for balance  Single hurdle step over attempted 2 x 10.  Patient had significant difficulty today.  Mod assist for balance. Standing march with upper extremity support 2 x 15 moderate cueing for hip flexion Heel toe rocking 2 x 15.  Mod cueing to go forward and back.  Mod assist for balance.    9/12  BERG testing 3 min walk test  5x sit to stand  MMT   4 inch toe touching 2x15 4 inch step up 2x10 each leg  4 inch lateral step  ups 2x10 each leg     9/10 Gait: long strides 50'x2  Marching 50'x2  Side steps 50'x4 with seated rest breaks  Increased assistance required today for balance mod to max assist at times.  Gait  Single hurdle step over 2 x 15  Seated: Bilateral press 2 x 15 Bilateral punch 3 x 15 Bilateral curl 2 x 15 All performed with 1 pound weight with contact-guard assist for balance and sitting.  Patient sitting back unsupported.  Airex: Rock/back x 20 min assist for balance Narrow base 3 x 20 seconds eyes open 3 x 20 seconds eyes closed  9/5 Gait: long strides 50'x2  Marching 50'x2  Walking with head turns 30'x2  Walking looking up and down 30'x2  Hurdles 3 laps 6 4 hurdles side-to-side  Standing: march 3x15  Heel/toe 3x15  Squat 3x15    Coordination   Fwd fwd back back with rhythm x20   Sit to stand 5x    9/3  Laq 3x10 bilateral red  March 3x15 bilateral red  Hip abdcution 3x15 bilateral red   Mod cuing for range   Shoulder press 1 lb 2x15  Shoulder punch 2x10 bilateral biceps curl 3x15 1 lb   Stnading weight shift x20    8/29 Gait: long strides 50'x2  Marching 50'x2  Walking with head turns 30'x2  Walking looking up and down 30'x2  Hurdles 3 laps 6 4 hurdles side-to-side   Sit to stand transfers 3x4 after the first set we raised the table. She did better with posterior balance and controlled decent. Cuing for controlled decent.   8/26 NuStep 5 minutes  Air-ex rock: mod a 1 min  Air-ex normal base 3x30 sec CGA -> mod A Marching on Airex 2 x 10 some difficulty lifting her legs  Hurdles 3 laps 6 4 hurdles side-to-side  Step up 2x10 4 inch  Lateral step up 2x10 4 inch   Sit to stand transfers 3x4 after the first set we raised the table. She did better with posterior balance and controlled decent. Cuing for controlled decent.   8/22 NuStep 5 minutes  Air-ex rock: mod a 1 min  Air-ex normal base 3x30 sec CGA -> mod A Marching on Airex 2 x 10 some  difficulty lifting her legs  Hurdles 3 laps 6 4 hurdles side-to-side Step over hurdle and back 2 x 10  Patient required min to mod assist for all sit to stand transfers today.  Bicep curls 2 x 10 1 pounds D2 shoulder flexion 2 x 10 1 pound Punches 2 x 10 1 pound  LAQ 2 x 10  8/12 Nu-step 5 min   Sit to stand 3x3  improved tehcnoque with each trial. Posteiror loss of balance noted SBA - Max a   Air-ex rock: mod a 1 min  Air-ex normal base 3x30 sec CGA -> mod A   Coordination:   2 steps fwd 2 steps back 2x10 3 SF 3 SB 2x10   2 side steps 2x10   Seated balance:   Double arm press 1lb 2x10  Single arm press alt 2x10  Leg kicks with cuing to keep it slow 2x10   Hurdle tep over x10       PATIENT EDUCATION:  Education details: exercise rationale and technique   Person educated: Patient Education method: Explanation, Demonstration, Tactile cues, Verbal cues, Education comprehension: verbalized understanding, returned demonstration, verbal cues required, tactile cues required, and needs further education   HOME EXERCISE PROGRAM:  has a program already   ASSESSMENT:  CLINICAL IMPRESSION: Patient with improved balance and activity tolerance today compared to last few sessions. Unsteady with gait but good with balance exercises but still requires min g/A for safety. Moderate to high fatigue at end of session. Patient will continue to benefit from physical therapy in order to improve function and reduce impairment.   OBJECTIVE IMPAIRMENTS Abnormal gait, decreased activity tolerance, decreased balance, decreased mobility, difficulty walking, decreased strength, impaired tone, and pain.   ACTIVITY LIMITATIONS carrying, lifting, bending, standing, squatting, stairs, transfers, bed mobility, dressing, and locomotion level  PARTICIPATION LIMITATIONS: meal prep, cleaning, laundry, driving, shopping, community activity, and yard work  PERSONAL FACTORS Fibromyalgia;  headaches, LBBB; osteopenia  are also affecting patient's functional outcome.   REHAB POTENTIAL: Good  CLINICAL DECISION MAKING: Evolving/moderate complexity increasing pain in her quads   EVALUATION COMPLEXITY: Moderate   GOALS: Goals reviewed with patient? Yes  SHORT TERM GOALS: Target date: 04/18/2023  Patient will transfer sit to stand smoothly and independently  Baseline: Goal status: depends on the day 9/12  2.  Patient will increase gross strength by 5lbs  Baseline:  Goal status: MET  3.  Patient will improve BERG score by 10  Baseline:  Goal status: MET LONG TERM GOALS: Target date: 05/16/2023   Patient will go improve balance to CGA for all balance exercises to improve safety  Baseline:  Goal status: INITIAL  2.  Patient will report a 50% reduction in falls  Baseline:  Goal status: INITIAL  3.  Patient will have a full balance program  Baseline:  Goal status: INITIAL   PLAN: PT FREQUENCY: 2x/week  PT DURATION: 8 weeks  PLANNED INTERVENTIONS: Therapeutic exercises, Therapeutic activity, Neuromuscular re-education, Balance training, Gait training, Patient/Family education, Self Care, Joint mobilization, Stair training, Aquatic Therapy, Cryotherapy, Moist heat, Taping, Manual therapy, and Re-evaluation  PLAN FOR NEXT SESSION: work on improving sit to stand, posterior, dynamic, and static balance. Work on general conditioning. Be aware of leg pain on any given day. Provide an HEP next session as patient misplaced old one    Wyman Songster, PT  06/23/2023, 12:25 PM  [facility] [address] [place], [place], [address] Phone: [phone]   Fax:  [phone]

## 2023-06-28 ENCOUNTER — Ambulatory Visit (HOSPITAL_BASED_OUTPATIENT_CLINIC_OR_DEPARTMENT_OTHER): Payer: Medicare Other | Admitting: Physical Therapy

## 2023-06-28 ENCOUNTER — Encounter (HOSPITAL_BASED_OUTPATIENT_CLINIC_OR_DEPARTMENT_OTHER): Payer: Self-pay | Admitting: Physical Therapy

## 2023-06-28 DIAGNOSIS — R2689 Other abnormalities of gait and mobility: Secondary | ICD-10-CM | POA: Diagnosis not present

## 2023-06-28 DIAGNOSIS — G20A1 Parkinson's disease without dyskinesia, without mention of fluctuations: Secondary | ICD-10-CM | POA: Diagnosis not present

## 2023-06-28 NOTE — Therapy (Signed)
OUTPATIENT PHYSICAL THERAPY LOWER EXTREMITY TREATMENT   Patient Name: Morgan [name] MRN: [id number] DOB:08-31-1950, 72 y.o., female Today's Date: 06/28/2023    PT End of Session - 06/28/23 1106     Visit Number 20    Number of Visits 40    Date for PT Re-Evaluation 08/08/23    Authorization Type Progress note done at visit 16 next at 26    PT Start Time 1104    PT Stop Time 1144    PT Time Calculation (min) 40 min    Activity Tolerance Patient tolerated treatment well    Behavior During Therapy WFL for tasks assessed/performed                  Past Medical History:  Diagnosis Date   Anxiety    Fibromyalgia    Headache    Hypercholesteremia    controlled on medication   Hyperlipidemia    Hypertension    no current meds   LBBB (left bundle branch block)    Melanoma (HCC)    MVP (mitral valve prolapse)    Osteopenia    Parkinson disease (HCC)    Pneumonia    SVD (spontaneous vaginal delivery)    x 2   Past Surgical History:  Procedure Laterality Date   BLEPHAROPLASTY     cold knife conization     COLONOSCOPY     FH colon CA   CYSTOSCOPY W/ URETERAL STENT PLACEMENT Left 10/18/2021   Procedure: CYSTOSCOPY WITH RETROGRADE PYELOGRAM, URETERAL STENT PLACEMENT;  Surgeon: Jerilee Field, MD;  Location: [facility];  Service: Urology;  Laterality: Left;   DILATATION & CURETTAGE/HYSTEROSCOPY WITH MYOSURE N/A 02/14/2018   Procedure: ATTEMPTED DILATATION & CURETTAGE/HYSTEROSCOPY WITH MYOSURE;  Surgeon: Gerald Leitz, MD;  Location: [facility];  Service: Gynecology;  Laterality: N/A;  polyp   melanoma cancer     removed from left leg   RECONSTRUCTION OF EYELID     TONSILLECTOMY     TUBAL LIGATION     URETEROSCOPY WITH HOLMIUM LASER LITHOTRIPSY Left 12/17/2021   Procedure: URETEROSCOPY/HOLMIUM LASER/STENT EXCHANGE;  Surgeon: Jerilee Field, MD;  Location: [facility];  Service: Urology;  Laterality: Left;   WISDOM TOOTH EXTRACTION     Patient Active Problem List    Diagnosis Date Noted   COVID-19 virus infection 05/16/2022   HTN (hypertension) 10/20/2021   Physical deconditioning 10/20/2021   Hydronephrosis with infection 10/18/2021   Bacteremia due to Escherichia coli 10/18/2021   Severe sepsis (HCC) 10/17/2021   UTI (urinary tract infection) 10/17/2021   Acute metabolic encephalopathy 10/17/2021   Parkinson disease (HCC) 10/17/2021   Cervical stenosis (uterine cervix) 02/14/2018   Postmenopausal bleeding 02/14/2018   LBBB (left bundle branch block) 12/25/2015   PVCs (premature ventricular contractions) 12/25/2015   Hyperlipidemia 12/25/2015   Fibromyalgia 01/16/2014   Cholelithiasis 04/25/2013   Paralysis agitans (HCC) 01/15/2013   Hereditary and idiopathic peripheral neuropathy 01/15/2013     [name] MD PCP:   Cindi Carbon PROVIDER: Antoine Primas MD   REFERRING DIAG: Parkinsons   THERAPY DIAG:  Other abnormalities of gait and mobility  Parkinson's disease without dyskinesia or fluctuating manifestations (HCC)  Rationale for Evaluation and Treatment Rehabilitation  ONSET DATE: Several years   SUBJECTIVE:   SUBJECTIVE STATEMENT: Patient states today is a decent day. Was warn out after last session.   PERTINENT HISTORY: Fibromyalgia; headaches, LBBB; osteopenia   PAIN:  Are you having pain? no: NPRS scale: 4/10 Location: Legs Pain description:    Aggravating factors:  just comes and goes  Relieving factors: just comes and goes; sometimes ibuprofen works but she is limited as to how much she can take  PRECAUTIONS: Patient reports that because of her heart she will pass out at times.   WEIGHT BEARING RESTRICTIONS No  FALLS:  Has patient fallen in last 6 months? Yes.  6 falls but likley more. Last fall    LIVING ENVIRONMENT: Stairs to the second floor in her house but she dosen't do much  4 steps in the front  2 steps in the garage  OCCUPATION: retired   Presenter, broadcasting: reading; goes to an exercise class   PLOF:  Independent with household mobility with device  PATIENT GOALS  To have less pain in her legs and to move better    OBJECTIVE:  (measurements taken at evaluation unless otherwise noted)   DIAGNOSTIC FINDINGS:   PATIENT SURVEYS:  FOTO    COGNITION:  Overall cognitive status: Within functional limits for tasks assessed     SENSATION: Denies paresthesias   POSTURE: No Significant postural limitations  PALPATION: No trigger points noted   LOWER EXTREMITY ROM:  Passive ROM Right eval Left eval  Hip flexion    Hip extension    Hip abduction    Hip adduction    Hip internal rotation    Hip external rotation    Knee flexion    Knee extension    Ankle dorsiflexion    Ankle plantarflexion    Ankle inversion    Ankle eversion     (Blank rows = not tested)  WNL  LOWER EXTREMITY MMT:  MMT Right eval Left eval Right  7/22 Left  7/2 Right  9/12 Left 9/12 Right 06/13/23 Left 06/13/23  Hip flexion 21.7 29.7 23.5 25.9 24.6 25.6 41.3 37.8  Hip extension          Hip abduction 27.6 18.5 19.1 8.7 21.2 14.4 29.9 29.5  Hip adduction          Hip internal rotation          Hip external rotation          Knee flexion          Knee extension 23.5 20.2 17.7 16.8 22.7 16.3 39.4 49.8  Ankle dorsiflexion          Ankle plantarflexion          Ankle inversion          Ankle eversion           (Blank rows = not tested)  GAIT: Decreased hip flexion; leans to the left; right hip frop. Patient required CGA while walking and min a when she turned for balance. No change from IE   Functional tests:  Sit to stand: cuing to catch balance. Uses momentum to stand.    5x sit to stand Balance: 13 sec   9/12 5x sit to stand 13 sec    Narrow base min a  Narrow base eyes closed min to mod a  Tandem stance Min a both ways.  Single leg stance 5 sec bilateral     06/13/23: 285 feet with walking stick, CGA  for safety  BERG Balance Test          Date:      Sit to Stand 1  2 06/13/23: 2  Standing unsupported 3 3 3   Sitting with back unsupported but feet supported 1 3 4   Stand to sit  1 2 3   Transfers  2 1 2   Standing unsupported with eyes closed 3 3 3   Standing unsupported feet together 3 3 3   From standing position, reach forward with outstretched arm 1 2 3   From standing position, pick up object from floor 1 2 3   From standing position, turn and look behind over each shoulder 1 2 2   Turn 360 2 1 2   Standing unsupported, alternately place foot on step 1 1 2   Standing unsupported, one foot in front 1 1 2   Standing on one leg 0 0 2  Total:  21/56  34/56  21/30                                                                                                         26/56    TODAY'S TREATMENT: 06/28/23 Nustep 6 minutes for dynamic warm up and conditioning level 5 STS with overhead reach 3 x 10 Seated march RTB 2 x 15 Seated hip abduction RTB 2 x 15 LAQ 1 x 10 bilateral Shoulder bilateral ER RTB 2 x 10 Shoulder horizontal abduction RTB 2 x 10   06/23/23 STS with overhead reach 3 x 10 Forward step with overhead reach 2 x 10 bilateral Seated Row GTB 2 x 15 Lateral step with overhead reach 2 x 10 bilateral Bilateral shoulder press 3# 2 x 15 bilateral  Seated shoulder horizontal abduction RTB 2 x 10   06/21/23 Nustep 6 minutes for dynamic warm up and conditioning level 5 Seated long sitting hamstring stretch 3 x 30 second holds Seated hamstring isometric 10 x 5 second holds Bilateral shoulder press 3# 2 x 15 bilateral  Bilateral punch 3# 3 x 15 Bilateral curl 3 #2 x 15   10/18 Airex: Narrow base of support eyes open 3 x 20-second hold Airex tandem stance 2 x 20-second hold  Heel/toe rock 2 x 15 Standing march 2 x 15  Lateral walk 5 feet with upper extremity assist and mod assist for balance  Hurdle step over 2 x 10  Bilateral press 2 x 15 Bilateral punch 3 x 15 Bilateral curl 2 x 15 All performed with 2 pound weight      06/13/23 Reassessment   06/06/23 BP 128/80 Nustep 5 minutes for dynamic warm up and conditioning level 5 Bilateral shoulder ER RTB 2 x 10  Seated shoulder horizontal abduction RTB 2 x 10 LAQ 2# 1 x 10 with 5 second holds Standing alternating march 2 x 10 bilateral Standing hip abduction 2 x 10 bilateral Squat with UE support 2 x 10 NBOS on foam 2 x 30 second holds   06/02/23 Nustep 5 minutes for dynamic warm up and conditioning level 5 Step taps 4 inch 2 x 10 STS with overhead reach 3 x 10 Row GTB 2 x 15 Shoulder extension GTB 2 x 15 Forward step with overhead reach 2 x 10 bilateral Bilateral shoulder press in seated 2# 3 x 10 Bicep curl 2# 2 x 15  10/3 Seated: Bilateral press 2 x 15 Bilateral punch 3 x 15 Bilateral curl 2 x 15 All  performed with 2 pound weight with contact-guard assist for balance and sitting.  Patient sitting back unsupported.  LAQ red 2 x 15 Hip abduction red 2 x 15 Seated march 2 x 15  Airex: Narrow base of support 2 x 20 seconds hold Tandem stance 2 x 20-second hold  Heel raises 2 rock back for balance x 20 Standing slow march for balance 2 x 10 each leg   05/24/23 Nustep 5 minutes for dynamic warm up and conditioning level 5 Row GTB 2 x 10 Shoulder extension GTB 2 x 10 STS with overhead reach 2 x 10 4 inch step taps with cueing for timing 1 x 12 bilateral Shoulder press with overhead reach 1 and 2 lb weights 2 x 10   9/16 Seated: Bilateral press 2 x 15 Bilateral punch 3 x 15 Bilateral curl 2 x 15 All performed with 1-2 pound weight with contact-guard assist for balance and sitting.  Patient sitting back unsupported.  Sit to stand 5x 3 sets   Airex narrow base 3 x 30 seconds Airex narrow base eyes closed 2 x 20 seconds mod assist for balance  Single hurdle step over attempted 2 x 10.  Patient had significant difficulty today.  Mod assist for balance. Standing march with upper extremity support 2 x 15 moderate cueing for hip  flexion Heel toe rocking 2 x 15.  Mod cueing to go forward and back.  Mod assist for balance.    9/12  BERG testing 3 min walk test  5x sit to stand  MMT   4 inch toe touching 2x15 4 inch step up 2x10 each leg  4 inch lateral step ups 2x10 each leg     9/10 Gait: long strides 50'x2  Marching 50'x2  Side steps 50'x4 with seated rest breaks  Increased assistance required today for balance mod to max assist at times.  Gait  Single hurdle step over 2 x 15  Seated: Bilateral press 2 x 15 Bilateral punch 3 x 15 Bilateral curl 2 x 15 All performed with 1 pound weight with contact-guard assist for balance and sitting.  Patient sitting back unsupported.  Airex: Rock/back x 20 min assist for balance Narrow base 3 x 20 seconds eyes open 3 x 20 seconds eyes closed       PATIENT EDUCATION:  Education details: exercise rationale and technique   Person educated: Patient Education method: Explanation, Demonstration, Tactile cues, Verbal cues, Education comprehension: verbalized understanding, returned demonstration, verbal cues required, tactile cues required, and needs further education   HOME EXERCISE PROGRAM: Access Code: [email or web address] URL: [email or web address] Date: 06/28/2023 - Seated March with Resistance  - 1 x daily - 7 x weekly - 3 sets - 15 reps - Seated Hip Abduction with Resistance  - 1 x daily - 7 x weekly - 3 sets - 15 reps - Seated Long Arc Quad  - 1 x daily - 7 x weekly - 3 sets - 15 reps - Shoulder External Rotation and Scapular Retraction with Resistance  - 1 x daily - 7 x weekly - 2 sets - 10 reps - Seated Shoulder Horizontal Abduction with Resistance  - 1 x daily - 7 x weekly - 2 sets - 10 reps  ASSESSMENT:  CLINICAL IMPRESSION: Continued with functional strengthening and large amplitude movements which are tolerated well. Intermittent cueing for timing of exercise and mechanics. Worked on development of HEP that patient could perform safely  at home. Patient will continue to benefit from physical therapy in  order to improve function and reduce impairment.   OBJECTIVE IMPAIRMENTS Abnormal gait, decreased activity tolerance, decreased balance, decreased mobility, difficulty walking, decreased strength, impaired tone, and pain.   ACTIVITY LIMITATIONS carrying, lifting, bending, standing, squatting, stairs, transfers, bed mobility, dressing, and locomotion level  PARTICIPATION LIMITATIONS: meal prep, cleaning, laundry, driving, shopping, community activity, and yard work  PERSONAL FACTORS Fibromyalgia; headaches, LBBB; osteopenia  are also affecting patient's functional outcome.   REHAB POTENTIAL: Good  CLINICAL DECISION MAKING: Evolving/moderate complexity increasing pain in her quads   EVALUATION COMPLEXITY: Moderate   GOALS: Goals reviewed with patient? Yes  SHORT TERM GOALS: Target date: 04/18/2023  Patient will transfer sit to stand smoothly and independently  Baseline: Goal status: depends on the day 9/12  2.  Patient will increase gross strength by 5lbs  Baseline:  Goal status: MET  3.  Patient will improve BERG score by 10  Baseline:  Goal status: MET LONG TERM GOALS: Target date: 05/16/2023   Patient will go improve balance to CGA for all balance exercises to improve safety  Baseline:  Goal status: INITIAL  2.  Patient will report a 50% reduction in falls  Baseline:  Goal status: INITIAL  3.  Patient will have a full balance program  Baseline:  Goal status: INITIAL   PLAN: PT FREQUENCY: 2x/week  PT DURATION: 8 weeks  PLANNED INTERVENTIONS: Therapeutic exercises, Therapeutic activity, Neuromuscular re-education, Balance training, Gait training, Patient/Family education, Self Care, Joint mobilization, Stair training, Aquatic Therapy, Cryotherapy, Moist heat, Taping, Manual therapy, and Re-evaluation  PLAN FOR NEXT SESSION: work on improving sit to stand, posterior, dynamic, and static balance. Work  on general conditioning. Be aware of leg pain on any given day.     Wyman Songster, PT 06/28/2023, 11:07 AM  [facility] [address] [place], [place], [address] Phone: [phone]   Fax:  [phone]

## 2023-06-30 ENCOUNTER — Ambulatory Visit (HOSPITAL_BASED_OUTPATIENT_CLINIC_OR_DEPARTMENT_OTHER): Payer: Medicare Other | Attending: Family Medicine | Admitting: Physical Therapy

## 2023-06-30 ENCOUNTER — Encounter (HOSPITAL_BASED_OUTPATIENT_CLINIC_OR_DEPARTMENT_OTHER): Payer: Self-pay | Admitting: Physical Therapy

## 2023-06-30 DIAGNOSIS — G20A1 Parkinson's disease without dyskinesia, without mention of fluctuations: Secondary | ICD-10-CM | POA: Insufficient documentation

## 2023-06-30 DIAGNOSIS — R2689 Other abnormalities of gait and mobility: Secondary | ICD-10-CM | POA: Insufficient documentation

## 2023-06-30 NOTE — Therapy (Signed)
OUTPATIENT PHYSICAL THERAPY LOWER EXTREMITY TREATMENT   Patient Name: Morgan [name] MRN: [id number] DOB:Sep 17, 1950, 72 y.o., female Today's Date: 06/30/2023    PT End of Session - 06/30/23 1101     Visit Number 21    Number of Visits 40    Date for PT Re-Evaluation 08/08/23    Authorization Type Progress note done at visit 16 next at 26    PT Start Time 1101    PT Stop Time 1132    PT Time Calculation (min) 31 min    Activity Tolerance Patient tolerated treatment well    Behavior During Therapy WFL for tasks assessed/performed                  Past Medical History:  Diagnosis Date   Anxiety    Fibromyalgia    Headache    Hypercholesteremia    controlled on medication   Hyperlipidemia    Hypertension    no current meds   LBBB (left bundle branch block)    Melanoma (HCC)    MVP (mitral valve prolapse)    Osteopenia    Parkinson disease (HCC)    Pneumonia    SVD (spontaneous vaginal delivery)    x 2   Past Surgical History:  Procedure Laterality Date   BLEPHAROPLASTY     cold knife conization     COLONOSCOPY     FH colon CA   CYSTOSCOPY W/ URETERAL STENT PLACEMENT Left 10/18/2021   Procedure: CYSTOSCOPY WITH RETROGRADE PYELOGRAM, URETERAL STENT PLACEMENT;  Surgeon: Jerilee Field, MD;  Location: [facility];  Service: Urology;  Laterality: Left;   DILATATION & CURETTAGE/HYSTEROSCOPY WITH MYOSURE N/A 02/14/2018   Procedure: ATTEMPTED DILATATION & CURETTAGE/HYSTEROSCOPY WITH MYOSURE;  Surgeon: Gerald Leitz, MD;  Location: [facility];  Service: Gynecology;  Laterality: N/A;  polyp   melanoma cancer     removed from left leg   RECONSTRUCTION OF EYELID     TONSILLECTOMY     TUBAL LIGATION     URETEROSCOPY WITH HOLMIUM LASER LITHOTRIPSY Left 12/17/2021   Procedure: URETEROSCOPY/HOLMIUM LASER/STENT EXCHANGE;  Surgeon: Jerilee Field, MD;  Location: [facility];  Service: Urology;  Laterality: Left;   WISDOM TOOTH EXTRACTION     Patient Active Problem List    Diagnosis Date Noted   COVID-19 virus infection 05/16/2022   HTN (hypertension) 10/20/2021   Physical deconditioning 10/20/2021   Hydronephrosis with infection 10/18/2021   Bacteremia due to Escherichia coli 10/18/2021   Severe sepsis (HCC) 10/17/2021   UTI (urinary tract infection) 10/17/2021   Acute metabolic encephalopathy 10/17/2021   Parkinson disease (HCC) 10/17/2021   Cervical stenosis (uterine cervix) 02/14/2018   Postmenopausal bleeding 02/14/2018   LBBB (left bundle branch block) 12/25/2015   PVCs (premature ventricular contractions) 12/25/2015   Hyperlipidemia 12/25/2015   Fibromyalgia 01/16/2014   Cholelithiasis 04/25/2013   Paralysis agitans (HCC) 01/15/2013   Hereditary and idiopathic peripheral neuropathy 01/15/2013     [name] MD PCP:   Cindi Carbon PROVIDER: Antoine Primas MD   REFERRING DIAG: Parkinsons   THERAPY DIAG:  Other abnormalities of gait and mobility  Parkinson's disease without dyskinesia or fluctuating manifestations (HCC)  Rationale for Evaluation and Treatment Rehabilitation  ONSET DATE: Several years   SUBJECTIVE:   SUBJECTIVE STATEMENT: Patient states doing alright today. Average day in terms of function. Did not do HEP but rode her bike yesterday.  PERTINENT HISTORY: Fibromyalgia; headaches, LBBB; osteopenia   PAIN:  Are you having pain? no: NPRS scale: 4/10 Location: Legs Pain  description:    Aggravating factors: just comes and goes  Relieving factors: just comes and goes; sometimes ibuprofen works but she is limited as to how much she can take  PRECAUTIONS: Patient reports that because of her heart she will pass out at times.   WEIGHT BEARING RESTRICTIONS No  FALLS:  Has patient fallen in last 6 months? Yes.  6 falls but likley more. Last fall    LIVING ENVIRONMENT: Stairs to the second floor in her house but she dosen't do much  4 steps in the front  2 steps in the garage  OCCUPATION: retired   Presenter, broadcasting:  reading; goes to an exercise class   PLOF: Independent with household mobility with device  PATIENT GOALS  To have less pain in her legs and to move better    OBJECTIVE:  (measurements taken at evaluation unless otherwise noted)   DIAGNOSTIC FINDINGS:   PATIENT SURVEYS:  FOTO    COGNITION:  Overall cognitive status: Within functional limits for tasks assessed     SENSATION: Denies paresthesias   POSTURE: No Significant postural limitations  PALPATION: No trigger points noted   LOWER EXTREMITY ROM:  Passive ROM Right eval Left eval  Hip flexion    Hip extension    Hip abduction    Hip adduction    Hip internal rotation    Hip external rotation    Knee flexion    Knee extension    Ankle dorsiflexion    Ankle plantarflexion    Ankle inversion    Ankle eversion     (Blank rows = not tested)  WNL  LOWER EXTREMITY MMT:  MMT Right eval Left eval Right  7/22 Left  7/2 Right  9/12 Left 9/12 Right 06/13/23 Left 06/13/23  Hip flexion 21.7 29.7 23.5 25.9 24.6 25.6 41.3 37.8  Hip extension          Hip abduction 27.6 18.5 19.1 8.7 21.2 14.4 29.9 29.5  Hip adduction          Hip internal rotation          Hip external rotation          Knee flexion          Knee extension 23.5 20.2 17.7 16.8 22.7 16.3 39.4 49.8  Ankle dorsiflexion          Ankle plantarflexion          Ankle inversion          Ankle eversion           (Blank rows = not tested)  GAIT: Decreased hip flexion; leans to the left; right hip frop. Patient required CGA while walking and min a when she turned for balance. No change from IE   Functional tests:  Sit to stand: cuing to catch balance. Uses momentum to stand.    5x sit to stand Balance: 13 sec   9/12 5x sit to stand 13 sec    Narrow base min a  Narrow base eyes closed min to mod a  Tandem stance Min a both ways.  Single leg stance 5 sec bilateral     06/13/23: 285 feet with walking stick, CGA  for safety  BERG  Balance Test          Date:      Sit to Stand 1 2 06/13/23: 2  Standing unsupported 3 3 3   Sitting with back unsupported but feet supported 1 3 4   Stand to sit  1 2 3   Transfers  2 1 2   Standing unsupported with eyes closed 3 3 3   Standing unsupported feet together 3 3 3   From standing position, reach forward with outstretched arm 1 2 3   From standing position, pick up object from floor 1 2 3   From standing position, turn and look behind over each shoulder 1 2 2   Turn 360 2 1 2   Standing unsupported, alternately place foot on step 1 1 2   Standing unsupported, one foot in front 1 1 2   Standing on one leg 0 0 2  Total:  21/56  34/56  21/30                                                                                                         26/56    TODAY'S TREATMENT: 06/30/23 Nustep 6 minutes for dynamic warm up and conditioning level 5 STS with overhead reach 3 x 10 Shoulder horizontal abduction RTB 2 x 10 Standing alternating march with contralateral shoulder flexion 2 x 10 bilateral Lateral stepping 4 x 15 feet at rail    06/28/23 Nustep 6 minutes for dynamic warm up and conditioning level 5 STS with overhead reach 3 x 10 Seated march RTB 2 x 15 Seated hip abduction RTB 2 x 15 LAQ 1 x 10 bilateral Shoulder bilateral ER RTB 2 x 10 Shoulder horizontal abduction RTB 2 x 10   06/23/23 STS with overhead reach 3 x 10 Forward step with overhead reach 2 x 10 bilateral Seated Row GTB 2 x 15 Lateral step with overhead reach 2 x 10 bilateral Bilateral shoulder press 3# 2 x 15 bilateral  Seated shoulder horizontal abduction RTB 2 x 10   06/21/23 Nustep 6 minutes for dynamic warm up and conditioning level 5 Seated long sitting hamstring stretch 3 x 30 second holds Seated hamstring isometric 10 x 5 second holds Bilateral shoulder press 3# 2 x 15 bilateral  Bilateral punch 3# 3 x 15 Bilateral curl 3 #2 x 15   10/18 Airex: Narrow base of support eyes open 3 x 20-second  hold Airex tandem stance 2 x 20-second hold  Heel/toe rock 2 x 15 Standing march 2 x 15  Lateral walk 5 feet with upper extremity assist and mod assist for balance  Hurdle step over 2 x 10  Bilateral press 2 x 15 Bilateral punch 3 x 15 Bilateral curl 2 x 15 All performed with 2 pound weight     06/13/23 Reassessment   06/06/23 BP 128/80 Nustep 5 minutes for dynamic warm up and conditioning level 5 Bilateral shoulder ER RTB 2 x 10  Seated shoulder horizontal abduction RTB 2 x 10 LAQ 2# 1 x 10 with 5 second holds Standing alternating march 2 x 10 bilateral Standing hip abduction 2 x 10 bilateral Squat with UE support 2 x 10 NBOS on foam 2 x 30 second holds   06/02/23 Nustep 5 minutes for dynamic warm up and conditioning level 5 Step taps 4 inch 2 x 10 STS with overhead reach 3 x 10  Row GTB 2 x 15 Shoulder extension GTB 2 x 15 Forward step with overhead reach 2 x 10 bilateral Bilateral shoulder press in seated 2# 3 x 10 Bicep curl 2# 2 x 15  10/3 Seated: Bilateral press 2 x 15 Bilateral punch 3 x 15 Bilateral curl 2 x 15 All performed with 2 pound weight with contact-guard assist for balance and sitting.  Patient sitting back unsupported.  LAQ red 2 x 15 Hip abduction red 2 x 15 Seated march 2 x 15  Airex: Narrow base of support 2 x 20 seconds hold Tandem stance 2 x 20-second hold  Heel raises 2 rock back for balance x 20 Standing slow march for balance 2 x 10 each leg   05/24/23 Nustep 5 minutes for dynamic warm up and conditioning level 5 Row GTB 2 x 10 Shoulder extension GTB 2 x 10 STS with overhead reach 2 x 10 4 inch step taps with cueing for timing 1 x 12 bilateral Shoulder press with overhead reach 1 and 2 lb weights 2 x 10   9/16 Seated: Bilateral press 2 x 15 Bilateral punch 3 x 15 Bilateral curl 2 x 15 All performed with 1-2 pound weight with contact-guard assist for balance and sitting.  Patient sitting back unsupported.  Sit to stand  5x 3 sets   Airex narrow base 3 x 30 seconds Airex narrow base eyes closed 2 x 20 seconds mod assist for balance  Single hurdle step over attempted 2 x 10.  Patient had significant difficulty today.  Mod assist for balance. Standing march with upper extremity support 2 x 15 moderate cueing for hip flexion Heel toe rocking 2 x 15.  Mod cueing to go forward and back.  Mod assist for balance.    9/12  BERG testing 3 min walk test  5x sit to stand  MMT   4 inch toe touching 2x15 4 inch step up 2x10 each leg  4 inch lateral step ups 2x10 each leg     9/10 Gait: long strides 50'x2  Marching 50'x2  Side steps 50'x4 with seated rest breaks  Increased assistance required today for balance mod to max assist at times.  Gait  Single hurdle step over 2 x 15  Seated: Bilateral press 2 x 15 Bilateral punch 3 x 15 Bilateral curl 2 x 15 All performed with 1 pound weight with contact-guard assist for balance and sitting.  Patient sitting back unsupported.  Airex: Rock/back x 20 min assist for balance Narrow base 3 x 20 seconds eyes open 3 x 20 seconds eyes closed       PATIENT EDUCATION:  Education details: exercise rationale and technique   Person educated: Patient Education method: Explanation, Demonstration, Tactile cues, Verbal cues, Education comprehension: verbalized understanding, returned demonstration, verbal cues required, tactile cues required, and needs further education   HOME EXERCISE PROGRAM: Access Code: [email or web address] URL: [email or web address] Date: 06/28/2023 - Seated March with Resistance  - 1 x daily - 7 x weekly - 3 sets - 15 reps - Seated Hip Abduction with Resistance  - 1 x daily - 7 x weekly - 3 sets - 15 reps - Seated Long Arc Quad  - 1 x daily - 7 x weekly - 3 sets - 15 reps - Shoulder External Rotation and Scapular Retraction with Resistance  - 1 x daily - 7 x weekly - 2 sets - 10 reps - Seated Shoulder Horizontal Abduction with  Resistance  - 1 x daily - 7  x weekly - 2 sets - 10 reps  ASSESSMENT:  CLINICAL IMPRESSION: Continued with functional strengthening and large amplitude movements which are tolerated well. Intermittent rest breaks for fatigue. Frequent cueing for sequencing with fair carry over. Performance/mechanics began to become more impaired about 3/4 way though session with increased balance deficit and coordination. Ended session early due to fatigue and performance. Patient will continue to benefit from physical therapy in order to improve function and reduce impairment.   OBJECTIVE IMPAIRMENTS Abnormal gait, decreased activity tolerance, decreased balance, decreased mobility, difficulty walking, decreased strength, impaired tone, and pain.   ACTIVITY LIMITATIONS carrying, lifting, bending, standing, squatting, stairs, transfers, bed mobility, dressing, and locomotion level  PARTICIPATION LIMITATIONS: meal prep, cleaning, laundry, driving, shopping, community activity, and yard work  PERSONAL FACTORS Fibromyalgia; headaches, LBBB; osteopenia  are also affecting patient's functional outcome.   REHAB POTENTIAL: Good  CLINICAL DECISION MAKING: Evolving/moderate complexity increasing pain in her quads   EVALUATION COMPLEXITY: Moderate   GOALS: Goals reviewed with patient? Yes  SHORT TERM GOALS: Target date: 04/18/2023  Patient will transfer sit to stand smoothly and independently  Baseline: Goal status: depends on the day 9/12  2.  Patient will increase gross strength by 5lbs  Baseline:  Goal status: MET  3.  Patient will improve BERG score by 10  Baseline:  Goal status: MET LONG TERM GOALS: Target date: 05/16/2023   Patient will go improve balance to CGA for all balance exercises to improve safety  Baseline:  Goal status: INITIAL  2.  Patient will report a 50% reduction in falls  Baseline:  Goal status: INITIAL  3.  Patient will have a full balance program  Baseline:  Goal status:  INITIAL   PLAN: PT FREQUENCY: 2x/week  PT DURATION: 8 weeks  PLANNED INTERVENTIONS: Therapeutic exercises, Therapeutic activity, Neuromuscular re-education, Balance training, Gait training, Patient/Family education, Self Care, Joint mobilization, Stair training, Aquatic Therapy, Cryotherapy, Moist heat, Taping, Manual therapy, and Re-evaluation  PLAN FOR NEXT SESSION: work on improving sit to stand, posterior, dynamic, and static balance. Work on general conditioning. Be aware of leg pain on any given day.     Reola Mosher Janet Humphreys, PT 06/30/2023, 11:35 AM  [facility] [address] [place], [place], [address] Phone: [phone]   Fax:  [phone]

## 2023-07-05 ENCOUNTER — Ambulatory Visit (HOSPITAL_BASED_OUTPATIENT_CLINIC_OR_DEPARTMENT_OTHER): Payer: Medicare Other | Admitting: Physical Therapy

## 2023-07-05 ENCOUNTER — Encounter (HOSPITAL_BASED_OUTPATIENT_CLINIC_OR_DEPARTMENT_OTHER): Payer: Self-pay | Admitting: Physical Therapy

## 2023-07-05 DIAGNOSIS — R2689 Other abnormalities of gait and mobility: Secondary | ICD-10-CM

## 2023-07-05 DIAGNOSIS — G20A1 Parkinson's disease without dyskinesia, without mention of fluctuations: Secondary | ICD-10-CM | POA: Diagnosis not present

## 2023-07-05 NOTE — Therapy (Signed)
OUTPATIENT PHYSICAL THERAPY LOWER EXTREMITY TREATMENT   Patient Name: Morgan [name] MRN: [id number] DOB:1950/10/18, 72 y.o., female Today's Date: 07/05/2023    PT End of Session - 07/05/23 1101     Visit Number 22    Number of Visits 40    Date for PT Re-Evaluation 08/08/23    Authorization Type Progress note done at visit 16 next at 26    PT Start Time 1100    PT Stop Time 1135    PT Time Calculation (min) 35 min    Activity Tolerance Patient tolerated treatment well    Behavior During Therapy WFL for tasks assessed/performed                  Past Medical History:  Diagnosis Date   Anxiety    Fibromyalgia    Headache    Hypercholesteremia    controlled on medication   Hyperlipidemia    Hypertension    no current meds   LBBB (left bundle branch block)    Melanoma (HCC)    MVP (mitral valve prolapse)    Osteopenia    Parkinson disease (HCC)    Pneumonia    SVD (spontaneous vaginal delivery)    x 2   Past Surgical History:  Procedure Laterality Date   BLEPHAROPLASTY     cold knife conization     COLONOSCOPY     FH colon CA   CYSTOSCOPY W/ URETERAL STENT PLACEMENT Left 10/18/2021   Procedure: CYSTOSCOPY WITH RETROGRADE PYELOGRAM, URETERAL STENT PLACEMENT;  Surgeon: Jerilee Field, MD;  Location: [facility];  Service: Urology;  Laterality: Left;   DILATATION & CURETTAGE/HYSTEROSCOPY WITH MYOSURE N/A 02/14/2018   Procedure: ATTEMPTED DILATATION & CURETTAGE/HYSTEROSCOPY WITH MYOSURE;  Surgeon: Gerald Leitz, MD;  Location: [facility];  Service: Gynecology;  Laterality: N/A;  polyp   melanoma cancer     removed from left leg   RECONSTRUCTION OF EYELID     TONSILLECTOMY     TUBAL LIGATION     URETEROSCOPY WITH HOLMIUM LASER LITHOTRIPSY Left 12/17/2021   Procedure: URETEROSCOPY/HOLMIUM LASER/STENT EXCHANGE;  Surgeon: Jerilee Field, MD;  Location: [facility];  Service: Urology;  Laterality: Left;   WISDOM TOOTH EXTRACTION     Patient Active Problem List    Diagnosis Date Noted   COVID-19 virus infection 05/16/2022   HTN (hypertension) 10/20/2021   Physical deconditioning 10/20/2021   Hydronephrosis with infection 10/18/2021   Bacteremia due to Escherichia coli 10/18/2021   Severe sepsis (HCC) 10/17/2021   UTI (urinary tract infection) 10/17/2021   Acute metabolic encephalopathy 10/17/2021   Parkinson disease (HCC) 10/17/2021   Cervical stenosis (uterine cervix) 02/14/2018   Postmenopausal bleeding 02/14/2018   LBBB (left bundle branch block) 12/25/2015   PVCs (premature ventricular contractions) 12/25/2015   Hyperlipidemia 12/25/2015   Fibromyalgia 01/16/2014   Cholelithiasis 04/25/2013   Paralysis agitans (HCC) 01/15/2013   Hereditary and idiopathic peripheral neuropathy 01/15/2013     [name] MD PCP:   Cindi Carbon PROVIDER: Antoine Primas MD   REFERRING DIAG: Parkinsons   THERAPY DIAG:  Other abnormalities of gait and mobility  Parkinson's disease without dyskinesia or fluctuating manifestations (HCC)  Rationale for Evaluation and Treatment Rehabilitation  ONSET DATE: Several years   SUBJECTIVE:   SUBJECTIVE STATEMENT: Patient states was warn out after last time. Did not do HEP since last session. Tired today as she was up late last night. Legs have been hurting some.   PERTINENT HISTORY: Fibromyalgia; headaches, LBBB; osteopenia   PAIN:  Are  you having pain? no: NPRS scale: 4/10 Location: Legs Pain description:    Aggravating factors: just comes and goes  Relieving factors: just comes and goes; sometimes ibuprofen works but she is limited as to how much she can take  PRECAUTIONS: Patient reports that because of her heart she will pass out at times.   WEIGHT BEARING RESTRICTIONS No  FALLS:  Has patient fallen in last 6 months? Yes.  6 falls but likley more. Last fall    LIVING ENVIRONMENT: Stairs to the second floor in her house but she dosen't do much  4 steps in the front  2 steps in the garage   OCCUPATION: retired   Presenter, broadcasting: reading; goes to an exercise class   PLOF: Independent with household mobility with device  PATIENT GOALS  To have less pain in her legs and to move better    OBJECTIVE:  (measurements taken at evaluation unless otherwise noted)   DIAGNOSTIC FINDINGS:   PATIENT SURVEYS:  FOTO    COGNITION:  Overall cognitive status: Within functional limits for tasks assessed     SENSATION: Denies paresthesias   POSTURE: No Significant postural limitations  PALPATION: No trigger points noted   LOWER EXTREMITY ROM:  Passive ROM Right eval Left eval  Hip flexion    Hip extension    Hip abduction    Hip adduction    Hip internal rotation    Hip external rotation    Knee flexion    Knee extension    Ankle dorsiflexion    Ankle plantarflexion    Ankle inversion    Ankle eversion     (Blank rows = not tested)  WNL  LOWER EXTREMITY MMT:  MMT Right eval Left eval Right  7/22 Left  7/2 Right  9/12 Left 9/12 Right 06/13/23 Left 06/13/23  Hip flexion 21.7 29.7 23.5 25.9 24.6 25.6 41.3 37.8  Hip extension          Hip abduction 27.6 18.5 19.1 8.7 21.2 14.4 29.9 29.5  Hip adduction          Hip internal rotation          Hip external rotation          Knee flexion          Knee extension 23.5 20.2 17.7 16.8 22.7 16.3 39.4 49.8  Ankle dorsiflexion          Ankle plantarflexion          Ankle inversion          Ankle eversion           (Blank rows = not tested)  GAIT: Decreased hip flexion; leans to the left; right hip frop. Patient required CGA while walking and min a when she turned for balance. No change from IE   Functional tests:  Sit to stand: cuing to catch balance. Uses momentum to stand.    5x sit to stand Balance: 13 sec   9/12 5x sit to stand 13 sec    Narrow base min a  Narrow base eyes closed min to mod a  Tandem stance Min a both ways.  Single leg stance 5 sec bilateral     06/13/23: 285 feet with walking  stick, CGA  for safety  BERG Balance Test          Date:      Sit to Stand 1 2 06/13/23: 2  Standing unsupported 3 3 3   Sitting with back unsupported but  feet supported 1 3 4   Stand to sit  1 2 3   Transfers  2 1 2   Standing unsupported with eyes closed 3 3 3   Standing unsupported feet together 3 3 3   From standing position, reach forward with outstretched arm 1 2 3   From standing position, pick up object from floor 1 2 3   From standing position, turn and look behind over each shoulder 1 2 2   Turn 360 2 1 2   Standing unsupported, alternately place foot on step 1 1 2   Standing unsupported, one foot in front 1 1 2   Standing on one leg 0 0 2  Total:  21/56  34/56  21/30                                                                                                         26/56    TODAY'S TREATMENT: 07/05/23 Nustep 6 minutes for dynamic warm up and conditioning level 5, seat 7 Seated row RTB 2 x 10 Bilateral shoulder press 3# 2 x 15 bilateral  Bilateral punch 3# 3 x 15 bilateral Bilateral curl 3 #2 x 15 bilateral  Standing alternating march 2 x 10   06/30/23 Nustep 6 minutes for dynamic warm up and conditioning level 5 STS with overhead reach 3 x 10 Shoulder horizontal abduction RTB 2 x 10 Standing alternating march with contralateral shoulder flexion 2 x 10 bilateral Lateral stepping 4 x 15 feet at rail    06/28/23 Nustep 6 minutes for dynamic warm up and conditioning level 5 STS with overhead reach 3 x 10 Seated march RTB 2 x 15 Seated hip abduction RTB 2 x 15 LAQ 1 x 10 bilateral Shoulder bilateral ER RTB 2 x 10 Shoulder horizontal abduction RTB 2 x 10   06/23/23 STS with overhead reach 3 x 10 Forward step with overhead reach 2 x 10 bilateral Seated Row GTB 2 x 15 Lateral step with overhead reach 2 x 10 bilateral Bilateral shoulder press 3# 2 x 15 bilateral  Seated shoulder horizontal abduction RTB 2 x 10   06/21/23 Nustep 6 minutes for dynamic warm up and  conditioning level 5 Seated long sitting hamstring stretch 3 x 30 second holds Seated hamstring isometric 10 x 5 second holds Bilateral shoulder press 3# 2 x 15 bilateral  Bilateral punch 3# 3 x 15 Bilateral curl 3 #2 x 15   10/18 Airex: Narrow base of support eyes open 3 x 20-second hold Airex tandem stance 2 x 20-second hold  Heel/toe rock 2 x 15 Standing march 2 x 15  Lateral walk 5 feet with upper extremity assist and mod assist for balance  Hurdle step over 2 x 10  Bilateral press 2 x 15 Bilateral punch 3 x 15 Bilateral curl 2 x 15 All performed with 2 pound weight     06/13/23 Reassessment   06/06/23 BP 128/80 Nustep 5 minutes for dynamic warm up and conditioning level 5 Bilateral shoulder ER RTB 2 x 10  Seated shoulder horizontal abduction RTB 2 x 10 LAQ 2# 1 x  10 with 5 second holds Standing alternating march 2 x 10 bilateral Standing hip abduction 2 x 10 bilateral Squat with UE support 2 x 10 NBOS on foam 2 x 30 second holds   06/02/23 Nustep 5 minutes for dynamic warm up and conditioning level 5 Step taps 4 inch 2 x 10 STS with overhead reach 3 x 10 Row GTB 2 x 15 Shoulder extension GTB 2 x 15 Forward step with overhead reach 2 x 10 bilateral Bilateral shoulder press in seated 2# 3 x 10 Bicep curl 2# 2 x 15  10/3 Seated: Bilateral press 2 x 15 Bilateral punch 3 x 15 Bilateral curl 2 x 15 All performed with 2 pound weight with contact-guard assist for balance and sitting.  Patient sitting back unsupported.  LAQ red 2 x 15 Hip abduction red 2 x 15 Seated march 2 x 15  Airex: Narrow base of support 2 x 20 seconds hold Tandem stance 2 x 20-second hold  Heel raises 2 rock back for balance x 20 Standing slow march for balance 2 x 10 each leg   05/24/23 Nustep 5 minutes for dynamic warm up and conditioning level 5 Row GTB 2 x 10 Shoulder extension GTB 2 x 10 STS with overhead reach 2 x 10 4 inch step taps with cueing for timing 1 x 12  bilateral Shoulder press with overhead reach 1 and 2 lb weights 2 x 10   9/16 Seated: Bilateral press 2 x 15 Bilateral punch 3 x 15 Bilateral curl 2 x 15 All performed with 1-2 pound weight with contact-guard assist for balance and sitting.  Patient sitting back unsupported.  Sit to stand 5x 3 sets   Airex narrow base 3 x 30 seconds Airex narrow base eyes closed 2 x 20 seconds mod assist for balance  Single hurdle step over attempted 2 x 10.  Patient had significant difficulty today.  Mod assist for balance. Standing march with upper extremity support 2 x 15 moderate cueing for hip flexion Heel toe rocking 2 x 15.  Mod cueing to go forward and back.  Mod assist for balance.    9/12  BERG testing 3 min walk test  5x sit to stand  MMT   4 inch toe touching 2x15 4 inch step up 2x10 each leg  4 inch lateral step ups 2x10 each leg     9/10 Gait: long strides 50'x2  Marching 50'x2  Side steps 50'x4 with seated rest breaks  Increased assistance required today for balance mod to max assist at times.  Gait  Single hurdle step over 2 x 15  Seated: Bilateral press 2 x 15 Bilateral punch 3 x 15 Bilateral curl 2 x 15 All performed with 1 pound weight with contact-guard assist for balance and sitting.  Patient sitting back unsupported.  Airex: Rock/back x 20 min assist for balance Narrow base 3 x 20 seconds eyes open 3 x 20 seconds eyes closed       PATIENT EDUCATION:  Education details: exercise rationale and technique   Person educated: Patient Education method: Explanation, Demonstration, Tactile cues, Verbal cues, Education comprehension: verbalized understanding, returned demonstration, verbal cues required, tactile cues required, and needs further education   HOME EXERCISE PROGRAM: Access Code: [email or web address] URL: [email or web address] Date: 06/28/2023 - Seated March with Resistance  - 1 x daily - 7 x weekly - 3 sets - 15 reps - Seated Hip  Abduction with Resistance  - 1 x daily - 7 x weekly - 3  sets - 15 reps - Seated Long Arc Quad  - 1 x daily - 7 x weekly - 3 sets - 15 reps - Shoulder External Rotation and Scapular Retraction with Resistance  - 1 x daily - 7 x weekly - 2 sets - 10 reps - Seated Shoulder Horizontal Abduction with Resistance  - 1 x daily - 7 x weekly - 2 sets - 10 reps  ASSESSMENT:  CLINICAL IMPRESSION: Patient with increased fatigue today overall but a lot in legs following nustep. Performed UE and postural strengthening with intermittent cueing for mechanics and positioning. Patient visibly fatigued throughout session compared to prior sessions and she had to take meds during session. Limited by fatigue again today. Patient will continue to benefit from physical therapy in order to improve function and reduce impairment.   OBJECTIVE IMPAIRMENTS Abnormal gait, decreased activity tolerance, decreased balance, decreased mobility, difficulty walking, decreased strength, impaired tone, and pain.   ACTIVITY LIMITATIONS carrying, lifting, bending, standing, squatting, stairs, transfers, bed mobility, dressing, and locomotion level  PARTICIPATION LIMITATIONS: meal prep, cleaning, laundry, driving, shopping, community activity, and yard work  PERSONAL FACTORS Fibromyalgia; headaches, LBBB; osteopenia  are also affecting patient's functional outcome.   REHAB POTENTIAL: Good  CLINICAL DECISION MAKING: Evolving/moderate complexity increasing pain in her quads   EVALUATION COMPLEXITY: Moderate   GOALS: Goals reviewed with patient? Yes  SHORT TERM GOALS: Target date: 04/18/2023  Patient will transfer sit to stand smoothly and independently  Baseline: Goal status: depends on the day 9/12  2.  Patient will increase gross strength by 5lbs  Baseline:  Goal status: MET  3.  Patient will improve BERG score by 10  Baseline:  Goal status: MET LONG TERM GOALS: Target date: 05/16/2023   Patient will go improve  balance to CGA for all balance exercises to improve safety  Baseline:  Goal status: INITIAL  2.  Patient will report a 50% reduction in falls  Baseline:  Goal status: INITIAL  3.  Patient will have a full balance program  Baseline:  Goal status: INITIAL   PLAN: PT FREQUENCY: 2x/week  PT DURATION: 8 weeks  PLANNED INTERVENTIONS: Therapeutic exercises, Therapeutic activity, Neuromuscular re-education, Balance training, Gait training, Patient/Family education, Self Care, Joint mobilization, Stair training, Aquatic Therapy, Cryotherapy, Moist heat, Taping, Manual therapy, and Re-evaluation  PLAN FOR NEXT SESSION: work on improving sit to stand, posterior, dynamic, and static balance. Work on general conditioning. Be aware of leg pain on any given day.     Reola Mosher Lorah Kalina, PT 07/05/2023, 11:35 AM  [facility] [address] [place], [place], [address] Phone: [phone]   Fax:  [phone]

## 2023-07-07 ENCOUNTER — Ambulatory Visit (HOSPITAL_BASED_OUTPATIENT_CLINIC_OR_DEPARTMENT_OTHER): Payer: Medicare Other | Admitting: Physical Therapy

## 2023-07-07 ENCOUNTER — Encounter (HOSPITAL_BASED_OUTPATIENT_CLINIC_OR_DEPARTMENT_OTHER): Payer: Self-pay | Admitting: Physical Therapy

## 2023-07-07 DIAGNOSIS — G20A1 Parkinson's disease without dyskinesia, without mention of fluctuations: Secondary | ICD-10-CM

## 2023-07-07 DIAGNOSIS — R2689 Other abnormalities of gait and mobility: Secondary | ICD-10-CM

## 2023-07-07 NOTE — Therapy (Signed)
OUTPATIENT PHYSICAL THERAPY LOWER EXTREMITY TREATMENT   Patient Name: Morgan [name] MRN: [id number] DOB:03/29/1951, 72 y.o., female Today's Date: 07/07/2023    PT End of Session - 07/07/23 1059     Visit Number 23    Number of Visits 40    Date for PT Re-Evaluation 08/08/23    Authorization Type Progress note done at visit 16 next at 26    PT Start Time 1100    PT Stop Time 1140    PT Time Calculation (min) 40 min    Activity Tolerance Patient tolerated treatment well    Behavior During Therapy WFL for tasks assessed/performed                  Past Medical History:  Diagnosis Date   Anxiety    Fibromyalgia    Headache    Hypercholesteremia    controlled on medication   Hyperlipidemia    Hypertension    no current meds   LBBB (left bundle branch block)    Melanoma (HCC)    MVP (mitral valve prolapse)    Osteopenia    Parkinson disease (HCC)    Pneumonia    SVD (spontaneous vaginal delivery)    x 2   Past Surgical History:  Procedure Laterality Date   BLEPHAROPLASTY     cold knife conization     COLONOSCOPY     FH colon CA   CYSTOSCOPY W/ URETERAL STENT PLACEMENT Left 10/18/2021   Procedure: CYSTOSCOPY WITH RETROGRADE PYELOGRAM, URETERAL STENT PLACEMENT;  Surgeon: Jerilee Field, MD;  Location: [facility];  Service: Urology;  Laterality: Left;   DILATATION & CURETTAGE/HYSTEROSCOPY WITH MYOSURE N/A 02/14/2018   Procedure: ATTEMPTED DILATATION & CURETTAGE/HYSTEROSCOPY WITH MYOSURE;  Surgeon: Gerald Leitz, MD;  Location: [facility];  Service: Gynecology;  Laterality: N/A;  polyp   melanoma cancer     removed from left leg   RECONSTRUCTION OF EYELID     TONSILLECTOMY     TUBAL LIGATION     URETEROSCOPY WITH HOLMIUM LASER LITHOTRIPSY Left 12/17/2021   Procedure: URETEROSCOPY/HOLMIUM LASER/STENT EXCHANGE;  Surgeon: Jerilee Field, MD;  Location: [facility];  Service: Urology;  Laterality: Left;   WISDOM TOOTH EXTRACTION     Patient Active Problem List    Diagnosis Date Noted   COVID-19 virus infection 05/16/2022   HTN (hypertension) 10/20/2021   Physical deconditioning 10/20/2021   Hydronephrosis with infection 10/18/2021   Bacteremia due to Escherichia coli 10/18/2021   Severe sepsis (HCC) 10/17/2021   UTI (urinary tract infection) 10/17/2021   Acute metabolic encephalopathy 10/17/2021   Parkinson disease (HCC) 10/17/2021   Cervical stenosis (uterine cervix) 02/14/2018   Postmenopausal bleeding 02/14/2018   LBBB (left bundle branch block) 12/25/2015   PVCs (premature ventricular contractions) 12/25/2015   Hyperlipidemia 12/25/2015   Fibromyalgia 01/16/2014   Cholelithiasis 04/25/2013   Paralysis agitans (HCC) 01/15/2013   Hereditary and idiopathic peripheral neuropathy 01/15/2013     [name] MD PCP:   Cindi Carbon PROVIDER: Antoine Primas MD   REFERRING DIAG: Parkinsons   THERAPY DIAG:  Other abnormalities of gait and mobility  Parkinson's disease without dyskinesia or fluctuating manifestations (HCC)  Rationale for Evaluation and Treatment Rehabilitation  ONSET DATE: Several years   SUBJECTIVE:   SUBJECTIVE STATEMENT: Patient states not doing too well today. Generally not feeling too great.   PERTINENT HISTORY: Fibromyalgia; headaches, LBBB; osteopenia   PAIN:  Are you having pain? no: NPRS scale: 4/10 Location: Legs Pain description:    Aggravating factors: just  comes and goes  Relieving factors: just comes and goes; sometimes ibuprofen works but she is limited as to how much she can take  PRECAUTIONS: Patient reports that because of her heart she will pass out at times.   WEIGHT BEARING RESTRICTIONS No  FALLS:  Has patient fallen in last 6 months? Yes.  6 falls but likley more. Last fall    LIVING ENVIRONMENT: Stairs to the second floor in her house but she dosen't do much  4 steps in the front  2 steps in the garage  OCCUPATION: retired   Presenter, broadcasting: reading; goes to an exercise class   PLOF:  Independent with household mobility with device  PATIENT GOALS  To have less pain in her legs and to move better    OBJECTIVE:  (measurements taken at evaluation unless otherwise noted)   DIAGNOSTIC FINDINGS:   PATIENT SURVEYS:  FOTO    COGNITION:  Overall cognitive status: Within functional limits for tasks assessed     SENSATION: Denies paresthesias   POSTURE: No Significant postural limitations  PALPATION: No trigger points noted   LOWER EXTREMITY ROM:  Passive ROM Right eval Left eval  Hip flexion    Hip extension    Hip abduction    Hip adduction    Hip internal rotation    Hip external rotation    Knee flexion    Knee extension    Ankle dorsiflexion    Ankle plantarflexion    Ankle inversion    Ankle eversion     (Blank rows = not tested)  WNL  LOWER EXTREMITY MMT:  MMT Right eval Left eval Right  7/22 Left  7/2 Right  9/12 Left 9/12 Right 06/13/23 Left 06/13/23  Hip flexion 21.7 29.7 23.5 25.9 24.6 25.6 41.3 37.8  Hip extension          Hip abduction 27.6 18.5 19.1 8.7 21.2 14.4 29.9 29.5  Hip adduction          Hip internal rotation          Hip external rotation          Knee flexion          Knee extension 23.5 20.2 17.7 16.8 22.7 16.3 39.4 49.8  Ankle dorsiflexion          Ankle plantarflexion          Ankle inversion          Ankle eversion           (Blank rows = not tested)  GAIT: Decreased hip flexion; leans to the left; right hip frop. Patient required CGA while walking and min a when she turned for balance. No change from IE   Functional tests:  Sit to stand: cuing to catch balance. Uses momentum to stand.    5x sit to stand Balance: 13 sec   9/12 5x sit to stand 13 sec    Narrow base min a  Narrow base eyes closed min to mod a  Tandem stance Min a both ways.  Single leg stance 5 sec bilateral     06/13/23: 285 feet with walking stick, CGA  for safety  BERG Balance Test          Date:      Sit to Stand 1  2 06/13/23: 2  Standing unsupported 3 3 3   Sitting with back unsupported but feet supported 1 3 4   Stand to sit  1 2 3   Transfers  2  1 2  Standing unsupported with eyes closed 3 3 3   Standing unsupported feet together 3 3 3   From standing position, reach forward with outstretched arm 1 2 3   From standing position, pick up object from floor 1 2 3   From standing position, turn and look behind over each shoulder 1 2 2   Turn 360 2 1 2   Standing unsupported, alternately place foot on step 1 1 2   Standing unsupported, one foot in front 1 1 2   Standing on one leg 0 0 2  Total:  21/56  34/56  21/30                                                                                                         26/56    TODAY'S TREATMENT: 07/07/23 Nustep 6 minutes for dynamic warm up and conditioning level 5, seat 7 LAQ 2 # 2 x 10  Bilateral shoulder press 3# 2 x 15 bilateral  Bilateral punch 3# 3 x 15 bilateral Standing alternating march 2 x 10 Bridge 2 x 10  Supine shoulder flexion 1# bar 2 x 10 Supine alternating march 2 x 10 bilateral Supine alternating march with opposite UE raise 2 x 10 bilateral   07/05/23 Nustep 6 minutes for dynamic warm up and conditioning level 5, seat 7 Seated row RTB 2 x 10 Bilateral shoulder press 3# 2 x 15 bilateral  Bilateral punch 3# 3 x 15 bilateral Bilateral curl 3 #2 x 15 bilateral  Standing alternating march 2 x 10   06/30/23 Nustep 6 minutes for dynamic warm up and conditioning level 5 STS with overhead reach 3 x 10 Shoulder horizontal abduction RTB 2 x 10 Standing alternating march with contralateral shoulder flexion 2 x 10 bilateral Lateral stepping 4 x 15 feet at rail    06/28/23 Nustep 6 minutes for dynamic warm up and conditioning level 5 STS with overhead reach 3 x 10 Seated march RTB 2 x 15 Seated hip abduction RTB 2 x 15 LAQ 1 x 10 bilateral Shoulder bilateral ER RTB 2 x 10 Shoulder horizontal abduction RTB 2 x 10   06/23/23 STS  with overhead reach 3 x 10 Forward step with overhead reach 2 x 10 bilateral Seated Row GTB 2 x 15 Lateral step with overhead reach 2 x 10 bilateral Bilateral shoulder press 3# 2 x 15 bilateral  Seated shoulder horizontal abduction RTB 2 x 10   06/21/23 Nustep 6 minutes for dynamic warm up and conditioning level 5 Seated long sitting hamstring stretch 3 x 30 second holds Seated hamstring isometric 10 x 5 second holds Bilateral shoulder press 3# 2 x 15 bilateral  Bilateral punch 3# 3 x 15 Bilateral curl 3 #2 x 15   10/18 Airex: Narrow base of support eyes open 3 x 20-second hold Airex tandem stance 2 x 20-second hold  Heel/toe rock 2 x 15 Standing march 2 x 15  Lateral walk 5 feet with upper extremity assist and mod assist for balance  Hurdle step over 2 x 10  Bilateral press 2 x  15 Bilateral punch 3 x 15 Bilateral curl 2 x 15 All performed with 2 pound weight     06/13/23 Reassessment   06/06/23 BP 128/80 Nustep 5 minutes for dynamic warm up and conditioning level 5 Bilateral shoulder ER RTB 2 x 10  Seated shoulder horizontal abduction RTB 2 x 10 LAQ 2# 1 x 10 with 5 second holds Standing alternating march 2 x 10 bilateral Standing hip abduction 2 x 10 bilateral Squat with UE support 2 x 10 NBOS on foam 2 x 30 second holds   06/02/23 Nustep 5 minutes for dynamic warm up and conditioning level 5 Step taps 4 inch 2 x 10 STS with overhead reach 3 x 10 Row GTB 2 x 15 Shoulder extension GTB 2 x 15 Forward step with overhead reach 2 x 10 bilateral Bilateral shoulder press in seated 2# 3 x 10 Bicep curl 2# 2 x 15  10/3 Seated: Bilateral press 2 x 15 Bilateral punch 3 x 15 Bilateral curl 2 x 15 All performed with 2 pound weight with contact-guard assist for balance and sitting.  Patient sitting back unsupported.  LAQ red 2 x 15 Hip abduction red 2 x 15 Seated march 2 x 15  Airex: Narrow base of support 2 x 20 seconds hold Tandem stance 2 x 20-second  hold  Heel raises 2 rock back for balance x 20 Standing slow march for balance 2 x 10 each leg       PATIENT EDUCATION:  Education details: exercise rationale and technique   Person educated: Patient Education method: Explanation, Demonstration, Tactile cues, Verbal cues, Education comprehension: verbalized understanding, returned demonstration, verbal cues required, tactile cues required, and needs further education   HOME EXERCISE PROGRAM: Access Code: [email or web address] URL: [email or web address] Date: 06/28/2023 - Seated March with Resistance  - 1 x daily - 7 x weekly - 3 sets - 15 reps - Seated Hip Abduction with Resistance  - 1 x daily - 7 x weekly - 3 sets - 15 reps - Seated Long Arc Quad  - 1 x daily - 7 x weekly - 3 sets - 15 reps - Shoulder External Rotation and Scapular Retraction with Resistance  - 1 x daily - 7 x weekly - 2 sets - 10 reps - Seated Shoulder Horizontal Abduction with Resistance  - 1 x daily - 7 x weekly - 2 sets - 10 reps  ASSESSMENT:  CLINICAL IMPRESSION: Patient with increased balance deficit  and impaired endurance/activity tolerance today. Patient did take meds before session but did not notice the effects during session. Fatigue limiting sitting/standing exercises so transitioned to table exercises. Fatigue at 8/10 at end of session. Patient will continue to benefit from physical therapy in order to improve function and reduce impairment.   OBJECTIVE IMPAIRMENTS Abnormal gait, decreased activity tolerance, decreased balance, decreased mobility, difficulty walking, decreased strength, impaired tone, and pain.   ACTIVITY LIMITATIONS carrying, lifting, bending, standing, squatting, stairs, transfers, bed mobility, dressing, and locomotion level  PARTICIPATION LIMITATIONS: meal prep, cleaning, laundry, driving, shopping, community activity, and yard work  PERSONAL FACTORS Fibromyalgia; headaches, LBBB; osteopenia  are also affecting patient's  functional outcome.   REHAB POTENTIAL: Good  CLINICAL DECISION MAKING: Evolving/moderate complexity increasing pain in her quads   EVALUATION COMPLEXITY: Moderate   GOALS: Goals reviewed with patient? Yes  SHORT TERM GOALS: Target date: 04/18/2023  Patient will transfer sit to stand smoothly and independently  Baseline: Goal status: depends on the day 9/12  2.  Patient will increase  gross strength by 5lbs  Baseline:  Goal status: MET  3.  Patient will improve BERG score by 10  Baseline:  Goal status: MET LONG TERM GOALS: Target date: 05/16/2023   Patient will go improve balance to CGA for all balance exercises to improve safety  Baseline:  Goal status: INITIAL  2.  Patient will report a 50% reduction in falls  Baseline:  Goal status: INITIAL  3.  Patient will have a full balance program  Baseline:  Goal status: INITIAL   PLAN: PT FREQUENCY: 2x/week  PT DURATION: 8 weeks  PLANNED INTERVENTIONS: Therapeutic exercises, Therapeutic activity, Neuromuscular re-education, Balance training, Gait training, Patient/Family education, Self Care, Joint mobilization, Stair training, Aquatic Therapy, Cryotherapy, Moist heat, Taping, Manual therapy, and Re-evaluation  PLAN FOR NEXT SESSION: work on improving sit to stand, posterior, dynamic, and static balance. Work on general conditioning. Be aware of leg pain on any given day.     Reola Mosher Dionne Rossa, PT 07/07/2023, 10:59 AM  [facility] [address] [place], [place], [address] Phone: [phone]   Fax:  [phone]

## 2023-07-12 ENCOUNTER — Encounter (HOSPITAL_BASED_OUTPATIENT_CLINIC_OR_DEPARTMENT_OTHER): Payer: Medicare Other | Admitting: Physical Therapy

## 2023-07-14 ENCOUNTER — Encounter (HOSPITAL_BASED_OUTPATIENT_CLINIC_OR_DEPARTMENT_OTHER): Payer: Medicare Other | Admitting: Physical Therapy

## 2023-07-19 ENCOUNTER — Ambulatory Visit (HOSPITAL_BASED_OUTPATIENT_CLINIC_OR_DEPARTMENT_OTHER): Payer: Medicare Other | Admitting: Physical Therapy

## 2023-07-19 ENCOUNTER — Encounter (HOSPITAL_BASED_OUTPATIENT_CLINIC_OR_DEPARTMENT_OTHER): Payer: Self-pay | Admitting: Physical Therapy

## 2023-07-19 DIAGNOSIS — G20A1 Parkinson's disease without dyskinesia, without mention of fluctuations: Secondary | ICD-10-CM | POA: Diagnosis not present

## 2023-07-19 DIAGNOSIS — R2689 Other abnormalities of gait and mobility: Secondary | ICD-10-CM | POA: Diagnosis not present

## 2023-07-19 NOTE — Therapy (Signed)
OUTPATIENT PHYSICAL THERAPY LOWER EXTREMITY TREATMENT   Patient Name: Morgan [name] MRN: [id number] DOB:1950-09-16, 72 y.o., female Today's Date: 07/19/2023    PT End of Session - 07/19/23 1029     Visit Number 24    Number of Visits 40    Date for PT Re-Evaluation 08/08/23    Progress Note Due on Visit 26    PT Start Time 1030    PT Stop Time 1110    PT Time Calculation (min) 40 min                  Past Medical History:  Diagnosis Date   Anxiety    Fibromyalgia    Headache    Hypercholesteremia    controlled on medication   Hyperlipidemia    Hypertension    no current meds   LBBB (left bundle branch block)    Melanoma (HCC)    MVP (mitral valve prolapse)    Osteopenia    Parkinson disease (HCC)    Pneumonia    SVD (spontaneous vaginal delivery)    x 2   Past Surgical History:  Procedure Laterality Date   BLEPHAROPLASTY     cold knife conization     COLONOSCOPY     FH colon CA   CYSTOSCOPY W/ URETERAL STENT PLACEMENT Left 10/18/2021   Procedure: CYSTOSCOPY WITH RETROGRADE PYELOGRAM, URETERAL STENT PLACEMENT;  Surgeon: Jerilee Field, MD;  Location: [facility];  Service: Urology;  Laterality: Left;   DILATATION & CURETTAGE/HYSTEROSCOPY WITH MYOSURE N/A 02/14/2018   Procedure: ATTEMPTED DILATATION & CURETTAGE/HYSTEROSCOPY WITH MYOSURE;  Surgeon: Gerald Leitz, MD;  Location: [facility];  Service: Gynecology;  Laterality: N/A;  polyp   melanoma cancer     removed from left leg   RECONSTRUCTION OF EYELID     TONSILLECTOMY     TUBAL LIGATION     URETEROSCOPY WITH HOLMIUM LASER LITHOTRIPSY Left 12/17/2021   Procedure: URETEROSCOPY/HOLMIUM LASER/STENT EXCHANGE;  Surgeon: Jerilee Field, MD;  Location: [facility];  Service: Urology;  Laterality: Left;   WISDOM TOOTH EXTRACTION     Patient Active Problem List   Diagnosis Date Noted   COVID-19 virus infection 05/16/2022   HTN (hypertension) 10/20/2021   Physical deconditioning 10/20/2021   Hydronephrosis with  infection 10/18/2021   Bacteremia due to Escherichia coli 10/18/2021   Severe sepsis (HCC) 10/17/2021   UTI (urinary tract infection) 10/17/2021   Acute metabolic encephalopathy 10/17/2021   Parkinson disease (HCC) 10/17/2021   Cervical stenosis (uterine cervix) 02/14/2018   Postmenopausal bleeding 02/14/2018   LBBB (left bundle branch block) 12/25/2015   PVCs (premature ventricular contractions) 12/25/2015   Hyperlipidemia 12/25/2015   Fibromyalgia 01/16/2014   Cholelithiasis 04/25/2013   Paralysis agitans (HCC) 01/15/2013   Hereditary and idiopathic peripheral neuropathy 01/15/2013     [name] MD PCP:   Cindi Carbon PROVIDER: Antoine Primas MD   REFERRING DIAG: Parkinsons   THERAPY DIAG:  Other abnormalities of gait and mobility  Parkinson's disease without dyskinesia or fluctuating manifestations (HCC)  Rationale for Evaluation and Treatment Rehabilitation  ONSET DATE: Several years   SUBJECTIVE:   SUBJECTIVE STATEMENT: Patient states she is doing well today.   Per caregiver, pt had fall this morning while reaching forward into drawer. Pt reports she caught herself, but then rolled onto her back.    PERTINENT HISTORY: Fibromyalgia; headaches, LBBB; osteopenia   PAIN:  Are you having pain? no: NPRS scale: 0/10 Location: Pain description:    Aggravating factors: just comes and goes  Relieving factors: just comes and goes; sometimes ibuprofen works but she is limited as to how much she can take  PRECAUTIONS: Patient reports that because of her heart she will pass out at times.   WEIGHT BEARING RESTRICTIONS No  FALLS:  Has patient fallen in last 6 months? Yes.  6 falls but likley more. Last fall    LIVING ENVIRONMENT: Stairs to the second floor in her house but she dosen't do much  4 steps in the front  2 steps in the garage  OCCUPATION: retired   Presenter, broadcasting: reading; goes to an exercise class   PLOF: Independent with household mobility with  device  PATIENT GOALS  To have less pain in her legs and to move better    OBJECTIVE:  (measurements taken at evaluation unless otherwise noted)   DIAGNOSTIC FINDINGS:   PATIENT SURVEYS:  FOTO    COGNITION:  Overall cognitive status: Within functional limits for tasks assessed     SENSATION: Denies paresthesias   POSTURE: No Significant postural limitations  PALPATION: No trigger points noted   LOWER EXTREMITY ROM:  Passive ROM Right eval Left eval  Hip flexion    Hip extension    Hip abduction    Hip adduction    Hip internal rotation    Hip external rotation    Knee flexion    Knee extension    Ankle dorsiflexion    Ankle plantarflexion    Ankle inversion    Ankle eversion     (Blank rows = not tested)  WNL  LOWER EXTREMITY MMT:  MMT Right eval Left eval Right  7/22 Left  7/2 Right  9/12 Left 9/12 Right 06/13/23 Left 06/13/23  Hip flexion 21.7 29.7 23.5 25.9 24.6 25.6 41.3 37.8  Hip extension          Hip abduction 27.6 18.5 19.1 8.7 21.2 14.4 29.9 29.5  Hip adduction          Hip internal rotation          Hip external rotation          Knee flexion          Knee extension 23.5 20.2 17.7 16.8 22.7 16.3 39.4 49.8  Ankle dorsiflexion          Ankle plantarflexion          Ankle inversion          Ankle eversion           (Blank rows = not tested)  GAIT: Decreased hip flexion; leans to the left; right hip frop. Patient required CGA while walking and min a when she turned for balance. No change from IE   Functional tests:  Sit to stand: cuing to catch balance. Uses momentum to stand.    5x sit to stand Balance: 13 sec   9/12 5x sit to stand 13 sec Narrow base min a  Narrow base eyes closed min to mod a  Tandem stance Min a both ways.  Single leg stance 5 sec bilateral     06/13/23: 285 feet with walking stick, CGA  for safety  BERG Balance Test          Date:      Sit to Stand 1 2 06/13/23: 2  Standing unsupported 3 3 3    Sitting with back unsupported but feet supported 1 3 4   Stand to sit  1 2 3   Transfers  2 1 2   Standing unsupported with eyes  closed 3 3 3   Standing unsupported feet together 3 3 3   From standing position, reach forward with outstretched arm 1 2 3   From standing position, pick up object from floor 1 2 3   From standing position, turn and look behind over each shoulder 1 2 2   Turn 360 2 1 2   Standing unsupported, alternately place foot on step 1 1 2   Standing unsupported, one foot in front 1 1 2   Standing on one leg 0 0 2  Total:  21/56 26/56 34/56       TODAY'S TREATMENT: 07/19/23 Nustep 6 minutes for dynamic warm up and conditioning level 5, seat 7 LAQ 2.5 # 2 x 10  Bilateral shoulder press 3# 2 x 15 bilateral (2nd set alternating UE) Bilateral bicep curl 3# x 15 Bilateral punch 3# 2 x 15 bilateral Forward hurdles x 3 leading with RLE x 6 (intermittent UE on rail) Lateral step over hurdle x 10 (challenge) Standing marching 2 x 10 Marching on airex pad with SBA/CGA x 15 NBOS on Airex pad x 20sec SBA Semi tandem stance x 30sec each   07/07/23 Nustep 6 minutes for dynamic warm up and conditioning level 5, seat 7 LAQ 2 # 2 x 10  Bilateral shoulder press 3# 2 x 15 bilateral  Bilateral punch 3# 3 x 15 bilateral Standing alternating march 2 x 10 Bridge 2 x 10  Supine shoulder flexion 1# bar 2 x 10 Supine alternating march 2 x 10 bilateral Supine alternating march with opposite UE raise 2 x 10 bilateral   07/05/23 Nustep 6 minutes for dynamic warm up and conditioning level 5, seat 7 Seated row RTB 2 x 10 Bilateral shoulder press 3# 2 x 15 bilateral  Bilateral punch 3# 3 x 15 bilateral Bilateral curl 3 #2 x 15 bilateral  Standing alternating march 2 x 10   06/30/23 Nustep 6 minutes for dynamic warm up and conditioning level 5 STS with overhead reach 3 x 10 Shoulder horizontal abduction RTB 2 x 10 Standing alternating march with contralateral shoulder flexion 2 x 10  bilateral Lateral stepping 4 x 15 feet at rail    06/28/23 Nustep 6 minutes for dynamic warm up and conditioning level 5 STS with overhead reach 3 x 10 Seated march RTB 2 x 15 Seated hip abduction RTB 2 x 15 LAQ 1 x 10 bilateral Shoulder bilateral ER RTB 2 x 10 Shoulder horizontal abduction RTB 2 x 10   06/23/23 STS with overhead reach 3 x 10 Forward step with overhead reach 2 x 10 bilateral Seated Row GTB 2 x 15 Lateral step with overhead reach 2 x 10 bilateral Bilateral shoulder press 3# 2 x 15 bilateral  Seated shoulder horizontal abduction RTB 2 x 10   06/21/23 Nustep 6 minutes for dynamic warm up and conditioning level 5 Seated long sitting hamstring stretch 3 x 30 second holds Seated hamstring isometric 10 x 5 second holds Bilateral shoulder press 3# 2 x 15 bilateral  Bilateral punch 3# 3 x 15 Bilateral curl 3 #2 x 15   10/18 Airex: Narrow base of support eyes open 3 x 20-second hold Airex tandem stance 2 x 20-second hold  Heel/toe rock 2 x 15 Standing march 2 x 15  Lateral walk 5 feet with upper extremity assist and mod assist for balance  Hurdle step over 2 x 10  Bilateral press 2 x 15 Bilateral punch 3 x 15 Bilateral curl 2 x 15 All performed with 2 pound weight  06/13/23 Reassessment   06/06/23 BP 128/80 Nustep 5 minutes for dynamic warm up and conditioning level 5 Bilateral shoulder ER RTB 2 x 10  Seated shoulder horizontal abduction RTB 2 x 10 LAQ 2# 1 x 10 with 5 second holds Standing alternating march 2 x 10 bilateral Standing hip abduction 2 x 10 bilateral Squat with UE support 2 x 10 NBOS on foam 2 x 30 second holds   06/02/23 Nustep 5 minutes for dynamic warm up and conditioning level 5 Step taps 4 inch 2 x 10 STS with overhead reach 3 x 10 Row GTB 2 x 15 Shoulder extension GTB 2 x 15 Forward step with overhead reach 2 x 10 bilateral Bilateral shoulder press in seated 2# 3 x 10 Bicep curl 2# 2 x  15  10/3 Seated: Bilateral press 2 x 15 Bilateral punch 3 x 15 Bilateral curl 2 x 15 All performed with 2 pound weight with contact-guard assist for balance and sitting.  Patient sitting back unsupported.  LAQ red 2 x 15 Hip abduction red 2 x 15 Seated march 2 x 15  Airex: Narrow base of support 2 x 20 seconds hold Tandem stance 2 x 20-second hold  Heel raises 2 rock back for balance x 20 Standing slow march for balance 2 x 10 each leg       PATIENT EDUCATION:  Education details: exercise rationale and technique   Person educated: Patient Education method: Explanation, Demonstration, Tactile cues, Verbal cues, Education comprehension: verbalized understanding, returned demonstration, verbal cues required, tactile cues required, and needs further education   HOME EXERCISE PROGRAM: Access Code: [email or web address] URL: [email or web address] Date: 06/28/2023 - Seated March with Resistance  - 1 x daily - 7 x weekly - 3 sets - 15 reps - Seated Hip Abduction with Resistance  - 1 x daily - 7 x weekly - 3 sets - 15 reps - Seated Long Arc Quad  - 1 x daily - 7 x weekly - 3 sets - 15 reps - Shoulder External Rotation and Scapular Retraction with Resistance  - 1 x daily - 7 x weekly - 2 sets - 10 reps - Seated Shoulder Horizontal Abduction with Resistance  - 1 x daily - 7 x weekly - 2 sets - 10 reps  ASSESSMENT:  CLINICAL IMPRESSION: Pt tolerated seated exercises well.  Fatigued quickly with standing exercises, requiring brief seated rest breaks in between exercises. Pt had mild loss of balance when walking to clinic from gym after using NuStep, requiring CGA to correct.  Therapist to continue progressing balance exercises for LTG3. Patient will continue to benefit from physical therapy in order to improve function and reduce impairment.   OBJECTIVE IMPAIRMENTS Abnormal gait, decreased activity tolerance, decreased balance, decreased mobility, difficulty walking, decreased  strength, impaired tone, and pain.   ACTIVITY LIMITATIONS carrying, lifting, bending, standing, squatting, stairs, transfers, bed mobility, dressing, and locomotion level  PARTICIPATION LIMITATIONS: meal prep, cleaning, laundry, driving, shopping, community activity, and yard work  PERSONAL FACTORS Fibromyalgia; headaches, LBBB; osteopenia  are also affecting patient's functional outcome.   REHAB POTENTIAL: Good  CLINICAL DECISION MAKING: Evolving/moderate complexity increasing pain in her quads   EVALUATION COMPLEXITY: Moderate   GOALS: Goals reviewed with patient? Yes  SHORT TERM GOALS: Target date: 04/18/2023  Patient will transfer sit to stand smoothly and independently  Baseline: Goal status: depends on the day 9/12  2.  Patient will increase gross strength by 5lbs  Baseline:  Goal status: MET  3.  Patient will  improve BERG score by 10  Baseline:  Goal status: MET LONG TERM GOALS: Target date:POC  Patient will go improve balance to CGA for all balance exercises to improve safety  Baseline:  Goal status: INITIAL  2.  Patient will report a 50% reduction in falls  Baseline:  Goal status: INITIAL  3.  Patient will have a full balance program  Baseline:  Goal status: INITIAL   PLAN: PT FREQUENCY: 2x/week  PT DURATION: 8 weeks  PLANNED INTERVENTIONS: Therapeutic exercises, Therapeutic activity, Neuromuscular re-education, Balance training, Gait training, Patient/Family education, Self Care, Joint mobilization, Stair training, Aquatic Therapy, Cryotherapy, Moist heat, Taping, Manual therapy, and Re-evaluation  PLAN FOR NEXT SESSION: work on improving sit to stand, posterior, dynamic, and static balance. Work on general conditioning. Be aware of leg pain on any given day.   Mayer Camel, PTA 07/19/23 12:33 PM [facility] [address] [place], [place], [address] Phone: [phone]   Fax:   [phone]

## 2023-07-21 ENCOUNTER — Ambulatory Visit (HOSPITAL_BASED_OUTPATIENT_CLINIC_OR_DEPARTMENT_OTHER): Payer: Medicare Other | Admitting: Physical Therapy

## 2023-07-29 ENCOUNTER — Ambulatory Visit (HOSPITAL_BASED_OUTPATIENT_CLINIC_OR_DEPARTMENT_OTHER): Payer: Medicare Other | Admitting: Physical Therapy

## 2023-07-29 ENCOUNTER — Encounter (HOSPITAL_BASED_OUTPATIENT_CLINIC_OR_DEPARTMENT_OTHER): Payer: Self-pay | Admitting: Physical Therapy

## 2023-07-29 DIAGNOSIS — G20A1 Parkinson's disease without dyskinesia, without mention of fluctuations: Secondary | ICD-10-CM | POA: Diagnosis not present

## 2023-07-29 DIAGNOSIS — R2689 Other abnormalities of gait and mobility: Secondary | ICD-10-CM

## 2023-07-29 NOTE — Therapy (Signed)
OUTPATIENT PHYSICAL THERAPY LOWER EXTREMITY TREATMENT   Patient Name: Morgan [name] MRN: [id number] DOB:Dec 03, 1950, 72 y.o., female Today's Date: 07/29/2023    PT End of Session - 07/29/23 0831     Visit Number 25    Number of Visits 40    Date for PT Re-Evaluation 08/08/23    Progress Note Due on Visit 26    PT Start Time 0831    PT Stop Time 0910    PT Time Calculation (min) 39 min    Activity Tolerance Patient tolerated treatment well    Behavior During Therapy WFL for tasks assessed/performed                  Past Medical History:  Diagnosis Date   Anxiety    Fibromyalgia    Headache    Hypercholesteremia    controlled on medication   Hyperlipidemia    Hypertension    no current meds   LBBB (left bundle branch block)    Melanoma (HCC)    MVP (mitral valve prolapse)    Osteopenia    Parkinson disease (HCC)    Pneumonia    SVD (spontaneous vaginal delivery)    x 2   Past Surgical History:  Procedure Laterality Date   BLEPHAROPLASTY     cold knife conization     COLONOSCOPY     FH colon CA   CYSTOSCOPY W/ URETERAL STENT PLACEMENT Left 10/18/2021   Procedure: CYSTOSCOPY WITH RETROGRADE PYELOGRAM, URETERAL STENT PLACEMENT;  Surgeon: Jerilee Field, MD;  Location: [facility];  Service: Urology;  Laterality: Left;   DILATATION & CURETTAGE/HYSTEROSCOPY WITH MYOSURE N/A 02/14/2018   Procedure: ATTEMPTED DILATATION & CURETTAGE/HYSTEROSCOPY WITH MYOSURE;  Surgeon: Gerald Leitz, MD;  Location: [facility];  Service: Gynecology;  Laterality: N/A;  polyp   melanoma cancer     removed from left leg   RECONSTRUCTION OF EYELID     TONSILLECTOMY     TUBAL LIGATION     URETEROSCOPY WITH HOLMIUM LASER LITHOTRIPSY Left 12/17/2021   Procedure: URETEROSCOPY/HOLMIUM LASER/STENT EXCHANGE;  Surgeon: Jerilee Field, MD;  Location: [facility];  Service: Urology;  Laterality: Left;   WISDOM TOOTH EXTRACTION     Patient Active Problem List   Diagnosis Date Noted   COVID-19  virus infection 05/16/2022   HTN (hypertension) 10/20/2021   Physical deconditioning 10/20/2021   Hydronephrosis with infection 10/18/2021   Bacteremia due to Escherichia coli 10/18/2021   Severe sepsis (HCC) 10/17/2021   UTI (urinary tract infection) 10/17/2021   Acute metabolic encephalopathy 10/17/2021   Parkinson disease (HCC) 10/17/2021   Cervical stenosis (uterine cervix) 02/14/2018   Postmenopausal bleeding 02/14/2018   LBBB (left bundle branch block) 12/25/2015   PVCs (premature ventricular contractions) 12/25/2015   Hyperlipidemia 12/25/2015   Fibromyalgia 01/16/2014   Cholelithiasis 04/25/2013   Paralysis agitans (HCC) 01/15/2013   Hereditary and idiopathic peripheral neuropathy 01/15/2013     [name] MD PCP:   Cindi Carbon PROVIDER: Antoine Primas MD   REFERRING DIAG: Parkinsons   THERAPY DIAG:  Other abnormalities of gait and mobility  Parkinson's disease without dyskinesia or fluctuating manifestations (HCC)  Rationale for Evaluation and Treatment Rehabilitation  ONSET DATE: Several years   SUBJECTIVE:   SUBJECTIVE STATEMENT: Patient states she has been so so. Yesterday had some memory issues. Husband states lacking strength and coordination.  PERTINENT HISTORY: Fibromyalgia; headaches, LBBB; osteopenia   PAIN:  Are you having pain? no: NPRS scale: 0/10 Location: Pain description:    Aggravating factors: just comes  and goes  Relieving factors: just comes and goes; sometimes ibuprofen works but she is limited as to how much she can take  PRECAUTIONS: Patient reports that because of her heart she will pass out at times.   WEIGHT BEARING RESTRICTIONS No  FALLS:  Has patient fallen in last 6 months? Yes.  6 falls but likley more. Last fall    LIVING ENVIRONMENT: Stairs to the second floor in her house but she dosen't do much  4 steps in the front  2 steps in the garage  OCCUPATION: retired   Presenter, broadcasting: reading; goes to an exercise class    PLOF: Independent with household mobility with device  PATIENT GOALS  To have less pain in her legs and to move better    OBJECTIVE:  (measurements taken at evaluation unless otherwise noted)   DIAGNOSTIC FINDINGS:   PATIENT SURVEYS:  FOTO    COGNITION:  Overall cognitive status: Within functional limits for tasks assessed     SENSATION: Denies paresthesias   POSTURE: No Significant postural limitations  PALPATION: No trigger points noted   LOWER EXTREMITY ROM:  Passive ROM Right eval Left eval  Hip flexion    Hip extension    Hip abduction    Hip adduction    Hip internal rotation    Hip external rotation    Knee flexion    Knee extension    Ankle dorsiflexion    Ankle plantarflexion    Ankle inversion    Ankle eversion     (Blank rows = not tested)  WNL  LOWER EXTREMITY MMT:  MMT Right eval Left eval Right  7/22 Left  7/2 Right  9/12 Left 9/12 Right 06/13/23 Left 06/13/23  Hip flexion 21.7 29.7 23.5 25.9 24.6 25.6 41.3 37.8  Hip extension          Hip abduction 27.6 18.5 19.1 8.7 21.2 14.4 29.9 29.5  Hip adduction          Hip internal rotation          Hip external rotation          Knee flexion          Knee extension 23.5 20.2 17.7 16.8 22.7 16.3 39.4 49.8  Ankle dorsiflexion          Ankle plantarflexion          Ankle inversion          Ankle eversion           (Blank rows = not tested)  GAIT: Decreased hip flexion; leans to the left; right hip frop. Patient required CGA while walking and min a when she turned for balance. No change from IE   Functional tests:  Sit to stand: cuing to catch balance. Uses momentum to stand.    5x sit to stand Balance: 13 sec   9/12 5x sit to stand 13 sec Narrow base min a  Narrow base eyes closed min to mod a  Tandem stance Min a both ways.  Single leg stance 5 sec bilateral     06/13/23: 285 feet with walking stick, CGA  for safety  BERG Balance Test          Date:      Sit to  Stand 1 2 06/13/23: 2  Standing unsupported 3 3 3   Sitting with back unsupported but feet supported 1 3 4   Stand to sit  1 2 3   Transfers  2 1 2   Standing  unsupported with eyes closed 3 3 3   Standing unsupported feet together 3 3 3   From standing position, reach forward with outstretched arm 1 2 3   From standing position, pick up object from floor 1 2 3   From standing position, turn and look behind over each shoulder 1 2 2   Turn 360 2 1 2   Standing unsupported, alternately place foot on step 1 1 2   Standing unsupported, one foot in front 1 1 2   Standing on one leg 0 0 2  Total:  21/56 26/56 34/56       TODAY'S TREATMENT: 07/29/23 Nustep 6 minutes for dynamic warm up and conditioning level 5, seat 7 STS with overhead reach 3 x 10 Alternating step taps 4 inch with contralateral shoulder flexion 2 x 10 bilateral  Shoulder horizontal abduction RTB 2 x 10 Bilateral shoulder ER RTB 2 x 10 Shoulder PNF D2 RTB 2 x 10  Seated small tricep dips 2 x 10  Semi tandem stance x 30sec each  07/19/23 Nustep 6 minutes for dynamic warm up and conditioning level 5, seat 7 LAQ 2.5 # 2 x 10  Bilateral shoulder press 3# 2 x 15 bilateral (2nd set alternating UE) Bilateral bicep curl 3# x 15 Bilateral punch 3# 2 x 15 bilateral Forward hurdles x 3 leading with RLE x 6 (intermittent UE on rail) Lateral step over hurdle x 10 (challenge) Standing marching 2 x 10 Marching on airex pad with SBA/CGA x 15 NBOS on Airex pad x 20sec SBA Semi tandem stance x 30sec each   07/07/23 Nustep 6 minutes for dynamic warm up and conditioning level 5, seat 7 LAQ 2 # 2 x 10  Bilateral shoulder press 3# 2 x 15 bilateral  Bilateral punch 3# 3 x 15 bilateral Standing alternating march 2 x 10 Bridge 2 x 10  Supine shoulder flexion 1# bar 2 x 10 Supine alternating march 2 x 10 bilateral Supine alternating march with opposite UE raise 2 x 10 bilateral   07/05/23 Nustep 6 minutes for dynamic warm up and  conditioning level 5, seat 7 Seated row RTB 2 x 10 Bilateral shoulder press 3# 2 x 15 bilateral  Bilateral punch 3# 3 x 15 bilateral Bilateral curl 3 #2 x 15 bilateral  Standing alternating march 2 x 10   06/30/23 Nustep 6 minutes for dynamic warm up and conditioning level 5 STS with overhead reach 3 x 10 Shoulder horizontal abduction RTB 2 x 10 Standing alternating march with contralateral shoulder flexion 2 x 10 bilateral Lateral stepping 4 x 15 feet at rail    06/28/23 Nustep 6 minutes for dynamic warm up and conditioning level 5 STS with overhead reach 3 x 10 Seated march RTB 2 x 15 Seated hip abduction RTB 2 x 15 LAQ 1 x 10 bilateral Shoulder bilateral ER RTB 2 x 10 Shoulder horizontal abduction RTB 2 x 10   06/23/23 STS with overhead reach 3 x 10 Forward step with overhead reach 2 x 10 bilateral Seated Row GTB 2 x 15 Lateral step with overhead reach 2 x 10 bilateral Bilateral shoulder press 3# 2 x 15 bilateral  Seated shoulder horizontal abduction RTB 2 x 10   06/21/23 Nustep 6 minutes for dynamic warm up and conditioning level 5 Seated long sitting hamstring stretch 3 x 30 second holds Seated hamstring isometric 10 x 5 second holds Bilateral shoulder press 3# 2 x 15 bilateral  Bilateral punch 3# 3 x 15 Bilateral curl 3 #2 x 15  10/18 Airex: Narrow base of support eyes open 3 x 20-second hold Airex tandem stance 2 x 20-second hold  Heel/toe rock 2 x 15 Standing march 2 x 15  Lateral walk 5 feet with upper extremity assist and mod assist for balance  Hurdle step over 2 x 10  Bilateral press 2 x 15 Bilateral punch 3 x 15 Bilateral curl 2 x 15 All performed with 2 pound weight     06/13/23 Reassessment   06/06/23 BP 128/80 Nustep 5 minutes for dynamic warm up and conditioning level 5 Bilateral shoulder ER RTB 2 x 10  Seated shoulder horizontal abduction RTB 2 x 10 LAQ 2# 1 x 10 with 5 second holds Standing alternating march 2 x 10  bilateral Standing hip abduction 2 x 10 bilateral Squat with UE support 2 x 10 NBOS on foam 2 x 30 second holds  PATIENT EDUCATION:  Education details: exercise rationale and technique   Person educated: Patient Education method: Explanation, Demonstration, Tactile cues, Verbal cues, Education comprehension: verbalized understanding, returned demonstration, verbal cues required, tactile cues required, and needs further education   HOME EXERCISE PROGRAM: Access Code: [email or web address] URL: [email or web address] Date: 06/28/2023 - Seated March with Resistance  - 1 x daily - 7 x weekly - 3 sets - 15 reps - Seated Hip Abduction with Resistance  - 1 x daily - 7 x weekly - 3 sets - 15 reps - Seated Long Arc Quad  - 1 x daily - 7 x weekly - 3 sets - 15 reps - Shoulder External Rotation and Scapular Retraction with Resistance  - 1 x daily - 7 x weekly - 2 sets - 10 reps - Seated Shoulder Horizontal Abduction with Resistance  - 1 x daily - 7 x weekly - 2 sets - 10 reps  ASSESSMENT:  CLINICAL IMPRESSION: Patient ambulating with increased gait shuffling and unsteadiness today. Continued with functional strength, coordination, and UE strength. Cueing for coordination and sequencing throughout with fair carry over. Manual cueing required for PNF exercise due to improper mechanics with poor carry over. Patient will continue to benefit from physical therapy in order to improve function and reduce impairment.   OBJECTIVE IMPAIRMENTS Abnormal gait, decreased activity tolerance, decreased balance, decreased mobility, difficulty walking, decreased strength, impaired tone, and pain.   ACTIVITY LIMITATIONS carrying, lifting, bending, standing, squatting, stairs, transfers, bed mobility, dressing, and locomotion level  PARTICIPATION LIMITATIONS: meal prep, cleaning, laundry, driving, shopping, community activity, and yard work  PERSONAL FACTORS Fibromyalgia; headaches, LBBB; osteopenia  are also  affecting patient's functional outcome.   REHAB POTENTIAL: Good  CLINICAL DECISION MAKING: Evolving/moderate complexity increasing pain in her quads   EVALUATION COMPLEXITY: Moderate   GOALS: Goals reviewed with patient? Yes  SHORT TERM GOALS: Target date: 04/18/2023  Patient will transfer sit to stand smoothly and independently  Baseline: Goal status: depends on the day 9/12  2.  Patient will increase gross strength by 5lbs  Baseline:  Goal status: MET  3.  Patient will improve BERG score by 10  Baseline:  Goal status: MET LONG TERM GOALS: Target date:POC  Patient will go improve balance to CGA for all balance exercises to improve safety  Baseline:  Goal status: INITIAL  2.  Patient will report a 50% reduction in falls  Baseline:  Goal status: INITIAL  3.  Patient will have a full balance program  Baseline:  Goal status: INITIAL   PLAN: PT FREQUENCY: 2x/week  PT DURATION: 8 weeks  PLANNED INTERVENTIONS: Therapeutic exercises, Therapeutic  activity, Neuromuscular re-education, Balance training, Gait training, Patient/Family education, Self Care, Joint mobilization, Stair training, Aquatic Therapy, Cryotherapy, Moist heat, Taping, Manual therapy, and Re-evaluation  PLAN FOR NEXT SESSION: work on improving sit to stand, posterior, dynamic, and static balance. Work on general conditioning. Be aware of leg pain on any given day.    Reola Mosher Lasharn Bufkin, PT 07/29/2023, 8:33 AM

## 2023-07-31 DIAGNOSIS — M8588 Other specified disorders of bone density and structure, other site: Secondary | ICD-10-CM | POA: Diagnosis not present

## 2023-07-31 DIAGNOSIS — E2839 Other primary ovarian failure: Secondary | ICD-10-CM | POA: Diagnosis not present

## 2023-08-02 ENCOUNTER — Encounter (HOSPITAL_BASED_OUTPATIENT_CLINIC_OR_DEPARTMENT_OTHER): Payer: Self-pay | Admitting: Physical Therapy

## 2023-08-02 ENCOUNTER — Ambulatory Visit (HOSPITAL_BASED_OUTPATIENT_CLINIC_OR_DEPARTMENT_OTHER): Payer: Medicare Other | Attending: Family Medicine | Admitting: Physical Therapy

## 2023-08-02 DIAGNOSIS — G20A1 Parkinson's disease without dyskinesia, without mention of fluctuations: Secondary | ICD-10-CM | POA: Insufficient documentation

## 2023-08-02 DIAGNOSIS — R2689 Other abnormalities of gait and mobility: Secondary | ICD-10-CM | POA: Insufficient documentation

## 2023-08-02 NOTE — Therapy (Signed)
OUTPATIENT PHYSICAL THERAPY LOWER EXTREMITY TREATMENT   Patient Name: CHETANA SCOPEL MRN: [id number] DOB:[birth year], 72 y.o., female Today's Date: 08/02/2023  Progress Note   Reporting Period 06/13/23 to 08/02/23   See note below for Objective Data and Assessment of Progress/Goals     PT End of Session - 08/02/23 1102     Visit Number 26    Number of Visits 56    Date for PT Re-Evaluation 09/27/23    Progress Note Due on Visit 36    PT Start Time 1102    PT Stop Time 1140    PT Time Calculation (min) 38 min    Equipment Utilized During Treatment Gait belt    Activity Tolerance Patient tolerated treatment well    Behavior During Therapy WFL for tasks assessed/performed                  Past Medical History:  Diagnosis Date   Anxiety    Fibromyalgia    Headache    Hypercholesteremia    controlled on medication   Hyperlipidemia    Hypertension    no current meds   LBBB (left bundle branch block)    Melanoma (HCC)    MVP (mitral valve prolapse)    Osteopenia    Parkinson disease (HCC)    Pneumonia    SVD (spontaneous vaginal delivery)    x 2   Past Surgical History:  Procedure Laterality Date   BLEPHAROPLASTY     cold knife conization     COLONOSCOPY     FH colon CA   CYSTOSCOPY W/ URETERAL STENT PLACEMENT Left 10/18/2021   Procedure: CYSTOSCOPY WITH RETROGRADE PYELOGRAM, URETERAL STENT PLACEMENT;  Surgeon: Jerilee Field, MD;  Location: [facility];  Service: Urology;  Laterality: Left;   DILATATION & CURETTAGE/HYSTEROSCOPY WITH MYOSURE N/A 02/14/2018   Procedure: ATTEMPTED DILATATION & CURETTAGE/HYSTEROSCOPY WITH MYOSURE;  Surgeon: Gerald Leitz, MD;  Location: [facility];  Service: Gynecology;  Laterality: N/A;  polyp   melanoma cancer     removed from left leg   RECONSTRUCTION OF EYELID     TONSILLECTOMY     TUBAL LIGATION     URETEROSCOPY WITH HOLMIUM LASER LITHOTRIPSY Left 12/17/2021   Procedure: URETEROSCOPY/HOLMIUM LASER/STENT EXCHANGE;  Surgeon:  Jerilee Field, MD;  Location: [facility];  Service: Urology;  Laterality: Left;   WISDOM TOOTH EXTRACTION     Patient Active Problem List   Diagnosis Date Noted   COVID-19 virus infection 05/16/2022   HTN (hypertension) 10/20/2021   Physical deconditioning 10/20/2021   Hydronephrosis with infection 10/18/2021   Bacteremia due to Escherichia coli 10/18/2021   Severe sepsis (HCC) 10/17/2021   UTI (urinary tract infection) 10/17/2021   Acute metabolic encephalopathy 10/17/2021   Parkinson disease (HCC) 10/17/2021   Cervical stenosis (uterine cervix) 02/14/2018   Postmenopausal bleeding 02/14/2018   LBBB (left bundle branch block) 12/25/2015   PVCs (premature ventricular contractions) 12/25/2015   Hyperlipidemia 12/25/2015   Fibromyalgia 01/16/2014   Cholelithiasis 04/25/2013   Paralysis agitans (HCC) 01/15/2013   Hereditary and idiopathic peripheral neuropathy 01/15/2013     [name] MD PCP:   Cindi Carbon PROVIDER: Antoine Primas MD   REFERRING DIAG: Parkinsons   THERAPY DIAG:  Other abnormalities of gait and mobility  Parkinson's disease without dyskinesia or fluctuating manifestations (HCC)  Rationale for Evaluation and Treatment Rehabilitation  ONSET DATE: Several years   SUBJECTIVE:   SUBJECTIVE STATEMENT: Patient states things have not been too bad. Has not fallen recently.   PERTINENT  HISTORY: Fibromyalgia; headaches, LBBB; osteopenia   PAIN:  Are you having pain? no: NPRS scale: 0/10 Location: Pain description:    Aggravating factors: just comes and goes  Relieving factors: just comes and goes; sometimes ibuprofen works but she is limited as to how much she can take  PRECAUTIONS: Patient reports that because of her heart she will pass out at times.   WEIGHT BEARING RESTRICTIONS No  FALLS:  Has patient fallen in last 6 months? Yes.  6 falls but likley more. Last fall    LIVING ENVIRONMENT: Stairs to the second floor in her house but she dosen't  do much  4 steps in the front  2 steps in the garage  OCCUPATION: retired   Presenter, broadcasting: reading; goes to an exercise class   PLOF: Independent with household mobility with device  PATIENT GOALS  To have less pain in her legs and to move better    OBJECTIVE:  (measurements taken at evaluation unless otherwise noted)   DIAGNOSTIC FINDINGS:   PATIENT SURVEYS:  FOTO    COGNITION:  Overall cognitive status: Within functional limits for tasks assessed     SENSATION: Denies paresthesias   POSTURE: No Significant postural limitations  PALPATION: No trigger points noted   LOWER EXTREMITY ROM:  Passive ROM Right eval Left eval  Hip flexion    Hip extension    Hip abduction    Hip adduction    Hip internal rotation    Hip external rotation    Knee flexion    Knee extension    Ankle dorsiflexion    Ankle plantarflexion    Ankle inversion    Ankle eversion     (Blank rows = not tested)  WNL  LOWER EXTREMITY MMT:  MMT Right eval Left eval Right  7/22 Left  7/2 Right  9/12 Left 9/12 Right 06/13/23 Left 06/13/23 Right 08/02/23 Left 08/02/23  Hip flexion 21.7 29.7 23.5 25.9 24.6 25.6 41.3 37.8 47 45.5  Hip extension            Hip abduction 27.6 18.5 19.1 8.7 21.2 14.4 29.9 29.5 35.5 35.3  Hip adduction            Hip internal rotation            Hip external rotation            Knee flexion            Knee extension 23.5 20.2 17.7 16.8 22.7 16.3 39.4 49.8 48.8 53.5  Ankle dorsiflexion            Ankle plantarflexion            Ankle inversion            Ankle eversion             (Blank rows = not tested)  GAIT: Decreased hip flexion; leans to the left; right hip frop. Patient required CGA while walking and min a when she turned for balance. No change from IE   Functional tests:  Sit to stand: cuing to catch balance. Uses momentum to stand.    5x sit to stand Balance: 13 sec   9/12 5x sit to stand 13 sec Narrow base min a  Narrow base eyes  closed min to mod a  Tandem stance Min a both ways.  Single leg stance 5 sec bilateral     06/13/23: 285 feet with walking stick, CGA  for safety  Reassessment 08/02/23:  258 feet with walking stick, CGA  for safety 5xSTS: 13.5 seconds with poor eccentric control  BERG Balance Test          Date:      Sit to Stand 1 2 06/13/23: 2 08/02/23: 3  Standing unsupported 3 3 3 3   Sitting with back unsupported but feet supported 1 3 4 4   Stand to sit  1 2 3 3   Transfers  2 1 2 2   Standing unsupported with eyes closed 3 3 3 3   Standing unsupported feet together 3 3 3 3   From standing position, reach forward with outstretched arm 1 2 3 3   From standing position, pick up object from floor 1 2 3 3   From standing position, turn and look behind over each shoulder 1 2 2 3   Turn 360 2 1 2 2   Standing unsupported, alternately place foot on step 1 1 2 2   Standing unsupported, one foot in front 1 1 2 2   Standing on one leg 0 0 2 1  Total:  21/56 26/56 34/56  33/56      TODAY'S TREATMENT: 08/02/23 Reassessment  Nustep 6 minutes for dynamic warm up and conditioning level 5, seat 7  07/29/23 Nustep 6 minutes for dynamic warm up and conditioning level 5, seat 7 STS with overhead reach 3 x 10 Alternating step taps 4 inch with contralateral shoulder flexion 2 x 10 bilateral  Shoulder horizontal abduction RTB 2 x 10 Bilateral shoulder ER RTB 2 x 10 Shoulder PNF D2 RTB 2 x 10  Seated small tricep dips 2 x 10  Semi tandem stance x 30sec each  07/19/23 Nustep 6 minutes for dynamic warm up and conditioning level 5, seat 7 LAQ 2.5 # 2 x 10  Bilateral shoulder press 3# 2 x 15 bilateral (2nd set alternating UE) Bilateral bicep curl 3# x 15 Bilateral punch 3# 2 x 15 bilateral Forward hurdles x 3 leading with RLE x 6 (intermittent UE on rail) Lateral step over hurdle x 10 (challenge) Standing marching 2 x 10 Marching on airex pad with SBA/CGA x 15 NBOS on Airex pad x 20sec SBA Semi tandem stance  x 30sec each   07/07/23 Nustep 6 minutes for dynamic warm up and conditioning level 5, seat 7 LAQ 2 # 2 x 10  Bilateral shoulder press 3# 2 x 15 bilateral  Bilateral punch 3# 3 x 15 bilateral Standing alternating march 2 x 10 Bridge 2 x 10  Supine shoulder flexion 1# bar 2 x 10 Supine alternating march 2 x 10 bilateral Supine alternating march with opposite UE raise 2 x 10 bilateral   07/05/23 Nustep 6 minutes for dynamic warm up and conditioning level 5, seat 7 Seated row RTB 2 x 10 Bilateral shoulder press 3# 2 x 15 bilateral  Bilateral punch 3# 3 x 15 bilateral Bilateral curl 3 #2 x 15 bilateral  Standing alternating march 2 x 10   06/30/23 Nustep 6 minutes for dynamic warm up and conditioning level 5 STS with overhead reach 3 x 10 Shoulder horizontal abduction RTB 2 x 10 Standing alternating march with contralateral shoulder flexion 2 x 10 bilateral Lateral stepping 4 x 15 feet at rail    06/28/23 Nustep 6 minutes for dynamic warm up and conditioning level 5 STS with overhead reach 3 x 10 Seated march RTB 2 x 15 Seated hip abduction RTB 2 x 15 LAQ 1 x 10 bilateral Shoulder bilateral ER RTB 2 x 10 Shoulder horizontal abduction RTB  2 x 10   06/23/23 STS with overhead reach 3 x 10 Forward step with overhead reach 2 x 10 bilateral Seated Row GTB 2 x 15 Lateral step with overhead reach 2 x 10 bilateral Bilateral shoulder press 3# 2 x 15 bilateral  Seated shoulder horizontal abduction RTB 2 x 10   06/21/23 Nustep 6 minutes for dynamic warm up and conditioning level 5 Seated long sitting hamstring stretch 3 x 30 second holds Seated hamstring isometric 10 x 5 second holds Bilateral shoulder press 3# 2 x 15 bilateral  Bilateral punch 3# 3 x 15 Bilateral curl 3 #2 x 15   10/18 Airex: Narrow base of support eyes open 3 x 20-second hold Airex tandem stance 2 x 20-second hold  Heel/toe rock 2 x 15 Standing march 2 x 15  Lateral walk 5 feet with upper extremity  assist and mod assist for balance  Hurdle step over 2 x 10  Bilateral press 2 x 15 Bilateral punch 3 x 15 Bilateral curl 2 x 15 All performed with 2 pound weight     06/13/23 Reassessment   06/06/23 BP 128/80 Nustep 5 minutes for dynamic warm up and conditioning level 5 Bilateral shoulder ER RTB 2 x 10  Seated shoulder horizontal abduction RTB 2 x 10 LAQ 2# 1 x 10 with 5 second holds Standing alternating march 2 x 10 bilateral Standing hip abduction 2 x 10 bilateral Squat with UE support 2 x 10 NBOS on foam 2 x 30 second holds  PATIENT EDUCATION:  Education details: exercise rationale and technique   Person educated: Patient Education method: Explanation, Demonstration, Tactile cues, Verbal cues, Education comprehension: verbalized understanding, returned demonstration, verbal cues required, tactile cues required, and needs further education   HOME EXERCISE PROGRAM: Access Code: [email or web address] URL: [email or web address] Date: 06/28/2023 - Seated March with Resistance  - 1 x daily - 7 x weekly - 3 sets - 15 reps - Seated Hip Abduction with Resistance  - 1 x daily - 7 x weekly - 3 sets - 15 reps - Seated Long Arc Quad  - 1 x daily - 7 x weekly - 3 sets - 15 reps - Shoulder External Rotation and Scapular Retraction with Resistance  - 1 x daily - 7 x weekly - 2 sets - 10 reps - Seated Shoulder Horizontal Abduction with Resistance  - 1 x daily - 7 x weekly - 2 sets - 10 reps  ASSESSMENT:  CLINICAL IMPRESSION:  Patient has met 2/3 short term goals and 0/3 long term goals with ability to complete HEP and improvement in strength and balance. Remaining goals not met due to continued deficits in symptoms, strength, ROM, activity tolerance, gait, balance, and functional mobility. Patient has made good progress toward remaining goals. 1 new goal added for balance. Patient performing about the same overall as last progress note.  Extending POC 1-2x/week for 8 weeks although  patient will be missing time in January due to vacation. Patient continues to require skilled care for safety, balance and to reduce risk of fall/injury but is progressing well. Patient will continue to benefit from skilled physical therapy in order to improve function and reduce impairment.    OBJECTIVE IMPAIRMENTS Abnormal gait, decreased activity tolerance, decreased balance, decreased mobility, difficulty walking, decreased strength, impaired tone, and pain.   ACTIVITY LIMITATIONS carrying, lifting, bending, standing, squatting, stairs, transfers, bed mobility, dressing, and locomotion level  PARTICIPATION LIMITATIONS: meal prep, cleaning, laundry, driving, shopping, community activity, and yard work  PERSONAL FACTORS  Fibromyalgia; headaches, LBBB; osteopenia  are also affecting patient's functional outcome.   REHAB POTENTIAL: Good  CLINICAL DECISION MAKING: Evolving/moderate complexity increasing pain in her quads   EVALUATION COMPLEXITY: Moderate   GOALS: Goals reviewed with patient? Yes  SHORT TERM GOALS: Target date: 04/18/2023  Patient will transfer sit to stand smoothly and independently  Baseline: Goal status: depends on the day 9/12  2.  Patient will increase gross strength by 5lbs  Baseline:  Goal status: MET  3.  Patient will improve BERG score by 10  Baseline:  Goal status: MET LONG TERM GOALS: Target date:POC  Patient will go improve balance to CGA for all balance exercises to improve safety  Baseline:  Goal status: INITIAL  2.  Patient will report a 50% reduction in falls  Baseline:  Goal status: INITIAL  3.  Patient will have a full balance program  Baseline:  Goal status: INITIAL 4. Patient will score at least 38/56 on Berg balance scale in order to demonstrate improved balance to reduce the risk for falls at home and in the community.  Baseline:  Goal status: INITIAL   PLAN: PT FREQUENCY: 2x/week  PT DURATION: 8 weeks  PLANNED INTERVENTIONS:  Therapeutic exercises, Therapeutic activity, Neuromuscular re-education, Balance training, Gait training, Patient/Family education, Self Care, Joint mobilization, Stair training, Aquatic Therapy, Cryotherapy, Moist heat, Taping, Manual therapy, and Re-evaluation  PLAN FOR NEXT SESSION: work on improving sit to stand, posterior, dynamic, and static balance. Work on general conditioning. Be aware of leg pain on any given day.    Reola Mosher Venida Tsukamoto, PT 08/02/2023, 11:40 AM

## 2023-08-04 ENCOUNTER — Ambulatory Visit (HOSPITAL_BASED_OUTPATIENT_CLINIC_OR_DEPARTMENT_OTHER): Payer: Medicare Other | Admitting: Physical Therapy

## 2023-08-04 ENCOUNTER — Encounter (HOSPITAL_BASED_OUTPATIENT_CLINIC_OR_DEPARTMENT_OTHER): Payer: Self-pay | Admitting: Physical Therapy

## 2023-08-04 DIAGNOSIS — R2689 Other abnormalities of gait and mobility: Secondary | ICD-10-CM

## 2023-08-04 DIAGNOSIS — G20A1 Parkinson's disease without dyskinesia, without mention of fluctuations: Secondary | ICD-10-CM | POA: Diagnosis not present

## 2023-08-04 NOTE — Therapy (Signed)
OUTPATIENT PHYSICAL THERAPY LOWER EXTREMITY TREATMENT   Patient Name: Morgan [name] MRN: [id number] DOB:Oct 31, 1950, 72 y.o., female Today's Date: 08/04/2023     PT End of Session - 08/04/23 1154     Visit Number 27    Number of Visits 56    Date for PT Re-Evaluation 09/27/23    Progress Note Due on Visit 36    PT Start Time 1150    PT Stop Time 1228    PT Time Calculation (min) 38 min    Equipment Utilized During Treatment Gait belt    Activity Tolerance Patient tolerated treatment well    Behavior During Therapy WFL for tasks assessed/performed                  Past Medical History:  Diagnosis Date   Anxiety    Fibromyalgia    Headache    Hypercholesteremia    controlled on medication   Hyperlipidemia    Hypertension    no current meds   LBBB (left bundle branch block)    Melanoma (HCC)    MVP (mitral valve prolapse)    Osteopenia    Parkinson disease (HCC)    Pneumonia    SVD (spontaneous vaginal delivery)    x 2   Past Surgical History:  Procedure Laterality Date   BLEPHAROPLASTY     cold knife conization     COLONOSCOPY     FH colon CA   CYSTOSCOPY W/ URETERAL STENT PLACEMENT Left 10/18/2021   Procedure: CYSTOSCOPY WITH RETROGRADE PYELOGRAM, URETERAL STENT PLACEMENT;  Surgeon: Jerilee Field, MD;  Location: [facility];  Service: Urology;  Laterality: Left;   DILATATION & CURETTAGE/HYSTEROSCOPY WITH MYOSURE N/A 02/14/2018   Procedure: ATTEMPTED DILATATION & CURETTAGE/HYSTEROSCOPY WITH MYOSURE;  Surgeon: Gerald Leitz, MD;  Location: [facility];  Service: Gynecology;  Laterality: N/A;  polyp   melanoma cancer     removed from left leg   RECONSTRUCTION OF EYELID     TONSILLECTOMY     TUBAL LIGATION     URETEROSCOPY WITH HOLMIUM LASER LITHOTRIPSY Left 12/17/2021   Procedure: URETEROSCOPY/HOLMIUM LASER/STENT EXCHANGE;  Surgeon: Jerilee Field, MD;  Location: [facility];  Service: Urology;  Laterality: Left;   WISDOM TOOTH EXTRACTION     Patient Active  Problem List   Diagnosis Date Noted   COVID-19 virus infection 05/16/2022   HTN (hypertension) 10/20/2021   Physical deconditioning 10/20/2021   Hydronephrosis with infection 10/18/2021   Bacteremia due to Escherichia coli 10/18/2021   Severe sepsis (HCC) 10/17/2021   UTI (urinary tract infection) 10/17/2021   Acute metabolic encephalopathy 10/17/2021   Parkinson disease (HCC) 10/17/2021   Cervical stenosis (uterine cervix) 02/14/2018   Postmenopausal bleeding 02/14/2018   LBBB (left bundle branch block) 12/25/2015   PVCs (premature ventricular contractions) 12/25/2015   Hyperlipidemia 12/25/2015   Fibromyalgia 01/16/2014   Cholelithiasis 04/25/2013   Paralysis agitans (HCC) 01/15/2013   Hereditary and idiopathic peripheral neuropathy 01/15/2013     [name] MD PCP:   Cindi Carbon PROVIDER: Antoine Primas MD   REFERRING DIAG: Parkinsons   THERAPY DIAG:  Other abnormalities of gait and mobility  Parkinson's disease without dyskinesia or fluctuating manifestations (HCC)  Rationale for Evaluation and Treatment Rehabilitation  ONSET DATE: Several years   SUBJECTIVE:   SUBJECTIVE STATEMENT: Patient states has been walking good today.   PERTINENT HISTORY: Fibromyalgia; headaches, LBBB; osteopenia   PAIN:  Are you having pain? no: NPRS scale: 0/10 Location: Pain description:    Aggravating factors: just comes  and goes  Relieving factors: just comes and goes; sometimes ibuprofen works but she is limited as to how much she can take  PRECAUTIONS: Patient reports that because of her heart she will pass out at times.   WEIGHT BEARING RESTRICTIONS No  FALLS:  Has patient fallen in last 6 months? Yes.  6 falls but likley more. Last fall    LIVING ENVIRONMENT: Stairs to the second floor in her house but she dosen't do much  4 steps in the front  2 steps in the garage  OCCUPATION: retired   Presenter, broadcasting: reading; goes to an exercise class   PLOF: Independent with  household mobility with device  PATIENT GOALS  To have less pain in her legs and to move better    OBJECTIVE:  (measurements taken at evaluation unless otherwise noted)   DIAGNOSTIC FINDINGS:   PATIENT SURVEYS:  FOTO    COGNITION:  Overall cognitive status: Within functional limits for tasks assessed     SENSATION: Denies paresthesias   POSTURE: No Significant postural limitations  PALPATION: No trigger points noted   LOWER EXTREMITY ROM:  Passive ROM Right eval Left eval  Hip flexion    Hip extension    Hip abduction    Hip adduction    Hip internal rotation    Hip external rotation    Knee flexion    Knee extension    Ankle dorsiflexion    Ankle plantarflexion    Ankle inversion    Ankle eversion     (Blank rows = not tested)  WNL  LOWER EXTREMITY MMT:  MMT Right eval Left eval Right  7/22 Left  7/2 Right  9/12 Left 9/12 Right 06/13/23 Left 06/13/23 Right 08/02/23 Left 08/02/23  Hip flexion 21.7 29.7 23.5 25.9 24.6 25.6 41.3 37.8 47 45.5  Hip extension            Hip abduction 27.6 18.5 19.1 8.7 21.2 14.4 29.9 29.5 35.5 35.3  Hip adduction            Hip internal rotation            Hip external rotation            Knee flexion            Knee extension 23.5 20.2 17.7 16.8 22.7 16.3 39.4 49.8 48.8 53.5  Ankle dorsiflexion            Ankle plantarflexion            Ankle inversion            Ankle eversion             (Blank rows = not tested)  GAIT: Decreased hip flexion; leans to the left; right hip frop. Patient required CGA while walking and min a when she turned for balance. No change from IE   Functional tests:  Sit to stand: cuing to catch balance. Uses momentum to stand.    5x sit to stand Balance: 13 sec   9/12 5x sit to stand 13 sec Narrow base min a  Narrow base eyes closed min to mod a  Tandem stance Min a both ways.  Single leg stance 5 sec bilateral     06/13/23: 285 feet with walking stick, CGA  for  safety  Reassessment 08/02/23:  258 feet with walking stick, CGA  for safety 5xSTS: 13.5 seconds with poor eccentric control  BERG Balance Test  Date:      Sit to Stand 1 2 06/13/23: 2 08/02/23: 3  Standing unsupported 3 3 3 3   Sitting with back unsupported but feet supported 1 3 4 4   Stand to sit  1 2 3 3   Transfers  2 1 2 2   Standing unsupported with eyes closed 3 3 3 3   Standing unsupported feet together 3 3 3 3   From standing position, reach forward with outstretched arm 1 2 3 3   From standing position, pick up object from floor 1 2 3 3   From standing position, turn and look behind over each shoulder 1 2 2 3   Turn 360 2 1 2 2   Standing unsupported, alternately place foot on step 1 1 2 2   Standing unsupported, one foot in front 1 1 2 2   Standing on one leg 0 0 2 1  Total:  21/56 26/56 34/56  33/56      TODAY'S TREATMENT: 08/04/23 Nustep 6 minutes for dynamic warm up and conditioning level 5, seat 7 Step up with contralateral knee drive 4 inch 2 x 10 bilateral  Anterior step with overhead reach 2 x 10 bilateral Lateral step with overhead reach 2 x 10 bilateral Shoulder horizontal abduction RTB 2 x 10 Bilateral shoulder ER RTB 2 x 10 Lateral stepping at rail 4 x 15 feet bilateral  Shoulder flexion RTB in hands bilateral 2 x 10   08/02/23 Reassessment  Nustep 6 minutes for dynamic warm up and conditioning level 5, seat 7  07/29/23 Nustep 6 minutes for dynamic warm up and conditioning level 5, seat 7 STS with overhead reach 3 x 10 Alternating step taps 4 inch with contralateral shoulder flexion 2 x 10 bilateral  Shoulder horizontal abduction RTB 2 x 10 Bilateral shoulder ER RTB 2 x 10 Shoulder PNF D2 RTB 2 x 10  Seated small tricep dips 2 x 10  Semi tandem stance x 30sec each  07/19/23 Nustep 6 minutes for dynamic warm up and conditioning level 5, seat 7 LAQ 2.5 # 2 x 10  Bilateral shoulder press 3# 2 x 15 bilateral (2nd set alternating UE) Bilateral bicep  curl 3# x 15 Bilateral punch 3# 2 x 15 bilateral Forward hurdles x 3 leading with RLE x 6 (intermittent UE on rail) Lateral step over hurdle x 10 (challenge) Standing marching 2 x 10 Marching on airex pad with SBA/CGA x 15 NBOS on Airex pad x 20sec SBA Semi tandem stance x 30sec each   07/07/23 Nustep 6 minutes for dynamic warm up and conditioning level 5, seat 7 LAQ 2 # 2 x 10  Bilateral shoulder press 3# 2 x 15 bilateral  Bilateral punch 3# 3 x 15 bilateral Standing alternating march 2 x 10 Bridge 2 x 10  Supine shoulder flexion 1# bar 2 x 10 Supine alternating march 2 x 10 bilateral Supine alternating march with opposite UE raise 2 x 10 bilateral   07/05/23 Nustep 6 minutes for dynamic warm up and conditioning level 5, seat 7 Seated row RTB 2 x 10 Bilateral shoulder press 3# 2 x 15 bilateral  Bilateral punch 3# 3 x 15 bilateral Bilateral curl 3 #2 x 15 bilateral  Standing alternating march 2 x 10   06/30/23 Nustep 6 minutes for dynamic warm up and conditioning level 5 STS with overhead reach 3 x 10 Shoulder horizontal abduction RTB 2 x 10 Standing alternating march with contralateral shoulder flexion 2 x 10 bilateral Lateral stepping 4 x 15 feet at rail  06/28/23 Nustep 6 minutes for dynamic warm up and conditioning level 5 STS with overhead reach 3 x 10 Seated march RTB 2 x 15 Seated hip abduction RTB 2 x 15 LAQ 1 x 10 bilateral Shoulder bilateral ER RTB 2 x 10 Shoulder horizontal abduction RTB 2 x 10   06/23/23 STS with overhead reach 3 x 10 Forward step with overhead reach 2 x 10 bilateral Seated Row GTB 2 x 15 Lateral step with overhead reach 2 x 10 bilateral Bilateral shoulder press 3# 2 x 15 bilateral  Seated shoulder horizontal abduction RTB 2 x 10   06/21/23 Nustep 6 minutes for dynamic warm up and conditioning level 5 Seated long sitting hamstring stretch 3 x 30 second holds Seated hamstring isometric 10 x 5 second holds Bilateral shoulder  press 3# 2 x 15 bilateral  Bilateral punch 3# 3 x 15 Bilateral curl 3 #2 x 15   10/18 Airex: Narrow base of support eyes open 3 x 20-second hold Airex tandem stance 2 x 20-second hold  Heel/toe rock 2 x 15 Standing march 2 x 15  Lateral walk 5 feet with upper extremity assist and mod assist for balance  Hurdle step over 2 x 10  Bilateral press 2 x 15 Bilateral punch 3 x 15 Bilateral curl 2 x 15 All performed with 2 pound weight      PATIENT EDUCATION:  Education details: exercise rationale and technique   Person educated: Patient Education method: Explanation, Demonstration, Tactile cues, Verbal cues, Education comprehension: verbalized understanding, returned demonstration, verbal cues required, tactile cues required, and needs further education   HOME EXERCISE PROGRAM: Access Code: [email or web address] URL: [email or web address] Date: 06/28/2023 - Seated March with Resistance  - 1 x daily - 7 x weekly - 3 sets - 15 reps - Seated Hip Abduction with Resistance  - 1 x daily - 7 x weekly - 3 sets - 15 reps - Seated Long Arc Quad  - 1 x daily - 7 x weekly - 3 sets - 15 reps - Shoulder External Rotation and Scapular Retraction with Resistance  - 1 x daily - 7 x weekly - 2 sets - 10 reps - Seated Shoulder Horizontal Abduction with Resistance  - 1 x daily - 7 x weekly - 2 sets - 10 reps  ASSESSMENT:  CLINICAL IMPRESSION: Patient ambulating with improving gait mechanics and balance today. Tolerates performance of additional standing exercises with intermittent cueing to slow down for improving mechanics. Patient will continue to benefit from skilled physical therapy in order to improve function and reduce impairment.    OBJECTIVE IMPAIRMENTS Abnormal gait, decreased activity tolerance, decreased balance, decreased mobility, difficulty walking, decreased strength, impaired tone, and pain.   ACTIVITY LIMITATIONS carrying, lifting, bending, standing, squatting, stairs,  transfers, bed mobility, dressing, and locomotion level  PARTICIPATION LIMITATIONS: meal prep, cleaning, laundry, driving, shopping, community activity, and yard work  PERSONAL FACTORS Fibromyalgia; headaches, LBBB; osteopenia  are also affecting patient's functional outcome.   REHAB POTENTIAL: Good  CLINICAL DECISION MAKING: Evolving/moderate complexity increasing pain in her quads   EVALUATION COMPLEXITY: Moderate   GOALS: Goals reviewed with patient? Yes  SHORT TERM GOALS: Target date: 04/18/2023  Patient will transfer sit to stand smoothly and independently  Baseline: Goal status: depends on the day 9/12  2.  Patient will increase gross strength by 5lbs  Baseline:  Goal status: MET  3.  Patient will improve BERG score by 10  Baseline:  Goal status: MET LONG TERM GOALS: Target date:POC  Patient will go improve balance to CGA for all balance exercises to improve safety  Baseline:  Goal status: INITIAL  2.  Patient will report a 50% reduction in falls  Baseline:  Goal status: INITIAL  3.  Patient will have a full balance program  Baseline:  Goal status: INITIAL 4. Patient will score at least 38/56 on Berg balance scale in order to demonstrate improved balance to reduce the risk for falls at home and in the community.  Baseline:  Goal status: INITIAL   PLAN: PT FREQUENCY: 2x/week  PT DURATION: 8 weeks  PLANNED INTERVENTIONS: Therapeutic exercises, Therapeutic activity, Neuromuscular re-education, Balance training, Gait training, Patient/Family education, Self Care, Joint mobilization, Stair training, Aquatic Therapy, Cryotherapy, Moist heat, Taping, Manual therapy, and Re-evaluation  PLAN FOR NEXT SESSION: work on improving sit to stand, posterior, dynamic, and static balance. Work on general conditioning. Be aware of leg pain on any given day.    Reola Mosher Jsean Taussig, PT 08/04/2023, 11:55 AM

## 2023-08-09 ENCOUNTER — Encounter (HOSPITAL_BASED_OUTPATIENT_CLINIC_OR_DEPARTMENT_OTHER): Payer: Medicare Other | Admitting: Physical Therapy

## 2023-08-09 DIAGNOSIS — G20B2 Parkinson's disease with dyskinesia, with fluctuations: Secondary | ICD-10-CM | POA: Diagnosis not present

## 2023-08-09 DIAGNOSIS — R413 Other amnesia: Secondary | ICD-10-CM | POA: Diagnosis not present

## 2023-08-11 ENCOUNTER — Ambulatory Visit (HOSPITAL_BASED_OUTPATIENT_CLINIC_OR_DEPARTMENT_OTHER): Payer: Medicare Other | Admitting: Physical Therapy

## 2023-08-11 ENCOUNTER — Encounter (HOSPITAL_BASED_OUTPATIENT_CLINIC_OR_DEPARTMENT_OTHER): Payer: Self-pay | Admitting: Physical Therapy

## 2023-08-11 DIAGNOSIS — G20A1 Parkinson's disease without dyskinesia, without mention of fluctuations: Secondary | ICD-10-CM | POA: Diagnosis not present

## 2023-08-11 DIAGNOSIS — R2689 Other abnormalities of gait and mobility: Secondary | ICD-10-CM | POA: Diagnosis not present

## 2023-08-11 NOTE — Therapy (Signed)
OUTPATIENT PHYSICAL THERAPY LOWER EXTREMITY TREATMENT   Patient Name: Morgan [name] MRN: [id number] DOB:1950-12-30, 72 y.o., female Today's Date: 08/11/2023     PT End of Session - 08/11/23 1155     Visit Number 28    Number of Visits 56    Date for PT Re-Evaluation 09/27/23    Progress Note Due on Visit 36    PT Start Time 1150    PT Stop Time 1230    PT Time Calculation (min) 40 min    Equipment Utilized During Treatment Gait belt    Activity Tolerance Patient tolerated treatment well    Behavior During Therapy WFL for tasks assessed/performed                  Past Medical History:  Diagnosis Date   Anxiety    Fibromyalgia    Headache    Hypercholesteremia    controlled on medication   Hyperlipidemia    Hypertension    no current meds   LBBB (left bundle branch block)    Melanoma (HCC)    MVP (mitral valve prolapse)    Osteopenia    Parkinson disease (HCC)    Pneumonia    SVD (spontaneous vaginal delivery)    x 2   Past Surgical History:  Procedure Laterality Date   BLEPHAROPLASTY     cold knife conization     COLONOSCOPY     FH colon CA   CYSTOSCOPY W/ URETERAL STENT PLACEMENT Left 10/18/2021   Procedure: CYSTOSCOPY WITH RETROGRADE PYELOGRAM, URETERAL STENT PLACEMENT;  Surgeon: Jerilee Field, MD;  Location: [facility];  Service: Urology;  Laterality: Left;   DILATATION & CURETTAGE/HYSTEROSCOPY WITH MYOSURE N/A 02/14/2018   Procedure: ATTEMPTED DILATATION & CURETTAGE/HYSTEROSCOPY WITH MYOSURE;  Surgeon: Gerald Leitz, MD;  Location: [facility];  Service: Gynecology;  Laterality: N/A;  polyp   melanoma cancer     removed from left leg   RECONSTRUCTION OF EYELID     TONSILLECTOMY     TUBAL LIGATION     URETEROSCOPY WITH HOLMIUM LASER LITHOTRIPSY Left 12/17/2021   Procedure: URETEROSCOPY/HOLMIUM LASER/STENT EXCHANGE;  Surgeon: Jerilee Field, MD;  Location: [facility];  Service: Urology;  Laterality: Left;   WISDOM TOOTH EXTRACTION     Patient Active  Problem List   Diagnosis Date Noted   COVID-19 virus infection 05/16/2022   HTN (hypertension) 10/20/2021   Physical deconditioning 10/20/2021   Hydronephrosis with infection 10/18/2021   Bacteremia due to Escherichia coli 10/18/2021   Severe sepsis (HCC) 10/17/2021   UTI (urinary tract infection) 10/17/2021   Acute metabolic encephalopathy 10/17/2021   Parkinson disease (HCC) 10/17/2021   Cervical stenosis (uterine cervix) 02/14/2018   Postmenopausal bleeding 02/14/2018   LBBB (left bundle branch block) 12/25/2015   PVCs (premature ventricular contractions) 12/25/2015   Hyperlipidemia 12/25/2015   Fibromyalgia 01/16/2014   Cholelithiasis 04/25/2013   Paralysis agitans (HCC) 01/15/2013   Hereditary and idiopathic peripheral neuropathy 01/15/2013     [name] MD PCP:   Cindi Carbon PROVIDER: Antoine Primas MD   REFERRING DIAG: Parkinsons   THERAPY DIAG:  Other abnormalities of gait and mobility  Parkinson's disease without dyskinesia or fluctuating manifestations (HCC)  Rationale for Evaluation and Treatment Rehabilitation  ONSET DATE: Several years   SUBJECTIVE:   SUBJECTIVE STATEMENT: Patient states they changed up her med schedule per MD and today is day 2 of that. 1 fall yesterday, all of a sudden she was on the floor.  PERTINENT HISTORY: Fibromyalgia; headaches, LBBB; osteopenia  PAIN:  Are you having pain? no: NPRS scale: 0/10 Location: Pain description:    Aggravating factors: just comes and goes  Relieving factors: just comes and goes; sometimes ibuprofen works but she is limited as to how much she can take  PRECAUTIONS: Patient reports that because of her heart she will pass out at times.   WEIGHT BEARING RESTRICTIONS No  FALLS:  Has patient fallen in last 6 months? Yes.  6 falls but likley more. Last fall    LIVING ENVIRONMENT: Stairs to the second floor in her house but she dosen't do much  4 steps in the front  2 steps in the garage   OCCUPATION: retired   Presenter, broadcasting: reading; goes to an exercise class   PLOF: Independent with household mobility with device  PATIENT GOALS  To have less pain in her legs and to move better    OBJECTIVE:  (measurements taken at evaluation unless otherwise noted)   DIAGNOSTIC FINDINGS:   PATIENT SURVEYS:  FOTO    COGNITION:  Overall cognitive status: Within functional limits for tasks assessed     SENSATION: Denies paresthesias   POSTURE: No Significant postural limitations  PALPATION: No trigger points noted   LOWER EXTREMITY ROM:  Passive ROM Right eval Left eval  Hip flexion    Hip extension    Hip abduction    Hip adduction    Hip internal rotation    Hip external rotation    Knee flexion    Knee extension    Ankle dorsiflexion    Ankle plantarflexion    Ankle inversion    Ankle eversion     (Blank rows = not tested)  WNL  LOWER EXTREMITY MMT:  MMT Right eval Left eval Right  7/22 Left  7/2 Right  9/12 Left 9/12 Right 06/13/23 Left 06/13/23 Right 08/02/23 Left 08/02/23  Hip flexion 21.7 29.7 23.5 25.9 24.6 25.6 41.3 37.8 47 45.5  Hip extension            Hip abduction 27.6 18.5 19.1 8.7 21.2 14.4 29.9 29.5 35.5 35.3  Hip adduction            Hip internal rotation            Hip external rotation            Knee flexion            Knee extension 23.5 20.2 17.7 16.8 22.7 16.3 39.4 49.8 48.8 53.5  Ankle dorsiflexion            Ankle plantarflexion            Ankle inversion            Ankle eversion             (Blank rows = not tested)  GAIT: Decreased hip flexion; leans to the left; right hip frop. Patient required CGA while walking and min a when she turned for balance. No change from IE   Functional tests:  Sit to stand: cuing to catch balance. Uses momentum to stand.    5x sit to stand Balance: 13 sec   9/12 5x sit to stand 13 sec Narrow base min a  Narrow base eyes closed min to mod a  Tandem stance Min a both ways.   Single leg stance 5 sec bilateral     06/13/23: 285 feet with walking stick, CGA  for safety  Reassessment 08/02/23:  258 feet with walking stick, CGA  for safety 5xSTS: 13.5 seconds with poor eccentric control  BERG Balance Test          Date:      Sit to Stand 1 2 06/13/23: 2 08/02/23: 3  Standing unsupported 3 3 3 3   Sitting with back unsupported but feet supported 1 3 4 4   Stand to sit  1 2 3 3   Transfers  2 1 2 2   Standing unsupported with eyes closed 3 3 3 3   Standing unsupported feet together 3 3 3 3   From standing position, reach forward with outstretched arm 1 2 3 3   From standing position, pick up object from floor 1 2 3 3   From standing position, turn and look behind over each shoulder 1 2 2 3   Turn 360 2 1 2 2   Standing unsupported, alternately place foot on step 1 1 2 2   Standing unsupported, one foot in front 1 1 2 2   Standing on one leg 0 0 2 1  Total:  21/56 26/56 34/56  33/56      TODAY'S TREATMENT: 08/04/23 Nustep 6 minutes for dynamic warm up and conditioning level 5, seat 7 Seated punches 3# 3 x 10 Seated shoulder press 3# 3 x 10  Seated small tricep dips 2 x 10 Seated bicep curls 3# 3 x 10 Standing lunges with UE support 2 x 10 bilateral Shoulder abduction 1# 2 x 10   08/04/23 Nustep 6 minutes for dynamic warm up and conditioning level 5, seat 7 Step up with contralateral knee drive 4 inch 2 x 10 bilateral  Anterior step with overhead reach 2 x 10 bilateral Lateral step with overhead reach 2 x 10 bilateral Shoulder horizontal abduction RTB 2 x 10 Bilateral shoulder ER RTB 2 x 10 Lateral stepping at rail 4 x 15 feet bilateral  Shoulder flexion RTB in hands bilateral 2 x 10   08/02/23 Reassessment  Nustep 6 minutes for dynamic warm up and conditioning level 5, seat 7  07/29/23 Nustep 6 minutes for dynamic warm up and conditioning level 5, seat 7 STS with overhead reach 3 x 10 Alternating step taps 4 inch with contralateral shoulder flexion 2  x 10 bilateral  Shoulder horizontal abduction RTB 2 x 10 Bilateral shoulder ER RTB 2 x 10 Shoulder PNF D2 RTB 2 x 10  Seated small tricep dips 2 x 10  Semi tandem stance x 30sec each  07/19/23 Nustep 6 minutes for dynamic warm up and conditioning level 5, seat 7 LAQ 2.5 # 2 x 10  Bilateral shoulder press 3# 2 x 15 bilateral (2nd set alternating UE) Bilateral bicep curl 3# x 15 Bilateral punch 3# 2 x 15 bilateral Forward hurdles x 3 leading with RLE x 6 (intermittent UE on rail) Lateral step over hurdle x 10 (challenge) Standing marching 2 x 10 Marching on airex pad with SBA/CGA x 15 NBOS on Airex pad x 20sec SBA Semi tandem stance x 30sec each   07/07/23 Nustep 6 minutes for dynamic warm up and conditioning level 5, seat 7 LAQ 2 # 2 x 10  Bilateral shoulder press 3# 2 x 15 bilateral  Bilateral punch 3# 3 x 15 bilateral Standing alternating march 2 x 10 Bridge 2 x 10  Supine shoulder flexion 1# bar 2 x 10 Supine alternating march 2 x 10 bilateral Supine alternating march with opposite UE raise 2 x 10 bilateral   07/05/23 Nustep 6 minutes for dynamic warm up and conditioning level 5, seat 7 Seated row  RTB 2 x 10 Bilateral shoulder press 3# 2 x 15 bilateral  Bilateral punch 3# 3 x 15 bilateral Bilateral curl 3 #2 x 15 bilateral  Standing alternating march 2 x 10   06/30/23 Nustep 6 minutes for dynamic warm up and conditioning level 5 STS with overhead reach 3 x 10 Shoulder horizontal abduction RTB 2 x 10 Standing alternating march with contralateral shoulder flexion 2 x 10 bilateral Lateral stepping 4 x 15 feet at rail    06/28/23 Nustep 6 minutes for dynamic warm up and conditioning level 5 STS with overhead reach 3 x 10 Seated march RTB 2 x 15 Seated hip abduction RTB 2 x 15 LAQ 1 x 10 bilateral Shoulder bilateral ER RTB 2 x 10 Shoulder horizontal abduction RTB 2 x 10   06/23/23 STS with overhead reach 3 x 10 Forward step with overhead reach 2 x 10  bilateral Seated Row GTB 2 x 15 Lateral step with overhead reach 2 x 10 bilateral Bilateral shoulder press 3# 2 x 15 bilateral  Seated shoulder horizontal abduction RTB 2 x 10   06/21/23 Nustep 6 minutes for dynamic warm up and conditioning level 5 Seated long sitting hamstring stretch 3 x 30 second holds Seated hamstring isometric 10 x 5 second holds Bilateral shoulder press 3# 2 x 15 bilateral  Bilateral punch 3# 3 x 15 Bilateral curl 3 #2 x 15   10/18 Airex: Narrow base of support eyes open 3 x 20-second hold Airex tandem stance 2 x 20-second hold  Heel/toe rock 2 x 15 Standing march 2 x 15  Lateral walk 5 feet with upper extremity assist and mod assist for balance  Hurdle step over 2 x 10  Bilateral press 2 x 15 Bilateral punch 3 x 15 Bilateral curl 2 x 15 All performed with 2 pound weight      PATIENT EDUCATION:  Education details: exercise rationale and technique   Person educated: Patient Education method: Explanation, Demonstration, Tactile cues, Verbal cues, Education comprehension: verbalized understanding, returned demonstration, verbal cues required, tactile cues required, and needs further education   HOME EXERCISE PROGRAM: Access Code: [email or web address]   URL: [email or web address] Date: 06/28/2023 - Seated March with Resistance  - 1 x daily - 7 x weekly - 3 sets - 15 reps - Seated Hip Abduction with Resistance  - 1 x daily - 7 x weekly - 3 sets - 15 reps - Seated Long Arc Quad  - 1 x daily - 7 x weekly - 3 sets - 15 reps - Shoulder External Rotation and Scapular Retraction with Resistance  - 1 x daily - 7 x weekly - 2 sets - 10 reps - Seated Shoulder Horizontal Abduction with Resistance  - 1 x daily - 7 x weekly - 2 sets - 10 reps  08/11/23 - Seated Punches  - 1 x daily - 7 x weekly - 3 sets - 10 reps - Seated Shoulder Overhead Press with Dumbbells with PLB  - 1 x daily - 7 x weekly - 3 sets - 10 reps - Seated Bicep Curls Supinated with  Dumbbells  - 1 x daily - 7 x weekly - 3 sets - 10 reps - Seated Shoulder Abduction with Dumbbells - Thumbs Up  - 1 x daily - 7 x weekly - 3 sets - 10 reps  ASSESSMENT:  CLINICAL IMPRESSION: Patient ambulating with with good balance and gait today compared to some sessions recently. Performed UE strengthening with intermittent cueing for posture and mechanics. CGA  to Min A with lunges for balance. Patient will continue to benefit from skilled physical therapy in order to improve function and reduce impairment.    OBJECTIVE IMPAIRMENTS Abnormal gait, decreased activity tolerance, decreased balance, decreased mobility, difficulty walking, decreased strength, impaired tone, and pain.   ACTIVITY LIMITATIONS carrying, lifting, bending, standing, squatting, stairs, transfers, bed mobility, dressing, and locomotion level  PARTICIPATION LIMITATIONS: meal prep, cleaning, laundry, driving, shopping, community activity, and yard work  PERSONAL FACTORS Fibromyalgia; headaches, LBBB; osteopenia  are also affecting patient's functional outcome.   REHAB POTENTIAL: Good  CLINICAL DECISION MAKING: Evolving/moderate complexity increasing pain in her quads   EVALUATION COMPLEXITY: Moderate   GOALS: Goals reviewed with patient? Yes  SHORT TERM GOALS: Target date: 04/18/2023  Patient will transfer sit to stand smoothly and independently  Baseline: Goal status: depends on the day 9/12  2.  Patient will increase gross strength by 5lbs  Baseline:  Goal status: MET  3.  Patient will improve BERG score by 10  Baseline:  Goal status: MET LONG TERM GOALS: Target date:POC  Patient will go improve balance to CGA for all balance exercises to improve safety  Baseline:  Goal status: INITIAL  2.  Patient will report a 50% reduction in falls  Baseline:  Goal status: INITIAL  3.  Patient will have a full balance program  Baseline:  Goal status: INITIAL 4. Patient will score at least 38/56 on Berg  balance scale in order to demonstrate improved balance to reduce the risk for falls at home and in the community.  Baseline:  Goal status: INITIAL   PLAN: PT FREQUENCY: 2x/week  PT DURATION: 8 weeks  PLANNED INTERVENTIONS: Therapeutic exercises, Therapeutic activity, Neuromuscular re-education, Balance training, Gait training, Patient/Family education, Self Care, Joint mobilization, Stair training, Aquatic Therapy, Cryotherapy, Moist heat, Taping, Manual therapy, and Re-evaluation  PLAN FOR NEXT SESSION: work on improving sit to stand, posterior, dynamic, and static balance. Work on general conditioning. Be aware of leg pain on any given day.    Reola Mosher Jania Steinke, PT 08/11/2023, 11:56 AM

## 2023-08-16 ENCOUNTER — Ambulatory Visit (HOSPITAL_BASED_OUTPATIENT_CLINIC_OR_DEPARTMENT_OTHER): Payer: Medicare Other | Admitting: Physical Therapy

## 2023-08-16 ENCOUNTER — Encounter (HOSPITAL_BASED_OUTPATIENT_CLINIC_OR_DEPARTMENT_OTHER): Payer: Self-pay | Admitting: Physical Therapy

## 2023-08-16 DIAGNOSIS — G20A1 Parkinson's disease without dyskinesia, without mention of fluctuations: Secondary | ICD-10-CM

## 2023-08-16 DIAGNOSIS — R2689 Other abnormalities of gait and mobility: Secondary | ICD-10-CM

## 2023-08-16 NOTE — Therapy (Signed)
OUTPATIENT PHYSICAL THERAPY LOWER EXTREMITY TREATMENT   Patient Name: Morgan [name] MRN: [id number] DOB:[birth year], 72 y.o., female Today's Date: 08/16/2023     PT End of Session - 08/16/23 1013     Visit Number 29    Number of Visits 56    Date for PT Re-Evaluation 09/27/23    Progress Note Due on Visit 36    PT Start Time 1015    PT Stop Time 1053    PT Time Calculation (min) 38 min    Equipment Utilized During Treatment Gait belt    Activity Tolerance Patient tolerated treatment well    Behavior During Therapy WFL for tasks assessed/performed                  Past Medical History:  Diagnosis Date   Anxiety    Fibromyalgia    Headache    Hypercholesteremia    controlled on medication   Hyperlipidemia    Hypertension    no current meds   LBBB (left bundle branch block)    Melanoma (HCC)    MVP (mitral valve prolapse)    Osteopenia    Parkinson disease (HCC)    Pneumonia    SVD (spontaneous vaginal delivery)    x 2   Past Surgical History:  Procedure Laterality Date   BLEPHAROPLASTY     cold knife conization     COLONOSCOPY     FH colon CA   CYSTOSCOPY W/ URETERAL STENT PLACEMENT Left 10/18/2021   Procedure: CYSTOSCOPY WITH RETROGRADE PYELOGRAM, URETERAL STENT PLACEMENT;  Surgeon: Jerilee Field, MD;  Location: [facility];  Service: Urology;  Laterality: Left;   DILATATION & CURETTAGE/HYSTEROSCOPY WITH MYOSURE N/A 02/14/2018   Procedure: ATTEMPTED DILATATION & CURETTAGE/HYSTEROSCOPY WITH MYOSURE;  Surgeon: Gerald Leitz, MD;  Location: [facility];  Service: Gynecology;  Laterality: N/A;  polyp   melanoma cancer     removed from left leg   RECONSTRUCTION OF EYELID     TONSILLECTOMY     TUBAL LIGATION     URETEROSCOPY WITH HOLMIUM LASER LITHOTRIPSY Left 12/17/2021   Procedure: URETEROSCOPY/HOLMIUM LASER/STENT EXCHANGE;  Surgeon: Jerilee Field, MD;  Location: [facility];  Service: Urology;  Laterality: Left;   WISDOM TOOTH EXTRACTION     Patient Active  Problem List   Diagnosis Date Noted   COVID-19 virus infection 05/16/2022   HTN (hypertension) 10/20/2021   Physical deconditioning 10/20/2021   Hydronephrosis with infection 10/18/2021   Bacteremia due to Escherichia coli 10/18/2021   Severe sepsis (HCC) 10/17/2021   UTI (urinary tract infection) 10/17/2021   Acute metabolic encephalopathy 10/17/2021   Parkinson disease (HCC) 10/17/2021   Cervical stenosis (uterine cervix) 02/14/2018   Postmenopausal bleeding 02/14/2018   LBBB (left bundle branch block) 12/25/2015   PVCs (premature ventricular contractions) 12/25/2015   Hyperlipidemia 12/25/2015   Fibromyalgia 01/16/2014   Cholelithiasis 04/25/2013   Paralysis agitans (HCC) 01/15/2013   Hereditary and idiopathic peripheral neuropathy 01/15/2013     [name] MD PCP:   Cindi Carbon PROVIDER: Antoine Primas MD   REFERRING DIAG: Parkinsons   THERAPY DIAG:  Other abnormalities of gait and mobility  Parkinson's disease without dyskinesia or fluctuating manifestations (HCC)  Rationale for Evaluation and Treatment Rehabilitation  ONSET DATE: Several years   SUBJECTIVE:   SUBJECTIVE STATEMENT: Patient states doing well today. Balance and walking have been good in the mornings.   PERTINENT HISTORY: Fibromyalgia; headaches, LBBB; osteopenia   PAIN:  Are you having pain? no: NPRS scale: 0/10 Location: Pain description:  Aggravating factors: just comes and goes  Relieving factors: just comes and goes; sometimes ibuprofen works but she is limited as to how much she can take  PRECAUTIONS: Patient reports that because of her heart she will pass out at times.   WEIGHT BEARING RESTRICTIONS No  FALLS:  Has patient fallen in last 6 months? Yes.  6 falls but likley more. Last fall    LIVING ENVIRONMENT: Stairs to the second floor in her house but she dosen't do much  4 steps in the front  2 steps in the garage  OCCUPATION: retired   Presenter, broadcasting: reading; goes to an  exercise class   PLOF: Independent with household mobility with device  PATIENT GOALS  To have less pain in her legs and to move better    OBJECTIVE:  (measurements taken at evaluation unless otherwise noted)   DIAGNOSTIC FINDINGS:   PATIENT SURVEYS:  FOTO    COGNITION:  Overall cognitive status: Within functional limits for tasks assessed     SENSATION: Denies paresthesias   POSTURE: No Significant postural limitations  PALPATION: No trigger points noted   LOWER EXTREMITY ROM:  Passive ROM Right eval Left eval  Hip flexion    Hip extension    Hip abduction    Hip adduction    Hip internal rotation    Hip external rotation    Knee flexion    Knee extension    Ankle dorsiflexion    Ankle plantarflexion    Ankle inversion    Ankle eversion     (Blank rows = not tested)  WNL  LOWER EXTREMITY MMT:  MMT Right eval Left eval Right  7/22 Left  7/2 Right  9/12 Left 9/12 Right 06/13/23 Left 06/13/23 Right 08/02/23 Left 08/02/23  Hip flexion 21.7 29.7 23.5 25.9 24.6 25.6 41.3 37.8 47 45.5  Hip extension            Hip abduction 27.6 18.5 19.1 8.7 21.2 14.4 29.9 29.5 35.5 35.3  Hip adduction            Hip internal rotation            Hip external rotation            Knee flexion            Knee extension 23.5 20.2 17.7 16.8 22.7 16.3 39.4 49.8 48.8 53.5  Ankle dorsiflexion            Ankle plantarflexion            Ankle inversion            Ankle eversion             (Blank rows = not tested)  GAIT: Decreased hip flexion; leans to the left; right hip frop. Patient required CGA while walking and min a when she turned for balance. No change from IE   Functional tests:  Sit to stand: cuing to catch balance. Uses momentum to stand.    5x sit to stand Balance: 13 sec   9/12 5x sit to stand 13 sec Narrow base min a  Narrow base eyes closed min to mod a  Tandem stance Min a both ways.  Single leg stance 5 sec bilateral     06/13/23: 285 feet  with walking stick, CGA  for safety  Reassessment 08/02/23:  258 feet with walking stick, CGA  for safety 5xSTS: 13.5 seconds with poor eccentric control  BERG Balance Test  Date:      Sit to Stand 1 2 06/13/23: 2 08/02/23: 3  Standing unsupported 3 3 3 3   Sitting with back unsupported but feet supported 1 3 4 4   Stand to sit  1 2 3 3   Transfers  2 1 2 2   Standing unsupported with eyes closed 3 3 3 3   Standing unsupported feet together 3 3 3 3   From standing position, reach forward with outstretched arm 1 2 3 3   From standing position, pick up object from floor 1 2 3 3   From standing position, turn and look behind over each shoulder 1 2 2 3   Turn 360 2 1 2 2   Standing unsupported, alternately place foot on step 1 1 2 2   Standing unsupported, one foot in front 1 1 2 2   Standing on one leg 0 0 2 1  Total:  21/56 26/56 34/56  33/56      TODAY'S TREATMENT: 08/16/23 Nustep 6 minutes for dynamic warm up and conditioning level 5, seat 7 Seated punches 3# 3 x 10 Seated shoulder press 3# 3 x 10  Seated bicep curls 3# 3 x 10 Seated tricep dips 3 x 10  STS with overhead reach 3 x 10 Standing step taps 4 inch step 2 x 20 bilateral   08/04/23 Nustep 6 minutes for dynamic warm up and conditioning level 5, seat 7 Seated punches 3# 3 x 10 Seated shoulder press 3# 3 x 10  Seated small tricep dips 2 x 10 Seated bicep curls 3# 3 x 10 Standing lunges with UE support 2 x 10 bilateral Shoulder abduction 1# 2 x 10   08/04/23 Nustep 6 minutes for dynamic warm up and conditioning level 5, seat 7 Step up with contralateral knee drive 4 inch 2 x 10 bilateral  Anterior step with overhead reach 2 x 10 bilateral Lateral step with overhead reach 2 x 10 bilateral Shoulder horizontal abduction RTB 2 x 10 Bilateral shoulder ER RTB 2 x 10 Lateral stepping at rail 4 x 15 feet bilateral  Shoulder flexion RTB in hands bilateral 2 x 10   08/02/23 Reassessment  Nustep 6 minutes for dynamic  warm up and conditioning level 5, seat 7  07/29/23 Nustep 6 minutes for dynamic warm up and conditioning level 5, seat 7 STS with overhead reach 3 x 10 Alternating step taps 4 inch with contralateral shoulder flexion 2 x 10 bilateral  Shoulder horizontal abduction RTB 2 x 10 Bilateral shoulder ER RTB 2 x 10 Shoulder PNF D2 RTB 2 x 10  Seated small tricep dips 2 x 10  Semi tandem stance x 30sec each  07/19/23 Nustep 6 minutes for dynamic warm up and conditioning level 5, seat 7 LAQ 2.5 # 2 x 10  Bilateral shoulder press 3# 2 x 15 bilateral (2nd set alternating UE) Bilateral bicep curl 3# x 15 Bilateral punch 3# 2 x 15 bilateral Forward hurdles x 3 leading with RLE x 6 (intermittent UE on rail) Lateral step over hurdle x 10 (challenge) Standing marching 2 x 10 Marching on airex pad with SBA/CGA x 15 NBOS on Airex pad x 20sec SBA Semi tandem stance x 30sec each   07/07/23 Nustep 6 minutes for dynamic warm up and conditioning level 5, seat 7 LAQ 2 # 2 x 10  Bilateral shoulder press 3# 2 x 15 bilateral  Bilateral punch 3# 3 x 15 bilateral Standing alternating march 2 x 10 Bridge 2 x 10  Supine shoulder flexion 1# bar  2 x 10 Supine alternating march 2 x 10 bilateral Supine alternating march with opposite UE raise 2 x 10 bilateral   07/05/23 Nustep 6 minutes for dynamic warm up and conditioning level 5, seat 7 Seated row RTB 2 x 10 Bilateral shoulder press 3# 2 x 15 bilateral  Bilateral punch 3# 3 x 15 bilateral Bilateral curl 3 #2 x 15 bilateral  Standing alternating march 2 x 10   06/30/23 Nustep 6 minutes for dynamic warm up and conditioning level 5 STS with overhead reach 3 x 10 Shoulder horizontal abduction RTB 2 x 10 Standing alternating march with contralateral shoulder flexion 2 x 10 bilateral Lateral stepping 4 x 15 feet at rail     PATIENT EDUCATION:  Education details: exercise rationale and technique   Person educated: Patient Education method:  Explanation, Demonstration, Tactile cues, Verbal cues, Education comprehension: verbalized understanding, returned demonstration, verbal cues required, tactile cues required, and needs further education   HOME EXERCISE PROGRAM: Access Code: [email or web address]   URL: [email or web address] Date: 06/28/2023 - Seated March with Resistance  - 1 x daily - 7 x weekly - 3 sets - 15 reps - Seated Hip Abduction with Resistance  - 1 x daily - 7 x weekly - 3 sets - 15 reps - Seated Long Arc Quad  - 1 x daily - 7 x weekly - 3 sets - 15 reps - Shoulder External Rotation and Scapular Retraction with Resistance  - 1 x daily - 7 x weekly - 2 sets - 10 reps - Seated Shoulder Horizontal Abduction with Resistance  - 1 x daily - 7 x weekly - 2 sets - 10 reps  08/11/23 - Seated Punches  - 1 x daily - 7 x weekly - 3 sets - 10 reps - Seated Shoulder Overhead Press with Dumbbells with PLB  - 1 x daily - 7 x weekly - 3 sets - 10 reps - Seated Bicep Curls Supinated with Dumbbells  - 1 x daily - 7 x weekly - 3 sets - 10 reps - Seated Shoulder Abduction with Dumbbells - Thumbs Up  - 1 x daily - 7 x weekly - 3 sets - 10 reps  ASSESSMENT:  CLINICAL IMPRESSION: Patient with improving balance and stride length at beginning of session. Continued with UE and LE strengthening  and coordination with intermittent cueing for mechanics with fair carry over. Patient with impaired mobility toward end of session, had to take booster dose of meds and required more frequent cueing/assist. Patient will continue to benefit from skilled physical therapy in order to improve function and reduce impairment.    OBJECTIVE IMPAIRMENTS Abnormal gait, decreased activity tolerance, decreased balance, decreased mobility, difficulty walking, decreased strength, impaired tone, and pain.   ACTIVITY LIMITATIONS carrying, lifting, bending, standing, squatting, stairs, transfers, bed mobility, dressing, and locomotion level  PARTICIPATION  LIMITATIONS: meal prep, cleaning, laundry, driving, shopping, community activity, and yard work  PERSONAL FACTORS Fibromyalgia; headaches, LBBB; osteopenia  are also affecting patient's functional outcome.   REHAB POTENTIAL: Good  CLINICAL DECISION MAKING: Evolving/moderate complexity increasing pain in her quads   EVALUATION COMPLEXITY: Moderate   GOALS: Goals reviewed with patient? Yes  SHORT TERM GOALS: Target date: 04/18/2023  Patient will transfer sit to stand smoothly and independently  Baseline: Goal status: depends on the day 9/12  2.  Patient will increase gross strength by 5lbs  Baseline:  Goal status: MET  3.  Patient will improve BERG score by 10  Baseline:  Goal status: MET  LONG TERM GOALS: Target date:POC  Patient will go improve balance to CGA for all balance exercises to improve safety  Baseline:  Goal status: INITIAL  2.  Patient will report a 50% reduction in falls  Baseline:  Goal status: INITIAL  3.  Patient will have a full balance program  Baseline:  Goal status: INITIAL 4. Patient will score at least 38/56 on Berg balance scale in order to demonstrate improved balance to reduce the risk for falls at home and in the community.  Baseline:  Goal status: INITIAL   PLAN: PT FREQUENCY: 2x/week  PT DURATION: 8 weeks  PLANNED INTERVENTIONS: Therapeutic exercises, Therapeutic activity, Neuromuscular re-education, Balance training, Gait training, Patient/Family education, Self Care, Joint mobilization, Stair training, Aquatic Therapy, Cryotherapy, Moist heat, Taping, Manual therapy, and Re-evaluation  PLAN FOR NEXT SESSION: work on improving sit to stand, posterior, dynamic, and static balance. Work on general conditioning. Be aware of leg pain on any given day.    Reola Mosher Zanyla Klebba, PT 08/16/2023, 10:56 AM

## 2023-08-18 ENCOUNTER — Ambulatory Visit (HOSPITAL_BASED_OUTPATIENT_CLINIC_OR_DEPARTMENT_OTHER): Payer: Medicare Other | Admitting: Physical Therapy

## 2023-08-18 ENCOUNTER — Encounter (HOSPITAL_BASED_OUTPATIENT_CLINIC_OR_DEPARTMENT_OTHER): Payer: Self-pay | Admitting: Physical Therapy

## 2023-08-18 DIAGNOSIS — G20A1 Parkinson's disease without dyskinesia, without mention of fluctuations: Secondary | ICD-10-CM | POA: Diagnosis not present

## 2023-08-18 DIAGNOSIS — R2689 Other abnormalities of gait and mobility: Secondary | ICD-10-CM | POA: Diagnosis not present

## 2023-08-18 NOTE — Therapy (Signed)
OUTPATIENT PHYSICAL THERAPY LOWER EXTREMITY TREATMENT   Patient Name: Morgan [name] MRN: [id number] DOB:1950/11/16, 72 y.o., female Today's Date: 08/18/2023     PT End of Session - 08/18/23 1152     Visit Number 30    Number of Visits 56    Date for PT Re-Evaluation 09/27/23    Progress Note Due on Visit 36    PT Start Time 1152    PT Stop Time 1230    PT Time Calculation (min) 38 min    Equipment Utilized During Treatment Gait belt    Activity Tolerance Patient tolerated treatment well    Behavior During Therapy WFL for tasks assessed/performed                  Past Medical History:  Diagnosis Date   Anxiety    Fibromyalgia    Headache    Hypercholesteremia    controlled on medication   Hyperlipidemia    Hypertension    no current meds   LBBB (left bundle branch block)    Melanoma (HCC)    MVP (mitral valve prolapse)    Osteopenia    Parkinson disease (HCC)    Pneumonia    SVD (spontaneous vaginal delivery)    x 2   Past Surgical History:  Procedure Laterality Date   BLEPHAROPLASTY     cold knife conization     COLONOSCOPY     FH colon CA   CYSTOSCOPY W/ URETERAL STENT PLACEMENT Left 10/18/2021   Procedure: CYSTOSCOPY WITH RETROGRADE PYELOGRAM, URETERAL STENT PLACEMENT;  Surgeon: Jerilee Field, MD;  Location: [facility];  Service: Urology;  Laterality: Left;   DILATATION & CURETTAGE/HYSTEROSCOPY WITH MYOSURE N/A 02/14/2018   Procedure: ATTEMPTED DILATATION & CURETTAGE/HYSTEROSCOPY WITH MYOSURE;  Surgeon: Gerald Leitz, MD;  Location: [facility];  Service: Gynecology;  Laterality: N/A;  polyp   melanoma cancer     removed from left leg   RECONSTRUCTION OF EYELID     TONSILLECTOMY     TUBAL LIGATION     URETEROSCOPY WITH HOLMIUM LASER LITHOTRIPSY Left 12/17/2021   Procedure: URETEROSCOPY/HOLMIUM LASER/STENT EXCHANGE;  Surgeon: Jerilee Field, MD;  Location: [facility];  Service: Urology;  Laterality: Left;   WISDOM TOOTH EXTRACTION     Patient Active  Problem List   Diagnosis Date Noted   COVID-19 virus infection 05/16/2022   HTN (hypertension) 10/20/2021   Physical deconditioning 10/20/2021   Hydronephrosis with infection 10/18/2021   Bacteremia due to Escherichia coli 10/18/2021   Severe sepsis (HCC) 10/17/2021   UTI (urinary tract infection) 10/17/2021   Acute metabolic encephalopathy 10/17/2021   Parkinson disease (HCC) 10/17/2021   Cervical stenosis (uterine cervix) 02/14/2018   Postmenopausal bleeding 02/14/2018   LBBB (left bundle branch block) 12/25/2015   PVCs (premature ventricular contractions) 12/25/2015   Hyperlipidemia 12/25/2015   Fibromyalgia 01/16/2014   Cholelithiasis 04/25/2013   Paralysis agitans (HCC) 01/15/2013   Hereditary and idiopathic peripheral neuropathy 01/15/2013     [name] MD PCP:   Cindi Carbon PROVIDER: Antoine Primas MD   REFERRING DIAG: Parkinsons   THERAPY DIAG:  Other abnormalities of gait and mobility  Parkinson's disease without dyskinesia or fluctuating manifestations (HCC)  Rationale for Evaluation and Treatment Rehabilitation  ONSET DATE: Several years   SUBJECTIVE:   SUBJECTIVE STATEMENT: Patient states doing well today. Balance and walking have been good in the mornings.   PERTINENT HISTORY: Fibromyalgia; headaches, LBBB; osteopenia   PAIN:  Are you having pain? no: NPRS scale: 0/10 Location: Pain description:  Aggravating factors: just comes and goes  Relieving factors: just comes and goes; sometimes ibuprofen works but she is limited as to how much she can take  PRECAUTIONS: Patient reports that because of her heart she will pass out at times.   WEIGHT BEARING RESTRICTIONS No  FALLS:  Has patient fallen in last 6 months? Yes.  6 falls but likley more. Last fall    LIVING ENVIRONMENT: Stairs to the second floor in her house but she dosen't do much  4 steps in the front  2 steps in the garage  OCCUPATION: retired   Presenter, broadcasting: reading; goes to an  exercise class   PLOF: Independent with household mobility with device  PATIENT GOALS  To have less pain in her legs and to move better    OBJECTIVE:  (measurements taken at evaluation unless otherwise noted)   DIAGNOSTIC FINDINGS:   PATIENT SURVEYS:  FOTO    COGNITION:  Overall cognitive status: Within functional limits for tasks assessed     SENSATION: Denies paresthesias   POSTURE: No Significant postural limitations  PALPATION: No trigger points noted   LOWER EXTREMITY ROM:  Passive ROM Right eval Left eval  Hip flexion    Hip extension    Hip abduction    Hip adduction    Hip internal rotation    Hip external rotation    Knee flexion    Knee extension    Ankle dorsiflexion    Ankle plantarflexion    Ankle inversion    Ankle eversion     (Blank rows = not tested)  WNL  LOWER EXTREMITY MMT:  MMT Right eval Left eval Right  7/22 Left  7/2 Right  9/12 Left 9/12 Right 06/13/23 Left 06/13/23 Right 08/02/23 Left 08/02/23  Hip flexion 21.7 29.7 23.5 25.9 24.6 25.6 41.3 37.8 47 45.5  Hip extension            Hip abduction 27.6 18.5 19.1 8.7 21.2 14.4 29.9 29.5 35.5 35.3  Hip adduction            Hip internal rotation            Hip external rotation            Knee flexion            Knee extension 23.5 20.2 17.7 16.8 22.7 16.3 39.4 49.8 48.8 53.5  Ankle dorsiflexion            Ankle plantarflexion            Ankle inversion            Ankle eversion             (Blank rows = not tested)  GAIT: Decreased hip flexion; leans to the left; right hip frop. Patient required CGA while walking and min a when she turned for balance. No change from IE   Functional tests:  Sit to stand: cuing to catch balance. Uses momentum to stand.    5x sit to stand Balance: 13 sec   9/12 5x sit to stand 13 sec Narrow base min a  Narrow base eyes closed min to mod a  Tandem stance Min a both ways.  Single leg stance 5 sec bilateral     06/13/23: 285 feet  with walking stick, CGA  for safety  Reassessment 08/02/23:  258 feet with walking stick, CGA  for safety 5xSTS: 13.5 seconds with poor eccentric control  BERG Balance Test  Date:      Sit to Stand 1 2 06/13/23: 2 08/02/23: 3  Standing unsupported 3 3 3 3   Sitting with back unsupported but feet supported 1 3 4 4   Stand to sit  1 2 3 3   Transfers  2 1 2 2   Standing unsupported with eyes closed 3 3 3 3   Standing unsupported feet together 3 3 3 3   From standing position, reach forward with outstretched arm 1 2 3 3   From standing position, pick up object from floor 1 2 3 3   From standing position, turn and look behind over each shoulder 1 2 2 3   Turn 360 2 1 2 2   Standing unsupported, alternately place foot on step 1 1 2 2   Standing unsupported, one foot in front 1 1 2 2   Standing on one leg 0 0 2 1  Total:  21/56 26/56 34/56  33/56      TODAY'S TREATMENT: 08/18/23 Nustep 6 minutes for dynamic warm up and conditioning level 5, seat 7 Seated punches 4# 3 x 10 Seated shoulder press 4# 3 x 10  Seated bicep curls 4# 3 x 10 Seated tricep dips 3 x 10  Anterior step with overhead reach 2 x 10 bilateral Lateral step with opening reach 2 x 10 bilateral NBOS on foam with anterior and lateral reaches 2 x 10   08/16/23 Nustep 6 minutes for dynamic warm up and conditioning level 5, seat 7 Seated punches 3# 3 x 10 Seated shoulder press 3# 3 x 10  Seated bicep curls 3# 3 x 10 Seated tricep dips 3 x 10  STS with overhead reach 3 x 10 Standing step taps 4 inch step 2 x 20 bilateral   08/04/23 Nustep 6 minutes for dynamic warm up and conditioning level 5, seat 7 Seated punches 3# 3 x 10 Seated shoulder press 3# 3 x 10  Seated small tricep dips 2 x 10 Seated bicep curls 3# 3 x 10 Standing lunges with UE support 2 x 10 bilateral Shoulder abduction 1# 2 x 10   08/04/23 Nustep 6 minutes for dynamic warm up and conditioning level 5, seat 7 Step up with contralateral knee drive 4  inch 2 x 10 bilateral  Anterior step with overhead reach 2 x 10 bilateral Lateral step with overhead reach 2 x 10 bilateral Shoulder horizontal abduction RTB 2 x 10 Bilateral shoulder ER RTB 2 x 10 Lateral stepping at rail 4 x 15 feet bilateral  Shoulder flexion RTB in hands bilateral 2 x 10   08/02/23 Reassessment  Nustep 6 minutes for dynamic warm up and conditioning level 5, seat 7  07/29/23 Nustep 6 minutes for dynamic warm up and conditioning level 5, seat 7 STS with overhead reach 3 x 10 Alternating step taps 4 inch with contralateral shoulder flexion 2 x 10 bilateral  Shoulder horizontal abduction RTB 2 x 10 Bilateral shoulder ER RTB 2 x 10 Shoulder PNF D2 RTB 2 x 10  Seated small tricep dips 2 x 10  Semi tandem stance x 30sec each  07/19/23 Nustep 6 minutes for dynamic warm up and conditioning level 5, seat 7 LAQ 2.5 # 2 x 10  Bilateral shoulder press 3# 2 x 15 bilateral (2nd set alternating UE) Bilateral bicep curl 3# x 15 Bilateral punch 3# 2 x 15 bilateral Forward hurdles x 3 leading with RLE x 6 (intermittent UE on rail) Lateral step over hurdle x 10 (challenge) Standing marching 2 x 10 Marching on airex  pad with SBA/CGA x 15 NBOS on Airex pad x 20sec SBA Semi tandem stance x 30sec each   07/07/23 Nustep 6 minutes for dynamic warm up and conditioning level 5, seat 7 LAQ 2 # 2 x 10  Bilateral shoulder press 3# 2 x 15 bilateral  Bilateral punch 3# 3 x 15 bilateral Standing alternating march 2 x 10 Bridge 2 x 10  Supine shoulder flexion 1# bar 2 x 10 Supine alternating march 2 x 10 bilateral Supine alternating march with opposite UE raise 2 x 10 bilateral   07/05/23 Nustep 6 minutes for dynamic warm up and conditioning level 5, seat 7 Seated row RTB 2 x 10 Bilateral shoulder press 3# 2 x 15 bilateral  Bilateral punch 3# 3 x 15 bilateral Bilateral curl 3 #2 x 15 bilateral  Standing alternating march 2 x 10   06/30/23 Nustep 6 minutes for dynamic warm up  and conditioning level 5 STS with overhead reach 3 x 10 Shoulder horizontal abduction RTB 2 x 10 Standing alternating march with contralateral shoulder flexion 2 x 10 bilateral Lateral stepping 4 x 15 feet at rail     PATIENT EDUCATION:  Education details: exercise rationale and technique   Person educated: Patient Education method: Explanation, Demonstration, Tactile cues, Verbal cues, Education comprehension: verbalized understanding, returned demonstration, verbal cues required, tactile cues required, and needs further education   HOME EXERCISE PROGRAM: Access Code: [email or web address]   URL: [email or web address] Date: 06/28/2023 - Seated March with Resistance  - 1 x daily - 7 x weekly - 3 sets - 15 reps - Seated Hip Abduction with Resistance  - 1 x daily - 7 x weekly - 3 sets - 15 reps - Seated Long Arc Quad  - 1 x daily - 7 x weekly - 3 sets - 15 reps - Shoulder External Rotation and Scapular Retraction with Resistance  - 1 x daily - 7 x weekly - 2 sets - 10 reps - Seated Shoulder Horizontal Abduction with Resistance  - 1 x daily - 7 x weekly - 2 sets - 10 reps  08/11/23 - Seated Punches  - 1 x daily - 7 x weekly - 3 sets - 10 reps - Seated Shoulder Overhead Press with Dumbbells with PLB  - 1 x daily - 7 x weekly - 3 sets - 10 reps - Seated Bicep Curls Supinated with Dumbbells  - 1 x daily - 7 x weekly - 3 sets - 10 reps - Seated Shoulder Abduction with Dumbbells - Thumbs Up  - 1 x daily - 7 x weekly - 3 sets - 10 reps  ASSESSMENT:  CLINICAL IMPRESSION: Able to progress resistance with weights performed previously. Frequent cueing required for mechanics of all exercises and assist for balance with standing exercises. Patient will continue to benefit from skilled physical therapy in order to improve function and reduce impairment.    OBJECTIVE IMPAIRMENTS Abnormal gait, decreased activity tolerance, decreased balance, decreased mobility, difficulty walking, decreased  strength, impaired tone, and pain.   ACTIVITY LIMITATIONS carrying, lifting, bending, standing, squatting, stairs, transfers, bed mobility, dressing, and locomotion level  PARTICIPATION LIMITATIONS: meal prep, cleaning, laundry, driving, shopping, community activity, and yard work  PERSONAL FACTORS Fibromyalgia; headaches, LBBB; osteopenia  are also affecting patient's functional outcome.   REHAB POTENTIAL: Good  CLINICAL DECISION MAKING: Evolving/moderate complexity increasing pain in her quads   EVALUATION COMPLEXITY: Moderate   GOALS: Goals reviewed with patient? Yes  SHORT TERM GOALS: Target date: 04/18/2023  Patient will transfer  sit to stand smoothly and independently  Baseline: Goal status: depends on the day 9/12  2.  Patient will increase gross strength by 5lbs  Baseline:  Goal status: MET  3.  Patient will improve BERG score by 10  Baseline:  Goal status: MET LONG TERM GOALS: Target date:POC  Patient will go improve balance to CGA for all balance exercises to improve safety  Baseline:  Goal status: INITIAL  2.  Patient will report a 50% reduction in falls  Baseline:  Goal status: INITIAL  3.  Patient will have a full balance program  Baseline:  Goal status: INITIAL 4. Patient will score at least 38/56 on Berg balance scale in order to demonstrate improved balance to reduce the risk for falls at home and in the community.  Baseline:  Goal status: INITIAL   PLAN: PT FREQUENCY: 2x/week  PT DURATION: 8 weeks  PLANNED INTERVENTIONS: Therapeutic exercises, Therapeutic activity, Neuromuscular re-education, Balance training, Gait training, Patient/Family education, Self Care, Joint mobilization, Stair training, Aquatic Therapy, Cryotherapy, Moist heat, Taping, Manual therapy, and Re-evaluation  PLAN FOR NEXT SESSION: work on improving sit to stand, posterior, dynamic, and static balance. Work on general conditioning. Be aware of leg pain on any given day.     Reola Mosher Barnes Florek, PT 08/18/2023, 11:53 AM

## 2023-08-21 ENCOUNTER — Encounter (HOSPITAL_BASED_OUTPATIENT_CLINIC_OR_DEPARTMENT_OTHER): Payer: Self-pay | Admitting: Physical Therapy

## 2023-08-21 ENCOUNTER — Ambulatory Visit (HOSPITAL_BASED_OUTPATIENT_CLINIC_OR_DEPARTMENT_OTHER): Payer: Medicare Other | Admitting: Physical Therapy

## 2023-08-21 DIAGNOSIS — G20A1 Parkinson's disease without dyskinesia, without mention of fluctuations: Secondary | ICD-10-CM

## 2023-08-21 DIAGNOSIS — R2689 Other abnormalities of gait and mobility: Secondary | ICD-10-CM | POA: Diagnosis not present

## 2023-08-21 NOTE — Therapy (Signed)
OUTPATIENT PHYSICAL THERAPY LOWER EXTREMITY TREATMENT   Patient Name: Morgan [name] MRN: [id number] DOB:04/06/1951, 72 y.o., female Today's Date: 08/21/2023     PT End of Session - 08/21/23 1128     Visit Number 31    Number of Visits 56    Date for PT Re-Evaluation 09/27/23    Authorization Type Progress note done at visit 26 next at 36    PT Start Time 1015    PT Stop Time 1057    PT Time Calculation (min) 42 min    Activity Tolerance Patient tolerated treatment well    Behavior During Therapy WFL for tasks assessed/performed                   Past Medical History:  Diagnosis Date   Anxiety    Fibromyalgia    Headache    Hypercholesteremia    controlled on medication   Hyperlipidemia    Hypertension    no current meds   LBBB (left bundle branch block)    Melanoma (HCC)    MVP (mitral valve prolapse)    Osteopenia    Parkinson disease (HCC)    Pneumonia    SVD (spontaneous vaginal delivery)    x 2   Past Surgical History:  Procedure Laterality Date   BLEPHAROPLASTY     cold knife conization     COLONOSCOPY     FH colon CA   CYSTOSCOPY W/ URETERAL STENT PLACEMENT Left 10/18/2021   Procedure: CYSTOSCOPY WITH RETROGRADE PYELOGRAM, URETERAL STENT PLACEMENT;  Surgeon: Jerilee Field, MD;  Location: [facility];  Service: Urology;  Laterality: Left;   DILATATION & CURETTAGE/HYSTEROSCOPY WITH MYOSURE N/A 02/14/2018   Procedure: ATTEMPTED DILATATION & CURETTAGE/HYSTEROSCOPY WITH MYOSURE;  Surgeon: Gerald Leitz, MD;  Location: [facility];  Service: Gynecology;  Laterality: N/A;  polyp   melanoma cancer     removed from left leg   RECONSTRUCTION OF EYELID     TONSILLECTOMY     TUBAL LIGATION     URETEROSCOPY WITH HOLMIUM LASER LITHOTRIPSY Left 12/17/2021   Procedure: URETEROSCOPY/HOLMIUM LASER/STENT EXCHANGE;  Surgeon: Jerilee Field, MD;  Location: [facility];  Service: Urology;  Laterality: Left;   WISDOM TOOTH EXTRACTION     Patient Active Problem List    Diagnosis Date Noted   COVID-19 virus infection 05/16/2022   HTN (hypertension) 10/20/2021   Physical deconditioning 10/20/2021   Hydronephrosis with infection 10/18/2021   Bacteremia due to Escherichia coli 10/18/2021   Severe sepsis (HCC) 10/17/2021   UTI (urinary tract infection) 10/17/2021   Acute metabolic encephalopathy 10/17/2021   Parkinson disease (HCC) 10/17/2021   Cervical stenosis (uterine cervix) 02/14/2018   Postmenopausal bleeding 02/14/2018   LBBB (left bundle branch block) 12/25/2015   PVCs (premature ventricular contractions) 12/25/2015   Hyperlipidemia 12/25/2015   Fibromyalgia 01/16/2014   Cholelithiasis 04/25/2013   Paralysis agitans (HCC) 01/15/2013   Hereditary and idiopathic peripheral neuropathy 01/15/2013     [name] MD PCP:   Cindi Carbon PROVIDER: Antoine Primas MD   REFERRING DIAG: Parkinsons   THERAPY DIAG:  Other abnormalities of gait and mobility  Parkinson's disease without dyskinesia or fluctuating manifestations (HCC)  Rationale for Evaluation and Treatment Rehabilitation  ONSET DATE: Several years   SUBJECTIVE:   SUBJECTIVE STATEMENT: Patient states doing well today. Balance and walking have been good in the mornings.   PERTINENT HISTORY: Fibromyalgia; headaches, LBBB; osteopenia   PAIN:  Are you having pain? no: NPRS scale: 0/10 Location: Pain description:  Aggravating factors: just comes and goes  Relieving factors: just comes and goes; sometimes ibuprofen works but she is limited as to how much she can take  PRECAUTIONS: Patient reports that because of her heart she will pass out at times.   WEIGHT BEARING RESTRICTIONS No  FALLS:  Has patient fallen in last 6 months? Yes.  6 falls but likley more. Last fall    LIVING ENVIRONMENT: Stairs to the second floor in her house but she dosen't do much  4 steps in the front  2 steps in the garage  OCCUPATION: retired   Presenter, broadcasting: reading; goes to an exercise class    PLOF: Independent with household mobility with device  PATIENT GOALS  To have less pain in her legs and to move better    OBJECTIVE:  (measurements taken at evaluation unless otherwise noted)   DIAGNOSTIC FINDINGS:   PATIENT SURVEYS:  FOTO    COGNITION:  Overall cognitive status: Within functional limits for tasks assessed     SENSATION: Denies paresthesias   POSTURE: No Significant postural limitations  PALPATION: No trigger points noted   LOWER EXTREMITY ROM:  Passive ROM Right eval Left eval  Hip flexion    Hip extension    Hip abduction    Hip adduction    Hip internal rotation    Hip external rotation    Knee flexion    Knee extension    Ankle dorsiflexion    Ankle plantarflexion    Ankle inversion    Ankle eversion     (Blank rows = not tested)  WNL  LOWER EXTREMITY MMT:  MMT Right eval Left eval Right  7/22 Left  7/2 Right  9/12 Left 9/12 Right 06/13/23 Left 06/13/23 Right 08/02/23 Left 08/02/23  Hip flexion 21.7 29.7 23.5 25.9 24.6 25.6 41.3 37.8 47 45.5  Hip extension            Hip abduction 27.6 18.5 19.1 8.7 21.2 14.4 29.9 29.5 35.5 35.3  Hip adduction            Hip internal rotation            Hip external rotation            Knee flexion            Knee extension 23.5 20.2 17.7 16.8 22.7 16.3 39.4 49.8 48.8 53.5  Ankle dorsiflexion            Ankle plantarflexion            Ankle inversion            Ankle eversion             (Blank rows = not tested)  GAIT: Decreased hip flexion; leans to the left; right hip frop. Patient required CGA while walking and min a when she turned for balance. No change from IE   Functional tests:  Sit to stand: cuing to catch balance. Uses momentum to stand.    5x sit to stand Balance: 13 sec   9/12 5x sit to stand 13 sec Narrow base min a  Narrow base eyes closed min to mod a  Tandem stance Min a both ways.  Single leg stance 5 sec bilateral     06/13/23: 285 feet with walking  stick, CGA  for safety  Reassessment 08/02/23:  258 feet with walking stick, CGA  for safety 5xSTS: 13.5 seconds with poor eccentric control  BERG Balance Test  Date:      Sit to Stand 1 2 06/13/23: 2 08/02/23: 3  Standing unsupported 3 3 3 3   Sitting with back unsupported but feet supported 1 3 4 4   Stand to sit  1 2 3 3   Transfers  2 1 2 2   Standing unsupported with eyes closed 3 3 3 3   Standing unsupported feet together 3 3 3 3   From standing position, reach forward with outstretched arm 1 2 3 3   From standing position, pick up object from floor 1 2 3 3   From standing position, turn and look behind over each shoulder 1 2 2 3   Turn 360 2 1 2 2   Standing unsupported, alternately place foot on step 1 1 2 2   Standing unsupported, one foot in front 1 1 2 2   Standing on one leg 0 0 2 1  Total:  21/56 26/56 34/56  33/56      TODAY'S TREATMENT: 12/23  Nustep 6 minutes for dynamic warm up and conditioning level 5, seat 7 Punches 3 lbs 3x10  Seated punches 4# 3 x 10 Seated shoulder press 4# 3 x 10  Seated bicep curls 4# 3 x 10  LAQ 3x10 red  March 3x10 red   Narrow base of support air-ex EO and EC 2x30  Heel toe on air-ex x30  Stnaidng march 2x10 on airpex    08/18/23 Nustep 6 minutes for dynamic warm up and conditioning level 5, seat 7 Seated punches 4# 3 x 10 Seated shoulder press 4# 3 x 10  Seated bicep curls 4# 3 x 10 Seated tricep dips 3 x 10  Anterior step with overhead reach 2 x 10 bilateral Lateral step with opening reach 2 x 10 bilateral NBOS on foam with anterior and lateral reaches 2 x 10   08/16/23 Nustep 6 minutes for dynamic warm up and conditioning level 5, seat 7 Seated punches 3# 3 x 10 Seated shoulder press 3# 3 x 10  Seated bicep curls 3# 3 x 10 Seated tricep dips 3 x 10  STS with overhead reach 3 x 10 Standing step taps 4 inch step 2 x 20 bilateral   08/04/23 Nustep 6 minutes for dynamic warm up and conditioning level 5, seat  7 Seated punches 3# 3 x 10 Seated shoulder press 3# 3 x 10  Seated small tricep dips 2 x 10 Seated bicep curls 3# 3 x 10 Standing lunges with UE support 2 x 10 bilateral Shoulder abduction 1# 2 x 10   08/04/23 Nustep 6 minutes for dynamic warm up and conditioning level 5, seat 7 Step up with contralateral knee drive 4 inch 2 x 10 bilateral  Anterior step with overhead reach 2 x 10 bilateral Lateral step with overhead reach 2 x 10 bilateral Shoulder horizontal abduction RTB 2 x 10 Bilateral shoulder ER RTB 2 x 10 Lateral stepping at rail 4 x 15 feet bilateral  Shoulder flexion RTB in hands bilateral 2 x 10   08/02/23 Reassessment  Nustep 6 minutes for dynamic warm up and conditioning level 5, seat 7  07/29/23 Nustep 6 minutes for dynamic warm up and conditioning level 5, seat 7 STS with overhead reach 3 x 10 Alternating step taps 4 inch with contralateral shoulder flexion 2 x 10 bilateral  Shoulder horizontal abduction RTB 2 x 10 Bilateral shoulder ER RTB 2 x 10 Shoulder PNF D2 RTB 2 x 10  Seated small tricep dips 2 x 10  Semi tandem stance x 30sec each  07/19/23 Nustep 6 minutes for dynamic warm up and conditioning level 5, seat 7 LAQ 2.5 # 2 x 10  Bilateral shoulder press 3# 2 x 15 bilateral (2nd set alternating UE) Bilateral bicep curl 3# x 15 Bilateral punch 3# 2 x 15 bilateral Forward hurdles x 3 leading with RLE x 6 (intermittent UE on rail) Lateral step over hurdle x 10 (challenge) Standing marching 2 x 10 Marching on airex pad with SBA/CGA x 15 NBOS on Airex pad x 20sec SBA Semi tandem stance x 30sec each   07/07/23 Nustep 6 minutes for dynamic warm up and conditioning level 5, seat 7 LAQ 2 # 2 x 10  Bilateral shoulder press 3# 2 x 15 bilateral  Bilateral punch 3# 3 x 15 bilateral Standing alternating march 2 x 10 Bridge 2 x 10  Supine shoulder flexion 1# bar 2 x 10 Supine alternating march 2 x 10 bilateral Supine alternating march with opposite UE raise 2  x 10 bilateral   07/05/23 Nustep 6 minutes for dynamic warm up and conditioning level 5, seat 7 Seated row RTB 2 x 10 Bilateral shoulder press 3# 2 x 15 bilateral  Bilateral punch 3# 3 x 15 bilateral Bilateral curl 3 #2 x 15 bilateral  Standing alternating march 2 x 10   06/30/23 Nustep 6 minutes for dynamic warm up and conditioning level 5 STS with overhead reach 3 x 10 Shoulder horizontal abduction RTB 2 x 10 Standing alternating march with contralateral shoulder flexion 2 x 10 bilateral Lateral stepping 4 x 15 feet at rail     PATIENT EDUCATION:  Education details: exercise rationale and technique   Person educated: Patient Education method: Explanation, Demonstration, Tactile cues, Verbal cues, Education comprehension: verbalized understanding, returned demonstration, verbal cues required, tactile cues required, and needs further education   HOME EXERCISE PROGRAM: Access Code: [email or web address]   URL: [email or web address] Date: 06/28/2023 - Seated March with Resistance  - 1 x daily - 7 x weekly - 3 sets - 15 reps - Seated Hip Abduction with Resistance  - 1 x daily - 7 x weekly - 3 sets - 15 reps - Seated Long Arc Quad  - 1 x daily - 7 x weekly - 3 sets - 15 reps - Shoulder External Rotation and Scapular Retraction with Resistance  - 1 x daily - 7 x weekly - 2 sets - 10 reps - Seated Shoulder Horizontal Abduction with Resistance  - 1 x daily - 7 x weekly - 2 sets - 10 reps  08/11/23 - Seated Punches  - 1 x daily - 7 x weekly - 3 sets - 10 reps - Seated Shoulder Overhead Press with Dumbbells with PLB  - 1 x daily - 7 x weekly - 3 sets - 10 reps - Seated Bicep Curls Supinated with Dumbbells  - 1 x daily - 7 x weekly - 3 sets - 10 reps - Seated Shoulder Abduction with Dumbbells - Thumbs Up  - 1 x daily - 7 x weekly - 3 sets - 10 reps  ASSESSMENT:  CLINICAL IMPRESSION: Th patient did well with her balance exercises today. She also tolerated her UE and LE exercises  well. She would benefit from continued skilled therapy to progress to gym equipment and continue to challenge her balance.  OBJECTIVE IMPAIRMENTS Abnormal gait, decreased activity tolerance, decreased balance, decreased mobility, difficulty walking, decreased strength, impaired tone, and pain.   ACTIVITY LIMITATIONS carrying, lifting, bending, standing, squatting, stairs, transfers, bed mobility, dressing, and locomotion level  PARTICIPATION LIMITATIONS: meal prep, cleaning, laundry, driving, shopping, community activity, and yard work  PERSONAL FACTORS Fibromyalgia; headaches, LBBB; osteopenia  are also affecting patient's functional outcome.   REHAB POTENTIAL: Good  CLINICAL DECISION MAKING: Evolving/moderate complexity increasing pain in her quads   EVALUATION COMPLEXITY: Moderate   GOALS: Goals reviewed with patient? Yes  SHORT TERM GOALS: Target date: 04/18/2023  Patient will transfer sit to stand smoothly and independently  Baseline: Goal status: depends on the day 9/12  2.  Patient will increase gross strength by 5lbs  Baseline:  Goal status: MET  3.  Patient will improve BERG score by 10  Baseline:  Goal status: MET LONG TERM GOALS: Target date:POC  Patient will go improve balance to CGA for all balance exercises to improve safety  Baseline:  Goal status: INITIAL  2.  Patient will report a 50% reduction in falls  Baseline:  Goal status: INITIAL  3.  Patient will have a full balance program  Baseline:  Goal status: INITIAL 4. Patient will score at least 38/56 on Berg balance scale in order to demonstrate improved balance to reduce the risk for falls at home and in the community.  Baseline:  Goal status: INITIAL   PLAN: PT FREQUENCY: 2x/week  PT DURATION: 8 weeks  PLANNED INTERVENTIONS: Therapeutic exercises, Therapeutic activity, Neuromuscular re-education, Balance training, Gait training, Patient/Family education, Self Care, Joint mobilization, Stair  training, Aquatic Therapy, Cryotherapy, Moist heat, Taping, Manual therapy, and Re-evaluation  PLAN FOR NEXT SESSION: work on improving sit to stand, posterior, dynamic, and static balance. Work on general conditioning. Be aware of leg pain on any given day.    Dessie Coma, PT 08/21/2023, 11:32 AM

## 2023-08-24 ENCOUNTER — Ambulatory Visit (HOSPITAL_BASED_OUTPATIENT_CLINIC_OR_DEPARTMENT_OTHER): Payer: Medicare Other | Admitting: Physical Therapy

## 2023-08-24 DIAGNOSIS — G20A1 Parkinson's disease without dyskinesia, without mention of fluctuations: Secondary | ICD-10-CM | POA: Diagnosis not present

## 2023-08-24 DIAGNOSIS — R2689 Other abnormalities of gait and mobility: Secondary | ICD-10-CM

## 2023-08-24 NOTE — Therapy (Signed)
OUTPATIENT PHYSICAL THERAPY LOWER EXTREMITY TREATMENT   Patient Name: Morgan [name] MRN: [id number] DOB:December 09, 1950, 71 y.o., female Today's Date: 08/24/2023     PT End of Session - 08/24/23 1054     Visit Number 32    Number of Visits 56    Date for PT Re-Evaluation 09/27/23    PT Start Time 1015    PT Stop Time 1050   session ened eatly 2nd to fatigue and leg pain   PT Time Calculation (min) 35 min    Activity Tolerance Patient tolerated treatment well    Behavior During Therapy WFL for tasks assessed/performed                    Past Medical History:  Diagnosis Date   Anxiety    Fibromyalgia    Headache    Hypercholesteremia    controlled on medication   Hyperlipidemia    Hypertension    no current meds   LBBB (left bundle branch block)    Melanoma (HCC)    MVP (mitral valve prolapse)    Osteopenia    Parkinson disease (HCC)    Pneumonia    SVD (spontaneous vaginal delivery)    x 2   Past Surgical History:  Procedure Laterality Date   BLEPHAROPLASTY     cold knife conization     COLONOSCOPY     FH colon CA   CYSTOSCOPY W/ URETERAL STENT PLACEMENT Left 10/18/2021   Procedure: CYSTOSCOPY WITH RETROGRADE PYELOGRAM, URETERAL STENT PLACEMENT;  Surgeon: Jerilee Field, MD;  Location: [facility];  Service: Urology;  Laterality: Left;   DILATATION & CURETTAGE/HYSTEROSCOPY WITH MYOSURE N/A 02/14/2018   Procedure: ATTEMPTED DILATATION & CURETTAGE/HYSTEROSCOPY WITH MYOSURE;  Surgeon: Gerald Leitz, MD;  Location: [facility];  Service: Gynecology;  Laterality: N/A;  polyp   melanoma cancer     removed from left leg   RECONSTRUCTION OF EYELID     TONSILLECTOMY     TUBAL LIGATION     URETEROSCOPY WITH HOLMIUM LASER LITHOTRIPSY Left 12/17/2021   Procedure: URETEROSCOPY/HOLMIUM LASER/STENT EXCHANGE;  Surgeon: Jerilee Field, MD;  Location: [facility];  Service: Urology;  Laterality: Left;   WISDOM TOOTH EXTRACTION     Patient Active Problem List   Diagnosis Date  Noted   COVID-19 virus infection 05/16/2022   HTN (hypertension) 10/20/2021   Physical deconditioning 10/20/2021   Hydronephrosis with infection 10/18/2021   Bacteremia due to Escherichia coli 10/18/2021   Severe sepsis (HCC) 10/17/2021   UTI (urinary tract infection) 10/17/2021   Acute metabolic encephalopathy 10/17/2021   Parkinson disease (HCC) 10/17/2021   Cervical stenosis (uterine cervix) 02/14/2018   Postmenopausal bleeding 02/14/2018   LBBB (left bundle branch block) 12/25/2015   PVCs (premature ventricular contractions) 12/25/2015   Hyperlipidemia 12/25/2015   Fibromyalgia 01/16/2014   Cholelithiasis 04/25/2013   Paralysis agitans (HCC) 01/15/2013   Hereditary and idiopathic peripheral neuropathy 01/15/2013     [name] MD PCP:   Cindi Carbon PROVIDER: Antoine Primas MD   REFERRING DIAG: Parkinsons   THERAPY DIAG:  Other abnormalities of gait and mobility  Parkinson's disease without dyskinesia or fluctuating manifestations (HCC)  Rationale for Evaluation and Treatment Rehabilitation  ONSET DATE: Several years   SUBJECTIVE:   SUBJECTIVE STATEMENT: The patient reports she is tired today. She I having some difficulty staying awake. Her legs feel so-so.   PERTINENT HISTORY: Fibromyalgia; headaches, LBBB; osteopenia   PAIN:  Are you having pain? no: NPRS scale: 0/10 Location: Pain description:  Aggravating factors: just comes and goes  Relieving factors: just comes and goes; sometimes ibuprofen works but she is limited as to how much she can take  PRECAUTIONS: Patient reports that because of her heart she will pass out at times.   WEIGHT BEARING RESTRICTIONS No  FALLS:  Has patient fallen in last 6 months? Yes.  6 falls but likley more. Last fall    LIVING ENVIRONMENT: Stairs to the second floor in her house but she dosen't do much  4 steps in the front  2 steps in the garage  OCCUPATION: retired   Presenter, broadcasting: reading; goes to an exercise class    PLOF: Independent with household mobility with device  PATIENT GOALS  To have less pain in her legs and to move better    OBJECTIVE:  (measurements taken at evaluation unless otherwise noted)   DIAGNOSTIC FINDINGS:   PATIENT SURVEYS:  FOTO    COGNITION:  Overall cognitive status: Within functional limits for tasks assessed     SENSATION: Denies paresthesias   POSTURE: No Significant postural limitations  PALPATION: No trigger points noted   LOWER EXTREMITY ROM:  Passive ROM Right eval Left eval  Hip flexion    Hip extension    Hip abduction    Hip adduction    Hip internal rotation    Hip external rotation    Knee flexion    Knee extension    Ankle dorsiflexion    Ankle plantarflexion    Ankle inversion    Ankle eversion     (Blank rows = not tested)  WNL  LOWER EXTREMITY MMT:  MMT Right eval Left eval Right  7/22 Left  7/2 Right  9/12 Left 9/12 Right 06/13/23 Left 06/13/23 Right 08/02/23 Left 08/02/23  Hip flexion 21.7 29.7 23.5 25.9 24.6 25.6 41.3 37.8 47 45.5  Hip extension            Hip abduction 27.6 18.5 19.1 8.7 21.2 14.4 29.9 29.5 35.5 35.3  Hip adduction            Hip internal rotation            Hip external rotation            Knee flexion            Knee extension 23.5 20.2 17.7 16.8 22.7 16.3 39.4 49.8 48.8 53.5  Ankle dorsiflexion            Ankle plantarflexion            Ankle inversion            Ankle eversion             (Blank rows = not tested)  GAIT: Decreased hip flexion; leans to the left; right hip frop. Patient required CGA while walking and min a when she turned for balance. No change from IE   Functional tests:  Sit to stand: cuing to catch balance. Uses momentum to stand.    5x sit to stand Balance: 13 sec   9/12 5x sit to stand 13 sec Narrow base min a  Narrow base eyes closed min to mod a  Tandem stance Min a both ways.  Single leg stance 5 sec bilateral     06/13/23: 285 feet with walking  stick, CGA  for safety  Reassessment 08/02/23:  258 feet with walking stick, CGA  for safety 5xSTS: 13.5 seconds with poor eccentric control  BERG Balance Test  Date:      Sit to Stand 1 2 06/13/23: 2 08/02/23: 3  Standing unsupported 3 3 3 3   Sitting with back unsupported but feet supported 1 3 4 4   Stand to sit  1 2 3 3   Transfers  2 1 2 2   Standing unsupported with eyes closed 3 3 3 3   Standing unsupported feet together 3 3 3 3   From standing position, reach forward with outstretched arm 1 2 3 3   From standing position, pick up object from floor 1 2 3 3   From standing position, turn and look behind over each shoulder 1 2 2 3   Turn 360 2 1 2 2   Standing unsupported, alternately place foot on step 1 1 2 2   Standing unsupported, one foot in front 1 1 2 2   Standing on one leg 0 0 2 1  Total:  21/56 26/56 34/56  33/56      TODAY'S TREATMENT: 12/26 Nustep 6 minutes for dynamic warm up and conditioning level 5, seat 7 Punches 3 lbs 3x10  Seated punches 4# 3 x 10 Seated shoulder press 4# 3 x 10  Seated bicep curls 4# 3 x 10  Air-ex 2x30 seconds EO. Had to go to the bathroom after second set.   Gait: walked into the gym with CGA 30'x2  Walked to the bathroom 75'x2      12/23  Nustep 6 minutes for dynamic warm up and conditioning level 5, seat 7 Punches 3 lbs 3x10  Seated punches 4# 3 x 10 Seated shoulder press 4# 3 x 10  Seated bicep curls 4# 3 x 10  LAQ 3x10 red  March 3x10 red   Narrow base of support air-ex EO and EC 2x30  Heel toe on air-ex x30  Stnaidng march 2x10 on airpex    08/18/23 Nustep 6 minutes for dynamic warm up and conditioning level 5, seat 7 Seated punches 4# 3 x 10 Seated shoulder press 4# 3 x 10  Seated bicep curls 4# 3 x 10 Seated tricep dips 3 x 10  Anterior step with overhead reach 2 x 10 bilateral Lateral step with opening reach 2 x 10 bilateral NBOS on foam with anterior and lateral reaches 2 x 10   08/16/23 Nustep 6  minutes for dynamic warm up and conditioning level 5, seat 7 Seated punches 3# 3 x 10 Seated shoulder press 3# 3 x 10  Seated bicep curls 3# 3 x 10 Seated tricep dips 3 x 10  STS with overhead reach 3 x 10 Standing step taps 4 inch step 2 x 20 bilateral   08/04/23 Nustep 6 minutes for dynamic warm up and conditioning level 5, seat 7 Seated punches 3# 3 x 10 Seated shoulder press 3# 3 x 10  Seated small tricep dips 2 x 10 Seated bicep curls 3# 3 x 10 Standing lunges with UE support 2 x 10 bilateral Shoulder abduction 1# 2 x 10   08/04/23 Nustep 6 minutes for dynamic warm up and conditioning level 5, seat 7 Step up with contralateral knee drive 4 inch 2 x 10 bilateral  Anterior step with overhead reach 2 x 10 bilateral Lateral step with overhead reach 2 x 10 bilateral Shoulder horizontal abduction RTB 2 x 10 Bilateral shoulder ER RTB 2 x 10 Lateral stepping at rail 4 x 15 feet bilateral  Shoulder flexion RTB in hands bilateral 2 x 10   08/02/23 Reassessment  Nustep 6 minutes for dynamic warm up and conditioning level  5, seat 7      PATIENT EDUCATION:  Education details: exercise rationale and technique   Person educated: Patient Education method: Explanation, Demonstration, Tactile cues, Verbal cues, Education comprehension: verbalized understanding, returned demonstration, verbal cues required, tactile cues required, and needs further education   HOME EXERCISE PROGRAM: Access Code: [email or web address]   URL: [email or web address] Date: 06/28/2023 - Seated March with Resistance  - 1 x daily - 7 x weekly - 3 sets - 15 reps - Seated Hip Abduction with Resistance  - 1 x daily - 7 x weekly - 3 sets - 15 reps - Seated Long Arc Quad  - 1 x daily - 7 x weekly - 3 sets - 15 reps - Shoulder External Rotation and Scapular Retraction with Resistance  - 1 x daily - 7 x weekly - 2 sets - 10 reps - Seated Shoulder Horizontal Abduction with Resistance  - 1 x daily - 7 x weekly -  2 sets - 10 reps  08/11/23 - Seated Punches  - 1 x daily - 7 x weekly - 3 sets - 10 reps - Seated Shoulder Overhead Press with Dumbbells with PLB  - 1 x daily - 7 x weekly - 3 sets - 10 reps - Seated Bicep Curls Supinated with Dumbbells  - 1 x daily - 7 x weekly - 3 sets - 10 reps - Seated Shoulder Abduction with Dumbbells - Thumbs Up  - 1 x daily - 7 x weekly - 3 sets - 10 reps  ASSESSMENT:  CLINICAL IMPRESSION: The patient was limited by fatigue and leg pain today.  She was able to complete all activities ask for.  She has some difficulty maintaining an awake state.  She was still able to work on some balance activities.  Her balance was fair today.  She required guarding but was able to complete balance activity. Her session was limited 2nd to leg pain.  Therapy will continue to progress as tolerated.  OBJECTIVE IMPAIRMENTS Abnormal gait, decreased activity tolerance, decreased balance, decreased mobility, difficulty walking, decreased strength, impaired tone, and pain.   ACTIVITY LIMITATIONS carrying, lifting, bending, standing, squatting, stairs, transfers, bed mobility, dressing, and locomotion level  PARTICIPATION LIMITATIONS: meal prep, cleaning, laundry, driving, shopping, community activity, and yard work  PERSONAL FACTORS Fibromyalgia; headaches, LBBB; osteopenia  are also affecting patient's functional outcome.   REHAB POTENTIAL: Good  CLINICAL DECISION MAKING: Evolving/moderate complexity increasing pain in her quads   EVALUATION COMPLEXITY: Moderate   GOALS: Goals reviewed with patient? Yes  SHORT TERM GOALS: Target date: 04/18/2023  Patient will transfer sit to stand smoothly and independently  Baseline: Goal status: depends on the day 9/12  2.  Patient will increase gross strength by 5lbs  Baseline:  Goal status: MET  3.  Patient will improve BERG score by 10  Baseline:  Goal status: MET LONG TERM GOALS: Target date:POC  Patient will go improve balance to  CGA for all balance exercises to improve safety  Baseline:  Goal status: INITIAL  2.  Patient will report a 50% reduction in falls  Baseline:  Goal status: INITIAL  3.  Patient will have a full balance program  Baseline:  Goal status: INITIAL 4. Patient will score at least 38/56 on Berg balance scale in order to demonstrate improved balance to reduce the risk for falls at home and in the community.  Baseline:  Goal status: INITIAL   PLAN: PT FREQUENCY: 2x/week  PT DURATION: 8 weeks  PLANNED INTERVENTIONS: Therapeutic exercises, Therapeutic  activity, Neuromuscular re-education, Balance training, Gait training, Patient/Family education, Self Care, Joint mobilization, Stair training, Aquatic Therapy, Cryotherapy, Moist heat, Taping, Manual therapy, and Re-evaluation  PLAN FOR NEXT SESSION: work on improving sit to stand, posterior, dynamic, and static balance. Work on general conditioning. Be aware of leg pain on any given day.    Dessie Coma, PT DPT 08/24/2023, 12:48 PM

## 2023-08-28 NOTE — Progress Notes (Signed)
 [name] [address] [place] [address] Phone: [phone] Subjective:   Morgan [name], am serving as a scribe for Dr. Arthea Claudene. I'm seeing this patient by the request  of:  [name], MD  CC: Multiple joint complaint follow-up  YEP:Dlagzrupcz  06/08/2023 Patient has made significant strides from the very beginning of treatment. She has lost weight, better muscle control. Discussed icing regimen and home exercises. Discussed which activities to do very well with the physical therapy. They would like to continue to do this at least once a week. I would like to restart this. Discussed avoiding certain activities. Discussed home exercises and icing regimen. Follow-up again in 6 to 8 weeks   Update 09/04/2023 Morgan [name] is a 72 y.o. female coming in with complaint of fibromyalgia and Parkinson's disease. Patient states legs are doing better, things seem to be doing better with change of medications and PT is going well.  Nothing that is truly stopping her with activity.  Is going to start doing more lifting mechanics in the near future with physical therapy.       Past Medical History:  Diagnosis Date   Anxiety    Fibromyalgia    Headache    Hypercholesteremia    controlled on medication   Hyperlipidemia    Hypertension    no current meds   LBBB (left bundle branch block)    Melanoma (HCC)    MVP (mitral valve prolapse)    Osteopenia    Parkinson disease (HCC)    Pneumonia    SVD (spontaneous vaginal delivery)    x 2   Past Surgical History:  Procedure Laterality Date   BLEPHAROPLASTY     cold knife conization     COLONOSCOPY     FH colon CA   CYSTOSCOPY W/ URETERAL STENT PLACEMENT Left 10/18/2021   Procedure: CYSTOSCOPY WITH RETROGRADE PYELOGRAM, URETERAL STENT PLACEMENT;  Surgeon: Nieves Cough, MD;  Location: [facility];  Service: Urology;  Laterality: Left;   DILATATION & CURETTAGE/HYSTEROSCOPY WITH MYOSURE  N/A 02/14/2018   Procedure: ATTEMPTED DILATATION & CURETTAGE/HYSTEROSCOPY WITH MYOSURE;  Surgeon: Rosalva Sawyer, MD;  Location: [facility];  Service: Gynecology;  Laterality: N/A;  polyp   melanoma cancer     removed from left leg   RECONSTRUCTION OF EYELID     TONSILLECTOMY     TUBAL LIGATION     URETEROSCOPY WITH HOLMIUM LASER LITHOTRIPSY Left 12/17/2021   Procedure: URETEROSCOPY/HOLMIUM LASER/STENT EXCHANGE;  Surgeon: Nieves Cough, MD;  Location: [facility];  Service: Urology;  Laterality: Left;   WISDOM TOOTH EXTRACTION     Social History   Socioeconomic History   Marital status: Married    Spouse name: Not on file   Number of children: 2   Years of education: Not on file   Highest education level: Bachelor's degree (e.g., BA, AB, BS)  Occupational History   Occupation: retired    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  Tobacco Use   Smoking status: Former    Current packs/day: 0.00    Types: Cigarettes    Quit date: 09/29/1997    Years since quitting: 25.9   Smokeless tobacco: Never   Tobacco comments:    chews [place], 10-12/day  Vaping Use   Vaping status: Never Used  Substance and Sexual Activity   Alcohol  use: Yes    Comment: Occasional wine   Drug use: No   Sexual activity: Not on file  Other Topics Concern  Not on file  Social History Narrative   right handed   2 story home    Lives with spouse   Social Drivers of Health   Financial Resource Strain: Not on file  Food Insecurity: No Food Insecurity (05/17/2022)   Hunger Vital Sign    Worried About Running Out of Food in the Last Year: Never true    Ran Out of Food in the Last Year: Never true  Transportation Needs: No Transportation Needs (05/31/2022)   PRAPARE - Administrator, Civil Service (Medical): No    Lack of Transportation (Non-Medical): No  Physical Activity: Not on file  Stress: Not on file  Social Connections: Not on file   Allergies  Allergen Reactions   Other Anaphylaxis    Anti  Seizure Medications   Phenergan [Promethazine Hcl] Other (See Comments)    Makes Parkinson's worse   Acetaminophen  Other (See Comments)    I just get weird   Itraconazole Hives   Tamsulosin  Other (See Comments)    B/P DROPPED TOO MUCH!!   Family History  Problem Relation Age of Onset   Cancer Mother        colon   Healthy Brother    Healthy Sister    Healthy Child     Current Outpatient Medications (Endocrine & Metabolic):    raloxifene  (EVISTA ) 60 MG tablet, Take 60 mg by mouth in the morning.  Current Outpatient Medications (Cardiovascular):    atorvastatin  (LIPITOR) 10 MG tablet, Take 10 mg by mouth in the morning.   midodrine  (PROAMATINE ) 2.5 MG tablet, Take 1 tablet (2.5 mg total) by mouth 3 (three) times daily as needed (for systolic blood pressure less than 100).   amLODipine  (NORVASC ) 2.5 MG tablet, Take 1 tablet (2.5 mg total) by mouth daily.   Current Outpatient Medications (Analgesics):    aspirin  EC 81 MG tablet, Take 81 mg by mouth daily. Swallow whole.   Ibuprofen  200 MG CAPS, Take 2 capsules (400 mg total) by mouth every 8 (eight) hours as needed (Leg pain, headache or mild pain).   Current Outpatient Medications (Other):    ALPRAZolam (XANAX) 0.25 MG tablet, Take 0.25 mg by mouth daily as needed for anxiety.    BLINK TEARS 0.25 % SOLN, Place 1 drop into both eyes 3 (three) times daily as needed (for dryness).   carbidopa -levodopa  (SINEMET  IR) 25-100 MG tablet, 1 tablet every 2 hours starting at 6-7am (getting about 6-7 in per day)The medication have a score line so it can be broken.   Cholecalciferol (VITAMIN D3) 25 MCG (1000 UT) CHEW, Chew 2,500 Units by mouth daily.   Coenzyme Q10-Vitamin E (QUNOL ULTRA COQ10 PO), Take 1 capsule by mouth daily.   CYMBALTA  60 MG capsule, TAKE 1 CAPSULE EVERY       MORNING   gabapentin  (NEURONTIN ) 400 MG capsule, Take 1 capsule (400 mg total) by mouth 3 (three) times daily as needed (for leg pain or soreness).   Levodopa   (INBRIJA ) 42 MG CAPS, You can inhale the capsules as needed up to 5 times per day, separated by 2 hour intervals.   methocarbamol  (ROBAXIN ) 750 MG tablet, Take 750 mg by mouth every 6 (six) hours as needed for muscle spasms (or leg pain).   Multiple Vitamins-Minerals (PRESERVISION AREDS 2 PO), Take 1 capsule by mouth 2 (two) times daily.   Pramipexole  Dihydrochloride 0.75 MG TB24, TAKE 1 TABLET DAILY   ramelteon  (ROZEREM ) 8 MG tablet, TAKE 1 TABLET AT BEDTIME.  TART CHERRY PO, Take 3,000 mg by mouth daily.   Reviewed prior external information including notes and imaging from  primary care provider As well as notes that were available from care everywhere and other healthcare systems.  Past medical history, social, surgical and family history all reviewed in electronic medical record.  No pertanent information unless stated regarding to the chief complaint.   Review of Systems:  No headache, visual changes, nausea, vomiting, diarrhea, constipation, dizziness, abdominal pain, skin rash, fevers, chills, night sweats, weight loss, swollen lymph nodes, body aches, joint swelling, chest pain, shortness of breath, mood changes. POSITIVE muscle aches  Objective  Blood pressure 102/70, pulse 75, height 5' 4 (1.626 m), SpO2 95%.   General: No apparent distress alert and oriented x3 mood and affect normal, dressed appropriately.  HEENT: Pupils equal, extraocular movements intact  Respiratory: Patient's speak in full sentences and does not appear short of breath  Cardiovascular: No lower extremity edema, non tender, no erythema  Patient does have a resting tremor noted.  Patient does ambulate with the aid of a walking stick but patient is able to get out of a seat much quicker than she has done previously.  Mild shuffling gait but also improved with patient's balance and coordination it appears.    Impression and Recommendations:     The above documentation has been reviewed and is accurate and  complete Kerby Hockley M Melanni Benway, DO

## 2023-08-31 DIAGNOSIS — R8290 Unspecified abnormal findings in urine: Secondary | ICD-10-CM | POA: Diagnosis not present

## 2023-09-01 ENCOUNTER — Ambulatory Visit (HOSPITAL_BASED_OUTPATIENT_CLINIC_OR_DEPARTMENT_OTHER): Payer: Medicare Other | Admitting: Physical Therapy

## 2023-09-01 ENCOUNTER — Encounter (HOSPITAL_BASED_OUTPATIENT_CLINIC_OR_DEPARTMENT_OTHER): Payer: Self-pay | Admitting: Physical Therapy

## 2023-09-01 DIAGNOSIS — R8290 Unspecified abnormal findings in urine: Secondary | ICD-10-CM | POA: Diagnosis not present

## 2023-09-05 ENCOUNTER — Encounter: Payer: Self-pay | Admitting: Family Medicine

## 2023-09-05 ENCOUNTER — Ambulatory Visit (INDEPENDENT_AMBULATORY_CARE_PROVIDER_SITE_OTHER): Payer: Medicare Other | Admitting: Family Medicine

## 2023-09-05 VITALS — BP 102/70 | HR 75 | Ht 64.0 in

## 2023-09-05 DIAGNOSIS — G20B2 Parkinson's disease with dyskinesia, with fluctuations: Secondary | ICD-10-CM | POA: Diagnosis not present

## 2023-09-05 NOTE — Assessment & Plan Note (Signed)
 Patient has done remarkably well.  Did discuss that I do think balance and coordination will continue to improve with potentially some strength training.  Patient has made a very good improvement at the moment.

## 2023-09-05 NOTE — Patient Instructions (Addendum)
 Good to see you  Have fun at [facility] Follow up in 3 months

## 2023-09-06 ENCOUNTER — Ambulatory Visit (HOSPITAL_BASED_OUTPATIENT_CLINIC_OR_DEPARTMENT_OTHER): Payer: Medicare Other | Attending: Family Medicine | Admitting: Physical Therapy

## 2023-09-06 DIAGNOSIS — R2689 Other abnormalities of gait and mobility: Secondary | ICD-10-CM | POA: Diagnosis not present

## 2023-09-06 DIAGNOSIS — G20A1 Parkinson's disease without dyskinesia, without mention of fluctuations: Secondary | ICD-10-CM | POA: Diagnosis not present

## 2023-09-06 NOTE — Therapy (Signed)
 OUTPATIENT PHYSICAL THERAPY LOWER EXTREMITY TREATMENT   Patient Name: Morgan [name] MRN: [id number] DOB:May 16, 1951, 73 y.o., female Today's Date: 09/06/2023     PT End of Session - 09/06/23 1108     Visit Number 33    Number of Visits 56    Date for PT Re-Evaluation 09/27/23    Progress Note Due on Visit 36    PT Start Time 1102    PT Stop Time 1144    PT Time Calculation (min) 42 min    Equipment Utilized During Treatment Gait belt    Activity Tolerance Patient tolerated treatment well    Behavior During Therapy WFL for tasks assessed/performed                    Past Medical History:  Diagnosis Date   Anxiety    Fibromyalgia    Headache    Hypercholesteremia    controlled on medication   Hyperlipidemia    Hypertension    no current meds   LBBB (left bundle branch block)    Melanoma (HCC)    MVP (mitral valve prolapse)    Osteopenia    Parkinson disease (HCC)    Pneumonia    SVD (spontaneous vaginal delivery)    x 2   Past Surgical History:  Procedure Laterality Date   BLEPHAROPLASTY     cold knife conization     COLONOSCOPY     FH colon CA   CYSTOSCOPY W/ URETERAL STENT PLACEMENT Left 10/18/2021   Procedure: CYSTOSCOPY WITH RETROGRADE PYELOGRAM, URETERAL STENT PLACEMENT;  Surgeon: Nieves Cough, MD;  Location: [facility];  Service: Urology;  Laterality: Left;   DILATATION & CURETTAGE/HYSTEROSCOPY WITH MYOSURE N/A 02/14/2018   Procedure: ATTEMPTED DILATATION & CURETTAGE/HYSTEROSCOPY WITH MYOSURE;  Surgeon: Rosalva Sawyer, MD;  Location: [facility];  Service: Gynecology;  Laterality: N/A;  polyp   melanoma cancer     removed from left leg   RECONSTRUCTION OF EYELID     TONSILLECTOMY     TUBAL LIGATION     URETEROSCOPY WITH HOLMIUM LASER LITHOTRIPSY Left 12/17/2021   Procedure: URETEROSCOPY/HOLMIUM LASER/STENT EXCHANGE;  Surgeon: Nieves Cough, MD;  Location: [facility];  Service: Urology;  Laterality: Left;   WISDOM TOOTH EXTRACTION     Patient  Active Problem List   Diagnosis Date Noted   COVID-19 virus infection 05/16/2022   HTN (hypertension) 10/20/2021   Physical deconditioning 10/20/2021   Hydronephrosis with infection 10/18/2021   Bacteremia due to Escherichia coli 10/18/2021   Severe sepsis (HCC) 10/17/2021   UTI (urinary tract infection) 10/17/2021   Acute metabolic encephalopathy 10/17/2021   Parkinson disease (HCC) 10/17/2021   Cervical stenosis (uterine cervix) 02/14/2018   Postmenopausal bleeding 02/14/2018   LBBB (left bundle branch block) 12/25/2015   PVCs (premature ventricular contractions) 12/25/2015   Hyperlipidemia 12/25/2015   Fibromyalgia 01/16/2014   Cholelithiasis 04/25/2013   Paralysis agitans (HCC) 01/15/2013   Hereditary and idiopathic peripheral neuropathy 01/15/2013     Alberta Sharps MD PCP:   [facility] PROVIDER: Arthea Sharps MD   REFERRING DIAG: Parkinsons   THERAPY DIAG:  Other abnormalities of gait and mobility  Parkinson's disease without dyskinesia or fluctuating manifestations (HCC)  Rationale for Evaluation and Treatment Rehabilitation  ONSET DATE: Several years   SUBJECTIVE:   SUBJECTIVE STATEMENT: Patient states hamstring/hip has been sore on L.   PERTINENT HISTORY: Fibromyalgia; headaches, LBBB; osteopenia   PAIN:  Are you having pain? no: NPRS scale: 0/10 Location: Pain description:    Aggravating  factors: just comes and goes  Relieving factors: just comes and goes; sometimes ibuprofen  works but she is limited as to how much she can take  PRECAUTIONS: Patient reports that because of her heart she will pass out at times.   WEIGHT BEARING RESTRICTIONS No  FALLS:  Has patient fallen in last 6 months? Yes.  6 falls but likley more. Last fall    LIVING ENVIRONMENT: Stairs to the second floor in her house but she dosen't do much  4 steps in the front  2 steps in the garage  OCCUPATION: retired   Presenter, Broadcasting: reading; goes to an exercise class   PLOF:  Independent with household mobility with device  PATIENT GOALS  To have less pain in her legs and to move better    OBJECTIVE:  (measurements taken at evaluation unless otherwise noted)   DIAGNOSTIC FINDINGS:   PATIENT SURVEYS:  FOTO    COGNITION:  Overall cognitive status: Within functional limits for tasks assessed     SENSATION: Denies paresthesias   POSTURE: No Significant postural limitations  PALPATION: No trigger points noted   LOWER EXTREMITY ROM:  Passive ROM Right eval Left eval  Hip flexion    Hip extension    Hip abduction    Hip adduction    Hip internal rotation    Hip external rotation    Knee flexion    Knee extension    Ankle dorsiflexion    Ankle plantarflexion    Ankle inversion    Ankle eversion     (Blank rows = not tested)  WNL  LOWER EXTREMITY MMT:  MMT Right eval Left eval Right  7/22 Left  7/2 Right  9/12 Left 9/12 Right 06/13/23 Left 06/13/23 Right 08/02/23 Left 08/02/23  Hip flexion 21.7 29.7 23.5 25.9 24.6 25.6 41.3 37.8 47 45.5  Hip extension            Hip abduction 27.6 18.5 19.1 8.7 21.2 14.4 29.9 29.5 35.5 35.3  Hip adduction            Hip internal rotation            Hip external rotation            Knee flexion            Knee extension 23.5 20.2 17.7 16.8 22.7 16.3 39.4 49.8 48.8 53.5  Ankle dorsiflexion            Ankle plantarflexion            Ankle inversion            Ankle eversion             (Blank rows = not tested)  GAIT: Decreased hip flexion; leans to the left; right hip frop. Patient required CGA while walking and min a when she turned for balance. No change from IE   Functional tests:  Sit to stand: cuing to catch balance. Uses momentum to stand.    5x sit to stand Balance: 13 sec   9/12 5x sit to stand 13 sec Narrow base min a  Narrow base eyes closed min to mod a  Tandem stance Min a both ways.  Single leg stance 5 sec bilateral     06/13/23: 285 feet with walking stick, CGA   for safety  Reassessment 08/02/23:  258 feet with walking stick, CGA  for safety 5xSTS: 13.5 seconds with poor eccentric control  BERG Balance Test  Date:      Sit to Stand 1 2 06/13/23: 2 08/02/23: 3  Standing unsupported 3 3 3 3   Sitting with back unsupported but feet supported 1 3 4 4   Stand to sit  1 2 3 3   Transfers  2 1 2 2   Standing unsupported with eyes closed 3 3 3 3   Standing unsupported feet together 3 3 3 3   From standing position, reach forward with outstretched arm 1 2 3 3   From standing position, pick up object from floor 1 2 3 3   From standing position, turn and look behind over each shoulder 1 2 2 3   Turn 360 2 1 2 2   Standing unsupported, alternately place foot on step 1 1 2 2   Standing unsupported, one foot in front 1 1 2 2   Standing on one leg 0 0 2 1  Total:  21/56 26/56 34/56  33/56      TODAY'S TREATMENT: 09/06/23 Nustep 6 minutes for dynamic warm up and conditioning level 5, seat 7 Active hamstring stretch 10 x 10 second holds Supine hamstring isometrics 10 x 5 second holds LAQ 3# 1 x 10 second holds Seated shoulder press 4# 3 x 10 Seated tricep dips 3 x 10    12/26 Nustep 6 minutes for dynamic warm up and conditioning level 5, seat 7 Punches 3 lbs 3x10  Seated punches 4# 3 x 10 Seated shoulder press 4# 3 x 10  Seated bicep curls 4# 3 x 10  Air-ex 2x30 seconds EO. Had to go to the bathroom after second set.   Gait: walked into the gym with CGA 30'x2  Walked to the bathroom 75'x2      12/23  Nustep 6 minutes for dynamic warm up and conditioning level 5, seat 7 Punches 3 lbs 3x10  Seated punches 4# 3 x 10 Seated shoulder press 4# 3 x 10  Seated bicep curls 4# 3 x 10  LAQ 3x10 red  March 3x10 red   Narrow base of support air-ex EO and EC 2x30  Heel toe on air-ex x30  Stnaidng march 2x10 on airpex    08/18/23 Nustep 6 minutes for dynamic warm up and conditioning level 5, seat 7 Seated punches 4# 3 x 10 Seated shoulder  press 4# 3 x 10  Seated bicep curls 4# 3 x 10 Seated tricep dips 3 x 10  Anterior step with overhead reach 2 x 10 bilateral Lateral step with opening reach 2 x 10 bilateral NBOS on foam with anterior and lateral reaches 2 x 10   08/16/23 Nustep 6 minutes for dynamic warm up and conditioning level 5, seat 7 Seated punches 3# 3 x 10 Seated shoulder press 3# 3 x 10  Seated bicep curls 3# 3 x 10 Seated tricep dips 3 x 10  STS with overhead reach 3 x 10 Standing step taps 4 inch step 2 x 20 bilateral   08/04/23 Nustep 6 minutes for dynamic warm up and conditioning level 5, seat 7 Seated punches 3# 3 x 10 Seated shoulder press 3# 3 x 10  Seated small tricep dips 2 x 10 Seated bicep curls 3# 3 x 10 Standing lunges with UE support 2 x 10 bilateral Shoulder abduction 1# 2 x 10   08/04/23 Nustep 6 minutes for dynamic warm up and conditioning level 5, seat 7 Step up with contralateral knee drive 4 inch 2 x 10 bilateral  Anterior step with overhead reach 2 x 10 bilateral Lateral step with overhead  reach 2 x 10 bilateral Shoulder horizontal abduction RTB 2 x 10 Bilateral shoulder ER RTB 2 x 10 Lateral stepping at rail 4 x 15 feet bilateral  Shoulder flexion RTB in hands bilateral 2 x 10   08/02/23 Reassessment  Nustep 6 minutes for dynamic warm up and conditioning level 5, seat 7      PATIENT EDUCATION:  Education details: exercise rationale and technique   Person educated: Patient Education method: Explanation, Demonstration, Tactile cues, Verbal cues, Education comprehension: verbalized understanding, returned demonstration, verbal cues required, tactile cues required, and needs further education   HOME EXERCISE PROGRAM: Access Code: [email or web address]   URL: [email or web address] Date: 06/28/2023 - Seated March with Resistance  - 1 x daily - 7 x weekly - 3 sets - 15 reps - Seated Hip Abduction with Resistance  - 1 x daily - 7 x weekly - 3 sets - 15 reps - Seated  Long Arc Quad  - 1 x daily - 7 x weekly - 3 sets - 15 reps - Shoulder External Rotation and Scapular Retraction with Resistance  - 1 x daily - 7 x weekly - 2 sets - 10 reps - Seated Shoulder Horizontal Abduction with Resistance  - 1 x daily - 7 x weekly - 2 sets - 10 reps  08/11/23 - Seated Punches  - 1 x daily - 7 x weekly - 3 sets - 10 reps - Seated Shoulder Overhead Press with Dumbbells with PLB  - 1 x daily - 7 x weekly - 3 sets - 10 reps - Seated Bicep Curls Supinated with Dumbbells  - 1 x daily - 7 x weekly - 3 sets - 10 reps - Seated Shoulder Abduction with Dumbbells - Thumbs Up  - 1 x daily - 7 x weekly - 3 sets - 10 reps  ASSESSMENT:  CLINICAL IMPRESSION: Patient will be out of town for next few weeks and patient will be reassessed following return for probable extension of POC. Performed exercise for hamstring mobility and activation with minimal change in symptoms following. Continued with strengthening exercises today and patient notes moderate to high fatigue at end of session. Patient will continue to benefit from physical therapy in order to improve function and reduce impairment.   OBJECTIVE IMPAIRMENTS Abnormal gait, decreased activity tolerance, decreased balance, decreased mobility, difficulty walking, decreased strength, impaired tone, and pain.   ACTIVITY LIMITATIONS carrying, lifting, bending, standing, squatting, stairs, transfers, bed mobility, dressing, and locomotion level  PARTICIPATION LIMITATIONS: meal prep, cleaning, laundry, driving, shopping, community activity, and yard work  PERSONAL FACTORS Fibromyalgia; headaches, LBBB; osteopenia  are also affecting patient's functional outcome.   REHAB POTENTIAL: Good  CLINICAL DECISION MAKING: Evolving/moderate complexity increasing pain in her quads   EVALUATION COMPLEXITY: Moderate   GOALS: Goals reviewed with patient? Yes  SHORT TERM GOALS: Target date: 04/18/2023  Patient will transfer sit to stand smoothly  and independently  Baseline: Goal status: depends on the day 9/12  2.  Patient will increase gross strength by 5lbs  Baseline:  Goal status: MET  3.  Patient will improve BERG score by 10  Baseline:  Goal status: MET LONG TERM GOALS: Target date:POC  Patient will go improve balance to CGA for all balance exercises to improve safety  Baseline:  Goal status: INITIAL  2.  Patient will report a 50% reduction in falls  Baseline:  Goal status: INITIAL  3.  Patient will have a full balance program  Baseline:  Goal status: INITIAL 4. Patient will score  at least 38/56 on Berg balance scale in order to demonstrate improved balance to reduce the risk for falls at home and in the community.  Baseline:  Goal status: INITIAL   PLAN: PT FREQUENCY: 2x/week  PT DURATION: 8 weeks  PLANNED INTERVENTIONS: Therapeutic exercises, Therapeutic activity, Neuromuscular re-education, Balance training, Gait training, Patient/Family education, Self Care, Joint mobilization, Stair training, Aquatic Therapy, Cryotherapy, Moist heat, Taping, Manual therapy, and Re-evaluation  PLAN FOR NEXT SESSION: work on improving sit to stand, posterior, dynamic, and static balance. Work on general conditioning. Be aware of leg pain on any given day.    Marleena Shubert S Jahn Franchini, PT DPT 09/06/2023, 11:49 AM

## 2023-09-07 DIAGNOSIS — N3020 Other chronic cystitis without hematuria: Secondary | ICD-10-CM | POA: Diagnosis not present

## 2023-09-08 ENCOUNTER — Encounter (HOSPITAL_BASED_OUTPATIENT_CLINIC_OR_DEPARTMENT_OTHER): Payer: Medicare Other | Admitting: Physical Therapy

## 2023-09-13 ENCOUNTER — Encounter (HOSPITAL_BASED_OUTPATIENT_CLINIC_OR_DEPARTMENT_OTHER): Payer: Medicare Other | Admitting: Physical Therapy

## 2023-09-15 ENCOUNTER — Encounter (HOSPITAL_BASED_OUTPATIENT_CLINIC_OR_DEPARTMENT_OTHER): Payer: Medicare Other | Admitting: Physical Therapy

## 2023-09-20 ENCOUNTER — Encounter (HOSPITAL_BASED_OUTPATIENT_CLINIC_OR_DEPARTMENT_OTHER): Payer: Medicare Other | Admitting: Physical Therapy

## 2023-09-23 ENCOUNTER — Encounter (HOSPITAL_BASED_OUTPATIENT_CLINIC_OR_DEPARTMENT_OTHER): Payer: Medicare Other | Admitting: Physical Therapy

## 2023-09-23 DIAGNOSIS — E876 Hypokalemia: Secondary | ICD-10-CM | POA: Diagnosis not present

## 2023-09-23 DIAGNOSIS — I051 Rheumatic mitral insufficiency: Secondary | ICD-10-CM | POA: Diagnosis not present

## 2023-09-23 DIAGNOSIS — R051 Acute cough: Secondary | ICD-10-CM | POA: Diagnosis not present

## 2023-09-23 DIAGNOSIS — E7849 Other hyperlipidemia: Secondary | ICD-10-CM | POA: Diagnosis not present

## 2023-09-23 DIAGNOSIS — I129 Hypertensive chronic kidney disease with stage 1 through stage 4 chronic kidney disease, or unspecified chronic kidney disease: Secondary | ICD-10-CM | POA: Diagnosis not present

## 2023-09-23 DIAGNOSIS — D7219 Other eosinophilia: Secondary | ICD-10-CM | POA: Diagnosis not present

## 2023-09-23 DIAGNOSIS — J189 Pneumonia, unspecified organism: Secondary | ICD-10-CM | POA: Diagnosis not present

## 2023-09-23 DIAGNOSIS — J810 Acute pulmonary edema: Secondary | ICD-10-CM | POA: Diagnosis not present

## 2023-09-23 DIAGNOSIS — N182 Chronic kidney disease, stage 2 (mild): Secondary | ICD-10-CM | POA: Diagnosis not present

## 2023-09-23 DIAGNOSIS — Z87891 Personal history of nicotine dependence: Secondary | ICD-10-CM | POA: Diagnosis not present

## 2023-09-23 DIAGNOSIS — B958 Unspecified staphylococcus as the cause of diseases classified elsewhere: Secondary | ICD-10-CM | POA: Diagnosis not present

## 2023-09-23 DIAGNOSIS — N390 Urinary tract infection, site not specified: Secondary | ICD-10-CM | POA: Diagnosis not present

## 2023-09-23 DIAGNOSIS — A419 Sepsis, unspecified organism: Secondary | ICD-10-CM | POA: Diagnosis not present

## 2023-09-23 DIAGNOSIS — R0603 Acute respiratory distress: Secondary | ICD-10-CM | POA: Diagnosis not present

## 2023-09-23 DIAGNOSIS — A4189 Other specified sepsis: Secondary | ICD-10-CM | POA: Diagnosis not present

## 2023-09-23 DIAGNOSIS — I10 Essential (primary) hypertension: Secondary | ICD-10-CM | POA: Diagnosis not present

## 2023-09-23 DIAGNOSIS — Z888 Allergy status to other drugs, medicaments and biological substances status: Secondary | ICD-10-CM | POA: Diagnosis not present

## 2023-09-23 DIAGNOSIS — G20A1 Parkinson's disease without dyskinesia, without mention of fluctuations: Secondary | ICD-10-CM | POA: Diagnosis not present

## 2023-09-23 DIAGNOSIS — B957 Other staphylococcus as the cause of diseases classified elsewhere: Secondary | ICD-10-CM | POA: Diagnosis not present

## 2023-09-23 DIAGNOSIS — J1100 Influenza due to unidentified influenza virus with unspecified type of pneumonia: Secondary | ICD-10-CM | POA: Diagnosis not present

## 2023-09-27 ENCOUNTER — Encounter (HOSPITAL_BASED_OUTPATIENT_CLINIC_OR_DEPARTMENT_OTHER): Payer: Medicare Other | Admitting: Physical Therapy

## 2023-09-28 DIAGNOSIS — N182 Chronic kidney disease, stage 2 (mild): Secondary | ICD-10-CM | POA: Diagnosis present

## 2023-09-30 ENCOUNTER — Encounter (HOSPITAL_BASED_OUTPATIENT_CLINIC_OR_DEPARTMENT_OTHER): Payer: Medicare Other | Admitting: Physical Therapy

## 2023-10-04 ENCOUNTER — Encounter (HOSPITAL_BASED_OUTPATIENT_CLINIC_OR_DEPARTMENT_OTHER): Payer: Medicare Other | Admitting: Physical Therapy

## 2023-10-05 DIAGNOSIS — Z7689 Persons encountering health services in other specified circumstances: Secondary | ICD-10-CM | POA: Diagnosis not present

## 2023-10-05 DIAGNOSIS — G20A1 Parkinson's disease without dyskinesia, without mention of fluctuations: Secondary | ICD-10-CM | POA: Diagnosis not present

## 2023-10-05 DIAGNOSIS — J189 Pneumonia, unspecified organism: Secondary | ICD-10-CM | POA: Diagnosis not present

## 2023-10-05 DIAGNOSIS — Z09 Encounter for follow-up examination after completed treatment for conditions other than malignant neoplasm: Secondary | ICD-10-CM | POA: Diagnosis not present

## 2023-10-05 DIAGNOSIS — I10 Essential (primary) hypertension: Secondary | ICD-10-CM | POA: Diagnosis not present

## 2023-10-06 ENCOUNTER — Encounter (HOSPITAL_BASED_OUTPATIENT_CLINIC_OR_DEPARTMENT_OTHER): Payer: Medicare Other | Admitting: Physical Therapy

## 2023-10-10 ENCOUNTER — Encounter (HOSPITAL_BASED_OUTPATIENT_CLINIC_OR_DEPARTMENT_OTHER): Payer: Medicare Other | Admitting: Physical Therapy

## 2023-10-13 ENCOUNTER — Encounter (HOSPITAL_BASED_OUTPATIENT_CLINIC_OR_DEPARTMENT_OTHER): Payer: Medicare Other | Admitting: Physical Therapy

## 2023-10-18 ENCOUNTER — Encounter (HOSPITAL_BASED_OUTPATIENT_CLINIC_OR_DEPARTMENT_OTHER): Payer: Self-pay | Admitting: Physical Therapy

## 2023-10-18 ENCOUNTER — Ambulatory Visit (HOSPITAL_BASED_OUTPATIENT_CLINIC_OR_DEPARTMENT_OTHER): Payer: Medicare Other | Attending: Family Medicine | Admitting: Physical Therapy

## 2023-10-18 DIAGNOSIS — R2689 Other abnormalities of gait and mobility: Secondary | ICD-10-CM | POA: Insufficient documentation

## 2023-10-18 DIAGNOSIS — G20A1 Parkinson's disease without dyskinesia, without mention of fluctuations: Secondary | ICD-10-CM | POA: Diagnosis not present

## 2023-10-18 NOTE — Therapy (Signed)
 OUTPATIENT PHYSICAL THERAPY LOWER EXTREMITY TREATMENT   Patient Name: Morgan [name] MRN: [id number] DOB:Oct 31, 1950, 73 y.o., female Today's Date: 10/18/2023  Progress Note   Reporting Period 08/02/23 to 10/18/23   See note below for Objective Data and Assessment of Progress/Goals     PT End of Session - 10/18/23 1015     Visit Number 34    Number of Visits 72    Date for PT Re-Evaluation 12/13/23    Progress Note Due on Visit 44    PT Start Time 1015    PT Stop Time 1055    PT Time Calculation (min) 40 min    Equipment Utilized During Treatment Gait belt    Activity Tolerance Patient tolerated treatment well    Behavior During Therapy WFL for tasks assessed/performed                    Past Medical History:  Diagnosis Date   Anxiety    Fibromyalgia    Headache    Hypercholesteremia    controlled on medication   Hyperlipidemia    Hypertension    no current meds   LBBB (left bundle branch block)    Melanoma (HCC)    MVP (mitral valve prolapse)    Osteopenia    Parkinson disease (HCC)    Pneumonia    SVD (spontaneous vaginal delivery)    x 2   Past Surgical History:  Procedure Laterality Date   BLEPHAROPLASTY     cold knife conization     COLONOSCOPY     FH colon CA   CYSTOSCOPY W/ URETERAL STENT PLACEMENT Left 10/18/2021   Procedure: CYSTOSCOPY WITH RETROGRADE PYELOGRAM, URETERAL STENT PLACEMENT;  Surgeon: Jerilee Field, MD;  Location: [facility];  Service: Urology;  Laterality: Left;   DILATATION & CURETTAGE/HYSTEROSCOPY WITH MYOSURE N/A 02/14/2018   Procedure: ATTEMPTED DILATATION & CURETTAGE/HYSTEROSCOPY WITH MYOSURE;  Surgeon: Gerald Leitz, MD;  Location: [facility];  Service: Gynecology;  Laterality: N/A;  polyp   melanoma cancer     removed from left leg   RECONSTRUCTION OF EYELID     TONSILLECTOMY     TUBAL LIGATION     URETEROSCOPY WITH HOLMIUM LASER LITHOTRIPSY Left 12/17/2021   Procedure: URETEROSCOPY/HOLMIUM LASER/STENT EXCHANGE;   Surgeon: Jerilee Field, MD;  Location: [facility];  Service: Urology;  Laterality: Left;   WISDOM TOOTH EXTRACTION     Patient Active Problem List   Diagnosis Date Noted   COVID-19 virus infection 05/16/2022   HTN (hypertension) 10/20/2021   Physical deconditioning 10/20/2021   Hydronephrosis with infection 10/18/2021   Bacteremia due to Escherichia coli 10/18/2021   Severe sepsis (HCC) 10/17/2021   UTI (urinary tract infection) 10/17/2021   Acute metabolic encephalopathy 10/17/2021   Parkinson disease (HCC) 10/17/2021   Cervical stenosis (uterine cervix) 02/14/2018   Postmenopausal bleeding 02/14/2018   LBBB (left bundle branch block) 12/25/2015   PVCs (premature ventricular contractions) 12/25/2015   Hyperlipidemia 12/25/2015   Fibromyalgia 01/16/2014   Cholelithiasis 04/25/2013   Paralysis agitans (HCC) 01/15/2013   Hereditary and idiopathic peripheral neuropathy 01/15/2013     [name] MD PCP:   Cindi Carbon PROVIDER: Antoine Primas MD   REFERRING DIAG: Parkinsons   THERAPY DIAG:  Other abnormalities of gait and mobility  Parkinson's disease without dyskinesia or fluctuating manifestations (HCC)  Rationale for Evaluation and Treatment Rehabilitation  ONSET DATE: Several years   SUBJECTIVE:   SUBJECTIVE STATEMENT: Patient states got sick on her trip and was in the hospital with  flu, pneumonia, and UTI. Patient feels set back from recent illness and hospitalization. Hasn't had a lot of falls.     PERTINENT HISTORY: Fibromyalgia; headaches, LBBB; osteopenia   PAIN:  Are you having pain? no: NPRS scale: 0/10 Location: Pain description:    Aggravating factors: just comes and goes  Relieving factors: just comes and goes; sometimes ibuprofen works but she is limited as to how much she can take  PRECAUTIONS: Patient reports that because of her heart she will pass out at times.   WEIGHT BEARING RESTRICTIONS No  FALLS:  Has patient fallen in last 6 months?  Yes.  6 falls but likley more. Last fall    LIVING ENVIRONMENT: Stairs to the second floor in her house but she dosen't do much  4 steps in the front  2 steps in the garage  OCCUPATION: retired   Presenter, broadcasting: reading; goes to an exercise class   PLOF: Independent with household mobility with device  PATIENT GOALS  To have less pain in her legs and to move better    OBJECTIVE:  (measurements taken at evaluation unless otherwise noted)   DIAGNOSTIC FINDINGS:   PATIENT SURVEYS:  FOTO    COGNITION:  Overall cognitive status: Within functional limits for tasks assessed     SENSATION: Denies paresthesias   POSTURE: No Significant postural limitations  PALPATION: No trigger points noted   LOWER EXTREMITY ROM:  Passive ROM Right eval Left eval  Hip flexion    Hip extension    Hip abduction    Hip adduction    Hip internal rotation    Hip external rotation    Knee flexion    Knee extension    Ankle dorsiflexion    Ankle plantarflexion    Ankle inversion    Ankle eversion     (Blank rows = not tested)  WNL  LOWER EXTREMITY MMT:  MMT Right eval Left eval Right  7/22 Left  7/2 Right  9/12 Left 9/12 Right 06/13/23 Left 06/13/23 Right 08/02/23 Left 08/02/23 Right 10/18/23 Left 10/18/23  Hip flexion 21.7 29.7 23.5 25.9 24.6 25.6 41.3 37.8 47 45.5 44.9 46  Hip extension              Hip abduction 27.6 18.5 19.1 8.7 21.2 14.4 29.9 29.5 35.5 35.3 25.7 35.5  Hip adduction              Hip internal rotation              Hip external rotation              Knee flexion              Knee extension 23.5 20.2 17.7 16.8 22.7 16.3 39.4 49.8 48.8 53.5 48 42.3  Ankle dorsiflexion              Ankle plantarflexion              Ankle inversion              Ankle eversion               (Blank rows = not tested)  GAIT: Decreased hip flexion; leans to the left; right hip frop. Patient required CGA while walking and min a when she turned for balance. No change from IE    Functional tests:  Sit to stand: cuing to catch balance. Uses momentum to stand.    5x sit to stand Balance: 13 sec  9/12 5x sit to stand 13 sec Narrow base min a  Narrow base eyes closed min to mod a  Tandem stance Min a both ways.  Single leg stance 5 sec bilateral     06/13/23: 285 feet with walking stick, CGA  for safety  Reassessment 08/02/23:  258 feet with walking stick, CGA  for safety 5xSTS: 13.5 seconds with poor eccentric control  Reassessment 10/18/23:  - deferred today due to PT injury 5xSTS: 13.36 seconds with poor eccentric control, posterior lean with transfer/standing  BERG Balance Test          Date:      Sit to Stand 1 2 06/13/23: 2 08/02/23: 3 10/18/23: 3  Standing unsupported 3 3 3 3 3   Sitting with back unsupported but feet supported 1 3 4 4 4   Stand to sit  1 2 3 3 3   Transfers  2 1 2 2 3   Standing unsupported with eyes closed 3 3 3 3 3   Standing unsupported feet together 3 3 3 3 3   From standing position, reach forward with outstretched arm 1 2 3 3 3   From standing position, pick up object from floor 1 2 3 3 3   From standing position, turn and look behind over each shoulder 1 2 2 3 3   Turn 360 2 1 2 2 2   Standing unsupported, alternately place foot on step 1 1 2 2 2   Standing unsupported, one foot in front 1 1 2 2 2   Standing on one leg 0 0 2 1 2   Total:  21/56 26/56 34/56  33/56 34/56      TODAY'S TREATMENT: 10/18/23 Reassessment Seated punches 4# 3 x 10 Seated shoulder press 4# 3 x 10  Seated bicep curls 4# 3 x 10  09/06/23 Nustep 6 minutes for dynamic warm up and conditioning level 5, seat 7 Active hamstring stretch 10 x 10 second holds Supine hamstring isometrics 10 x 5 second holds LAQ 3# 1 x 10 second holds Seated shoulder press 4# 3 x 10 Seated tricep dips 3 x 10    12/26 Nustep 6 minutes for dynamic warm up and conditioning level 5, seat 7 Punches 3 lbs 3x10  Seated punches 4# 3 x 10 Seated shoulder press 4# 3 x 10   Seated bicep curls 4# 3 x 10  Air-ex 2x30 seconds EO. Had to go to the bathroom after second set.   Gait: walked into the gym with CGA 30'x2  Walked to the bathroom 75'x2      12/23  Nustep 6 minutes for dynamic warm up and conditioning level 5, seat 7 Punches 3 lbs 3x10  Seated punches 4# 3 x 10 Seated shoulder press 4# 3 x 10  Seated bicep curls 4# 3 x 10  LAQ 3x10 red  March 3x10 red   Narrow base of support air-ex EO and EC 2x30  Heel toe on air-ex x30  Stnaidng march 2x10 on airpex    08/18/23 Nustep 6 minutes for dynamic warm up and conditioning level 5, seat 7 Seated punches 4# 3 x 10 Seated shoulder press 4# 3 x 10  Seated bicep curls 4# 3 x 10 Seated tricep dips 3 x 10  Anterior step with overhead reach 2 x 10 bilateral Lateral step with opening reach 2 x 10 bilateral NBOS on foam with anterior and lateral reaches 2 x 10   08/16/23 Nustep 6 minutes for dynamic warm up and conditioning level 5, seat 7 Seated punches  3# 3 x 10 Seated shoulder press 3# 3 x 10  Seated bicep curls 3# 3 x 10 Seated tricep dips 3 x 10  STS with overhead reach 3 x 10 Standing step taps 4 inch step 2 x 20 bilateral   08/04/23 Nustep 6 minutes for dynamic warm up and conditioning level 5, seat 7 Seated punches 3# 3 x 10 Seated shoulder press 3# 3 x 10  Seated small tricep dips 2 x 10 Seated bicep curls 3# 3 x 10 Standing lunges with UE support 2 x 10 bilateral Shoulder abduction 1# 2 x 10   08/04/23 Nustep 6 minutes for dynamic warm up and conditioning level 5, seat 7 Step up with contralateral knee drive 4 inch 2 x 10 bilateral  Anterior step with overhead reach 2 x 10 bilateral Lateral step with overhead reach 2 x 10 bilateral Shoulder horizontal abduction RTB 2 x 10 Bilateral shoulder ER RTB 2 x 10 Lateral stepping at rail 4 x 15 feet bilateral  Shoulder flexion RTB in hands bilateral 2 x 10   08/02/23 Reassessment  Nustep 6 minutes for dynamic warm up and  conditioning level 5, seat 7      PATIENT EDUCATION:  Education details: exercise rationale and technique   Person educated: Patient Education method: Explanation, Demonstration, Tactile cues, Verbal cues, Education comprehension: verbalized understanding, returned demonstration, verbal cues required, tactile cues required, and needs further education   HOME EXERCISE PROGRAM: Access Code: [email or web address]   URL: [email or web address] Date: 06/28/2023 - Seated March with Resistance  - 1 x daily - 7 x weekly - 3 sets - 15 reps - Seated Hip Abduction with Resistance  - 1 x daily - 7 x weekly - 3 sets - 15 reps - Seated Long Arc Quad  - 1 x daily - 7 x weekly - 3 sets - 15 reps - Shoulder External Rotation and Scapular Retraction with Resistance  - 1 x daily - 7 x weekly - 2 sets - 10 reps - Seated Shoulder Horizontal Abduction with Resistance  - 1 x daily - 7 x weekly - 2 sets - 10 reps  08/11/23 - Seated Punches  - 1 x daily - 7 x weekly - 3 sets - 10 reps - Seated Shoulder Overhead Press with Dumbbells with PLB  - 1 x daily - 7 x weekly - 3 sets - 10 reps - Seated Bicep Curls Supinated with Dumbbells  - 1 x daily - 7 x weekly - 3 sets - 10 reps - Seated Shoulder Abduction with Dumbbells - Thumbs Up  - 1 x daily - 7 x weekly - 3 sets - 10 reps  ASSESSMENT:  CLINICAL IMPRESSION: Patient has met 2/3 short term goals and 0/3 long term goals with ability to complete HEP and improvement in strength and balance. Remaining goals not met due to continued deficits in symptoms, strength, ROM, activity tolerance, gait, balance, and functional mobility. Patient with slight regression in some strength components likely due to recent illness and hospitalization. Patient has made good progress toward remaining goals. Patient continues to require skilled care for safety, balance and to reduce risk of fall/injury. Patient with increased fatigue today as well and required intermittent rest  breaks. Will continue PT 1-2x/week for 8 weeks. Fatigue 7/10 at end of session. Patient will continue to benefit from skilled physical therapy in order to improve function and reduce impairment.   OBJECTIVE IMPAIRMENTS Abnormal gait, decreased activity tolerance, decreased balance, decreased mobility, difficulty walking, decreased strength,  impaired tone, and pain.   ACTIVITY LIMITATIONS carrying, lifting, bending, standing, squatting, stairs, transfers, bed mobility, dressing, and locomotion level  PARTICIPATION LIMITATIONS: meal prep, cleaning, laundry, driving, shopping, community activity, and yard work  PERSONAL FACTORS Fibromyalgia; headaches, LBBB; osteopenia  are also affecting patient's functional outcome.   REHAB POTENTIAL: Good  CLINICAL DECISION MAKING: Evolving/moderate complexity increasing pain in her quads   EVALUATION COMPLEXITY: Moderate   GOALS: Goals reviewed with patient? Yes  SHORT TERM GOALS: Target date: 04/18/2023  Patient will transfer sit to stand smoothly and independently  Baseline: Goal status: depends on the day 9/12  2.  Patient will increase gross strength by 5lbs  Baseline:  Goal status: MET  3.  Patient will improve BERG score by 10  Baseline:  Goal status: MET LONG TERM GOALS: Target date:POC  Patient will go improve balance to CGA for all balance exercises to improve safety  Baseline:  Goal status: INITIAL  2.  Patient will report a 50% reduction in falls  Baseline:  Goal status: INITIAL  3.  Patient will have a full balance program  Baseline:  Goal status: INITIAL 4. Patient will score at least 38/56 on Berg balance scale in order to demonstrate improved balance to reduce the risk for falls at home and in the community.  Baseline:  Goal status: INITIAL   PLAN: PT FREQUENCY: 2x/week  PT DURATION: 8 weeks  PLANNED INTERVENTIONS: Therapeutic exercises, Therapeutic activity, Neuromuscular re-education, Balance training, Gait  training, Patient/Family education, Self Care, Joint mobilization, Stair training, Aquatic Therapy, Cryotherapy, Moist heat, Taping, Manual therapy, and Re-evaluation  PLAN FOR NEXT SESSION: work on improving sit to stand, posterior, dynamic, and static balance. Work on general conditioning. Be aware of leg pain on any given day.    Wyman Songster, PT DPT 10/18/2023, 10:54 AM

## 2023-10-20 ENCOUNTER — Encounter (HOSPITAL_BASED_OUTPATIENT_CLINIC_OR_DEPARTMENT_OTHER): Payer: Medicare Other | Admitting: Physical Therapy

## 2023-10-21 ENCOUNTER — Ambulatory Visit (HOSPITAL_BASED_OUTPATIENT_CLINIC_OR_DEPARTMENT_OTHER): Payer: Medicare Other | Admitting: Physical Therapy

## 2023-10-21 ENCOUNTER — Encounter (HOSPITAL_BASED_OUTPATIENT_CLINIC_OR_DEPARTMENT_OTHER): Payer: Self-pay | Admitting: Physical Therapy

## 2023-10-21 DIAGNOSIS — R2689 Other abnormalities of gait and mobility: Secondary | ICD-10-CM | POA: Diagnosis not present

## 2023-10-21 DIAGNOSIS — G20A1 Parkinson's disease without dyskinesia, without mention of fluctuations: Secondary | ICD-10-CM | POA: Diagnosis not present

## 2023-10-21 NOTE — Therapy (Signed)
 OUTPATIENT PHYSICAL THERAPY LOWER EXTREMITY TREATMENT   Patient Name: Morgan [name] MRN: [id number] DOB:April 05, 1951, 73 y.o., female Today's Date: 10/21/2023  Progress Note   Reporting Period 08/02/23 to 10/18/23   See note below for Objective Data and Assessment of Progress/Goals     PT End of Session - 10/21/23 1046     Visit Number 35    Number of Visits 72    Date for PT Re-Evaluation 12/13/23    Progress Note Due on Visit 44    PT Start Time 1042    PT Stop Time 1122    PT Time Calculation (min) 40 min    Equipment Utilized During Treatment Gait belt    Activity Tolerance Patient tolerated treatment well    Behavior During Therapy WFL for tasks assessed/performed                    Past Medical History:  Diagnosis Date   Anxiety    Fibromyalgia    Headache    Hypercholesteremia    controlled on medication   Hyperlipidemia    Hypertension    no current meds   LBBB (left bundle branch block)    Melanoma (HCC)    MVP (mitral valve prolapse)    Osteopenia    Parkinson disease (HCC)    Pneumonia    SVD (spontaneous vaginal delivery)    x 2   Past Surgical History:  Procedure Laterality Date   BLEPHAROPLASTY     cold knife conization     COLONOSCOPY     FH colon CA   CYSTOSCOPY W/ URETERAL STENT PLACEMENT Left 10/18/2021   Procedure: CYSTOSCOPY WITH RETROGRADE PYELOGRAM, URETERAL STENT PLACEMENT;  Surgeon: Jerilee Field, MD;  Location: [facility];  Service: Urology;  Laterality: Left;   DILATATION & CURETTAGE/HYSTEROSCOPY WITH MYOSURE N/A 02/14/2018   Procedure: ATTEMPTED DILATATION & CURETTAGE/HYSTEROSCOPY WITH MYOSURE;  Surgeon: Gerald Leitz, MD;  Location: [facility];  Service: Gynecology;  Laterality: N/A;  polyp   melanoma cancer     removed from left leg   RECONSTRUCTION OF EYELID     TONSILLECTOMY     TUBAL LIGATION     URETEROSCOPY WITH HOLMIUM LASER LITHOTRIPSY Left 12/17/2021   Procedure: URETEROSCOPY/HOLMIUM LASER/STENT EXCHANGE;   Surgeon: Jerilee Field, MD;  Location: [facility];  Service: Urology;  Laterality: Left;   WISDOM TOOTH EXTRACTION     Patient Active Problem List   Diagnosis Date Noted   COVID-19 virus infection 05/16/2022   HTN (hypertension) 10/20/2021   Physical deconditioning 10/20/2021   Hydronephrosis with infection 10/18/2021   Bacteremia due to Escherichia coli 10/18/2021   Severe sepsis (HCC) 10/17/2021   UTI (urinary tract infection) 10/17/2021   Acute metabolic encephalopathy 10/17/2021   Parkinson disease (HCC) 10/17/2021   Cervical stenosis (uterine cervix) 02/14/2018   Postmenopausal bleeding 02/14/2018   LBBB (left bundle branch block) 12/25/2015   PVCs (premature ventricular contractions) 12/25/2015   Hyperlipidemia 12/25/2015   Fibromyalgia 01/16/2014   Cholelithiasis 04/25/2013   Paralysis agitans (HCC) 01/15/2013   Hereditary and idiopathic peripheral neuropathy 01/15/2013     [name] MD PCP:   Cindi Carbon PROVIDER: Antoine Primas MD   REFERRING DIAG: Parkinsons   THERAPY DIAG:  Other abnormalities of gait and mobility  Parkinson's disease without dyskinesia or fluctuating manifestations (HCC)  Rationale for Evaluation and Treatment Rehabilitation  ONSET DATE: Several years   SUBJECTIVE:   SUBJECTIVE STATEMENT: Patient states less tired compared to the other day. Still not fully recovered  from being sick.   PERTINENT HISTORY: Fibromyalgia; headaches, LBBB; osteopenia   PAIN:  Are you having pain? no: NPRS scale: 0/10 Location: Pain description:    Aggravating factors: just comes and goes  Relieving factors: just comes and goes; sometimes ibuprofen works but she is limited as to how much she can take  PRECAUTIONS: Patient reports that because of her heart she will pass out at times.   WEIGHT BEARING RESTRICTIONS No  FALLS:  Has patient fallen in last 6 months? Yes.  6 falls but likley more. Last fall    LIVING ENVIRONMENT: Stairs to the second  floor in her house but she dosen't do much  4 steps in the front  2 steps in the garage  OCCUPATION: retired   Presenter, broadcasting: reading; goes to an exercise class   PLOF: Independent with household mobility with device  PATIENT GOALS  To have less pain in her legs and to move better    OBJECTIVE:  (measurements taken at evaluation unless otherwise noted)   DIAGNOSTIC FINDINGS:   PATIENT SURVEYS:  FOTO    COGNITION:  Overall cognitive status: Within functional limits for tasks assessed     SENSATION: Denies paresthesias   POSTURE: No Significant postural limitations  PALPATION: No trigger points noted   LOWER EXTREMITY ROM:  Passive ROM Right eval Left eval  Hip flexion    Hip extension    Hip abduction    Hip adduction    Hip internal rotation    Hip external rotation    Knee flexion    Knee extension    Ankle dorsiflexion    Ankle plantarflexion    Ankle inversion    Ankle eversion     (Blank rows = not tested)  WNL  LOWER EXTREMITY MMT:  MMT Right eval Left eval Right  7/22 Left  7/2 Right  9/12 Left 9/12 Right 06/13/23 Left 06/13/23 Right 08/02/23 Left 08/02/23 Right 10/18/23 Left 10/18/23  Hip flexion 21.7 29.7 23.5 25.9 24.6 25.6 41.3 37.8 47 45.5 44.9 46  Hip extension              Hip abduction 27.6 18.5 19.1 8.7 21.2 14.4 29.9 29.5 35.5 35.3 25.7 35.5  Hip adduction              Hip internal rotation              Hip external rotation              Knee flexion              Knee extension 23.5 20.2 17.7 16.8 22.7 16.3 39.4 49.8 48.8 53.5 48 42.3  Ankle dorsiflexion              Ankle plantarflexion              Ankle inversion              Ankle eversion               (Blank rows = not tested)  GAIT: Decreased hip flexion; leans to the left; right hip frop. Patient required CGA while walking and min a when she turned for balance. No change from IE   Functional tests:  Sit to stand: cuing to catch balance. Uses momentum to stand.     5x sit to stand Balance: 13 sec   9/12 5x sit to stand 13 sec Narrow base min a  Narrow base eyes closed min to  mod a  Tandem stance Min a both ways.  Single leg stance 5 sec bilateral     06/13/23: 285 feet with walking stick, CGA  for safety  Reassessment 08/02/23:  258 feet with walking stick, CGA  for safety 5xSTS: 13.5 seconds with poor eccentric control  Reassessment 10/18/23:  - deferred today due to PT injury 5xSTS: 13.36 seconds with poor eccentric control, posterior lean with transfer/standing  BERG Balance Test          Date:      Sit to Stand 1 2 06/13/23: 2 08/02/23: 3 10/18/23: 3  Standing unsupported 3 3 3 3 3   Sitting with back unsupported but feet supported 1 3 4 4 4   Stand to sit  1 2 3 3 3   Transfers  2 1 2 2 3   Standing unsupported with eyes closed 3 3 3 3 3   Standing unsupported feet together 3 3 3 3 3   From standing position, reach forward with outstretched arm 1 2 3 3 3   From standing position, pick up object from floor 1 2 3 3 3   From standing position, turn and look behind over each shoulder 1 2 2 3 3   Turn 360 2 1 2 2 2   Standing unsupported, alternately place foot on step 1 1 2 2 2   Standing unsupported, one foot in front 1 1 2 2 2   Standing on one leg 0 0 2 1 2   Total:  21/56 26/56 34/56  33/56 34/56      TODAY'S TREATMENT: 10/21/23 Nustep 6 minutes for dynamic warm up and conditioning level 5, seat 7 STS with overhead reach 3 x 10 Standing step taps 4 inch step 2 x 20 bilateral Seated shoulder press 4# 3 x 10  Seated tricep dips 3 x 10  LAQ 3# 1 x 10 with 5 second holds Anterior stepping with overhead reach 2 x 15  10/18/23 Reassessment Seated punches 4# 3 x 10 Seated shoulder press 4# 3 x 10  Seated bicep curls 4# 3 x 10  09/06/23 Nustep 6 minutes for dynamic warm up and conditioning level 5, seat 7 Active hamstring stretch 10 x 10 second holds Supine hamstring isometrics 10 x 5 second holds LAQ 3# 1 x 10 second  holds Seated shoulder press 4# 3 x 10 Seated tricep dips 3 x 10    12/26 Nustep 6 minutes for dynamic warm up and conditioning level 5, seat 7 Punches 3 lbs 3x10  Seated punches 4# 3 x 10 Seated shoulder press 4# 3 x 10  Seated bicep curls 4# 3 x 10  Air-ex 2x30 seconds EO. Had to go to the bathroom after second set.   Gait: walked into the gym with CGA 30'x2  Walked to the bathroom 75'x2      12/23  Nustep 6 minutes for dynamic warm up and conditioning level 5, seat 7 Punches 3 lbs 3x10  Seated punches 4# 3 x 10 Seated shoulder press 4# 3 x 10  Seated bicep curls 4# 3 x 10  LAQ 3x10 red  March 3x10 red   Narrow base of support air-ex EO and EC 2x30  Heel toe on air-ex x30  Stnaidng march 2x10 on airpex    08/18/23 Nustep 6 minutes for dynamic warm up and conditioning level 5, seat 7 Seated punches 4# 3 x 10 Seated shoulder press 4# 3 x 10  Seated bicep curls 4# 3 x 10 Seated tricep dips 3 x 10  Anterior  step with overhead reach 2 x 10 bilateral Lateral step with opening reach 2 x 10 bilateral NBOS on foam with anterior and lateral reaches 2 x 10   08/16/23 Nustep 6 minutes for dynamic warm up and conditioning level 5, seat 7 Seated punches 3# 3 x 10 Seated shoulder press 3# 3 x 10  Seated bicep curls 3# 3 x 10 Seated tricep dips 3 x 10  STS with overhead reach 3 x 10 Standing step taps 4 inch step 2 x 20 bilateral   08/04/23 Nustep 6 minutes for dynamic warm up and conditioning level 5, seat 7 Seated punches 3# 3 x 10 Seated shoulder press 3# 3 x 10  Seated small tricep dips 2 x 10 Seated bicep curls 3# 3 x 10 Standing lunges with UE support 2 x 10 bilateral Shoulder abduction 1# 2 x 10   08/04/23 Nustep 6 minutes for dynamic warm up and conditioning level 5, seat 7 Step up with contralateral knee drive 4 inch 2 x 10 bilateral  Anterior step with overhead reach 2 x 10 bilateral Lateral step with overhead reach 2 x 10 bilateral Shoulder horizontal  abduction RTB 2 x 10 Bilateral shoulder ER RTB 2 x 10 Lateral stepping at rail 4 x 15 feet bilateral  Shoulder flexion RTB in hands bilateral 2 x 10   08/02/23 Reassessment  Nustep 6 minutes for dynamic warm up and conditioning level 5, seat 7      PATIENT EDUCATION:  Education details: exercise rationale and technique   Person educated: Patient Education method: Explanation, Demonstration, Tactile cues, Verbal cues, Education comprehension: verbalized understanding, returned demonstration, verbal cues required, tactile cues required, and needs further education   HOME EXERCISE PROGRAM: Access Code: [email or web address]   URL: [email or web address] Date: 06/28/2023 - Seated March with Resistance  - 1 x daily - 7 x weekly - 3 sets - 15 reps - Seated Hip Abduction with Resistance  - 1 x daily - 7 x weekly - 3 sets - 15 reps - Seated Long Arc Quad  - 1 x daily - 7 x weekly - 3 sets - 15 reps - Shoulder External Rotation and Scapular Retraction with Resistance  - 1 x daily - 7 x weekly - 2 sets - 10 reps - Seated Shoulder Horizontal Abduction with Resistance  - 1 x daily - 7 x weekly - 2 sets - 10 reps  08/11/23 - Seated Punches  - 1 x daily - 7 x weekly - 3 sets - 10 reps - Seated Shoulder Overhead Press with Dumbbells with PLB  - 1 x daily - 7 x weekly - 3 sets - 10 reps - Seated Bicep Curls Supinated with Dumbbells  - 1 x daily - 7 x weekly - 3 sets - 10 reps - Seated Shoulder Abduction with Dumbbells - Thumbs Up  - 1 x daily - 7 x weekly - 3 sets - 10 reps  ASSESSMENT:  CLINICAL IMPRESSION: Began session on nustep for dynamic warm up and conditioning. Patient given cueing throughout session for exercise mechanics with good to fair carry over. CGA provided for safety with all standing exercises and ambulation. Moderate fatigue at 6/10 at end of session. Patient will continue to benefit from physical therapy in order to improve function and reduce impairment.    OBJECTIVE  IMPAIRMENTS Abnormal gait, decreased activity tolerance, decreased balance, decreased mobility, difficulty walking, decreased strength, impaired tone, and pain.   ACTIVITY LIMITATIONS carrying, lifting, bending, standing, squatting, stairs, transfers, bed mobility,  dressing, and locomotion level  PARTICIPATION LIMITATIONS: meal prep, cleaning, laundry, driving, shopping, community activity, and yard work  PERSONAL FACTORS Fibromyalgia; headaches, LBBB; osteopenia  are also affecting patient's functional outcome.   REHAB POTENTIAL: Good  CLINICAL DECISION MAKING: Evolving/moderate complexity increasing pain in her quads   EVALUATION COMPLEXITY: Moderate   GOALS: Goals reviewed with patient? Yes  SHORT TERM GOALS: Target date: 04/18/2023  Patient will transfer sit to stand smoothly and independently  Baseline: Goal status: depends on the day 9/12  2.  Patient will increase gross strength by 5lbs  Baseline:  Goal status: MET  3.  Patient will improve BERG score by 10  Baseline:  Goal status: MET LONG TERM GOALS: Target date:POC  Patient will go improve balance to CGA for all balance exercises to improve safety  Baseline:  Goal status: INITIAL  2.  Patient will report a 50% reduction in falls  Baseline:  Goal status: INITIAL  3.  Patient will have a full balance program  Baseline:  Goal status: INITIAL 4. Patient will score at least 38/56 on Berg balance scale in order to demonstrate improved balance to reduce the risk for falls at home and in the community.  Baseline:  Goal status: INITIAL   PLAN: PT FREQUENCY: 2x/week  PT DURATION: 8 weeks  PLANNED INTERVENTIONS: Therapeutic exercises, Therapeutic activity, Neuromuscular re-education, Balance training, Gait training, Patient/Family education, Self Care, Joint mobilization, Stair training, Aquatic Therapy, Cryotherapy, Moist heat, Taping, Manual therapy, and Re-evaluation  PLAN FOR NEXT SESSION: work on improving  sit to stand, posterior, dynamic, and static balance. Work on general conditioning. Be aware of leg pain on any given day.    Wyman Songster, PT DPT 10/21/2023, 11:21 AM

## 2023-10-25 ENCOUNTER — Encounter (HOSPITAL_BASED_OUTPATIENT_CLINIC_OR_DEPARTMENT_OTHER): Payer: Self-pay | Admitting: Physical Therapy

## 2023-10-25 ENCOUNTER — Ambulatory Visit (HOSPITAL_BASED_OUTPATIENT_CLINIC_OR_DEPARTMENT_OTHER): Payer: Medicare Other | Admitting: Physical Therapy

## 2023-10-25 DIAGNOSIS — G20A1 Parkinson's disease without dyskinesia, without mention of fluctuations: Secondary | ICD-10-CM | POA: Diagnosis not present

## 2023-10-25 DIAGNOSIS — R2689 Other abnormalities of gait and mobility: Secondary | ICD-10-CM

## 2023-10-25 NOTE — Therapy (Signed)
 OUTPATIENT PHYSICAL THERAPY LOWER EXTREMITY TREATMENT   Patient Name: Morgan [name] MRN: [id number] DOB:1951/08/02, 73 y.o., female Today's Date: 10/25/2023  Progress Note   Reporting Period 08/02/23 to 10/18/23   See note below for Objective Data and Assessment of Progress/Goals     PT End of Session - 10/25/23 1109     Visit Number 36    Number of Visits 72    Date for PT Re-Evaluation 12/13/23    Progress Note Due on Visit 44    PT Start Time 1107    PT Stop Time 1145    PT Time Calculation (min) 38 min    Equipment Utilized During Treatment Gait belt    Activity Tolerance Patient tolerated treatment well    Behavior During Therapy WFL for tasks assessed/performed                    Past Medical History:  Diagnosis Date   Anxiety    Fibromyalgia    Headache    Hypercholesteremia    controlled on medication   Hyperlipidemia    Hypertension    no current meds   LBBB (left bundle branch block)    Melanoma (HCC)    MVP (mitral valve prolapse)    Osteopenia    Parkinson disease (HCC)    Pneumonia    SVD (spontaneous vaginal delivery)    x 2   Past Surgical History:  Procedure Laterality Date   BLEPHAROPLASTY     cold knife conization     COLONOSCOPY     FH colon CA   CYSTOSCOPY W/ URETERAL STENT PLACEMENT Left 10/18/2021   Procedure: CYSTOSCOPY WITH RETROGRADE PYELOGRAM, URETERAL STENT PLACEMENT;  Surgeon: Jerilee Field, MD;  Location: [facility];  Service: Urology;  Laterality: Left;   DILATATION & CURETTAGE/HYSTEROSCOPY WITH MYOSURE N/A 02/14/2018   Procedure: ATTEMPTED DILATATION & CURETTAGE/HYSTEROSCOPY WITH MYOSURE;  Surgeon: Gerald Leitz, MD;  Location: [facility];  Service: Gynecology;  Laterality: N/A;  polyp   melanoma cancer     removed from left leg   RECONSTRUCTION OF EYELID     TONSILLECTOMY     TUBAL LIGATION     URETEROSCOPY WITH HOLMIUM LASER LITHOTRIPSY Left 12/17/2021   Procedure: URETEROSCOPY/HOLMIUM LASER/STENT EXCHANGE;   Surgeon: Jerilee Field, MD;  Location: [facility];  Service: Urology;  Laterality: Left;   WISDOM TOOTH EXTRACTION     Patient Active Problem List   Diagnosis Date Noted   COVID-19 virus infection 05/16/2022   HTN (hypertension) 10/20/2021   Physical deconditioning 10/20/2021   Hydronephrosis with infection 10/18/2021   Bacteremia due to Escherichia coli 10/18/2021   Severe sepsis (HCC) 10/17/2021   UTI (urinary tract infection) 10/17/2021   Acute metabolic encephalopathy 10/17/2021   Parkinson disease (HCC) 10/17/2021   Cervical stenosis (uterine cervix) 02/14/2018   Postmenopausal bleeding 02/14/2018   LBBB (left bundle branch block) 12/25/2015   PVCs (premature ventricular contractions) 12/25/2015   Hyperlipidemia 12/25/2015   Fibromyalgia 01/16/2014   Cholelithiasis 04/25/2013   Paralysis agitans (HCC) 01/15/2013   Hereditary and idiopathic peripheral neuropathy 01/15/2013     [name] MD PCP:   Cindi Carbon PROVIDER: Antoine Primas MD   REFERRING DIAG: Parkinsons   THERAPY DIAG:  Other abnormalities of gait and mobility  Parkinson's disease without dyskinesia or fluctuating manifestations (HCC)  Rationale for Evaluation and Treatment Rehabilitation  ONSET DATE: Several years   SUBJECTIVE:   SUBJECTIVE STATEMENT: Patient states doing alright, no recent falls. Low back sore today.  PERTINENT HISTORY: Fibromyalgia; headaches, LBBB; osteopenia   PAIN:  Are you having pain? no: NPRS scale: 0/10 Location: Pain description:    Aggravating factors: just comes and goes  Relieving factors: just comes and goes; sometimes ibuprofen works but she is limited as to how much she can take  PRECAUTIONS: Patient reports that because of her heart she will pass out at times.   WEIGHT BEARING RESTRICTIONS No  FALLS:  Has patient fallen in last 6 months? Yes.  6 falls but likley more. Last fall    LIVING ENVIRONMENT: Stairs to the second floor in her house but she  dosen't do much  4 steps in the front  2 steps in the garage  OCCUPATION: retired   Presenter, broadcasting: reading; goes to an exercise class   PLOF: Independent with household mobility with device  PATIENT GOALS  To have less pain in her legs and to move better    OBJECTIVE:  (measurements taken at evaluation unless otherwise noted)   DIAGNOSTIC FINDINGS:   PATIENT SURVEYS:  FOTO    COGNITION:  Overall cognitive status: Within functional limits for tasks assessed     SENSATION: Denies paresthesias   POSTURE: No Significant postural limitations  PALPATION: No trigger points noted   LOWER EXTREMITY ROM:  Passive ROM Right eval Left eval  Hip flexion    Hip extension    Hip abduction    Hip adduction    Hip internal rotation    Hip external rotation    Knee flexion    Knee extension    Ankle dorsiflexion    Ankle plantarflexion    Ankle inversion    Ankle eversion     (Blank rows = not tested)  WNL  LOWER EXTREMITY MMT:  MMT Right eval Left eval Right  7/22 Left  7/2 Right  9/12 Left 9/12 Right 06/13/23 Left 06/13/23 Right 08/02/23 Left 08/02/23 Right 10/18/23 Left 10/18/23  Hip flexion 21.7 29.7 23.5 25.9 24.6 25.6 41.3 37.8 47 45.5 44.9 46  Hip extension              Hip abduction 27.6 18.5 19.1 8.7 21.2 14.4 29.9 29.5 35.5 35.3 25.7 35.5  Hip adduction              Hip internal rotation              Hip external rotation              Knee flexion              Knee extension 23.5 20.2 17.7 16.8 22.7 16.3 39.4 49.8 48.8 53.5 48 42.3  Ankle dorsiflexion              Ankle plantarflexion              Ankle inversion              Ankle eversion               (Blank rows = not tested)  GAIT: Decreased hip flexion; leans to the left; right hip frop. Patient required CGA while walking and min a when she turned for balance. No change from IE   Functional tests:  Sit to stand: cuing to catch balance. Uses momentum to stand.    5x sit to stand Balance: 13  sec   9/12 5x sit to stand 13 sec Narrow base min a  Narrow base eyes closed min to mod a  Tandem stance  Min a both ways.  Single leg stance 5 sec bilateral     06/13/23: 285 feet with walking stick, CGA  for safety  Reassessment 08/02/23:  258 feet with walking stick, CGA  for safety 5xSTS: 13.5 seconds with poor eccentric control  Reassessment 10/18/23:  - deferred today due to PT injury 5xSTS: 13.36 seconds with poor eccentric control, posterior lean with transfer/standing  BERG Balance Test          Date:      Sit to Stand 1 2 06/13/23: 2 08/02/23: 3 10/18/23: 3  Standing unsupported 3 3 3 3 3   Sitting with back unsupported but feet supported 1 3 4 4 4   Stand to sit  1 2 3 3 3   Transfers  2 1 2 2 3   Standing unsupported with eyes closed 3 3 3 3 3   Standing unsupported feet together 3 3 3 3 3   From standing position, reach forward with outstretched arm 1 2 3 3 3   From standing position, pick up object from floor 1 2 3 3 3   From standing position, turn and look behind over each shoulder 1 2 2 3 3   Turn 360 2 1 2 2 2   Standing unsupported, alternately place foot on step 1 1 2 2 2   Standing unsupported, one foot in front 1 1 2 2 2   Standing on one leg 0 0 2 1 2   Total:  21/56 26/56 34/56  33/56 34/56      TODAY'S TREATMENT: 10/25/23 STS with overhead reach 3 x 10 Nustep 5 minutes for dynamic warm up and conditioning level 5, seat 7 Lateral step up 4 inch 2 x 20 Seated tricep dips 3 x 10   10/21/23 Nustep 6 minutes for dynamic warm up and conditioning level 5, seat 7 STS with overhead reach 3 x 10 Standing step taps 4 inch step 2 x 20 bilateral Seated shoulder press 4# 3 x 10  Seated tricep dips 3 x 10  LAQ 3# 1 x 10 with 5 second holds Anterior stepping with overhead reach 2 x 15  10/18/23 Reassessment Seated punches 4# 3 x 10 Seated shoulder press 4# 3 x 10  Seated bicep curls 4# 3 x 10  09/06/23 Nustep 6 minutes for dynamic warm up and conditioning level  5, seat 7 Active hamstring stretch 10 x 10 second holds Supine hamstring isometrics 10 x 5 second holds LAQ 3# 1 x 10 second holds Seated shoulder press 4# 3 x 10 Seated tricep dips 3 x 10    12/26 Nustep 6 minutes for dynamic warm up and conditioning level 5, seat 7 Punches 3 lbs 3x10  Seated punches 4# 3 x 10 Seated shoulder press 4# 3 x 10  Seated bicep curls 4# 3 x 10  Air-ex 2x30 seconds EO. Had to go to the bathroom after second set.   Gait: walked into the gym with CGA 30'x2  Walked to the bathroom 75'x2      12/23  Nustep 6 minutes for dynamic warm up and conditioning level 5, seat 7 Punches 3 lbs 3x10  Seated punches 4# 3 x 10 Seated shoulder press 4# 3 x 10  Seated bicep curls 4# 3 x 10  LAQ 3x10 red  March 3x10 red   Narrow base of support air-ex EO and EC 2x30  Heel toe on air-ex x30  Stnaidng march 2x10 on airpex    08/18/23 Nustep 6 minutes for dynamic warm up and conditioning level  5, seat 7 Seated punches 4# 3 x 10 Seated shoulder press 4# 3 x 10  Seated bicep curls 4# 3 x 10 Seated tricep dips 3 x 10  Anterior step with overhead reach 2 x 10 bilateral Lateral step with opening reach 2 x 10 bilateral NBOS on foam with anterior and lateral reaches 2 x 10   08/16/23 Nustep 6 minutes for dynamic warm up and conditioning level 5, seat 7 Seated punches 3# 3 x 10 Seated shoulder press 3# 3 x 10  Seated bicep curls 3# 3 x 10 Seated tricep dips 3 x 10  STS with overhead reach 3 x 10 Standing step taps 4 inch step 2 x 20 bilateral   08/04/23 Nustep 6 minutes for dynamic warm up and conditioning level 5, seat 7 Seated punches 3# 3 x 10 Seated shoulder press 3# 3 x 10  Seated small tricep dips 2 x 10 Seated bicep curls 3# 3 x 10 Standing lunges with UE support 2 x 10 bilateral Shoulder abduction 1# 2 x 10   08/04/23 Nustep 6 minutes for dynamic warm up and conditioning level 5, seat 7 Step up with contralateral knee drive 4 inch 2 x 10  bilateral  Anterior step with overhead reach 2 x 10 bilateral Lateral step with overhead reach 2 x 10 bilateral Shoulder horizontal abduction RTB 2 x 10 Bilateral shoulder ER RTB 2 x 10 Lateral stepping at rail 4 x 15 feet bilateral  Shoulder flexion RTB in hands bilateral 2 x 10   08/02/23 Reassessment  Nustep 6 minutes for dynamic warm up and conditioning level 5, seat 7      PATIENT EDUCATION:  Education details: exercise rationale and technique   Person educated: Patient Education method: Explanation, Demonstration, Tactile cues, Verbal cues, Education comprehension: verbalized understanding, returned demonstration, verbal cues required, tactile cues required, and needs further education   HOME EXERCISE PROGRAM: Access Code: [email or web address]   URL: [email or web address] Date: 06/28/2023 - Seated March with Resistance  - 1 x daily - 7 x weekly - 3 sets - 15 reps - Seated Hip Abduction with Resistance  - 1 x daily - 7 x weekly - 3 sets - 15 reps - Seated Long Arc Quad  - 1 x daily - 7 x weekly - 3 sets - 15 reps - Shoulder External Rotation and Scapular Retraction with Resistance  - 1 x daily - 7 x weekly - 2 sets - 10 reps - Seated Shoulder Horizontal Abduction with Resistance  - 1 x daily - 7 x weekly - 2 sets - 10 reps  08/11/23 - Seated Punches  - 1 x daily - 7 x weekly - 3 sets - 10 reps - Seated Shoulder Overhead Press with Dumbbells with PLB  - 1 x daily - 7 x weekly - 3 sets - 10 reps - Seated Bicep Curls Supinated with Dumbbells  - 1 x daily - 7 x weekly - 3 sets - 10 reps - Seated Shoulder Abduction with Dumbbells - Thumbs Up  - 1 x daily - 7 x weekly - 3 sets - 10 reps  ASSESSMENT:  CLINICAL IMPRESSION: Continued with functional mobility exercises today with intermittent rest breaks for fatigue. CGA for all standing exercises and ambulation for safety. Cueing for improving step length with ambulation. Fatigue 7/10 at end of session. Patient will continue  to benefit from physical therapy in order to improve function and reduce impairment.    OBJECTIVE IMPAIRMENTS Abnormal gait, decreased activity tolerance, decreased  balance, decreased mobility, difficulty walking, decreased strength, impaired tone, and pain.   ACTIVITY LIMITATIONS carrying, lifting, bending, standing, squatting, stairs, transfers, bed mobility, dressing, and locomotion level  PARTICIPATION LIMITATIONS: meal prep, cleaning, laundry, driving, shopping, community activity, and yard work  PERSONAL FACTORS Fibromyalgia; headaches, LBBB; osteopenia  are also affecting patient's functional outcome.   REHAB POTENTIAL: Good  CLINICAL DECISION MAKING: Evolving/moderate complexity increasing pain in her quads   EVALUATION COMPLEXITY: Moderate   GOALS: Goals reviewed with patient? Yes  SHORT TERM GOALS: Target date: 04/18/2023  Patient will transfer sit to stand smoothly and independently  Baseline: Goal status: depends on the day 9/12  2.  Patient will increase gross strength by 5lbs  Baseline:  Goal status: MET  3.  Patient will improve BERG score by 10  Baseline:  Goal status: MET LONG TERM GOALS: Target date:POC  Patient will go improve balance to CGA for all balance exercises to improve safety  Baseline:  Goal status: INITIAL  2.  Patient will report a 50% reduction in falls  Baseline:  Goal status: INITIAL  3.  Patient will have a full balance program  Baseline:  Goal status: INITIAL 4. Patient will score at least 38/56 on Berg balance scale in order to demonstrate improved balance to reduce the risk for falls at home and in the community.  Baseline:  Goal status: INITIAL   PLAN: PT FREQUENCY: 2x/week  PT DURATION: 8 weeks  PLANNED INTERVENTIONS: Therapeutic exercises, Therapeutic activity, Neuromuscular re-education, Balance training, Gait training, Patient/Family education, Self Care, Joint mobilization, Stair training, Aquatic Therapy, Cryotherapy,  Moist heat, Taping, Manual therapy, and Re-evaluation  PLAN FOR NEXT SESSION: work on improving sit to stand, posterior, dynamic, and static balance. Work on general conditioning. Be aware of leg pain on any given day.    Wyman Songster, PT DPT 10/25/2023, 11:47 AM

## 2023-10-27 ENCOUNTER — Encounter (HOSPITAL_BASED_OUTPATIENT_CLINIC_OR_DEPARTMENT_OTHER): Payer: Medicare Other | Admitting: Physical Therapy

## 2023-10-28 ENCOUNTER — Ambulatory Visit (HOSPITAL_BASED_OUTPATIENT_CLINIC_OR_DEPARTMENT_OTHER): Payer: Medicare Other | Attending: Family Medicine | Admitting: Physical Therapy

## 2023-10-28 ENCOUNTER — Encounter (HOSPITAL_BASED_OUTPATIENT_CLINIC_OR_DEPARTMENT_OTHER): Payer: Self-pay | Admitting: Physical Therapy

## 2023-10-28 DIAGNOSIS — G20A1 Parkinson's disease without dyskinesia, without mention of fluctuations: Secondary | ICD-10-CM | POA: Diagnosis not present

## 2023-10-28 DIAGNOSIS — R2689 Other abnormalities of gait and mobility: Secondary | ICD-10-CM | POA: Diagnosis not present

## 2023-10-28 NOTE — Therapy (Signed)
 OUTPATIENT PHYSICAL THERAPY LOWER EXTREMITY TREATMENT   Patient Name: Morgan [name] MRN: [id number] DOB:[birth year], 73 y.o., female Today's Date: 10/28/2023  Progress Note   Reporting Period 08/02/23 to 10/18/23   See note below for Objective Data and Assessment of Progress/Goals     PT End of Session - 10/28/23 1050     Visit Number 37    Number of Visits 72    Date for PT Re-Evaluation 12/13/23    Progress Note Due on Visit 44    PT Start Time 1049    PT Stop Time 1130    PT Time Calculation (min) 41 min    Equipment Utilized During Treatment Gait belt    Activity Tolerance Patient tolerated treatment well    Behavior During Therapy WFL for tasks assessed/performed                    Past Medical History:  Diagnosis Date   Anxiety    Fibromyalgia    Headache    Hypercholesteremia    controlled on medication   Hyperlipidemia    Hypertension    no current meds   LBBB (left bundle branch block)    Melanoma (HCC)    MVP (mitral valve prolapse)    Osteopenia    Parkinson disease (HCC)    Pneumonia    SVD (spontaneous vaginal delivery)    x 2   Past Surgical History:  Procedure Laterality Date   BLEPHAROPLASTY     cold knife conization     COLONOSCOPY     FH colon CA   CYSTOSCOPY W/ URETERAL STENT PLACEMENT Left 10/18/2021   Procedure: CYSTOSCOPY WITH RETROGRADE PYELOGRAM, URETERAL STENT PLACEMENT;  Surgeon: Jerilee Field, MD;  Location: [facility];  Service: Urology;  Laterality: Left;   DILATATION & CURETTAGE/HYSTEROSCOPY WITH MYOSURE N/A 02/14/2018   Procedure: ATTEMPTED DILATATION & CURETTAGE/HYSTEROSCOPY WITH MYOSURE;  Surgeon: Gerald Leitz, MD;  Location: [facility];  Service: Gynecology;  Laterality: N/A;  polyp   melanoma cancer     removed from left leg   RECONSTRUCTION OF EYELID     TONSILLECTOMY     TUBAL LIGATION     URETEROSCOPY WITH HOLMIUM LASER LITHOTRIPSY Left 12/17/2021   Procedure: URETEROSCOPY/HOLMIUM LASER/STENT EXCHANGE;  Surgeon:  Jerilee Field, MD;  Location: [facility];  Service: Urology;  Laterality: Left;   WISDOM TOOTH EXTRACTION     Patient Active Problem List   Diagnosis Date Noted   COVID-19 virus infection 05/16/2022   HTN (hypertension) 10/20/2021   Physical deconditioning 10/20/2021   Hydronephrosis with infection 10/18/2021   Bacteremia due to Escherichia coli 10/18/2021   Severe sepsis (HCC) 10/17/2021   UTI (urinary tract infection) 10/17/2021   Acute metabolic encephalopathy 10/17/2021   Parkinson disease (HCC) 10/17/2021   Cervical stenosis (uterine cervix) 02/14/2018   Postmenopausal bleeding 02/14/2018   LBBB (left bundle branch block) 12/25/2015   PVCs (premature ventricular contractions) 12/25/2015   Hyperlipidemia 12/25/2015   Fibromyalgia 01/16/2014   Cholelithiasis 04/25/2013   Paralysis agitans (HCC) 01/15/2013   Hereditary and idiopathic peripheral neuropathy 01/15/2013     [name] MD PCP:   Cindi Carbon PROVIDER: Antoine Primas MD   REFERRING DIAG: Parkinsons   THERAPY DIAG:  Other abnormalities of gait and mobility  Parkinson's disease without dyskinesia or fluctuating manifestations (HCC)  Rationale for Evaluation and Treatment Rehabilitation  ONSET DATE: Several years   SUBJECTIVE:   SUBJECTIVE STATEMENT: Patient states no falls. Felt alright after last time.    PERTINENT  HISTORY: Fibromyalgia; headaches, LBBB; osteopenia   PAIN:  Are you having pain? no: NPRS scale: 0/10 Location: Pain description:    Aggravating factors: just comes and goes  Relieving factors: just comes and goes; sometimes ibuprofen works but she is limited as to how much she can take  PRECAUTIONS: Patient reports that because of her heart she will pass out at times.   WEIGHT BEARING RESTRICTIONS No  FALLS:  Has patient fallen in last 6 months? Yes.  6 falls but likley more. Last fall    LIVING ENVIRONMENT: Stairs to the second floor in her house but she dosen't do much  4  steps in the front  2 steps in the garage  OCCUPATION: retired   Presenter, broadcasting: reading; goes to an exercise class   PLOF: Independent with household mobility with device  PATIENT GOALS  To have less pain in her legs and to move better    OBJECTIVE:  (measurements taken at evaluation unless otherwise noted)   DIAGNOSTIC FINDINGS:   PATIENT SURVEYS:  FOTO    COGNITION:  Overall cognitive status: Within functional limits for tasks assessed     SENSATION: Denies paresthesias   POSTURE: No Significant postural limitations  PALPATION: No trigger points noted   LOWER EXTREMITY ROM:  Passive ROM Right eval Left eval  Hip flexion    Hip extension    Hip abduction    Hip adduction    Hip internal rotation    Hip external rotation    Knee flexion    Knee extension    Ankle dorsiflexion    Ankle plantarflexion    Ankle inversion    Ankle eversion     (Blank rows = not tested)  WNL  LOWER EXTREMITY MMT:  MMT Right eval Left eval Right  7/22 Left  7/2 Right  9/12 Left 9/12 Right 06/13/23 Left 06/13/23 Right 08/02/23 Left 08/02/23 Right 10/18/23 Left 10/18/23  Hip flexion 21.7 29.7 23.5 25.9 24.6 25.6 41.3 37.8 47 45.5 44.9 46  Hip extension              Hip abduction 27.6 18.5 19.1 8.7 21.2 14.4 29.9 29.5 35.5 35.3 25.7 35.5  Hip adduction              Hip internal rotation              Hip external rotation              Knee flexion              Knee extension 23.5 20.2 17.7 16.8 22.7 16.3 39.4 49.8 48.8 53.5 48 42.3  Ankle dorsiflexion              Ankle plantarflexion              Ankle inversion              Ankle eversion               (Blank rows = not tested)  GAIT: Decreased hip flexion; leans to the left; right hip frop. Patient required CGA while walking and min a when she turned for balance. No change from IE   Functional tests:  Sit to stand: cuing to catch balance. Uses momentum to stand.    5x sit to stand Balance: 13 sec   9/12 5x  sit to stand 13 sec Narrow base min a  Narrow base eyes closed min to mod a  Tandem stance Min  a both ways.  Single leg stance 5 sec bilateral     06/13/23: 285 feet with walking stick, CGA  for safety  Reassessment 08/02/23:  258 feet with walking stick, CGA  for safety 5xSTS: 13.5 seconds with poor eccentric control  Reassessment 10/18/23:  - deferred today due to PT injury 5xSTS: 13.36 seconds with poor eccentric control, posterior lean with transfer/standing  BERG Balance Test          Date:      Sit to Stand 1 2 06/13/23: 2 08/02/23: 3 10/18/23: 3  Standing unsupported 3 3 3 3 3   Sitting with back unsupported but feet supported 1 3 4 4 4   Stand to sit  1 2 3 3 3   Transfers  2 1 2 2 3   Standing unsupported with eyes closed 3 3 3 3 3   Standing unsupported feet together 3 3 3 3 3   From standing position, reach forward with outstretched arm 1 2 3 3 3   From standing position, pick up object from floor 1 2 3 3 3   From standing position, turn and look behind over each shoulder 1 2 2 3 3   Turn 360 2 1 2 2 2   Standing unsupported, alternately place foot on step 1 1 2 2 2   Standing unsupported, one foot in front 1 1 2 2 2   Standing on one leg 0 0 2 1 2   Total:  21/56 26/56 34/56  33/56 34/56      TODAY'S TREATMENT: 10/28/23  Nustep 5 minutes for dynamic warm up and conditioning level 5, seat 7 Hamstring curl machine 30# 2 x 10  Leg press machine 10# 2 x 10  Knee extension 10# 2 x 10 Calf machine 10# 2 x 10 Hip adduction 40# 2 x 15 Hip abduction 40# 2 x 15 Row GTB 3x15 High row GTB 2 x 15  10/25/23 STS with overhead reach 3 x 10 Nustep 5 minutes for dynamic warm up and conditioning level 5, seat 7 Lateral step up 4 inch 2 x 20 Seated tricep dips 3 x 10   10/21/23 Nustep 6 minutes for dynamic warm up and conditioning level 5, seat 7 STS with overhead reach 3 x 10 Standing step taps 4 inch step 2 x 20 bilateral Seated shoulder press 4# 3 x 10  Seated tricep dips 3 x  10  LAQ 3# 1 x 10 with 5 second holds Anterior stepping with overhead reach 2 x 15  10/18/23 Reassessment Seated punches 4# 3 x 10 Seated shoulder press 4# 3 x 10  Seated bicep curls 4# 3 x 10  09/06/23 Nustep 6 minutes for dynamic warm up and conditioning level 5, seat 7 Active hamstring stretch 10 x 10 second holds Supine hamstring isometrics 10 x 5 second holds LAQ 3# 1 x 10 second holds Seated shoulder press 4# 3 x 10 Seated tricep dips 3 x 10    12/26 Nustep 6 minutes for dynamic warm up and conditioning level 5, seat 7 Punches 3 lbs 3x10  Seated punches 4# 3 x 10 Seated shoulder press 4# 3 x 10  Seated bicep curls 4# 3 x 10  Air-ex 2x30 seconds EO. Had to go to the bathroom after second set.   Gait: walked into the gym with CGA 30'x2  Walked to the bathroom 75'x2      12/23  Nustep 6 minutes for dynamic warm up and conditioning level 5, seat 7 Punches 3 lbs 3x10  Seated punches  4# 3 x 10 Seated shoulder press 4# 3 x 10  Seated bicep curls 4# 3 x 10  LAQ 3x10 red  March 3x10 red   Narrow base of support air-ex EO and EC 2x30  Heel toe on air-ex x30  Stnaidng march 2x10 on airpex    08/18/23 Nustep 6 minutes for dynamic warm up and conditioning level 5, seat 7 Seated punches 4# 3 x 10 Seated shoulder press 4# 3 x 10  Seated bicep curls 4# 3 x 10 Seated tricep dips 3 x 10  Anterior step with overhead reach 2 x 10 bilateral Lateral step with opening reach 2 x 10 bilateral NBOS on foam with anterior and lateral reaches 2 x 10   08/16/23 Nustep 6 minutes for dynamic warm up and conditioning level 5, seat 7 Seated punches 3# 3 x 10 Seated shoulder press 3# 3 x 10  Seated bicep curls 3# 3 x 10 Seated tricep dips 3 x 10  STS with overhead reach 3 x 10 Standing step taps 4 inch step 2 x 20 bilateral   08/04/23 Nustep 6 minutes for dynamic warm up and conditioning level 5, seat 7 Seated punches 3# 3 x 10 Seated shoulder press 3# 3 x 10  Seated small  tricep dips 2 x 10 Seated bicep curls 3# 3 x 10 Standing lunges with UE support 2 x 10 bilateral Shoulder abduction 1# 2 x 10   08/04/23 Nustep 6 minutes for dynamic warm up and conditioning level 5, seat 7 Step up with contralateral knee drive 4 inch 2 x 10 bilateral  Anterior step with overhead reach 2 x 10 bilateral Lateral step with overhead reach 2 x 10 bilateral Shoulder horizontal abduction RTB 2 x 10 Bilateral shoulder ER RTB 2 x 10 Lateral stepping at rail 4 x 15 feet bilateral  Shoulder flexion RTB in hands bilateral 2 x 10   08/02/23 Reassessment  Nustep 6 minutes for dynamic warm up and conditioning level 5, seat 7      PATIENT EDUCATION:  Education details: exercise rationale and technique   Person educated: Patient Education method: Explanation, Demonstration, Tactile cues, Verbal cues, Education comprehension: verbalized understanding, returned demonstration, verbal cues required, tactile cues required, and needs further education   HOME EXERCISE PROGRAM: Access Code: [email or web address]   URL: [email or web address] Date: 06/28/2023 - Seated March with Resistance  - 1 x daily - 7 x weekly - 3 sets - 15 reps - Seated Hip Abduction with Resistance  - 1 x daily - 7 x weekly - 3 sets - 15 reps - Seated Long Arc Quad  - 1 x daily - 7 x weekly - 3 sets - 15 reps - Shoulder External Rotation and Scapular Retraction with Resistance  - 1 x daily - 7 x weekly - 2 sets - 10 reps - Seated Shoulder Horizontal Abduction with Resistance  - 1 x daily - 7 x weekly - 2 sets - 10 reps  08/11/23 - Seated Punches  - 1 x daily - 7 x weekly - 3 sets - 10 reps - Seated Shoulder Overhead Press with Dumbbells with PLB  - 1 x daily - 7 x weekly - 3 sets - 10 reps - Seated Bicep Curls Supinated with Dumbbells  - 1 x daily - 7 x weekly - 3 sets - 10 reps - Seated Shoulder Abduction with Dumbbells - Thumbs Up  - 1 x daily - 7 x weekly - 3 sets - 10 reps  ASSESSMENT:  CLINICAL  IMPRESSION: Began strengthening with gym machines today for isolated strength and so patient can complete with family for further improvements in strength. Frequent cueing provided for slow, controlled exercise. CGA provided with ambulation along with cueing for step length and balance. Moderate fatigue at end of session. Patient will continue to benefit from physical therapy in order to improve function and reduce impairment.    OBJECTIVE IMPAIRMENTS Abnormal gait, decreased activity tolerance, decreased balance, decreased mobility, difficulty walking, decreased strength, impaired tone, and pain.   ACTIVITY LIMITATIONS carrying, lifting, bending, standing, squatting, stairs, transfers, bed mobility, dressing, and locomotion level  PARTICIPATION LIMITATIONS: meal prep, cleaning, laundry, driving, shopping, community activity, and yard work  PERSONAL FACTORS Fibromyalgia; headaches, LBBB; osteopenia  are also affecting patient's functional outcome.   REHAB POTENTIAL: Good  CLINICAL DECISION MAKING: Evolving/moderate complexity increasing pain in her quads   EVALUATION COMPLEXITY: Moderate   GOALS: Goals reviewed with patient? Yes  SHORT TERM GOALS: Target date: 04/18/2023  Patient will transfer sit to stand smoothly and independently  Baseline: Goal status: depends on the day 9/12  2.  Patient will increase gross strength by 5lbs  Baseline:  Goal status: MET  3.  Patient will improve BERG score by 10  Baseline:  Goal status: MET LONG TERM GOALS: Target date:POC  Patient will go improve balance to CGA for all balance exercises to improve safety  Baseline:  Goal status: INITIAL  2.  Patient will report a 50% reduction in falls  Baseline:  Goal status: INITIAL  3.  Patient will have a full balance program  Baseline:  Goal status: INITIAL 4. Patient will score at least 38/56 on Berg balance scale in order to demonstrate improved balance to reduce the risk for falls at home and  in the community.  Baseline:  Goal status: INITIAL   PLAN: PT FREQUENCY: 2x/week  PT DURATION: 8 weeks  PLANNED INTERVENTIONS: Therapeutic exercises, Therapeutic activity, Neuromuscular re-education, Balance training, Gait training, Patient/Family education, Self Care, Joint mobilization, Stair training, Aquatic Therapy, Cryotherapy, Moist heat, Taping, Manual therapy, and Re-evaluation  PLAN FOR NEXT SESSION: work on improving sit to stand, posterior, dynamic, and static balance. Work on general conditioning. Be aware of leg pain on any given day.    Wyman Songster, PT DPT 10/28/2023, 10:51 AM

## 2023-11-01 ENCOUNTER — Ambulatory Visit (HOSPITAL_BASED_OUTPATIENT_CLINIC_OR_DEPARTMENT_OTHER): Payer: Medicare Other | Admitting: Physical Therapy

## 2023-11-01 ENCOUNTER — Encounter (HOSPITAL_BASED_OUTPATIENT_CLINIC_OR_DEPARTMENT_OTHER): Payer: Self-pay | Admitting: Physical Therapy

## 2023-11-01 DIAGNOSIS — R2689 Other abnormalities of gait and mobility: Secondary | ICD-10-CM

## 2023-11-01 DIAGNOSIS — G20A1 Parkinson's disease without dyskinesia, without mention of fluctuations: Secondary | ICD-10-CM | POA: Diagnosis not present

## 2023-11-01 NOTE — Therapy (Signed)
 OUTPATIENT PHYSICAL THERAPY LOWER EXTREMITY TREATMENT   Patient Name: Morgan [name] MRN: [id number] DOB:Aug 11, 1951, 73 y.o., female Today's Date: 11/01/2023  Progress Note   Reporting Period 08/02/23 to 10/18/23   See note below for Objective Data and Assessment of Progress/Goals     PT End of Session - 11/01/23 1103     Visit Number 38    Number of Visits 72    Date for PT Re-Evaluation 12/13/23    Progress Note Due on Visit 44    PT Start Time 1103    PT Stop Time 1137    PT Time Calculation (min) 34 min    Equipment Utilized During Treatment Gait belt    Activity Tolerance Patient tolerated treatment well    Behavior During Therapy WFL for tasks assessed/performed                    Past Medical History:  Diagnosis Date   Anxiety    Fibromyalgia    Headache    Hypercholesteremia    controlled on medication   Hyperlipidemia    Hypertension    no current meds   LBBB (left bundle branch block)    Melanoma (HCC)    MVP (mitral valve prolapse)    Osteopenia    Parkinson disease (HCC)    Pneumonia    SVD (spontaneous vaginal delivery)    x 2   Past Surgical History:  Procedure Laterality Date   BLEPHAROPLASTY     cold knife conization     COLONOSCOPY     FH colon CA   CYSTOSCOPY W/ URETERAL STENT PLACEMENT Left 10/18/2021   Procedure: CYSTOSCOPY WITH RETROGRADE PYELOGRAM, URETERAL STENT PLACEMENT;  Surgeon: Jerilee Field, MD;  Location: [facility];  Service: Urology;  Laterality: Left;   DILATATION & CURETTAGE/HYSTEROSCOPY WITH MYOSURE N/A 02/14/2018   Procedure: ATTEMPTED DILATATION & CURETTAGE/HYSTEROSCOPY WITH MYOSURE;  Surgeon: Gerald Leitz, MD;  Location: [facility];  Service: Gynecology;  Laterality: N/A;  polyp   melanoma cancer     removed from left leg   RECONSTRUCTION OF EYELID     TONSILLECTOMY     TUBAL LIGATION     URETEROSCOPY WITH HOLMIUM LASER LITHOTRIPSY Left 12/17/2021   Procedure: URETEROSCOPY/HOLMIUM LASER/STENT EXCHANGE;  Surgeon:  Jerilee Field, MD;  Location: [facility];  Service: Urology;  Laterality: Left;   WISDOM TOOTH EXTRACTION     Patient Active Problem List   Diagnosis Date Noted   COVID-19 virus infection 05/16/2022   HTN (hypertension) 10/20/2021   Physical deconditioning 10/20/2021   Hydronephrosis with infection 10/18/2021   Bacteremia due to Escherichia coli 10/18/2021   Severe sepsis (HCC) 10/17/2021   UTI (urinary tract infection) 10/17/2021   Acute metabolic encephalopathy 10/17/2021   Parkinson disease (HCC) 10/17/2021   Cervical stenosis (uterine cervix) 02/14/2018   Postmenopausal bleeding 02/14/2018   LBBB (left bundle branch block) 12/25/2015   PVCs (premature ventricular contractions) 12/25/2015   Hyperlipidemia 12/25/2015   Fibromyalgia 01/16/2014   Cholelithiasis 04/25/2013   Paralysis agitans (HCC) 01/15/2013   Hereditary and idiopathic peripheral neuropathy 01/15/2013     [name] MD PCP:   Cindi Carbon PROVIDER: Antoine Primas MD   REFERRING DIAG: Parkinsons   THERAPY DIAG:  Other abnormalities of gait and mobility  Parkinson's disease without dyskinesia or fluctuating manifestations (HCC)  Rationale for Evaluation and Treatment Rehabilitation  ONSET DATE: Several years   SUBJECTIVE:   SUBJECTIVE STATEMENT: Patient states doesn't feel great today. Likely due to the weather.  PERTINENT HISTORY: Fibromyalgia; headaches, LBBB; osteopenia   PAIN:  Are you having pain? no: NPRS scale: 0/10 Location: Pain description:    Aggravating factors: just comes and goes  Relieving factors: just comes and goes; sometimes ibuprofen works but she is limited as to how much she can take  PRECAUTIONS: Patient reports that because of her heart she will pass out at times.   WEIGHT BEARING RESTRICTIONS No  FALLS:  Has patient fallen in last 6 months? Yes.  6 falls but likley more. Last fall    LIVING ENVIRONMENT: Stairs to the second floor in her house but she dosen't  do much  4 steps in the front  2 steps in the garage  OCCUPATION: retired   Presenter, broadcasting: reading; goes to an exercise class   PLOF: Independent with household mobility with device  PATIENT GOALS  To have less pain in her legs and to move better    OBJECTIVE:  (measurements taken at evaluation unless otherwise noted)   DIAGNOSTIC FINDINGS:   PATIENT SURVEYS:  FOTO    COGNITION:  Overall cognitive status: Within functional limits for tasks assessed     SENSATION: Denies paresthesias   POSTURE: No Significant postural limitations  PALPATION: No trigger points noted   LOWER EXTREMITY ROM:  Passive ROM Right eval Left eval  Hip flexion    Hip extension    Hip abduction    Hip adduction    Hip internal rotation    Hip external rotation    Knee flexion    Knee extension    Ankle dorsiflexion    Ankle plantarflexion    Ankle inversion    Ankle eversion     (Blank rows = not tested)  WNL  LOWER EXTREMITY MMT:  MMT Right eval Left eval Right  7/22 Left  7/2 Right  9/12 Left 9/12 Right 06/13/23 Left 06/13/23 Right 08/02/23 Left 08/02/23 Right 10/18/23 Left 10/18/23  Hip flexion 21.7 29.7 23.5 25.9 24.6 25.6 41.3 37.8 47 45.5 44.9 46  Hip extension              Hip abduction 27.6 18.5 19.1 8.7 21.2 14.4 29.9 29.5 35.5 35.3 25.7 35.5  Hip adduction              Hip internal rotation              Hip external rotation              Knee flexion              Knee extension 23.5 20.2 17.7 16.8 22.7 16.3 39.4 49.8 48.8 53.5 48 42.3  Ankle dorsiflexion              Ankle plantarflexion              Ankle inversion              Ankle eversion               (Blank rows = not tested)  GAIT: Decreased hip flexion; leans to the left; right hip frop. Patient required CGA while walking and min a when she turned for balance. No change from IE   Functional tests:  Sit to stand: cuing to catch balance. Uses momentum to stand.    5x sit to stand Balance: 13 sec    9/12 5x sit to stand 13 sec Narrow base min a  Narrow base eyes closed min to mod a  Tandem stance  Min a both ways.  Single leg stance 5 sec bilateral     06/13/23: 285 feet with walking stick, CGA  for safety  Reassessment 08/02/23:  258 feet with walking stick, CGA  for safety 5xSTS: 13.5 seconds with poor eccentric control  Reassessment 10/18/23:  - deferred today due to PT injury 5xSTS: 13.36 seconds with poor eccentric control, posterior lean with transfer/standing  BERG Balance Test          Date:      Sit to Stand 1 2 06/13/23: 2 08/02/23: 3 10/18/23: 3  Standing unsupported 3 3 3 3 3   Sitting with back unsupported but feet supported 1 3 4 4 4   Stand to sit  1 2 3 3 3   Transfers  2 1 2 2 3   Standing unsupported with eyes closed 3 3 3 3 3   Standing unsupported feet together 3 3 3 3 3   From standing position, reach forward with outstretched arm 1 2 3 3 3   From standing position, pick up object from floor 1 2 3 3 3   From standing position, turn and look behind over each shoulder 1 2 2 3 3   Turn 360 2 1 2 2 2   Standing unsupported, alternately place foot on step 1 1 2 2 2   Standing unsupported, one foot in front 1 1 2 2 2   Standing on one leg 0 0 2 1 2   Total:  21/56 26/56 34/56  33/56 34/56      TODAY'S TREATMENT: 11/01/23  Nustep 5 minutes for dynamic warm up and conditioning level 5, seat 7   10/28/23  Nustep 5 minutes for dynamic warm up and conditioning level 5, seat 7 Hamstring curl machine 30# 2 x 10  Leg press machine 10# 2 x 10  Knee extension 10# 2 x 10 Calf machine 10# 2 x 10 Hip adduction 40# 2 x 15 Hip abduction 40# 2 x 15 Row GTB 3x15 High row GTB 2 x 15  10/25/23 STS with overhead reach 3 x 10 Nustep 5 minutes for dynamic warm up and conditioning level 5, seat 7 Lateral step up 4 inch 2 x 20 Seated tricep dips 3 x 10   10/21/23 Nustep 6 minutes for dynamic warm up and conditioning level 5, seat 7 STS with overhead reach 3 x 10 Standing  step taps 4 inch step 2 x 20 bilateral Seated shoulder press 4# 3 x 10  Seated tricep dips 3 x 10  LAQ 3# 1 x 10 with 5 second holds Anterior stepping with overhead reach 2 x 15  10/18/23 Reassessment Seated punches 4# 3 x 10 Seated shoulder press 4# 3 x 10  Seated bicep curls 4# 3 x 10  09/06/23 Nustep 6 minutes for dynamic warm up and conditioning level 5, seat 7 Active hamstring stretch 10 x 10 second holds Supine hamstring isometrics 10 x 5 second holds LAQ 3# 1 x 10 second holds Seated shoulder press 4# 3 x 10 Seated tricep dips 3 x 10    12/26 Nustep 6 minutes for dynamic warm up and conditioning level 5, seat 7 Punches 3 lbs 3x10  Seated punches 4# 3 x 10 Seated shoulder press 4# 3 x 10  Seated bicep curls 4# 3 x 10  Air-ex 2x30 seconds EO. Had to go to the bathroom after second set.   Gait: walked into the gym with CGA 30'x2  Walked to the bathroom 75'x2      12/23  Nustep 6  minutes for dynamic warm up and conditioning level 5, seat 7 Punches 3 lbs 3x10  Seated punches 4# 3 x 10 Seated shoulder press 4# 3 x 10  Seated bicep curls 4# 3 x 10  LAQ 3x10 red  March 3x10 red   Narrow base of support air-ex EO and EC 2x30  Heel toe on air-ex x30  Stnaidng march 2x10 on airpex    08/18/23 Nustep 6 minutes for dynamic warm up and conditioning level 5, seat 7 Seated punches 4# 3 x 10 Seated shoulder press 4# 3 x 10  Seated bicep curls 4# 3 x 10 Seated tricep dips 3 x 10  Anterior step with overhead reach 2 x 10 bilateral Lateral step with opening reach 2 x 10 bilateral NBOS on foam with anterior and lateral reaches 2 x 10   08/16/23 Nustep 6 minutes for dynamic warm up and conditioning level 5, seat 7 Seated punches 3# 3 x 10 Seated shoulder press 3# 3 x 10  Seated bicep curls 3# 3 x 10 Seated tricep dips 3 x 10  STS with overhead reach 3 x 10 Standing step taps 4 inch step 2 x 20 bilateral   08/04/23 Nustep 6 minutes for dynamic warm up and  conditioning level 5, seat 7 Seated punches 3# 3 x 10 Seated shoulder press 3# 3 x 10  Seated small tricep dips 2 x 10 Seated bicep curls 3# 3 x 10 Standing lunges with UE support 2 x 10 bilateral Shoulder abduction 1# 2 x 10   08/04/23 Nustep 6 minutes for dynamic warm up and conditioning level 5, seat 7 Step up with contralateral knee drive 4 inch 2 x 10 bilateral  Anterior step with overhead reach 2 x 10 bilateral Lateral step with overhead reach 2 x 10 bilateral Shoulder horizontal abduction RTB 2 x 10 Bilateral shoulder ER RTB 2 x 10 Lateral stepping at rail 4 x 15 feet bilateral  Shoulder flexion RTB in hands bilateral 2 x 10   08/02/23 Reassessment  Nustep 6 minutes for dynamic warm up and conditioning level 5, seat 7      PATIENT EDUCATION:  Education details: exercise rationale and technique   Person educated: Patient Education method: Explanation, Demonstration, Tactile cues, Verbal cues, Education comprehension: verbalized understanding, returned demonstration, verbal cues required, tactile cues required, and needs further education   HOME EXERCISE PROGRAM: Access Code: [email or web address]   URL: [email or web address] Date: 06/28/2023 - Seated March with Resistance  - 1 x daily - 7 x weekly - 3 sets - 15 reps - Seated Hip Abduction with Resistance  - 1 x daily - 7 x weekly - 3 sets - 15 reps - Seated Long Arc Quad  - 1 x daily - 7 x weekly - 3 sets - 15 reps - Shoulder External Rotation and Scapular Retraction with Resistance  - 1 x daily - 7 x weekly - 2 sets - 10 reps - Seated Shoulder Horizontal Abduction with Resistance  - 1 x daily - 7 x weekly - 2 sets - 10 reps  08/11/23 - Seated Punches  - 1 x daily - 7 x weekly - 3 sets - 10 reps - Seated Shoulder Overhead Press with Dumbbells with PLB  - 1 x daily - 7 x weekly - 3 sets - 10 reps - Seated Bicep Curls Supinated with Dumbbells  - 1 x daily - 7 x weekly - 3 sets - 10 reps - Seated Shoulder Abduction  with Dumbbells - Thumbs  Up  - 1 x daily - 7 x weekly - 3 sets - 10 reps  ASSESSMENT:  CLINICAL IMPRESSION: Patient with increased symptoms and fatigue today causing severe mobility impairment. Frequent cueing provided with limited carry over. Patient with limited sitting ability and postural control with posterior lean. Patient assisted to supine and notified husband of performance and ended session early due to limitation. Patient will continue to benefit from physical therapy in order to improve function and reduce impairment.    OBJECTIVE IMPAIRMENTS Abnormal gait, decreased activity tolerance, decreased balance, decreased mobility, difficulty walking, decreased strength, impaired tone, and pain.   ACTIVITY LIMITATIONS carrying, lifting, bending, standing, squatting, stairs, transfers, bed mobility, dressing, and locomotion level  PARTICIPATION LIMITATIONS: meal prep, cleaning, laundry, driving, shopping, community activity, and yard work  PERSONAL FACTORS Fibromyalgia; headaches, LBBB; osteopenia  are also affecting patient's functional outcome.   REHAB POTENTIAL: Good  CLINICAL DECISION MAKING: Evolving/moderate complexity increasing pain in her quads   EVALUATION COMPLEXITY: Moderate   GOALS: Goals reviewed with patient? Yes  SHORT TERM GOALS: Target date: 04/18/2023  Patient will transfer sit to stand smoothly and independently  Baseline: Goal status: depends on the day 9/12  2.  Patient will increase gross strength by 5lbs  Baseline:  Goal status: MET  3.  Patient will improve BERG score by 10  Baseline:  Goal status: MET LONG TERM GOALS: Target date:POC  Patient will go improve balance to CGA for all balance exercises to improve safety  Baseline:  Goal status: INITIAL  2.  Patient will report a 50% reduction in falls  Baseline:  Goal status: INITIAL  3.  Patient will have a full balance program  Baseline:  Goal status: INITIAL 4. Patient will score at least  38/56 on Berg balance scale in order to demonstrate improved balance to reduce the risk for falls at home and in the community.  Baseline:  Goal status: INITIAL   PLAN: PT FREQUENCY: 2x/week  PT DURATION: 8 weeks  PLANNED INTERVENTIONS: Therapeutic exercises, Therapeutic activity, Neuromuscular re-education, Balance training, Gait training, Patient/Family education, Self Care, Joint mobilization, Stair training, Aquatic Therapy, Cryotherapy, Moist heat, Taping, Manual therapy, and Re-evaluation  PLAN FOR NEXT SESSION: work on improving sit to stand, posterior, dynamic, and static balance. Work on general conditioning. Be aware of leg pain on any given day.    Wyman Songster, PT DPT 11/01/2023, 11:45 AM

## 2023-11-04 ENCOUNTER — Encounter (HOSPITAL_BASED_OUTPATIENT_CLINIC_OR_DEPARTMENT_OTHER): Payer: Self-pay | Admitting: Physical Therapy

## 2023-11-04 ENCOUNTER — Ambulatory Visit (HOSPITAL_BASED_OUTPATIENT_CLINIC_OR_DEPARTMENT_OTHER): Payer: Medicare Other | Admitting: Physical Therapy

## 2023-11-04 DIAGNOSIS — G20A1 Parkinson's disease without dyskinesia, without mention of fluctuations: Secondary | ICD-10-CM | POA: Diagnosis not present

## 2023-11-04 DIAGNOSIS — R2689 Other abnormalities of gait and mobility: Secondary | ICD-10-CM

## 2023-11-04 NOTE — Therapy (Signed)
 OUTPATIENT PHYSICAL THERAPY LOWER EXTREMITY TREATMENT   Patient Name: Morgan [name] MRN: [id number] DOB:12-08-1950, 73 y.o., female Today's Date: 11/04/2023      PT End of Session - 11/04/23 1049     Visit Number 39    Number of Visits 72    Date for PT Re-Evaluation 12/13/23    Progress Note Due on Visit 44    PT Start Time 1049    PT Stop Time 1129    PT Time Calculation (min) 40 min    Equipment Utilized During Treatment Gait belt    Activity Tolerance Patient tolerated treatment well    Behavior During Therapy WFL for tasks assessed/performed                    Past Medical History:  Diagnosis Date   Anxiety    Fibromyalgia    Headache    Hypercholesteremia    controlled on medication   Hyperlipidemia    Hypertension    no current meds   LBBB (left bundle branch block)    Melanoma (HCC)    MVP (mitral valve prolapse)    Osteopenia    Parkinson disease (HCC)    Pneumonia    SVD (spontaneous vaginal delivery)    x 2   Past Surgical History:  Procedure Laterality Date   BLEPHAROPLASTY     cold knife conization     COLONOSCOPY     FH colon CA   CYSTOSCOPY W/ URETERAL STENT PLACEMENT Left 10/18/2021   Procedure: CYSTOSCOPY WITH RETROGRADE PYELOGRAM, URETERAL STENT PLACEMENT;  Surgeon: Jerilee Field, MD;  Location: [facility];  Service: Urology;  Laterality: Left;   DILATATION & CURETTAGE/HYSTEROSCOPY WITH MYOSURE N/A 02/14/2018   Procedure: ATTEMPTED DILATATION & CURETTAGE/HYSTEROSCOPY WITH MYOSURE;  Surgeon: Gerald Leitz, MD;  Location: [facility];  Service: Gynecology;  Laterality: N/A;  polyp   melanoma cancer     removed from left leg   RECONSTRUCTION OF EYELID     TONSILLECTOMY     TUBAL LIGATION     URETEROSCOPY WITH HOLMIUM LASER LITHOTRIPSY Left 12/17/2021   Procedure: URETEROSCOPY/HOLMIUM LASER/STENT EXCHANGE;  Surgeon: Jerilee Field, MD;  Location: [facility];  Service: Urology;  Laterality: Left;   WISDOM TOOTH EXTRACTION     Patient  Active Problem List   Diagnosis Date Noted   COVID-19 virus infection 05/16/2022   HTN (hypertension) 10/20/2021   Physical deconditioning 10/20/2021   Hydronephrosis with infection 10/18/2021   Bacteremia due to Escherichia coli 10/18/2021   Severe sepsis (HCC) 10/17/2021   UTI (urinary tract infection) 10/17/2021   Acute metabolic encephalopathy 10/17/2021   Parkinson disease (HCC) 10/17/2021   Cervical stenosis (uterine cervix) 02/14/2018   Postmenopausal bleeding 02/14/2018   LBBB (left bundle branch block) 12/25/2015   PVCs (premature ventricular contractions) 12/25/2015   Hyperlipidemia 12/25/2015   Fibromyalgia 01/16/2014   Cholelithiasis 04/25/2013   Paralysis agitans (HCC) 01/15/2013   Hereditary and idiopathic peripheral neuropathy 01/15/2013     [name] MD PCP:   Cindi Carbon PROVIDER: Antoine Primas MD   REFERRING DIAG: Parkinsons   THERAPY DIAG:  Other abnormalities of gait and mobility  Parkinson's disease without dyskinesia or fluctuating manifestations (HCC)  Rationale for Evaluation and Treatment Rehabilitation  ONSET DATE: Several years   SUBJECTIVE:   SUBJECTIVE STATEMENT: Patient states feeling better today. Took a rest after last session.   PERTINENT HISTORY: Fibromyalgia; headaches, LBBB; osteopenia   PAIN:  Are you having pain? no: NPRS scale: 0/10 Location: Pain description:  Aggravating factors: just comes and goes  Relieving factors: just comes and goes; sometimes ibuprofen works but she is limited as to how much she can take  PRECAUTIONS: Patient reports that because of her heart she will pass out at times.   WEIGHT BEARING RESTRICTIONS No  FALLS:  Has patient fallen in last 6 months? Yes.  6 falls but likley more. Last fall    LIVING ENVIRONMENT: Stairs to the second floor in her house but she dosen't do much  4 steps in the front  2 steps in the garage  OCCUPATION: retired   Presenter, broadcasting: reading; goes to an exercise  class   PLOF: Independent with household mobility with device  PATIENT GOALS  To have less pain in her legs and to move better    OBJECTIVE:  (measurements taken at evaluation unless otherwise noted)   DIAGNOSTIC FINDINGS:   PATIENT SURVEYS:  FOTO    COGNITION:  Overall cognitive status: Within functional limits for tasks assessed     SENSATION: Denies paresthesias   POSTURE: No Significant postural limitations  PALPATION: No trigger points noted   LOWER EXTREMITY ROM:  Passive ROM Right eval Left eval  Hip flexion    Hip extension    Hip abduction    Hip adduction    Hip internal rotation    Hip external rotation    Knee flexion    Knee extension    Ankle dorsiflexion    Ankle plantarflexion    Ankle inversion    Ankle eversion     (Blank rows = not tested)  WNL  LOWER EXTREMITY MMT:  MMT Right eval Left eval Right  7/22 Left  7/2 Right  9/12 Left 9/12 Right 06/13/23 Left 06/13/23 Right 08/02/23 Left 08/02/23 Right 10/18/23 Left 10/18/23  Hip flexion 21.7 29.7 23.5 25.9 24.6 25.6 41.3 37.8 47 45.5 44.9 46  Hip extension              Hip abduction 27.6 18.5 19.1 8.7 21.2 14.4 29.9 29.5 35.5 35.3 25.7 35.5  Hip adduction              Hip internal rotation              Hip external rotation              Knee flexion              Knee extension 23.5 20.2 17.7 16.8 22.7 16.3 39.4 49.8 48.8 53.5 48 42.3  Ankle dorsiflexion              Ankle plantarflexion              Ankle inversion              Ankle eversion               (Blank rows = not tested)  GAIT: Decreased hip flexion; leans to the left; right hip frop. Patient required CGA while walking and min a when she turned for balance. No change from IE   Functional tests:  Sit to stand: cuing to catch balance. Uses momentum to stand.    5x sit to stand Balance: 13 sec   9/12 5x sit to stand 13 sec Narrow base min a  Narrow base eyes closed min to mod a  Tandem stance Min a both  ways.  Single leg stance 5 sec bilateral     06/13/23: 285 feet with walking stick, CGA  for  safety  Reassessment 08/02/23:  258 feet with walking stick, CGA  for safety 5xSTS: 13.5 seconds with poor eccentric control  Reassessment 10/18/23:  - deferred today due to PT injury 5xSTS: 13.36 seconds with poor eccentric control, posterior lean with transfer/standing  BERG Balance Test          Date:      Sit to Stand 1 2 06/13/23: 2 08/02/23: 3 10/18/23: 3  Standing unsupported 3 3 3 3 3   Sitting with back unsupported but feet supported 1 3 4 4 4   Stand to sit  1 2 3 3 3   Transfers  2 1 2 2 3   Standing unsupported with eyes closed 3 3 3 3 3   Standing unsupported feet together 3 3 3 3 3   From standing position, reach forward with outstretched arm 1 2 3 3 3   From standing position, pick up object from floor 1 2 3 3 3   From standing position, turn and look behind over each shoulder 1 2 2 3 3   Turn 360 2 1 2 2 2   Standing unsupported, alternately place foot on step 1 1 2 2 2   Standing unsupported, one foot in front 1 1 2 2 2   Standing on one leg 0 0 2 1 2   Total:  21/56 26/56 34/56  33/56 34/56      TODAY'S TREATMENT: 11/04/23 Nustep 5 minutes for dynamic warm up and conditioning level 5, seat 7 Hamstring curl machine 30# 2 x 10  Leg press machine 10# 2 x 10  Knee extension 10# 2 x 10 Hip adduction 40# 3 x 15 Hip abduction 40# 3 x 15  11/01/23  Nustep 5 minutes for dynamic warm up and conditioning level 5, seat 7   10/28/23  Nustep 5 minutes for dynamic warm up and conditioning level 5, seat 7 Hamstring curl machine 30# 2 x 10  Leg press machine 10# 2 x 10  Knee extension 10# 2 x 10 Calf machine 10# 2 x 10 Hip adduction 40# 2 x 15 Hip abduction 40# 2 x 15 Row GTB 3x15 High row GTB 2 x 15  10/25/23 STS with overhead reach 3 x 10 Nustep 5 minutes for dynamic warm up and conditioning level 5, seat 7 Lateral step up 4 inch 2 x 20 Seated tricep dips 3 x 10    10/21/23 Nustep 6 minutes for dynamic warm up and conditioning level 5, seat 7 STS with overhead reach 3 x 10 Standing step taps 4 inch step 2 x 20 bilateral Seated shoulder press 4# 3 x 10  Seated tricep dips 3 x 10  LAQ 3# 1 x 10 with 5 second holds Anterior stepping with overhead reach 2 x 15  10/18/23 Reassessment Seated punches 4# 3 x 10 Seated shoulder press 4# 3 x 10  Seated bicep curls 4# 3 x 10  09/06/23 Nustep 6 minutes for dynamic warm up and conditioning level 5, seat 7 Active hamstring stretch 10 x 10 second holds Supine hamstring isometrics 10 x 5 second holds LAQ 3# 1 x 10 second holds Seated shoulder press 4# 3 x 10 Seated tricep dips 3 x 10    12/26 Nustep 6 minutes for dynamic warm up and conditioning level 5, seat 7 Punches 3 lbs 3x10  Seated punches 4# 3 x 10 Seated shoulder press 4# 3 x 10  Seated bicep curls 4# 3 x 10  Air-ex 2x30 seconds EO. Had to go to the bathroom after second set.  Gait: walked into the gym with CGA 30'x2  Walked to the bathroom 75'x2      12/23  Nustep 6 minutes for dynamic warm up and conditioning level 5, seat 7 Punches 3 lbs 3x10  Seated punches 4# 3 x 10 Seated shoulder press 4# 3 x 10  Seated bicep curls 4# 3 x 10  LAQ 3x10 red  March 3x10 red   Narrow base of support air-ex EO and EC 2x30  Heel toe on air-ex x30  Stnaidng march 2x10 on airpex    08/18/23 Nustep 6 minutes for dynamic warm up and conditioning level 5, seat 7 Seated punches 4# 3 x 10 Seated shoulder press 4# 3 x 10  Seated bicep curls 4# 3 x 10 Seated tricep dips 3 x 10  Anterior step with overhead reach 2 x 10 bilateral Lateral step with opening reach 2 x 10 bilateral NBOS on foam with anterior and lateral reaches 2 x 10   08/16/23 Nustep 6 minutes for dynamic warm up and conditioning level 5, seat 7 Seated punches 3# 3 x 10 Seated shoulder press 3# 3 x 10  Seated bicep curls 3# 3 x 10 Seated tricep dips 3 x 10  STS with overhead  reach 3 x 10 Standing step taps 4 inch step 2 x 20 bilateral   08/04/23 Nustep 6 minutes for dynamic warm up and conditioning level 5, seat 7 Seated punches 3# 3 x 10 Seated shoulder press 3# 3 x 10  Seated small tricep dips 2 x 10 Seated bicep curls 3# 3 x 10 Standing lunges with UE support 2 x 10 bilateral Shoulder abduction 1# 2 x 10   PATIENT EDUCATION:  Education details: exercise rationale and technique   Person educated: Patient Education method: Explanation, Demonstration, Tactile cues, Verbal cues, Education comprehension: verbalized understanding, returned demonstration, verbal cues required, tactile cues required, and needs further education   HOME EXERCISE PROGRAM: Access Code: [email or web address]   URL: [email or web address] Date: 06/28/2023 - Seated March with Resistance  - 1 x daily - 7 x weekly - 3 sets - 15 reps - Seated Hip Abduction with Resistance  - 1 x daily - 7 x weekly - 3 sets - 15 reps - Seated Long Arc Quad  - 1 x daily - 7 x weekly - 3 sets - 15 reps - Shoulder External Rotation and Scapular Retraction with Resistance  - 1 x daily - 7 x weekly - 2 sets - 10 reps - Seated Shoulder Horizontal Abduction with Resistance  - 1 x daily - 7 x weekly - 2 sets - 10 reps  08/11/23 - Seated Punches  - 1 x daily - 7 x weekly - 3 sets - 10 reps - Seated Shoulder Overhead Press with Dumbbells with PLB  - 1 x daily - 7 x weekly - 3 sets - 10 reps - Seated Bicep Curls Supinated with Dumbbells  - 1 x daily - 7 x weekly - 3 sets - 10 reps - Seated Shoulder Abduction with Dumbbells - Thumbs Up  - 1 x daily - 7 x weekly - 3 sets - 10 reps  ASSESSMENT:  CLINICAL IMPRESSION: Patient improved performance today with ambulation and activity tolerance. Performance of gym strengthening exercises today for review and able to complete some additional reps. CGA and cueing for gait mechanics with transition to different machines provided. Frequent cueing needed for controlled  exercise performance. Patient will continue to benefit from physical therapy in order to improve function and  reduce impairment.    OBJECTIVE IMPAIRMENTS Abnormal gait, decreased activity tolerance, decreased balance, decreased mobility, difficulty walking, decreased strength, impaired tone, and pain.   ACTIVITY LIMITATIONS carrying, lifting, bending, standing, squatting, stairs, transfers, bed mobility, dressing, and locomotion level  PARTICIPATION LIMITATIONS: meal prep, cleaning, laundry, driving, shopping, community activity, and yard work  PERSONAL FACTORS Fibromyalgia; headaches, LBBB; osteopenia  are also affecting patient's functional outcome.   REHAB POTENTIAL: Good  CLINICAL DECISION MAKING: Evolving/moderate complexity increasing pain in her quads   EVALUATION COMPLEXITY: Moderate   GOALS: Goals reviewed with patient? Yes  SHORT TERM GOALS: Target date: 04/18/2023  Patient will transfer sit to stand smoothly and independently  Baseline: Goal status: depends on the day 9/12  2.  Patient will increase gross strength by 5lbs  Baseline:  Goal status: MET  3.  Patient will improve BERG score by 10  Baseline:  Goal status: MET LONG TERM GOALS: Target date:POC  Patient will go improve balance to CGA for all balance exercises to improve safety  Baseline:  Goal status: INITIAL  2.  Patient will report a 50% reduction in falls  Baseline:  Goal status: INITIAL  3.  Patient will have a full balance program  Baseline:  Goal status: INITIAL 4. Patient will score at least 38/56 on Berg balance scale in order to demonstrate improved balance to reduce the risk for falls at home and in the community.  Baseline:  Goal status: INITIAL   PLAN: PT FREQUENCY: 2x/week  PT DURATION: 8 weeks  PLANNED INTERVENTIONS: Therapeutic exercises, Therapeutic activity, Neuromuscular re-education, Balance training, Gait training, Patient/Family education, Self Care, Joint mobilization,  Stair training, Aquatic Therapy, Cryotherapy, Moist heat, Taping, Manual therapy, and Re-evaluation  PLAN FOR NEXT SESSION: work on improving sit to stand, posterior, dynamic, and static balance. Work on general conditioning. Be aware of leg pain on any given day.    Wyman Songster, PT DPT 11/04/2023, 10:50 AM

## 2023-11-07 DIAGNOSIS — R413 Other amnesia: Secondary | ICD-10-CM | POA: Diagnosis not present

## 2023-11-07 DIAGNOSIS — G20B2 Parkinson's disease with dyskinesia, with fluctuations: Secondary | ICD-10-CM | POA: Diagnosis not present

## 2023-11-08 ENCOUNTER — Ambulatory Visit (HOSPITAL_BASED_OUTPATIENT_CLINIC_OR_DEPARTMENT_OTHER): Payer: Medicare Other | Admitting: Physical Therapy

## 2023-11-08 ENCOUNTER — Encounter (HOSPITAL_BASED_OUTPATIENT_CLINIC_OR_DEPARTMENT_OTHER): Payer: Self-pay | Admitting: Physical Therapy

## 2023-11-08 DIAGNOSIS — G20A1 Parkinson's disease without dyskinesia, without mention of fluctuations: Secondary | ICD-10-CM

## 2023-11-08 DIAGNOSIS — R2689 Other abnormalities of gait and mobility: Secondary | ICD-10-CM

## 2023-11-08 NOTE — Therapy (Signed)
 OUTPATIENT PHYSICAL THERAPY LOWER EXTREMITY TREATMENT   Patient Name: Morgan [name] MRN: [id number] DOB:1950-10-20, 73 y.o., female Today's Date: 11/08/2023      PT End of Session - 11/08/23 1107     Visit Number 40    Number of Visits 72    Date for PT Re-Evaluation 12/13/23    Progress Note Due on Visit 44    PT Start Time 1105    PT Stop Time 1145    PT Time Calculation (min) 40 min    Equipment Utilized During Treatment Gait belt    Activity Tolerance Patient tolerated treatment well    Behavior During Therapy WFL for tasks assessed/performed                    Past Medical History:  Diagnosis Date   Anxiety    Fibromyalgia    Headache    Hypercholesteremia    controlled on medication   Hyperlipidemia    Hypertension    no current meds   LBBB (left bundle branch block)    Melanoma (HCC)    MVP (mitral valve prolapse)    Osteopenia    Parkinson disease (HCC)    Pneumonia    SVD (spontaneous vaginal delivery)    x 2   Past Surgical History:  Procedure Laterality Date   BLEPHAROPLASTY     cold knife conization     COLONOSCOPY     FH colon CA   CYSTOSCOPY W/ URETERAL STENT PLACEMENT Left 10/18/2021   Procedure: CYSTOSCOPY WITH RETROGRADE PYELOGRAM, URETERAL STENT PLACEMENT;  Surgeon: Jerilee Field, MD;  Location: [facility];  Service: Urology;  Laterality: Left;   DILATATION & CURETTAGE/HYSTEROSCOPY WITH MYOSURE N/A 02/14/2018   Procedure: ATTEMPTED DILATATION & CURETTAGE/HYSTEROSCOPY WITH MYOSURE;  Surgeon: Gerald Leitz, MD;  Location: [facility];  Service: Gynecology;  Laterality: N/A;  polyp   melanoma cancer     removed from left leg   RECONSTRUCTION OF EYELID     TONSILLECTOMY     TUBAL LIGATION     URETEROSCOPY WITH HOLMIUM LASER LITHOTRIPSY Left 12/17/2021   Procedure: URETEROSCOPY/HOLMIUM LASER/STENT EXCHANGE;  Surgeon: Jerilee Field, MD;  Location: [facility];  Service: Urology;  Laterality: Left;   WISDOM TOOTH EXTRACTION     Patient  Active Problem List   Diagnosis Date Noted   COVID-19 virus infection 05/16/2022   HTN (hypertension) 10/20/2021   Physical deconditioning 10/20/2021   Hydronephrosis with infection 10/18/2021   Bacteremia due to Escherichia coli 10/18/2021   Severe sepsis (HCC) 10/17/2021   UTI (urinary tract infection) 10/17/2021   Acute metabolic encephalopathy 10/17/2021   Parkinson disease (HCC) 10/17/2021   Cervical stenosis (uterine cervix) 02/14/2018   Postmenopausal bleeding 02/14/2018   LBBB (left bundle branch block) 12/25/2015   PVCs (premature ventricular contractions) 12/25/2015   Hyperlipidemia 12/25/2015   Fibromyalgia 01/16/2014   Cholelithiasis 04/25/2013   Paralysis agitans (HCC) 01/15/2013   Hereditary and idiopathic peripheral neuropathy 01/15/2013     [name] MD PCP:   Cindi Carbon PROVIDER: Antoine Primas MD   REFERRING DIAG: Parkinsons   THERAPY DIAG:  Other abnormalities of gait and mobility  Parkinson's disease without dyskinesia or fluctuating manifestations (HCC)  Rationale for Evaluation and Treatment Rehabilitation  ONSET DATE: Several years   SUBJECTIVE:   SUBJECTIVE STATEMENT: Patient states they changed her meds around. Double stepping a lot this morning. Not feeling great today.   PERTINENT HISTORY: Fibromyalgia; headaches, LBBB; osteopenia   PAIN:  Are you having pain? no:  NPRS scale: 0/10 Location: Pain description:    Aggravating factors: just comes and goes  Relieving factors: just comes and goes; sometimes ibuprofen works but she is limited as to how much she can take  PRECAUTIONS: Patient reports that because of her heart she will pass out at times.   WEIGHT BEARING RESTRICTIONS No  FALLS:  Has patient fallen in last 6 months? Yes.  6 falls but likley more. Last fall    LIVING ENVIRONMENT: Stairs to the second floor in her house but she dosen't do much  4 steps in the front  2 steps in the garage  OCCUPATION: retired    Presenter, broadcasting: reading; goes to an exercise class   PLOF: Independent with household mobility with device  PATIENT GOALS  To have less pain in her legs and to move better    OBJECTIVE:  (measurements taken at evaluation unless otherwise noted)   DIAGNOSTIC FINDINGS:   PATIENT SURVEYS:  FOTO    COGNITION:  Overall cognitive status: Within functional limits for tasks assessed     SENSATION: Denies paresthesias   POSTURE: No Significant postural limitations  PALPATION: No trigger points noted   LOWER EXTREMITY ROM:  Passive ROM Right eval Left eval  Hip flexion    Hip extension    Hip abduction    Hip adduction    Hip internal rotation    Hip external rotation    Knee flexion    Knee extension    Ankle dorsiflexion    Ankle plantarflexion    Ankle inversion    Ankle eversion     (Blank rows = not tested)  WNL  LOWER EXTREMITY MMT:  MMT Right eval Left eval Right  7/22 Left  7/2 Right  9/12 Left 9/12 Right 06/13/23 Left 06/13/23 Right 08/02/23 Left 08/02/23 Right 10/18/23 Left 10/18/23  Hip flexion 21.7 29.7 23.5 25.9 24.6 25.6 41.3 37.8 47 45.5 44.9 46  Hip extension              Hip abduction 27.6 18.5 19.1 8.7 21.2 14.4 29.9 29.5 35.5 35.3 25.7 35.5  Hip adduction              Hip internal rotation              Hip external rotation              Knee flexion              Knee extension 23.5 20.2 17.7 16.8 22.7 16.3 39.4 49.8 48.8 53.5 48 42.3  Ankle dorsiflexion              Ankle plantarflexion              Ankle inversion              Ankle eversion               (Blank rows = not tested)  GAIT: Decreased hip flexion; leans to the left; right hip frop. Patient required CGA while walking and min a when she turned for balance. No change from IE   Functional tests:  Sit to stand: cuing to catch balance. Uses momentum to stand.    5x sit to stand Balance: 13 sec   9/12 5x sit to stand 13 sec Narrow base min a  Narrow base eyes closed  min to mod a  Tandem stance Min a both ways.  Single leg stance 5 sec bilateral  06/13/23: 285 feet with walking stick, CGA  for safety  Reassessment 08/02/23:  258 feet with walking stick, CGA  for safety 5xSTS: 13.5 seconds with poor eccentric control  Reassessment 10/18/23:  - deferred today due to PT injury 5xSTS: 13.36 seconds with poor eccentric control, posterior lean with transfer/standing  BERG Balance Test          Date:      Sit to Stand 1 2 06/13/23: 2 08/02/23: 3 10/18/23: 3  Standing unsupported 3 3 3 3 3   Sitting with back unsupported but feet supported 1 3 4 4 4   Stand to sit  1 2 3 3 3   Transfers  2 1 2 2 3   Standing unsupported with eyes closed 3 3 3 3 3   Standing unsupported feet together 3 3 3 3 3   From standing position, reach forward with outstretched arm 1 2 3 3 3   From standing position, pick up object from floor 1 2 3 3 3   From standing position, turn and look behind over each shoulder 1 2 2 3 3   Turn 360 2 1 2 2 2   Standing unsupported, alternately place foot on step 1 1 2 2 2   Standing unsupported, one foot in front 1 1 2 2 2   Standing on one leg 0 0 2 1 2   Total:  21/56 26/56 34/56  33/56 34/56      TODAY'S TREATMENT: 11/08/23 Seated shoulder press 3# 2 x 10  Seated LAQ 2 x 10 with 5 second holds Seated hip adduction isometric 10 x 10 second holds Seated hip abduction isometric 10 x 10 second holds Seated bicep curls 3# bar 2 x 20   11/04/23 Nustep 5 minutes for dynamic warm up and conditioning level 5, seat 7 Hamstring curl machine 30# 2 x 10  Leg press machine 10# 2 x 10  Knee extension 10# 2 x 10 Hip adduction 40# 3 x 15 Hip abduction 40# 3 x 15  11/01/23  Nustep 5 minutes for dynamic warm up and conditioning level 5, seat 7   10/28/23  Nustep 5 minutes for dynamic warm up and conditioning level 5, seat 7 Hamstring curl machine 30# 2 x 10  Leg press machine 10# 2 x 10  Knee extension 10# 2 x 10 Calf machine 10# 2 x 10 Hip  adduction 40# 2 x 15 Hip abduction 40# 2 x 15 Row GTB 3x15 High row GTB 2 x 15  10/25/23 STS with overhead reach 3 x 10 Nustep 5 minutes for dynamic warm up and conditioning level 5, seat 7 Lateral step up 4 inch 2 x 20 Seated tricep dips 3 x 10   10/21/23 Nustep 6 minutes for dynamic warm up and conditioning level 5, seat 7 STS with overhead reach 3 x 10 Standing step taps 4 inch step 2 x 20 bilateral Seated shoulder press 4# 3 x 10  Seated tricep dips 3 x 10  LAQ 3# 1 x 10 with 5 second holds Anterior stepping with overhead reach 2 x 15  10/18/23 Reassessment Seated punches 4# 3 x 10 Seated shoulder press 4# 3 x 10  Seated bicep curls 4# 3 x 10  09/06/23 Nustep 6 minutes for dynamic warm up and conditioning level 5, seat 7 Active hamstring stretch 10 x 10 second holds Supine hamstring isometrics 10 x 5 second holds LAQ 3# 1 x 10 second holds Seated shoulder press 4# 3 x 10 Seated tricep dips 3 x 10  12/26 Nustep 6 minutes for dynamic warm up and conditioning level 5, seat 7 Punches 3 lbs 3x10  Seated punches 4# 3 x 10 Seated shoulder press 4# 3 x 10  Seated bicep curls 4# 3 x 10  Air-ex 2x30 seconds EO. Had to go to the bathroom after second set.   Gait: walked into the gym with CGA 30'x2  Walked to the bathroom 75'x2      12/23  Nustep 6 minutes for dynamic warm up and conditioning level 5, seat 7 Punches 3 lbs 3x10  Seated punches 4# 3 x 10 Seated shoulder press 4# 3 x 10  Seated bicep curls 4# 3 x 10  LAQ 3x10 red  March 3x10 red   Narrow base of support air-ex EO and EC 2x30  Heel toe on air-ex x30  Stnaidng march 2x10 on airpex    08/18/23 Nustep 6 minutes for dynamic warm up and conditioning level 5, seat 7 Seated punches 4# 3 x 10 Seated shoulder press 4# 3 x 10  Seated bicep curls 4# 3 x 10 Seated tricep dips 3 x 10  Anterior step with overhead reach 2 x 10 bilateral Lateral step with opening reach 2 x 10 bilateral NBOS on foam with  anterior and lateral reaches 2 x 10   08/16/23 Nustep 6 minutes for dynamic warm up and conditioning level 5, seat 7 Seated punches 3# 3 x 10 Seated shoulder press 3# 3 x 10  Seated bicep curls 3# 3 x 10 Seated tricep dips 3 x 10  STS with overhead reach 3 x 10 Standing step taps 4 inch step 2 x 20 bilateral   08/04/23 Nustep 6 minutes for dynamic warm up and conditioning level 5, seat 7 Seated punches 3# 3 x 10 Seated shoulder press 3# 3 x 10  Seated small tricep dips 2 x 10 Seated bicep curls 3# 3 x 10 Standing lunges with UE support 2 x 10 bilateral Shoulder abduction 1# 2 x 10   PATIENT EDUCATION:  Education details: exercise rationale and technique   Person educated: Patient Education method: Explanation, Demonstration, Tactile cues, Verbal cues, Education comprehension: verbalized understanding, returned demonstration, verbal cues required, tactile cues required, and needs further education   HOME EXERCISE PROGRAM: Access Code: [email or web address]   URL: [email or web address] Date: 06/28/2023 - Seated March with Resistance  - 1 x daily - 7 x weekly - 3 sets - 15 reps - Seated Hip Abduction with Resistance  - 1 x daily - 7 x weekly - 3 sets - 15 reps - Seated Long Arc Quad  - 1 x daily - 7 x weekly - 3 sets - 15 reps - Shoulder External Rotation and Scapular Retraction with Resistance  - 1 x daily - 7 x weekly - 2 sets - 10 reps - Seated Shoulder Horizontal Abduction with Resistance  - 1 x daily - 7 x weekly - 2 sets - 10 reps  08/11/23 - Seated Punches  - 1 x daily - 7 x weekly - 3 sets - 10 reps - Seated Shoulder Overhead Press with Dumbbells with PLB  - 1 x daily - 7 x weekly - 3 sets - 10 reps - Seated Bicep Curls Supinated with Dumbbells  - 1 x daily - 7 x weekly - 3 sets - 10 reps - Seated Shoulder Abduction with Dumbbells - Thumbs Up  - 1 x daily - 7 x weekly - 3 sets - 10 reps  ASSESSMENT:  CLINICAL IMPRESSION: Patient with  more impaired mobility  today likely due to medication changes that began last night. Performed seated exercises today with decreased weights/reps compared to prior session due to decreased activity tolerance and mobility deficit. Patient will continue to benefit from physical therapy in order to improve function and reduce impairment.    OBJECTIVE IMPAIRMENTS Abnormal gait, decreased activity tolerance, decreased balance, decreased mobility, difficulty walking, decreased strength, impaired tone, and pain.   ACTIVITY LIMITATIONS carrying, lifting, bending, standing, squatting, stairs, transfers, bed mobility, dressing, and locomotion level  PARTICIPATION LIMITATIONS: meal prep, cleaning, laundry, driving, shopping, community activity, and yard work  PERSONAL FACTORS Fibromyalgia; headaches, LBBB; osteopenia  are also affecting patient's functional outcome.   REHAB POTENTIAL: Good  CLINICAL DECISION MAKING: Evolving/moderate complexity increasing pain in her quads   EVALUATION COMPLEXITY: Moderate   GOALS: Goals reviewed with patient? Yes  SHORT TERM GOALS: Target date: 04/18/2023  Patient will transfer sit to stand smoothly and independently  Baseline: Goal status: depends on the day 9/12  2.  Patient will increase gross strength by 5lbs  Baseline:  Goal status: MET  3.  Patient will improve BERG score by 10  Baseline:  Goal status: MET LONG TERM GOALS: Target date:POC  Patient will go improve balance to CGA for all balance exercises to improve safety  Baseline:  Goal status: INITIAL  2.  Patient will report a 50% reduction in falls  Baseline:  Goal status: INITIAL  3.  Patient will have a full balance program  Baseline:  Goal status: INITIAL 4. Patient will score at least 38/56 on Berg balance scale in order to demonstrate improved balance to reduce the risk for falls at home and in the community.  Baseline:  Goal status: INITIAL   PLAN: PT FREQUENCY: 2x/week  PT DURATION: 8  weeks  PLANNED INTERVENTIONS: Therapeutic exercises, Therapeutic activity, Neuromuscular re-education, Balance training, Gait training, Patient/Family education, Self Care, Joint mobilization, Stair training, Aquatic Therapy, Cryotherapy, Moist heat, Taping, Manual therapy, and Re-evaluation  PLAN FOR NEXT SESSION: work on improving sit to stand, posterior, dynamic, and static balance. Work on general conditioning. Be aware of leg pain on any given day.    Wyman Songster, PT DPT 11/08/2023, 11:08 AM

## 2023-11-09 DIAGNOSIS — Z1231 Encounter for screening mammogram for malignant neoplasm of breast: Secondary | ICD-10-CM | POA: Diagnosis not present

## 2023-11-11 ENCOUNTER — Ambulatory Visit (HOSPITAL_BASED_OUTPATIENT_CLINIC_OR_DEPARTMENT_OTHER): Payer: Medicare Other | Admitting: Physical Therapy

## 2023-11-11 ENCOUNTER — Encounter (HOSPITAL_BASED_OUTPATIENT_CLINIC_OR_DEPARTMENT_OTHER): Payer: Self-pay | Admitting: Physical Therapy

## 2023-11-11 DIAGNOSIS — G20A1 Parkinson's disease without dyskinesia, without mention of fluctuations: Secondary | ICD-10-CM | POA: Diagnosis not present

## 2023-11-11 DIAGNOSIS — R2689 Other abnormalities of gait and mobility: Secondary | ICD-10-CM | POA: Diagnosis not present

## 2023-11-11 NOTE — Therapy (Signed)
 OUTPATIENT PHYSICAL THERAPY LOWER EXTREMITY TREATMENT   Patient Name: Morgan [name] MRN: [id number] DOB:Nov 29, 1950, 73 y.o., female Today's Date: 11/11/2023      PT End of Session - 11/11/23 1047     Visit Number 41    Number of Visits 72    Date for PT Re-Evaluation 12/13/23    Progress Note Due on Visit 44    PT Start Time 1046    PT Stop Time 1125    PT Time Calculation (min) 39 min    Equipment Utilized During Treatment Gait belt    Activity Tolerance Patient tolerated treatment well    Behavior During Therapy WFL for tasks assessed/performed                    Past Medical History:  Diagnosis Date   Anxiety    Fibromyalgia    Headache    Hypercholesteremia    controlled on medication   Hyperlipidemia    Hypertension    no current meds   LBBB (left bundle branch block)    Melanoma (HCC)    MVP (mitral valve prolapse)    Osteopenia    Parkinson disease (HCC)    Pneumonia    SVD (spontaneous vaginal delivery)    x 2   Past Surgical History:  Procedure Laterality Date   BLEPHAROPLASTY     cold knife conization     COLONOSCOPY     FH colon CA   CYSTOSCOPY W/ URETERAL STENT PLACEMENT Left 10/18/2021   Procedure: CYSTOSCOPY WITH RETROGRADE PYELOGRAM, URETERAL STENT PLACEMENT;  Surgeon: Jerilee Field, MD;  Location: [facility];  Service: Urology;  Laterality: Left;   DILATATION & CURETTAGE/HYSTEROSCOPY WITH MYOSURE N/A 02/14/2018   Procedure: ATTEMPTED DILATATION & CURETTAGE/HYSTEROSCOPY WITH MYOSURE;  Surgeon: Gerald Leitz, MD;  Location: [facility];  Service: Gynecology;  Laterality: N/A;  polyp   melanoma cancer     removed from left leg   RECONSTRUCTION OF EYELID     TONSILLECTOMY     TUBAL LIGATION     URETEROSCOPY WITH HOLMIUM LASER LITHOTRIPSY Left 12/17/2021   Procedure: URETEROSCOPY/HOLMIUM LASER/STENT EXCHANGE;  Surgeon: Jerilee Field, MD;  Location: [facility];  Service: Urology;  Laterality: Left;   WISDOM TOOTH EXTRACTION     Patient  Active Problem List   Diagnosis Date Noted   COVID-19 virus infection 05/16/2022   HTN (hypertension) 10/20/2021   Physical deconditioning 10/20/2021   Hydronephrosis with infection 10/18/2021   Bacteremia due to Escherichia coli 10/18/2021   Severe sepsis (HCC) 10/17/2021   UTI (urinary tract infection) 10/17/2021   Acute metabolic encephalopathy 10/17/2021   Parkinson disease (HCC) 10/17/2021   Cervical stenosis (uterine cervix) 02/14/2018   Postmenopausal bleeding 02/14/2018   LBBB (left bundle branch block) 12/25/2015   PVCs (premature ventricular contractions) 12/25/2015   Hyperlipidemia 12/25/2015   Fibromyalgia 01/16/2014   Cholelithiasis 04/25/2013   Paralysis agitans (HCC) 01/15/2013   Hereditary and idiopathic peripheral neuropathy 01/15/2013     [name] MD PCP:   Cindi Carbon PROVIDER: Antoine Primas MD   REFERRING DIAG: Parkinsons   THERAPY DIAG:  Other abnormalities of gait and mobility  Parkinson's disease without dyskinesia or fluctuating manifestations (HCC)  Rationale for Evaluation and Treatment Rehabilitation  ONSET DATE: Several years   SUBJECTIVE:   SUBJECTIVE STATEMENT: Patient states feeling better today. Changed meds back to old regimen and is doing better. One fall the other day when walking.    PERTINENT HISTORY: Fibromyalgia; headaches, LBBB; osteopenia   PAIN:  Are you having pain? no: NPRS scale: 0/10 Location: Pain description:    Aggravating factors: just comes and goes  Relieving factors: just comes and goes; sometimes ibuprofen works but she is limited as to how much she can take  PRECAUTIONS: Patient reports that because of her heart she will pass out at times.   WEIGHT BEARING RESTRICTIONS No  FALLS:  Has patient fallen in last 6 months? Yes.  6 falls but likley more. Last fall    LIVING ENVIRONMENT: Stairs to the second floor in her house but she dosen't do much  4 steps in the front  2 steps in the garage   OCCUPATION: retired   Presenter, broadcasting: reading; goes to an exercise class   PLOF: Independent with household mobility with device  PATIENT GOALS  To have less pain in her legs and to move better    OBJECTIVE:  (measurements taken at evaluation unless otherwise noted)   DIAGNOSTIC FINDINGS:   PATIENT SURVEYS:  FOTO    COGNITION:  Overall cognitive status: Within functional limits for tasks assessed     SENSATION: Denies paresthesias   POSTURE: No Significant postural limitations  PALPATION: No trigger points noted   LOWER EXTREMITY ROM:  Passive ROM Right eval Left eval  Hip flexion    Hip extension    Hip abduction    Hip adduction    Hip internal rotation    Hip external rotation    Knee flexion    Knee extension    Ankle dorsiflexion    Ankle plantarflexion    Ankle inversion    Ankle eversion     (Blank rows = not tested)  WNL  LOWER EXTREMITY MMT:  MMT Right eval Left eval Right  7/22 Left  7/2 Right  9/12 Left 9/12 Right 06/13/23 Left 06/13/23 Right 08/02/23 Left 08/02/23 Right 10/18/23 Left 10/18/23  Hip flexion 21.7 29.7 23.5 25.9 24.6 25.6 41.3 37.8 47 45.5 44.9 46  Hip extension              Hip abduction 27.6 18.5 19.1 8.7 21.2 14.4 29.9 29.5 35.5 35.3 25.7 35.5  Hip adduction              Hip internal rotation              Hip external rotation              Knee flexion              Knee extension 23.5 20.2 17.7 16.8 22.7 16.3 39.4 49.8 48.8 53.5 48 42.3  Ankle dorsiflexion              Ankle plantarflexion              Ankle inversion              Ankle eversion               (Blank rows = not tested)  GAIT: Decreased hip flexion; leans to the left; right hip frop. Patient required CGA while walking and min a when she turned for balance. No change from IE   Functional tests:  Sit to stand: cuing to catch balance. Uses momentum to stand.    5x sit to stand Balance: 13 sec   9/12 5x sit to stand 13 sec Narrow base min a   Narrow base eyes closed min to mod a  Tandem stance Min a both ways.  Single leg stance 5 sec  bilateral     06/13/23: 285 feet with walking stick, CGA  for safety  Reassessment 08/02/23:  258 feet with walking stick, CGA  for safety 5xSTS: 13.5 seconds with poor eccentric control  Reassessment 10/18/23:  - deferred today due to PT injury 5xSTS: 13.36 seconds with poor eccentric control, posterior lean with transfer/standing  BERG Balance Test          Date:      Sit to Stand 1 2 06/13/23: 2 08/02/23: 3 10/18/23: 3  Standing unsupported 3 3 3 3 3   Sitting with back unsupported but feet supported 1 3 4 4 4   Stand to sit  1 2 3 3 3   Transfers  2 1 2 2 3   Standing unsupported with eyes closed 3 3 3 3 3   Standing unsupported feet together 3 3 3 3 3   From standing position, reach forward with outstretched arm 1 2 3 3 3   From standing position, pick up object from floor 1 2 3 3 3   From standing position, turn and look behind over each shoulder 1 2 2 3 3   Turn 360 2 1 2 2 2   Standing unsupported, alternately place foot on step 1 1 2 2 2   Standing unsupported, one foot in front 1 1 2 2 2   Standing on one leg 0 0 2 1 2   Total:  21/56 26/56 34/56  33/56 34/56      TODAY'S TREATMENT: 11/11/23 Nustep 5 minutes for dynamic warm up and conditioning level 5, seat 7 Seated shoulder press 4# 3 x 10  Seated punches 4# 3 x 10 Gait with RW 1 x 40 feet, 1 x 100 feet; gait with walking stick 1 x 100 Seated ab iso with ball 10 x 5 second holds Anterior stepping with overhead reach 2 x 12 Lateral stepping with overhead reach 2 x 10  11/08/23 Seated shoulder press 3# 2 x 10  Seated LAQ 2 x 10 with 5 second holds Seated hip adduction isometric 10 x 10 second holds Seated hip abduction isometric 10 x 10 second holds Seated bicep curls 3# bar 2 x 20   11/04/23 Nustep 5 minutes for dynamic warm up and conditioning level 5, seat 7 Hamstring curl machine 30# 2 x 10  Leg press machine 10# 2 x  10  Knee extension 10# 2 x 10 Hip adduction 40# 3 x 15 Hip abduction 40# 3 x 15  11/01/23  Nustep 5 minutes for dynamic warm up and conditioning level 5, seat 7   10/28/23  Nustep 5 minutes for dynamic warm up and conditioning level 5, seat 7 Hamstring curl machine 30# 2 x 10  Leg press machine 10# 2 x 10  Knee extension 10# 2 x 10 Calf machine 10# 2 x 10 Hip adduction 40# 2 x 15 Hip abduction 40# 2 x 15 Row GTB 3x15 High row GTB 2 x 15  10/25/23 STS with overhead reach 3 x 10 Nustep 5 minutes for dynamic warm up and conditioning level 5, seat 7 Lateral step up 4 inch 2 x 20 Seated tricep dips 3 x 10   10/21/23 Nustep 6 minutes for dynamic warm up and conditioning level 5, seat 7 STS with overhead reach 3 x 10 Standing step taps 4 inch step 2 x 20 bilateral Seated shoulder press 4# 3 x 10  Seated tricep dips 3 x 10  LAQ 3# 1 x 10 with 5 second holds Anterior stepping with overhead reach 2 x  15  10/18/23 Reassessment Seated punches 4# 3 x 10 Seated shoulder press 4# 3 x 10  Seated bicep curls 4# 3 x 10  09/06/23 Nustep 6 minutes for dynamic warm up and conditioning level 5, seat 7 Active hamstring stretch 10 x 10 second holds Supine hamstring isometrics 10 x 5 second holds LAQ 3# 1 x 10 second holds Seated shoulder press 4# 3 x 10 Seated tricep dips 3 x 10    12/26 Nustep 6 minutes for dynamic warm up and conditioning level 5, seat 7 Punches 3 lbs 3x10  Seated punches 4# 3 x 10 Seated shoulder press 4# 3 x 10  Seated bicep curls 4# 3 x 10  Air-ex 2x30 seconds EO. Had to go to the bathroom after second set.   Gait: walked into the gym with CGA 30'x2  Walked to the bathroom 75'x2      12/23  Nustep 6 minutes for dynamic warm up and conditioning level 5, seat 7 Punches 3 lbs 3x10  Seated punches 4# 3 x 10 Seated shoulder press 4# 3 x 10  Seated bicep curls 4# 3 x 10  LAQ 3x10 red  March 3x10 red   Narrow base of support air-ex EO and EC 2x30  Heel toe  on air-ex x30  Stnaidng march 2x10 on airpex    08/18/23 Nustep 6 minutes for dynamic warm up and conditioning level 5, seat 7 Seated punches 4# 3 x 10 Seated shoulder press 4# 3 x 10  Seated bicep curls 4# 3 x 10 Seated tricep dips 3 x 10  Anterior step with overhead reach 2 x 10 bilateral Lateral step with opening reach 2 x 10 bilateral NBOS on foam with anterior and lateral reaches 2 x 10   08/16/23 Nustep 6 minutes for dynamic warm up and conditioning level 5, seat 7 Seated punches 3# 3 x 10 Seated shoulder press 3# 3 x 10  Seated bicep curls 3# 3 x 10 Seated tricep dips 3 x 10  STS with overhead reach 3 x 10 Standing step taps 4 inch step 2 x 20 bilateral   08/04/23 Nustep 6 minutes for dynamic warm up and conditioning level 5, seat 7 Seated punches 3# 3 x 10 Seated shoulder press 3# 3 x 10  Seated small tricep dips 2 x 10 Seated bicep curls 3# 3 x 10 Standing lunges with UE support 2 x 10 bilateral Shoulder abduction 1# 2 x 10   PATIENT EDUCATION:  Education details: exercise rationale and technique   Person educated: Patient Education method: Explanation, Demonstration, Tactile cues, Verbal cues, Education comprehension: verbalized understanding, returned demonstration, verbal cues required, tactile cues required, and needs further education   HOME EXERCISE PROGRAM: Access Code: [email or web address]   URL: [email or web address] Date: 06/28/2023 - Seated March with Resistance  - 1 x daily - 7 x weekly - 3 sets - 15 reps - Seated Hip Abduction with Resistance  - 1 x daily - 7 x weekly - 3 sets - 15 reps - Seated Long Arc Quad  - 1 x daily - 7 x weekly - 3 sets - 15 reps - Shoulder External Rotation and Scapular Retraction with Resistance  - 1 x daily - 7 x weekly - 2 sets - 10 reps - Seated Shoulder Horizontal Abduction with Resistance  - 1 x daily - 7 x weekly - 2 sets - 10 reps  08/11/23 - Seated Punches  - 1 x daily - 7 x weekly - 3 sets -  10 reps -  Seated Shoulder Overhead Press with Dumbbells with PLB  - 1 x daily - 7 x weekly - 3 sets - 10 reps - Seated Bicep Curls Supinated with Dumbbells  - 1 x daily - 7 x weekly - 3 sets - 10 reps - Seated Shoulder Abduction with Dumbbells - Thumbs Up  - 1 x daily - 7 x weekly - 3 sets - 10 reps  ASSESSMENT:  CLINICAL IMPRESSION: Patient with improved mobility today compared to last few session. Continued with strengthening exercises which are tolerated well. Trial of RW  with continued shuffled gait but balance overall appears to be better as patient tends to rush fore with walking stick with more frequent shuffling at time of performance today. Fatigue 5/10 at end of session. Patient will continue to benefit from physical therapy in order to improve function and reduce impairment.    OBJECTIVE IMPAIRMENTS Abnormal gait, decreased activity tolerance, decreased balance, decreased mobility, difficulty walking, decreased strength, impaired tone, and pain.   ACTIVITY LIMITATIONS carrying, lifting, bending, standing, squatting, stairs, transfers, bed mobility, dressing, and locomotion level  PARTICIPATION LIMITATIONS: meal prep, cleaning, laundry, driving, shopping, community activity, and yard work  PERSONAL FACTORS Fibromyalgia; headaches, LBBB; osteopenia  are also affecting patient's functional outcome.   REHAB POTENTIAL: Good  CLINICAL DECISION MAKING: Evolving/moderate complexity increasing pain in her quads   EVALUATION COMPLEXITY: Moderate   GOALS: Goals reviewed with patient? Yes  SHORT TERM GOALS: Target date: 04/18/2023  Patient will transfer sit to stand smoothly and independently  Baseline: Goal status: depends on the day 9/12  2.  Patient will increase gross strength by 5lbs  Baseline:  Goal status: MET  3.  Patient will improve BERG score by 10  Baseline:  Goal status: MET LONG TERM GOALS: Target date:POC  Patient will go improve balance to CGA for all balance exercises  to improve safety  Baseline:  Goal status: INITIAL  2.  Patient will report a 50% reduction in falls  Baseline:  Goal status: INITIAL  3.  Patient will have a full balance program  Baseline:  Goal status: INITIAL 4. Patient will score at least 38/56 on Berg balance scale in order to demonstrate improved balance to reduce the risk for falls at home and in the community.  Baseline:  Goal status: INITIAL   PLAN: PT FREQUENCY: 2x/week  PT DURATION: 8 weeks  PLANNED INTERVENTIONS: Therapeutic exercises, Therapeutic activity, Neuromuscular re-education, Balance training, Gait training, Patient/Family education, Self Care, Joint mobilization, Stair training, Aquatic Therapy, Cryotherapy, Moist heat, Taping, Manual therapy, and Re-evaluation  PLAN FOR NEXT SESSION: work on improving sit to stand, posterior, dynamic, and static balance. Work on general conditioning. Be aware of leg pain on any given day.    Wyman Songster, PT DPT 11/11/2023, 11:19 AM

## 2023-11-12 ENCOUNTER — Emergency Department (HOSPITAL_COMMUNITY)

## 2023-11-12 ENCOUNTER — Emergency Department (HOSPITAL_COMMUNITY)
Admission: EM | Admit: 2023-11-12 | Discharge: 2023-11-12 | Disposition: A | Discharge status: Home / Self Care | Attending: Emergency Medicine | Admitting: Emergency Medicine

## 2023-11-12 DIAGNOSIS — Z79899 Other long term (current) drug therapy: Secondary | ICD-10-CM | POA: Insufficient documentation

## 2023-11-12 DIAGNOSIS — E876 Hypokalemia: Secondary | ICD-10-CM | POA: Diagnosis not present

## 2023-11-12 DIAGNOSIS — Z7982 Long term (current) use of aspirin: Secondary | ICD-10-CM | POA: Diagnosis not present

## 2023-11-12 DIAGNOSIS — N189 Chronic kidney disease, unspecified: Secondary | ICD-10-CM | POA: Diagnosis not present

## 2023-11-12 DIAGNOSIS — G20C Parkinsonism, unspecified: Secondary | ICD-10-CM | POA: Diagnosis not present

## 2023-11-12 DIAGNOSIS — N390 Urinary tract infection, site not specified: Secondary | ICD-10-CM

## 2023-11-12 DIAGNOSIS — I129 Hypertensive chronic kidney disease with stage 1 through stage 4 chronic kidney disease, or unspecified chronic kidney disease: Secondary | ICD-10-CM | POA: Diagnosis not present

## 2023-11-12 DIAGNOSIS — I700 Atherosclerosis of aorta: Secondary | ICD-10-CM | POA: Diagnosis not present

## 2023-11-12 LAB — BASIC METABOLIC PANEL
Anion gap: 9 (ref 5–15)
BUN: 16 mg/dL (ref 8–23)
CO2: 27 mmol/L (ref 22–32)
Calcium: 9.4 mg/dL (ref 8.9–10.3)
Chloride: 101 mmol/L (ref 98–111)
Creatinine, Ser: 0.88 mg/dL (ref 0.44–1.00)
GFR, Estimated: >60 mL/min (ref >60)
Glucose, Bld: 102 mg/dL — ABNORMAL HIGH (ref 70–99)
Potassium: 3.3 mmol/L — ABNORMAL LOW (ref 3.5–5.1)
Sodium: 137 mmol/L (ref 135–145)

## 2023-11-12 LAB — URINALYSIS, W/ REFLEX TO CULTURE (INFECTION SUSPECTED)
Bilirubin Urine: NEGATIVE
Glucose, UA: NEGATIVE mg/dL
Hgb urine dipstick: NEGATIVE
Ketones, ur: NEGATIVE mg/dL
Nitrite: POSITIVE — AB
Protein, ur: NEGATIVE mg/dL
Specific Gravity, Urine: 1.008 (ref 1.005–1.030)
pH: 7 (ref 5.0–8.0)

## 2023-11-12 LAB — CBC WITH DIFFERENTIAL/PLATELET
Abs Immature Granulocytes: 0.03 10*3/uL (ref 0.00–0.07)
Basophils Absolute: 0 10*3/uL (ref 0.0–0.1)
Basophils Relative: 0 %
Eosinophils Absolute: 0.1 10*3/uL (ref 0.0–0.5)
Eosinophils Relative: 2 %
HCT: 42.1 % (ref 36.0–46.0)
Hemoglobin: 13.6 g/dL (ref 12.0–15.0)
Immature Granulocytes: 0 %
Lymphocytes Relative: 28 %
Lymphs Abs: 2.2 10*3/uL (ref 0.7–4.0)
MCH: 29.4 pg (ref 26.0–34.0)
MCHC: 32.3 g/dL (ref 30.0–36.0)
MCV: 90.9 fL (ref 80.0–100.0)
Monocytes Absolute: 0.4 10*3/uL (ref 0.1–1.0)
Monocytes Relative: 5 %
Neutro Abs: 5 10*3/uL (ref 1.7–7.7)
Neutrophils Relative %: 65 %
Platelets: 262 10*3/uL (ref 150–400)
RBC: 4.63 MIL/uL (ref 3.87–5.11)
RDW: 13.6 % (ref 11.5–15.5)
WBC: 7.8 10*3/uL (ref 4.0–10.5)
nRBC: 0 % (ref 0.0–0.2)

## 2023-11-12 MED ORDER — CEPHALEXIN 500 MG PO CAPS: 500.0000 mg | ORAL_CAPSULE | Freq: Two times a day (BID) | ORAL | 0 refills | Status: AC

## 2023-11-12 NOTE — ED Provider Notes (Signed)
 [place] [facility]ERGENCY DEPARTMENT AT [facility] Provider Note   CSN: [id number] Arrival date & time: 11/12/23  1404     History  Chief Complaint  Patient presents with   Fall   Urinary Tract Infection    Morgan [name] is a 73 y.o. female.   Fall  Urinary Tract Infection  Patient is a 73 year old female presents the ED today complaining of 2-day history of instability and malodorous urine, saying that this all started after her neurologist changed her Parkinson's medication.  Feels more unstable when taking the medication.  Recently hospitalized on 09/23/2023 in [place] for pneumonia, acute pulmonary edema, sepsis, CKD, and UTI and was treated with IV antibiotics.  States that she had fallen today but did not hit head, no LOC, no upper extremity pain, no lower extremity pain, no back pain, no neck pain.   Denies fever, headache, vision changes, chest pain, shortness breath, abdominal pain, nausea, vomiting, diarrhea, dysuria, hematuria, hematochezia, melena, lower leg swelling.  Home Medications Prior to Admission medications   Medication Sig Start Date End Date Taking? Authorizing Provider  cephALEXin (KEFLEX) 500 MG capsule Take 1 capsule (500 mg total) by mouth 2 (two) times daily for 7 days. 11/12/23 11/19/23 Yes Lunette Stands, PA-C  ALPRAZolam Prudy Feeler) 0.25 MG tablet Take 0.25 mg by mouth daily as needed for anxiety.     [provider]  amLODipine (NORVASC) 2.5 MG tablet Take 1 tablet (2.5 mg total) by mouth daily. 01/31/23 03/02/23  Carroll Sage, PA-C  aspirin EC 81 MG tablet Take 81 mg by mouth daily. Swallow whole.    [provider]  atorvastatin (LIPITOR) 10 MG tablet Take 10 mg by mouth in the morning.    [provider]  BLINK TEARS 0.25 % SOLN Place 1 drop into both eyes 3 (three) times daily as needed (for dryness).    [provider]  carbidopa-levodopa (SINEMET IR) 25-100 MG tablet 1 tablet every 2 hours starting  at 6-7am (getting about 6-7 in per day)The medication have a score line so it can be broken. 11/22/22   [name], [name], DO  Cholecalciferol (VITAMIN D3) 25 MCG (1000 UT) CHEW Chew 2,500 Units by mouth daily.    [provider]  Coenzyme Q10-Vitamin E (QUNOL ULTRA COQ10 PO) Take 1 capsule by mouth daily.    [provider]  CYMBALTA 60 MG capsule TAKE 1 CAPSULE EVERY       MORNING 10/17/22   [name], [name], DO  gabapentin (NEURONTIN) 400 MG capsule Take 1 capsule (400 mg total) by mouth 3 (three) times daily as needed (for leg pain or soreness). 05/18/22   [name], [name], MD  Ibuprofen 200 MG CAPS Take 2 capsules (400 mg total) by mouth every 8 (eight) hours as needed (Leg pain, headache or mild pain). 05/18/22   [name], [name], MD  Levodopa (INBRIJA) 42 MG CAPS You can inhale the capsules as needed up to 5 times per day, separated by 2 hour intervals. 10/17/22   [name], [name], DO  methocarbamol (ROBAXIN) 750 MG tablet Take 750 mg by mouth every 6 (six) hours as needed for muscle spasms (or leg pain). 03/25/20   [provider]  midodrine (PROAMATINE) 2.5 MG tablet Take 1 tablet (2.5 mg total) by mouth 3 (three) times daily as needed (for systolic blood pressure less than 100). 06/10/22   [name], NP  Multiple Vitamins-Minerals (PRESERVISION AREDS 2 PO) Take 1 capsule by  mouth 2 (two) times daily.    [provider]  Pramipexole Dihydrochloride 0.75 MG TB24 TAKE 1 TABLET DAILY 11/21/22   [name], [name], DO  raloxifene (EVISTA) 60 MG tablet Take 60 mg by mouth in the morning.    [provider]  ramelteon (ROZEREM) 8 MG tablet TAKE 1 TABLET AT BEDTIME. 11/21/22   [name], [name], DO  TART CHERRY PO Take 3,000 mg by mouth daily.    [provider]      Allergies    Other, Phenergan [promethazine hcl], Acetaminophen, Itraconazole, and Tamsulosin    Review of Systems   Review of Systems  Genitourinary:        Incontinence and malodorous  urine  All other systems reviewed and are negative.   Physical Exam Updated Vital Signs BP (!) 159/102 (BP Location: Right Arm)   Pulse 97   Temp 98.3 F (36.8 C)   Resp 14   SpO2 96%  Physical Exam Vitals and nursing note reviewed.  Constitutional:      General: She is not in acute distress.    Appearance: Normal appearance. She is not ill-appearing.  HENT:     Head: Normocephalic and atraumatic.  Eyes:     General: No scleral icterus.       Right eye: No discharge.        Left eye: No discharge.     Extraocular Movements: Extraocular movements intact.     Conjunctiva/sclera: Conjunctivae normal.  Cardiovascular:     Rate and Rhythm: Normal rate and regular rhythm.     Pulses: Normal pulses.     Heart sounds: Normal heart sounds. No murmur heard.    No friction rub. No gallop.  Pulmonary:     Effort: Pulmonary effort is normal. No respiratory distress.     Breath sounds: Normal breath sounds. No stridor. No wheezing, rhonchi or rales.  Abdominal:     General: Abdomen is flat. There is no distension.     Palpations: Abdomen is soft.     Tenderness: There is no abdominal tenderness. There is no right CVA tenderness, left CVA tenderness or guarding.  Musculoskeletal:     Right lower leg: No edema.     Left lower leg: No edema.  Skin:    General: Skin is warm and dry.     Coloration: Skin is not pale.     Findings: No bruising or erythema.  Neurological:     General: No focal deficit present.     Mental Status: She is alert and oriented to person, place, and time. Mental status is at baseline.     Cranial Nerves: No cranial nerve deficit.     Sensory: No sensory deficit.     Motor: No weakness.     Coordination: Coordination normal.  Psychiatric:        Mood and Affect: Mood normal.     ED Results / Procedures / Treatments   Labs (all labs ordered are listed, but only abnormal results are displayed) Labs Reviewed  URINALYSIS, W/ REFLEX TO CULTURE (INFECTION  SUSPECTED) - Abnormal; Notable for the following components:      Result Value   APPearance HAZY (*)    Nitrite POSITIVE (*)    Leukocytes,Ua MODERATE (*)    Bacteria, UA RARE (*)    All other components within normal limits  BASIC METABOLIC PANEL - Abnormal; Notable for the following components:   Potassium 3.3 (*)    Glucose, Bld 102 (*)  All other components within normal limits  URINE CULTURE  CBC WITH DIFFERENTIAL/PLATELET    EKG None  Radiology DG Chest 2 View Result Date: 11/12/2023 CLINICAL DATA:  Pneumonia EXAM: CHEST - 2 VIEW COMPARISON:  11/04/2022 FINDINGS: Stable cardiomediastinal contours. Aortic atherosclerosis. No focal airspace consolidation, pleural effusion, or pneumothorax. IMPRESSION: No active cardiopulmonary disease. Electronically Signed   By: Duanne Guess D.O.   On: 11/12/2023 15:42    Procedures Procedures    Medications Ordered in ED Medications - No data to display  ED Course/ Medical Decision Making/ A&P                                 Medical Decision Making  Patient is a 73 year old female presents the ED today complaining of 2-day history of instability and malodorous urine, saying that this all started after her neurologist changed her Parkinson's medication.  Feels more unstable when taking the medication.  Recently hospitalized on 09/23/2023 in [place] for pneumonia, acute pulmonary edema, sepsis, CKD and was treated with IV antibiotics.  Patient's husband noted that she was confused overnight and had some urinary incontinence.  States that she had fallen today but did not hit head, no LOC, no upper extremity pain, no lower extremity pain, no back pain, no neck pain.  On physical exam, patient is a to be afebrile, no acute distress, speaking in full sentences.  Neuroexam is unremarkable with normal sensation bilaterally, decreased strength which is at baseline for patient, baseline gait, no arm drift, no aphasia or ataxia noted.  Labs  are significant for UTI with no signs of sepsis.  Urine culture pending at this time.  Will send patient with Keflex outpatient as patient vital signs remained stable at the course of her time here.  I believe patient is a to be discharged as she has good monitoring at home.  Patient and family expressed agreement understanding of plan.  I believe patient safe to be discharge at this time.  Differential diagnoses prior to evaluation: The emergent differential diagnosis includes, but is not limited to, UTI, pyelonephritis, nephrolithiasis, CVA, intracranial bleed, PE. This is not an exhaustive differential.   Past Medical History / Co-morbidities / Social History: HTN, paralysis agitans, peripheral neuropathy, LBBB, PVCs, cervical stenosis, sepsis, Parkinson's disease, hydronephrosis.  Cystoscopy with ureteral stent placement and 09/2021, ureteroscopy with holmium laser lithotripsy on 11/2021.  Additional history: Chart reviewed. Pertinent results include:   Seen on 09/23/2023 in [place] for pneumonia and acute pulmonary edema with sepsis.  Treated with IV antibiotics and discharged.  Lab Tests/Imaging studies: I personally interpreted labs/imaging and the pertinent results include:   CBC unremarkable BMP notable for mild hypokalemia 3.3 otherwise unremarkable UA notable for nitrates and leukocytes with bacteria and 21-50 WBCs per UA. Chest x-ray unremarkable I agree with the radiologist interpretation.  Medications: I ordered medication including outpatient Keflex.  I have reviewed the patients home medicines and have made adjustments as needed.    Disposition: After consideration of the diagnostic results and the patients response to treatment, I feel that the patient bit from discharge and treatment as above.   emergency department workup does not suggest an emergent condition requiring admission or immediate intervention beyond what has been performed at this time. The plan is: Keflex  for UTI, follow-up with PCP, follow-up with neurology, return for new or worsening symptoms. The patient is safe for discharge and has been instructed to return  immediately for worsening symptoms, change in symptoms or any other concerns.   Final Clinical Impression(s) / ED Diagnoses Final diagnoses:  Lower urinary tract infectious disease    Rx / DC Orders ED Discharge Orders          Ordered    cephALEXin (KEFLEX) 500 MG capsule  2 times daily        11/12/23 [address] [place], [place] 11/12/23 1657    Gerhard Munch, MD 11/12/23 [phone]

## 2023-11-12 NOTE — ED Triage Notes (Signed)
 Pt presents with granddaughter who reports that her pt's husband called her with concern regarding multiple falls today. Pt states that she has simply tripped several times, trying to catch herself on the ground. Denies any injuries or LOC. Per family member there is concern over UTI d/t urinary frequency and foul odor. Per husband pt had episode of confusion overnight and urinated in the bed which is not her baseline. Currently pt is A+Ox4.

## 2023-11-12 NOTE — Discharge Instructions (Addendum)
 You were seen today for UTI.  I am prescribing outpatient Keflex which you are to take as prescribed.  Take 2 times a day with food to avoid stomach upset for the next 7 days.  Follow-up with urology as well as neurology.  Low suspicion for any emergent process present this time.  I believe you are safe to discharge at this time.  He began to develop any sort of fever, worsening pain, painful urination, back pain, abdominal pain, return to the ED for further evaluation.

## 2023-11-14 LAB — URINE CULTURE: Culture: >=100000 — AB

## 2023-11-15 ENCOUNTER — Ambulatory Visit (HOSPITAL_BASED_OUTPATIENT_CLINIC_OR_DEPARTMENT_OTHER): Payer: Medicare Other | Admitting: Physical Therapy

## 2023-11-15 ENCOUNTER — Encounter (HOSPITAL_BASED_OUTPATIENT_CLINIC_OR_DEPARTMENT_OTHER): Payer: Self-pay | Admitting: Physical Therapy

## 2023-11-15 ENCOUNTER — Telehealth (HOSPITAL_BASED_OUTPATIENT_CLINIC_OR_DEPARTMENT_OTHER): Payer: Self-pay

## 2023-11-15 DIAGNOSIS — G20A1 Parkinson's disease without dyskinesia, without mention of fluctuations: Secondary | ICD-10-CM

## 2023-11-15 DIAGNOSIS — R2689 Other abnormalities of gait and mobility: Secondary | ICD-10-CM | POA: Diagnosis not present

## 2023-11-15 NOTE — Telephone Encounter (Signed)
 Post ED Visit - Positive Culture Follow-up  Culture report reviewed by antimicrobial stewardship pharmacist: [facility] []  Nathan Batchelder, Pharm.D. []  Celedonio Miyamoto, Pharm.D., BCPS AQ-ID []  Garvin Fila, Pharm.D., BCPS [x]  Georgina Pillion, Pharm.D., BCPS []  [place], [place].D., BCPS, AAHIVP []  Estella Husk, Pharm.D., BCPS, AAHIVP []  Lysle Pearl, PharmD, BCPS []  Phillips Climes, PharmD, BCPS []  Agapito Games, PharmD, BCPS []  Verlan Friends, PharmD []  Mervyn Gay, PharmD, BCPS []  Vinnie Level, PharmD  [facility] []  Len Childs, PharmD []  Greer Pickerel, PharmD []  Adalberto Cole, PharmD []  Perlie Gold, Rph []  Lonell Face) Jean Rosenthal, PharmD []  Earl Many, PharmD []  Junita Push, PharmD []  Dorna Leitz, PharmD []  Terrilee Files, PharmD []  Lynann Beaver, PharmD []  Keturah Barre, PharmD []  Loralee Pacas, PharmD []  Bernadene Person, PharmD   Positive urine culture  Treated with cephalexin, organism sensitive to the same and no further patient follow-up is required at this time.  "Okay"  Arvid Right 11/15/2023, 11:16 AM

## 2023-11-15 NOTE — Therapy (Signed)
 OUTPATIENT PHYSICAL THERAPY LOWER EXTREMITY TREATMENT   Patient Name: Morgan [name] MRN: [id number] DOB:12/11/1950, 73 y.o., female Today's Date: 11/15/2023      PT End of Session - 11/15/23 1019     Visit Number 42    Number of Visits 72    Date for PT Re-Evaluation 12/13/23    Progress Note Due on Visit 44    PT Start Time 1017    PT Stop Time 1057    PT Time Calculation (min) 40 min    Equipment Utilized During Treatment Gait belt    Activity Tolerance Patient tolerated treatment well    Behavior During Therapy WFL for tasks assessed/performed                    Past Medical History:  Diagnosis Date   Anxiety    Fibromyalgia    Headache    Hypercholesteremia    controlled on medication   Hyperlipidemia    Hypertension    no current meds   LBBB (left bundle branch block)    Melanoma (HCC)    MVP (mitral valve prolapse)    Osteopenia    Parkinson disease (HCC)    Pneumonia    SVD (spontaneous vaginal delivery)    x 2   Past Surgical History:  Procedure Laterality Date   BLEPHAROPLASTY     cold knife conization     COLONOSCOPY     FH colon CA   CYSTOSCOPY W/ URETERAL STENT PLACEMENT Left 10/18/2021   Procedure: CYSTOSCOPY WITH RETROGRADE PYELOGRAM, URETERAL STENT PLACEMENT;  Surgeon: Jerilee Field, MD;  Location: [facility];  Service: Urology;  Laterality: Left;   DILATATION & CURETTAGE/HYSTEROSCOPY WITH MYOSURE N/A 02/14/2018   Procedure: ATTEMPTED DILATATION & CURETTAGE/HYSTEROSCOPY WITH MYOSURE;  Surgeon: Gerald Leitz, MD;  Location: [facility];  Service: Gynecology;  Laterality: N/A;  polyp   melanoma cancer     removed from left leg   RECONSTRUCTION OF EYELID     TONSILLECTOMY     TUBAL LIGATION     URETEROSCOPY WITH HOLMIUM LASER LITHOTRIPSY Left 12/17/2021   Procedure: URETEROSCOPY/HOLMIUM LASER/STENT EXCHANGE;  Surgeon: Jerilee Field, MD;  Location: [facility];  Service: Urology;  Laterality: Left;   WISDOM TOOTH EXTRACTION     Patient  Active Problem List   Diagnosis Date Noted   COVID-19 virus infection 05/16/2022   HTN (hypertension) 10/20/2021   Physical deconditioning 10/20/2021   Hydronephrosis with infection 10/18/2021   Bacteremia due to Escherichia coli 10/18/2021   Severe sepsis (HCC) 10/17/2021   UTI (urinary tract infection) 10/17/2021   Acute metabolic encephalopathy 10/17/2021   Parkinson disease (HCC) 10/17/2021   Cervical stenosis (uterine cervix) 02/14/2018   Postmenopausal bleeding 02/14/2018   LBBB (left bundle branch block) 12/25/2015   PVCs (premature ventricular contractions) 12/25/2015   Hyperlipidemia 12/25/2015   Fibromyalgia 01/16/2014   Cholelithiasis 04/25/2013   Paralysis agitans (HCC) 01/15/2013   Hereditary and idiopathic peripheral neuropathy 01/15/2013     [name] MD PCP:   Cindi Carbon PROVIDER: Antoine Primas MD   REFERRING DIAG: Parkinsons   THERAPY DIAG:  Other abnormalities of gait and mobility  Parkinson's disease without dyskinesia or fluctuating manifestations (HCC)  Rationale for Evaluation and Treatment Rehabilitation  ONSET DATE: Several years   SUBJECTIVE:   SUBJECTIVE STATEMENT: Patient states recent UTI, had to go to hospital for antibiotics. Arrivied in [facility] today due to mobility issues.    PERTINENT HISTORY: Fibromyalgia; headaches, LBBB; osteopenia   PAIN:  Are you  having pain? no: NPRS scale: 0/10 Location: Pain description:    Aggravating factors: just comes and goes  Relieving factors: just comes and goes; sometimes ibuprofen works but she is limited as to how much she can take  PRECAUTIONS: Patient reports that because of her heart she will pass out at times.   WEIGHT BEARING RESTRICTIONS No  FALLS:  Has patient fallen in last 6 months? Yes.  6 falls but likley more. Last fall    LIVING ENVIRONMENT: Stairs to the second floor in her house but she dosen't do much  4 steps in the front  2 steps in the garage  OCCUPATION: retired    Presenter, broadcasting: reading; goes to an exercise class   PLOF: Independent with household mobility with device  PATIENT GOALS  To have less pain in her legs and to move better    OBJECTIVE:  (measurements taken at evaluation unless otherwise noted)   DIAGNOSTIC FINDINGS:   PATIENT SURVEYS:  FOTO    COGNITION:  Overall cognitive status: Within functional limits for tasks assessed     SENSATION: Denies paresthesias   POSTURE: No Significant postural limitations  PALPATION: No trigger points noted   LOWER EXTREMITY ROM:  Passive ROM Right eval Left eval  Hip flexion    Hip extension    Hip abduction    Hip adduction    Hip internal rotation    Hip external rotation    Knee flexion    Knee extension    Ankle dorsiflexion    Ankle plantarflexion    Ankle inversion    Ankle eversion     (Blank rows = not tested)  WNL  LOWER EXTREMITY MMT:  MMT Right eval Left eval Right  7/22 Left  7/2 Right  9/12 Left 9/12 Right 06/13/23 Left 06/13/23 Right 08/02/23 Left 08/02/23 Right 10/18/23 Left 10/18/23  Hip flexion 21.7 29.7 23.5 25.9 24.6 25.6 41.3 37.8 47 45.5 44.9 46  Hip extension              Hip abduction 27.6 18.5 19.1 8.7 21.2 14.4 29.9 29.5 35.5 35.3 25.7 35.5  Hip adduction              Hip internal rotation              Hip external rotation              Knee flexion              Knee extension 23.5 20.2 17.7 16.8 22.7 16.3 39.4 49.8 48.8 53.5 48 42.3  Ankle dorsiflexion              Ankle plantarflexion              Ankle inversion              Ankle eversion               (Blank rows = not tested)  GAIT: Decreased hip flexion; leans to the left; right hip frop. Patient required CGA while walking and min a when she turned for balance. No change from IE   Functional tests:  Sit to stand: cuing to catch balance. Uses momentum to stand.    5x sit to stand Balance: 13 sec   9/12 5x sit to stand 13 sec Narrow base min a  Narrow base eyes closed  min to mod a  Tandem stance Min a both ways.  Single leg stance 5 sec bilateral  06/13/23: 285 feet with walking stick, CGA  for safety  Reassessment 08/02/23:  258 feet with walking stick, CGA  for safety 5xSTS: 13.5 seconds with poor eccentric control  Reassessment 10/18/23:  - deferred today due to PT injury 5xSTS: 13.36 seconds with poor eccentric control, posterior lean with transfer/standing  BERG Balance Test          Date:      Sit to Stand 1 2 06/13/23: 2 08/02/23: 3 10/18/23: 3  Standing unsupported 3 3 3 3 3   Sitting with back unsupported but feet supported 1 3 4 4 4   Stand to sit  1 2 3 3 3   Transfers  2 1 2 2 3   Standing unsupported with eyes closed 3 3 3 3 3   Standing unsupported feet together 3 3 3 3 3   From standing position, reach forward with outstretched arm 1 2 3 3 3   From standing position, pick up object from floor 1 2 3 3 3   From standing position, turn and look behind over each shoulder 1 2 2 3 3   Turn 360 2 1 2 2 2   Standing unsupported, alternately place foot on step 1 1 2 2 2   Standing unsupported, one foot in front 1 1 2 2 2   Standing on one leg 0 0 2 1 2   Total:  21/56 26/56 34/56  33/56 34/56      TODAY'S TREATMENT: 11/15/23 Nustep level 5, seat 8, 6 minutes Bicep curl and shoulder press 3# 2 x 10 Shoulder flexion 1# 2 x 10  Seated ab iso with ball 2x 10 x 5 second holds Seated row GTB 2 x 15 Seated shoulder extension GTB 2 x 15   11/11/23 Nustep 5 minutes for dynamic warm up and conditioning level 5, seat 7 Seated shoulder press 4# 3 x 10  Seated punches 4# 3 x 10 Gait with RW 1 x 40 feet, 1 x 100 feet; gait with walking stick 1 x 100 Seated ab iso with ball 10 x 5 second holds Anterior stepping with overhead reach 2 x 12 Lateral stepping with overhead reach 2 x 10  11/08/23 Seated shoulder press 3# 2 x 10  Seated LAQ 2 x 10 with 5 second holds Seated hip adduction isometric 10 x 10 second holds Seated hip abduction isometric  10 x 10 second holds Seated bicep curls 3# bar 2 x 20   11/04/23 Nustep 5 minutes for dynamic warm up and conditioning level 5, seat 7 Hamstring curl machine 30# 2 x 10  Leg press machine 10# 2 x 10  Knee extension 10# 2 x 10 Hip adduction 40# 3 x 15 Hip abduction 40# 3 x 15  11/01/23  Nustep 5 minutes for dynamic warm up and conditioning level 5, seat 7   10/28/23  Nustep 5 minutes for dynamic warm up and conditioning level 5, seat 7 Hamstring curl machine 30# 2 x 10  Leg press machine 10# 2 x 10  Knee extension 10# 2 x 10 Calf machine 10# 2 x 10 Hip adduction 40# 2 x 15 Hip abduction 40# 2 x 15 Row GTB 3x15 High row GTB 2 x 15  10/25/23 STS with overhead reach 3 x 10 Nustep 5 minutes for dynamic warm up and conditioning level 5, seat 7 Lateral step up 4 inch 2 x 20 Seated tricep dips 3 x 10   10/21/23 Nustep 6 minutes for dynamic warm up and conditioning level 5, seat 7 STS with overhead  reach 3 x 10 Standing step taps 4 inch step 2 x 20 bilateral Seated shoulder press 4# 3 x 10  Seated tricep dips 3 x 10  LAQ 3# 1 x 10 with 5 second holds Anterior stepping with overhead reach 2 x 15  10/18/23 Reassessment Seated punches 4# 3 x 10 Seated shoulder press 4# 3 x 10  Seated bicep curls 4# 3 x 10  09/06/23 Nustep 6 minutes for dynamic warm up and conditioning level 5, seat 7 Active hamstring stretch 10 x 10 second holds Supine hamstring isometrics 10 x 5 second holds LAQ 3# 1 x 10 second holds Seated shoulder press 4# 3 x 10 Seated tricep dips 3 x 10    12/26 Nustep 6 minutes for dynamic warm up and conditioning level 5, seat 7 Punches 3 lbs 3x10  Seated punches 4# 3 x 10 Seated shoulder press 4# 3 x 10  Seated bicep curls 4# 3 x 10  Air-ex 2x30 seconds EO. Had to go to the bathroom after second set.   Gait: walked into the gym with CGA 30'x2  Walked to the bathroom 75'x2      12/23  Nustep 6 minutes for dynamic warm up and conditioning level 5, seat  7 Punches 3 lbs 3x10  Seated punches 4# 3 x 10 Seated shoulder press 4# 3 x 10  Seated bicep curls 4# 3 x 10  LAQ 3x10 red  March 3x10 red   Narrow base of support air-ex EO and EC 2x30  Heel toe on air-ex x30  Stnaidng march 2x10 on airpex    08/18/23 Nustep 6 minutes for dynamic warm up and conditioning level 5, seat 7 Seated punches 4# 3 x 10 Seated shoulder press 4# 3 x 10  Seated bicep curls 4# 3 x 10 Seated tricep dips 3 x 10  Anterior step with overhead reach 2 x 10 bilateral Lateral step with opening reach 2 x 10 bilateral NBOS on foam with anterior and lateral reaches 2 x 10   08/16/23 Nustep 6 minutes for dynamic warm up and conditioning level 5, seat 7 Seated punches 3# 3 x 10 Seated shoulder press 3# 3 x 10  Seated bicep curls 3# 3 x 10 Seated tricep dips 3 x 10  STS with overhead reach 3 x 10 Standing step taps 4 inch step 2 x 20 bilateral   08/04/23 Nustep 6 minutes for dynamic warm up and conditioning level 5, seat 7 Seated punches 3# 3 x 10 Seated shoulder press 3# 3 x 10  Seated small tricep dips 2 x 10 Seated bicep curls 3# 3 x 10 Standing lunges with UE support 2 x 10 bilateral Shoulder abduction 1# 2 x 10   PATIENT EDUCATION:  Education details: exercise rationale and technique   Person educated: Patient Education method: Explanation, Demonstration, Tactile cues, Verbal cues, Education comprehension: verbalized understanding, returned demonstration, verbal cues required, tactile cues required, and needs further education   HOME EXERCISE PROGRAM: Access Code: [email or web address]   URL: [email or web address] Date: 06/28/2023 - Seated March with Resistance  - 1 x daily - 7 x weekly - 3 sets - 15 reps - Seated Hip Abduction with Resistance  - 1 x daily - 7 x weekly - 3 sets - 15 reps - Seated Long Arc Quad  - 1 x daily - 7 x weekly - 3 sets - 15 reps - Shoulder External Rotation and Scapular Retraction with Resistance  - 1 x daily - 7 x  weekly -  2 sets - 10 reps - Seated Shoulder Horizontal Abduction with Resistance  - 1 x daily - 7 x weekly - 2 sets - 10 reps  08/11/23 - Seated Punches  - 1 x daily - 7 x weekly - 3 sets - 10 reps - Seated Shoulder Overhead Press with Dumbbells with PLB  - 1 x daily - 7 x weekly - 3 sets - 10 reps - Seated Bicep Curls Supinated with Dumbbells  - 1 x daily - 7 x weekly - 3 sets - 10 reps - Seated Shoulder Abduction with Dumbbells - Thumbs Up  - 1 x daily - 7 x weekly - 3 sets - 10 reps  ASSESSMENT:  CLINICAL IMPRESSION: Patient with increased fatigue today likely due to recent UTI and impaired mobility likely due to adjustment of med dosages. Patient with increased shuffled gait today. Intermittent rest breaks for fatigue with moderate fatigue (6/10) at end of session. Cueing  and assist required for short distance ambulation/transfers. Patient will continue to benefit from physical therapy in order to improve function and reduce impairment.    OBJECTIVE IMPAIRMENTS Abnormal gait, decreased activity tolerance, decreased balance, decreased mobility, difficulty walking, decreased strength, impaired tone, and pain.   ACTIVITY LIMITATIONS carrying, lifting, bending, standing, squatting, stairs, transfers, bed mobility, dressing, and locomotion level  PARTICIPATION LIMITATIONS: meal prep, cleaning, laundry, driving, shopping, community activity, and yard work  PERSONAL FACTORS Fibromyalgia; headaches, LBBB; osteopenia  are also affecting patient's functional outcome.   REHAB POTENTIAL: Good  CLINICAL DECISION MAKING: Evolving/moderate complexity increasing pain in her quads   EVALUATION COMPLEXITY: Moderate   GOALS: Goals reviewed with patient? Yes  SHORT TERM GOALS: Target date: 04/18/2023  Patient will transfer sit to stand smoothly and independently  Baseline: Goal status: depends on the day 9/12  2.  Patient will increase gross strength by 5lbs  Baseline:  Goal status: MET  3.   Patient will improve BERG score by 10  Baseline:  Goal status: MET LONG TERM GOALS: Target date:POC  Patient will go improve balance to CGA for all balance exercises to improve safety  Baseline:  Goal status: INITIAL  2.  Patient will report a 50% reduction in falls  Baseline:  Goal status: INITIAL  3.  Patient will have a full balance program  Baseline:  Goal status: INITIAL 4. Patient will score at least 38/56 on Berg balance scale in order to demonstrate improved balance to reduce the risk for falls at home and in the community.  Baseline:  Goal status: INITIAL   PLAN: PT FREQUENCY: 2x/week  PT DURATION: 8 weeks  PLANNED INTERVENTIONS: Therapeutic exercises, Therapeutic activity, Neuromuscular re-education, Balance training, Gait training, Patient/Family education, Self Care, Joint mobilization, Stair training, Aquatic Therapy, Cryotherapy, Moist heat, Taping, Manual therapy, and Re-evaluation  PLAN FOR NEXT SESSION: work on improving sit to stand, posterior, dynamic, and static balance. Work on general conditioning. Be aware of leg pain on any given day.    Wyman Songster, PT DPT 11/15/2023, 10:23 AM

## 2023-11-18 ENCOUNTER — Ambulatory Visit (HOSPITAL_BASED_OUTPATIENT_CLINIC_OR_DEPARTMENT_OTHER): Payer: Medicare Other | Admitting: Physical Therapy

## 2023-11-18 ENCOUNTER — Encounter (HOSPITAL_BASED_OUTPATIENT_CLINIC_OR_DEPARTMENT_OTHER): Payer: Self-pay | Admitting: Physical Therapy

## 2023-11-18 DIAGNOSIS — R2689 Other abnormalities of gait and mobility: Secondary | ICD-10-CM

## 2023-11-18 DIAGNOSIS — G20A1 Parkinson's disease without dyskinesia, without mention of fluctuations: Secondary | ICD-10-CM

## 2023-11-18 NOTE — Therapy (Signed)
 OUTPATIENT PHYSICAL THERAPY LOWER EXTREMITY TREATMENT   Patient Name: Morgan [name] MRN: [id number] DOB:05/19/1951, 73 y.o., female Today's Date: 11/18/2023      PT End of Session - 11/18/23 1049     Visit Number 43    Number of Visits 72    Date for PT Re-Evaluation 12/13/23    Progress Note Due on Visit 44    PT Start Time 1047    PT Stop Time 1126    PT Time Calculation (min) 39 min    Equipment Utilized During Treatment Gait belt    Activity Tolerance Patient tolerated treatment well    Behavior During Therapy WFL for tasks assessed/performed                    Past Medical History:  Diagnosis Date   Anxiety    Fibromyalgia    Headache    Hypercholesteremia    controlled on medication   Hyperlipidemia    Hypertension    no current meds   LBBB (left bundle branch block)    Melanoma (HCC)    MVP (mitral valve prolapse)    Osteopenia    Parkinson disease (HCC)    Pneumonia    SVD (spontaneous vaginal delivery)    x 2   Past Surgical History:  Procedure Laterality Date   BLEPHAROPLASTY     cold knife conization     COLONOSCOPY     FH colon CA   CYSTOSCOPY W/ URETERAL STENT PLACEMENT Left 10/18/2021   Procedure: CYSTOSCOPY WITH RETROGRADE PYELOGRAM, URETERAL STENT PLACEMENT;  Surgeon: Jerilee Field, MD;  Location: [facility];  Service: Urology;  Laterality: Left;   DILATATION & CURETTAGE/HYSTEROSCOPY WITH MYOSURE N/A 02/14/2018   Procedure: ATTEMPTED DILATATION & CURETTAGE/HYSTEROSCOPY WITH MYOSURE;  Surgeon: Gerald Leitz, MD;  Location: [facility];  Service: Gynecology;  Laterality: N/A;  polyp   melanoma cancer     removed from left leg   RECONSTRUCTION OF EYELID     TONSILLECTOMY     TUBAL LIGATION     URETEROSCOPY WITH HOLMIUM LASER LITHOTRIPSY Left 12/17/2021   Procedure: URETEROSCOPY/HOLMIUM LASER/STENT EXCHANGE;  Surgeon: Jerilee Field, MD;  Location: [facility];  Service: Urology;  Laterality: Left;   WISDOM TOOTH EXTRACTION     Patient  Active Problem List   Diagnosis Date Noted   COVID-19 virus infection 05/16/2022   HTN (hypertension) 10/20/2021   Physical deconditioning 10/20/2021   Hydronephrosis with infection 10/18/2021   Bacteremia due to Escherichia coli 10/18/2021   Severe sepsis (HCC) 10/17/2021   UTI (urinary tract infection) 10/17/2021   Acute metabolic encephalopathy 10/17/2021   Parkinson disease (HCC) 10/17/2021   Cervical stenosis (uterine cervix) 02/14/2018   Postmenopausal bleeding 02/14/2018   LBBB (left bundle branch block) 12/25/2015   PVCs (premature ventricular contractions) 12/25/2015   Hyperlipidemia 12/25/2015   Fibromyalgia 01/16/2014   Cholelithiasis 04/25/2013   Paralysis agitans (HCC) 01/15/2013   Hereditary and idiopathic peripheral neuropathy 01/15/2013     [name] MD PCP:   Cindi Carbon PROVIDER: Antoine Primas MD   REFERRING DIAG: Parkinsons   THERAPY DIAG:  Other abnormalities of gait and mobility  Parkinson's disease without dyskinesia or fluctuating manifestations (HCC)  Rationale for Evaluation and Treatment Rehabilitation  ONSET DATE: Several years   SUBJECTIVE:   SUBJECTIVE STATEMENT: Patient states walking with walking stick today. Feeling better than the other day.    PERTINENT HISTORY: Fibromyalgia; headaches, LBBB; osteopenia   PAIN:  Are you having pain? no: NPRS scale: 0/10  Location: Pain description:    Aggravating factors: just comes and goes  Relieving factors: just comes and goes; sometimes ibuprofen works but she is limited as to how much she can take  PRECAUTIONS: Patient reports that because of her heart she will pass out at times.   WEIGHT BEARING RESTRICTIONS No  FALLS:  Has patient fallen in last 6 months? Yes.  6 falls but likley more. Last fall    LIVING ENVIRONMENT: Stairs to the second floor in her house but she dosen't do much  4 steps in the front  2 steps in the garage  OCCUPATION: retired   Presenter, broadcasting: reading; goes to  an exercise class   PLOF: Independent with household mobility with device  PATIENT GOALS  To have less pain in her legs and to move better    OBJECTIVE:  (measurements taken at evaluation unless otherwise noted)   DIAGNOSTIC FINDINGS:   PATIENT SURVEYS:  FOTO    COGNITION:  Overall cognitive status: Within functional limits for tasks assessed     SENSATION: Denies paresthesias   POSTURE: No Significant postural limitations  PALPATION: No trigger points noted   LOWER EXTREMITY ROM:  Passive ROM Right eval Left eval  Hip flexion    Hip extension    Hip abduction    Hip adduction    Hip internal rotation    Hip external rotation    Knee flexion    Knee extension    Ankle dorsiflexion    Ankle plantarflexion    Ankle inversion    Ankle eversion     (Blank rows = not tested)  WNL  LOWER EXTREMITY MMT:  MMT Right eval Left eval Right  7/22 Left  7/2 Right  9/12 Left 9/12 Right 06/13/23 Left 06/13/23 Right 08/02/23 Left 08/02/23 Right 10/18/23 Left 10/18/23  Hip flexion 21.7 29.7 23.5 25.9 24.6 25.6 41.3 37.8 47 45.5 44.9 46  Hip extension              Hip abduction 27.6 18.5 19.1 8.7 21.2 14.4 29.9 29.5 35.5 35.3 25.7 35.5  Hip adduction              Hip internal rotation              Hip external rotation              Knee flexion              Knee extension 23.5 20.2 17.7 16.8 22.7 16.3 39.4 49.8 48.8 53.5 48 42.3  Ankle dorsiflexion              Ankle plantarflexion              Ankle inversion              Ankle eversion               (Blank rows = not tested)  GAIT: Decreased hip flexion; leans to the left; right hip frop. Patient required CGA while walking and min a when she turned for balance. No change from IE   Functional tests:  Sit to stand: cuing to catch balance. Uses momentum to stand.    5x sit to stand Balance: 13 sec   9/12 5x sit to stand 13 sec Narrow base min a  Narrow base eyes closed min to mod a  Tandem stance Min  a both ways.  Single leg stance 5 sec bilateral     06/13/23: 285 feet  with walking stick, CGA  for safety  Reassessment 08/02/23:  258 feet with walking stick, CGA  for safety 5xSTS: 13.5 seconds with poor eccentric control  Reassessment 10/18/23:  - deferred today due to PT injury 5xSTS: 13.36 seconds with poor eccentric control, posterior lean with transfer/standing  BERG Balance Test          Date:      Sit to Stand 1 2 06/13/23: 2 08/02/23: 3 10/18/23: 3  Standing unsupported 3 3 3 3 3   Sitting with back unsupported but feet supported 1 3 4 4 4   Stand to sit  1 2 3 3 3   Transfers  2 1 2 2 3   Standing unsupported with eyes closed 3 3 3 3 3   Standing unsupported feet together 3 3 3 3 3   From standing position, reach forward with outstretched arm 1 2 3 3 3   From standing position, pick up object from floor 1 2 3 3 3   From standing position, turn and look behind over each shoulder 1 2 2 3 3   Turn 360 2 1 2 2 2   Standing unsupported, alternately place foot on step 1 1 2 2 2   Standing unsupported, one foot in front 1 1 2 2 2   Standing on one leg 0 0 2 1 2   Total:  21/56 26/56 34/56  33/56 34/56      TODAY'S TREATMENT: 11/18/23 Nustep level 5, seat 8, 5 minutes STS with overhead reach 3x10  Seated bicep curl and shoulder press 4# 2 x 10 Anterior stepping with overhead reach 2 x 10 Lateral stepping with overhead reach 2 x 10 Shoulder flexion 1# 2 x 10  Seated shoulder abduction 1# 2 x 10 LAQ 3# 2x 10  11/15/23 Nustep level 5, seat 8, 6 minutes Bicep curl and shoulder press 3# 2 x 10 Shoulder flexion 1# 2 x 10  Seated ab iso with ball 2x 10 x 5 second holds Seated row GTB 2 x 15 Seated shoulder extension GTB 2 x 15   11/11/23 Nustep 5 minutes for dynamic warm up and conditioning level 5, seat 7 Seated shoulder press 4# 3 x 10  Seated punches 4# 3 x 10 Gait with RW 1 x 40 feet, 1 x 100 feet; gait with walking stick 1 x 100 Seated ab iso with ball 10 x 5 second  holds Anterior stepping with overhead reach 2 x 12 Lateral stepping with overhead reach 2 x 10  11/08/23 Seated shoulder press 3# 2 x 10  Seated LAQ 2 x 10 with 5 second holds Seated hip adduction isometric 10 x 10 second holds Seated hip abduction isometric 10 x 10 second holds Seated bicep curls 3# bar 2 x 20   11/04/23 Nustep 5 minutes for dynamic warm up and conditioning level 5, seat 7 Hamstring curl machine 30# 2 x 10  Leg press machine 10# 2 x 10  Knee extension 10# 2 x 10 Hip adduction 40# 3 x 15 Hip abduction 40# 3 x 15  11/01/23  Nustep 5 minutes for dynamic warm up and conditioning level 5, seat 7   10/28/23  Nustep 5 minutes for dynamic warm up and conditioning level 5, seat 7 Hamstring curl machine 30# 2 x 10  Leg press machine 10# 2 x 10  Knee extension 10# 2 x 10 Calf machine 10# 2 x 10 Hip adduction 40# 2 x 15 Hip abduction 40# 2 x 15 Row GTB 3x15 High row GTB 2 x  15  10/25/23 STS with overhead reach 3 x 10 Nustep 5 minutes for dynamic warm up and conditioning level 5, seat 7 Lateral step up 4 inch 2 x 20 Seated tricep dips 3 x 10   10/21/23 Nustep 6 minutes for dynamic warm up and conditioning level 5, seat 7 STS with overhead reach 3 x 10 Standing step taps 4 inch step 2 x 20 bilateral Seated shoulder press 4# 3 x 10  Seated tricep dips 3 x 10  LAQ 3# 1 x 10 with 5 second holds Anterior stepping with overhead reach 2 x 15  10/18/23 Reassessment Seated punches 4# 3 x 10 Seated shoulder press 4# 3 x 10  Seated bicep curls 4# 3 x 10  09/06/23 Nustep 6 minutes for dynamic warm up and conditioning level 5, seat 7 Active hamstring stretch 10 x 10 second holds Supine hamstring isometrics 10 x 5 second holds LAQ 3# 1 x 10 second holds Seated shoulder press 4# 3 x 10 Seated tricep dips 3 x 10     PATIENT EDUCATION:  Education details: exercise rationale and technique   Person educated: Patient Education method: Explanation, Demonstration, Tactile  cues, Verbal cues, Education comprehension: verbalized understanding, returned demonstration, verbal cues required, tactile cues required, and needs further education   HOME EXERCISE PROGRAM: Access Code: [email or web address]   URL: [email or web address] Date: 06/28/2023 - Seated March with Resistance  - 1 x daily - 7 x weekly - 3 sets - 15 reps - Seated Hip Abduction with Resistance  - 1 x daily - 7 x weekly - 3 sets - 15 reps - Seated Long Arc Quad  - 1 x daily - 7 x weekly - 3 sets - 15 reps - Shoulder External Rotation and Scapular Retraction with Resistance  - 1 x daily - 7 x weekly - 2 sets - 10 reps - Seated Shoulder Horizontal Abduction with Resistance  - 1 x daily - 7 x weekly - 2 sets - 10 reps  08/11/23 - Seated Punches  - 1 x daily - 7 x weekly - 3 sets - 10 reps - Seated Shoulder Overhead Press with Dumbbells with PLB  - 1 x daily - 7 x weekly - 3 sets - 10 reps - Seated Bicep Curls Supinated with Dumbbells  - 1 x daily - 7 x weekly - 3 sets - 10 reps - Seated Shoulder Abduction with Dumbbells - Thumbs Up  - 1 x daily - 7 x weekly - 3 sets - 10 reps  ASSESSMENT:  CLINICAL IMPRESSION: Patient improved mobility compared to last session and tolerates more dynamic exercise today with less fatigue. Frequent cueing required for exercise mechanics and gait. Patient will continue to benefit from physical therapy in order to improve function and reduce impairment.    OBJECTIVE IMPAIRMENTS Abnormal gait, decreased activity tolerance, decreased balance, decreased mobility, difficulty walking, decreased strength, impaired tone, and pain.   ACTIVITY LIMITATIONS carrying, lifting, bending, standing, squatting, stairs, transfers, bed mobility, dressing, and locomotion level  PARTICIPATION LIMITATIONS: meal prep, cleaning, laundry, driving, shopping, community activity, and yard work  PERSONAL FACTORS Fibromyalgia; headaches, LBBB; osteopenia  are also affecting patient's functional  outcome.   REHAB POTENTIAL: Good  CLINICAL DECISION MAKING: Evolving/moderate complexity increasing pain in her quads   EVALUATION COMPLEXITY: Moderate   GOALS: Goals reviewed with patient? Yes  SHORT TERM GOALS: Target date: 04/18/2023  Patient will transfer sit to stand smoothly and independently  Baseline: Goal status: depends on the day 9/12  2.  Patient will increase gross strength by 5lbs  Baseline:  Goal status: MET  3.  Patient will improve BERG score by 10  Baseline:  Goal status: MET LONG TERM GOALS: Target date:POC  Patient will go improve balance to CGA for all balance exercises to improve safety  Baseline:  Goal status: INITIAL  2.  Patient will report a 50% reduction in falls  Baseline:  Goal status: INITIAL  3.  Patient will have a full balance program  Baseline:  Goal status: INITIAL 4. Patient will score at least 38/56 on Berg balance scale in order to demonstrate improved balance to reduce the risk for falls at home and in the community.  Baseline:  Goal status: INITIAL   PLAN: PT FREQUENCY: 2x/week  PT DURATION: 8 weeks  PLANNED INTERVENTIONS: Therapeutic exercises, Therapeutic activity, Neuromuscular re-education, Balance training, Gait training, Patient/Family education, Self Care, Joint mobilization, Stair training, Aquatic Therapy, Cryotherapy, Moist heat, Taping, Manual therapy, and Re-evaluation  PLAN FOR NEXT SESSION: work on improving sit to stand, posterior, dynamic, and static balance. Work on general conditioning. Be aware of leg pain on any given day.    Wyman Songster, PT DPT 11/18/2023, 11:21 AM

## 2023-11-22 ENCOUNTER — Encounter (HOSPITAL_BASED_OUTPATIENT_CLINIC_OR_DEPARTMENT_OTHER): Payer: Self-pay | Admitting: Physical Therapy

## 2023-11-22 ENCOUNTER — Ambulatory Visit (HOSPITAL_BASED_OUTPATIENT_CLINIC_OR_DEPARTMENT_OTHER): Payer: Medicare Other | Admitting: Physical Therapy

## 2023-11-22 DIAGNOSIS — R2689 Other abnormalities of gait and mobility: Secondary | ICD-10-CM | POA: Diagnosis not present

## 2023-11-22 DIAGNOSIS — G20A1 Parkinson's disease without dyskinesia, without mention of fluctuations: Secondary | ICD-10-CM | POA: Diagnosis not present

## 2023-11-22 NOTE — Therapy (Signed)
 OUTPATIENT PHYSICAL THERAPY LOWER EXTREMITY TREATMENT   Patient Name: Morgan [name] MRN: [id number] DOB:May 28, 1951, 73 y.o., female Today's Date: 11/22/2023  Progress Note   Reporting Period 10/18/23 to 11/22/23   See note below for Objective Data and Assessment of Progress/Goals     PT End of Session - 11/22/23 1110     Visit Number 44    Number of Visits 72    Date for PT Re-Evaluation 12/13/23    Progress Note Due on Visit 54    PT Start Time 1105    PT Stop Time 1145    PT Time Calculation (min) 40 min    Equipment Utilized During Treatment Gait belt    Activity Tolerance Patient tolerated treatment well    Behavior During Therapy WFL for tasks assessed/performed                    Past Medical History:  Diagnosis Date   Anxiety    Fibromyalgia    Headache    Hypercholesteremia    controlled on medication   Hyperlipidemia    Hypertension    no current meds   LBBB (left bundle branch block)    Melanoma (HCC)    MVP (mitral valve prolapse)    Osteopenia    Parkinson disease (HCC)    Pneumonia    SVD (spontaneous vaginal delivery)    x 2   Past Surgical History:  Procedure Laterality Date   BLEPHAROPLASTY     cold knife conization     COLONOSCOPY     FH colon CA   CYSTOSCOPY W/ URETERAL STENT PLACEMENT Left 10/18/2021   Procedure: CYSTOSCOPY WITH RETROGRADE PYELOGRAM, URETERAL STENT PLACEMENT;  Surgeon: Jerilee Field, MD;  Location: [facility];  Service: Urology;  Laterality: Left;   DILATATION & CURETTAGE/HYSTEROSCOPY WITH MYOSURE N/A 02/14/2018   Procedure: ATTEMPTED DILATATION & CURETTAGE/HYSTEROSCOPY WITH MYOSURE;  Surgeon: Gerald Leitz, MD;  Location: [facility];  Service: Gynecology;  Laterality: N/A;  polyp   melanoma cancer     removed from left leg   RECONSTRUCTION OF EYELID     TONSILLECTOMY     TUBAL LIGATION     URETEROSCOPY WITH HOLMIUM LASER LITHOTRIPSY Left 12/17/2021   Procedure: URETEROSCOPY/HOLMIUM LASER/STENT EXCHANGE;   Surgeon: Jerilee Field, MD;  Location: [facility];  Service: Urology;  Laterality: Left;   WISDOM TOOTH EXTRACTION     Patient Active Problem List   Diagnosis Date Noted   COVID-19 virus infection 05/16/2022   HTN (hypertension) 10/20/2021   Physical deconditioning 10/20/2021   Hydronephrosis with infection 10/18/2021   Bacteremia due to Escherichia coli 10/18/2021   Severe sepsis (HCC) 10/17/2021   UTI (urinary tract infection) 10/17/2021   Acute metabolic encephalopathy 10/17/2021   Parkinson disease (HCC) 10/17/2021   Cervical stenosis (uterine cervix) 02/14/2018   Postmenopausal bleeding 02/14/2018   LBBB (left bundle branch block) 12/25/2015   PVCs (premature ventricular contractions) 12/25/2015   Hyperlipidemia 12/25/2015   Fibromyalgia 01/16/2014   Cholelithiasis 04/25/2013   Paralysis agitans (HCC) 01/15/2013   Hereditary and idiopathic peripheral neuropathy 01/15/2013     [name] MD PCP:   Cindi Carbon PROVIDER: Antoine Primas MD   REFERRING DIAG: Parkinsons   THERAPY DIAG:  Other abnormalities of gait and mobility  Parkinson's disease without dyskinesia or fluctuating manifestations (HCC)  Rationale for Evaluation and Treatment Rehabilitation  ONSET DATE: Several years   SUBJECTIVE:   SUBJECTIVE STATEMENT: Patient states trying to rehydrate better today. Feels that she is getting better  after setback from being sick. Better since switching back on meds. Last fall maybe 2 weeks ago.    PERTINENT HISTORY: Fibromyalgia; headaches, LBBB; osteopenia   PAIN:  Are you having pain? no: NPRS scale: 0/10 Location: Pain description:    Aggravating factors: just comes and goes  Relieving factors: just comes and goes; sometimes ibuprofen works but she is limited as to how much she can take  PRECAUTIONS: Patient reports that because of her heart she will pass out at times.   WEIGHT BEARING RESTRICTIONS No  FALLS:  Has patient fallen in last 6 months? Yes.   6 falls but likley more. Last fall    LIVING ENVIRONMENT: Stairs to the second floor in her house but she dosen't do much  4 steps in the front  2 steps in the garage  OCCUPATION: retired   Presenter, broadcasting: reading; goes to an exercise class   PLOF: Independent with household mobility with device  PATIENT GOALS  To have less pain in her legs and to move better    OBJECTIVE:  (measurements taken at evaluation unless otherwise noted)   DIAGNOSTIC FINDINGS:   PATIENT SURVEYS:  FOTO    COGNITION:  Overall cognitive status: Within functional limits for tasks assessed     SENSATION: Denies paresthesias   POSTURE: No Significant postural limitations  PALPATION: No trigger points noted   LOWER EXTREMITY ROM:  Passive ROM Right eval Left eval  Hip flexion    Hip extension    Hip abduction    Hip adduction    Hip internal rotation    Hip external rotation    Knee flexion    Knee extension    Ankle dorsiflexion    Ankle plantarflexion    Ankle inversion    Ankle eversion     (Blank rows = not tested)  WNL  LOWER EXTREMITY MMT:  MMT Right eval Left eval Right  7/22 Left  7/2 Right  9/12 Left 9/12 Right 06/13/23 Left 06/13/23 Right 08/02/23 Left 08/02/23 Right 10/18/23 Left 10/18/23 Right  Left   Hip flexion 21.7 29.7 23.5 25.9 24.6 25.6 41.3 37.8 47 45.5 44.9 46    Hip extension                Hip abduction 27.6 18.5 19.1 8.7 21.2 14.4 29.9 29.5 35.5 35.3 25.7 35.5    Hip adduction                Hip internal rotation                Hip external rotation                Knee flexion                Knee extension 23.5 20.2 17.7 16.8 22.7 16.3 39.4 49.8 48.8 53.5 48 42.3    Ankle dorsiflexion                Ankle plantarflexion                Ankle inversion                Ankle eversion                 (Blank rows = not tested)  GAIT: Decreased hip flexion; leans to the left; right hip frop. Patient required CGA while walking and min a when she turned for  balance. No change from IE   Functional  tests:  Sit to stand: cuing to catch balance. Uses momentum to stand.    5x sit to stand Balance: 13 sec   9/12 5x sit to stand 13 sec Narrow base min a  Narrow base eyes closed min to mod a  Tandem stance Min a both ways.  Single leg stance 5 sec bilateral     06/13/23: 285 feet with walking stick, CGA  for safety  Reassessment 08/02/23:  258 feet with walking stick, CGA  for safety 5xSTS: 13.5 seconds with poor eccentric control  Reassessment 10/18/23:  - deferred today due to PT injury 5xSTS: 13.36 seconds with poor eccentric control, posterior lean with transfer/standing  Reassessment 10/18/23:  - deferred today due to PT injury 5xSTS: 10.97 seconds with 3/4 STS each rep, - took 4 attempts for patient to complete that due to impaired initation poor eccentric control, posterior lean with transfer/standing  BERG Balance Test          Date:      Sit to Stand 1 2 06/13/23: 2 08/02/23: 3 10/18/23: 3 11/22/23: 3  Standing unsupported 3 3 3 3 3 3   Sitting with back unsupported but feet supported 1 3 4 4 4 4   Stand to sit  1 2 3 3 3 1   Transfers  2 1 2 2 3 2   Standing unsupported with eyes closed 3 3 3 3 3 3   Standing unsupported feet together 3 3 3 3 3 3   From standing position, reach forward with outstretched arm 1 2 3 3 3 3   From standing position, pick up object from floor 1 2 3 3 3 3   From standing position, turn and look behind over each shoulder 1 2 2 3 3 3   Turn 360 2 1 2 2 2 2   Standing unsupported, alternately place foot on step 1 1 2 2 2 2   Standing unsupported, one foot in front 1 1 2 2 2 2   Standing on one leg 0 0 2 1 2 1   Total:  21/56 26/56 34/56  33/56 36/56 35/56       TODAY'S TREATMENT: 11/22/23 Nustep level 5, seat 8, 10 minutes with subjective and POC discussion with patient/spouse Reassessment   11/18/23 Nustep level 5, seat 8, 5 minutes STS with overhead reach 3x10  Seated bicep curl and shoulder  press 4# 2 x 10 Anterior stepping with overhead reach 2 x 10 Lateral stepping with overhead reach 2 x 10 Shoulder flexion 1# 2 x 10  Seated shoulder abduction 1# 2 x 10 LAQ 3# 2x 10  11/15/23 Nustep level 5, seat 8, 6 minutes Bicep curl and shoulder press 3# 2 x 10 Shoulder flexion 1# 2 x 10  Seated ab iso with ball 2x 10 x 5 second holds Seated row GTB 2 x 15 Seated shoulder extension GTB 2 x 15   11/11/23 Nustep 5 minutes for dynamic warm up and conditioning level 5, seat 7 Seated shoulder press 4# 3 x 10  Seated punches 4# 3 x 10 Gait with RW 1 x 40 feet, 1 x 100 feet; gait with walking stick 1 x 100 Seated ab iso with ball 10 x 5 second holds Anterior stepping with overhead reach 2 x 12 Lateral stepping with overhead reach 2 x 10  11/08/23 Seated shoulder press 3# 2 x 10  Seated LAQ 2 x 10 with 5 second holds Seated hip adduction isometric 10 x 10 second holds Seated hip abduction isometric 10 x 10 second  holds Seated bicep curls 3# bar 2 x 20   11/04/23 Nustep 5 minutes for dynamic warm up and conditioning level 5, seat 7 Hamstring curl machine 30# 2 x 10  Leg press machine 10# 2 x 10  Knee extension 10# 2 x 10 Hip adduction 40# 3 x 15 Hip abduction 40# 3 x 15  11/01/23  Nustep 5 minutes for dynamic warm up and conditioning level 5, seat 7   10/28/23  Nustep 5 minutes for dynamic warm up and conditioning level 5, seat 7 Hamstring curl machine 30# 2 x 10  Leg press machine 10# 2 x 10  Knee extension 10# 2 x 10 Calf machine 10# 2 x 10 Hip adduction 40# 2 x 15 Hip abduction 40# 2 x 15 Row GTB 3x15 High row GTB 2 x 15  10/25/23 STS with overhead reach 3 x 10 Nustep 5 minutes for dynamic warm up and conditioning level 5, seat 7 Lateral step up 4 inch 2 x 20 Seated tricep dips 3 x 10   10/21/23 Nustep 6 minutes for dynamic warm up and conditioning level 5, seat 7 STS with overhead reach 3 x 10 Standing step taps 4 inch step 2 x 20 bilateral Seated shoulder press  4# 3 x 10  Seated tricep dips 3 x 10  LAQ 3# 1 x 10 with 5 second holds Anterior stepping with overhead reach 2 x 15  10/18/23 Reassessment Seated punches 4# 3 x 10 Seated shoulder press 4# 3 x 10  Seated bicep curls 4# 3 x 10  09/06/23 Nustep 6 minutes for dynamic warm up and conditioning level 5, seat 7 Active hamstring stretch 10 x 10 second holds Supine hamstring isometrics 10 x 5 second holds LAQ 3# 1 x 10 second holds Seated shoulder press 4# 3 x 10 Seated tricep dips 3 x 10     PATIENT EDUCATION:  Education details: exercise rationale and technique   Person educated: Patient Education method: Explanation, Demonstration, Tactile cues, Verbal cues, Education comprehension: verbalized understanding, returned demonstration, verbal cues required, tactile cues required, and needs further education   HOME EXERCISE PROGRAM: Access Code: [email or web address]   URL: [email or web address] Date: 06/28/2023 - Seated March with Resistance  - 1 x daily - 7 x weekly - 3 sets - 15 reps - Seated Hip Abduction with Resistance  - 1 x daily - 7 x weekly - 3 sets - 15 reps - Seated Long Arc Quad  - 1 x daily - 7 x weekly - 3 sets - 15 reps - Shoulder External Rotation and Scapular Retraction with Resistance  - 1 x daily - 7 x weekly - 2 sets - 10 reps - Seated Shoulder Horizontal Abduction with Resistance  - 1 x daily - 7 x weekly - 2 sets - 10 reps  08/11/23 - Seated Punches  - 1 x daily - 7 x weekly - 3 sets - 10 reps - Seated Shoulder Overhead Press with Dumbbells with PLB  - 1 x daily - 7 x weekly - 3 sets - 10 reps - Seated Bicep Curls Supinated with Dumbbells  - 1 x daily - 7 x weekly - 3 sets - 10 reps - Seated Shoulder Abduction with Dumbbells - Thumbs Up  - 1 x daily - 7 x weekly - 3 sets - 10 reps  ASSESSMENT:  CLINICAL IMPRESSION: Patient has met 2/3 short term goals and 0/3 long term goals with ability to complete HEP and improvement in strength and balance. Remaining  goals not met due to continued deficits in symptoms, strength, ROM, activity tolerance, gait, balance, and functional mobility. Patient with impaired performance in testing today with impaired motor control and balance and difficulty initiating prior tasks she was doing very well with such as STS. Patient has made good progress toward remaining goals. Patient continues to require skilled care for safety, balance and to reduce risk of fall/injury as she requires cueing frequently with fluctuating symptoms and remains at a high risk for falls. Strength not checked today due to time constraints.  Patient will continue to benefit from skilled physical therapy in order to improve function and reduce impairment.   OBJECTIVE IMPAIRMENTS Abnormal gait, decreased activity tolerance, decreased balance, decreased mobility, difficulty walking, decreased strength, impaired tone, and pain.   ACTIVITY LIMITATIONS carrying, lifting, bending, standing, squatting, stairs, transfers, bed mobility, dressing, and locomotion level  PARTICIPATION LIMITATIONS: meal prep, cleaning, laundry, driving, shopping, community activity, and yard work  PERSONAL FACTORS Fibromyalgia; headaches, LBBB; osteopenia  are also affecting patient's functional outcome.   REHAB POTENTIAL: Good  CLINICAL DECISION MAKING: Evolving/moderate complexity increasing pain in her quads   EVALUATION COMPLEXITY: Moderate   GOALS: Goals reviewed with patient? Yes  SHORT TERM GOALS: Target date: 04/18/2023  Patient will transfer sit to stand smoothly and independently  Baseline: Goal status: depends on the day 9/12  2.  Patient will increase gross strength by 5lbs  Baseline:  Goal status: MET  3.  Patient will improve BERG score by 10  Baseline:  Goal status: MET LONG TERM GOALS: Target date:POC  Patient will go improve balance to CGA for all balance exercises to improve safety  Baseline:  Goal status: INITIAL  2.  Patient will report a  50% reduction in falls  Baseline:  Goal status: INITIAL  3.  Patient will have a full balance program  Baseline:  Goal status: INITIAL 4. Patient will score at least 38/56 on Berg balance scale in order to demonstrate improved balance to reduce the risk for falls at home and in the community.  Baseline:  Goal status: INITIAL   PLAN: PT FREQUENCY: 2x/week  PT DURATION: 8 weeks  PLANNED INTERVENTIONS: Therapeutic exercises, Therapeutic activity, Neuromuscular re-education, Balance training, Gait training, Patient/Family education, Self Care, Joint mobilization, Stair training, Aquatic Therapy, Cryotherapy, Moist heat, Taping, Manual therapy, and Re-evaluation  PLAN FOR NEXT SESSION: work on improving sit to stand, posterior, dynamic, and static balance. Work on general conditioning. Be aware of leg pain on any given day.    Wyman Songster, PT DPT 11/22/2023, 11:46 AM

## 2023-11-25 ENCOUNTER — Ambulatory Visit (HOSPITAL_BASED_OUTPATIENT_CLINIC_OR_DEPARTMENT_OTHER): Payer: Medicare Other | Admitting: Physical Therapy

## 2023-11-25 ENCOUNTER — Encounter (HOSPITAL_BASED_OUTPATIENT_CLINIC_OR_DEPARTMENT_OTHER): Payer: Self-pay | Admitting: Physical Therapy

## 2023-11-25 DIAGNOSIS — G20A1 Parkinson's disease without dyskinesia, without mention of fluctuations: Secondary | ICD-10-CM | POA: Diagnosis not present

## 2023-11-25 DIAGNOSIS — R2689 Other abnormalities of gait and mobility: Secondary | ICD-10-CM | POA: Diagnosis not present

## 2023-11-25 NOTE — Therapy (Signed)
 OUTPATIENT PHYSICAL THERAPY LOWER EXTREMITY TREATMENT   Patient Name: Morgan [name] MRN: [id number] DOB:[birth year], 73 y.o., female Today's Date: 11/25/2023      PT End of Session - 11/25/23 1048     Visit Number 45    Number of Visits 72    Date for PT Re-Evaluation 12/13/23    Progress Note Due on Visit 54    PT Start Time 1047    PT Stop Time 1103    PT Time Calculation (min) 16 min    Equipment Utilized During Treatment Gait belt    Activity Tolerance Patient tolerated treatment well    Behavior During Therapy WFL for tasks assessed/performed                    Past Medical History:  Diagnosis Date   Anxiety    Fibromyalgia    Headache    Hypercholesteremia    controlled on medication   Hyperlipidemia    Hypertension    no current meds   LBBB (left bundle branch block)    Melanoma (HCC)    MVP (mitral valve prolapse)    Osteopenia    Parkinson disease (HCC)    Pneumonia    SVD (spontaneous vaginal delivery)    x 2   Past Surgical History:  Procedure Laterality Date   BLEPHAROPLASTY     cold knife conization     COLONOSCOPY     FH colon CA   CYSTOSCOPY W/ URETERAL STENT PLACEMENT Left 10/18/2021   Procedure: CYSTOSCOPY WITH RETROGRADE PYELOGRAM, URETERAL STENT PLACEMENT;  Surgeon: Jerilee Field, MD;  Location: [facility];  Service: Urology;  Laterality: Left;   DILATATION & CURETTAGE/HYSTEROSCOPY WITH MYOSURE N/A 02/14/2018   Procedure: ATTEMPTED DILATATION & CURETTAGE/HYSTEROSCOPY WITH MYOSURE;  Surgeon: Gerald Leitz, MD;  Location: [facility];  Service: Gynecology;  Laterality: N/A;  polyp   melanoma cancer     removed from left leg   RECONSTRUCTION OF EYELID     TONSILLECTOMY     TUBAL LIGATION     URETEROSCOPY WITH HOLMIUM LASER LITHOTRIPSY Left 12/17/2021   Procedure: URETEROSCOPY/HOLMIUM LASER/STENT EXCHANGE;  Surgeon: Jerilee Field, MD;  Location: [facility];  Service: Urology;  Laterality: Left;   WISDOM TOOTH EXTRACTION     Patient  Active Problem List   Diagnosis Date Noted   COVID-19 virus infection 05/16/2022   HTN (hypertension) 10/20/2021   Physical deconditioning 10/20/2021   Hydronephrosis with infection 10/18/2021   Bacteremia due to Escherichia coli 10/18/2021   Severe sepsis (HCC) 10/17/2021   UTI (urinary tract infection) 10/17/2021   Acute metabolic encephalopathy 10/17/2021   Parkinson disease (HCC) 10/17/2021   Cervical stenosis (uterine cervix) 02/14/2018   Postmenopausal bleeding 02/14/2018   LBBB (left bundle branch block) 12/25/2015   PVCs (premature ventricular contractions) 12/25/2015   Hyperlipidemia 12/25/2015   Fibromyalgia 01/16/2014   Cholelithiasis 04/25/2013   Paralysis agitans (HCC) 01/15/2013   Hereditary and idiopathic peripheral neuropathy 01/15/2013     [name] MD PCP:   Cindi Carbon PROVIDER: Antoine Primas MD   REFERRING DIAG: Parkinsons   THERAPY DIAG:  Other abnormalities of gait and mobility  Parkinson's disease without dyskinesia or fluctuating manifestations (HCC)  Rationale for Evaluation and Treatment Rehabilitation  ONSET DATE: Several years   SUBJECTIVE:   SUBJECTIVE STATEMENT: Patient states was doing good until today. Feels half asleep today. Changed meds up a bit. Larey Seat the other day while going too fast with her hands full. Wanted to try PT today but  not sure how its going to go. Just went onto another antibiotic.   PERTINENT HISTORY: Fibromyalgia; headaches, LBBB; osteopenia   PAIN:  Are you having pain? no: NPRS scale: 0/10 Location: Pain description:    Aggravating factors: just comes and goes  Relieving factors: just comes and goes; sometimes ibuprofen works but she is limited as to how much she can take  PRECAUTIONS: Patient reports that because of her heart she will pass out at times.   WEIGHT BEARING RESTRICTIONS No  FALLS:  Has patient fallen in last 6 months? Yes.  6 falls but likley more. Last fall    LIVING  ENVIRONMENT: Stairs to the second floor in her house but she dosen't do much  4 steps in the front  2 steps in the garage  OCCUPATION: retired   Presenter, broadcasting: reading; goes to an exercise class   PLOF: Independent with household mobility with device  PATIENT GOALS  To have less pain in her legs and to move better    OBJECTIVE:  (measurements taken at evaluation unless otherwise noted)   DIAGNOSTIC FINDINGS:   PATIENT SURVEYS:  FOTO    COGNITION:  Overall cognitive status: Within functional limits for tasks assessed     SENSATION: Denies paresthesias   POSTURE: No Significant postural limitations  PALPATION: No trigger points noted   LOWER EXTREMITY ROM:  Passive ROM Right eval Left eval  Hip flexion    Hip extension    Hip abduction    Hip adduction    Hip internal rotation    Hip external rotation    Knee flexion    Knee extension    Ankle dorsiflexion    Ankle plantarflexion    Ankle inversion    Ankle eversion     (Blank rows = not tested)  WNL  LOWER EXTREMITY MMT:  MMT Right eval Left eval Right  7/22 Left  7/2 Right  9/12 Left 9/12 Right 06/13/23 Left 06/13/23 Right 08/02/23 Left 08/02/23 Right 10/18/23 Left 10/18/23 Right  Left   Hip flexion 21.7 29.7 23.5 25.9 24.6 25.6 41.3 37.8 47 45.5 44.9 46    Hip extension                Hip abduction 27.6 18.5 19.1 8.7 21.2 14.4 29.9 29.5 35.5 35.3 25.7 35.5    Hip adduction                Hip internal rotation                Hip external rotation                Knee flexion                Knee extension 23.5 20.2 17.7 16.8 22.7 16.3 39.4 49.8 48.8 53.5 48 42.3    Ankle dorsiflexion                Ankle plantarflexion                Ankle inversion                Ankle eversion                 (Blank rows = not tested)  GAIT: Decreased hip flexion; leans to the left; right hip frop. Patient required CGA while walking and min a when she turned for balance. No change from IE   Functional  tests:  Sit to stand: cuing  to catch balance. Uses momentum to stand.    5x sit to stand Balance: 13 sec   9/12 5x sit to stand 13 sec Narrow base min a  Narrow base eyes closed min to mod a  Tandem stance Min a both ways.  Single leg stance 5 sec bilateral     06/13/23: 285 feet with walking stick, CGA  for safety  Reassessment 08/02/23:  258 feet with walking stick, CGA  for safety 5xSTS: 13.5 seconds with poor eccentric control  Reassessment 10/18/23:  - deferred today due to PT injury 5xSTS: 13.36 seconds with poor eccentric control, posterior lean with transfer/standing  Reassessment 10/18/23:  - deferred today due to PT injury 5xSTS: 10.97 seconds with 3/4 STS each rep, - took 4 attempts for patient to complete that due to impaired initation poor eccentric control, posterior lean with transfer/standing  BERG Balance Test          Date:      Sit to Stand 1 2 06/13/23: 2 08/02/23: 3 10/18/23: 3 11/22/23: 3  Standing unsupported 3 3 3 3 3 3   Sitting with back unsupported but feet supported 1 3 4 4 4 4   Stand to sit  1 2 3 3 3 1   Transfers  2 1 2 2 3 2   Standing unsupported with eyes closed 3 3 3 3 3 3   Standing unsupported feet together 3 3 3 3 3 3   From standing position, reach forward with outstretched arm 1 2 3 3 3 3   From standing position, pick up object from floor 1 2 3 3 3 3   From standing position, turn and look behind over each shoulder 1 2 2 3 3 3   Turn 360 2 1 2 2 2 2   Standing unsupported, alternately place foot on step 1 1 2 2 2 2   Standing unsupported, one foot in front 1 1 2 2 2 2   Standing on one leg 0 0 2 1 2 1   Total:  21/56 26/56 34/56  33/56 36/56 35/56       TODAY'S TREATMENT:  11/25/23 Nustep level 5, seat 8, 5 minutes     11/22/23 Nustep level 5, seat 8, 10 minutes with subjective and POC discussion with patient/spouse Reassessment   11/18/23 Nustep level 5, seat 8, 5 minutes STS with overhead reach 3x10  Seated bicep curl  and shoulder press 4# 2 x 10 Anterior stepping with overhead reach 2 x 10 Lateral stepping with overhead reach 2 x 10 Shoulder flexion 1# 2 x 10  Seated shoulder abduction 1# 2 x 10 LAQ 3# 2x 10  11/15/23 Nustep level 5, seat 8, 6 minutes Bicep curl and shoulder press 3# 2 x 10 Shoulder flexion 1# 2 x 10  Seated ab iso with ball 2x 10 x 5 second holds Seated row GTB 2 x 15 Seated shoulder extension GTB 2 x 15   11/11/23 Nustep 5 minutes for dynamic warm up and conditioning level 5, seat 7 Seated shoulder press 4# 3 x 10  Seated punches 4# 3 x 10 Gait with RW 1 x 40 feet, 1 x 100 feet; gait with walking stick 1 x 100 Seated ab iso with ball 10 x 5 second holds Anterior stepping with overhead reach 2 x 12 Lateral stepping with overhead reach 2 x 10  11/08/23 Seated shoulder press 3# 2 x 10  Seated LAQ 2 x 10 with 5 second holds Seated hip adduction isometric 10 x 10 second holds Seated  hip abduction isometric 10 x 10 second holds Seated bicep curls 3# bar 2 x 20   11/04/23 Nustep 5 minutes for dynamic warm up and conditioning level 5, seat 7 Hamstring curl machine 30# 2 x 10  Leg press machine 10# 2 x 10  Knee extension 10# 2 x 10 Hip adduction 40# 3 x 15 Hip abduction 40# 3 x 15  11/01/23  Nustep 5 minutes for dynamic warm up and conditioning level 5, seat 7   10/28/23  Nustep 5 minutes for dynamic warm up and conditioning level 5, seat 7 Hamstring curl machine 30# 2 x 10  Leg press machine 10# 2 x 10  Knee extension 10# 2 x 10 Calf machine 10# 2 x 10 Hip adduction 40# 2 x 15 Hip abduction 40# 2 x 15 Row GTB 3x15 High row GTB 2 x 15  10/25/23 STS with overhead reach 3 x 10 Nustep 5 minutes for dynamic warm up and conditioning level 5, seat 7 Lateral step up 4 inch 2 x 20 Seated tricep dips 3 x 10   10/21/23 Nustep 6 minutes for dynamic warm up and conditioning level 5, seat 7 STS with overhead reach 3 x 10 Standing step taps 4 inch step 2 x 20 bilateral Seated  shoulder press 4# 3 x 10  Seated tricep dips 3 x 10  LAQ 3# 1 x 10 with 5 second holds Anterior stepping with overhead reach 2 x 15  10/18/23 Reassessment Seated punches 4# 3 x 10 Seated shoulder press 4# 3 x 10  Seated bicep curls 4# 3 x 10  09/06/23 Nustep 6 minutes for dynamic warm up and conditioning level 5, seat 7 Active hamstring stretch 10 x 10 second holds Supine hamstring isometrics 10 x 5 second holds LAQ 3# 1 x 10 second holds Seated shoulder press 4# 3 x 10 Seated tricep dips 3 x 10     PATIENT EDUCATION:  Education details: exercise rationale and technique   Person educated: Patient Education method: Explanation, Demonstration, Tactile cues, Verbal cues, Education comprehension: verbalized understanding, returned demonstration, verbal cues required, tactile cues required, and needs further education   HOME EXERCISE PROGRAM: Access Code: [email or web address]   URL: [email or web address] Date: 06/28/2023 - Seated March with Resistance  - 1 x daily - 7 x weekly - 3 sets - 15 reps - Seated Hip Abduction with Resistance  - 1 x daily - 7 x weekly - 3 sets - 15 reps - Seated Long Arc Quad  - 1 x daily - 7 x weekly - 3 sets - 15 reps - Shoulder External Rotation and Scapular Retraction with Resistance  - 1 x daily - 7 x weekly - 2 sets - 10 reps - Seated Shoulder Horizontal Abduction with Resistance  - 1 x daily - 7 x weekly - 2 sets - 10 reps  08/11/23 - Seated Punches  - 1 x daily - 7 x weekly - 3 sets - 10 reps - Seated Shoulder Overhead Press with Dumbbells with PLB  - 1 x daily - 7 x weekly - 3 sets - 10 reps - Seated Bicep Curls Supinated with Dumbbells  - 1 x daily - 7 x weekly - 3 sets - 10 reps - Seated Shoulder Abduction with Dumbbells - Thumbs Up  - 1 x daily - 7 x weekly - 3 sets - 10 reps  ASSESSMENT:  CLINICAL IMPRESSION: Patient with increased unsteadiness and shuffled gait. Nustep for warm up with hopes of booster taken 20  minutes prior to session  would help symptoms. Patient with very slow cadence on nustep and overall appears very fatigued with limited improvement in cadence despite frequent cueing. Patient with eyes closed entire time stating hard to keep them open today. Patient with limited ability to participate today requesting to end session early as she was not feeling well, typically patient able to tolerate additional exercise at reduced intensity even when not feeling well. Assisted patient to [facility] at end of session. Patient will continue to benefit from skilled physical therapy in order to improve function and reduce impairment.   OBJECTIVE IMPAIRMENTS Abnormal gait, decreased activity tolerance, decreased balance, decreased mobility, difficulty walking, decreased strength, impaired tone, and pain.   ACTIVITY LIMITATIONS carrying, lifting, bending, standing, squatting, stairs, transfers, bed mobility, dressing, and locomotion level  PARTICIPATION LIMITATIONS: meal prep, cleaning, laundry, driving, shopping, community activity, and yard work  PERSONAL FACTORS Fibromyalgia; headaches, LBBB; osteopenia  are also affecting patient's functional outcome.   REHAB POTENTIAL: Good  CLINICAL DECISION MAKING: Evolving/moderate complexity increasing pain in her quads   EVALUATION COMPLEXITY: Moderate   GOALS: Goals reviewed with patient? Yes  SHORT TERM GOALS: Target date: 04/18/2023  Patient will transfer sit to stand smoothly and independently  Baseline: Goal status: depends on the day 9/12  2.  Patient will increase gross strength by 5lbs  Baseline:  Goal status: MET  3.  Patient will improve BERG score by 10  Baseline:  Goal status: MET LONG TERM GOALS: Target date:POC  Patient will go improve balance to CGA for all balance exercises to improve safety  Baseline:  Goal status: INITIAL  2.  Patient will report a 50% reduction in falls  Baseline:  Goal status: INITIAL  3.  Patient will have a full balance program   Baseline:  Goal status: INITIAL 4. Patient will score at least 38/56 on Berg balance scale in order to demonstrate improved balance to reduce the risk for falls at home and in the community.  Baseline:  Goal status: INITIAL   PLAN: PT FREQUENCY: 2x/week  PT DURATION: 8 weeks  PLANNED INTERVENTIONS: Therapeutic exercises, Therapeutic activity, Neuromuscular re-education, Balance training, Gait training, Patient/Family education, Self Care, Joint mobilization, Stair training, Aquatic Therapy, Cryotherapy, Moist heat, Taping, Manual therapy, and Re-evaluation  PLAN FOR NEXT SESSION: work on improving sit to stand, posterior, dynamic, and static balance. Work on general conditioning. Be aware of leg pain on any given day.    Wyman Songster, PT DPT 11/25/2023, 11:07 AM

## 2023-11-29 ENCOUNTER — Ambulatory Visit (HOSPITAL_BASED_OUTPATIENT_CLINIC_OR_DEPARTMENT_OTHER): Attending: Family Medicine | Admitting: Physical Therapy

## 2023-11-29 ENCOUNTER — Encounter (HOSPITAL_BASED_OUTPATIENT_CLINIC_OR_DEPARTMENT_OTHER): Payer: Self-pay | Admitting: Physical Therapy

## 2023-11-29 DIAGNOSIS — G20A1 Parkinson's disease without dyskinesia, without mention of fluctuations: Secondary | ICD-10-CM | POA: Diagnosis not present

## 2023-11-29 DIAGNOSIS — R2689 Other abnormalities of gait and mobility: Secondary | ICD-10-CM | POA: Insufficient documentation

## 2023-11-29 NOTE — Therapy (Signed)
 OUTPATIENT PHYSICAL THERAPY LOWER EXTREMITY TREATMENT   Patient Name: Morgan [name] MRN: [id number] DOB:Jan 18, 1951, 73 y.o., female Today's Date: 11/29/2023      PT End of Session - 11/29/23 0935     Visit Number 46    Number of Visits 72    Date for PT Re-Evaluation 12/13/23    Progress Note Due on Visit 54    PT Start Time 0932    PT Stop Time 1012    PT Time Calculation (min) 40 min    Equipment Utilized During Treatment Gait belt    Activity Tolerance Patient tolerated treatment well    Behavior During Therapy WFL for tasks assessed/performed                    Past Medical History:  Diagnosis Date   Anxiety    Fibromyalgia    Headache    Hypercholesteremia    controlled on medication   Hyperlipidemia    Hypertension    no current meds   LBBB (left bundle branch block)    Melanoma (HCC)    MVP (mitral valve prolapse)    Osteopenia    Parkinson disease (HCC)    Pneumonia    SVD (spontaneous vaginal delivery)    x 2   Past Surgical History:  Procedure Laterality Date   BLEPHAROPLASTY     cold knife conization     COLONOSCOPY     FH colon CA   CYSTOSCOPY W/ URETERAL STENT PLACEMENT Left 10/18/2021   Procedure: CYSTOSCOPY WITH RETROGRADE PYELOGRAM, URETERAL STENT PLACEMENT;  Surgeon: Jerilee Field, MD;  Location: [facility];  Service: Urology;  Laterality: Left;   DILATATION & CURETTAGE/HYSTEROSCOPY WITH MYOSURE N/A 02/14/2018   Procedure: ATTEMPTED DILATATION & CURETTAGE/HYSTEROSCOPY WITH MYOSURE;  Surgeon: Gerald Leitz, MD;  Location: [facility];  Service: Gynecology;  Laterality: N/A;  polyp   melanoma cancer     removed from left leg   RECONSTRUCTION OF EYELID     TONSILLECTOMY     TUBAL LIGATION     URETEROSCOPY WITH HOLMIUM LASER LITHOTRIPSY Left 12/17/2021   Procedure: URETEROSCOPY/HOLMIUM LASER/STENT EXCHANGE;  Surgeon: Jerilee Field, MD;  Location: [facility];  Service: Urology;  Laterality: Left;   WISDOM TOOTH EXTRACTION     Patient  Active Problem List   Diagnosis Date Noted   COVID-19 virus infection 05/16/2022   HTN (hypertension) 10/20/2021   Physical deconditioning 10/20/2021   Hydronephrosis with infection 10/18/2021   Bacteremia due to Escherichia coli 10/18/2021   Severe sepsis (HCC) 10/17/2021   UTI (urinary tract infection) 10/17/2021   Acute metabolic encephalopathy 10/17/2021   Parkinson disease (HCC) 10/17/2021   Cervical stenosis (uterine cervix) 02/14/2018   Postmenopausal bleeding 02/14/2018   LBBB (left bundle branch block) 12/25/2015   PVCs (premature ventricular contractions) 12/25/2015   Hyperlipidemia 12/25/2015   Fibromyalgia 01/16/2014   Cholelithiasis 04/25/2013   Paralysis agitans (HCC) 01/15/2013   Hereditary and idiopathic peripheral neuropathy 01/15/2013     [name] MD PCP:   Cindi Carbon PROVIDER: Antoine Primas MD   REFERRING DIAG: Parkinsons   THERAPY DIAG:  Other abnormalities of gait and mobility  Parkinson's disease without dyskinesia or fluctuating manifestations (HCC)  Rationale for Evaluation and Treatment Rehabilitation  ONSET DATE: Several years   SUBJECTIVE:   SUBJECTIVE STATEMENT: Patient states feeling better today.    PERTINENT HISTORY: Fibromyalgia; headaches, LBBB; osteopenia   PAIN:  Are you having pain? no: NPRS scale: 0/10 Location: Pain description:    Aggravating factors:  just comes and goes  Relieving factors: just comes and goes; sometimes ibuprofen works but she is limited as to how much she can take  PRECAUTIONS: Patient reports that because of her heart she will pass out at times.   WEIGHT BEARING RESTRICTIONS No  FALLS:  Has patient fallen in last 6 months? Yes.  6 falls but likley more. Last fall    LIVING ENVIRONMENT: Stairs to the second floor in her house but she dosen't do much  4 steps in the front  2 steps in the garage  OCCUPATION: retired   Presenter, broadcasting: reading; goes to an exercise class   PLOF: Independent with  household mobility with device  PATIENT GOALS  To have less pain in her legs and to move better    OBJECTIVE:  (measurements taken at evaluation unless otherwise noted)   DIAGNOSTIC FINDINGS:   PATIENT SURVEYS:  FOTO    COGNITION:  Overall cognitive status: Within functional limits for tasks assessed     SENSATION: Denies paresthesias   POSTURE: No Significant postural limitations  PALPATION: No trigger points noted   LOWER EXTREMITY ROM:  Passive ROM Right eval Left eval  Hip flexion    Hip extension    Hip abduction    Hip adduction    Hip internal rotation    Hip external rotation    Knee flexion    Knee extension    Ankle dorsiflexion    Ankle plantarflexion    Ankle inversion    Ankle eversion     (Blank rows = not tested)  WNL  LOWER EXTREMITY MMT:  MMT Right eval Left eval Right  7/22 Left  7/2 Right  9/12 Left 9/12 Right 06/13/23 Left 06/13/23 Right 08/02/23 Left 08/02/23 Right 10/18/23 Left 10/18/23 Right  Left   Hip flexion 21.7 29.7 23.5 25.9 24.6 25.6 41.3 37.8 47 45.5 44.9 46    Hip extension                Hip abduction 27.6 18.5 19.1 8.7 21.2 14.4 29.9 29.5 35.5 35.3 25.7 35.5    Hip adduction                Hip internal rotation                Hip external rotation                Knee flexion                Knee extension 23.5 20.2 17.7 16.8 22.7 16.3 39.4 49.8 48.8 53.5 48 42.3    Ankle dorsiflexion                Ankle plantarflexion                Ankle inversion                Ankle eversion                 (Blank rows = not tested)  GAIT: Decreased hip flexion; leans to the left; right hip frop. Patient required CGA while walking and min a when she turned for balance. No change from IE   Functional tests:  Sit to stand: cuing to catch balance. Uses momentum to stand.    5x sit to stand Balance: 13 sec   9/12 5x sit to stand 13 sec Narrow base min a  Narrow base eyes closed min to mod a  Tandem  stance Min a  both ways.  Single leg stance 5 sec bilateral     06/13/23: 285 feet with walking stick, CGA  for safety  Reassessment 08/02/23:  258 feet with walking stick, CGA  for safety 5xSTS: 13.5 seconds with poor eccentric control  Reassessment 10/18/23:  - deferred today due to PT injury 5xSTS: 13.36 seconds with poor eccentric control, posterior lean with transfer/standing  Reassessment 10/18/23:  - deferred today due to PT injury 5xSTS: 10.97 seconds with 3/4 STS each rep, - took 4 attempts for patient to complete that due to impaired initation poor eccentric control, posterior lean with transfer/standing  BERG Balance Test          Date:      Sit to Stand 1 2 06/13/23: 2 08/02/23: 3 10/18/23: 3 11/22/23: 3  Standing unsupported 3 3 3 3 3 3   Sitting with back unsupported but feet supported 1 3 4 4 4 4   Stand to sit  1 2 3 3 3 1   Transfers  2 1 2 2 3 2   Standing unsupported with eyes closed 3 3 3 3 3 3   Standing unsupported feet together 3 3 3 3 3 3   From standing position, reach forward with outstretched arm 1 2 3 3 3 3   From standing position, pick up object from floor 1 2 3 3 3 3   From standing position, turn and look behind over each shoulder 1 2 2 3 3 3   Turn 360 2 1 2 2 2 2   Standing unsupported, alternately place foot on step 1 1 2 2 2 2   Standing unsupported, one foot in front 1 1 2 2 2 2   Standing on one leg 0 0 2 1 2 1   Total:  21/56 26/56 34/56  33/56 36/56 35/56       TODAY'S TREATMENT: 11/29/23 Nustep level 5 seat 8,  10 minutes with subjective and POC discussion with patient/spouse Seated bicep curl and shoulder press 4# 2 x 10 Seated punches 4# 3 x 10 Step taps 6 inch 2 x 30 STS with overhead reach 2x10 Seated tricep dips 2 x 10    11/25/23 Nustep level 5, seat 8, 5 minutes     11/22/23 Nustep level 5, seat 8, 10 minutes with subjective and POC discussion with patient/spouse Reassessment   11/18/23 Nustep level 5, seat 8, 5 minutes STS with  overhead reach 3x10  Seated bicep curl and shoulder press 4# 2 x 10 Anterior stepping with overhead reach 2 x 10 Lateral stepping with overhead reach 2 x 10 Shoulder flexion 1# 2 x 10  Seated shoulder abduction 1# 2 x 10 LAQ 3# 2x 10  11/15/23 Nustep level 5, seat 8, 6 minutes Bicep curl and shoulder press 3# 2 x 10 Shoulder flexion 1# 2 x 10  Seated ab iso with ball 2x 10 x 5 second holds Seated row GTB 2 x 15 Seated shoulder extension GTB 2 x 15   11/11/23 Nustep 5 minutes for dynamic warm up and conditioning level 5, seat 7 Seated shoulder press 4# 3 x 10  Seated punches 4# 3 x 10 Gait with RW 1 x 40 feet, 1 x 100 feet; gait with walking stick 1 x 100 Seated ab iso with ball 10 x 5 second holds Anterior stepping with overhead reach 2 x 12 Lateral stepping with overhead reach 2 x 10  11/08/23 Seated shoulder press 3# 2 x 10  Seated LAQ 2 x 10 with 5  second holds Seated hip adduction isometric 10 x 10 second holds Seated hip abduction isometric 10 x 10 second holds Seated bicep curls 3# bar 2 x 20   11/04/23 Nustep 5 minutes for dynamic warm up and conditioning level 5, seat 7 Hamstring curl machine 30# 2 x 10  Leg press machine 10# 2 x 10  Knee extension 10# 2 x 10 Hip adduction 40# 3 x 15 Hip abduction 40# 3 x 15  11/01/23  Nustep 5 minutes for dynamic warm up and conditioning level 5, seat 7   10/28/23  Nustep 5 minutes for dynamic warm up and conditioning level 5, seat 7 Hamstring curl machine 30# 2 x 10  Leg press machine 10# 2 x 10  Knee extension 10# 2 x 10 Calf machine 10# 2 x 10 Hip adduction 40# 2 x 15 Hip abduction 40# 2 x 15 Row GTB 3x15 High row GTB 2 x 15  10/25/23 STS with overhead reach 3 x 10 Nustep 5 minutes for dynamic warm up and conditioning level 5, seat 7 Lateral step up 4 inch 2 x 20 Seated tricep dips 3 x 10   10/21/23 Nustep 6 minutes for dynamic warm up and conditioning level 5, seat 7 STS with overhead reach 3 x 10 Standing step taps  4 inch step 2 x 20 bilateral Seated shoulder press 4# 3 x 10  Seated tricep dips 3 x 10  LAQ 3# 1 x 10 with 5 second holds Anterior stepping with overhead reach 2 x 15  10/18/23 Reassessment Seated punches 4# 3 x 10 Seated shoulder press 4# 3 x 10  Seated bicep curls 4# 3 x 10  09/06/23 Nustep 6 minutes for dynamic warm up and conditioning level 5, seat 7 Active hamstring stretch 10 x 10 second holds Supine hamstring isometrics 10 x 5 second holds LAQ 3# 1 x 10 second holds Seated shoulder press 4# 3 x 10 Seated tricep dips 3 x 10     PATIENT EDUCATION:  Education details: exercise rationale and technique   Person educated: Patient Education method: Explanation, Demonstration, Tactile cues, Verbal cues, Education comprehension: verbalized understanding, returned demonstration, verbal cues required, tactile cues required, and needs further education   HOME EXERCISE PROGRAM: Access Code: [email or web address]   URL: [email or web address] Date: 06/28/2023 - Seated March with Resistance  - 1 x daily - 7 x weekly - 3 sets - 15 reps - Seated Hip Abduction with Resistance  - 1 x daily - 7 x weekly - 3 sets - 15 reps - Seated Long Arc Quad  - 1 x daily - 7 x weekly - 3 sets - 15 reps - Shoulder External Rotation and Scapular Retraction with Resistance  - 1 x daily - 7 x weekly - 2 sets - 10 reps - Seated Shoulder Horizontal Abduction with Resistance  - 1 x daily - 7 x weekly - 2 sets - 10 reps  08/11/23 - Seated Punches  - 1 x daily - 7 x weekly - 3 sets - 10 reps - Seated Shoulder Overhead Press with Dumbbells with PLB  - 1 x daily - 7 x weekly - 3 sets - 10 reps - Seated Bicep Curls Supinated with Dumbbells  - 1 x daily - 7 x weekly - 3 sets - 10 reps - Seated Shoulder Abduction with Dumbbells - Thumbs Up  - 1 x daily - 7 x weekly - 3 sets - 10 reps  ASSESSMENT:  CLINICAL IMPRESSION: Patient performing better today compared  to last session. Tolerates increased time on nustep  with intermittent cueing for larger step length. Continued cueing and CGA with standing/ambulating in clinic. Patient performing well overall during session but began having increased deficit for last 10-15 minutes of session. Patient will continue to benefit from skilled physical therapy in order to improve function and reduce impairment.   OBJECTIVE IMPAIRMENTS Abnormal gait, decreased activity tolerance, decreased balance, decreased mobility, difficulty walking, decreased strength, impaired tone, and pain.   ACTIVITY LIMITATIONS carrying, lifting, bending, standing, squatting, stairs, transfers, bed mobility, dressing, and locomotion level  PARTICIPATION LIMITATIONS: meal prep, cleaning, laundry, driving, shopping, community activity, and yard work  PERSONAL FACTORS Fibromyalgia; headaches, LBBB; osteopenia  are also affecting patient's functional outcome.   REHAB POTENTIAL: Good  CLINICAL DECISION MAKING: Evolving/moderate complexity increasing pain in her quads   EVALUATION COMPLEXITY: Moderate   GOALS: Goals reviewed with patient? Yes  SHORT TERM GOALS: Target date: 04/18/2023  Patient will transfer sit to stand smoothly and independently  Baseline: Goal status: depends on the day 9/12  2.  Patient will increase gross strength by 5lbs  Baseline:  Goal status: MET  3.  Patient will improve BERG score by 10  Baseline:  Goal status: MET LONG TERM GOALS: Target date:POC  Patient will go improve balance to CGA for all balance exercises to improve safety  Baseline:  Goal status: INITIAL  2.  Patient will report a 50% reduction in falls  Baseline:  Goal status: INITIAL  3.  Patient will have a full balance program  Baseline:  Goal status: INITIAL 4. Patient will score at least 38/56 on Berg balance scale in order to demonstrate improved balance to reduce the risk for falls at home and in the community.  Baseline:  Goal status: INITIAL   PLAN: PT FREQUENCY:  2x/week  PT DURATION: 8 weeks  PLANNED INTERVENTIONS: Therapeutic exercises, Therapeutic activity, Neuromuscular re-education, Balance training, Gait training, Patient/Family education, Self Care, Joint mobilization, Stair training, Aquatic Therapy, Cryotherapy, Moist heat, Taping, Manual therapy, and Re-evaluation  PLAN FOR NEXT SESSION: work on improving sit to stand, posterior, dynamic, and static balance. Work on general conditioning. Be aware of leg pain on any given day.    Wyman Songster, PT DPT 11/29/2023, 9:36 AM

## 2023-12-02 ENCOUNTER — Encounter (HOSPITAL_BASED_OUTPATIENT_CLINIC_OR_DEPARTMENT_OTHER): Payer: Self-pay | Admitting: Physical Therapy

## 2023-12-02 ENCOUNTER — Ambulatory Visit (HOSPITAL_BASED_OUTPATIENT_CLINIC_OR_DEPARTMENT_OTHER): Admitting: Physical Therapy

## 2023-12-02 DIAGNOSIS — G20A1 Parkinson's disease without dyskinesia, without mention of fluctuations: Secondary | ICD-10-CM | POA: Diagnosis not present

## 2023-12-02 DIAGNOSIS — R2689 Other abnormalities of gait and mobility: Secondary | ICD-10-CM

## 2023-12-02 NOTE — Therapy (Signed)
 OUTPATIENT PHYSICAL THERAPY LOWER EXTREMITY TREATMENT   Patient Name: Morgan [name] MRN: [id number] DOB:Jul 15, 1951, 73 y.o., female Today's Date: 12/04/2023      PT End of Session - 12/04/23 0805     Visit Number 47    Number of Visits 72    Date for PT Re-Evaluation 12/13/23    Authorization Type Progress note done at visit 26 next at 36    PT Start Time 1045    PT Stop Time 1128    PT Time Calculation (min) 43 min    Activity Tolerance Patient tolerated treatment well    Behavior During Therapy WFL for tasks assessed/performed                    Past Medical History:  Diagnosis Date   Anxiety    Fibromyalgia    Headache    Hypercholesteremia    controlled on medication   Hyperlipidemia    Hypertension    no current meds   LBBB (left bundle branch block)    Melanoma (HCC)    MVP (mitral valve prolapse)    Osteopenia    Parkinson disease (HCC)    Pneumonia    SVD (spontaneous vaginal delivery)    x 2   Past Surgical History:  Procedure Laterality Date   BLEPHAROPLASTY     cold knife conization     COLONOSCOPY     FH colon CA   CYSTOSCOPY W/ URETERAL STENT PLACEMENT Left 10/18/2021   Procedure: CYSTOSCOPY WITH RETROGRADE PYELOGRAM, URETERAL STENT PLACEMENT;  Surgeon: Jerilee Field, MD;  Location: [facility];  Service: Urology;  Laterality: Left;   DILATATION & CURETTAGE/HYSTEROSCOPY WITH MYOSURE N/A 02/14/2018   Procedure: ATTEMPTED DILATATION & CURETTAGE/HYSTEROSCOPY WITH MYOSURE;  Surgeon: Gerald Leitz, MD;  Location: [facility];  Service: Gynecology;  Laterality: N/A;  polyp   melanoma cancer     removed from left leg   RECONSTRUCTION OF EYELID     TONSILLECTOMY     TUBAL LIGATION     URETEROSCOPY WITH HOLMIUM LASER LITHOTRIPSY Left 12/17/2021   Procedure: URETEROSCOPY/HOLMIUM LASER/STENT EXCHANGE;  Surgeon: Jerilee Field, MD;  Location: [facility];  Service: Urology;  Laterality: Left;   WISDOM TOOTH EXTRACTION     Patient Active Problem List    Diagnosis Date Noted   COVID-19 virus infection 05/16/2022   HTN (hypertension) 10/20/2021   Physical deconditioning 10/20/2021   Hydronephrosis with infection 10/18/2021   Bacteremia due to Escherichia coli 10/18/2021   Severe sepsis (HCC) 10/17/2021   UTI (urinary tract infection) 10/17/2021   Acute metabolic encephalopathy 10/17/2021   Parkinson disease (HCC) 10/17/2021   Cervical stenosis (uterine cervix) 02/14/2018   Postmenopausal bleeding 02/14/2018   LBBB (left bundle branch block) 12/25/2015   PVCs (premature ventricular contractions) 12/25/2015   Hyperlipidemia 12/25/2015   Fibromyalgia 01/16/2014   Cholelithiasis 04/25/2013   Paralysis agitans (HCC) 01/15/2013   Hereditary and idiopathic peripheral neuropathy 01/15/2013     [name] MD PCP:   Cindi Carbon PROVIDER: Antoine Primas MD   REFERRING DIAG: Parkinsons   THERAPY DIAG:  Other abnormalities of gait and mobility  Parkinson's disease without dyskinesia or fluctuating manifestations (HCC)  Rationale for Evaluation and Treatment Rehabilitation  ONSET DATE: Several years   SUBJECTIVE:   SUBJECTIVE STATEMENT: Patient comes in today    PERTINENT HISTORY: Fibromyalgia; headaches, LBBB; osteopenia   PAIN:  Are you having pain? no: NPRS scale: 0/10 Location: Pain description:    Aggravating factors: just comes and goes  Relieving factors: just comes and goes; sometimes ibuprofen works but she is limited as to how much she can take  PRECAUTIONS: Patient reports that because of her heart she will pass out at times.   WEIGHT BEARING RESTRICTIONS No  FALLS:  Has patient fallen in last 6 months? Yes.  6 falls but likley more. Last fall    LIVING ENVIRONMENT: Stairs to the second floor in her house but she dosen't do much  4 steps in the front  2 steps in the garage  OCCUPATION: retired   Presenter, broadcasting: reading; goes to an exercise class   PLOF: Independent with household mobility with  device  PATIENT GOALS  To have less pain in her legs and to move better    OBJECTIVE:  (measurements taken at evaluation unless otherwise noted)   DIAGNOSTIC FINDINGS:   PATIENT SURVEYS:  FOTO    COGNITION:  Overall cognitive status: Within functional limits for tasks assessed     SENSATION: Denies paresthesias   POSTURE: No Significant postural limitations  PALPATION: No trigger points noted   LOWER EXTREMITY ROM:  Passive ROM Right eval Left eval  Hip flexion    Hip extension    Hip abduction    Hip adduction    Hip internal rotation    Hip external rotation    Knee flexion    Knee extension    Ankle dorsiflexion    Ankle plantarflexion    Ankle inversion    Ankle eversion     (Blank rows = not tested)  WNL  LOWER EXTREMITY MMT:  MMT Right eval Left eval Right  7/22 Left  7/2 Right  9/12 Left 9/12 Right 06/13/23 Left 06/13/23 Right 08/02/23 Left 08/02/23 Right 10/18/23 Left 10/18/23 Right  Left   Hip flexion 21.7 29.7 23.5 25.9 24.6 25.6 41.3 37.8 47 45.5 44.9 46    Hip extension                Hip abduction 27.6 18.5 19.1 8.7 21.2 14.4 29.9 29.5 35.5 35.3 25.7 35.5    Hip adduction                Hip internal rotation                Hip external rotation                Knee flexion                Knee extension 23.5 20.2 17.7 16.8 22.7 16.3 39.4 49.8 48.8 53.5 48 42.3    Ankle dorsiflexion                Ankle plantarflexion                Ankle inversion                Ankle eversion                 (Blank rows = not tested)  GAIT: Decreased hip flexion; leans to the left; right hip frop. Patient required CGA while walking and min a when she turned for balance. No change from IE   Functional tests:  Sit to stand: cuing to catch balance. Uses momentum to stand.    5x sit to stand Balance: 13 sec   9/12 5x sit to stand 13 sec Narrow base min a  Narrow base eyes closed min to mod a  Tandem stance Min a both ways.  Single leg  stance 5 sec bilateral     06/13/23: 285 feet with walking stick, CGA  for safety  Reassessment 08/02/23:  258 feet with walking stick, CGA  for safety 5xSTS: 13.5 seconds with poor eccentric control  Reassessment 10/18/23:  - deferred today due to PT injury 5xSTS: 13.36 seconds with poor eccentric control, posterior lean with transfer/standing  Reassessment 10/18/23:  - deferred today due to PT injury 5xSTS: 10.97 seconds with 3/4 STS each rep, - took 4 attempts for patient to complete that due to impaired initation poor eccentric control, posterior lean with transfer/standing  BERG Balance Test          Date:      Sit to Stand 1 2 06/13/23: 2 08/02/23: 3 10/18/23: 3 11/22/23: 3  Standing unsupported 3 3 3 3 3 3   Sitting with back unsupported but feet supported 1 3 4 4 4 4   Stand to sit  1 2 3 3 3 1   Transfers  2 1 2 2 3 2   Standing unsupported with eyes closed 3 3 3 3 3 3   Standing unsupported feet together 3 3 3 3 3 3   From standing position, reach forward with outstretched arm 1 2 3 3 3 3   From standing position, pick up object from floor 1 2 3 3 3 3   From standing position, turn and look behind over each shoulder 1 2 2 3 3 3   Turn 360 2 1 2 2 2 2   Standing unsupported, alternately place foot on step 1 1 2 2 2 2   Standing unsupported, one foot in front 1 1 2 2 2 2   Standing on one leg 0 0 2 1 2 1   Total:  21/56 26/56 34/56  33/56 36/56 35/56       TODAY'S TREATMENT:  There-ex:   Nu-step 5 min L3   There-act:  Sit to stand 3x3 with CGA for balance  Hurdle step over 2x10 each  fwd and lateral   Neuro -re -ed  Step onto air-ex 2x10  Ai-ex rock 2x10   There-ex  Bicpes curls with posture 2x10 4 lbs  Punches with posture 2x10 4 lbs  Press up 2x10 4 lbs         11/29/23 Nustep level 5 seat 8,  10 minutes with subjective and POC discussion with patient/spouse Seated bicep curl and shoulder press 4# 2 x 10 Seated punches 4# 3 x 10 Step taps 6 inch 2 x  30 STS with overhead reach 2x10 Seated tricep dips 2 x 10    11/25/23 Nustep level 5, seat 8, 5 minutes     11/22/23 Nustep level 5, seat 8, 10 minutes with subjective and POC discussion with patient/spouse Reassessment   11/18/23 Nustep level 5, seat 8, 5 minutes STS with overhead reach 3x10  Seated bicep curl and shoulder press 4# 2 x 10 Anterior stepping with overhead reach 2 x 10 Lateral stepping with overhead reach 2 x 10 Shoulder flexion 1# 2 x 10  Seated shoulder abduction 1# 2 x 10 LAQ 3# 2x 10  11/15/23 Nustep level 5, seat 8, 6 minutes Bicep curl and shoulder press 3# 2 x 10 Shoulder flexion 1# 2 x 10  Seated ab iso with ball 2x 10 x 5 second holds Seated row GTB 2 x 15 Seated shoulder extension GTB 2 x 15   11/11/23 Nustep 5 minutes for dynamic warm up and conditioning level 5, seat 7 Seated shoulder press 4# 3  x 10  Seated punches 4# 3 x 10 Gait with RW 1 x 40 feet, 1 x 100 feet; gait with walking stick 1 x 100 Seated ab iso with ball 10 x 5 second holds Anterior stepping with overhead reach 2 x 12 Lateral stepping with overhead reach 2 x 10  11/08/23 Seated shoulder press 3# 2 x 10  Seated LAQ 2 x 10 with 5 second holds Seated hip adduction isometric 10 x 10 second holds Seated hip abduction isometric 10 x 10 second holds Seated bicep curls 3# bar 2 x 20   11/04/23 Nustep 5 minutes for dynamic warm up and conditioning level 5, seat 7 Hamstring curl machine 30# 2 x 10  Leg press machine 10# 2 x 10  Knee extension 10# 2 x 10 Hip adduction 40# 3 x 15 Hip abduction 40# 3 x 15  11/01/23  Nustep 5 minutes for dynamic warm up and conditioning level 5, seat 7      PATIENT EDUCATION:  Education details: exercise rationale and technique   Person educated: Patient Education method: Explanation, Demonstration, Tactile cues, Verbal cues, Education comprehension: verbalized understanding, returned demonstration, verbal cues required, tactile cues required,  and needs further education   HOME EXERCISE PROGRAM: Access Code: [email or web address]   URL: [email or web address] Date: 06/28/2023 - Seated March with Resistance  - 1 x daily - 7 x weekly - 3 sets - 15 reps - Seated Hip Abduction with Resistance  - 1 x daily - 7 x weekly - 3 sets - 15 reps - Seated Long Arc Quad  - 1 x daily - 7 x weekly - 3 sets - 15 reps - Shoulder External Rotation and Scapular Retraction with Resistance  - 1 x daily - 7 x weekly - 2 sets - 10 reps - Seated Shoulder Horizontal Abduction with Resistance  - 1 x daily - 7 x weekly - 2 sets - 10 reps  08/11/23 - Seated Punches  - 1 x daily - 7 x weekly - 3 sets - 10 reps - Seated Shoulder Overhead Press with Dumbbells with PLB  - 1 x daily - 7 x weekly - 3 sets - 10 reps - Seated Bicep Curls Supinated with Dumbbells  - 1 x daily - 7 x weekly - 3 sets - 10 reps - Seated Shoulder Abduction with Dumbbells - Thumbs Up  - 1 x daily - 7 x weekly - 3 sets - 10 reps  ASSESSMENT:  CLINICAL IMPRESSION: The patient required more assist as she became more fatigued. She required cuing for step height and step length especially as she became more fatigued. She required increased guarding leaving the clinic. Therapy will continue to progress as tolerated.  OBJECTIVE IMPAIRMENTS Abnormal gait, decreased activity tolerance, decreased balance, decreased mobility, difficulty walking, decreased strength, impaired tone, and pain.   ACTIVITY LIMITATIONS carrying, lifting, bending, standing, squatting, stairs, transfers, bed mobility, dressing, and locomotion level  PARTICIPATION LIMITATIONS: meal prep, cleaning, laundry, driving, shopping, community activity, and yard work  PERSONAL FACTORS Fibromyalgia; headaches, LBBB; osteopenia  are also affecting patient's functional outcome.   REHAB POTENTIAL: Good  CLINICAL DECISION MAKING: Evolving/moderate complexity increasing pain in her quads   EVALUATION COMPLEXITY:  Moderate   GOALS: Goals reviewed with patient? Yes  SHORT TERM GOALS: Target date: 04/18/2023  Patient will transfer sit to stand smoothly and independently  Baseline: Goal status: depends on the day 9/12  2.  Patient will increase gross strength by 5lbs  Baseline:  Goal status: MET  3.  Patient will improve BERG score by 10  Baseline:  Goal status: MET LONG TERM GOALS: Target date:POC  Patient will go improve balance to CGA for all balance exercises to improve safety  Baseline:  Goal status: INITIAL  2.  Patient will report a 50% reduction in falls  Baseline:  Goal status: INITIAL  3.  Patient will have a full balance program  Baseline:  Goal status: INITIAL 4. Patient will score at least 38/56 on Berg balance scale in order to demonstrate improved balance to reduce the risk for falls at home and in the community.  Baseline:  Goal status: INITIAL   PLAN: PT FREQUENCY: 2x/week  PT DURATION: 8 weeks  PLANNED INTERVENTIONS: Therapeutic exercises, Therapeutic activity, Neuromuscular re-education, Balance training, Gait training, Patient/Family education, Self Care, Joint mobilization, Stair training, Aquatic Therapy, Cryotherapy, Moist heat, Taping, Manual therapy, and Re-evaluation  PLAN FOR NEXT SESSION: work on improving sit to stand, posterior, dynamic, and static balance. Work on general conditioning. Be aware of leg pain on any given day.    Dessie Coma, PT DPT 12/04/2023, 8:12 AM

## 2023-12-04 ENCOUNTER — Encounter (HOSPITAL_BASED_OUTPATIENT_CLINIC_OR_DEPARTMENT_OTHER): Payer: Self-pay | Admitting: Physical Therapy

## 2023-12-04 NOTE — Progress Notes (Unsigned)
 [facility] [address] [place] [address] Phone: [phone] Subjective:   [name] am a scribe for Dr. Katrinka Blazing.    I'm seeing this patient by the request  of:  [name], MD  CC: Pain with Parkinson's disease follow-up  BJY:[id number]  09/05/2023 Patient has done remarkably well.  Did discuss that I do think balance and coordination will continue to improve with potentially some strength training.  Patient has made a very good improvement at the moment.      Update 12/05/2023 Morgan [name] is a 73 y.o. female coming in with complaint of Parkinson's disease.  Patient also has a past medical history significant for peripheral neuropathy, fibromyalgia as well and physical deconditioning.  Has been going to formal physical therapy regularly.  Patient states doing so-so. First thing in the morning she does well. Once she takes her medication she isn't walking that well.    Since we have seen patient was seen in the emergency department for a urinary tract infection.  Past Medical History:  Diagnosis Date   Anxiety    Fibromyalgia    Headache    Hypercholesteremia    controlled on medication   Hyperlipidemia    Hypertension    no current meds   LBBB (left bundle branch block)    Melanoma (HCC)    MVP (mitral valve prolapse)    Osteopenia    Parkinson disease (HCC)    Pneumonia    SVD (spontaneous vaginal delivery)    x 2   Past Surgical History:  Procedure Laterality Date   BLEPHAROPLASTY     cold knife conization     COLONOSCOPY     FH colon CA   CYSTOSCOPY W/ URETERAL STENT PLACEMENT Left 10/18/2021   Procedure: CYSTOSCOPY WITH RETROGRADE PYELOGRAM, URETERAL STENT PLACEMENT;  Surgeon: Jerilee Field, MD;  Location: [facility];  Service: Urology;  Laterality: Left;   DILATATION & CURETTAGE/HYSTEROSCOPY WITH MYOSURE N/A 02/14/2018   Procedure: ATTEMPTED DILATATION & CURETTAGE/HYSTEROSCOPY WITH MYOSURE;  Surgeon: Gerald Leitz, MD;  Location: [facility];  Service: Gynecology;  Laterality: N/A;  polyp   melanoma cancer     removed from left leg   RECONSTRUCTION OF EYELID     TONSILLECTOMY     TUBAL LIGATION     URETEROSCOPY WITH HOLMIUM LASER LITHOTRIPSY Left 12/17/2021   Procedure: URETEROSCOPY/HOLMIUM LASER/STENT EXCHANGE;  Surgeon: Jerilee Field, MD;  Location: [facility];  Service: Urology;  Laterality: Left;   WISDOM TOOTH EXTRACTION     Social History   Socioeconomic History   Marital status: Married    Spouse name: Not on file   Number of children: 2   Years of education: Not on file   Highest education level: Bachelor's degree (e.g., BA, AB, BS)  Occupational History   Occupation: retired    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  Tobacco Use   Smoking status: Former    Current packs/day: 0.00    Types: Cigarettes    Quit date: 09/29/1997    Years since quitting: 26.2   Smokeless tobacco: Never   Tobacco comments:    chews [place], 10-12/day  Vaping Use   Vaping status: Never Used  Substance and Sexual Activity   Alcohol use: Yes    Comment: Occasional wine   Drug use: No   Sexual activity: Not on file  Other Topics Concern   Not on file  Social History Narrative   right handed   2 story home  Lives with spouse   Social Drivers of Health   Financial Resource Strain: Low Risk  (09/24/2023)   Received [facility] Short Social Needs Screening - Medical Financial Resource Strain    Would you like help with any of the following needs? (Select ALL that apply): I don't want help with any of these  Food Insecurity: No Food Insecurity (09/24/2023)   Received from [facility] Short Social Needs Screening - Food Insecurity    Would you like help with any of the following needs? (Select ALL that apply): I don't want help with any of these  Transportation Needs: No Transportation Needs (09/24/2023)   Received [facility] Short Social Needs Screening -  Transportation    Would you like help with any of the following needs? (Select ALL that apply): I don't want help with any of these  Physical Activity: Not on file  Stress: Not on file  Social Connections: Socially Integrated (09/24/2023)   Received from [facility] Short Social Needs Screening - Social Connection    Would you like help with any of the following needs? (Select ALL that apply): I don't want help with any of these   Allergies  Allergen Reactions   Other Anaphylaxis    "Anti Seizure Medications"   Phenergan [Promethazine Hcl] Other (See Comments)    Makes Parkinson's worse   Acetaminophen Other (See Comments)    "I just get weird"   Itraconazole Hives   Tamsulosin Other (See Comments)    B/P DROPPED TOO MUCH!!   Family History  Problem Relation Age of Onset   Cancer Mother        colon   Healthy Brother    Healthy Sister    Healthy Child     Current Outpatient Medications (Endocrine & Metabolic):    raloxifene (EVISTA) 60 MG tablet, Take 60 mg by mouth in the morning.  Current Outpatient Medications (Cardiovascular):    atorvastatin (LIPITOR) 10 MG tablet, Take 10 mg by mouth in the morning.   midodrine (PROAMATINE) 2.5 MG tablet, Take 1 tablet (2.5 mg total) by mouth 3 (three) times daily as needed (for systolic blood pressure less than 100).   amLODipine (NORVASC) 2.5 MG tablet, Take 1 tablet (2.5 mg total) by mouth daily.   Current Outpatient Medications (Analgesics):    aspirin EC 81 MG tablet, Take 81 mg by mouth daily. Swallow whole.   Ibuprofen 200 MG CAPS, Take 2 capsules (400 mg total) by mouth every 8 (eight) hours as needed (Leg pain, headache or mild pain).   Current Outpatient Medications (Other):    ALPRAZolam (XANAX) 0.25 MG tablet, Take 0.25 mg by mouth daily as needed for anxiety.    BLINK TEARS 0.25 % SOLN, Place 1 drop into both eyes 3 (three) times daily as needed (for dryness).   carbidopa-levodopa (SINEMET IR) 25-100 MG tablet,  1 tablet every 2 hours starting at 6-7am (getting about 6-7 in per day)The medication have a score line so it can be broken.   Cholecalciferol (VITAMIN D3) 25 MCG (1000 UT) CHEW, Chew 2,500 Units by mouth daily.   Coenzyme Q10-Vitamin E (QUNOL ULTRA COQ10 PO), Take 1 capsule by mouth daily.   CYMBALTA 60 MG capsule, TAKE 1 CAPSULE EVERY       MORNING   gabapentin (NEURONTIN) 400 MG capsule, Take 1 capsule (400 mg total) by mouth 3 (three) times daily as needed (for leg  pain or soreness).   Levodopa (INBRIJA) 42 MG CAPS, You can inhale the capsules as needed up to 5 times per day, separated by 2 hour intervals.   methocarbamol (ROBAXIN) 750 MG tablet, Take 750 mg by mouth every 6 (six) hours as needed for muscle spasms (or leg pain).   Multiple Vitamins-Minerals (PRESERVISION AREDS 2 PO), Take 1 capsule by mouth 2 (two) times daily.   Pramipexole Dihydrochloride 0.75 MG TB24, TAKE 1 TABLET DAILY   ramelteon (ROZEREM) 8 MG tablet, TAKE 1 TABLET AT BEDTIME.   TART CHERRY PO, Take 3,000 mg by mouth daily.   Reviewed prior external information including notes and imaging from  primary care provider As well as notes that were available from care everywhere and other healthcare systems.  Past medical history, social, surgical and family history all reviewed in electronic medical record.  No pertanent information unless stated regarding to the chief complaint.   Review of Systems:  No headache, visual changes, nausea, vomiting, diarrhea, constipation, dizziness, abdominal pain, skin rash, fevers, chills, night sweats, weight loss, swollen lymph nodes, joint swelling, chest pain, shortness of breath, mood changes. POSITIVE muscle aches, body aches  Objective  Blood pressure 124/82, pulse 96, height 5\' 4"  (1.626 m), weight 163 lb (73.9 kg), SpO2 98%.   General: No apparent distress alert and oriented x3 mood and affect normal, dressed appropriately.  HEENT: Pupils equal, extraocular movements intact   Respiratory: Patient's speak in full sentences and does not appear short of breath  Cardiovascular: No lower extremity edema, non tender, no erythema  Patient back exam does have some loss of lordosis.  Some shuffling gait noted.  Still has some hypertonicity noted.  Some atrophy noted of the thighs.    Impression and Recommendations:    The above documentation has been reviewed and is accurate and complete Judi Saa, DO

## 2023-12-05 ENCOUNTER — Ambulatory Visit (INDEPENDENT_AMBULATORY_CARE_PROVIDER_SITE_OTHER): Payer: Medicare Other | Admitting: Family Medicine

## 2023-12-05 VITALS — BP 124/82 | HR 96 | Ht 64.0 in | Wt 163.0 lb

## 2023-12-05 DIAGNOSIS — R5381 Other malaise: Secondary | ICD-10-CM | POA: Diagnosis not present

## 2023-12-05 NOTE — Patient Instructions (Addendum)
 Good to see you. Return in 3 months. Continue PT as long as it feels good.

## 2023-12-05 NOTE — Assessment & Plan Note (Signed)
 Continues to have some difficulty with the legs.  More of a burning sensation than truly anything else.  Discussed icing regimen and home exercises, discussed which activities to do and which ones to avoid.  Increase activity slowly.  Follow-up again in 12 weeks.  Patient is doing physical therapy and I do think that it is extremely beneficial for her and her quality of life with the underlying Parkinson's disease.  Still working on her medications at this time.  Would make no other significant changes.  Since we have seen her has had some difficulty with recurrent urinary tract infections and is now on preventative antibiotics.  Does not appear ill at this time.  Patient is in good spirits and hopefully will continue to improve.  Total time with patient today 31 minutes

## 2023-12-06 ENCOUNTER — Ambulatory Visit (HOSPITAL_BASED_OUTPATIENT_CLINIC_OR_DEPARTMENT_OTHER): Admitting: Physical Therapy

## 2023-12-06 ENCOUNTER — Encounter (HOSPITAL_BASED_OUTPATIENT_CLINIC_OR_DEPARTMENT_OTHER): Payer: Self-pay | Admitting: Physical Therapy

## 2023-12-06 DIAGNOSIS — R2689 Other abnormalities of gait and mobility: Secondary | ICD-10-CM

## 2023-12-06 DIAGNOSIS — G20A1 Parkinson's disease without dyskinesia, without mention of fluctuations: Secondary | ICD-10-CM | POA: Diagnosis not present

## 2023-12-06 NOTE — Therapy (Signed)
 OUTPATIENT PHYSICAL THERAPY LOWER EXTREMITY TREATMENT   Patient Name: Morgan [name] MRN: [id number] DOB:02-21-1951, 73 y.o., female Today's Date: 12/06/2023      PT End of Session - 12/06/23 1101     Visit Number 48    Number of Visits 72    Date for PT Re-Evaluation 12/13/23    Authorization Type Progress note done at visit 26 next at 36    PT Start Time 1103    PT Stop Time 1143    PT Time Calculation (min) 40 min    Activity Tolerance Patient tolerated treatment well    Behavior During Therapy WFL for tasks assessed/performed                    Past Medical History:  Diagnosis Date   Anxiety    Fibromyalgia    Headache    Hypercholesteremia    controlled on medication   Hyperlipidemia    Hypertension    no current meds   LBBB (left bundle branch block)    Melanoma (HCC)    MVP (mitral valve prolapse)    Osteopenia    Parkinson disease (HCC)    Pneumonia    SVD (spontaneous vaginal delivery)    x 2   Past Surgical History:  Procedure Laterality Date   BLEPHAROPLASTY     cold knife conization     COLONOSCOPY     FH colon CA   CYSTOSCOPY W/ URETERAL STENT PLACEMENT Left 10/18/2021   Procedure: CYSTOSCOPY WITH RETROGRADE PYELOGRAM, URETERAL STENT PLACEMENT;  Surgeon: Jerilee Field, MD;  Location: [facility];  Service: Urology;  Laterality: Left;   DILATATION & CURETTAGE/HYSTEROSCOPY WITH MYOSURE N/A 02/14/2018   Procedure: ATTEMPTED DILATATION & CURETTAGE/HYSTEROSCOPY WITH MYOSURE;  Surgeon: Gerald Leitz, MD;  Location: [facility];  Service: Gynecology;  Laterality: N/A;  polyp   melanoma cancer     removed from left leg   RECONSTRUCTION OF EYELID     TONSILLECTOMY     TUBAL LIGATION     URETEROSCOPY WITH HOLMIUM LASER LITHOTRIPSY Left 12/17/2021   Procedure: URETEROSCOPY/HOLMIUM LASER/STENT EXCHANGE;  Surgeon: Jerilee Field, MD;  Location: [facility];  Service: Urology;  Laterality: Left;   WISDOM TOOTH EXTRACTION     Patient Active Problem List    Diagnosis Date Noted   COVID-19 virus infection 05/16/2022   HTN (hypertension) 10/20/2021   Physical deconditioning 10/20/2021   Hydronephrosis with infection 10/18/2021   Bacteremia due to Escherichia coli 10/18/2021   Severe sepsis (HCC) 10/17/2021   UTI (urinary tract infection) 10/17/2021   Acute metabolic encephalopathy 10/17/2021   Parkinson disease (HCC) 10/17/2021   Cervical stenosis (uterine cervix) 02/14/2018   Postmenopausal bleeding 02/14/2018   LBBB (left bundle branch block) 12/25/2015   PVCs (premature ventricular contractions) 12/25/2015   Hyperlipidemia 12/25/2015   Fibromyalgia 01/16/2014   Cholelithiasis 04/25/2013   Paralysis agitans (HCC) 01/15/2013   Hereditary and idiopathic peripheral neuropathy 01/15/2013     [name] MD PCP:   Cindi Carbon PROVIDER: Antoine Primas MD   REFERRING DIAG: Parkinsons   THERAPY DIAG:  Other abnormalities of gait and mobility  Parkinson's disease without dyskinesia or fluctuating manifestations (HCC)  Rationale for Evaluation and Treatment Rehabilitation  ONSET DATE: Several years   SUBJECTIVE:   SUBJECTIVE STATEMENT: Patient states morning pills took a while to kick in today.    PERTINENT HISTORY: Fibromyalgia; headaches, LBBB; osteopenia   PAIN:  Are you having pain? no: NPRS scale: 0/10 Location: Pain description:  Aggravating factors: just comes and goes  Relieving factors: just comes and goes; sometimes ibuprofen works but she is limited as to how much she can take  PRECAUTIONS: Patient reports that because of her heart she will pass out at times.   WEIGHT BEARING RESTRICTIONS No  FALLS:  Has patient fallen in last 6 months? Yes.  6 falls but likley more. Last fall    LIVING ENVIRONMENT: Stairs to the second floor in her house but she dosen't do much  4 steps in the front  2 steps in the garage  OCCUPATION: retired   Presenter, broadcasting: reading; goes to an exercise class   PLOF: Independent with  household mobility with device  PATIENT GOALS  To have less pain in her legs and to move better    OBJECTIVE:  (measurements taken at evaluation unless otherwise noted)   DIAGNOSTIC FINDINGS:   PATIENT SURVEYS:  FOTO    COGNITION:  Overall cognitive status: Within functional limits for tasks assessed     SENSATION: Denies paresthesias   POSTURE: No Significant postural limitations  PALPATION: No trigger points noted   LOWER EXTREMITY ROM:  Passive ROM Right eval Left eval  Hip flexion    Hip extension    Hip abduction    Hip adduction    Hip internal rotation    Hip external rotation    Knee flexion    Knee extension    Ankle dorsiflexion    Ankle plantarflexion    Ankle inversion    Ankle eversion     (Blank rows = not tested)  WNL  LOWER EXTREMITY MMT:  MMT Right eval Left eval Right  7/22 Left  7/2 Right  9/12 Left 9/12 Right 06/13/23 Left 06/13/23 Right 08/02/23 Left 08/02/23 Right 10/18/23 Left 10/18/23 Right  Left   Hip flexion 21.7 29.7 23.5 25.9 24.6 25.6 41.3 37.8 47 45.5 44.9 46    Hip extension                Hip abduction 27.6 18.5 19.1 8.7 21.2 14.4 29.9 29.5 35.5 35.3 25.7 35.5    Hip adduction                Hip internal rotation                Hip external rotation                Knee flexion                Knee extension 23.5 20.2 17.7 16.8 22.7 16.3 39.4 49.8 48.8 53.5 48 42.3    Ankle dorsiflexion                Ankle plantarflexion                Ankle inversion                Ankle eversion                 (Blank rows = not tested)  GAIT: Decreased hip flexion; leans to the left; right hip frop. Patient required CGA while walking and min a when she turned for balance. No change from IE   Functional tests:  Sit to stand: cuing to catch balance. Uses momentum to stand.    5x sit to stand Balance: 13 sec   9/12 5x sit to stand 13 sec Narrow base min a  Narrow base eyes closed min to mod a  Tandem stance Min a  both ways.  Single leg stance 5 sec bilateral     06/13/23: 285 feet with walking stick, CGA  for safety  Reassessment 08/02/23:  258 feet with walking stick, CGA  for safety 5xSTS: 13.5 seconds with poor eccentric control  Reassessment 10/18/23:  - deferred today due to PT injury 5xSTS: 13.36 seconds with poor eccentric control, posterior lean with transfer/standing  Reassessment 10/18/23:  - deferred today due to PT injury 5xSTS: 10.97 seconds with 3/4 STS each rep, - took 4 attempts for patient to complete that due to impaired initation poor eccentric control, posterior lean with transfer/standing  BERG Balance Test          Date:      Sit to Stand 1 2 06/13/23: 2 08/02/23: 3 10/18/23: 3 11/22/23: 3  Standing unsupported 3 3 3 3 3 3   Sitting with back unsupported but feet supported 1 3 4 4 4 4   Stand to sit  1 2 3 3 3 1   Transfers  2 1 2 2 3 2   Standing unsupported with eyes closed 3 3 3 3 3 3   Standing unsupported feet together 3 3 3 3 3 3   From standing position, reach forward with outstretched arm 1 2 3 3 3 3   From standing position, pick up object from floor 1 2 3 3 3 3   From standing position, turn and look behind over each shoulder 1 2 2 3 3 3   Turn 360 2 1 2 2 2 2   Standing unsupported, alternately place foot on step 1 1 2 2 2 2   Standing unsupported, one foot in front 1 1 2 2 2 2   Standing on one leg 0 0 2 1 2 1   Total:  21/56 26/56 34/56  33/56 36/56 35/56       TODAY'S TREATMENT: 12/06/23 Nustep level 5 seat 8,  5 minutes STS with overhead reach 2x10 Step up onto airex 2 x 10 Hurdle step over 2 x 10 forward and lateral Step taps 6 inch 2 x 30 Seated bicep curl and shoulder press 4# 2 x 10   12/04/23 There-ex:   Nu-step 5 min L3   There-act:  Sit to stand 3x3 with CGA for balance  Hurdle step over 2x10 each  fwd and lateral   Neuro -re -ed  Step onto air-ex 2x10  Ai-ex rock 2x10   There-ex  Bicpes curls with posture 2x10 4 lbs  Punches with  posture 2x10 4 lbs  Press up 2x10 4 lbs   11/29/23 Nustep level 5 seat 8,  10 minutes with subjective and POC discussion with patient/spouse Seated bicep curl and shoulder press 4# 2 x 10 Seated punches 4# 3 x 10 Step taps 6 inch 2 x 30 STS with overhead reach 2x10 Seated tricep dips 2 x 10    11/25/23 Nustep level 5, seat 8, 5 minutes   11/22/23 Nustep level 5, seat 8, 10 minutes with subjective and POC discussion with patient/spouse Reassessment   11/18/23 Nustep level 5, seat 8, 5 minutes STS with overhead reach 3x10  Seated bicep curl and shoulder press 4# 2 x 10 Anterior stepping with overhead reach 2 x 10 Lateral stepping with overhead reach 2 x 10 Shoulder flexion 1# 2 x 10  Seated shoulder abduction 1# 2 x 10 LAQ 3# 2x 10  11/15/23 Nustep level 5, seat 8, 6 minutes Bicep curl and shoulder press 3# 2 x 10 Shoulder flexion 1# 2  x 10  Seated ab iso with ball 2x 10 x 5 second holds Seated row GTB 2 x 15 Seated shoulder extension GTB 2 x 15   11/11/23 Nustep 5 minutes for dynamic warm up and conditioning level 5, seat 7 Seated shoulder press 4# 3 x 10  Seated punches 4# 3 x 10 Gait with RW 1 x 40 feet, 1 x 100 feet; gait with walking stick 1 x 100 Seated ab iso with ball 10 x 5 second holds Anterior stepping with overhead reach 2 x 12 Lateral stepping with overhead reach 2 x 10  11/08/23 Seated shoulder press 3# 2 x 10  Seated LAQ 2 x 10 with 5 second holds Seated hip adduction isometric 10 x 10 second holds Seated hip abduction isometric 10 x 10 second holds Seated bicep curls 3# bar 2 x 20   11/04/23 Nustep 5 minutes for dynamic warm up and conditioning level 5, seat 7 Hamstring curl machine 30# 2 x 10  Leg press machine 10# 2 x 10  Knee extension 10# 2 x 10 Hip adduction 40# 3 x 15 Hip abduction 40# 3 x 15  PATIENT EDUCATION:  Education details: exercise rationale and technique   Person educated: Patient Education method: Explanation, Demonstration,  Tactile cues, Verbal cues, Education comprehension: verbalized understanding, returned demonstration, verbal cues required, tactile cues required, and needs further education   HOME EXERCISE PROGRAM: Access Code: [email or web address]   URL: [email or web address] Date: 06/28/2023 - Seated March with Resistance  - 1 x daily - 7 x weekly - 3 sets - 15 reps - Seated Hip Abduction with Resistance  - 1 x daily - 7 x weekly - 3 sets - 15 reps - Seated Long Arc Quad  - 1 x daily - 7 x weekly - 3 sets - 15 reps - Shoulder External Rotation and Scapular Retraction with Resistance  - 1 x daily - 7 x weekly - 2 sets - 10 reps - Seated Shoulder Horizontal Abduction with Resistance  - 1 x daily - 7 x weekly - 2 sets - 10 reps  08/11/23 - Seated Punches  - 1 x daily - 7 x weekly - 3 sets - 10 reps - Seated Shoulder Overhead Press with Dumbbells with PLB  - 1 x daily - 7 x weekly - 3 sets - 10 reps - Seated Bicep Curls Supinated with Dumbbells  - 1 x daily - 7 x weekly - 3 sets - 10 reps - Seated Shoulder Abduction with Dumbbells - Thumbs Up  - 1 x daily - 7 x weekly - 3 sets - 10 reps  ASSESSMENT:  CLINICAL IMPRESSION: Patient with improved STS with reduced retropulsion today. Frequent cueing for mechanics/sequencing with step up exercise but poor carry over. Overall performed really well in standing today. Patient will continue to benefit from physical therapy in order to improve function and reduce impairment.    OBJECTIVE IMPAIRMENTS Abnormal gait, decreased activity tolerance, decreased balance, decreased mobility, difficulty walking, decreased strength, impaired tone, and pain.   ACTIVITY LIMITATIONS carrying, lifting, bending, standing, squatting, stairs, transfers, bed mobility, dressing, and locomotion level  PARTICIPATION LIMITATIONS: meal prep, cleaning, laundry, driving, shopping, community activity, and yard work  PERSONAL FACTORS Fibromyalgia; headaches, LBBB; osteopenia  are also  affecting patient's functional outcome.   REHAB POTENTIAL: Good  CLINICAL DECISION MAKING: Evolving/moderate complexity increasing pain in her quads   EVALUATION COMPLEXITY: Moderate   GOALS: Goals reviewed with patient? Yes  SHORT TERM GOALS: Target date: 04/18/2023  Patient will transfer sit to stand smoothly and independently  Baseline: Goal status: depends on the day 9/12  2.  Patient will increase gross strength by 5lbs  Baseline:  Goal status: MET  3.  Patient will improve BERG score by 10  Baseline:  Goal status: MET LONG TERM GOALS: Target date:POC  Patient will go improve balance to CGA for all balance exercises to improve safety  Baseline:  Goal status: INITIAL  2.  Patient will report a 50% reduction in falls  Baseline:  Goal status: INITIAL  3.  Patient will have a full balance program  Baseline:  Goal status: INITIAL 4. Patient will score at least 38/56 on Berg balance scale in order to demonstrate improved balance to reduce the risk for falls at home and in the community.  Baseline:  Goal status: INITIAL   PLAN: PT FREQUENCY: 2x/week  PT DURATION: 8 weeks  PLANNED INTERVENTIONS: Therapeutic exercises, Therapeutic activity, Neuromuscular re-education, Balance training, Gait training, Patient/Family education, Self Care, Joint mobilization, Stair training, Aquatic Therapy, Cryotherapy, Moist heat, Taping, Manual therapy, and Re-evaluation  PLAN FOR NEXT SESSION: work on improving sit to stand, posterior, dynamic, and static balance. Work on general conditioning. Be aware of leg pain on any given day.    Wyman Songster, PT DPT 12/06/2023, 11:01 AM

## 2023-12-09 ENCOUNTER — Ambulatory Visit (HOSPITAL_BASED_OUTPATIENT_CLINIC_OR_DEPARTMENT_OTHER): Admitting: Physical Therapy

## 2023-12-09 ENCOUNTER — Encounter (HOSPITAL_BASED_OUTPATIENT_CLINIC_OR_DEPARTMENT_OTHER): Payer: Self-pay | Admitting: Physical Therapy

## 2023-12-09 DIAGNOSIS — R2689 Other abnormalities of gait and mobility: Secondary | ICD-10-CM | POA: Diagnosis not present

## 2023-12-09 DIAGNOSIS — G20A1 Parkinson's disease without dyskinesia, without mention of fluctuations: Secondary | ICD-10-CM | POA: Diagnosis not present

## 2023-12-09 NOTE — Therapy (Signed)
 OUTPATIENT PHYSICAL THERAPY LOWER EXTREMITY TREATMENT   Patient Name: Morgan [name] MRN: [id number] DOB:[birth year], 73 y.o., female Today's Date: 12/09/2023      PT End of Session - 12/09/23 1136     Visit Number 49    Number of Visits 72    Date for PT Re-Evaluation 12/13/23    Authorization Type Progress note done at visit 26 next at 36    PT Start Time 1130    PT Stop Time 1201    PT Time Calculation (min) 31 min    Activity Tolerance Patient tolerated treatment well    Behavior During Therapy WFL for tasks assessed/performed                    Past Medical History:  Diagnosis Date   Anxiety    Fibromyalgia    Headache    Hypercholesteremia    controlled on medication   Hyperlipidemia    Hypertension    no current meds   LBBB (left bundle branch block)    Melanoma (HCC)    MVP (mitral valve prolapse)    Osteopenia    Parkinson disease (HCC)    Pneumonia    SVD (spontaneous vaginal delivery)    x 2   Past Surgical History:  Procedure Laterality Date   BLEPHAROPLASTY     cold knife conization     COLONOSCOPY     FH colon CA   CYSTOSCOPY W/ URETERAL STENT PLACEMENT Left 10/18/2021   Procedure: CYSTOSCOPY WITH RETROGRADE PYELOGRAM, URETERAL STENT PLACEMENT;  Surgeon: Christina Coyer, MD;  Location: [facility];  Service: Urology;  Laterality: Left;   DILATATION & CURETTAGE/HYSTEROSCOPY WITH MYOSURE N/A 02/14/2018   Procedure: ATTEMPTED DILATATION & CURETTAGE/HYSTEROSCOPY WITH MYOSURE;  Surgeon: Arlee Lace, MD;  Location: [facility];  Service: Gynecology;  Laterality: N/A;  polyp   melanoma cancer     removed from left leg   RECONSTRUCTION OF EYELID     TONSILLECTOMY     TUBAL LIGATION     URETEROSCOPY WITH HOLMIUM LASER LITHOTRIPSY Left 12/17/2021   Procedure: URETEROSCOPY/HOLMIUM LASER/STENT EXCHANGE;  Surgeon: Christina Coyer, MD;  Location: [facility];  Service: Urology;  Laterality: Left;   WISDOM TOOTH EXTRACTION     Patient Active Problem List    Diagnosis Date Noted   COVID-19 virus infection 05/16/2022   HTN (hypertension) 10/20/2021   Physical deconditioning 10/20/2021   Hydronephrosis with infection 10/18/2021   Bacteremia due to Escherichia coli 10/18/2021   Severe sepsis (HCC) 10/17/2021   UTI (urinary tract infection) 10/17/2021   Acute metabolic encephalopathy 10/17/2021   Parkinson disease (HCC) 10/17/2021   Cervical stenosis (uterine cervix) 02/14/2018   Postmenopausal bleeding 02/14/2018   LBBB (left bundle branch block) 12/25/2015   PVCs (premature ventricular contractions) 12/25/2015   Hyperlipidemia 12/25/2015   Fibromyalgia 01/16/2014   Cholelithiasis 04/25/2013   Paralysis agitans (HCC) 01/15/2013   Hereditary and idiopathic peripheral neuropathy 01/15/2013     Faustina Hood MD PCP:   Waunita Haff PROVIDER: Ronnell Coins MD   REFERRING DIAG: Parkinsons   THERAPY DIAG:  Other abnormalities of gait and mobility  Parkinson's disease without dyskinesia or fluctuating manifestations (HCC)  Rationale for Evaluation and Treatment Rehabilitation  ONSET DATE: Several years   SUBJECTIVE:   SUBJECTIVE STATEMENT: Patient states has a headache this morning. Was moving slowly yesterday, felt it was weather related as it was rainy.    PERTINENT HISTORY: Fibromyalgia; headaches, LBBB; osteopenia   PAIN:  Are you having pain? no:  NPRS scale: 0/10 Location: Pain description:    Aggravating factors: just comes and goes  Relieving factors: just comes and goes; sometimes ibuprofen works but she is limited as to how much she can take  PRECAUTIONS: Patient reports that because of her heart she will pass out at times.   WEIGHT BEARING RESTRICTIONS No  FALLS:  Has patient fallen in last 6 months? Yes.  6 falls but likley more. Last fall    LIVING ENVIRONMENT: Stairs to the second floor in her house but she dosen't do much  4 steps in the front  2 steps in the garage  OCCUPATION: retired   Presenter, broadcasting:  reading; goes to an exercise class   PLOF: Independent with household mobility with device  PATIENT GOALS  To have less pain in her legs and to move better    OBJECTIVE:  (measurements taken at evaluation unless otherwise noted)   DIAGNOSTIC FINDINGS:   PATIENT SURVEYS:  FOTO    COGNITION:  Overall cognitive status: Within functional limits for tasks assessed     SENSATION: Denies paresthesias   POSTURE: No Significant postural limitations  PALPATION: No trigger points noted   LOWER EXTREMITY ROM:  Passive ROM Right eval Left eval  Hip flexion    Hip extension    Hip abduction    Hip adduction    Hip internal rotation    Hip external rotation    Knee flexion    Knee extension    Ankle dorsiflexion    Ankle plantarflexion    Ankle inversion    Ankle eversion     (Blank rows = not tested)  WNL  LOWER EXTREMITY MMT:  MMT Right eval Left eval Right  7/22 Left  7/2 Right  9/12 Left 9/12 Right 06/13/23 Left 06/13/23 Right 08/02/23 Left 08/02/23 Right 10/18/23 Left 10/18/23 Right  Left   Hip flexion 21.7 29.7 23.5 25.9 24.6 25.6 41.3 37.8 47 45.5 44.9 46    Hip extension                Hip abduction 27.6 18.5 19.1 8.7 21.2 14.4 29.9 29.5 35.5 35.3 25.7 35.5    Hip adduction                Hip internal rotation                Hip external rotation                Knee flexion                Knee extension 23.5 20.2 17.7 16.8 22.7 16.3 39.4 49.8 48.8 53.5 48 42.3    Ankle dorsiflexion                Ankle plantarflexion                Ankle inversion                Ankle eversion                 (Blank rows = not tested)  GAIT: Decreased hip flexion; leans to the left; right hip frop. Patient required CGA while walking and min a when she turned for balance. No change from IE   Functional tests:  Sit to stand: cuing to catch balance. Uses momentum to stand.    5x sit to stand Balance: 13 sec   9/12 5x sit to stand 13 sec Narrow base min a  Narrow base eyes closed min to mod a  Tandem stance Min a both ways.  Single leg stance 5 sec bilateral     06/13/23: 285 feet with walking stick, CGA  for safety  Reassessment 08/02/23:  258 feet with walking stick, CGA  for safety 5xSTS: 13.5 seconds with poor eccentric control  Reassessment 10/18/23:  - deferred today due to PT injury 5xSTS: 13.36 seconds with poor eccentric control, posterior lean with transfer/standing  Reassessment 10/18/23:  - deferred today due to PT injury 5xSTS: 10.97 seconds with 3/4 STS each rep, - took 4 attempts for patient to complete that due to impaired initation poor eccentric control, posterior lean with transfer/standing  BERG Balance Test          Date:      Sit to Stand 1 2 06/13/23: 2 08/02/23: 3 10/18/23: 3 11/22/23: 3  Standing unsupported 3 3 3 3 3 3   Sitting with back unsupported but feet supported 1 3 4 4 4 4   Stand to sit  1 2 3 3 3 1   Transfers  2 1 2 2 3 2   Standing unsupported with eyes closed 3 3 3 3 3 3   Standing unsupported feet together 3 3 3 3 3 3   From standing position, reach forward with outstretched arm 1 2 3 3 3 3   From standing position, pick up object from floor 1 2 3 3 3 3   From standing position, turn and look behind over each shoulder 1 2 2 3 3 3   Turn 360 2 1 2 2 2 2   Standing unsupported, alternately place foot on step 1 1 2 2 2 2   Standing unsupported, one foot in front 1 1 2 2 2 2   Standing on one leg 0 0 2 1 2 1   Total:  21/56 26/56 34/56  33/56 36/56 35/56       TODAY'S TREATMENT: 12/09/23 Nustep level 5 seat 8,  5 minutes Seated bicep curl and shoulder press 4# 2 x 10 Gait with cueing for step length 40 feet    12/06/23 Nustep level 5 seat 8,  5 minutes STS with overhead reach 2x10 Step up onto airex 2 x 10 Hurdle step over 2 x 10 forward and lateral Step taps 6 inch 2 x 30 Seated bicep curl and shoulder press 4# 2 x 10   12/04/23 There-ex:   Nu-step 5 min L3   There-act:  Sit to  stand 3x3 with CGA for balance  Hurdle step over 2x10 each  fwd and lateral   Neuro -re -ed  Step onto air-ex 2x10  Ai-ex rock 2x10   There-ex  Bicpes curls with posture 2x10 4 lbs  Punches with posture 2x10 4 lbs  Press up 2x10 4 lbs   11/29/23 Nustep level 5 seat 8,  10 minutes with subjective and POC discussion with patient/spouse Seated bicep curl and shoulder press 4# 2 x 10 Seated punches 4# 3 x 10 Step taps 6 inch 2 x 30 STS with overhead reach 2x10 Seated tricep dips 2 x 10    11/25/23 Nustep level 5, seat 8, 5 minutes   11/22/23 Nustep level 5, seat 8, 10 minutes with subjective and POC discussion with patient/spouse Reassessment   11/18/23 Nustep level 5, seat 8, 5 minutes STS with overhead reach 3x10  Seated bicep curl and shoulder press 4# 2 x 10 Anterior stepping with overhead reach 2 x 10 Lateral stepping with overhead reach 2 x 10 Shoulder flexion  1# 2 x 10  Seated shoulder abduction 1# 2 x 10 LAQ 3# 2x 10  11/15/23 Nustep level 5, seat 8, 6 minutes Bicep curl and shoulder press 3# 2 x 10 Shoulder flexion 1# 2 x 10  Seated ab iso with ball 2x 10 x 5 second holds Seated row GTB 2 x 15 Seated shoulder extension GTB 2 x 15   11/11/23 Nustep 5 minutes for dynamic warm up and conditioning level 5, seat 7 Seated shoulder press 4# 3 x 10  Seated punches 4# 3 x 10 Gait with RW 1 x 40 feet, 1 x 100 feet; gait with walking stick 1 x 100 Seated ab iso with ball 10 x 5 second holds Anterior stepping with overhead reach 2 x 12 Lateral stepping with overhead reach 2 x 10  11/08/23 Seated shoulder press 3# 2 x 10  Seated LAQ 2 x 10 with 5 second holds Seated hip adduction isometric 10 x 10 second holds Seated hip abduction isometric 10 x 10 second holds Seated bicep curls 3# bar 2 x 20   11/04/23 Nustep 5 minutes for dynamic warm up and conditioning level 5, seat 7 Hamstring curl machine 30# 2 x 10  Leg press machine 10# 2 x 10  Knee extension 10# 2 x  10 Hip adduction 40# 3 x 15 Hip abduction 40# 3 x 15  PATIENT EDUCATION:  Education details: exercise rationale and technique   Person educated: Patient Education method: Explanation, Demonstration, Tactile cues, Verbal cues, Education comprehension: verbalized understanding, returned demonstration, verbal cues required, tactile cues required, and needs further education   HOME EXERCISE PROGRAM: Access Code: [email or web address]   URL: [email or web address] Date: 06/28/2023 - Seated March with Resistance  - 1 x daily - 7 x weekly - 3 sets - 15 reps - Seated Hip Abduction with Resistance  - 1 x daily - 7 x weekly - 3 sets - 15 reps - Seated Long Arc Quad  - 1 x daily - 7 x weekly - 3 sets - 15 reps - Shoulder External Rotation and Scapular Retraction with Resistance  - 1 x daily - 7 x weekly - 2 sets - 10 reps - Seated Shoulder Horizontal Abduction with Resistance  - 1 x daily - 7 x weekly - 2 sets - 10 reps  08/11/23 - Seated Punches  - 1 x daily - 7 x weekly - 3 sets - 10 reps - Seated Shoulder Overhead Press with Dumbbells with PLB  - 1 x daily - 7 x weekly - 3 sets - 10 reps - Seated Bicep Curls Supinated with Dumbbells  - 1 x daily - 7 x weekly - 3 sets - 10 reps - Seated Shoulder Abduction with Dumbbells - Thumbs Up  - 1 x daily - 7 x weekly - 3 sets - 10 reps  ASSESSMENT:  CLINICAL IMPRESSION: Patient with more labored mobility likely due to headache and parkinson's symptoms combined. Patient requiring increased rest breaks today and eyes held shut most of session. Worked on ambulating in clinic to see if increased mobility improved symptoms without success and overall with high LE fatigue quickly. Session limited to symptoms today. Patient will continue to benefit from physical therapy in order to improve function and reduce impairment.    OBJECTIVE IMPAIRMENTS Abnormal gait, decreased activity tolerance, decreased balance, decreased mobility, difficulty walking, decreased  strength, impaired tone, and pain.   ACTIVITY LIMITATIONS carrying, lifting, bending, standing, squatting, stairs, transfers, bed mobility, dressing, and locomotion level  PARTICIPATION  LIMITATIONS: meal prep, cleaning, laundry, driving, shopping, community activity, and yard work  PERSONAL FACTORS Fibromyalgia; headaches, LBBB; osteopenia  are also affecting patient's functional outcome.   REHAB POTENTIAL: Good  CLINICAL DECISION MAKING: Evolving/moderate complexity increasing pain in her quads   EVALUATION COMPLEXITY: Moderate   GOALS: Goals reviewed with patient? Yes  SHORT TERM GOALS: Target date: 04/18/2023  Patient will transfer sit to stand smoothly and independently  Baseline: Goal status: depends on the day 9/12  2.  Patient will increase gross strength by 5lbs  Baseline:  Goal status: MET  3.  Patient will improve BERG score by 10  Baseline:  Goal status: MET LONG TERM GOALS: Target date:POC  Patient will go improve balance to CGA for all balance exercises to improve safety  Baseline:  Goal status: INITIAL  2.  Patient will report a 50% reduction in falls  Baseline:  Goal status: INITIAL  3.  Patient will have a full balance program  Baseline:  Goal status: INITIAL 4. Patient will score at least 38/56 on Berg balance scale in order to demonstrate improved balance to reduce the risk for falls at home and in the community.  Baseline:  Goal status: INITIAL   PLAN: PT FREQUENCY: 2x/week  PT DURATION: 8 weeks  PLANNED INTERVENTIONS: Therapeutic exercises, Therapeutic activity, Neuromuscular re-education, Balance training, Gait training, Patient/Family education, Self Care, Joint mobilization, Stair training, Aquatic Therapy, Cryotherapy, Moist heat, Taping, Manual therapy, and Re-evaluation  PLAN FOR NEXT SESSION: work on improving sit to stand, posterior, dynamic, and static balance. Work on general conditioning. Be aware of leg pain on any given day.     Candita Borenstein S Catrena Vari, PT DPT 12/09/2023, 12:01 PM

## 2023-12-13 ENCOUNTER — Ambulatory Visit (HOSPITAL_BASED_OUTPATIENT_CLINIC_OR_DEPARTMENT_OTHER): Admitting: Physical Therapy

## 2023-12-13 ENCOUNTER — Encounter (HOSPITAL_BASED_OUTPATIENT_CLINIC_OR_DEPARTMENT_OTHER): Payer: Self-pay | Admitting: Physical Therapy

## 2023-12-13 DIAGNOSIS — R2689 Other abnormalities of gait and mobility: Secondary | ICD-10-CM | POA: Diagnosis not present

## 2023-12-13 DIAGNOSIS — G20A1 Parkinson's disease without dyskinesia, without mention of fluctuations: Secondary | ICD-10-CM | POA: Diagnosis not present

## 2023-12-13 NOTE — Therapy (Signed)
 OUTPATIENT PHYSICAL THERAPY LOWER EXTREMITY TREATMENT   Patient Name: Morgan [name] MRN: [id number] DOB:01-18-1951, 73 y.o., female Today's Date: 12/13/2023   Progress Note   Reporting Period 11/22/23 to 12/13/23   See note below for Objective Data and Assessment of Progress/Goals      PT End of Session - 12/13/23 0930     Visit Number 50    Number of Visits 84    Date for PT Re-Evaluation 01/24/24    Authorization Type --    Progress Note Due on Visit 60    PT Start Time 0932    PT Stop Time 1012    PT Time Calculation (min) 40 min    Activity Tolerance Patient tolerated treatment well    Behavior During Therapy WFL for tasks assessed/performed                    Past Medical History:  Diagnosis Date   Anxiety    Fibromyalgia    Headache    Hypercholesteremia    controlled on medication   Hyperlipidemia    Hypertension    no current meds   LBBB (left bundle branch block)    Melanoma (HCC)    MVP (mitral valve prolapse)    Osteopenia    Parkinson disease (HCC)    Pneumonia    SVD (spontaneous vaginal delivery)    x 2   Past Surgical History:  Procedure Laterality Date   BLEPHAROPLASTY     cold knife conization     COLONOSCOPY     FH colon CA   CYSTOSCOPY W/ URETERAL STENT PLACEMENT Left 10/18/2021   Procedure: CYSTOSCOPY WITH RETROGRADE PYELOGRAM, URETERAL STENT PLACEMENT;  Surgeon: Christina Coyer, MD;  Location: [facility];  Service: Urology;  Laterality: Left;   DILATATION & CURETTAGE/HYSTEROSCOPY WITH MYOSURE N/A 02/14/2018   Procedure: ATTEMPTED DILATATION & CURETTAGE/HYSTEROSCOPY WITH MYOSURE;  Surgeon: Arlee Lace, MD;  Location: [facility];  Service: Gynecology;  Laterality: N/A;  polyp   melanoma cancer     removed from left leg   RECONSTRUCTION OF EYELID     TONSILLECTOMY     TUBAL LIGATION     URETEROSCOPY WITH HOLMIUM LASER LITHOTRIPSY Left 12/17/2021   Procedure: URETEROSCOPY/HOLMIUM LASER/STENT EXCHANGE;  Surgeon: Christina Coyer,  MD;  Location: [facility];  Service: Urology;  Laterality: Left;   WISDOM TOOTH EXTRACTION     Patient Active Problem List   Diagnosis Date Noted   COVID-19 virus infection 05/16/2022   HTN (hypertension) 10/20/2021   Physical deconditioning 10/20/2021   Hydronephrosis with infection 10/18/2021   Bacteremia due to Escherichia coli 10/18/2021   Severe sepsis (HCC) 10/17/2021   UTI (urinary tract infection) 10/17/2021   Acute metabolic encephalopathy 10/17/2021   Parkinson disease (HCC) 10/17/2021   Cervical stenosis (uterine cervix) 02/14/2018   Postmenopausal bleeding 02/14/2018   LBBB (left bundle branch block) 12/25/2015   PVCs (premature ventricular contractions) 12/25/2015   Hyperlipidemia 12/25/2015   Fibromyalgia 01/16/2014   Cholelithiasis 04/25/2013   Paralysis agitans (HCC) 01/15/2013   Hereditary and idiopathic peripheral neuropathy 01/15/2013     Faustina Hood MD PCP:   Waunita Haff PROVIDER: Ronnell Coins MD   REFERRING DIAG: Parkinsons   THERAPY DIAG:  Other abnormalities of gait and mobility  Parkinson's disease without dyskinesia or fluctuating manifestations (HCC)  Rationale for Evaluation and Treatment Rehabilitation  ONSET DATE: Several years   SUBJECTIVE:   SUBJECTIVE STATEMENT: Patient states having a pretty good day today.    PERTINENT HISTORY: Fibromyalgia;  headaches, LBBB; osteopenia   PAIN:  Are you having pain? no: NPRS scale: 0/10 Location: Pain description:    Aggravating factors: just comes and goes  Relieving factors: just comes and goes; sometimes ibuprofen works but she is limited as to how much she can take  PRECAUTIONS: Patient reports that because of her heart she will pass out at times.   WEIGHT BEARING RESTRICTIONS No  FALLS:  Has patient fallen in last 6 months? Yes.  6 falls but likley more. Last fall    LIVING ENVIRONMENT: Stairs to the second floor in her house but she dosen't do much  4 steps in the front  2 steps  in the garage  OCCUPATION: retired   Presenter, broadcasting: reading; goes to an exercise class   PLOF: Independent with household mobility with device  PATIENT GOALS  To have less pain in her legs and to move better    OBJECTIVE:  (measurements taken at evaluation unless otherwise noted)   DIAGNOSTIC FINDINGS:   PATIENT SURVEYS:  FOTO    COGNITION:  Overall cognitive status: Within functional limits for tasks assessed     SENSATION: Denies paresthesias   POSTURE: No Significant postural limitations  PALPATION: No trigger points noted   LOWER EXTREMITY ROM:  Passive ROM Right eval Left eval  Hip flexion    Hip extension    Hip abduction    Hip adduction    Hip internal rotation    Hip external rotation    Knee flexion    Knee extension    Ankle dorsiflexion    Ankle plantarflexion    Ankle inversion    Ankle eversion     (Blank rows = not tested)  WNL  LOWER EXTREMITY MMT:  MMT Right eval Left eval Right  7/22 Left  7/2 Right  9/12 Left 9/12 Right 06/13/23 Left 06/13/23 Right 08/02/23 Left 08/02/23 Right 10/18/23 Left 10/18/23 Right 12/13/23 Left 12/13/23  Hip flexion 21.7 29.7 23.5 25.9 24.6 25.6 41.3 37.8 47 45.5 44.9 46 49.1 50.5  Hip extension                Hip abduction 27.6 18.5 19.1 8.7 21.2 14.4 29.9 29.5 35.5 35.3 25.7 35.5 35.3 41.9  Hip adduction                Hip internal rotation                Hip external rotation                Knee flexion                Knee extension 23.5 20.2 17.7 16.8 22.7 16.3 39.4 49.8 48.8 53.5 48 42.3 40 40.9  Ankle dorsiflexion                Ankle plantarflexion                Ankle inversion                Ankle eversion                 (Blank rows = not tested)  GAIT: Decreased hip flexion; leans to the left; right hip frop. Patient required CGA while walking and min a when she turned for balance. No change from IE   Functional tests:  Sit to stand: cuing to catch balance. Uses momentum to stand.    5x  sit to stand Balance: 13 sec  9/12 5x sit to stand 13 sec Narrow base min a  Narrow base eyes closed min to mod a  Tandem stance Min a both ways.  Single leg stance 5 sec bilateral     06/13/23: 285 feet with walking stick, CGA  for safety  Reassessment 08/02/23:  258 feet with walking stick, CGA  for safety 5xSTS: 13.5 seconds with poor eccentric control  Reassessment 10/18/23:  - deferred today due to PT injury 5xSTS: 13.36 seconds with poor eccentric control, posterior lean with transfer/standing  Reassessment 10/18/23:  - deferred today due to PT injury 5xSTS: 10.97 seconds with 3/4 STS each rep, - took 4 attempts for patient to complete that due to impaired initation poor eccentric control, posterior lean with transfer/standing  Reassessment 12/13/23:  - 272 feet with intermittent unsteadiness and cueing for breathing and step length when shuffling 5xSTS: 10.01 seconds without UE support with decreased eccentric control  BERG Balance Test          Date:      Sit to Stand 1 2 06/13/23: 2 08/02/23: 3 10/18/23: 3 11/22/23: 3 12/13/23: 3  Standing unsupported 3 3 3 3 3 3 3   Sitting with back unsupported but feet supported 1 3 4 4 4 4 4   Stand to sit  1 2 3 3 3 1 3   Transfers  2 1 2 2 3 2 3   Standing unsupported with eyes closed 3 3 3 3 3 3 3   Standing unsupported feet together 3 3 3 3 3 3 3   From standing position, reach forward with outstretched arm 1 2 3 3 3 3 3   From standing position, pick up object from floor 1 2 3 3 3 3 3   From standing position, turn and look behind over each shoulder 1 2 2 3 3 3 3   Turn 360 2 1 2 2 2 2 2   Standing unsupported, alternately place foot on step 1 1 2 2 2 2 2   Standing unsupported, one foot in front 1 1 2 2 2 2 2   Standing on one leg 0 0 2 1 2 1 2   Total:  21/56 26/56 34/56  33/56 36/56 35/56  39/56      TODAY'S TREATMENT: 12/12/23 Nustep level 5 seat 8,  5 minutes Reassessment Hurdle step over 2 x 10 forward and  lateral   12/09/23 Nustep level 5 seat 8,  5 minutes Seated bicep curl and shoulder press 4# 2 x 10 Gait with cueing for step length 40 feet    12/06/23 Nustep level 5 seat 8,  5 minutes STS with overhead reach 2x10 Step up onto airex 2 x 10 Hurdle step over 2 x 10 forward and lateral Step taps 6 inch 2 x 30 Seated bicep curl and shoulder press 4# 2 x 10   12/04/23 There-ex:   Nu-step 5 min L3   There-act:  Sit to stand 3x3 with CGA for balance  Hurdle step over 2x10 each  fwd and lateral   Neuro -re -ed  Step onto air-ex 2x10  Ai-ex rock 2x10   There-ex  Bicpes curls with posture 2x10 4 lbs  Punches with posture 2x10 4 lbs  Press up 2x10 4 lbs   11/29/23 Nustep level 5 seat 8,  10 minutes with subjective and POC discussion with patient/spouse Seated bicep curl and shoulder press 4# 2 x 10 Seated punches 4# 3 x 10 Step taps 6 inch 2 x 30 STS with overhead reach 2x10  Seated tricep dips 2 x 10    11/25/23 Nustep level 5, seat 8, 5 minutes   11/22/23 Nustep level 5, seat 8, 10 minutes with subjective and POC discussion with patient/spouse Reassessment   11/18/23 Nustep level 5, seat 8, 5 minutes STS with overhead reach 3x10  Seated bicep curl and shoulder press 4# 2 x 10 Anterior stepping with overhead reach 2 x 10 Lateral stepping with overhead reach 2 x 10 Shoulder flexion 1# 2 x 10  Seated shoulder abduction 1# 2 x 10 LAQ 3# 2x 10  11/15/23 Nustep level 5, seat 8, 6 minutes Bicep curl and shoulder press 3# 2 x 10 Shoulder flexion 1# 2 x 10  Seated ab iso with ball 2x 10 x 5 second holds Seated row GTB 2 x 15 Seated shoulder extension GTB 2 x 15   11/11/23 Nustep 5 minutes for dynamic warm up and conditioning level 5, seat 7 Seated shoulder press 4# 3 x 10  Seated punches 4# 3 x 10 Gait with RW 1 x 40 feet, 1 x 100 feet; gait with walking stick 1 x 100 Seated ab iso with ball 10 x 5 second holds Anterior stepping with overhead reach 2 x 12 Lateral  stepping with overhead reach 2 x 10  11/08/23 Seated shoulder press 3# 2 x 10  Seated LAQ 2 x 10 with 5 second holds Seated hip adduction isometric 10 x 10 second holds Seated hip abduction isometric 10 x 10 second holds Seated bicep curls 3# bar 2 x 20   11/04/23 Nustep 5 minutes for dynamic warm up and conditioning level 5, seat 7 Hamstring curl machine 30# 2 x 10  Leg press machine 10# 2 x 10  Knee extension 10# 2 x 10 Hip adduction 40# 3 x 15 Hip abduction 40# 3 x 15  PATIENT EDUCATION:  Education details: exercise rationale and technique   Person educated: Patient Education method: Explanation, Demonstration, Tactile cues, Verbal cues, Education comprehension: verbalized understanding, returned demonstration, verbal cues required, tactile cues required, and needs further education   HOME EXERCISE PROGRAM: Access Code: [email or web address]   URL: [email or web address] Date: 06/28/2023 - Seated March with Resistance  - 1 x daily - 7 x weekly - 3 sets - 15 reps - Seated Hip Abduction with Resistance  - 1 x daily - 7 x weekly - 3 sets - 15 reps - Seated Long Arc Quad  - 1 x daily - 7 x weekly - 3 sets - 15 reps - Shoulder External Rotation and Scapular Retraction with Resistance  - 1 x daily - 7 x weekly - 2 sets - 10 reps - Seated Shoulder Horizontal Abduction with Resistance  - 1 x daily - 7 x weekly - 2 sets - 10 reps  08/11/23 - Seated Punches  - 1 x daily - 7 x weekly - 3 sets - 10 reps - Seated Shoulder Overhead Press with Dumbbells with PLB  - 1 x daily - 7 x weekly - 3 sets - 10 reps - Seated Bicep Curls Supinated with Dumbbells  - 1 x daily - 7 x weekly - 3 sets - 10 reps - Seated Shoulder Abduction with Dumbbells - Thumbs Up  - 1 x daily - 7 x weekly - 3 sets - 10 reps  ASSESSMENT:  CLINICAL IMPRESSION: Patient has met 2/3 short term goals and 1/4 long term goals with ability to complete HEP and improvement in strength and balance. Remaining goals not met due  to continued  deficits in symptoms, strength, ROM, activity tolerance, gait, balance, and functional mobility. Patient has made good progress toward remaining goals but symptoms do fluctuate visit to visit. Patient continues to require skilled care for safety, balance and to reduce risk of fall/injury as she requires cueing frequently with fluctuating symptoms and remains at a high risk for falls. Extending POC with hopes of transition to gym routine with trainer at end of next POC as patient continues to need assist and has been having fluctuating symptoms affecting performance requiring skilled care to manage. Patient will continue to benefit from skilled physical therapy in order to improve function and reduce impairment.    OBJECTIVE IMPAIRMENTS Abnormal gait, decreased activity tolerance, decreased balance, decreased mobility, difficulty walking, decreased strength, impaired tone, and pain.   ACTIVITY LIMITATIONS carrying, lifting, bending, standing, squatting, stairs, transfers, bed mobility, dressing, and locomotion level  PARTICIPATION LIMITATIONS: meal prep, cleaning, laundry, driving, shopping, community activity, and yard work  PERSONAL FACTORS Fibromyalgia; headaches, LBBB; osteopenia  are also affecting patient's functional outcome.   REHAB POTENTIAL: Good  CLINICAL DECISION MAKING: Evolving/moderate complexity increasing pain in her quads   EVALUATION COMPLEXITY: Moderate   GOALS: Goals reviewed with patient? Yes  SHORT TERM GOALS: Target date: 04/18/2023  Patient will transfer sit to stand smoothly and independently  Baseline: Goal status: depends on the day 9/12  2.  Patient will increase gross strength by 5lbs  Baseline:  Goal status: MET  3.  Patient will improve BERG score by 10  Baseline:  Goal status: MET LONG TERM GOALS: Target date:POC  Patient will go improve balance to CGA for all balance exercises to improve safety  Baseline:  Goal status: INITIAL  2.   Patient will report a 50% reduction in falls  Baseline:  Goal status: INITIAL  3.  Patient will have a full balance program  Baseline:  Goal status: INITIAL 4. Patient will score at least 38/56 on Berg balance scale in order to demonstrate improved balance to reduce the risk for falls at home and in the community.  Baseline: 12/13/23 39/56 Goal status: MET   PLAN: PT FREQUENCY: 2x/week  PT DURATION: 6 weeks  PLANNED INTERVENTIONS: Therapeutic exercises, Therapeutic activity, Neuromuscular re-education, Balance training, Gait training, Patient/Family education, Self Care, Joint mobilization, Stair training, Aquatic Therapy, Cryotherapy, Moist heat, Taping, Manual therapy, and Re-evaluation  PLAN FOR NEXT SESSION: strength, balance training, transition to gym as able   Beather Liming, PT DPT 12/13/2023, 10:14 AM

## 2023-12-27 ENCOUNTER — Encounter (HOSPITAL_BASED_OUTPATIENT_CLINIC_OR_DEPARTMENT_OTHER): Payer: Self-pay | Admitting: Physical Therapy

## 2023-12-27 ENCOUNTER — Ambulatory Visit (HOSPITAL_BASED_OUTPATIENT_CLINIC_OR_DEPARTMENT_OTHER): Admitting: Physical Therapy

## 2023-12-27 DIAGNOSIS — G20A1 Parkinson's disease without dyskinesia, without mention of fluctuations: Secondary | ICD-10-CM | POA: Diagnosis not present

## 2023-12-27 DIAGNOSIS — R2689 Other abnormalities of gait and mobility: Secondary | ICD-10-CM

## 2023-12-27 NOTE — Therapy (Signed)
 OUTPATIENT PHYSICAL THERAPY LOWER EXTREMITY TREATMENT   Patient Name: Morgan [name] MRN: [id number] DOB:1951/02/24, 73 y.o., female Today's Date: 12/27/2023    PT End of Session - 12/27/23 1157     Visit Number 51    Number of Visits 84    Date for PT Re-Evaluation 01/24/24    Progress Note Due on Visit 60    PT Start Time 1147    PT Stop Time 1227    PT Time Calculation (min) 40 min    Activity Tolerance Patient tolerated treatment well    Behavior During Therapy WFL for tasks assessed/performed                    Past Medical History:  Diagnosis Date   Anxiety    Fibromyalgia    Headache    Hypercholesteremia    controlled on medication   Hyperlipidemia    Hypertension    no current meds   LBBB (left bundle branch block)    Melanoma (HCC)    MVP (mitral valve prolapse)    Osteopenia    Parkinson disease (HCC)    Pneumonia    SVD (spontaneous vaginal delivery)    x 2   Past Surgical History:  Procedure Laterality Date   BLEPHAROPLASTY     cold knife conization     COLONOSCOPY     FH colon CA   CYSTOSCOPY W/ URETERAL STENT PLACEMENT Left 10/18/2021   Procedure: CYSTOSCOPY WITH RETROGRADE PYELOGRAM, URETERAL STENT PLACEMENT;  Surgeon: Christina Coyer, MD;  Location: [facility];  Service: Urology;  Laterality: Left;   DILATATION & CURETTAGE/HYSTEROSCOPY WITH MYOSURE N/A 02/14/2018   Procedure: ATTEMPTED DILATATION & CURETTAGE/HYSTEROSCOPY WITH MYOSURE;  Surgeon: Arlee Lace, MD;  Location: [facility];  Service: Gynecology;  Laterality: N/A;  polyp   melanoma cancer     removed from left leg   RECONSTRUCTION OF EYELID     TONSILLECTOMY     TUBAL LIGATION     URETEROSCOPY WITH HOLMIUM LASER LITHOTRIPSY Left 12/17/2021   Procedure: URETEROSCOPY/HOLMIUM LASER/STENT EXCHANGE;  Surgeon: Christina Coyer, MD;  Location: [facility];  Service: Urology;  Laterality: Left;   WISDOM TOOTH EXTRACTION     Patient Active Problem List   Diagnosis Date Noted   COVID-19  virus infection 05/16/2022   HTN (hypertension) 10/20/2021   Physical deconditioning 10/20/2021   Hydronephrosis with infection 10/18/2021   Bacteremia due to Escherichia coli 10/18/2021   Severe sepsis (HCC) 10/17/2021   UTI (urinary tract infection) 10/17/2021   Acute metabolic encephalopathy 10/17/2021   Parkinson disease (HCC) 10/17/2021   Cervical stenosis (uterine cervix) 02/14/2018   Postmenopausal bleeding 02/14/2018   LBBB (left bundle branch block) 12/25/2015   PVCs (premature ventricular contractions) 12/25/2015   Hyperlipidemia 12/25/2015   Fibromyalgia 01/16/2014   Cholelithiasis 04/25/2013   Paralysis agitans (HCC) 01/15/2013   Hereditary and idiopathic peripheral neuropathy 01/15/2013     Faustina Hood MD PCP:   Waunita Haff PROVIDER: Ronnell Coins MD   REFERRING DIAG: Parkinsons   THERAPY DIAG:  Other abnormalities of gait and mobility  Parkinson's disease without dyskinesia or fluctuating manifestations (HCC)  Rationale for Evaluation and Treatment Rehabilitation  ONSET DATE: Several years   SUBJECTIVE:   SUBJECTIVE STATEMENT: Patient states having a pretty good day today.    PERTINENT HISTORY: Fibromyalgia; headaches, LBBB; osteopenia   PAIN:  Are you having pain? no: NPRS scale: 0/10 Location: Pain description:    Aggravating factors: just comes and goes  Relieving factors: just  comes and goes; sometimes ibuprofen  works but she is limited as to how much she can take  PRECAUTIONS: Patient reports that because of her heart she will pass out at times.   WEIGHT BEARING RESTRICTIONS No  FALLS:  Has patient fallen in last 6 months? Yes.  6 falls but likley more. Last fall    LIVING ENVIRONMENT: Stairs to the second floor in her house but she dosen't do much  4 steps in the front  2 steps in the garage  OCCUPATION: retired   Presenter, broadcasting: reading; goes to an exercise class   PLOF: Independent with household mobility with device  PATIENT GOALS   To have less pain in her legs and to move better    OBJECTIVE:  (measurements taken at evaluation unless otherwise noted)   DIAGNOSTIC FINDINGS:   PATIENT SURVEYS:  FOTO    COGNITION:  Overall cognitive status: Within functional limits for tasks assessed     SENSATION: Denies paresthesias   POSTURE: No Significant postural limitations  PALPATION: No trigger points noted   LOWER EXTREMITY ROM:  Passive ROM Right eval Left eval  Hip flexion    Hip extension    Hip abduction    Hip adduction    Hip internal rotation    Hip external rotation    Knee flexion    Knee extension    Ankle dorsiflexion    Ankle plantarflexion    Ankle inversion    Ankle eversion     (Blank rows = not tested)  WNL  LOWER EXTREMITY MMT:  MMT Right eval Left eval Right  7/22 Left  7/2 Right  9/12 Left 9/12 Right 06/13/23 Left 06/13/23 Right 08/02/23 Left 08/02/23 Right 10/18/23 Left 10/18/23 Right 12/13/23 Left 12/13/23  Hip flexion 21.7 29.7 23.5 25.9 24.6 25.6 41.3 37.8 47 45.5 44.9 46 49.1 50.5  Hip extension                Hip abduction 27.6 18.5 19.1 8.7 21.2 14.4 29.9 29.5 35.5 35.3 25.7 35.5 35.3 41.9  Hip adduction                Hip internal rotation                Hip external rotation                Knee flexion                Knee extension 23.5 20.2 17.7 16.8 22.7 16.3 39.4 49.8 48.8 53.5 48 42.3 40 40.9  Ankle dorsiflexion                Ankle plantarflexion                Ankle inversion                Ankle eversion                 (Blank rows = not tested)  GAIT: Decreased hip flexion; leans to the left; right hip frop. Patient required CGA while walking and min a when she turned for balance. No change from IE   Functional tests:  Sit to stand: cuing to catch balance. Uses momentum to stand.    5x sit to stand Balance: 13 sec   9/12 5x sit to stand 13 sec Narrow base min a  Narrow base eyes closed min to mod a  Tandem stance Min a both ways.   Single  leg stance 5 sec bilateral     06/13/23: 285 feet with walking stick, CGA  for safety  Reassessment 08/02/23:  258 feet with walking stick, CGA  for safety 5xSTS: 13.5 seconds with poor eccentric control  Reassessment 10/18/23:  - deferred today due to PT injury 5xSTS: 13.36 seconds with poor eccentric control, posterior lean with transfer/standing  Reassessment 10/18/23:  - deferred today due to PT injury 5xSTS: 10.97 seconds with 3/4 STS each rep, - took 4 attempts for patient to complete that due to impaired initation poor eccentric control, posterior lean with transfer/standing  Reassessment 12/13/23:  - 272 feet with intermittent unsteadiness and cueing for breathing and step length when shuffling 5xSTS: 10.01 seconds without UE support with decreased eccentric control  BERG Balance Test          Date:      Sit to Stand 1 2 06/13/23: 2 08/02/23: 3 10/18/23: 3 11/22/23: 3 12/13/23: 3  Standing unsupported 3 3 3 3 3 3 3   Sitting with back unsupported but feet supported 1 3 4 4 4 4 4   Stand to sit  1 2 3 3 3 1 3   Transfers  2 1 2 2 3 2 3   Standing unsupported with eyes closed 3 3 3 3 3 3 3   Standing unsupported feet together 3 3 3 3 3 3 3   From standing position, reach forward with outstretched arm 1 2 3 3 3 3 3   From standing position, pick up object from floor 1 2 3 3 3 3 3   From standing position, turn and look behind over each shoulder 1 2 2 3 3 3 3   Turn 360 2 1 2 2 2 2 2   Standing unsupported, alternately place foot on step 1 1 2 2 2 2 2   Standing unsupported, one foot in front 1 1 2 2 2 2 2   Standing on one leg 0 0 2 1 2 1 2   Total:  21/56 26/56 34/56  33/56 36/56 35/56  39/56      TODAY'S TREATMENT: 12/27/23 Nustep level 5 seat 8,  10 minutes STS with overhead reach 2x10 Hamstring curl machine 30# 2 x 10  Knee extension 10# 2 x 10 Hip abduction 40# 3 x 15  12/12/23 Nustep level 5 seat 8,  5 minutes Reassessment Hurdle step over 2 x 10  forward and lateral   12/09/23 Nustep level 5 seat 8,  5 minutes Seated bicep curl and shoulder press 4# 2 x 10 Gait with cueing for step length 40 feet    12/06/23 Nustep level 5 seat 8,  5 minutes STS with overhead reach 2x10 Step up onto airex 2 x 10 Hurdle step over 2 x 10 forward and lateral Step taps 6 inch 2 x 30 Seated bicep curl and shoulder press 4# 2 x 10   12/04/23 There-ex:   Nu-step 5 min L3   There-act:  Sit to stand 3x3 with CGA for balance  Hurdle step over 2x10 each  fwd and lateral   Neuro -re -ed  Step onto air-ex 2x10  Ai-ex rock 2x10   There-ex  Bicpes curls with posture 2x10 4 lbs  Punches with posture 2x10 4 lbs  Press up 2x10 4 lbs   11/29/23 Nustep level 5 seat 8,  10 minutes with subjective and POC discussion with patient/spouse Seated bicep curl and shoulder press 4# 2 x 10 Seated punches 4# 3 x 10 Step taps 6 inch 2 x  30 STS with overhead reach 2x10 Seated tricep dips 2 x 10    11/25/23 Nustep level 5, seat 8, 5 minutes   11/22/23 Nustep level 5, seat 8, 10 minutes with subjective and POC discussion with patient/spouse Reassessment   11/18/23 Nustep level 5, seat 8, 5 minutes STS with overhead reach 3x10  Seated bicep curl and shoulder press 4# 2 x 10 Anterior stepping with overhead reach 2 x 10 Lateral stepping with overhead reach 2 x 10 Shoulder flexion 1# 2 x 10  Seated shoulder abduction 1# 2 x 10 LAQ 3# 2x 10  11/15/23 Nustep level 5, seat 8, 6 minutes Bicep curl and shoulder press 3# 2 x 10 Shoulder flexion 1# 2 x 10  Seated ab iso with ball 2x 10 x 5 second holds Seated row GTB 2 x 15 Seated shoulder extension GTB 2 x 15   11/11/23 Nustep 5 minutes for dynamic warm up and conditioning level 5, seat 7 Seated shoulder press 4# 3 x 10  Seated punches 4# 3 x 10 Gait with RW 1 x 40 feet, 1 x 100 feet; gait with walking stick 1 x 100 Seated ab iso with ball 10 x 5 second holds Anterior stepping with overhead reach 2 x  12 Lateral stepping with overhead reach 2 x 10  11/08/23 Seated shoulder press 3# 2 x 10  Seated LAQ 2 x 10 with 5 second holds Seated hip adduction isometric 10 x 10 second holds Seated hip abduction isometric 10 x 10 second holds Seated bicep curls 3# bar 2 x 20   11/04/23 Nustep 5 minutes for dynamic warm up and conditioning level 5, seat 7 Hamstring curl machine 30# 2 x 10  Leg press machine 10# 2 x 10  Knee extension 10# 2 x 10 Hip adduction 40# 3 x 15 Hip abduction 40# 3 x 15  PATIENT EDUCATION:  Education details: exercise rationale and technique   Person educated: Patient Education method: Explanation, Demonstration, Tactile cues, Verbal cues, Education comprehension: verbalized understanding, returned demonstration, verbal cues required, tactile cues required, and needs further education   HOME EXERCISE PROGRAM: Access Code: [email or web address]   URL: [email or web address] Date: 06/28/2023 - Seated March with Resistance  - 1 x daily - 7 x weekly - 3 sets - 15 reps - Seated Hip Abduction with Resistance  - 1 x daily - 7 x weekly - 3 sets - 15 reps - Seated Long Arc Quad  - 1 x daily - 7 x weekly - 3 sets - 15 reps - Shoulder External Rotation and Scapular Retraction with Resistance  - 1 x daily - 7 x weekly - 2 sets - 10 reps - Seated Shoulder Horizontal Abduction with Resistance  - 1 x daily - 7 x weekly - 2 sets - 10 reps  08/11/23 - Seated Punches  - 1 x daily - 7 x weekly - 3 sets - 10 reps - Seated Shoulder Overhead Press with Dumbbells with PLB  - 1 x daily - 7 x weekly - 3 sets - 10 reps - Seated Bicep Curls Supinated with Dumbbells  - 1 x daily - 7 x weekly - 3 sets - 10 reps - Seated Shoulder Abduction with Dumbbells - Thumbs Up  - 1 x daily - 7 x weekly - 3 sets - 10 reps  ASSESSMENT:  CLINICAL IMPRESSION: Patient given cueing for step length with ambulation as she has increase in shuffle with dual tasks/distractions. Patient doing well with STS today  with  intermittent cueing for full extension and forward reaching. Frequent cueing for control on machine strengthening exercises today. Patient will continue to benefit from PT to improve function and reduce impairment.    OBJECTIVE IMPAIRMENTS Abnormal gait, decreased activity tolerance, decreased balance, decreased mobility, difficulty walking, decreased strength, impaired tone, and pain.   ACTIVITY LIMITATIONS carrying, lifting, bending, standing, squatting, stairs, transfers, bed mobility, dressing, and locomotion level  PARTICIPATION LIMITATIONS: meal prep, cleaning, laundry, driving, shopping, community activity, and yard work  PERSONAL FACTORS Fibromyalgia; headaches, LBBB; osteopenia  are also affecting patient's functional outcome.   REHAB POTENTIAL: Good  CLINICAL DECISION MAKING: Evolving/moderate complexity increasing pain in her quads   EVALUATION COMPLEXITY: Moderate   GOALS: Goals reviewed with patient? Yes  SHORT TERM GOALS: Target date: 04/18/2023  Patient will transfer sit to stand smoothly and independently  Baseline: Goal status: depends on the day 9/12  2.  Patient will increase gross strength by 5lbs  Baseline:  Goal status: MET  3.  Patient will improve BERG score by 10  Baseline:  Goal status: MET LONG TERM GOALS: Target date:POC  Patient will go improve balance to CGA for all balance exercises to improve safety  Baseline:  Goal status: INITIAL  2.  Patient will report a 50% reduction in falls  Baseline:  Goal status: INITIAL  3.  Patient will have a full balance program  Baseline:  Goal status: INITIAL 4. Patient will score at least 38/56 on Berg balance scale in order to demonstrate improved balance to reduce the risk for falls at home and in the community.  Baseline: 12/13/23 39/56 Goal status: MET   PLAN: PT FREQUENCY: 2x/week  PT DURATION: 6 weeks  PLANNED INTERVENTIONS: Therapeutic exercises, Therapeutic activity, Neuromuscular  re-education, Balance training, Gait training, Patient/Family education, Self Care, Joint mobilization, Stair training, Aquatic Therapy, Cryotherapy, Moist heat, Taping, Manual therapy, and Re-evaluation  PLAN FOR NEXT SESSION: strength, balance training, transition to gym as able   Beather Liming, PT DPT 12/27/2023, 12:01 PM

## 2024-01-02 DIAGNOSIS — H5213 Myopia, bilateral: Secondary | ICD-10-CM | POA: Diagnosis not present

## 2024-01-02 DIAGNOSIS — H353133 Nonexudative age-related macular degeneration, bilateral, advanced atrophic without subfoveal involvement: Secondary | ICD-10-CM | POA: Diagnosis not present

## 2024-01-02 DIAGNOSIS — H26493 Other secondary cataract, bilateral: Secondary | ICD-10-CM | POA: Diagnosis not present

## 2024-01-03 ENCOUNTER — Ambulatory Visit (HOSPITAL_BASED_OUTPATIENT_CLINIC_OR_DEPARTMENT_OTHER): Attending: Family Medicine | Admitting: Physical Therapy

## 2024-01-03 ENCOUNTER — Encounter (HOSPITAL_BASED_OUTPATIENT_CLINIC_OR_DEPARTMENT_OTHER): Payer: Self-pay | Admitting: Physical Therapy

## 2024-01-03 DIAGNOSIS — R2689 Other abnormalities of gait and mobility: Secondary | ICD-10-CM | POA: Insufficient documentation

## 2024-01-03 DIAGNOSIS — G20A1 Parkinson's disease without dyskinesia, without mention of fluctuations: Secondary | ICD-10-CM | POA: Diagnosis not present

## 2024-01-03 NOTE — Therapy (Signed)
 OUTPATIENT PHYSICAL THERAPY LOWER EXTREMITY TREATMENT   Patient Name: Morgan [name] MRN: [id number] DOB:06/08/1951, 73 y.o., female Today's Date: 01/03/2024    PT End of Session - 01/03/24 1110     Visit Number 52    Number of Visits 84    Date for PT Re-Evaluation 01/24/24    Progress Note Due on Visit 60    PT Start Time 1103    PT Stop Time 1143    PT Time Calculation (min) 40 min    Activity Tolerance Patient tolerated treatment well    Behavior During Therapy WFL for tasks assessed/performed                    Past Medical History:  Diagnosis Date   Anxiety    Fibromyalgia    Headache    Hypercholesteremia    controlled on medication   Hyperlipidemia    Hypertension    no current meds   LBBB (left bundle branch block)    Melanoma (HCC)    MVP (mitral valve prolapse)    Osteopenia    Parkinson disease (HCC)    Pneumonia    SVD (spontaneous vaginal delivery)    x 2   Past Surgical History:  Procedure Laterality Date   BLEPHAROPLASTY     cold knife conization     COLONOSCOPY     FH colon CA   CYSTOSCOPY W/ URETERAL STENT PLACEMENT Left 10/18/2021   Procedure: CYSTOSCOPY WITH RETROGRADE PYELOGRAM, URETERAL STENT PLACEMENT;  Surgeon: Christina Coyer, MD;  Location: [facility];  Service: Urology;  Laterality: Left;   DILATATION & CURETTAGE/HYSTEROSCOPY WITH MYOSURE N/A 02/14/2018   Procedure: ATTEMPTED DILATATION & CURETTAGE/HYSTEROSCOPY WITH MYOSURE;  Surgeon: Arlee Lace, MD;  Location: [facility];  Service: Gynecology;  Laterality: N/A;  polyp   melanoma cancer     removed from left leg   RECONSTRUCTION OF EYELID     TONSILLECTOMY     TUBAL LIGATION     URETEROSCOPY WITH HOLMIUM LASER LITHOTRIPSY Left 12/17/2021   Procedure: URETEROSCOPY/HOLMIUM LASER/STENT EXCHANGE;  Surgeon: Christina Coyer, MD;  Location: [facility];  Service: Urology;  Laterality: Left;   WISDOM TOOTH EXTRACTION     Patient Active Problem List   Diagnosis Date Noted   COVID-19  virus infection 05/16/2022   HTN (hypertension) 10/20/2021   Physical deconditioning 10/20/2021   Hydronephrosis with infection 10/18/2021   Bacteremia due to Escherichia coli 10/18/2021   Severe sepsis (HCC) 10/17/2021   UTI (urinary tract infection) 10/17/2021   Acute metabolic encephalopathy 10/17/2021   Parkinson disease (HCC) 10/17/2021   Cervical stenosis (uterine cervix) 02/14/2018   Postmenopausal bleeding 02/14/2018   LBBB (left bundle branch block) 12/25/2015   PVCs (premature ventricular contractions) 12/25/2015   Hyperlipidemia 12/25/2015   Fibromyalgia 01/16/2014   Cholelithiasis 04/25/2013   Paralysis agitans (HCC) 01/15/2013   Hereditary and idiopathic peripheral neuropathy 01/15/2013     Faustina Hood MD PCP:   Waunita Haff PROVIDER: Ronnell Coins MD   REFERRING DIAG: Parkinsons   THERAPY DIAG:  Other abnormalities of gait and mobility  Parkinson's disease without dyskinesia or fluctuating manifestations (HCC)  Rationale for Evaluation and Treatment Rehabilitation  ONSET DATE: Several years   SUBJECTIVE:   SUBJECTIVE STATEMENT: Patient states having a pretty good day today.    PERTINENT HISTORY: Fibromyalgia; headaches, LBBB; osteopenia   PAIN:  Are you having pain? no: NPRS scale: 0/10 Location: Pain description:    Aggravating factors: just comes and goes  Relieving factors: just  comes and goes; sometimes ibuprofen  works but she is limited as to how much she can take  PRECAUTIONS: Patient reports that because of her heart she will pass out at times.   WEIGHT BEARING RESTRICTIONS No  FALLS:  Has patient fallen in last 6 months? Yes.  6 falls but likley more. Last fall    LIVING ENVIRONMENT: Stairs to the second floor in her house but she dosen't do much  4 steps in the front  2 steps in the garage  OCCUPATION: retired   Presenter, broadcasting: reading; goes to an exercise class   PLOF: Independent with household mobility with device  PATIENT GOALS   To have less pain in her legs and to move better    OBJECTIVE:  (measurements taken at evaluation unless otherwise noted)   DIAGNOSTIC FINDINGS:   PATIENT SURVEYS:  FOTO    COGNITION:  Overall cognitive status: Within functional limits for tasks assessed     SENSATION: Denies paresthesias   POSTURE: No Significant postural limitations  PALPATION: No trigger points noted   LOWER EXTREMITY ROM:  Passive ROM Right eval Left eval  Hip flexion    Hip extension    Hip abduction    Hip adduction    Hip internal rotation    Hip external rotation    Knee flexion    Knee extension    Ankle dorsiflexion    Ankle plantarflexion    Ankle inversion    Ankle eversion     (Blank rows = not tested)  WNL  LOWER EXTREMITY MMT:  MMT Right eval Left eval Right  7/22 Left  7/2 Right  9/12 Left 9/12 Right 06/13/23 Left 06/13/23 Right 08/02/23 Left 08/02/23 Right 10/18/23 Left 10/18/23 Right 12/13/23 Left 12/13/23  Hip flexion 21.7 29.7 23.5 25.9 24.6 25.6 41.3 37.8 47 45.5 44.9 46 49.1 50.5  Hip extension                Hip abduction 27.6 18.5 19.1 8.7 21.2 14.4 29.9 29.5 35.5 35.3 25.7 35.5 35.3 41.9  Hip adduction                Hip internal rotation                Hip external rotation                Knee flexion                Knee extension 23.5 20.2 17.7 16.8 22.7 16.3 39.4 49.8 48.8 53.5 48 42.3 40 40.9  Ankle dorsiflexion                Ankle plantarflexion                Ankle inversion                Ankle eversion                 (Blank rows = not tested)  GAIT: Decreased hip flexion; leans to the left; right hip frop. Patient required CGA while walking and min a when she turned for balance. No change from IE   Functional tests:  Sit to stand: cuing to catch balance. Uses momentum to stand.    5x sit to stand Balance: 13 sec   9/12 5x sit to stand 13 sec Narrow base min a  Narrow base eyes closed min to mod a  Tandem stance Min a both ways.   Single  leg stance 5 sec bilateral     06/13/23: 285 feet with walking stick, CGA  for safety  Reassessment 08/02/23:  258 feet with walking stick, CGA  for safety 5xSTS: 13.5 seconds with poor eccentric control  Reassessment 10/18/23:  - deferred today due to PT injury 5xSTS: 13.36 seconds with poor eccentric control, posterior lean with transfer/standing  Reassessment 10/18/23:  - deferred today due to PT injury 5xSTS: 10.97 seconds with 3/4 STS each rep, - took 4 attempts for patient to complete that due to impaired initation poor eccentric control, posterior lean with transfer/standing  Reassessment 12/13/23:  - 272 feet with intermittent unsteadiness and cueing for breathing and step length when shuffling 5xSTS: 10.01 seconds without UE support with decreased eccentric control  BERG Balance Test          Date:      Sit to Stand 1 2 06/13/23: 2 08/02/23: 3 10/18/23: 3 11/22/23: 3 12/13/23: 3  Standing unsupported 3 3 3 3 3 3 3   Sitting with back unsupported but feet supported 1 3 4 4 4 4 4   Stand to sit  1 2 3 3 3 1 3   Transfers  2 1 2 2 3 2 3   Standing unsupported with eyes closed 3 3 3 3 3 3 3   Standing unsupported feet together 3 3 3 3 3 3 3   From standing position, reach forward with outstretched arm 1 2 3 3 3 3 3   From standing position, pick up object from floor 1 2 3 3 3 3 3   From standing position, turn and look behind over each shoulder 1 2 2 3 3 3 3   Turn 360 2 1 2 2 2 2 2   Standing unsupported, alternately place foot on step 1 1 2 2 2 2 2   Standing unsupported, one foot in front 1 1 2 2 2 2 2   Standing on one leg 0 0 2 1 2 1 2   Total:  21/56 26/56 34/56  33/56 36/56 35/56  39/56      TODAY'S TREATMENT: 01/03/24 Nustep level 5 seat 8,  6 minutes STS with overhead reach 2x10 Hip adduction machine 55# 3 x 15 Hamstring curl machine 40# 3 x 10  Knee extension 10# 3 x 10  12/27/23 Nustep level 5 seat 8,  10 minutes STS with overhead reach  2x10 Hamstring curl machine 30# 2 x 10  Knee extension 10# 2 x 10 Hip abduction 40# 3 x 15  12/12/23 Nustep level 5 seat 8,  5 minutes Reassessment Hurdle step over 2 x 10 forward and lateral   12/09/23 Nustep level 5 seat 8,  5 minutes Seated bicep curl and shoulder press 4# 2 x 10 Gait with cueing for step length 40 feet    12/06/23 Nustep level 5 seat 8,  5 minutes STS with overhead reach 2x10 Step up onto airex 2 x 10 Hurdle step over 2 x 10 forward and lateral Step taps 6 inch 2 x 30 Seated bicep curl and shoulder press 4# 2 x 10   12/04/23 There-ex:   Nu-step 5 min L3   There-act:  Sit to stand 3x3 with CGA for balance  Hurdle step over 2x10 each  fwd and lateral   Neuro -re -ed  Step onto air-ex 2x10  Ai-ex rock 2x10   There-ex  Bicpes curls with posture 2x10 4 lbs  Punches with posture 2x10 4 lbs  Press up 2x10 4 lbs   11/29/23 Nustep  level 5 seat 8,  10 minutes with subjective and POC discussion with patient/spouse Seated bicep curl and shoulder press 4# 2 x 10 Seated punches 4# 3 x 10 Step taps 6 inch 2 x 30 STS with overhead reach 2x10 Seated tricep dips 2 x 10    11/25/23 Nustep level 5, seat 8, 5 minutes   11/22/23 Nustep level 5, seat 8, 10 minutes with subjective and POC discussion with patient/spouse Reassessment   11/18/23 Nustep level 5, seat 8, 5 minutes STS with overhead reach 3x10  Seated bicep curl and shoulder press 4# 2 x 10 Anterior stepping with overhead reach 2 x 10 Lateral stepping with overhead reach 2 x 10 Shoulder flexion 1# 2 x 10  Seated shoulder abduction 1# 2 x 10 LAQ 3# 2x 10  11/15/23 Nustep level 5, seat 8, 6 minutes Bicep curl and shoulder press 3# 2 x 10 Shoulder flexion 1# 2 x 10  Seated ab iso with ball 2x 10 x 5 second holds Seated row GTB 2 x 15 Seated shoulder extension GTB 2 x 15   11/11/23 Nustep 5 minutes for dynamic warm up and conditioning level 5, seat 7 Seated shoulder press 4# 3 x 10  Seated  punches 4# 3 x 10 Gait with RW 1 x 40 feet, 1 x 100 feet; gait with walking stick 1 x 100 Seated ab iso with ball 10 x 5 second holds Anterior stepping with overhead reach 2 x 12 Lateral stepping with overhead reach 2 x 10  11/08/23 Seated shoulder press 3# 2 x 10  Seated LAQ 2 x 10 with 5 second holds Seated hip adduction isometric 10 x 10 second holds Seated hip abduction isometric 10 x 10 second holds Seated bicep curls 3# bar 2 x 20   11/04/23 Nustep 5 minutes for dynamic warm up and conditioning level 5, seat 7 Hamstring curl machine 30# 2 x 10  Leg press machine 10# 2 x 10  Knee extension 10# 2 x 10 Hip adduction 40# 3 x 15 Hip abduction 40# 3 x 15  PATIENT EDUCATION:  Education details: exercise rationale and technique   Person educated: Patient Education method: Explanation, Demonstration, Tactile cues, Verbal cues, Education comprehension: verbalized understanding, returned demonstration, verbal cues required, tactile cues required, and needs further education   HOME EXERCISE PROGRAM: Access Code: [email or web address]   URL: [email or web address] Date: 06/28/2023 - Seated March with Resistance  - 1 x daily - 7 x weekly - 3 sets - 15 reps - Seated Hip Abduction with Resistance  - 1 x daily - 7 x weekly - 3 sets - 15 reps - Seated Long Arc Quad  - 1 x daily - 7 x weekly - 3 sets - 15 reps - Shoulder External Rotation and Scapular Retraction with Resistance  - 1 x daily - 7 x weekly - 2 sets - 10 reps - Seated Shoulder Horizontal Abduction with Resistance  - 1 x daily - 7 x weekly - 2 sets - 10 reps  08/11/23 - Seated Punches  - 1 x daily - 7 x weekly - 3 sets - 10 reps - Seated Shoulder Overhead Press with Dumbbells with PLB  - 1 x daily - 7 x weekly - 3 sets - 10 reps - Seated Bicep Curls Supinated with Dumbbells  - 1 x daily - 7 x weekly - 3 sets - 10 reps - Seated Shoulder Abduction with Dumbbells - Thumbs Up  - 1 x daily - 7 x weekly -  3 sets - 10  reps  ASSESSMENT:  CLINICAL IMPRESSION: Continued cueing for step length provided PRN. Performed functional strength for warm up following nustep and progressive strengthening on machines today. Patient will continue to benefit from PT to improve function and reduce impairment.    OBJECTIVE IMPAIRMENTS Abnormal gait, decreased activity tolerance, decreased balance, decreased mobility, difficulty walking, decreased strength, impaired tone, and pain.   ACTIVITY LIMITATIONS carrying, lifting, bending, standing, squatting, stairs, transfers, bed mobility, dressing, and locomotion level  PARTICIPATION LIMITATIONS: meal prep, cleaning, laundry, driving, shopping, community activity, and yard work  PERSONAL FACTORS Fibromyalgia; headaches, LBBB; osteopenia  are also affecting patient's functional outcome.   REHAB POTENTIAL: Good  CLINICAL DECISION MAKING: Evolving/moderate complexity increasing pain in her quads   EVALUATION COMPLEXITY: Moderate   GOALS: Goals reviewed with patient? Yes  SHORT TERM GOALS: Target date: 04/18/2023  Patient will transfer sit to stand smoothly and independently  Baseline: Goal status: depends on the day 9/12  2.  Patient will increase gross strength by 5lbs  Baseline:  Goal status: MET  3.  Patient will improve BERG score by 10  Baseline:  Goal status: MET LONG TERM GOALS: Target date:POC  Patient will go improve balance to CGA for all balance exercises to improve safety  Baseline:  Goal status: INITIAL  2.  Patient will report a 50% reduction in falls  Baseline:  Goal status: INITIAL  3.  Patient will have a full balance program  Baseline:  Goal status: INITIAL 4. Patient will score at least 38/56 on Berg balance scale in order to demonstrate improved balance to reduce the risk for falls at home and in the community.  Baseline: 12/13/23 39/56 Goal status: MET   PLAN: PT FREQUENCY: 2x/week  PT DURATION: 6 weeks  PLANNED INTERVENTIONS:  Therapeutic exercises, Therapeutic activity, Neuromuscular re-education, Balance training, Gait training, Patient/Family education, Self Care, Joint mobilization, Stair training, Aquatic Therapy, Cryotherapy, Moist heat, Taping, Manual therapy, and Re-evaluation  PLAN FOR NEXT SESSION: strength, balance training, transition to gym as able   Beather Liming, PT DPT 01/03/2024, 11:48 AM

## 2024-01-04 DIAGNOSIS — E785 Hyperlipidemia, unspecified: Secondary | ICD-10-CM | POA: Diagnosis not present

## 2024-01-06 ENCOUNTER — Encounter (HOSPITAL_BASED_OUTPATIENT_CLINIC_OR_DEPARTMENT_OTHER): Payer: Self-pay | Admitting: Physical Therapy

## 2024-01-06 ENCOUNTER — Ambulatory Visit (HOSPITAL_BASED_OUTPATIENT_CLINIC_OR_DEPARTMENT_OTHER): Admitting: Physical Therapy

## 2024-01-06 DIAGNOSIS — R2689 Other abnormalities of gait and mobility: Secondary | ICD-10-CM

## 2024-01-06 DIAGNOSIS — G20A1 Parkinson's disease without dyskinesia, without mention of fluctuations: Secondary | ICD-10-CM | POA: Diagnosis not present

## 2024-01-06 NOTE — Therapy (Signed)
 OUTPATIENT PHYSICAL THERAPY LOWER EXTREMITY TREATMENT   Patient Name: Morgan [name] MRN: [id number] DOB:[birth year], 73 y.o., female Today's Date: 01/06/2024    PT End of Session - 01/06/24 1056     Visit Number 53    Number of Visits 84    Date for PT Re-Evaluation 01/24/24    Progress Note Due on Visit 60    PT Start Time 1045    PT Stop Time 1125    PT Time Calculation (min) 40 min    Activity Tolerance Patient tolerated treatment well    Behavior During Therapy WFL for tasks assessed/performed                    Past Medical History:  Diagnosis Date   Anxiety    Fibromyalgia    Headache    Hypercholesteremia    controlled on medication   Hyperlipidemia    Hypertension    no current meds   LBBB (left bundle branch block)    Melanoma (HCC)    MVP (mitral valve prolapse)    Osteopenia    Parkinson disease (HCC)    Pneumonia    SVD (spontaneous vaginal delivery)    x 2   Past Surgical History:  Procedure Laterality Date   BLEPHAROPLASTY     cold knife conization     COLONOSCOPY     FH colon CA   CYSTOSCOPY W/ URETERAL STENT PLACEMENT Left 10/18/2021   Procedure: CYSTOSCOPY WITH RETROGRADE PYELOGRAM, URETERAL STENT PLACEMENT;  Surgeon: Christina Coyer, MD;  Location: [facility];  Service: Urology;  Laterality: Left;   DILATATION & CURETTAGE/HYSTEROSCOPY WITH MYOSURE N/A 02/14/2018   Procedure: ATTEMPTED DILATATION & CURETTAGE/HYSTEROSCOPY WITH MYOSURE;  Surgeon: Arlee Lace, MD;  Location: [facility];  Service: Gynecology;  Laterality: N/A;  polyp   melanoma cancer     removed from left leg   RECONSTRUCTION OF EYELID     TONSILLECTOMY     TUBAL LIGATION     URETEROSCOPY WITH HOLMIUM LASER LITHOTRIPSY Left 12/17/2021   Procedure: URETEROSCOPY/HOLMIUM LASER/STENT EXCHANGE;  Surgeon: Christina Coyer, MD;  Location: [facility];  Service: Urology;  Laterality: Left;   WISDOM TOOTH EXTRACTION     Patient Active Problem List   Diagnosis Date Noted   COVID-19  virus infection 05/16/2022   HTN (hypertension) 10/20/2021   Physical deconditioning 10/20/2021   Hydronephrosis with infection 10/18/2021   Bacteremia due to Escherichia coli 10/18/2021   Severe sepsis (HCC) 10/17/2021   UTI (urinary tract infection) 10/17/2021   Acute metabolic encephalopathy 10/17/2021   Parkinson disease (HCC) 10/17/2021   Cervical stenosis (uterine cervix) 02/14/2018   Postmenopausal bleeding 02/14/2018   LBBB (left bundle branch block) 12/25/2015   PVCs (premature ventricular contractions) 12/25/2015   Hyperlipidemia 12/25/2015   Fibromyalgia 01/16/2014   Cholelithiasis 04/25/2013   Paralysis agitans (HCC) 01/15/2013   Hereditary and idiopathic peripheral neuropathy 01/15/2013     Faustina Hood MD PCP:   Waunita Haff PROVIDER: Ronnell Coins MD   REFERRING DIAG: Parkinsons   THERAPY DIAG:  Other abnormalities of gait and mobility  Parkinson's disease without dyskinesia or fluctuating manifestations (HCC)  Rationale for Evaluation and Treatment Rehabilitation  ONSET DATE: Several years   SUBJECTIVE:   SUBJECTIVE STATEMENT: Patient states having a pretty good day today.    PERTINENT HISTORY: Fibromyalgia; headaches, LBBB; osteopenia   PAIN:  Are you having pain? no: NPRS scale: 0/10 Location: Pain description:    Aggravating factors: just comes and goes  Relieving factors: just  comes and goes; sometimes ibuprofen  works but she is limited as to how much she can take  PRECAUTIONS: Patient reports that because of her heart she will pass out at times.   WEIGHT BEARING RESTRICTIONS No  FALLS:  Has patient fallen in last 6 months? Yes.  6 falls but likley more. Last fall    LIVING ENVIRONMENT: Stairs to the second floor in her house but she dosen't do much  4 steps in the front  2 steps in the garage  OCCUPATION: retired   Presenter, broadcasting: reading; goes to an exercise class   PLOF: Independent with household mobility with device  PATIENT GOALS   To have less pain in her legs and to move better    OBJECTIVE:  (measurements taken at evaluation unless otherwise noted)   DIAGNOSTIC FINDINGS:   PATIENT SURVEYS:  FOTO    COGNITION:  Overall cognitive status: Within functional limits for tasks assessed     SENSATION: Denies paresthesias   POSTURE: No Significant postural limitations  PALPATION: No trigger points noted   LOWER EXTREMITY ROM:  Passive ROM Right eval Left eval  Hip flexion    Hip extension    Hip abduction    Hip adduction    Hip internal rotation    Hip external rotation    Knee flexion    Knee extension    Ankle dorsiflexion    Ankle plantarflexion    Ankle inversion    Ankle eversion     (Blank rows = not tested)  WNL  LOWER EXTREMITY MMT:  MMT Right eval Left eval Right  7/22 Left  7/2 Right  9/12 Left 9/12 Right 06/13/23 Left 06/13/23 Right 08/02/23 Left 08/02/23 Right 10/18/23 Left 10/18/23 Right 12/13/23 Left 12/13/23  Hip flexion 21.7 29.7 23.5 25.9 24.6 25.6 41.3 37.8 47 45.5 44.9 46 49.1 50.5  Hip extension                Hip abduction 27.6 18.5 19.1 8.7 21.2 14.4 29.9 29.5 35.5 35.3 25.7 35.5 35.3 41.9  Hip adduction                Hip internal rotation                Hip external rotation                Knee flexion                Knee extension 23.5 20.2 17.7 16.8 22.7 16.3 39.4 49.8 48.8 53.5 48 42.3 40 40.9  Ankle dorsiflexion                Ankle plantarflexion                Ankle inversion                Ankle eversion                 (Blank rows = not tested)  GAIT: Decreased hip flexion; leans to the left; right hip frop. Patient required CGA while walking and min a when she turned for balance. No change from IE   Functional tests:  Sit to stand: cuing to catch balance. Uses momentum to stand.    5x sit to stand Balance: 13 sec   9/12 5x sit to stand 13 sec Narrow base min a  Narrow base eyes closed min to mod a  Tandem stance Min a both ways.   Single  leg stance 5 sec bilateral     06/13/23: 285 feet with walking stick, CGA  for safety  Reassessment 08/02/23:  258 feet with walking stick, CGA  for safety 5xSTS: 13.5 seconds with poor eccentric control  Reassessment 10/18/23:  - deferred today due to PT injury 5xSTS: 13.36 seconds with poor eccentric control, posterior lean with transfer/standing  Reassessment 10/18/23:  - deferred today due to PT injury 5xSTS: 10.97 seconds with 3/4 STS each rep, - took 4 attempts for patient to complete that due to impaired initation poor eccentric control, posterior lean with transfer/standing  Reassessment 12/13/23:  - 272 feet with intermittent unsteadiness and cueing for breathing and step length when shuffling 5xSTS: 10.01 seconds without UE support with decreased eccentric control  BERG Balance Test          Date:      Sit to Stand 1 2 06/13/23: 2 08/02/23: 3 10/18/23: 3 11/22/23: 3 12/13/23: 3  Standing unsupported 3 3 3 3 3 3 3   Sitting with back unsupported but feet supported 1 3 4 4 4 4 4   Stand to sit  1 2 3 3 3 1 3   Transfers  2 1 2 2 3 2 3   Standing unsupported with eyes closed 3 3 3 3 3 3 3   Standing unsupported feet together 3 3 3 3 3 3 3   From standing position, reach forward with outstretched arm 1 2 3 3 3 3 3   From standing position, pick up object from floor 1 2 3 3 3 3 3   From standing position, turn and look behind over each shoulder 1 2 2 3 3 3 3   Turn 360 2 1 2 2 2 2 2   Standing unsupported, alternately place foot on step 1 1 2 2 2 2 2   Standing unsupported, one foot in front 1 1 2 2 2 2 2   Standing on one leg 0 0 2 1 2 1 2   Total:  21/56 26/56 34/56  33/56 36/56 35/56  39/56      TODAY'S TREATMENT: 01/06/24 Nustep level 5 seat 8,  6 minutes Seated bicep curl and shoulder press 4# 3x 10 Seated punches 4# 3 x 10 Step taps 6 inch 2 x 30 Step up 6 inch 2 x 10 STS with overhead reach 3x10  01/03/24 Nustep level 5 seat 8,  6 minutes STS with  overhead reach 2x10 Hip adduction machine 55# 3 x 15 Hamstring curl machine 40# 3 x 10  Knee extension 10# 3 x 10  12/27/23 Nustep level 5 seat 8,  10 minutes STS with overhead reach 2x10 Hamstring curl machine 30# 2 x 10  Knee extension 10# 2 x 10 Hip abduction 40# 3 x 15  12/12/23 Nustep level 5 seat 8,  5 minutes Reassessment Hurdle step over 2 x 10 forward and lateral   12/09/23 Nustep level 5 seat 8,  5 minutes Seated bicep curl and shoulder press 4# 2 x 10 Gait with cueing for step length 40 feet    12/06/23 Nustep level 5 seat 8,  5 minutes STS with overhead reach 2x10 Step up onto airex 2 x 10 Hurdle step over 2 x 10 forward and lateral Step taps 6 inch 2 x 30 Seated bicep curl and shoulder press 4# 2 x 10   12/04/23 There-ex:   Nu-step 5 min L3   There-act:  Sit to stand 3x3 with CGA for balance  Hurdle step over 2x10 each  fwd  and lateral   Neuro -re -ed  Step onto air-ex 2x10  Ai-ex rock 2x10   There-ex  Bicpes curls with posture 2x10 4 lbs  Punches with posture 2x10 4 lbs  Press up 2x10 4 lbs   11/29/23 Nustep level 5 seat 8,  10 minutes with subjective and POC discussion with patient/spouse Seated bicep curl and shoulder press 4# 2 x 10 Seated punches 4# 3 x 10 Step taps 6 inch 2 x 30 STS with overhead reach 2x10 Seated tricep dips 2 x 10    11/25/23 Nustep level 5, seat 8, 5 minutes   11/22/23 Nustep level 5, seat 8, 10 minutes with subjective and POC discussion with patient/spouse Reassessment   11/18/23 Nustep level 5, seat 8, 5 minutes STS with overhead reach 3x10  Seated bicep curl and shoulder press 4# 2 x 10 Anterior stepping with overhead reach 2 x 10 Lateral stepping with overhead reach 2 x 10 Shoulder flexion 1# 2 x 10  Seated shoulder abduction 1# 2 x 10 LAQ 3# 2x 10  11/15/23 Nustep level 5, seat 8, 6 minutes Bicep curl and shoulder press 3# 2 x 10 Shoulder flexion 1# 2 x 10  Seated ab iso with ball 2x 10 x 5 second  holds Seated row GTB 2 x 15 Seated shoulder extension GTB 2 x 15   11/11/23 Nustep 5 minutes for dynamic warm up and conditioning level 5, seat 7 Seated shoulder press 4# 3 x 10  Seated punches 4# 3 x 10 Gait with RW 1 x 40 feet, 1 x 100 feet; gait with walking stick 1 x 100 Seated ab iso with ball 10 x 5 second holds Anterior stepping with overhead reach 2 x 12 Lateral stepping with overhead reach 2 x 10  11/08/23 Seated shoulder press 3# 2 x 10  Seated LAQ 2 x 10 with 5 second holds Seated hip adduction isometric 10 x 10 second holds Seated hip abduction isometric 10 x 10 second holds Seated bicep curls 3# bar 2 x 20   11/04/23 Nustep 5 minutes for dynamic warm up and conditioning level 5, seat 7 Hamstring curl machine 30# 2 x 10  Leg press machine 10# 2 x 10  Knee extension 10# 2 x 10 Hip adduction 40# 3 x 15 Hip abduction 40# 3 x 15  PATIENT EDUCATION:  Education details: exercise rationale and technique   Person educated: Patient Education method: Explanation, Demonstration, Tactile cues, Verbal cues, Education comprehension: verbalized understanding, returned demonstration, verbal cues required, tactile cues required, and needs further education   HOME EXERCISE PROGRAM: Access Code: [email or web address]   URL: [email or web address] Date: 06/28/2023 - Seated March with Resistance  - 1 x daily - 7 x weekly - 3 sets - 15 reps - Seated Hip Abduction with Resistance  - 1 x daily - 7 x weekly - 3 sets - 15 reps - Seated Long Arc Quad  - 1 x daily - 7 x weekly - 3 sets - 15 reps - Shoulder External Rotation and Scapular Retraction with Resistance  - 1 x daily - 7 x weekly - 2 sets - 10 reps - Seated Shoulder Horizontal Abduction with Resistance  - 1 x daily - 7 x weekly - 2 sets - 10 reps  08/11/23 - Seated Punches  - 1 x daily - 7 x weekly - 3 sets - 10 reps - Seated Shoulder Overhead Press with Dumbbells with PLB  - 1 x daily - 7 x weekly - 3  sets - 10 reps - Seated  Bicep Curls Supinated with Dumbbells  - 1 x daily - 7 x weekly - 3 sets - 10 reps - Seated Shoulder Abduction with Dumbbells - Thumbs Up  - 1 x daily - 7 x weekly - 3 sets - 10 reps  ASSESSMENT:  CLINICAL IMPRESSION: Nustep for dynamic warm up. Continued with strengthening exercises which are tolerated well. Frequent cueing provided for mechanics, sequencing, and positioning of exercises with good to fair carry over. Fatigue 6/10 at EOS.  Patient will continue to benefit from PT to improve function and reduce impairment.    OBJECTIVE IMPAIRMENTS Abnormal gait, decreased activity tolerance, decreased balance, decreased mobility, difficulty walking, decreased strength, impaired tone, and pain.   ACTIVITY LIMITATIONS carrying, lifting, bending, standing, squatting, stairs, transfers, bed mobility, dressing, and locomotion level  PARTICIPATION LIMITATIONS: meal prep, cleaning, laundry, driving, shopping, community activity, and yard work  PERSONAL FACTORS Fibromyalgia; headaches, LBBB; osteopenia  are also affecting patient's functional outcome.   REHAB POTENTIAL: Good  CLINICAL DECISION MAKING: Evolving/moderate complexity increasing pain in her quads   EVALUATION COMPLEXITY: Moderate   GOALS: Goals reviewed with patient? Yes  SHORT TERM GOALS: Target date: 04/18/2023  Patient will transfer sit to stand smoothly and independently  Baseline: Goal status: depends on the day 9/12  2.  Patient will increase gross strength by 5lbs  Baseline:  Goal status: MET  3.  Patient will improve BERG score by 10  Baseline:  Goal status: MET LONG TERM GOALS: Target date:POC  Patient will go improve balance to CGA for all balance exercises to improve safety  Baseline:  Goal status: INITIAL  2.  Patient will report a 50% reduction in falls  Baseline:  Goal status: INITIAL  3.  Patient will have a full balance program  Baseline:  Goal status: INITIAL 4. Patient will score at least 38/56 on  Berg balance scale in order to demonstrate improved balance to reduce the risk for falls at home and in the community.  Baseline: 12/13/23 39/56 Goal status: MET   PLAN: PT FREQUENCY: 2x/week  PT DURATION: 6 weeks  PLANNED INTERVENTIONS: Therapeutic exercises, Therapeutic activity, Neuromuscular re-education, Balance training, Gait training, Patient/Family education, Self Care, Joint mobilization, Stair training, Aquatic Therapy, Cryotherapy, Moist heat, Taping, Manual therapy, and Re-evaluation  PLAN FOR NEXT SESSION: strength, balance training, transition to gym as able   Beather Liming, PT DPT 01/06/2024, 10:57 AM

## 2024-01-08 ENCOUNTER — Ambulatory Visit: Attending: Cardiovascular Disease | Admitting: Cardiovascular Disease

## 2024-01-08 VITALS — BP 136/88 | HR 89 | Ht 64.0 in | Wt 158.6 lb

## 2024-01-08 DIAGNOSIS — I447 Left bundle-branch block, unspecified: Secondary | ICD-10-CM | POA: Diagnosis present

## 2024-01-08 DIAGNOSIS — I10 Essential (primary) hypertension: Secondary | ICD-10-CM

## 2024-01-08 DIAGNOSIS — R0989 Other specified symptoms and signs involving the circulatory and respiratory systems: Secondary | ICD-10-CM | POA: Diagnosis present

## 2024-01-08 DIAGNOSIS — G903 Multi-system degeneration of the autonomic nervous system: Secondary | ICD-10-CM | POA: Diagnosis present

## 2024-01-08 NOTE — Patient Instructions (Signed)
 Medication Instructions:  No changes *If you need a refill on your cardiac medications before your next appointment, please call your pharmacy*  Testing/Procedures: Your physician has requested that you have a carotid duplex. This test is an ultrasound of the carotid arteries in your neck. It looks at blood flow through these arteries that supply the brain with blood. Allow one hour for this exam. There are no restrictions or special instructions.   Follow-Up: At [facility], you and your health needs are our priority.  As part of our continuing mission to provide you with exceptional heart care, our providers are all part of one team.  This team includes your primary Cardiologist (physician) and Advanced Practice Providers or APPs (Physician Assistants and Nurse Practitioners) who all work together to provide you with the care you need, when you need it.  Your next appointment:   1 year(s)  Provider:   Luana Rumple, MD    We recommend signing up for the patient portal called "MyChart".  Sign up information is provided on this After Visit Summary.  MyChart is used to connect with patients for Virtual Visits (Telemedicine).  Patients are able to view lab/test results, encounter notes, upcoming appointments, etc.  Non-urgent messages can be sent to your provider as well.   To learn more about what you can do with MyChart, go to [email or web address].

## 2024-01-08 NOTE — Progress Notes (Unsigned)
 Patient ID: Morgan [name], female   DOB: Apr 29, 1951, 73 y.o.   MRN: [id number]    Cardiology Office Note    Date:  01/10/2024   ID:  Morgan, [name] 1951-06-21, MRN [id number]  PCP:  Morgan Hood, MD  Cardiologist:   Morgan Rumple, MD   No chief complaint on file.   History of Present Illness:  Morgan [name] is a 73 y.o. female with left bundle branch block and Parkinson's disease aortic atherosclerosis incidentally noted on imaging studies.  Her husband Morgan [name] is my patient and her mother-in-law Morgan [name] was also my patient.  Her biggest problem has been Parkinson's disease associated with both dyskinesia and restricted mobility.  She is in a wheelchair today.  She gets around with assistance at home.  She is unable to help with the housework.  Her neurologist is Dr. Tanuzzi and they are satisfied with the changes he is made to her medications.  She has had problems with orthostatic hypotension and we have stopped all her antihypertensive medications.  So far, she has not required treatment with midodrine  or other vasoconstrictors.  She still has some dizziness when she stands up at times, but she has not had falls or syncope.  She denies chest pain or shortness of breath either at rest or with activity and has not had palpitations, lower extremity edema, claudication, orthopnea, PND.  In a sitting position her blood pressure is at optimal range, just under 140/90  She does not have a history of structural heart disease but does have mild hypertension and hyperlipidemia both adequately treated with pharmacological means. Her most serious medical problem is Parkinson's disease which limits her mobility.    Past Medical History:  Diagnosis Date   Anxiety    Fibromyalgia    Headache    Hypercholesteremia    controlled on medication   Hyperlipidemia    Hypertension    no current meds   LBBB (left bundle branch block)    Melanoma (HCC)    MVP (mitral valve  prolapse)    Osteopenia    Parkinson disease (HCC)    Pneumonia    SVD (spontaneous vaginal delivery)    x 2    Past Surgical History:  Procedure Laterality Date   BLEPHAROPLASTY     cold knife conization     COLONOSCOPY     FH colon CA   CYSTOSCOPY W/ URETERAL STENT PLACEMENT Left 10/18/2021   Procedure: CYSTOSCOPY WITH RETROGRADE PYELOGRAM, URETERAL STENT PLACEMENT;  Surgeon: Christina Coyer, MD;  Location: [facility];  Service: Urology;  Laterality: Left;   DILATATION & CURETTAGE/HYSTEROSCOPY WITH MYOSURE N/A 02/14/2018   Procedure: ATTEMPTED DILATATION & CURETTAGE/HYSTEROSCOPY WITH MYOSURE;  Surgeon: Arlee Lace, MD;  Location: [facility];  Service: Gynecology;  Laterality: N/A;  polyp   melanoma cancer     removed from left leg   RECONSTRUCTION OF EYELID     TONSILLECTOMY     TUBAL LIGATION     URETEROSCOPY WITH HOLMIUM LASER LITHOTRIPSY Left 12/17/2021   Procedure: URETEROSCOPY/HOLMIUM LASER/STENT EXCHANGE;  Surgeon: Christina Coyer, MD;  Location: [facility];  Service: Urology;  Laterality: Left;   WISDOM TOOTH EXTRACTION      Current Medications: Outpatient Medications Prior to Visit  Medication Sig Dispense Refill   amLODipine  (NORVASC ) 2.5 MG tablet Take 1 tablet (2.5 mg total) by mouth daily. 30 tablet 0   aspirin  EC 81 MG tablet Take 81 mg by mouth daily. Swallow whole.     atorvastatin  (  LIPITOR) 10 MG tablet Take 10 mg by mouth in the morning.     BLINK TEARS 0.25 % SOLN Place 1 drop into both eyes 3 (three) times daily as needed (for dryness).     carbidopa -levodopa  (SINEMET  IR) 25-100 MG tablet 1 tablet every 2 hours starting at 6-7am (getting about 6-7 in per day)The medication have a score line so it can be broken. 210 tablet 0   Carbidopa -Levodopa  ER (SINEMET  CR) 25-100 MG tablet controlled release Take 1 tablet by mouth 3 (three) times daily.     Cephalexin  250 MG tablet Take 250 mg by mouth at bedtime.     Cholecalciferol (VITAMIN D3) 25 MCG (1000 UT) CHEW Chew 2,500  Units by mouth daily.     Coenzyme Q10-Vitamin E (QUNOL ULTRA COQ10 PO) Take 1 capsule by mouth daily.     CYMBALTA  60 MG capsule TAKE 1 CAPSULE EVERY       MORNING 90 capsule 0   Ibuprofen  200 MG CAPS Take 2 capsules (400 mg total) by mouth every 8 (eight) hours as needed (Leg pain, headache or mild pain).     magnesium oxide (MAG-OX) 400 MG tablet Take 400 mg by mouth at bedtime.     methocarbamol (ROBAXIN) 750 MG tablet Take 750 mg by mouth every 6 (six) hours as needed for muscle spasms (or leg pain).     midodrine  (PROAMATINE ) 2.5 MG tablet Take 1 tablet (2.5 mg total) by mouth 3 (three) times daily as needed (for systolic blood pressure less than 100). 90 tablet 11   Multiple Vitamins-Minerals (PRESERVISION AREDS 2 PO) Take 1 capsule by mouth 2 (two) times daily.     raloxifene  (EVISTA ) 60 MG tablet Take 60 mg by mouth in the morning.     TART CHERRY PO Take 3,000 mg by mouth daily.     Vibegron (GEMTESA) 75 MG TABS Take 75 mg by mouth at bedtime.     ALPRAZolam (XANAX) 0.25 MG tablet Take 0.25 mg by mouth daily as needed for anxiety.  (Patient not taking: Reported on 01/08/2024)     gabapentin  (NEURONTIN ) 400 MG capsule Take 1 capsule (400 mg total) by mouth 3 (three) times daily as needed (for leg pain or soreness). (Patient not taking: Reported on 01/08/2024)     Levodopa  (INBRIJA ) 42 MG CAPS You can inhale the capsules as needed up to 5 times per day, separated by 2 hour intervals. (Patient not taking: Reported on 01/08/2024) 60 capsule 1   Pramipexole  Dihydrochloride 0.75 MG TB24 TAKE 1 TABLET DAILY (Patient not taking: Reported on 01/08/2024) 30 tablet 0   ramelteon  (ROZEREM ) 8 MG tablet TAKE 1 TABLET AT BEDTIME. (Patient not taking: Reported on 01/08/2024) 30 tablet 0   No facility-administered medications prior to visit.     Allergies:   Other, Phenergan [promethazine hcl], Acetaminophen , Itraconazole, Promethazine, and Tamsulosin    Family History:  The patient's family history  includes Cancer in her mother; Healthy in her brother, child, and sister.   ROS:   Please see the history of present illness.    ROS All other systems are reviewed and are negative.   PHYSICAL EXAM:   VS:  BP 136/88 (BP Location: Left Arm, Patient Position: Sitting, Cuff Size: Normal)   Pulse 89   Ht 5\' 4"  (1.626 m)   Wt 71.9 kg   SpO2 94%   BMI 27.22 kg/m      General: Alert, oriented x3, no distress, minimally overweight Head: no evidence of trauma,  PERRL, EOMI, no exophtalmos or lid lag, no myxedema, no xanthelasma; normal ears, nose and oropharynx Neck: normal jugular venous pulsations and no hepatojugular reflux; brisk carotid pulses without delay and possible right carotid bruit Chest: clear to auscultation, no signs of consolidation by percussion or palpation, normal fremitus, symmetrical and full respiratory excursions Cardiovascular: normal position and quality of the apical impulse, regular rhythm, normal first and second heart sounds, no murmurs, rubs or gallops Abdomen: no tenderness or distention, no masses by palpation, no abnormal pulsatility or arterial bruits, normal bowel sounds, no hepatosplenomegaly Extremities: no clubbing, cyanosis or edema; 2+ radial, ulnar and brachial pulses bilaterally; 2+ right femoral, posterior tibial and dorsalis pedis pulses; 2+ left femoral, posterior tibial and dorsalis pedis pulses; no subclavian or femoral bruits Neurological: grossly nonfocal Psych: Normal mood and affect    Wt Readings from Last 3 Encounters:  01/08/24 71.9 kg  12/05/23 73.9 kg  01/30/23 72.6 kg      Studies/Labs Reviewed:   EKG:    EKG Interpretation Date/Time:  Monday Jan 08 2024 10:04:37 EDT Ventricular Rate:  89 PR Interval:  172 QRS Duration:  124 QT Interval:  426 QTC Calculation: 518 R Axis:   -73  Text Interpretation: Normal sinus rhythm Left axis deviation Left bundle branch block Cannot rule out Inferior infarct (cited on or before  08-Jan-2024) No significant change since last tracing Confirmed by Ahnaf Caponi ([id number]) on 01/08/2024 10:21:07 AM        Recent Labs: 11/12/2023: BUN 16; Creatinine, Ser 0.88; Hemoglobin 13.6; Platelets 262; Potassium 3.3; Sodium 137  10/04/2018 hemoglobin 12.9, creatinine 0.4, potassium 3.8, normal liver function tests, TSH 1.93  Lipid Panel    Component Value Date/Time   CHOL  09/04/2008 0105    155        ATP III CLASSIFICATION:  <200     mg/dL   Desirable  578-469  mg/dL   Borderline High  >=629    mg/dL   High          TRIG 528 (H) 09/04/2008 0105   HDL 40 09/04/2008 0105   CHOLHDL 3.9 09/04/2008 0105   VLDL 35 09/04/2008 0105   LDLCALC  09/04/2008 0105    80        Total Cholesterol/HDL:CHD Risk Coronary Heart Disease Risk Table                     Men   Women  1/2 Average Risk   3.4   3.3  Average Risk       5.0   4.4  2 X Average Risk   9.6   7.1  3 X Average Risk  23.4   11.0        Use the calculated Patient Ratio above and the CHD Risk Table to determine the patient's CHD Risk.        ATP III CLASSIFICATION (LDL):  <100     mg/dL   Optimal  413-244  mg/dL   Near or Above                    Optimal  130-159  mg/dL   Borderline  010-272  mg/dL   High  >536     mg/dL   Very High  64/40/3474 total cholesterol 190, HDL 42, LDL 101, triglycerides 228  Additional studies/ records that were reviewed today include:  Records from PCP   ASSESSMENT:    1. LBBB (left bundle branch block)  2. Essential hypertension   3. Carotid bruit, unspecified laterality      PLAN:  In order of problems listed above:  LBBB: This appears to be a rate related abnormality (not seen at heart rate of 76 bpm, present at heart rate of 90 bpm).  Previous work-up shows no evidence of structural heart disease.  She has not had problems with bradycardia or syncope or edema to suggest that she has episodes of high-grade AV block. Orthostatic hypotension: Currently this is fairly  mild.  She has midodrine  available "as needed", but should not take this on a daily basis.  Reviewed the importance of not lying supine after taking this medication, for at least 4 hours.  Discussed timing of the doses of midodrine  so that the drug is available when she is more likely to be physically active.  Reviewed the usefulness of nonpharmacological means to treat this: Gradual changes in position, staying well-hydrated, relatively liberal salt intake, compression stockings and abdominal binder. Parkinson's disease: With prominent dyskinesia.  Seeing Dr. Marcelline Sermons with [facility]. Schedule carotid ultrasound for bruit    Medication Adjustments/Labs and Tests Ordered: Current medicines are reviewed at length with the patient today.  Concerns regarding medicines are outlined above.  Medication changes, Labs and Tests ordered today are listed in the Patient Instructions below. Patient Instructions  Medication Instructions:  No changes *If you need a refill on your cardiac medications before your next appointment, please call your pharmacy*  Testing/Procedures: Your physician has requested that you have a carotid duplex. This test is an ultrasound of the carotid arteries in your neck. It looks at blood flow through these arteries that supply the brain with blood. Allow one hour for this exam. There are no restrictions or special instructions.   Follow-Up: At [facility], you and your health needs are our priority.  As part of our continuing mission to provide you with exceptional heart care, our providers are all part of one team.  This team includes your primary Cardiologist (physician) and Advanced Practice Providers or APPs (Physician Assistants and Nurse Practitioners) who all work together to provide you with the care you need, when you need it.  Your next appointment:   1 year(s)  Provider:   Luana Rumple, MD    We recommend signing up for the patient portal  called "MyChart".  Sign up information is provided on this After Visit Summary.  MyChart is used to connect with patients for Virtual Visits (Telemedicine).  Patients are able to view lab/test results, encounter notes, upcoming appointments, etc.  Non-urgent messages can be sent to your provider as well.   To learn more about what you can do with MyChart, go to [email or web address].          Signed, Morgan Rumple, MD  01/10/2024 5:03 PM    [facility] [address] [place], [place], [place]  [address] Phone: [phone]; Fax: [phone]

## 2024-01-09 ENCOUNTER — Encounter (HOSPITAL_BASED_OUTPATIENT_CLINIC_OR_DEPARTMENT_OTHER): Admitting: Physical Therapy

## 2024-01-09 DIAGNOSIS — E785 Hyperlipidemia, unspecified: Secondary | ICD-10-CM | POA: Diagnosis not present

## 2024-01-09 DIAGNOSIS — G20B2 Parkinson's disease with dyskinesia, with fluctuations: Secondary | ICD-10-CM | POA: Diagnosis not present

## 2024-01-09 DIAGNOSIS — F419 Anxiety disorder, unspecified: Secondary | ICD-10-CM | POA: Diagnosis not present

## 2024-01-09 DIAGNOSIS — I10 Essential (primary) hypertension: Secondary | ICD-10-CM | POA: Diagnosis not present

## 2024-01-09 DIAGNOSIS — E559 Vitamin D deficiency, unspecified: Secondary | ICD-10-CM | POA: Diagnosis not present

## 2024-01-09 DIAGNOSIS — M8580 Other specified disorders of bone density and structure, unspecified site: Secondary | ICD-10-CM | POA: Diagnosis not present

## 2024-01-09 DIAGNOSIS — F5101 Primary insomnia: Secondary | ICD-10-CM | POA: Diagnosis not present

## 2024-01-09 DIAGNOSIS — G903 Multi-system degeneration of the autonomic nervous system: Secondary | ICD-10-CM | POA: Diagnosis not present

## 2024-01-09 DIAGNOSIS — M810 Age-related osteoporosis without current pathological fracture: Secondary | ICD-10-CM | POA: Diagnosis not present

## 2024-01-09 DIAGNOSIS — Z0000 Encounter for general adult medical examination without abnormal findings: Secondary | ICD-10-CM | POA: Diagnosis not present

## 2024-01-09 DIAGNOSIS — Z1331 Encounter for screening for depression: Secondary | ICD-10-CM | POA: Diagnosis not present

## 2024-01-09 DIAGNOSIS — G43909 Migraine, unspecified, not intractable, without status migrainosus: Secondary | ICD-10-CM | POA: Diagnosis not present

## 2024-01-10 DIAGNOSIS — H26491 Other secondary cataract, right eye: Secondary | ICD-10-CM | POA: Diagnosis not present

## 2024-01-11 ENCOUNTER — Ambulatory Visit (HOSPITAL_BASED_OUTPATIENT_CLINIC_OR_DEPARTMENT_OTHER): Admitting: Physical Therapy

## 2024-01-17 ENCOUNTER — Encounter (HOSPITAL_BASED_OUTPATIENT_CLINIC_OR_DEPARTMENT_OTHER): Admitting: Physical Therapy

## 2024-01-17 DIAGNOSIS — H26492 Other secondary cataract, left eye: Secondary | ICD-10-CM | POA: Diagnosis not present

## 2024-01-20 ENCOUNTER — Ambulatory Visit (HOSPITAL_BASED_OUTPATIENT_CLINIC_OR_DEPARTMENT_OTHER): Admitting: Physical Therapy

## 2024-01-20 ENCOUNTER — Encounter (HOSPITAL_BASED_OUTPATIENT_CLINIC_OR_DEPARTMENT_OTHER): Payer: Self-pay | Admitting: Physical Therapy

## 2024-01-20 DIAGNOSIS — R2689 Other abnormalities of gait and mobility: Secondary | ICD-10-CM

## 2024-01-20 DIAGNOSIS — G20A1 Parkinson's disease without dyskinesia, without mention of fluctuations: Secondary | ICD-10-CM | POA: Diagnosis not present

## 2024-01-20 NOTE — Therapy (Signed)
 OUTPATIENT PHYSICAL THERAPY LOWER EXTREMITY TREATMENT   Patient Name: Morgan [name] MRN: [id number] DOB:Feb 23, 1951, 73 y.o., female Today's Date: 01/20/2024    PT End of Session - 01/20/24 1049     Visit Number 54    Number of Visits 84    Date for PT Re-Evaluation 01/24/24    Progress Note Due on Visit 60    PT Start Time 1049    PT Stop Time 1127    PT Time Calculation (min) 38 min    Activity Tolerance Patient tolerated treatment well    Behavior During Therapy WFL for tasks assessed/performed                    Past Medical History:  Diagnosis Date   Anxiety    Fibromyalgia    Headache    Hypercholesteremia    controlled on medication   Hyperlipidemia    Hypertension    no current meds   LBBB (left bundle branch block)    Melanoma (HCC)    MVP (mitral valve prolapse)    Osteopenia    Parkinson disease (HCC)    Pneumonia    SVD (spontaneous vaginal delivery)    x 2   Past Surgical History:  Procedure Laterality Date   BLEPHAROPLASTY     cold knife conization     COLONOSCOPY     FH colon CA   CYSTOSCOPY W/ URETERAL STENT PLACEMENT Left 10/18/2021   Procedure: CYSTOSCOPY WITH RETROGRADE PYELOGRAM, URETERAL STENT PLACEMENT;  Surgeon: Christina Coyer, MD;  Location: [facility];  Service: Urology;  Laterality: Left;   DILATATION & CURETTAGE/HYSTEROSCOPY WITH MYOSURE N/A 02/14/2018   Procedure: ATTEMPTED DILATATION & CURETTAGE/HYSTEROSCOPY WITH MYOSURE;  Surgeon: Arlee Lace, MD;  Location: [facility];  Service: Gynecology;  Laterality: N/A;  polyp   melanoma cancer     removed from left leg   RECONSTRUCTION OF EYELID     TONSILLECTOMY     TUBAL LIGATION     URETEROSCOPY WITH HOLMIUM LASER LITHOTRIPSY Left 12/17/2021   Procedure: URETEROSCOPY/HOLMIUM LASER/STENT EXCHANGE;  Surgeon: Christina Coyer, MD;  Location: [facility];  Service: Urology;  Laterality: Left;   WISDOM TOOTH EXTRACTION     Patient Active Problem List   Diagnosis Date Noted   COVID-19  virus infection 05/16/2022   HTN (hypertension) 10/20/2021   Physical deconditioning 10/20/2021   Hydronephrosis with infection 10/18/2021   Bacteremia due to Escherichia coli 10/18/2021   Severe sepsis (HCC) 10/17/2021   UTI (urinary tract infection) 10/17/2021   Acute metabolic encephalopathy 10/17/2021   Parkinson disease (HCC) 10/17/2021   Cervical stenosis (uterine cervix) 02/14/2018   Postmenopausal bleeding 02/14/2018   LBBB (left bundle branch block) 12/25/2015   PVCs (premature ventricular contractions) 12/25/2015   Hyperlipidemia 12/25/2015   Fibromyalgia 01/16/2014   Cholelithiasis 04/25/2013   Paralysis agitans (HCC) 01/15/2013   Hereditary and idiopathic peripheral neuropathy 01/15/2013     Faustina Hood MD PCP:   Waunita Haff PROVIDER: Ronnell Coins MD   REFERRING DIAG: Parkinsons   THERAPY DIAG:  Other abnormalities of gait and mobility  Parkinson's disease without dyskinesia or fluctuating manifestations (HCC)  Rationale for Evaluation and Treatment Rehabilitation  ONSET DATE: Several years   SUBJECTIVE:   SUBJECTIVE STATEMENT: Patient states been busy with a ton of doctors appointments. Feeling mostly ok. Today is a fair day.    PERTINENT HISTORY: Fibromyalgia; headaches, LBBB; osteopenia   PAIN:  Are you having pain? no: NPRS scale: 0/10 Location: Pain description:  Aggravating factors: just comes and goes  Relieving factors: just comes and goes; sometimes ibuprofen  works but she is limited as to how much she can take  PRECAUTIONS: Patient reports that because of her heart she will pass out at times.   WEIGHT BEARING RESTRICTIONS No  FALLS:  Has patient fallen in last 6 months? Yes.  6 falls but likley more. Last fall    LIVING ENVIRONMENT: Stairs to the second floor in her house but she dosen't do much  4 steps in the front  2 steps in the garage  OCCUPATION: retired   Presenter, broadcasting: reading; goes to an exercise class   PLOF:  Independent with household mobility with device  PATIENT GOALS  To have less pain in her legs and to move better    OBJECTIVE:  (measurements taken at evaluation unless otherwise noted)   DIAGNOSTIC FINDINGS:   PATIENT SURVEYS:  FOTO    COGNITION:  Overall cognitive status: Within functional limits for tasks assessed     SENSATION: Denies paresthesias   POSTURE: No Significant postural limitations  PALPATION: No trigger points noted   LOWER EXTREMITY ROM:  Passive ROM Right eval Left eval  Hip flexion    Hip extension    Hip abduction    Hip adduction    Hip internal rotation    Hip external rotation    Knee flexion    Knee extension    Ankle dorsiflexion    Ankle plantarflexion    Ankle inversion    Ankle eversion     (Blank rows = not tested)  WNL  LOWER EXTREMITY MMT:  MMT Right eval Left eval Right  7/22 Left  7/2 Right  9/12 Left 9/12 Right 06/13/23 Left 06/13/23 Right 08/02/23 Left 08/02/23 Right 10/18/23 Left 10/18/23 Right 12/13/23 Left 12/13/23  Hip flexion 21.7 29.7 23.5 25.9 24.6 25.6 41.3 37.8 47 45.5 44.9 46 49.1 50.5  Hip extension                Hip abduction 27.6 18.5 19.1 8.7 21.2 14.4 29.9 29.5 35.5 35.3 25.7 35.5 35.3 41.9  Hip adduction                Hip internal rotation                Hip external rotation                Knee flexion                Knee extension 23.5 20.2 17.7 16.8 22.7 16.3 39.4 49.8 48.8 53.5 48 42.3 40 40.9  Ankle dorsiflexion                Ankle plantarflexion                Ankle inversion                Ankle eversion                 (Blank rows = not tested)  GAIT: Decreased hip flexion; leans to the left; right hip frop. Patient required CGA while walking and min a when she turned for balance. No change from IE   Functional tests:  Sit to stand: cuing to catch balance. Uses momentum to stand.    5x sit to stand Balance: 13 sec   9/12 5x sit to stand 13 sec Narrow base min a  Narrow  base eyes closed min to mod  a  Tandem stance Min a both ways.  Single leg stance 5 sec bilateral     06/13/23: 285 feet with walking stick, CGA  for safety  Reassessment 08/02/23:  258 feet with walking stick, CGA  for safety 5xSTS: 13.5 seconds with poor eccentric control  Reassessment 10/18/23:  - deferred today due to PT injury 5xSTS: 13.36 seconds with poor eccentric control, posterior lean with transfer/standing  Reassessment 10/18/23:  - deferred today due to PT injury 5xSTS: 10.97 seconds with 3/4 STS each rep, - took 4 attempts for patient to complete that due to impaired initation poor eccentric control, posterior lean with transfer/standing  Reassessment 12/13/23:  - 272 feet with intermittent unsteadiness and cueing for breathing and step length when shuffling 5xSTS: 10.01 seconds without UE support with decreased eccentric control  BERG Balance Test          Date:      Sit to Stand 1 2 06/13/23: 2 08/02/23: 3 10/18/23: 3 11/22/23: 3 12/13/23: 3  Standing unsupported 3 3 3 3 3 3 3   Sitting with back unsupported but feet supported 1 3 4 4 4 4 4   Stand to sit  1 2 3 3 3 1 3   Transfers  2 1 2 2 3 2 3   Standing unsupported with eyes closed 3 3 3 3 3 3 3   Standing unsupported feet together 3 3 3 3 3 3 3   From standing position, reach forward with outstretched arm 1 2 3 3 3 3 3   From standing position, pick up object from floor 1 2 3 3 3 3 3   From standing position, turn and look behind over each shoulder 1 2 2 3 3 3 3   Turn 360 2 1 2 2 2 2 2   Standing unsupported, alternately place foot on step 1 1 2 2 2 2 2   Standing unsupported, one foot in front 1 1 2 2 2 2 2   Standing on one leg 0 0 2 1 2 1 2   Total:  21/56 26/56 34/56  33/56 36/56 35/56  39/56      TODAY'S TREATMENT: 01/20/24 Nustep level 5 seat 8,  6 minutes Seated bicep curl and shoulder press 4# 3x 10 Seated punches 4# 3 x 10 Step taps 6 inch 2 x 30 Step up 6 inch 1 x 10 STS with overhead reach  3x10   01/06/24 Nustep level 5 seat 8,  6 minutes Seated bicep curl and shoulder press 4# 3x 10 Seated punches 4# 3 x 10 Step taps 6 inch 2 x 30 Step up 6 inch 2 x 10 STS with overhead reach 3x10  01/03/24 Nustep level 5 seat 8,  6 minutes STS with overhead reach 2x10 Hip adduction machine 55# 3 x 15 Hamstring curl machine 40# 3 x 10  Knee extension 10# 3 x 10  12/27/23 Nustep level 5 seat 8,  10 minutes STS with overhead reach 2x10 Hamstring curl machine 30# 2 x 10  Knee extension 10# 2 x 10 Hip abduction 40# 3 x 15  12/12/23 Nustep level 5 seat 8,  5 minutes Reassessment Hurdle step over 2 x 10 forward and lateral   12/09/23 Nustep level 5 seat 8,  5 minutes Seated bicep curl and shoulder press 4# 2 x 10 Gait with cueing for step length 40 feet    12/06/23 Nustep level 5 seat 8,  5 minutes STS with overhead reach 2x10 Step up onto airex 2 x 10 Hurdle  step over 2 x 10 forward and lateral Step taps 6 inch 2 x 30 Seated bicep curl and shoulder press 4# 2 x 10   12/04/23 There-ex:   Nu-step 5 min L3   There-act:  Sit to stand 3x3 with CGA for balance  Hurdle step over 2x10 each  fwd and lateral   Neuro -re -ed  Step onto air-ex 2x10  Ai-ex rock 2x10   There-ex  Bicpes curls with posture 2x10 4 lbs  Punches with posture 2x10 4 lbs  Press up 2x10 4 lbs   11/29/23 Nustep level 5 seat 8,  10 minutes with subjective and POC discussion with patient/spouse Seated bicep curl and shoulder press 4# 2 x 10 Seated punches 4# 3 x 10 Step taps 6 inch 2 x 30 STS with overhead reach 2x10 Seated tricep dips 2 x 10    11/25/23 Nustep level 5, seat 8, 5 minutes   11/22/23 Nustep level 5, seat 8, 10 minutes with subjective and POC discussion with patient/spouse Reassessment   11/18/23 Nustep level 5, seat 8, 5 minutes STS with overhead reach 3x10  Seated bicep curl and shoulder press 4# 2 x 10 Anterior stepping with overhead reach 2 x 10 Lateral stepping with  overhead reach 2 x 10 Shoulder flexion 1# 2 x 10  Seated shoulder abduction 1# 2 x 10 LAQ 3# 2x 10  11/15/23 Nustep level 5, seat 8, 6 minutes Bicep curl and shoulder press 3# 2 x 10 Shoulder flexion 1# 2 x 10  Seated ab iso with ball 2x 10 x 5 second holds Seated row GTB 2 x 15 Seated shoulder extension GTB 2 x 15   11/11/23 Nustep 5 minutes for dynamic warm up and conditioning level 5, seat 7 Seated shoulder press 4# 3 x 10  Seated punches 4# 3 x 10 Gait with RW 1 x 40 feet, 1 x 100 feet; gait with walking stick 1 x 100 Seated ab iso with ball 10 x 5 second holds Anterior stepping with overhead reach 2 x 12 Lateral stepping with overhead reach 2 x 10  11/08/23 Seated shoulder press 3# 2 x 10  Seated LAQ 2 x 10 with 5 second holds Seated hip adduction isometric 10 x 10 second holds Seated hip abduction isometric 10 x 10 second holds Seated bicep curls 3# bar 2 x 20   11/04/23 Nustep 5 minutes for dynamic warm up and conditioning level 5, seat 7 Hamstring curl machine 30# 2 x 10  Leg press machine 10# 2 x 10  Knee extension 10# 2 x 10 Hip adduction 40# 3 x 15 Hip abduction 40# 3 x 15  PATIENT EDUCATION:  Education details: exercise rationale and technique   Person educated: Patient Education method: Explanation, Demonstration, Tactile cues, Verbal cues, Education comprehension: verbalized understanding, returned demonstration, verbal cues required, tactile cues required, and needs further education   HOME EXERCISE PROGRAM: Access Code: [email or web address]   URL: [email or web address] Date: 06/28/2023 - Seated March with Resistance  - 1 x daily - 7 x weekly - 3 sets - 15 reps - Seated Hip Abduction with Resistance  - 1 x daily - 7 x weekly - 3 sets - 15 reps - Seated Long Arc Quad  - 1 x daily - 7 x weekly - 3 sets - 15 reps - Shoulder External Rotation and Scapular Retraction with Resistance  - 1 x daily - 7 x weekly - 2 sets - 10 reps - Seated Shoulder  Horizontal Abduction with  Resistance  - 1 x daily - 7 x weekly - 2 sets - 10 reps  08/11/23 - Seated Punches  - 1 x daily - 7 x weekly - 3 sets - 10 reps - Seated Shoulder Overhead Press with Dumbbells with PLB  - 1 x daily - 7 x weekly - 3 sets - 10 reps - Seated Bicep Curls Supinated with Dumbbells  - 1 x daily - 7 x weekly - 3 sets - 10 reps - Seated Shoulder Abduction with Dumbbells - Thumbs Up  - 1 x daily - 7 x weekly - 3 sets - 10 reps  ASSESSMENT:  CLINICAL IMPRESSION: Nustep for dynamic warm up. Performed UE/LE strengthening and balance exercises which are tolerated well. Frequent cueing for sequencing, control, and mechanics throughout session with fair carry over. Patient will continue to benefit from PT to improve function and reduce impairment.    OBJECTIVE IMPAIRMENTS Abnormal gait, decreased activity tolerance, decreased balance, decreased mobility, difficulty walking, decreased strength, impaired tone, and pain.   ACTIVITY LIMITATIONS carrying, lifting, bending, standing, squatting, stairs, transfers, bed mobility, dressing, and locomotion level  PARTICIPATION LIMITATIONS: meal prep, cleaning, laundry, driving, shopping, community activity, and yard work  PERSONAL FACTORS Fibromyalgia; headaches, LBBB; osteopenia  are also affecting patient's functional outcome.   REHAB POTENTIAL: Good  CLINICAL DECISION MAKING: Evolving/moderate complexity increasing pain in her quads   EVALUATION COMPLEXITY: Moderate   GOALS: Goals reviewed with patient? Yes  SHORT TERM GOALS: Target date: 04/18/2023  Patient will transfer sit to stand smoothly and independently  Baseline: Goal status: depends on the day 9/12  2.  Patient will increase gross strength by 5lbs  Baseline:  Goal status: MET  3.  Patient will improve BERG score by 10  Baseline:  Goal status: MET LONG TERM GOALS: Target date:POC  Patient will go improve balance to CGA for all balance exercises to improve safety   Baseline:  Goal status: INITIAL  2.  Patient will report a 50% reduction in falls  Baseline:  Goal status: INITIAL  3.  Patient will have a full balance program  Baseline:  Goal status: INITIAL 4. Patient will score at least 38/56 on Berg balance scale in order to demonstrate improved balance to reduce the risk for falls at home and in the community.  Baseline: 12/13/23 39/56 Goal status: MET   PLAN: PT FREQUENCY: 2x/week  PT DURATION: 6 weeks  PLANNED INTERVENTIONS: Therapeutic exercises, Therapeutic activity, Neuromuscular re-education, Balance training, Gait training, Patient/Family education, Self Care, Joint mobilization, Stair training, Aquatic Therapy, Cryotherapy, Moist heat, Taping, Manual therapy, and Re-evaluation  PLAN FOR NEXT SESSION: strength, balance training, transition to gym as able   Beather Liming, PT DPT 01/20/2024, 11:25 AM

## 2024-01-23 ENCOUNTER — Encounter (HOSPITAL_BASED_OUTPATIENT_CLINIC_OR_DEPARTMENT_OTHER): Payer: Self-pay | Admitting: Emergency Medicine

## 2024-01-23 ENCOUNTER — Emergency Department (HOSPITAL_BASED_OUTPATIENT_CLINIC_OR_DEPARTMENT_OTHER)

## 2024-01-23 ENCOUNTER — Other Ambulatory Visit: Payer: Self-pay

## 2024-01-23 ENCOUNTER — Emergency Department (HOSPITAL_BASED_OUTPATIENT_CLINIC_OR_DEPARTMENT_OTHER)
Admission: EM | Admit: 2024-01-23 | Discharge: 2024-01-23 | Disposition: A | Discharge status: Home / Self Care | Attending: Emergency Medicine | Admitting: Emergency Medicine

## 2024-01-23 ENCOUNTER — Emergency Department (HOSPITAL_BASED_OUTPATIENT_CLINIC_OR_DEPARTMENT_OTHER): Admitting: Radiology

## 2024-01-23 DIAGNOSIS — I6523 Occlusion and stenosis of bilateral carotid arteries: Secondary | ICD-10-CM | POA: Diagnosis not present

## 2024-01-23 DIAGNOSIS — W19XXXA Unspecified fall, initial encounter: Secondary | ICD-10-CM | POA: Insufficient documentation

## 2024-01-23 DIAGNOSIS — S199XXA Unspecified injury of neck, initial encounter: Secondary | ICD-10-CM | POA: Diagnosis not present

## 2024-01-23 DIAGNOSIS — I10 Essential (primary) hypertension: Secondary | ICD-10-CM | POA: Insufficient documentation

## 2024-01-23 DIAGNOSIS — M25552 Pain in left hip: Secondary | ICD-10-CM | POA: Insufficient documentation

## 2024-01-23 DIAGNOSIS — Z7982 Long term (current) use of aspirin: Secondary | ICD-10-CM | POA: Insufficient documentation

## 2024-01-23 DIAGNOSIS — S0990XA Unspecified injury of head, initial encounter: Secondary | ICD-10-CM | POA: Insufficient documentation

## 2024-01-23 DIAGNOSIS — Z79899 Other long term (current) drug therapy: Secondary | ICD-10-CM | POA: Diagnosis not present

## 2024-01-23 LAB — BASIC METABOLIC PANEL WITH GFR
Anion gap: 14 (ref 5–15)
BUN: 16 mg/dL (ref 8–23)
CO2: 26 mmol/L (ref 22–32)
Calcium: 10.4 mg/dL — ABNORMAL HIGH (ref 8.9–10.3)
Chloride: 99 mmol/L (ref 98–111)
Creatinine, Ser: 0.84 mg/dL (ref 0.44–1.00)
GFR, Estimated: >60 mL/min (ref >60)
Glucose, Bld: 104 mg/dL — ABNORMAL HIGH (ref 70–99)
Potassium: 3.4 mmol/L — ABNORMAL LOW (ref 3.5–5.1)
Sodium: 138 mmol/L (ref 135–145)

## 2024-01-23 LAB — CBC WITH DIFFERENTIAL/PLATELET
Abs Immature Granulocytes: 0.02 10*3/uL (ref 0.00–0.07)
Basophils Absolute: 0 10*3/uL (ref 0.0–0.1)
Basophils Relative: 1 %
Eosinophils Absolute: 0.1 10*3/uL (ref 0.0–0.5)
Eosinophils Relative: 2 %
HCT: 42.3 % (ref 36.0–46.0)
Hemoglobin: 13.7 g/dL (ref 12.0–15.0)
Immature Granulocytes: 0 %
Lymphocytes Relative: 29 %
Lymphs Abs: 2.3 10*3/uL (ref 0.7–4.0)
MCH: 29.1 pg (ref 26.0–34.0)
MCHC: 32.4 g/dL (ref 30.0–36.0)
MCV: 89.8 fL (ref 80.0–100.0)
Monocytes Absolute: 0.5 10*3/uL (ref 0.1–1.0)
Monocytes Relative: 6 %
Neutro Abs: 4.9 10*3/uL (ref 1.7–7.7)
Neutrophils Relative %: 62 %
Platelets: 232 10*3/uL (ref 150–400)
RBC: 4.71 MIL/uL (ref 3.87–5.11)
RDW: 13.4 % (ref 11.5–15.5)
WBC: 7.9 10*3/uL (ref 4.0–10.5)
nRBC: 0 % (ref 0.0–0.2)

## 2024-01-23 NOTE — ED Provider Notes (Signed)
 [place] EMERGENCY DEPARTMENT AT [facility] Provider Note   CSN: [id number] Arrival date & time: 01/23/24  1723     History  Chief Complaint  Patient presents with   Morgan [name] is a 73 y.o. female.  Patient here after mechanical fall tonight.  Concern for left hip pain.  Did not sound like necessarily hit her head or lost consciousness.  History of Parkinson's-like illness.  Currently on antibiotics for UTI suppression.  History of fibromyalgia hypertension high cholesterol.  Patient has no pain on exam.  Denies any extremity pain.  Did have left hip pain shortly after the fall but that is improved.  Not on any blood thinners.  No pain with urination.  No numbness weakness tingling.  The history is provided by the patient and a caregiver.       Home Medications Prior to Admission medications   Medication Sig Start Date End Date Taking? Authorizing Provider  ALPRAZolam (XANAX) 0.25 MG tablet Take 0.25 mg by mouth daily as needed for anxiety.  Patient not taking: Reported on 01/08/2024    [provider]  amLODipine  (NORVASC ) 2.5 MG tablet Take 1 tablet (2.5 mg total) by mouth daily. 01/31/23 01/08/24  Volney Grumbles, PA-C  aspirin  EC 81 MG tablet Take 81 mg by mouth daily. Swallow whole.    [provider]  atorvastatin  (LIPITOR) 10 MG tablet Take 10 mg by mouth in the morning.    [provider]  BLINK TEARS 0.25 % SOLN Place 1 drop into both eyes 3 (three) times daily as needed (for dryness).    [provider]  carbidopa -levodopa  (SINEMET  IR) 25-100 MG tablet 1 tablet every 2 hours starting at 6-7am (getting about 6-7 in per day)The medication have a score line so it can be broken. 11/22/22   [name], Von Grumbling, DO  Carbidopa -Levodopa  ER (SINEMET  CR) 25-100 MG tablet controlled release Take 1 tablet by mouth 3 (three) times daily. 11/13/23   [provider]  Cephalexin  250 MG tablet Take 250 mg by mouth at  bedtime. 11/24/23   [provider]  Cholecalciferol (VITAMIN D3) 25 MCG (1000 UT) CHEW Chew 2,500 Units by mouth daily.    [provider]  Coenzyme Q10-Vitamin E (QUNOL ULTRA COQ10 PO) Take 1 capsule by mouth daily.    [provider]  CYMBALTA  60 MG capsule TAKE 1 CAPSULE EVERY       MORNING 10/17/22   [name], Von Grumbling, DO  gabapentin  (NEURONTIN ) 400 MG capsule Take 1 capsule (400 mg total) by mouth 3 (three) times daily as needed (for leg pain or soreness). Patient not taking: Reported on 01/08/2024 05/18/22   [name], Anand D, MD  Ibuprofen  200 MG CAPS Take 2 capsules (400 mg total) by mouth every 8 (eight) hours as needed (Leg pain, headache or mild pain). 05/18/22   [name], Anand D, MD  magnesium oxide (MAG-OX) 400 MG tablet Take 400 mg by mouth at bedtime.    [provider]  methocarbamol (ROBAXIN) 750 MG tablet Take 750 mg by mouth every 6 (six) hours as needed for muscle spasms (or leg pain). 03/25/20   [provider]  midodrine  (PROAMATINE ) 2.5 MG tablet Take 1 tablet (2.5 mg total) by mouth 3 (three) times daily as needed (for systolic blood pressure less than 100). 06/10/22   Walker, Caitlin S, NP  Multiple Vitamins-Minerals (PRESERVISION AREDS 2 PO) Take 1 capsule by mouth 2 (two) times daily.  [provider]  raloxifene  (EVISTA ) 60 MG tablet Take 60 mg by mouth in the morning.    [provider]  TART CHERRY PO Take 3,000 mg by mouth daily.    [provider]  Vibegron (GEMTESA) 75 MG TABS Take 75 mg by mouth at bedtime. 05/04/23   [provider]      Allergies    Other, Phenergan [promethazine hcl], Acetaminophen , Itraconazole, Promethazine, and Tamsulosin     Review of Systems   Review of Systems  Physical Exam Updated Vital Signs BP (!) 145/77   Pulse 89   Temp 97.9 F (36.6 C) (Oral)   Resp 18   Ht 5\' 4"  (1.626 m)   Wt 72.6 kg   SpO2 97%   BMI 27.46 kg/m  Physical Exam Vitals and  nursing note reviewed.  Constitutional:      General: She is not in acute distress.    Appearance: She is well-developed. She is not ill-appearing.  HENT:     Head: Normocephalic and atraumatic.     Nose: Nose normal.     Mouth/Throat:     Mouth: Mucous membranes are moist.  Eyes:     Extraocular Movements: Extraocular movements intact.     Conjunctiva/sclera: Conjunctivae normal.     Pupils: Pupils are equal, round, and reactive to light.  Cardiovascular:     Rate and Rhythm: Normal rate and regular rhythm.     Pulses: Normal pulses.     Heart sounds: Normal heart sounds. No murmur heard. Pulmonary:     Effort: Pulmonary effort is normal. No respiratory distress.     Breath sounds: Normal breath sounds.  Abdominal:     Palpations: Abdomen is soft.     Tenderness: There is no abdominal tenderness.  Musculoskeletal:        General: No swelling.     Cervical back: Normal range of motion and neck supple.  Skin:    General: Skin is warm and dry.     Capillary Refill: Capillary refill takes less than 2 seconds.  Neurological:     General: No focal deficit present.     Mental Status: She is alert and oriented to person, place, and time.     Cranial Nerves: No cranial nerve deficit.     Sensory: No sensory deficit.  Psychiatric:        Mood and Affect: Mood normal.     ED Results / Procedures / Treatments   Labs (all labs ordered are listed, but only abnormal results are displayed) Labs Reviewed  BASIC METABOLIC PANEL WITH GFR - Abnormal; Notable for the following components:      Result Value   Potassium 3.4 (*)    Glucose, Bld 104 (*)    Calcium  10.4 (*)    All other components within normal limits  CBC WITH DIFFERENTIAL/PLATELET    EKG None  Radiology DG Hip Unilat With Pelvis 2-3 Views Left Result Date: 01/23/2024 CLINICAL DATA:  Parkinson's and cognitive issues. Possible UTI. Fall earlier today. Pain after the fall. EXAM: DG HIP (WITH OR WITHOUT PELVIS) 2-3V  LEFT COMPARISON:  CT abdomen pelvis 04/29/2022 FINDINGS: No acute fracture or dislocation. Heterotopic ossification about the left femur. Degenerative changes pubic symphysis, both hips, SI joints and lower lumbar spine. IMPRESSION: No acute fracture or dislocation. Electronically Signed   By: Rozell Cornet M.D.   On: 01/23/2024 20:44   CT Head Wo Contrast Result Date: 01/23/2024 CLINICAL DATA:  Polytrauma, blunt EXAM: CT HEAD  WITHOUT CONTRAST CT CERVICAL SPINE WITHOUT CONTRAST TECHNIQUE: Multidetector CT imaging of the head and cervical spine was performed following the standard protocol without intravenous contrast. Multiplanar CT image reconstructions of the cervical spine were also generated. RADIATION DOSE REDUCTION: This exam was performed according to the departmental dose-optimization program which includes automated exposure control, adjustment of the mA and/or kV according to patient size and/or use of iterative reconstruction technique. COMPARISON:  None Available. FINDINGS: CT HEAD FINDINGS Brain: Patchy and confluent areas of decreased attenuation are noted throughout the deep and periventricular white matter of the cerebral hemispheres bilaterally, compatible with chronic microvascular ischemic disease. No evidence of large-territorial acute infarction. No parenchymal hemorrhage. No mass lesion. No extra-axial collection. No mass effect or midline shift. No hydrocephalus. Basilar cisterns are patent. Vascular: No hyperdense vessel. Atherosclerotic calcifications are present within the cavernous internal carotid arteries. Skull: No acute fracture or focal lesion. Sinuses/Orbits: Paranasal sinuses and mastoid air cells are clear. Bilateral lens replacement. Otherwise the orbits are unremarkable. Other: None. CT CERVICAL SPINE FINDINGS Alignment: Normal. Skull base and vertebrae: Multilevel moderate degenerative changes spine. Partial fusion of the C5-C6 levels. No associated severe osseous neural  foraminal or central canal stenosis. No acute fracture. No aggressive appearing focal osseous lesion or focal pathologic process. Soft tissues and spinal canal: No prevertebral fluid or swelling. No visible canal hematoma. Upper chest: Unremarkable. Other: Lipomatous lesion of the right lower neck/right upper back measuring 4.8 x 2.7 cm (4:48). IMPRESSION: 1. No acute intracranial abnormality. 2. No acute displaced fracture or traumatic listhesis of the cervical spine. Electronically Signed   By: Morgane  Naveau M.D.   On: 01/23/2024 19:44   CT Cervical Spine Wo Contrast Result Date: 01/23/2024 CLINICAL DATA:  Polytrauma, blunt EXAM: CT HEAD WITHOUT CONTRAST CT CERVICAL SPINE WITHOUT CONTRAST TECHNIQUE: Multidetector CT imaging of the head and cervical spine was performed following the standard protocol without intravenous contrast. Multiplanar CT image reconstructions of the cervical spine were also generated. RADIATION DOSE REDUCTION: This exam was performed according to the departmental dose-optimization program which includes automated exposure control, adjustment of the mA and/or kV according to patient size and/or use of iterative reconstruction technique. COMPARISON:  None Available. FINDINGS: CT HEAD FINDINGS Brain: Patchy and confluent areas of decreased attenuation are noted throughout the deep and periventricular white matter of the cerebral hemispheres bilaterally, compatible with chronic microvascular ischemic disease. No evidence of large-territorial acute infarction. No parenchymal hemorrhage. No mass lesion. No extra-axial collection. No mass effect or midline shift. No hydrocephalus. Basilar cisterns are patent. Vascular: No hyperdense vessel. Atherosclerotic calcifications are present within the cavernous internal carotid arteries. Skull: No acute fracture or focal lesion. Sinuses/Orbits: Paranasal sinuses and mastoid air cells are clear. Bilateral lens replacement. Otherwise the orbits are  unremarkable. Other: None. CT CERVICAL SPINE FINDINGS Alignment: Normal. Skull base and vertebrae: Multilevel moderate degenerative changes spine. Partial fusion of the C5-C6 levels. No associated severe osseous neural foraminal or central canal stenosis. No acute fracture. No aggressive appearing focal osseous lesion or focal pathologic process. Soft tissues and spinal canal: No prevertebral fluid or swelling. No visible canal hematoma. Upper chest: Unremarkable. Other: Lipomatous lesion of the right lower neck/right upper back measuring 4.8 x 2.7 cm (4:48). IMPRESSION: 1. No acute intracranial abnormality. 2. No acute displaced fracture or traumatic listhesis of the cervical spine. Electronically Signed   By: Morgane  Naveau M.D.   On: 01/23/2024 19:44    Procedures Procedures    Medications Ordered in ED Medications -  No data to display  ED Course/ Medical Decision Making/ A&P                                 Medical Decision Making Amount and/or Complexity of Data Reviewed Labs: ordered. Radiology: ordered.   Morgan [name] is here after mechanical fall.  Normal vitals.  No fever.  History of Parkinson's-like illness.  History of fibromyalgia high cholesterol.  Overall had left hip pain after the fall but has now improved.  Has chronic UTI/UTI issues.  But not having any symptoms now.  Multiple attempts to try to get a urine sample but ultimately family decided against this.  Did not want to do In-N-Out catheter.  She had no fever no white count normal labs otherwise.  I do not have any major concern for UTI.  She is on Keflex  chronically.  She had a CT scan of her head and neck that were unremarkable.  X-ray of her left hip that was unremarkable.  Overall family doing a great job taking care of her.  She has multiple things at home to help with her care including wheelchair or walker.  She is typically ambulatory on her own which she has been able to do since the fall and able to put  weight on her hip here.  Ultimately I think likely a contusion.  Continue to work with PT OT outpatient.  Continue to work with primary care doctor for outpatient services and support at home.  Patient discharged in good condition.  Understands return precautions.  This chart was dictated using voice recognition software.  Despite best efforts to proofread,  errors can occur which can change the documentation meaning.         Final Clinical Impression(s) / ED Diagnoses Final diagnoses:  Fall, initial encounter    Rx / DC Orders ED Discharge Orders     None         Lowery Rue, DO 01/23/24 2118

## 2024-01-23 NOTE — ED Notes (Signed)
 Pt back from XR

## 2024-01-23 NOTE — ED Notes (Signed)
 Patient transported to X-ray

## 2024-01-23 NOTE — ED Notes (Signed)
 Pt brought back to room, advised that urine sample is needed and I&O cath is ordered.  Pt requests to attempt to void in bathroom before I&O cath.

## 2024-01-23 NOTE — ED Notes (Signed)
 Pt refusing I&O cath at this time. Attempted to obtain clean catch urine without success. Repeat attempt will be made after pts XR

## 2024-01-23 NOTE — ED Triage Notes (Addendum)
 Pt via EMS from home after sustaining a fall at around 4:30pm. Family says she complained of pain after he moved her following the fall. Pt has hx Parkinson's and cognitive issues and is currently on abx for possible UTI, compliant with meds. Denies pain at time of triage. EMS reported differing information per receiving nurse.

## 2024-01-23 NOTE — ED Notes (Signed)
 Patient assisted to restroom.

## 2024-01-24 ENCOUNTER — Ambulatory Visit (HOSPITAL_BASED_OUTPATIENT_CLINIC_OR_DEPARTMENT_OTHER): Admitting: Physical Therapy

## 2024-01-27 ENCOUNTER — Ambulatory Visit (HOSPITAL_BASED_OUTPATIENT_CLINIC_OR_DEPARTMENT_OTHER): Admitting: Physical Therapy

## 2024-01-27 ENCOUNTER — Encounter (HOSPITAL_BASED_OUTPATIENT_CLINIC_OR_DEPARTMENT_OTHER): Payer: Self-pay | Admitting: Physical Therapy

## 2024-01-27 DIAGNOSIS — G20A1 Parkinson's disease without dyskinesia, without mention of fluctuations: Secondary | ICD-10-CM | POA: Diagnosis not present

## 2024-01-27 DIAGNOSIS — R2689 Other abnormalities of gait and mobility: Secondary | ICD-10-CM

## 2024-01-27 NOTE — Therapy (Signed)
 OUTPATIENT PHYSICAL THERAPY LOWER EXTREMITY TREATMENT   Patient Name: Morgan [name] MRN: [id number] DOB:[birth year], 73 y.o., female Today's Date: 01/27/2024 PHYSICAL THERAPY DISCHARGE SUMMARY  Visits from Start of Care: 33  Current functional level related to goals / functional outcomes: See below   Remaining deficits: See below   Education / Equipment: See below   Patient agrees to discharge. Patient goals were met. Patient is being discharged due to meeting the stated rehab goals.   Progress Note   Reporting Period 12/13/23 to 01/27/24   See note below for Objective Data and Assessment of Progress/Goals     PT End of Session - 01/27/24 1042     Visit Number 55    Number of Visits 84    Date for PT Re-Evaluation 01/24/24    Progress Note Due on Visit 60    PT Start Time 1043    PT Stop Time 1125    PT Time Calculation (min) 42 min    Activity Tolerance Patient tolerated treatment well    Behavior During Therapy WFL for tasks assessed/performed                    Past Medical History:  Diagnosis Date   Anxiety    Fibromyalgia    Headache    Hypercholesteremia    controlled on medication   Hyperlipidemia    Hypertension    no current meds   LBBB (left bundle branch block)    Melanoma (HCC)    MVP (mitral valve prolapse)    Osteopenia    Parkinson disease (HCC)    Pneumonia    SVD (spontaneous vaginal delivery)    x 2   Past Surgical History:  Procedure Laterality Date   BLEPHAROPLASTY     cold knife conization     COLONOSCOPY     FH colon CA   CYSTOSCOPY W/ URETERAL STENT PLACEMENT Left 10/18/2021   Procedure: CYSTOSCOPY WITH RETROGRADE PYELOGRAM, URETERAL STENT PLACEMENT;  Surgeon: Christina Coyer, MD;  Location: [facility];  Service: Urology;  Laterality: Left;   DILATATION & CURETTAGE/HYSTEROSCOPY WITH MYOSURE N/A 02/14/2018   Procedure: ATTEMPTED DILATATION & CURETTAGE/HYSTEROSCOPY WITH MYOSURE;  Surgeon: Arlee Lace, MD;  Location:  [facility];  Service: Gynecology;  Laterality: N/A;  polyp   melanoma cancer     removed from left leg   RECONSTRUCTION OF EYELID     TONSILLECTOMY     TUBAL LIGATION     URETEROSCOPY WITH HOLMIUM LASER LITHOTRIPSY Left 12/17/2021   Procedure: URETEROSCOPY/HOLMIUM LASER/STENT EXCHANGE;  Surgeon: Christina Coyer, MD;  Location: [facility];  Service: Urology;  Laterality: Left;   WISDOM TOOTH EXTRACTION     Patient Active Problem List   Diagnosis Date Noted   COVID-19 virus infection 05/16/2022   HTN (hypertension) 10/20/2021   Physical deconditioning 10/20/2021   Hydronephrosis with infection 10/18/2021   Bacteremia due to Escherichia coli 10/18/2021   Severe sepsis (HCC) 10/17/2021   UTI (urinary tract infection) 10/17/2021   Acute metabolic encephalopathy 10/17/2021   Parkinson disease (HCC) 10/17/2021   Cervical stenosis (uterine cervix) 02/14/2018   Postmenopausal bleeding 02/14/2018   LBBB (left bundle branch block) 12/25/2015   PVCs (premature ventricular contractions) 12/25/2015   Hyperlipidemia 12/25/2015   Fibromyalgia 01/16/2014   Cholelithiasis 04/25/2013   Paralysis agitans (HCC) 01/15/2013   Hereditary and idiopathic peripheral neuropathy 01/15/2013     Faustina Hood MD PCP:   Waunita Haff PROVIDER: Ronnell Coins MD   REFERRING DIAG: Parkinsons   THERAPY  DIAG:  Other abnormalities of gait and mobility  Parkinson's disease without dyskinesia or fluctuating manifestations (HCC)  Rationale for Evaluation and Treatment Rehabilitation  ONSET DATE: Several years   SUBJECTIVE:   SUBJECTIVE STATEMENT: Patient states fell and went to ED last week. Been affected by weather being bad lately. Will begin working with trainer. Spouse wants her to continue PT. Falls remain about the same.    PERTINENT HISTORY: Fibromyalgia; headaches, LBBB; osteopenia   PAIN:  Are you having pain? no: NPRS scale: 0/10 Location: Pain description:    Aggravating factors: just comes and  goes  Relieving factors: just comes and goes; sometimes ibuprofen  works but she is limited as to how much she can take  PRECAUTIONS: Patient reports that because of her heart she will pass out at times.   WEIGHT BEARING RESTRICTIONS No  FALLS:  Has patient fallen in last 6 months? Yes.  6 falls but likley more. Last fall    LIVING ENVIRONMENT: Stairs to the second floor in her house but she dosen't do much  4 steps in the front  2 steps in the garage  OCCUPATION: retired   Presenter, broadcasting: reading; goes to an exercise class   PLOF: Independent with household mobility with device  PATIENT GOALS  To have less pain in her legs and to move better    OBJECTIVE:  (measurements taken at evaluation unless otherwise noted)   DIAGNOSTIC FINDINGS:   PATIENT SURVEYS:  FOTO    COGNITION:  Overall cognitive status: Within functional limits for tasks assessed     SENSATION: Denies paresthesias   POSTURE: No Significant postural limitations  PALPATION: No trigger points noted   LOWER EXTREMITY ROM:  Passive ROM Right eval Left eval  Hip flexion    Hip extension    Hip abduction    Hip adduction    Hip internal rotation    Hip external rotation    Knee flexion    Knee extension    Ankle dorsiflexion    Ankle plantarflexion    Ankle inversion    Ankle eversion     (Blank rows = not tested)  WNL  LOWER EXTREMITY MMT:  MMT Right eval Left eval Right  7/22 Left  7/2 Right  9/12 Left 9/12 Right 06/13/23 Left 06/13/23 Right 08/02/23 Left 08/02/23 Right 10/18/23 Left 10/18/23 Right 12/13/23 Left 12/13/23 Right 01/27/24 Left 01/27/24  Hip flexion 21.7 29.7 23.5 25.9 24.6 25.6 41.3 37.8 47 45.5 44.9 46 49.1 50.5 56.6 65.6  Hip extension                  Hip abduction 27.6 18.5 19.1 8.7 21.2 14.4 29.9 29.5 35.5 35.3 25.7 35.5 35.3 41.9 38.2 46.6  Hip adduction                  Hip internal rotation                  Hip external rotation                  Knee flexion                   Knee extension 23.5 20.2 17.7 16.8 22.7 16.3 39.4 49.8 48.8 53.5 48 42.3 40 40.9 57.8 51.6  Ankle dorsiflexion                  Ankle plantarflexion  Ankle inversion                  Ankle eversion                   (Blank rows = not tested)  GAIT: Decreased hip flexion; leans to the left; right hip frop. Patient required CGA while walking and min a when she turned for balance. No change from IE   Functional tests:  Sit to stand: cuing to catch balance. Uses momentum to stand.    5x sit to stand Balance: 13 sec   9/12 5x sit to stand 13 sec Narrow base min a  Narrow base eyes closed min to mod a  Tandem stance Min a both ways.  Single leg stance 5 sec bilateral     06/13/23: 285 feet with walking stick, CGA  for safety  Reassessment 08/02/23:  258 feet with walking stick, CGA  for safety 5xSTS: 13.5 seconds with poor eccentric control  Reassessment 10/18/23:  - deferred today due to PT injury 5xSTS: 13.36 seconds with poor eccentric control, posterior lean with transfer/standing  Reassessment 10/18/23:  - deferred today due to PT injury 5xSTS: 10.97 seconds with 3/4 STS each rep, - took 4 attempts for patient to complete that due to impaired initation poor eccentric control, posterior lean with transfer/standing  Reassessment 12/13/23:  - 272 feet with intermittent unsteadiness and cueing for breathing and step length when shuffling 5xSTS: 10.01 seconds without UE support with decreased eccentric control  Reassessment 01/27/24:  - 307 feet with intermittent unsteadiness and cueing for breathing and step length when shuffling 5xSTS: 12.18 seconds without UE support with decreased eccentric control but improved  BERG Balance Test          Date:      Sit to Stand 1 2 06/13/23: 2 08/02/23: 3 10/18/23: 3 11/22/23: 3 12/13/23: 3 01/27/24: 3  Standing unsupported 3 3 3 3 3 3 3 3   Sitting with back unsupported but feet supported 1 3  4 4 4 4 4 4   Stand to sit  1 2 3 3 3 1 3 3   Transfers  2 1 2 2 3 2 3 3   Standing unsupported with eyes closed 3 3 3 3 3 3 3 3   Standing unsupported feet together 3 3 3 3 3 3 3 3   From standing position, reach forward with outstretched arm 1 2 3 3 3 3 3 3   From standing position, pick up object from floor 1 2 3 3 3 3 3 3   From standing position, turn and look behind over each shoulder 1 2 2 3 3 3 3 3   Turn 360 2 1 2 2 2 2 2 3   Standing unsupported, alternately place foot on step 1 1 2 2 2 2 2 3   Standing unsupported, one foot in front 1 1 2 2 2 2 2 3   Standing on one leg 0 0 2 1 2 1 2 3   Total:  21/56 26/56 34/56  33/56 36/56 35/56  39/56 40/56      TODAY'S TREATMENT: 01/27/24 Nustep level 5 seat 8, 7 minutes Reassessment Anterior stepping with overhead reach 2 x 10 Lateral stepping with overhead reach 2 x 10   01/20/24 Nustep level 5 seat 8,  6 minutes Seated bicep curl and shoulder press 4# 3x 10 Seated punches 4# 3 x 10 Step taps 6 inch 2 x 30 Step up 6 inch 1 x 10  STS with overhead reach 3x10   01/06/24 Nustep level 5 seat 8,  6 minutes Seated bicep curl and shoulder press 4# 3x 10 Seated punches 4# 3 x 10 Step taps 6 inch 2 x 30 Step up 6 inch 2 x 10 STS with overhead reach 3x10  01/03/24 Nustep level 5 seat 8,  6 minutes STS with overhead reach 2x10 Hip adduction machine 55# 3 x 15 Hamstring curl machine 40# 3 x 10  Knee extension 10# 3 x 10  12/27/23 Nustep level 5 seat 8,  10 minutes STS with overhead reach 2x10 Hamstring curl machine 30# 2 x 10  Knee extension 10# 2 x 10 Hip abduction 40# 3 x 15  12/12/23 Nustep level 5 seat 8,  5 minutes Reassessment Hurdle step over 2 x 10 forward and lateral   12/09/23 Nustep level 5 seat 8,  5 minutes Seated bicep curl and shoulder press 4# 2 x 10 Gait with cueing for step length 40 feet    12/06/23 Nustep level 5 seat 8,  5 minutes STS with overhead reach 2x10 Step up onto airex 2 x 10 Hurdle step over 2 x 10  forward and lateral Step taps 6 inch 2 x 30 Seated bicep curl and shoulder press 4# 2 x 10   12/04/23 There-ex:   Nu-step 5 min L3   There-act:  Sit to stand 3x3 with CGA for balance  Hurdle step over 2x10 each  fwd and lateral   Neuro -re -ed  Step onto air-ex 2x10  Ai-ex rock 2x10   There-ex  Bicpes curls with posture 2x10 4 lbs  Punches with posture 2x10 4 lbs  Press up 2x10 4 lbs   11/29/23 Nustep level 5 seat 8,  10 minutes with subjective and POC discussion with patient/spouse Seated bicep curl and shoulder press 4# 2 x 10 Seated punches 4# 3 x 10 Step taps 6 inch 2 x 30 STS with overhead reach 2x10 Seated tricep dips 2 x 10    11/25/23 Nustep level 5, seat 8, 5 minutes   11/22/23 Nustep level 5, seat 8, 10 minutes with subjective and POC discussion with patient/spouse Reassessment   11/18/23 Nustep level 5, seat 8, 5 minutes STS with overhead reach 3x10  Seated bicep curl and shoulder press 4# 2 x 10 Anterior stepping with overhead reach 2 x 10 Lateral stepping with overhead reach 2 x 10 Shoulder flexion 1# 2 x 10  Seated shoulder abduction 1# 2 x 10 LAQ 3# 2x 10  11/15/23 Nustep level 5, seat 8, 6 minutes Bicep curl and shoulder press 3# 2 x 10 Shoulder flexion 1# 2 x 10  Seated ab iso with ball 2x 10 x 5 second holds Seated row GTB 2 x 15 Seated shoulder extension GTB 2 x 15   11/11/23 Nustep 5 minutes for dynamic warm up and conditioning level 5, seat 7 Seated shoulder press 4# 3 x 10  Seated punches 4# 3 x 10 Gait with RW 1 x 40 feet, 1 x 100 feet; gait with walking stick 1 x 100 Seated ab iso with ball 10 x 5 second holds Anterior stepping with overhead reach 2 x 12 Lateral stepping with overhead reach 2 x 10  11/08/23 Seated shoulder press 3# 2 x 10  Seated LAQ 2 x 10 with 5 second holds Seated hip adduction isometric 10 x 10 second holds Seated hip abduction isometric 10 x 10 second holds Seated bicep curls 3# bar 2 x  20   11/04/23 Nustep 5 minutes for dynamic warm up and conditioning level 5, seat 7 Hamstring curl machine 30# 2 x 10  Leg press machine 10# 2 x 10  Knee extension 10# 2 x 10 Hip adduction 40# 3 x 15 Hip abduction 40# 3 x 15  PATIENT EDUCATION:  Education details: exercise rationale and technique   Person educated: Patient Education method: Explanation, Demonstration, Tactile cues, Verbal cues, Education comprehension: verbalized understanding, returned demonstration, verbal cues required, tactile cues required, and needs further education   HOME EXERCISE PROGRAM: Access Code: [email or web address]   URL: [email or web address] Date: 06/28/2023 - Seated March with Resistance  - 1 x daily - 7 x weekly - 3 sets - 15 reps - Seated Hip Abduction with Resistance  - 1 x daily - 7 x weekly - 3 sets - 15 reps - Seated Long Arc Quad  - 1 x daily - 7 x weekly - 3 sets - 15 reps - Shoulder External Rotation and Scapular Retraction with Resistance  - 1 x daily - 7 x weekly - 2 sets - 10 reps - Seated Shoulder Horizontal Abduction with Resistance  - 1 x daily - 7 x weekly - 2 sets - 10 reps  08/11/23 - Seated Punches  - 1 x daily - 7 x weekly - 3 sets - 10 reps - Seated Shoulder Overhead Press with Dumbbells with PLB  - 1 x daily - 7 x weekly - 3 sets - 10 reps - Seated Bicep Curls Supinated with Dumbbells  - 1 x daily - 7 x weekly - 3 sets - 10 reps - Seated Shoulder Abduction with Dumbbells - Thumbs Up  - 1 x daily - 7 x weekly - 3 sets - 10 reps  ASSESSMENT:  CLINICAL IMPRESSION: Patient has met 2/3 short term goals and 3/4 long term goals with ability to complete HEP and improvement in strength, activity tolerance, functional mobility, gait and balance. Remaining goals not met due to continued deficits in  gait, balance, and functional mobility. Patient has made good progress toward remaining goals but symptoms do fluctuate visit to visit. She is overall doing much better over last few  months. Continued falls at home and patient unsure if reduction in frequency. She continues to require CGA to min A and cueing for safety/sequencing with mobility/exercise. Patient will begin working with trainer at the gym and return to PT if necessary. Patient discharged from PT at this time.    OBJECTIVE IMPAIRMENTS Abnormal gait, decreased activity tolerance, decreased balance, decreased mobility, difficulty walking, decreased strength, impaired tone, and pain.   ACTIVITY LIMITATIONS carrying, lifting, bending, standing, squatting, stairs, transfers, bed mobility, dressing, and locomotion level  PARTICIPATION LIMITATIONS: meal prep, cleaning, laundry, driving, shopping, community activity, and yard work  PERSONAL FACTORS Fibromyalgia; headaches, LBBB; osteopenia  are also affecting patient's functional outcome.   REHAB POTENTIAL: Good  CLINICAL DECISION MAKING: Evolving/moderate complexity increasing pain in her quads   EVALUATION COMPLEXITY: Moderate   GOALS: Goals reviewed with patient? Yes  SHORT TERM GOALS: Target date: 04/18/2023  Patient will transfer sit to stand smoothly and independently  Baseline: Goal status: depends on the day 9/12  2.  Patient will increase gross strength by 5lbs  Baseline:  Goal status: MET  3.  Patient will improve BERG score by 10  Baseline:  Goal status: MET LONG TERM GOALS: Target date:POC  Patient will go improve balance to CGA for all balance exercises to improve safety  Baseline: 01/27/24: CGA  to min A Goal status: partially MET  2.  Patient will report a 50% reduction in falls  Baseline:  Goal status: INITIAL  3.  Patient will have a full balance program  Baseline:  Goal status: MET 4. Patient will score at least 38/56 on Berg balance scale in order to demonstrate improved balance to reduce the risk for falls at home and in the community.  Baseline: 12/13/23 39/56 01/27/24: 40/56 Goal status: MET   PLAN: PT FREQUENCY:  2x/week  PT DURATION: 6 weeks  PLANNED INTERVENTIONS: Therapeutic exercises, Therapeutic activity, Neuromuscular re-education, Balance training, Gait training, Patient/Family education, Self Care, Joint mobilization, Stair training, Aquatic Therapy, Cryotherapy, Moist heat, Taping, Manual therapy, and Re-evaluation  PLAN FOR NEXT SESSION: n/a   Beather Liming, PT DPT 01/27/2024, 10:43 AM

## 2024-01-31 ENCOUNTER — Encounter (HOSPITAL_BASED_OUTPATIENT_CLINIC_OR_DEPARTMENT_OTHER): Admitting: Physical Therapy

## 2024-02-01 DIAGNOSIS — N3946 Mixed incontinence: Secondary | ICD-10-CM | POA: Diagnosis not present

## 2024-02-01 DIAGNOSIS — R8271 Bacteriuria: Secondary | ICD-10-CM | POA: Diagnosis not present

## 2024-02-03 ENCOUNTER — Encounter (HOSPITAL_BASED_OUTPATIENT_CLINIC_OR_DEPARTMENT_OTHER): Admitting: Physical Therapy

## 2024-02-07 ENCOUNTER — Encounter (HOSPITAL_BASED_OUTPATIENT_CLINIC_OR_DEPARTMENT_OTHER): Admitting: Physical Therapy

## 2024-02-10 ENCOUNTER — Encounter (HOSPITAL_BASED_OUTPATIENT_CLINIC_OR_DEPARTMENT_OTHER): Admitting: Physical Therapy

## 2024-02-12 ENCOUNTER — Ambulatory Visit (HOSPITAL_COMMUNITY)

## 2024-02-14 ENCOUNTER — Encounter (HOSPITAL_COMMUNITY)

## 2024-02-28 DIAGNOSIS — G20B2 Parkinson's disease with dyskinesia, with fluctuations: Secondary | ICD-10-CM | POA: Diagnosis not present

## 2024-03-04 NOTE — Progress Notes (Unsigned)
 [name] [address] [place] [address] Phone: [phone] Subjective:   Morgan [name], am serving as a scribe for Dr. Arthea [name].  I'm seeing this patient by the request  of:  [name] Pellet, MD  CC: Back and neck pain  YEP:Dlagzrupcz  09/05/2023 Patient has done remarkably well.  Did discuss that I do think balance and coordination will continue to improve with potentially some strength training.  Patient has made a very good improvement at the moment.      Update 03/05/2024 Morgan [name] is a 73 y.o. female coming in with complaint of back and neck pain.  Patient has been doing very well.  In reviewing patient's chart did have a fall where patient was in the emergency room on May 27.  Patient did have a CT of the head at that time that was unremarkable.  X-rays of the left hip were also independently visualized by me showing no significant abnormality.  Reviewed patient's note with neurology they are concerned for some of the memory problem.  Attempted a low-dose of a new medication.  Patient states out of PT and now has Systems analyst. Saw neurology last week. Pick up patch today, but hasn't started. Got new lift chair.     Past Medical History:  Diagnosis Date   Anxiety    Fibromyalgia    Headache    Hypercholesteremia    controlled on medication   Hyperlipidemia    Hypertension    no current meds   LBBB (left bundle branch block)    Melanoma (HCC)    MVP (mitral valve prolapse)    Osteopenia    Parkinson disease (HCC)    Pneumonia    SVD (spontaneous vaginal delivery)    x 2   Past Surgical History:  Procedure Laterality Date   BLEPHAROPLASTY     cold knife conization     COLONOSCOPY     FH colon CA   CYSTOSCOPY W/ URETERAL STENT PLACEMENT Left 10/18/2021   Procedure: CYSTOSCOPY WITH RETROGRADE PYELOGRAM, URETERAL STENT PLACEMENT;  Surgeon: Nieves Cough, MD;  Location: [facility];  Service: Urology;  Laterality:  Left;   DILATATION & CURETTAGE/HYSTEROSCOPY WITH MYOSURE N/A 02/14/2018   Procedure: ATTEMPTED DILATATION & CURETTAGE/HYSTEROSCOPY WITH MYOSURE;  Surgeon: Rosalva Sawyer, MD;  Location: [facility];  Service: Gynecology;  Laterality: N/A;  polyp   melanoma cancer     removed from left leg   RECONSTRUCTION OF EYELID     TONSILLECTOMY     TUBAL LIGATION     URETEROSCOPY WITH HOLMIUM LASER LITHOTRIPSY Left 12/17/2021   Procedure: URETEROSCOPY/HOLMIUM LASER/STENT EXCHANGE;  Surgeon: Nieves Cough, MD;  Location: [facility];  Service: Urology;  Laterality: Left;   WISDOM TOOTH EXTRACTION     Social History   Socioeconomic History   Marital status: Married    Spouse name: Not on file   Number of children: 2   Years of education: Not on file   Highest education level: Bachelor's degree (e.g., BA, AB, BS)  Occupational History   Occupation: retired    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  Tobacco Use   Smoking status: Former    Current packs/day: 0.00    Types: Cigarettes    Quit date: 09/29/1997    Years since quitting: 26.4   Smokeless tobacco: Never   Tobacco comments:    chews [place]orette, 10-12/day  Vaping Use   Vaping status: Never Used  Substance and Sexual Activity   Alcohol   use: Yes    Comment: Occasional wine   Drug use: No   Sexual activity: Not on file  Other Topics Concern   Not on file  Social History Narrative   right handed   2 story home    Lives with spouse   Social Drivers of Health   Financial Resource Strain: Low Risk  (09/24/2023)   Received from [facility] Short Social Needs Screening - Medical Financial Resource Strain    Would you like help with any of the following needs? (Select ALL that apply): I don't want help with any of these  Food Insecurity: No Food Insecurity (09/24/2023)   Received from [facility] Short Social Needs Screening - Food Insecurity    Would you like help with any of the following needs? (Select ALL that apply): I don't  want help with any of these  Transportation Needs: No Transportation Needs (09/24/2023)   Received from [facility] Short Social Needs Screening - Transportation    Would you like help with any of the following needs? (Select ALL that apply): I don't want help with any of these  Physical Activity: Not on file  Stress: Not on file  Social Connections: Socially Integrated (09/24/2023)   Received from [facility] Short Social Needs Screening - Social Connection    Would you like help with any of the following needs? (Select ALL that apply): I don't want help with any of these   Allergies  Allergen Reactions   Other Anaphylaxis    Anti Seizure Medications   Phenergan [Promethazine Hcl] Other (See Comments)    Makes Parkinson's worse   Acetaminophen  Other (See Comments)    I just get weird   Itraconazole Hives    Other Reaction(s): Unknown   Promethazine    Tamsulosin  Other (See Comments)    B/P DROPPED TOO MUCH!!   Family History  Problem Relation Age of Onset   Cancer Mother        colon   Healthy Brother    Healthy Sister    Healthy Child     Current Outpatient Medications (Endocrine & Metabolic):    raloxifene  (EVISTA ) 60 MG tablet, Take 60 mg by mouth in the morning.  Current Outpatient Medications (Cardiovascular):    amLODipine  (NORVASC ) 2.5 MG tablet, Take 1 tablet (2.5 mg total) by mouth daily.   atorvastatin  (LIPITOR) 10 MG tablet, Take 10 mg by mouth in the morning.   midodrine  (PROAMATINE ) 2.5 MG tablet, Take 1 tablet (2.5 mg total) by mouth 3 (three) times daily as needed (for systolic blood pressure less than 100).   Current Outpatient Medications (Analgesics):    aspirin  EC 81 MG tablet, Take 81 mg by mouth daily. Swallow whole.   Ibuprofen  200 MG CAPS, Take 2 capsules (400 mg total) by mouth every 8 (eight) hours as needed (Leg pain, headache or mild pain).   Current Outpatient Medications (Other):    ALPRAZolam (XANAX) 0.25 MG tablet,  Take 0.25 mg by mouth daily as needed for anxiety.  (Patient not taking: Reported on 01/08/2024)   BLINK TEARS 0.25 % SOLN, Place 1 drop into both eyes 3 (three) times daily as needed (for dryness).   carbidopa -levodopa  (SINEMET  IR) 25-100 MG tablet, 1 tablet every 2 hours starting at 6-7am (getting about 6-7 in per day)The medication have a score line so it can be broken.   Carbidopa -Levodopa  ER (SINEMET  CR) 25-100 MG tablet controlled  release, Take 1 tablet by mouth 3 (three) times daily.   Cephalexin  250 MG tablet, Take 250 mg by mouth at bedtime.   Cholecalciferol (VITAMIN D3) 25 MCG (1000 UT) CHEW, Chew 2,500 Units by mouth daily.   Coenzyme Q10-Vitamin E (QUNOL ULTRA COQ10 PO), Take 1 capsule by mouth daily.   CYMBALTA  60 MG capsule, TAKE 1 CAPSULE EVERY       MORNING   gabapentin  (NEURONTIN ) 400 MG capsule, Take 1 capsule (400 mg total) by mouth 3 (three) times daily as needed (for leg pain or soreness). (Patient not taking: Reported on 01/08/2024)   magnesium oxide (MAG-OX) 400 MG tablet, Take 400 mg by mouth at bedtime.   methocarbamol (ROBAXIN) 750 MG tablet, Take 750 mg by mouth every 6 (six) hours as needed for muscle spasms (or leg pain).   Multiple Vitamins-Minerals (PRESERVISION AREDS 2 PO), Take 1 capsule by mouth 2 (two) times daily.   TART CHERRY PO, Take 3,000 mg by mouth daily.   Vibegron (GEMTESA) 75 MG TABS, Take 75 mg by mouth at bedtime.   Reviewed prior external information including notes and imaging from  primary care provider As well as notes that were available from care everywhere and other healthcare systems.  Past medical history, social, surgical and family history all reviewed in electronic medical record.  No pertanent information unless stated regarding to the chief complaint.   Review of Systems:  No headache, visual changes, nausea, vomiting, diarrhea, constipation, dizziness, abdominal pain, skin rash, fevers, chills, night sweats, weight loss, swollen  lymph nodes, body aches, joint swelling, chest pain, shortness of breath, mood changes. POSITIVE muscle aches  Objective  Blood pressure (!) 134/90, pulse 81, height 5' 4 (1.626 m), weight 166 lb (75.3 kg), SpO2 96%.   General: No apparent distress alert and oriented x3 mood and affect normal, dressed appropriately.  Seems extremely fatigued. HEENT: Pupils equal, extraocular movements intact  Respiratory: Patient's speak in full sentences and does not appear short of breath  Cardiovascular: No lower extremity edema, non tender, no erythema  Hypertonicity noted in multiple different joints. Patient does have some clonus noted in the lower extremities.  Patient is physically deconditioned shuffling gait using the aid of a walking stick   Impression and Recommendations:    The above documentation has been reviewed and is accurate and complete Ahmed Inniss M Jessyca Sloan, DO

## 2024-03-05 ENCOUNTER — Ambulatory Visit (INDEPENDENT_AMBULATORY_CARE_PROVIDER_SITE_OTHER): Admitting: Family Medicine

## 2024-03-05 VITALS — BP 134/90 | HR 81 | Ht 64.0 in | Wt 166.0 lb

## 2024-03-05 DIAGNOSIS — G20B2 Parkinson's disease with dyskinesia, with fluctuations: Secondary | ICD-10-CM | POA: Diagnosis not present

## 2024-03-05 NOTE — Patient Instructions (Signed)
 Good to see you! 1-2g of creatine daily Keep working with trainer See you again in 3 months

## 2024-03-05 NOTE — Assessment & Plan Note (Addendum)
 Patient is still working hard on trying to increase her strength.  Working with a Systems analyst.  Seems to be somewhat fatigued at the moment.  We discussed icing regimen and home exercises, increase activity slowly.  Follow-up with me again in 12 weeks.  Did discuss potential treatment with creatine.  I do think that they have changed to the long-acting medications could be helpful as well.  Total time with patient as well as reviewing outside records 31 minutes

## 2024-03-19 ENCOUNTER — Ambulatory Visit (HOSPITAL_COMMUNITY)
Admission: RE | Admit: 2024-03-19 | Discharge: 2024-03-19 | Disposition: A | Discharge status: Home / Self Care | Source: Ambulatory Visit | Attending: Cardiovascular Disease | Admitting: Cardiovascular Disease

## 2024-03-19 DIAGNOSIS — R0989 Other specified symptoms and signs involving the circulatory and respiratory systems: Secondary | ICD-10-CM | POA: Diagnosis not present

## 2024-03-20 ENCOUNTER — Ambulatory Visit: Payer: Self-pay | Admitting: Cardiovascular Disease

## 2024-04-16 DIAGNOSIS — H353211 Exudative age-related macular degeneration, right eye, with active choroidal neovascularization: Secondary | ICD-10-CM | POA: Diagnosis not present

## 2024-04-16 DIAGNOSIS — H353122 Nonexudative age-related macular degeneration, left eye, intermediate dry stage: Secondary | ICD-10-CM | POA: Diagnosis not present

## 2024-04-18 DIAGNOSIS — H353123 Nonexudative age-related macular degeneration, left eye, advanced atrophic without subfoveal involvement: Secondary | ICD-10-CM | POA: Diagnosis not present

## 2024-04-18 DIAGNOSIS — H35033 Hypertensive retinopathy, bilateral: Secondary | ICD-10-CM | POA: Diagnosis not present

## 2024-04-18 DIAGNOSIS — H353211 Exudative age-related macular degeneration, right eye, with active choroidal neovascularization: Secondary | ICD-10-CM | POA: Diagnosis not present

## 2024-04-18 DIAGNOSIS — H2513 Age-related nuclear cataract, bilateral: Secondary | ICD-10-CM | POA: Diagnosis not present

## 2024-04-18 DIAGNOSIS — D3132 Benign neoplasm of left choroid: Secondary | ICD-10-CM | POA: Diagnosis not present

## 2024-04-24 DIAGNOSIS — R413 Other amnesia: Secondary | ICD-10-CM | POA: Diagnosis not present

## 2024-04-24 DIAGNOSIS — G20B2 Parkinson's disease with dyskinesia, with fluctuations: Secondary | ICD-10-CM | POA: Diagnosis not present

## 2024-05-17 DIAGNOSIS — N3946 Mixed incontinence: Secondary | ICD-10-CM | POA: Diagnosis not present

## 2024-05-17 DIAGNOSIS — R350 Frequency of micturition: Secondary | ICD-10-CM | POA: Diagnosis not present

## 2024-05-20 NOTE — Telephone Encounter (Signed)
 Patients husband [name] is calling about carbidopa  he said he needs new scripts sent to [facility]    carbidopa -levodopa  (SINEMET  CR) 25-100 mg per CR tablet    carbidopa -levodopa  (SINEMET ) 25-100 mg per tablet    [facility] - [place], [place] - [facility] AT Portal to Registered [facility] - PHONE: [phone] - FAX: [phone]   Ph. [phone] or [phone]

## 2024-05-21 DIAGNOSIS — N3946 Mixed incontinence: Secondary | ICD-10-CM | POA: Diagnosis not present

## 2024-05-21 DIAGNOSIS — R8271 Bacteriuria: Secondary | ICD-10-CM | POA: Diagnosis not present

## 2024-05-22 DIAGNOSIS — H353211 Exudative age-related macular degeneration, right eye, with active choroidal neovascularization: Secondary | ICD-10-CM | POA: Diagnosis not present

## 2024-05-28 DIAGNOSIS — Z23 Encounter for immunization: Secondary | ICD-10-CM | POA: Diagnosis not present

## 2024-05-29 NOTE — Telephone Encounter (Signed)
 [name] for [facility]  Pt is scheduled for 09/02/24  LT will be ooo that week and we need to resch appt

## 2024-05-31 ENCOUNTER — Inpatient Hospital Stay (HOSPITAL_COMMUNITY)
Admission: EM | Admit: 2024-05-31 | Discharge: 2024-06-03 | DRG: 689 | Disposition: A | Discharge status: Home Health | Attending: Internal Medicine | Admitting: Internal Medicine

## 2024-05-31 ENCOUNTER — Encounter (HOSPITAL_COMMUNITY): Payer: Self-pay | Admitting: *Deleted

## 2024-05-31 ENCOUNTER — Emergency Department (HOSPITAL_COMMUNITY)

## 2024-05-31 ENCOUNTER — Other Ambulatory Visit: Payer: Self-pay

## 2024-05-31 DIAGNOSIS — G629 Polyneuropathy, unspecified: Secondary | ICD-10-CM | POA: Diagnosis present

## 2024-05-31 DIAGNOSIS — M797 Fibromyalgia: Secondary | ICD-10-CM | POA: Diagnosis present

## 2024-05-31 DIAGNOSIS — I10 Essential (primary) hypertension: Secondary | ICD-10-CM | POA: Diagnosis not present

## 2024-05-31 DIAGNOSIS — Z7982 Long term (current) use of aspirin: Secondary | ICD-10-CM | POA: Diagnosis not present

## 2024-05-31 DIAGNOSIS — Z886 Allergy status to analgesic agent status: Secondary | ICD-10-CM

## 2024-05-31 DIAGNOSIS — N182 Chronic kidney disease, stage 2 (mild): Secondary | ICD-10-CM | POA: Diagnosis present

## 2024-05-31 DIAGNOSIS — G609 Hereditary and idiopathic neuropathy, unspecified: Secondary | ICD-10-CM | POA: Diagnosis present

## 2024-05-31 DIAGNOSIS — Z79899 Other long term (current) drug therapy: Secondary | ICD-10-CM

## 2024-05-31 DIAGNOSIS — H3530 Unspecified macular degeneration: Secondary | ICD-10-CM | POA: Diagnosis present

## 2024-05-31 DIAGNOSIS — I951 Orthostatic hypotension: Secondary | ICD-10-CM | POA: Diagnosis present

## 2024-05-31 DIAGNOSIS — Z1629 Resistance to other single specified antibiotic: Secondary | ICD-10-CM | POA: Diagnosis present

## 2024-05-31 DIAGNOSIS — G9340 Encephalopathy, unspecified: Secondary | ICD-10-CM | POA: Diagnosis not present

## 2024-05-31 DIAGNOSIS — N390 Urinary tract infection, site not specified: Secondary | ICD-10-CM | POA: Diagnosis present

## 2024-05-31 DIAGNOSIS — E876 Hypokalemia: Secondary | ICD-10-CM | POA: Diagnosis present

## 2024-05-31 DIAGNOSIS — Z85820 Personal history of malignant melanoma of skin: Secondary | ICD-10-CM

## 2024-05-31 DIAGNOSIS — E7800 Pure hypercholesterolemia, unspecified: Secondary | ICD-10-CM | POA: Diagnosis present

## 2024-05-31 DIAGNOSIS — R0989 Other specified symptoms and signs involving the circulatory and respiratory systems: Secondary | ICD-10-CM | POA: Diagnosis not present

## 2024-05-31 DIAGNOSIS — F419 Anxiety disorder, unspecified: Secondary | ICD-10-CM | POA: Diagnosis present

## 2024-05-31 DIAGNOSIS — G20A1 Parkinson's disease without dyskinesia, without mention of fluctuations: Secondary | ICD-10-CM | POA: Diagnosis present

## 2024-05-31 DIAGNOSIS — N3000 Acute cystitis without hematuria: Secondary | ICD-10-CM | POA: Diagnosis not present

## 2024-05-31 DIAGNOSIS — B9620 Unspecified Escherichia coli [E. coli] as the cause of diseases classified elsewhere: Secondary | ICD-10-CM | POA: Diagnosis present

## 2024-05-31 DIAGNOSIS — I129 Hypertensive chronic kidney disease with stage 1 through stage 4 chronic kidney disease, or unspecified chronic kidney disease: Secondary | ICD-10-CM | POA: Diagnosis present

## 2024-05-31 DIAGNOSIS — R32 Unspecified urinary incontinence: Secondary | ICD-10-CM | POA: Diagnosis present

## 2024-05-31 DIAGNOSIS — G9341 Metabolic encephalopathy: Secondary | ICD-10-CM | POA: Diagnosis present

## 2024-05-31 DIAGNOSIS — G9349 Other encephalopathy: Secondary | ICD-10-CM | POA: Diagnosis present

## 2024-05-31 DIAGNOSIS — R4182 Altered mental status, unspecified: Secondary | ICD-10-CM | POA: Diagnosis not present

## 2024-05-31 DIAGNOSIS — E785 Hyperlipidemia, unspecified: Secondary | ICD-10-CM | POA: Diagnosis present

## 2024-05-31 DIAGNOSIS — Z87891 Personal history of nicotine dependence: Secondary | ICD-10-CM | POA: Diagnosis not present

## 2024-05-31 DIAGNOSIS — Z1152 Encounter for screening for COVID-19: Secondary | ICD-10-CM

## 2024-05-31 DIAGNOSIS — I6782 Cerebral ischemia: Secondary | ICD-10-CM | POA: Diagnosis not present

## 2024-05-31 DIAGNOSIS — Z87440 Personal history of urinary (tract) infections: Secondary | ICD-10-CM

## 2024-05-31 DIAGNOSIS — Z888 Allergy status to other drugs, medicaments and biological substances status: Secondary | ICD-10-CM

## 2024-05-31 LAB — CBC WITH DIFFERENTIAL/PLATELET
Abs Immature Granulocytes: 0.03 K/uL (ref 0.00–0.07)
Basophils Absolute: 0 K/uL (ref 0.0–0.1)
Basophils Relative: 0 %
Eosinophils Absolute: 0.2 K/uL (ref 0.0–0.5)
Eosinophils Relative: 3 %
HCT: 42.5 % (ref 36.0–46.0)
Hemoglobin: 12.9 g/dL (ref 12.0–15.0)
Immature Granulocytes: 0 %
Lymphocytes Relative: 18 %
Lymphs Abs: 1.4 K/uL (ref 0.7–4.0)
MCH: 27.8 pg (ref 26.0–34.0)
MCHC: 30.4 g/dL (ref 30.0–36.0)
MCV: 91.6 fL (ref 80.0–100.0)
Monocytes Absolute: 0.4 K/uL (ref 0.1–1.0)
Monocytes Relative: 5 %
Neutro Abs: 5.7 K/uL (ref 1.7–7.7)
Neutrophils Relative %: 74 %
Platelets: 245 K/uL (ref 150–400)
RBC: 4.64 MIL/uL (ref 3.87–5.11)
RDW: 13.5 % (ref 11.5–15.5)
WBC: 7.7 K/uL (ref 4.0–10.5)
nRBC: 0 % (ref 0.0–0.2)

## 2024-05-31 LAB — URINALYSIS, W/ REFLEX TO CULTURE (INFECTION SUSPECTED)
Bilirubin Urine: NEGATIVE
Glucose, UA: NEGATIVE mg/dL
Hgb urine dipstick: NEGATIVE
Ketones, ur: 5 mg/dL — AB
Nitrite: NEGATIVE
Protein, ur: NEGATIVE mg/dL
Specific Gravity, Urine: 1.015 (ref 1.005–1.030)
WBC, UA: >50 WBC/hpf (ref 0–5)
pH: 6 (ref 5.0–8.0)

## 2024-05-31 LAB — COMPREHENSIVE METABOLIC PANEL WITH GFR
ALT: <5 U/L (ref 0–44)
AST: 23 U/L (ref 15–41)
Albumin: 4.3 g/dL (ref 3.5–5.0)
Alkaline Phosphatase: 107 U/L (ref 38–126)
Anion gap: 12 (ref 5–15)
BUN: 18 mg/dL (ref 8–23)
CO2: 27 mmol/L (ref 22–32)
Calcium: 9.1 mg/dL (ref 8.9–10.3)
Chloride: 102 mmol/L (ref 98–111)
Creatinine, Ser: 0.91 mg/dL (ref 0.44–1.00)
GFR, Estimated: >60 mL/min (ref >60)
Glucose, Bld: 99 mg/dL (ref 70–99)
Potassium: 3.3 mmol/L — ABNORMAL LOW (ref 3.5–5.1)
Sodium: 141 mmol/L (ref 135–145)
Total Bilirubin: 0.4 mg/dL (ref 0.0–1.2)
Total Protein: 7.3 g/dL (ref 6.5–8.1)

## 2024-05-31 LAB — RESP PANEL BY RT-PCR (RSV, FLU A&B, COVID)  RVPGX2
Influenza A by PCR: NEGATIVE
Influenza B by PCR: NEGATIVE
Resp Syncytial Virus by PCR: NEGATIVE
SARS Coronavirus 2 by RT PCR: NEGATIVE

## 2024-05-31 LAB — MAGNESIUM: Magnesium: 2.2 mg/dL (ref 1.7–2.4)

## 2024-05-31 LAB — GLUCOSE, CAPILLARY: Glucose-Capillary: 109 mg/dL — ABNORMAL HIGH (ref 70–99)

## 2024-05-31 LAB — PROTIME-INR
INR: 1 (ref 0.8–1.2)
Prothrombin Time: 13.5 s (ref 11.4–15.2)

## 2024-05-31 LAB — PRO BRAIN NATRIURETIC PEPTIDE: Pro Brain Natriuretic Peptide: 258 pg/mL (ref <300.0)

## 2024-05-31 LAB — PROCALCITONIN: Procalcitonin: <0.1 ng/mL

## 2024-05-31 LAB — TSH: TSH: 2.13 u[IU]/mL (ref 0.350–4.500)

## 2024-05-31 LAB — LACTIC ACID, PLASMA: Lactic Acid, Venous: 1.4 mmol/L (ref 0.5–1.9)

## 2024-05-31 LAB — VITAMIN B12: Vitamin B-12: 913 pg/mL (ref 180–914)

## 2024-05-31 MED ORDER — POLYVINYL ALCOHOL 1.4 % OP SOLN
1.0000 [drp] | Freq: Three times a day (TID) | OPHTHALMIC | Status: DC | PRN
Start: 2024-05-31 — End: 2024-06-03

## 2024-05-31 MED ORDER — ENOXAPARIN SODIUM 40 MG/0.4ML IJ SOSY
40.0000 mg | PREFILLED_SYRINGE | INTRAMUSCULAR | Status: DC
  Administered 2024-05-31 – 2024-06-03 (×3): 40 mg via SUBCUTANEOUS
  Filled 2024-05-31 (×3): qty 0.4

## 2024-05-31 MED ORDER — IBUPROFEN 200 MG PO TABS
400.0000 mg | ORAL_TABLET | Freq: Four times a day (QID) | ORAL | Status: DC | PRN
  Administered 2024-05-31 – 2024-06-03 (×2): 400 mg via ORAL
  Filled 2024-05-31 (×2): qty 2

## 2024-05-31 MED ORDER — POTASSIUM CHLORIDE CRYS ER 20 MEQ PO TBCR
20.0000 meq | EXTENDED_RELEASE_TABLET | Freq: Once | ORAL | Status: AC
  Administered 2024-05-31: 20 meq via ORAL
  Filled 2024-05-31: qty 1

## 2024-05-31 MED ORDER — RALOXIFENE HCL 60 MG PO TABS
60.0000 mg | ORAL_TABLET | Freq: Every morning | ORAL | Status: DC
  Administered 2024-05-31 – 2024-06-03 (×4): 60 mg via ORAL
  Filled 2024-05-31 (×4): qty 1

## 2024-05-31 MED ORDER — SODIUM CHLORIDE 0.9 % IV BOLUS
1000.0000 mL | Freq: Once | INTRAVENOUS | Status: AC
  Administered 2024-05-31: 1000 mL via INTRAVENOUS

## 2024-05-31 MED ORDER — CARBIDOPA-LEVODOPA 25-100 MG PO TABS
0.5000 | ORAL_TABLET | ORAL | Status: DC
  Administered 2024-05-31 – 2024-06-03 (×14): 0.5 via ORAL
  Filled 2024-05-31 (×13): qty 1

## 2024-05-31 MED ORDER — DULOXETINE HCL 20 MG PO CPEP
60.0000 mg | ORAL_CAPSULE | Freq: Every morning | ORAL | Status: DC
  Administered 2024-05-31 – 2024-06-03 (×4): 60 mg via ORAL
  Filled 2024-05-31 (×3): qty 3
  Filled 2024-05-31: qty 2

## 2024-05-31 MED ORDER — ATORVASTATIN CALCIUM 10 MG PO TABS
10.0000 mg | ORAL_TABLET | Freq: Every morning | ORAL | Status: DC
  Administered 2024-05-31 – 2024-06-03 (×4): 10 mg via ORAL
  Filled 2024-05-31 (×3): qty 1

## 2024-05-31 MED ORDER — ONDANSETRON HCL 4 MG/2ML IJ SOLN: 4.0000 mg | Freq: Four times a day (QID) | INTRAMUSCULAR | Status: DC | PRN

## 2024-05-31 MED ORDER — AMLODIPINE BESYLATE 5 MG PO TABS
5.0000 mg | ORAL_TABLET | Freq: Every day | ORAL | Status: DC
  Administered 2024-05-31 – 2024-06-03 (×4): 5 mg via ORAL
  Filled 2024-05-31 (×4): qty 1

## 2024-05-31 MED ORDER — PROSIGHT PO TABS
1.0000 | ORAL_TABLET | Freq: Two times a day (BID) | ORAL | Status: DC
  Administered 2024-05-31 – 2024-06-03 (×7): 1 via ORAL
  Filled 2024-05-31 (×7): qty 1

## 2024-05-31 MED ORDER — CEFTRIAXONE SODIUM 1 G IJ SOLR
1.0000 g | INTRAMUSCULAR | Status: DC
  Administered 2024-06-01 – 2024-06-03 (×3): 1 g via INTRAVENOUS
  Filled 2024-05-31 (×3): qty 10

## 2024-05-31 MED ORDER — METHOCARBAMOL 500 MG PO TABS
750.0000 mg | ORAL_TABLET | Freq: Four times a day (QID) | ORAL | Status: DC | PRN
  Administered 2024-06-03: 750 mg via ORAL
  Filled 2024-05-31: qty 2

## 2024-05-31 MED ORDER — CARBIDOPA-LEVODOPA 25-100 MG PO TABS
0.5000 | ORAL_TABLET | ORAL | Status: DC
  Administered 2024-05-31: 0.5 via ORAL
  Filled 2024-05-31: qty 1

## 2024-05-31 MED ORDER — MIRABEGRON ER 25 MG PO TB24
25.0000 mg | ORAL_TABLET | Freq: Every day | ORAL | Status: DC
Start: 2024-05-31 — End: 2024-06-03
  Administered 2024-05-31 – 2024-06-03 (×4): 25 mg via ORAL
  Filled 2024-05-31 (×4): qty 1

## 2024-05-31 MED ORDER — ONDANSETRON HCL 4 MG PO TABS: 4.0000 mg | ORAL_TABLET | Freq: Four times a day (QID) | ORAL | Status: DC | PRN

## 2024-05-31 MED ORDER — DOCUSATE SODIUM 100 MG PO CAPS
100.0000 mg | ORAL_CAPSULE | Freq: Two times a day (BID) | ORAL | Status: DC
  Administered 2024-05-31 – 2024-06-03 (×6): 100 mg via ORAL
  Filled 2024-05-31 (×6): qty 1

## 2024-05-31 MED ORDER — ASPIRIN 81 MG PO TBEC
81.0000 mg | DELAYED_RELEASE_TABLET | Freq: Every day | ORAL | Status: DC
  Administered 2024-05-31 – 2024-06-03 (×4): 81 mg via ORAL
  Filled 2024-05-31 (×4): qty 1

## 2024-05-31 MED ORDER — CARBIDOPA-LEVODOPA ER 25-100 MG PO TBCR
1.0000 | EXTENDED_RELEASE_TABLET | Freq: Three times a day (TID) | ORAL | Status: DC
  Administered 2024-05-31 – 2024-06-03 (×8): 1 via ORAL
  Filled 2024-05-31 (×10): qty 1

## 2024-05-31 MED ORDER — SODIUM CHLORIDE 0.9 % IV SOLN
1.0000 g | Freq: Once | INTRAVENOUS | Status: AC
  Administered 2024-05-31: 1 g via INTRAVENOUS
  Filled 2024-05-31 (×2): qty 10

## 2024-05-31 MED ORDER — MAGNESIUM OXIDE -MG SUPPLEMENT 400 (240 MG) MG PO TABS
400.0000 mg | ORAL_TABLET | Freq: Every day | ORAL | Status: DC
  Administered 2024-05-31 – 2024-06-02 (×3): 400 mg via ORAL
  Filled 2024-05-31 (×3): qty 1

## 2024-05-31 NOTE — Assessment & Plan Note (Signed)
 Continue lipitor 10mg  daily

## 2024-05-31 NOTE — ED Notes (Signed)
 Contacted lab to add on BNP.

## 2024-05-31 NOTE — ED Notes (Signed)
 Husband at bedside, provided update on pt's status/admission.

## 2024-05-31 NOTE — Assessment & Plan Note (Addendum)
 Continue cymbalta , no longer on gabapentin 

## 2024-05-31 NOTE — Assessment & Plan Note (Signed)
 Mild hypokalemia Check magnesium Replete and trend

## 2024-05-31 NOTE — ED Notes (Signed)
 PT at bedside.

## 2024-05-31 NOTE — ED Provider Notes (Signed)
 Care assumed from Lamar Schlossman, PA-C at shift change pending UA.  See his note for full HPI.  In short, patient is a 73 year old female who presents to the ED due to confusion and malodorous urine.  History of frequent UTIs.  Husband notes she was difficult to arouse and seemed more lethargic than baseline.  Has a history of Parkinson's.  No fever or chills.  Denies abdominal pain.  Denies back pain.   Physical Exam  BP (!) 162/96   Pulse 83   Temp 97.6 F (36.4 C) (Oral)   Resp 18   SpO2 97%   Physical Exam Vitals and nursing note reviewed.  Constitutional:      General: She is not in acute distress.    Appearance: She is not ill-appearing.  HENT:     Head: Normocephalic.  Eyes:     Pupils: Pupils are equal, round, and reactive to light.  Cardiovascular:     Rate and Rhythm: Normal rate and regular rhythm.     Pulses: Normal pulses.     Heart sounds: Normal heart sounds. No murmur heard.    No friction rub. No gallop.  Pulmonary:     Effort: Pulmonary effort is normal.     Breath sounds: Normal breath sounds.  Abdominal:     General: Abdomen is flat. There is no distension.     Palpations: Abdomen is soft.     Tenderness: There is no abdominal tenderness. There is no guarding or rebound.  Musculoskeletal:        General: Normal range of motion.     Cervical back: Neck supple.  Skin:    General: Skin is warm and dry.  Neurological:     General: No focal deficit present.     Mental Status: She is alert.  Psychiatric:        Mood and Affect: Mood normal.        Behavior: Behavior normal.     Procedures  Procedures  ED Course / MDM   Clinical Course as of 05/31/24 0714  Fri May 31, 2024  0703 Leukocytes,Ua(!): LARGE [CA]  0703 WBC, UA: >50 [CA]  0703 Bacteria, UA(!): MANY [CA]    Clinical Course User Index [CA] Lorelle Aleck BROCKS, PA-C   Medical Decision Making Amount and/or Complexity of Data Reviewed Independent Historian: spouse Labs: ordered.  Decision-making details documented in ED Course. Radiology: ordered and independent interpretation performed. Decision-making details documented in ED Course.  Risk Decision regarding hospitalization.   Care assumed from Lamar Schlossman, PA-C at shift change pending UA.    73 year old female presents to the ED due to slight confusion.  History of frequent UTIs.  At shift change, UA pending.  I have reviewed all labs from previous provider.  CBC unremarkable.  No leukocytosis.  Normal hemoglobin.  MP with slight hypokalemia at 3.3.  Otherwise unremarkable.  Normal renal function.  Lactic acid normal.  Low suspicion for sepsis at this time.  CT head negative for any acute abnormalities.  Chest x-ray with possible vascular congestion and interstitial opacity raising question of edema.  BNP normal.  Low suspicion for CHF. No cough or fever to suggest PNA. Remote history of smoking.  UA significant for large leukocytes, greater than 50 white blood cells and many bacteria.  Reviewed previous urine culture which was sensitive to Rocephin .  Dose of Rocephin  given.  Patient has no back or abdominal pain to suggest infected kidney stone.   Discussed with husband, [name].  He  notes patient was weaned off Keflex  to 250 mg roughly 1 month ago by urology.  No longer on any prophylactic treatment for frequent UTIs.  Saw urology 1.5 weeks ago where her urine at that time was negative for UTI.  Husband notes her urine has become extremely malodorous over the past few days.  I had a long discussion with husband about different treatment options and he prefers patient to be admitted to the hospital for IV antibiotics which I find to be reasonable given patient was having some confusion earlier. Appears to be back at baseline now. No focal deficits on exam. Low suspicion for CVA. IVFs started. Will discuss with hospitalist for admission.  8:47 AM Discussed with Dr. Waddell with [facility] who agrees to admit patient.    Lorelle Aleck BROCKS, PA-C 05/31/24 9150    Suzette Pac, MD 06/03/24 [phone]

## 2024-05-31 NOTE — Assessment & Plan Note (Signed)
 Receives injections and bloody sclera of right eye due to recent injection

## 2024-05-31 NOTE — Assessment & Plan Note (Addendum)
 Continue cymbalta

## 2024-05-31 NOTE — ED Provider Notes (Signed)
 [facility] Emergency Department Provider Note MRN:  [id number]  Arrival date & time: 06/01/24     Chief Complaint   Altered Mental Status   History of Present Illness   Morgan [name] is a 73 y.o. year-old female presents to the ED with chief complaint of foul-smelling urine and confusion.  Family reports that patient has been more altered than normal tonight.  States that this is how she behaves prior to becoming septic.  Has become septic from UTIs in the past.  Has history of Parkinson's.  States that currently, she feels fine.  Denies fever, chills, cough.  Denies any nausea, vomiting, diarrhea, constipation.  States that she does have some incontinence with urine.SABRA  History provided by patient.   Review of Systems  Pertinent positive and negative review of systems noted in HPI.    Physical Exam   Vitals:   06/01/24 0152 06/01/24 0531  BP: (!) 157/89 (!) 175/108  Pulse: 87 91  Resp: 17 18  Temp: 97.8 F (36.6 C) 98.2 F (36.8 C)  SpO2: 98% 99%    CONSTITUTIONAL:  non toxic-appearing, NAD NEURO:  Alert and oriented x 3, CN 3-12 grossly intact EYES:  eyes equal and reactive, right subconjunctival hematoma ENT/NECK:  Supple, no stridor  CARDIO:  normal rate, regular rhythm, appears well-perfused  PULM:  No respiratory distress, CTAB GI/GU:  non-distended,  MSK/SPINE:  No gross deformities, no edema, moves all extremities  SKIN:  no rash, atraumatic   *Additional and/or pertinent findings included in MDM below  Diagnostic and Interventional Summary    EKG Interpretation Date/Time:  Friday May 31 2024 04:24:05 EDT Ventricular Rate:  89 PR Interval:  178 QRS Duration:  137 QT Interval:  421 QTC Calculation: 513 R Axis:   244  Text Interpretation: Sinus rhythm Nonspecific IVCD with LAD Anterolateral infarct, old Confirmed by Jerral Meth [phone]) on 05/31/2024 4:30:57 AM       Labs Reviewed  COMPREHENSIVE METABOLIC PANEL  WITH GFR - Abnormal; Notable for the following components:      Result Value   Potassium 3.3 (*)    All other components within normal limits  URINALYSIS, W/ REFLEX TO CULTURE (INFECTION SUSPECTED) - Abnormal; Notable for the following components:   APPearance CLOUDY (*)    Ketones, ur 5 (*)    Leukocytes,Ua LARGE (*)    Bacteria, UA MANY (*)    All other components within normal limits  GLUCOSE, CAPILLARY - Abnormal; Notable for the following components:   Glucose-Capillary 109 (*)    All other components within normal limits  RESP PANEL BY RT-PCR (RSV, FLU A&B, COVID)  RVPGX2  CULTURE, BLOOD (ROUTINE X 2)  CULTURE, BLOOD (ROUTINE X 2)  URINE CULTURE  CBC WITH DIFFERENTIAL/PLATELET  PROTIME-INR  LACTIC ACID, PLASMA  PRO BRAIN NATRIURETIC PEPTIDE  MAGNESIUM  PROCALCITONIN  TSH  VITAMIN B12  PROCALCITONIN  BASIC METABOLIC PANEL WITH GFR  CBC    CT HEAD WO CONTRAST ( )  Final Result    DG Chest Port 1 View  Final Result      Medications  cefTRIAXone  (ROCEPHIN ) 1 g in sodium chloride  0.9 % 100 mL IVPB (has no administration in time range)  amLODipine  (NORVASC ) tablet 5 mg (5 mg Oral Given 05/31/24 1026)  aspirin  EC tablet 81 mg (81 mg Oral Given 05/31/24 1026)  atorvastatin  (LIPITOR) tablet 10 mg (10 mg Oral Given by Other 05/31/24 1027)  DULoxetine  (CYMBALTA ) DR capsule 60 mg (60 mg Oral  Given by Other 05/31/24 1027)  raloxifene  (EVISTA ) tablet 60 mg (60 mg Oral Given 05/31/24 1214)  magnesium oxide (MAG-OX) tablet 400 mg (400 mg Oral Given 05/31/24 2106)  mirabegron ER (MYRBETRIQ) tablet 25 mg (25 mg Oral Given 05/31/24 1214)  Carbidopa -Levodopa  ER (SINEMET  CR) 25-100 MG tablet controlled release 1 tablet (1 tablet Oral Given 05/31/24 2105)  methocarbamol (ROBAXIN) tablet 750 mg (has no administration in time range)  multivitamin (PROSIGHT) tablet 1 tablet (1 tablet Oral Given 05/31/24 2106)  artificial tears ophthalmic solution 1 drop (has no administration in time range)   enoxaparin  (LOVENOX ) injection 40 mg (40 mg Subcutaneous Given 05/31/24 1032)  ibuprofen  (ADVIL ) tablet 400 mg (400 mg Oral Given 05/31/24 2209)  ondansetron  (ZOFRAN ) tablet 4 mg (has no administration in time range)    Or  ondansetron  (ZOFRAN ) injection 4 mg (has no administration in time range)  carbidopa -levodopa  (SINEMET  IR) 25-100 MG per tablet immediate release 0.5 tablet (0.5 tablets Oral Given 06/01/24 0616)  docusate sodium (COLACE) capsule 100 mg (100 mg Oral Given 05/31/24 2106)  cefTRIAXone  (ROCEPHIN ) 1 g in sodium chloride  0.9 % 100 mL IVPB (0 g Intravenous Stopped 05/31/24 0829)  sodium chloride  0.9 % bolus 1,000 mL (0 mLs Intravenous Stopped 05/31/24 1151)  potassium chloride  SA (KLOR-CON  M) CR tablet 20 mEq (20 mEq Oral Given 05/31/24 1027)     Procedures  /  Critical Care Procedures  ED Course and Medical Decision Making  I have reviewed the triage vital signs, the nursing notes, and pertinent available records from the EMR.  Social Determinants Affecting Complexity of Care: Patient has no clinically significant social determinants affecting this chief complaint..   ED Course: Clinical Course as of 06/01/24 0619  Fri May 31, 2024  0703 Leukocytes,Ua(!): LARGE [CA]  0703 WBC, UA: >50 [CA]  0703 Bacteria, UA(!): MANY [CA]    Clinical Course User Index [CA] Lorelle Aleck BROCKS, PA-C    Medical Decision Making Patient brought in with increased episodes of confusion as well as foul-smelling urine.  Family is concerned about UTI and/or sepsis.  Patient has history of the same.  She also has history of Parkinson's.  No significant leukocytosis or anemia.  Lactic is normal at 1.4.  Chest x-ray shows questionable edema, but no obvious infiltrate.  CT head shows no evidence of acute stroke.  Urinalysis is still pending.  Will continue to monitor.  Amount and/or Complexity of Data Reviewed Labs: ordered. Decision-making details documented in ED Course. Radiology:  ordered.  Risk Decision regarding hospitalization.         Consultants:    Treatment and Plan: Patient signed out to oncoming team, who will continue care.      Final Clinical Impressions(s) / ED Diagnoses     ICD-10-CM   1. Altered mental status, unspecified altered mental status type  R41.82     2. Acute cystitis without hematuria  N30.00       ED Discharge Orders     None         Discharge Instructions Discussed with and Provided to Patient:   Discharge Instructions   None      Vicky Charleston, PA-C 06/01/24 9380    Jerral Meth, MD 06/07/24 2253

## 2024-05-31 NOTE — ED Notes (Signed)
 Assisted patient to bedside commode with help of tech Dorothyann and returned patient to bed. Dressed out in gown and belongings put in bag in chair beside bed.

## 2024-05-31 NOTE — Assessment & Plan Note (Addendum)
 73 year old presenting to ED with acute onset of confusion and lethargy found to have UTI -obs to tele -no SIRS criteria, lactic acid wnl  -received 1L IVF in ED -urine cx/blood cx pending -continue rocephin   -trend PCT  -hx of recurrent UTIs followed by urology, prophylactic keflex  stopped about one month ago  -AMS improved and secondary to UTI, continue above treatment  -head CT with no acute findings, metabolic labs pending

## 2024-05-31 NOTE — Assessment & Plan Note (Signed)
Stable, continue to trend

## 2024-05-31 NOTE — Assessment & Plan Note (Addendum)
 Delirium and fall precautions  Continue sinemet   PT eval

## 2024-05-31 NOTE — ED Notes (Addendum)
 Pt's husband wishes to speak with hospitalist doctor when they come and evaluate pt via phone if he is not there. [phone]

## 2024-05-31 NOTE — ED Triage Notes (Signed)
 Patient arrived with family who states patients urine has a strong odor and seemed more altered than normal tonight. HX of sepsis.

## 2024-05-31 NOTE — H&P (Signed)
 History and Physical    Patient: Morgan [name] FMW:[id number] DOB: 1951-06-25 DOA: 05/31/2024 DOS: the patient was seen and examined on 05/31/2024 PCP: [name], MD  Patient coming from: Home - lives with her husband. She ambulates independently but uses walker/cane when needed.    Chief Complaint: AMS  HPI: Morgan [name] is a 73 y.o. female with medical history significant of anxiety, PD, HLD, HTN, LBBB, hx of melanoma, MV and LBBB, neuropathy, CKD stage 2 and orthostatic hypotension who presented to ED for confusion and fatigue. Last night husband had a hard time waking her up. It took a little over 5 minutes to wake her up and take her to the bathroom. She had some confusion. This was abnormal. After he gave her water and her parkinson's medication she seemed to improve. She denies any fever/chills, dysuria. Has urgency and frequency, but states this is normal for her. Denies any CVA or abdominal tenderness. Urine still has odor.   She has history of recurrent UTI and followed by urology. They just took her off prophylactic keflex  about 1 month ago. Saw urology 2 weeks ago and had a urinary odor. They checked a sample and no infection at that time. They talked about other medication options. Can't recall what they started, but hasn't started this medication yet.    She has been feeling good. Denies any fever/chills, vision changes/headaches, chest pain or palpitations, shortness of breath or cough, abdominal pain, N/V/D, dysuria or leg swelling.    She does not smoke or drink alcohol .   ER Course:  vitals: afebrile, bp: 181/103, HR: 87, RR: 13, oxygen: 94%RA Pertinent labs: potassium: 3.3, UA suspicious for infection with >50 WBC, large LE, many bacteria,  CXR: low volume film with vascular congestion and subtle background interstitial opacity raising question of edema CT head: no acute finding. Atrophy with chronic small vessel ischemic disease In ED: given 1L IVF bolus,  rocephin . BC/urine cx obtained. TRH asked to admit.    Review of Systems: As mentioned in the history of present illness. All other systems reviewed and are negative. Past Medical History:  Diagnosis Date   Anxiety    Fibromyalgia    Headache    Hypercholesteremia    controlled on medication   Hyperlipidemia    Hypertension    no current meds   LBBB (left bundle branch block)    Melanoma (HCC)    MVP (mitral valve prolapse)    Osteopenia    Parkinson disease (HCC)    Pneumonia    SVD (spontaneous vaginal delivery)    x 2   Past Surgical History:  Procedure Laterality Date   BLEPHAROPLASTY     cold knife conization     COLONOSCOPY     FH colon CA   CYSTOSCOPY W/ URETERAL STENT PLACEMENT Left 10/18/2021   Procedure: CYSTOSCOPY WITH RETROGRADE PYELOGRAM, URETERAL STENT PLACEMENT;  Surgeon: Nieves Cough, MD;  Location: [facility];  Service: Urology;  Laterality: Left;   DILATATION & CURETTAGE/HYSTEROSCOPY WITH MYOSURE N/A 02/14/2018   Procedure: ATTEMPTED DILATATION & CURETTAGE/HYSTEROSCOPY WITH MYOSURE;  Surgeon: Rosalva Sawyer, MD;  Location: [facility];  Service: Gynecology;  Laterality: N/A;  polyp   melanoma cancer     removed from left leg   RECONSTRUCTION OF EYELID     TONSILLECTOMY     TUBAL LIGATION     URETEROSCOPY WITH HOLMIUM LASER LITHOTRIPSY Left 12/17/2021   Procedure: URETEROSCOPY/HOLMIUM LASER/STENT EXCHANGE;  Surgeon: Nieves Cough, MD;  Location: [facility];  Service: Urology;  Laterality: Left;   WISDOM TOOTH EXTRACTION     Social History:  reports that she quit smoking about 26 years ago. Her smoking use included cigarettes. She has never used smokeless tobacco. She reports current alcohol  use. She reports that she does not use drugs.  Allergies  Allergen Reactions   Other Anaphylaxis    Anti Seizure Medications   Phenergan [Promethazine Hcl] Other (See Comments)    Makes Parkinson's worse   Acetaminophen  Other (See Comments)    I just get weird    Itraconazole Hives    Other Reaction(s): Unknown   Promethazine    Tamsulosin  Other (See Comments)    B/P DROPPED TOO MUCH!!    Family History  Problem Relation Age of Onset   Cancer Mother        colon   Healthy Brother    Healthy Sister    Healthy Child     Prior to Admission medications   Medication Sig Start Date End Date Taking? Authorizing Provider  ALPRAZolam (XANAX) 0.25 MG tablet Take 0.25 mg by mouth daily as needed for anxiety.  Patient not taking: Reported on 01/08/2024    [provider]  amLODipine  (NORVASC ) 2.5 MG tablet Take 1 tablet (2.5 mg total) by mouth daily. Patient taking differently: Take 5 mg by mouth daily. 01/31/23 01/08/24  Waylan Elsie PARAS, PA-C  aspirin  EC 81 MG tablet Take 81 mg by mouth daily. Swallow whole.    [provider]  atorvastatin  (LIPITOR) 10 MG tablet Take 10 mg by mouth in the morning.    [provider]  BLINK TEARS 0.25 % SOLN Place 1 drop into both eyes 3 (three) times daily as needed (for dryness).    [provider]  carbidopa -levodopa  (SINEMET  IR) 25-100 MG tablet 1 tablet every 2 hours starting at 6-7am (getting about 6-7 in per day)The medication have a score line so it can be broken. 11/22/22   [name], Asberry RAMAN, DO  Carbidopa -Levodopa  ER (SINEMET  CR) 25-100 MG tablet controlled release Take 1 tablet by mouth 3 (three) times daily. 11/13/23   [provider]  Cephalexin  250 MG tablet Take 250 mg by mouth at bedtime. 11/24/23   [provider]  Cholecalciferol (VITAMIN D3) 25 MCG (1000 UT) CHEW Chew 2,500 Units by mouth daily.    [provider]  Coenzyme Q10-Vitamin E (QUNOL ULTRA COQ10 PO) Take 1 capsule by mouth daily.    [provider]  CYMBALTA  60 MG capsule TAKE 1 CAPSULE EVERY       MORNING 10/17/22   [name], Asberry RAMAN, DO  gabapentin  (NEURONTIN ) 400 MG capsule Take 1 capsule (400 mg total) by mouth 3 (three) times daily as needed (for leg pain or  soreness). Patient not taking: Reported on 01/08/2024 05/18/22   [name], Anand D, MD  Ibuprofen  200 MG CAPS Take 2 capsules (400 mg total) by mouth every 8 (eight) hours as needed (Leg pain, headache or mild pain). 05/18/22   [name], Anand D, MD  magnesium oxide (MAG-OX) 400 MG tablet Take 400 mg by mouth at bedtime.    [provider]  methocarbamol (ROBAXIN) 750 MG tablet Take 750 mg by mouth every 6 (six) hours as needed for muscle spasms (or leg pain). 03/25/20   [provider]  midodrine  (PROAMATINE ) 2.5 MG tablet Take 1 tablet (2.5 mg total) by mouth 3 (three) times daily as needed (for systolic blood pressure less than 100). 06/10/22   Vannie Reche RAMAN,  NP  Multiple Vitamins-Minerals (PRESERVISION AREDS 2 PO) Take 1 capsule by mouth 2 (two) times daily.    [provider]  raloxifene  (EVISTA ) 60 MG tablet Take 60 mg by mouth in the morning.    [provider]  TART CHERRY PO Take 3,000 mg by mouth daily.    [provider]  Vibegron (GEMTESA) 75 MG TABS Take 75 mg by mouth at bedtime. 05/04/23   [provider]    Physical Exam: Vitals:   05/31/24 0430 05/31/24 0722 05/31/24 0723 05/31/24 0805  BP: (!) 162/96  (!) 158/91   Pulse: 83  83   Resp: 18  16   Temp:    97.9 F (36.6 C)  TempSrc:    Oral  SpO2: 97% 97% 98%    General:  Appears calm and comfortable and is in NAD Eyes:  PERRL, EOMI, normal lids, iris. Bloody right sclera  ENT:  grossly normal hearing, lips & tongue, mmm; appropriate dentition Neck:  no LAD, masses or thyromegaly; no carotid bruits Cardiovascular:  RRR, no m/r/g. No LE edema.  Respiratory:   CTA bilaterally with no wheezes/rales/rhonchi.  Normal respiratory effort. Abdomen:  soft, NT, ND, NABS Back:   normal alignment, no CVAT Skin:  no rash or induration seen on limited exam Musculoskeletal:  grossly normal tone BUE/BLE, good ROM, no bony abnormality Lower extremity:  No LE edema.  Limited foot exam  with no ulcerations.  2+ distal pulses. Psychiatric:  flat affect and speech, speech fluent and appropriate, Aox2 (baseline does not know date)  Neurologic:  CN 2-12 grossly intact, moves all extremities in coordinated fashion, sensation intact   Radiological Exams on Admission: Independently reviewed - see discussion in A/P where applicable  CT HEAD WO CONTRAST ( ) Result Date: 05/31/2024 CLINICAL DATA:  Mental status changes. History of Parkinson's disease. EXAM: CT HEAD WITHOUT CONTRAST TECHNIQUE: Contiguous axial images were obtained from the base of the skull through the vertex without intravenous contrast. RADIATION DOSE REDUCTION: This exam was performed according to the departmental dose-optimization program which includes automated exposure control, adjustment of the mA and/or kV according to patient size and/or use of iterative reconstruction technique. COMPARISON:  01/23/2024 FINDINGS: Brain: There is no evidence for acute hemorrhage, hydrocephalus, mass lesion, or abnormal extra-axial fluid collection. No definite CT evidence for acute infarction. Diffuse loss of parenchymal volume is consistent with atrophy. Patchy low attenuation in the deep hemispheric and periventricular white matter is nonspecific, but likely reflects chronic microvascular ischemic demyelination. Vascular: No hyperdense vessel or unexpected calcification. Skull: Normal. Negative for fracture or focal lesion. Sinuses/Orbits: The visualized paranasal sinuses and mastoid air cells are clear. Visualized portions of the globes and intraorbital fat are unremarkable. Other: None. IMPRESSION: 1. No acute intracranial abnormality. 2. Atrophy with chronic small vessel ischemic disease. Electronically Signed   By: Camellia Candle M.D.   On: 05/31/2024 05:21   DG Chest Port 1 View Result Date: 05/31/2024 CLINICAL DATA:  Questionable sepsis. EXAM: PORTABLE CHEST 1 VIEW COMPARISON:  11/12/2023 FINDINGS: Low lung volumes.  Cardiopericardial silhouette is at upper limits of normal for size. Vascular congestion with subtle background interstitial opacity raising the question of edema. No dense focal airspace consolidation. No pneumothorax or substantial pleural effusion. Telemetry leads overlie the chest. IMPRESSION: Low volume film with vascular congestion and subtle background interstitial opacity raising the question of edema. Electronically Signed   By: Camellia Candle M.D.   On: 05/31/2024 05:01    EKG: Independently reviewed.  NSR with rate 89; nonspecific ST changes with no evidence of acute ischemia   Labs on Admission: I have personally reviewed the available labs and imaging studies at the time of the admission.  Pertinent labs:   potassium: 3.3,  UA suspicious for infection with >50 WBC, large LE, many bacteria,   Assessment and Plan: Principal Problem:   Acute encephalopathy secondary to UTI Active Problems:   Hypokalemia   HTN (hypertension)   Parkinson disease (HCC)   Hereditary and idiopathic peripheral neuropathy   CKD (chronic kidney disease), stage II   Hyperlipidemia   Anxiety disorder   Macular degeneration   UTI (urinary tract infection)    Assessment and Plan: * Acute encephalopathy secondary to UTI 73 year old presenting to ED with acute onset of confusion and lethargy found to have UTI -obs to tele -no SIRS criteria, lactic acid wnl  -received 1L IVF in ED -urine cx/blood cx pending -continue rocephin   -trend PCT  -hx of recurrent UTIs followed by urology, prophylactic keflex  stopped about one month ago  -AMS improved and secondary to UTI, continue above treatment  -head CT with no acute findings, metabolic labs pending   Hypokalemia Mild hypokalemia Check magnesium Replete and trend   HTN (hypertension) Hx of orthostatic hypotension and all anti-HTN meds stopped back in spring Now on amlodipine  5mg , continue this and monitor pressure  Has not needed midodrine  and no  hx of falls/syncope   Parkinson disease (HCC) Delirium and fall precautions  Continue sinemet   PT eval   Hereditary and idiopathic peripheral neuropathy Continue cymbalta , no longer on gabapentin    CKD (chronic kidney disease), stage II Stable, continue to trend   Hyperlipidemia Continue lipitor 10mg  daily   Anxiety disorder Continue cymbalta    Macular degeneration Receives injections and bloody sclera of right eye due to recent injection     Advance Care Planning:   Code Status: Full Code   Consults: PT   DVT Prophylaxis: lovenox    Family Communication: husband at bedside   Severity of Illness: The appropriate patient status for this patient is OBSERVATION. Observation status is judged to be reasonable and necessary in order to provide the required intensity of service to ensure the patient's safety. The patient's presenting symptoms, physical exam findings, and initial radiographic and laboratory data in the context of their medical condition is felt to place them at decreased risk for further clinical deterioration. Furthermore, it is anticipated that the patient will be medically stable for discharge from the hospital within 2 midnights of admission.   Author: Isaiah Geralds, MD 05/31/2024 9:41 AM  For on call review [email or web address].

## 2024-05-31 NOTE — Assessment & Plan Note (Addendum)
 Hx of orthostatic hypotension and all anti-HTN meds stopped back in spring Now on amlodipine  5mg , continue this and monitor pressure  Has not needed midodrine  and no hx of falls/syncope

## 2024-05-31 NOTE — Evaluation (Addendum)
 Physical Therapy Evaluation Patient Details Name: Morgan [name] MRN: [id number] DOB: Feb 25, 1951 Today's Date: 05/31/2024  History of Present Illness  Morgan [name] is a 73 y.o. female admitted with acute encephalopathy secondary to UTI. head CT with no acute findings. PMH: anxiety, Parkinson's Disease, HLD, HTN, LBBB, hx of melanoma, MV and LBBB, neuropathy, CKD stage 2 and orthostatic hypotension  Clinical Impression  Pt admitted with above diagnosis. Pt able to state name and DOB appropriately, states current president is Nancyann, unable to state date, current location or situation. Pt states ind at baseline without AD in the home, lives with spouse, 2 story home but resides on main level; spouse arrives at end of eval and shows video of pt amb independently without AD around the home end of July 2025. On eval, pt with with decreased AROM, rigidity noted, needing increased cues for attention to task and sequencing. Pt comes to sitting edge of bed with mod A to power up into sitting on elevated gurney. Pt powers to stand with mod A and HHA, strong posteiror lean with BLE braced against bed, unable to safely pivot to [facility] with +1 as pt losing balance in static standing back to gurney. Pt assisted back to supine, min A to roll R/L for linen change with constant cues for bedrail use and LE in hooklying to assist in rolling. Spouse reports pt active in workouts and plan is to resume Monday and Thursday workouts once d/c. Recommend resume OPPT and 24/7 spouse assist as needed. Transport present to take pt up to floor with spouse accompanying. Pt currently with functional limitations due to the deficits listed below (see PT Problem List). Pt will benefit from acute skilled PT to increase their independence and safety with mobility to allow discharge.           If plan is discharge home, recommend the following: A little help with bathing/dressing/bathroom;Assistance with cooking/housework;Assist for  transportation;Help with stairs or ramp for entrance;Two people to help with walking and/or transfers   Can travel by private vehicle        Equipment Recommendations Other (comment) (TBD)  Recommendations for Other Services       Functional Status Assessment Patient has had a recent decline in their functional status and demonstrates the ability to make significant improvements in function in a reasonable and predictable amount of time.     Precautions / Restrictions Precautions Precautions: Fall Recall of Precautions/Restrictions: Impaired Restrictions Weight Bearing Restrictions Per Provider Order: No      Mobility  Bed Mobility Overal bed mobility: Needs Assistance Bed Mobility: Supine to Sit, Sit to Supine     Supine to sit: Mod assist Sit to supine: Mod assist   General bed mobility comments: rigid with mobility, assist to upright trunk and mobilize BLE to bedside, assist to prevent LOB posterior while mobilziing adn static sitting at bedside; mod A to lift BLE back into bed    Transfers Overall transfer level: Needs assistance Equipment used: 1 person hand held assist Transfers: Sit to/from Stand Sit to Stand: Mod assist, From elevated surface           General transfer comment: mod A to power up from elevated gurney, slow to rise, strong posterior lean with BLE braced against the bed, unable to safely pivot to [facility] wtih +1    Ambulation/Gait                  Stairs  Wheelchair Mobility     Tilt Bed    Modified Rankin (Stroke Patients Only)       Balance Overall balance assessment: Needs assistance Sitting-balance support: Feet unsupported, Bilateral upper extremity supported Sitting balance-Leahy Scale: Fair Sitting balance - Comments: elevated gurney, feet unsupported and propped on BUE   Standing balance support: Reliant on assistive device for balance, During functional activity, Bilateral upper extremity  supported Standing balance-Leahy Scale: Poor                               Pertinent Vitals/Pain Pain Assessment Pain Assessment: Faces Faces Pain Scale: Hurts a little bit Pain Location: both knees Pain Descriptors / Indicators: Grimacing, Guarding Pain Intervention(s): Limited activity within patient's tolerance, Monitored during session    Home Living Family/patient expects to be discharged to:: Private residence Living Arrangements: Spouse/significant other Available Help at Discharge: Family Type of Home: House Home Access: Stairs to enter Entrance Stairs-Rails: Doctor, general practice of Steps: 5-6   Home Layout: Two level;Able to live on main level with bedroom/bathroom Home Equipment: Shower seat - built in;Grab bars - tub/shower      Prior Function Prior Level of Function : Independent/Modified Independent             Mobility Comments: pt denies AD use, spouse arrives at end of eval and shows video of pt ambulatory in the home without AD ADLs Comments: spouse present to assist as needed, spouse completes household chores     Extremity/Trunk Assessment   Upper Extremity Assessment Upper Extremity Assessment: Defer to OT evaluation    Lower Extremity Assessment Lower Extremity Assessment: Generalized weakness;RLE deficits/detail;LLE deficits/detail;Difficult to assess due to impaired cognition RLE Deficits / Details: ankle AROM WFL, knee flexion in supine ~20 deg heel slide, knee flexion in sitting ~90 deg heel slide on elevated gurney, hip SLR with extensor lag RLE Sensation: WNL LLE Deficits / Details: ankle AROM WFL, knee flexion in supine ~20 deg heel slide, knee flexion in sitting ~90 deg heel slide on elevated gurney, hip SLR with extensor lag LLE Sensation: WNL    Cervical / Trunk Assessment Cervical / Trunk Assessment: Kyphotic  Communication   Communication Communication: No apparent difficulties    Cognition Arousal:  Lethargic Behavior During Therapy: Flat affect   PT - Cognitive impairments: Awareness, Attention, Initiation, Sequencing, Problem solving                       PT - Cognition Comments: pt flat, needing constant sequencing and redirection to task cues, easily distracted, decreased initiation Following commands: Impaired Following commands impaired: Follows one step commands inconsistently     Cueing       General Comments      Exercises     Assessment/Plan    PT Assessment Patient needs continued PT services  PT Problem List Decreased strength;Decreased activity tolerance;Decreased balance;Decreased mobility;Decreased cognition;Decreased knowledge of use of DME;Decreased safety awareness;Decreased knowledge of precautions;Pain       PT Treatment Interventions DME instruction;Gait training;Stair training;Functional mobility training;Therapeutic activities;Therapeutic exercise;Balance training;Neuromuscular re-education;Cognitive remediation;Patient/family education    PT Goals (Current goals can be found in the Care Plan section)  Acute Rehab PT Goals Patient Stated Goal: return home, resume Mon/Thurs workouts PT Goal Formulation: With patient/family Time For Goal Achievement: 06/14/24 Potential to Achieve Goals: Good    Frequency Min 3X/week     Co-evaluation  AM-PAC PT 6 Clicks Mobility  Outcome Measure Help needed turning from your back to your side while in a flat bed without using bedrails?: A Lot Help needed moving from lying on your back to sitting on the side of a flat bed without using bedrails?: A Lot Help needed moving to and from a bed to a chair (including a wheelchair)?: Total Help needed standing up from a chair using your arms (e.g., wheelchair or bedside chair)?: A Lot Help needed to walk in hospital room?: Total Help needed climbing 3-5 steps with a railing? : Total 6 Click Score: 9    End of Session Equipment  Utilized During Treatment: Gait belt Activity Tolerance: Other (comment) Patient left: in bed;with family/visitor present;Other (comment) (transport staff taking pt to room on the floor) Nurse Communication: Mobility status;Other (comment) (BP) PT Visit Diagnosis: Unsteadiness on feet (R26.81);Other abnormalities of gait and mobility (R26.89);Muscle weakness (generalized) (M62.81);Other symptoms and signs involving the nervous system (R29.898)    Time: 8573-8543 PT Time Calculation (min) (ACUTE ONLY): 30 min   Charges:   PT Evaluation $PT Eval Moderate Complexity: 1 Mod PT Treatments $Therapeutic Activity: 8-22 mins PT General Charges $$ ACUTE PT VISIT: 1 Visit         Tori Ameen Mostafa PT, DPT 05/31/24, 3:09 PM

## 2024-06-01 DIAGNOSIS — G9340 Encephalopathy, unspecified: Secondary | ICD-10-CM | POA: Diagnosis not present

## 2024-06-01 LAB — CBC
HCT: 40.2 % (ref 36.0–46.0)
Hemoglobin: 12.9 g/dL (ref 12.0–15.0)
MCH: 29.1 pg (ref 26.0–34.0)
MCHC: 32.1 g/dL (ref 30.0–36.0)
MCV: 90.5 fL (ref 80.0–100.0)
Platelets: 242 K/uL (ref 150–400)
RBC: 4.44 MIL/uL (ref 3.87–5.11)
RDW: 13.4 % (ref 11.5–15.5)
WBC: 7.9 K/uL (ref 4.0–10.5)
nRBC: 0 % (ref 0.0–0.2)

## 2024-06-01 LAB — PROCALCITONIN: Procalcitonin: <0.1 ng/mL

## 2024-06-01 LAB — BASIC METABOLIC PANEL WITH GFR
Anion gap: 13 (ref 5–15)
BUN: 12 mg/dL (ref 8–23)
CO2: 25 mmol/L (ref 22–32)
Calcium: 9.4 mg/dL (ref 8.9–10.3)
Chloride: 101 mmol/L (ref 98–111)
Creatinine, Ser: 0.67 mg/dL (ref 0.44–1.00)
GFR, Estimated: >60 mL/min (ref >60)
Glucose, Bld: 100 mg/dL — ABNORMAL HIGH (ref 70–99)
Potassium: 3.2 mmol/L — ABNORMAL LOW (ref 3.5–5.1)
Sodium: 138 mmol/L (ref 135–145)

## 2024-06-01 NOTE — Plan of Care (Signed)
  Problem: Pain Managment: Goal: General experience of comfort will improve and/or be controlled Outcome: Progressing   Problem: Safety: Goal: Ability to remain free from injury will improve Outcome: Progressing

## 2024-06-01 NOTE — Progress Notes (Signed)
 PROGRESS NOTE    JALINE PINCOCK  FMW:[id number] DOB: [birth year] DOA: 05/31/2024 PCP: [name], MD   Brief Narrative:  This 73 yrs old female with PMH significant for anxiety, hypertension, history of melanoma, hyperlipidemia, PD, history of LBBB, neuropathy, CKD stage II, orthostatic hypotension presented in the ED with fatigue and confusion.  Patient was found to be altered, husband reports history of recurrent UTI and been following up with urology.  They just took her off from prophylactic Keflex  about a month ago.  She has seen urology 2 weeks ago and had a urinary order but there was no urinary tract infection.  In the ED UA suspicious for infection.  CT head no acute findings.  Patient was admitted for acute encephalopathy likely due to UTI , started on empiric antibiotics.  Assessment & Plan:   Principal Problem:   Acute encephalopathy secondary to UTI Active Problems:   Hypokalemia   HTN (hypertension)   Parkinson disease (HCC)   Hereditary and idiopathic peripheral neuropathy   CKD (chronic kidney disease), stage II   Hyperlipidemia   Anxiety disorder   Macular degeneration   UTI (urinary tract infection)  Acute encephalopathy secondary to UTI: She presented with confusion and lethargy found to have UTI. Lactic acid normal, No SIRS criteria met. Initiated on empiric IV antibiotics Rocephin . Continue IV fluid resuscitation. Procalcitonin 0.10. CT head. No acute abnormality, metabolic panel within normal limits. She has hx. of recurrent UTIs followed by urology, prophylactic keflex  stopped about one month ago  AMS improved, back to baseline mental status.  Hypokalemia: Replaced.  Continue to monitor   HTN (hypertension): Hx of orthostatic hypotension and all anti-HTN meds stopped back in spring. Now on amlodipine  5mg , Continue amlodipine  5 mg  Has not needed midodrine  and no hx of falls/syncope.   Parkinson disease (HCC) Delirium and fall  precautions. Continue sinemet  . PT/OT eval    Hereditary and idiopathic peripheral neuropathy: Continue cymbalta , No longer on gabapentin     CKD (chronic kidney disease), stage II Stable, serum creatinine at baseline.   Hyperlipidemia: Continue lipitor 10mg  daily.   Anxiety disorder: Continue cymbalta  .   Macular degeneration: Receives injections and bloody sclera of right eye due to recent injection.   DVT prophylaxis: Lovenox  Code Status: Full code Family Communication: No family at bedside Disposition Plan:   Status is: Inpatient Remains inpatient appropriate because: Admitted for altered mental status likely due to UTI.  Consultants:  None  Procedures: CT head Antimicrobials:  Anti-infectives (From admission, onward)    Start     Dose/Rate Route Frequency Ordered Stop   06/01/24 1000  cefTRIAXone  (ROCEPHIN ) 1 g in sodium chloride  0.9 % 100 mL IVPB        1 g 200 mL/hr over 30 Minutes Intravenous Every 24 hours 05/31/24 0844     05/31/24 0715  cefTRIAXone  (ROCEPHIN ) 1 g in sodium chloride  0.9 % 100 mL IVPB        1 g 200 mL/hr over 30 Minutes Intravenous  Once 05/31/24 0704 05/31/24 0829      Subjective: Patient was seen and examined at bedside. Overnight events noted. Patient reports feeling better, She seems back to her baseline mental status.   She still reports burning urination.  She is on IV antibiotics for UTI.  Objective: Vitals:   05/31/24 1654 05/31/24 2011 06/01/24 0152 06/01/24 0531  BP: 133/88 (!) 143/87 (!) 157/89 (!) 175/108  Pulse: 87 90 87 91  Resp: 20 18 17 18   Temp:  99 F (37.2 C) 98.7 F (37.1 C) 97.8 F (36.6 C) 98.2 F (36.8 C)  TempSrc: Oral     SpO2: 94% 94% 98% 99%  Weight: 74 kg     Height: 5' 5 (1.651 m)       Intake/Output Summary (Last 24 hours) at 06/01/2024 1300 Last data filed at 06/01/2024 0900 Gross per 24 hour  Intake 240 ml  Output 500 ml  Net -260 ml   Filed Weights   05/31/24 1654  Weight: 74 kg     Examination:  General exam: Appears calm and comfortable, deconditioned, not in any acute distress. Respiratory system: Clear to auscultation. Respiratory effort normal.  RR 13 Cardiovascular system: S1 & S2 heard, RRR. No JVD, murmurs, rubs, gallops or clicks.  Gastrointestinal system: Abdomen is non distended, soft and non tender.  Normal bowel sounds heard. Central nervous system: Alert and oriented X 2. No focal neurological deficits. Extremities: No edema, no cyanosis, no clubbing. Skin: No rashes, lesions or ulcers Psychiatry: Judgement and insight appear normal. Mood & affect appropriate.     Data Reviewed: I have personally reviewed following labs and imaging studies  CBC: Recent Labs  Lab 05/31/24 0432 06/01/24 0613  WBC 7.7 7.9  NEUTROABS 5.7  --   HGB 12.9 12.9  HCT 42.5 40.2  MCV 91.6 90.5  PLT 245 242   Basic Metabolic Panel: Recent Labs  Lab 05/31/24 0432 05/31/24 1037 06/01/24 0613  NA 141  --  138  K 3.3*  --  3.2*  CL 102  --  101  CO2 27  --  25  GLUCOSE 99  --  100*  BUN 18  --  12  CREATININE 0.91  --  0.67  CALCIUM  9.1  --  9.4  MG  --  2.2  --    GFR: Estimated Creatinine Clearance: 61.2 mL/min (by C-G formula based on SCr of 0.67 mg/dL). Liver Function Tests: Recent Labs  Lab 05/31/24 0432  AST 23  ALT <5  ALKPHOS 107  BILITOT 0.4  PROT 7.3  ALBUMIN 4.3   No results for input(s): LIPASE, AMYLASE in the last 168 hours. No results for input(s): AMMONIA in the last 168 hours. Coagulation Profile: Recent Labs  Lab 05/31/24 0432  INR 1.0   Cardiac Enzymes: No results for input(s): CKTOTAL, CKMB, CKMBINDEX, TROPONINI in the last 168 hours. BNP (last 3 results) Recent Labs    05/31/24 0432  PROBNP 258.0   HbA1C: No results for input(s): HGBA1C in the last 72 hours. CBG: Recent Labs  Lab 05/31/24 2100  GLUCAP 109*   Lipid Profile: No results for input(s): CHOL, HDL, LDLCALC, TRIG, CHOLHDL,  LDLDIRECT in the last 72 hours. Thyroid  Function Tests: Recent Labs    05/31/24 1037  TSH 2.130   Anemia Panel: Recent Labs    05/31/24 1037  VITAMINB12 913   Sepsis Labs: Recent Labs  Lab 05/31/24 0439 05/31/24 1037 06/01/24 0613  PROCALCITON  --  <0.10 <0.10  LATICACIDVEN 1.4  --   --     Recent Results (from the past 240 hours)  Blood Culture (routine x 2)     Status: None (Preliminary result)   Collection Time: 05/31/24  4:32 AM   Specimen: BLOOD  Result Value Ref Range Status   Specimen Description   Final    BLOOD RIGHT ANTECUBITAL Performed at [facility], [address], [place], [place] [address]    Special Requests   Final  Blood Culture adequate volume BOTTLES DRAWN AEROBIC AND ANAEROBIC Performed at [facility], [address] [place], [place] [address]    Culture   Final    NO GROWTH 1 DAY Performed at [facility], [address] [place], [place] [address]    Report Status PENDING  Incomplete  Blood Culture (routine x 2)     Status: None (Preliminary result)   Collection Time: 05/31/24  4:51 AM   Specimen: BLOOD  Result Value Ref Range Status   Specimen Description   Final    BLOOD LEFT ANTECUBITAL Performed at [facility], [address] [place], [place] [address]    Special Requests   Final    Blood Culture adequate volume BOTTLES DRAWN AEROBIC AND ANAEROBIC Performed at [facility], [address], [place], [place] [address]    Culture   Final    NO GROWTH 1 DAY Performed at [facility], [address], [place], [place] [address]    Report Status PENDING  Incomplete  Urine Culture     Status: Abnormal (Preliminary result)   Collection Time: 05/31/24  5:30 AM   Specimen: Urine, Random  Result Value Ref Range Status   Specimen Description   Final    URINE, RANDOM Performed at [facility], [address],  [place], [place] [address]    Special Requests   Final    NONE Reflexed from [id number] Performed at [facility], [address], [place], [place] [address]    Culture (A)  Final    >=100,000 COLONIES/mL ESCHERICHIA COLI SUSCEPTIBILITIES TO FOLLOW Performed at [facility], [address] [place], [place] [address]    Report Status PENDING  Incomplete  Resp panel by RT-PCR (RSV, Flu A&B, Covid) Anterior Nasal Swab     Status: None   Collection Time: 05/31/24  6:04 AM   Specimen: Anterior Nasal Swab  Result Value Ref Range Status   SARS Coronavirus 2 by RT PCR NEGATIVE NEGATIVE Final    Comment: (NOTE) SARS-CoV-2 target nucleic acids are NOT DETECTED.  The SARS-CoV-2 RNA is generally detectable in upper respiratory specimens during the acute phase of infection. The lowest concentration of SARS-CoV-2 viral copies this assay can detect is 138 copies/mL. A negative result does not preclude SARS-Cov-2 infection and should not be used as the sole basis for treatment or other patient management decisions. A negative result may occur with  improper specimen collection/handling, submission of specimen other than nasopharyngeal swab, presence of viral mutation(s) within the areas targeted by this assay, and inadequate number of viral copies(<138 copies/mL). A negative result must be combined with clinical observations, patient history, and epidemiological information. The expected result is Negative.  Fact Sheet for Patients:  BloggerCourse.com  Fact Sheet for Healthcare Providers:  SeriousBroker.it  This test is no t yet approved or cleared by the [place]  FDA and  has been authorized for detection and/or diagnosis of SARS-CoV-2 by FDA under an Emergency Use Authorization (EUA). This EUA will remain  in effect (meaning this test can be used) for the duration of the COVID-19 declaration under Section 564(b)(1) of  the Act, 21 U.S.C.section 360bbb-3(b)(1), unless the authorization is terminated  or revoked sooner.       Influenza A by PCR NEGATIVE NEGATIVE Final   Influenza B by PCR NEGATIVE NEGATIVE Final    Comment: (NOTE) The Xpert Xpress SARS-CoV-2/FLU/RSV plus assay is intended as an  aid in the diagnosis of influenza from Nasopharyngeal swab specimens and should not be used as a sole basis for treatment. Nasal washings and aspirates are unacceptable for Xpert Xpress SARS-CoV-2/FLU/RSV testing.  Fact Sheet for Patients: BloggerCourse.com  Fact Sheet for Healthcare Providers: SeriousBroker.it  This test is not yet approved or cleared by the [place]  FDA and has been authorized for detection and/or diagnosis of SARS-CoV-2 by FDA under an Emergency Use Authorization (EUA). This EUA will remain in effect (meaning this test can be used) for the duration of the COVID-19 declaration under Section 564(b)(1) of the Act, 21 U.S.C. section 360bbb-3(b)(1), unless the authorization is terminated or revoked.     Resp Syncytial Virus by PCR NEGATIVE NEGATIVE Final    Comment: (NOTE) Fact Sheet for Patients: BloggerCourse.com  Fact Sheet for Healthcare Providers: SeriousBroker.it  This test is not yet approved or cleared by the [place]  FDA and has been authorized for detection and/or diagnosis of SARS-CoV-2 by FDA under an Emergency Use Authorization (EUA). This EUA will remain in effect (meaning this test can be used) for the duration of the COVID-19 declaration under Section 564(b)(1) of the Act, 21 U.S.C. section 360bbb-3(b)(1), unless the authorization is terminated or revoked.  Performed at [facility], [address] [place], [place] [address]     Radiology Studies: CT HEAD WO CONTRAST ( ) Result Date: 05/31/2024 CLINICAL DATA:  Mental status changes.  History of Parkinson's disease. EXAM: CT HEAD WITHOUT CONTRAST TECHNIQUE: Contiguous axial images were obtained from the base of the skull through the vertex without intravenous contrast. RADIATION DOSE REDUCTION: This exam was performed according to the departmental dose-optimization program which includes automated exposure control, adjustment of the mA and/or kV according to patient size and/or use of iterative reconstruction technique. COMPARISON:  01/23/2024 FINDINGS: Brain: There is no evidence for acute hemorrhage, hydrocephalus, mass lesion, or abnormal extra-axial fluid collection. No definite CT evidence for acute infarction. Diffuse loss of parenchymal volume is consistent with atrophy. Patchy low attenuation in the deep hemispheric and periventricular white matter is nonspecific, but likely reflects chronic microvascular ischemic demyelination. Vascular: No hyperdense vessel or unexpected calcification. Skull: Normal. Negative for fracture or focal lesion. Sinuses/Orbits: The visualized paranasal sinuses and mastoid air cells are clear. Visualized portions of the globes and intraorbital fat are unremarkable. Other: None. IMPRESSION: 1. No acute intracranial abnormality. 2. Atrophy with chronic small vessel ischemic disease. Electronically Signed   By: Camellia Candle M.D.   On: 05/31/2024 05:21   DG Chest Port 1 View Result Date: 05/31/2024 CLINICAL DATA:  Questionable sepsis. EXAM: PORTABLE CHEST 1 VIEW COMPARISON:  11/12/2023 FINDINGS: Low lung volumes. Cardiopericardial silhouette is at upper limits of normal for size. Vascular congestion with subtle background interstitial opacity raising the question of edema. No dense focal airspace consolidation. No pneumothorax or substantial pleural effusion. Telemetry leads overlie the chest. IMPRESSION: Low volume film with vascular congestion and subtle background interstitial opacity raising the question of edema. Electronically Signed   By: Camellia Candle  M.D.   On: 05/31/2024 05:01   Scheduled Meds:  amLODipine   5 mg Oral Daily   aspirin  EC  81 mg Oral Daily   atorvastatin   10 mg Oral q AM   carbidopa -levodopa   0.5 tablet Oral 5 times per day   Carbidopa -Levodopa  ER  1 tablet Oral Q8H   docusate sodium  100 mg Oral BID   DULoxetine   60 mg Oral q morning   enoxaparin  (LOVENOX ) injection  40 mg  Subcutaneous Q24H   magnesium oxide  400 mg Oral QHS   mirabegron ER  25 mg Oral Daily   multivitamin  1 tablet Oral BID   raloxifene   60 mg Oral q AM   Continuous Infusions:  cefTRIAXone  (ROCEPHIN )  IV 1 g (06/01/24 1214)     LOS: 1 day    Time spent: 50 mins    Darcel Dawley, MD [facility]   If 7PM-7AM, please contact night-coverage

## 2024-06-01 NOTE — Plan of Care (Signed)

## 2024-06-01 NOTE — Evaluation (Signed)
 Occupational Therapy Evaluation Patient Details Name: Morgan [name] MRN: [id number] DOB: 12-Aug-1951 Today's Date: 06/01/2024   History of Present Illness   Morgan [name] is a 73 y.o. female admitted with acute encephalopathy secondary to UTI. head CT with no acute findings. PMH: anxiety, Parkinson's Disease, HLD, HTN, LBBB, hx of melanoma, MV and LBBB, neuropathy, CKD stage 2 and orthostatic hypotension     Clinical Impressions Pt is from home with husband where she is typically ambulatory without DME and does her own ADL and assists with IADL per husband. She graduated from [employer] and has been working with a Systems analyst 2x/wk at [employer]. She is retired from [employer]. Today she presents with decreased cognition, decreased balance, decreased activity tolerance, and generalized weakness. Today bed mobility at mod A. With cues for fwd head she was able to come to standing with mod A. Mobilized to bathroom several times with and without RW. She does better with gait belt and HHA and cues for Take a big breath and a big step Pt able to perform front peri care, but required assist for rear peri care in standing. Pt with typical posterior balance preference but did well with cues for nose over toes Pt is currently mod to max A for LB ADL and min to set up for UB ADL. At this time recommending rehab of <3 hours daily post-acute at [facility] in addition to acute OT to maximize safety and independence in ADL and functional transfers. Next session continue OOB activity and potentially standing grooming tasks.      If plan is discharge home, recommend the following:   A lot of help with walking and/or transfers;A lot of help with bathing/dressing/bathroom;Assistance with feeding;Assistance with cooking/housework;Assist for transportation;Help with stairs or ramp for entrance;Supervision due to cognitive status;Direct supervision/assist for medications management;Direct supervision/assist for  financial management     Functional Status Assessment   Patient has had a recent decline in their functional status and demonstrates the ability to make significant improvements in function in a reasonable and predictable amount of time.     Equipment Recommendations   None recommended by OT (Pt has appropriate DME)     Recommendations for Other Services   PT consult     Precautions/Restrictions   Precautions Precautions: Fall Recall of Precautions/Restrictions: Impaired Restrictions Weight Bearing Restrictions Per Provider Order: No     Mobility Bed Mobility Overal bed mobility: Needs Assistance Bed Mobility: Supine to Sit, Sit to Supine     Supine to sit: Mod assist     General bed mobility comments: rigid with mobility, assist to upright trunk and mobilize BLE to bedside, assist to prevent LOB posterior while mobilizing and static sitting at bedside - did well with cues to bring head forward    Transfers Overall transfer level: Needs assistance Equipment used: 1 person hand held assist, Rolling walker (2 wheels) Transfers: Sit to/from Stand Sit to Stand: Mod assist, From elevated surface           General transfer comment: mod A to power up from elevated gurney, slow to rise, strong posterior lean with BLE braced against the bed, unable to safely pivot to [facility] wtih +1      Balance Overall balance assessment: Needs assistance Sitting-balance support: Feet unsupported, Bilateral upper extremity supported Sitting balance-Leahy Scale: Fair     Standing balance support: Reliant on assistive device for balance (or external assist) Standing balance-Leahy Scale: Poor  ADL either performed or assessed with clinical judgement   ADL Overall ADL's : Needs assistance/impaired Eating/Feeding: Set up;Sitting   Grooming: Set up;Wash/dry hands;Cueing for sequencing;Sitting Grooming Details (indicate cue type and reason):  assist initiating simple grooming tasks Upper Body Bathing: Moderate assistance   Lower Body Bathing: Moderate assistance   Upper Body Dressing : Minimal assistance;Sitting   Lower Body Dressing: Maximal assistance;Sit to/from stand   Toilet Transfer: Moderate assistance;Ambulation Toilet Transfer Details (indicate cue type and reason): one time with RW, seemed to do better with one person HHA and gait belt Toileting- Clothing Manipulation and Hygiene: Maximal assistance;Sit to/from stand Toileting - Clothing Manipulation Details (indicate cue type and reason): for rear peri care     Functional mobility during ADLs: Moderate assistance;Rolling walker (2 wheels) (does better with one person HHA and cues)       Vision Baseline Vision/History: 1 Wears glasses Ability to See in Adequate Light: 0 Adequate Patient Visual Report: No change from baseline Vision Assessment?: No apparent visual deficits     Perception         Praxis         Pertinent Vitals/Pain Pain Assessment Pain Assessment: Faces Faces Pain Scale: Hurts a little bit Pain Location: both knees Pain Descriptors / Indicators: Grimacing, Guarding Pain Intervention(s): Monitored during session, Repositioned     Extremity/Trunk Assessment Upper Extremity Assessment Upper Extremity Assessment: Generalized weakness   Lower Extremity Assessment Lower Extremity Assessment: Defer to PT evaluation   Cervical / Trunk Assessment Cervical / Trunk Assessment: Kyphotic   Communication Communication Communication: No apparent difficulties   Cognition Arousal: Lethargic Behavior During Therapy: Flat affect Cognition: Cognition impaired (decreased from baseline)   Orientation impairments: Time, Situation Awareness: Intellectual awareness impaired, Online awareness impaired   Attention impairment (select first level of impairment): Focused attention Executive functioning impairment (select all impairments):  Initiation, Organization, Sequencing, Reasoning, Problem solving OT - Cognition Comments: not at baseline, follows one step commands - but needs assist with initiation                 Following commands: Impaired Following commands impaired: Follows one step commands inconsistently     Cueing  General Comments   Cueing Techniques: Verbal cues;Tactile cues  Husband [name] present and supportive throughout session   Exercises     Shoulder Instructions      Home Living Family/patient expects to be discharged to:: Private residence Living Arrangements: Spouse/significant other Available Help at Discharge: Family Type of Home: House Home Access: Stairs to enter [employer] of Steps: 5-6 Entrance Stairs-Rails: Right;Left Home Layout: Two level;Able to live on main level with bedroom/bathroom     Bathroom Shower/Tub: Producer, television/film/video: Handicapped height Bathroom Accessibility: Yes How Accessible: Accessible via wheelchair Home Equipment: Shower seat - built in;Grab bars - tub/shower;Hand held shower head   Additional Comments: addition on house with fully roll in shower - completely handicapped accessible including re-enforced rafters so if he needs to he can install lift      Prior Functioning/Environment Prior Level of Function : Independent/Modified Independent             Mobility Comments: pt denies AD use, spouse arrives at end of eval and shows video of pt ambulatory in the home without AD ADLs Comments: spouse present to assist as needed, spouse completes household chores    OT Problem List: Decreased strength;Decreased activity tolerance;Impaired balance (sitting and/or standing);Decreased cognition   OT Treatment/Interventions: Self-care/ADL training;Energy conservation;DME and/or  AE instruction;Therapeutic activities;Patient/family education;Balance training      OT Goals(Current goals can be found in the care plan section)    Acute Rehab OT Goals Patient Stated Goal: get stronger OT Goal Formulation: With family Time For Goal Achievement: 06/15/24 Potential to Achieve Goals: Good ADL Goals Pt Will Perform Grooming: with supervision;standing Pt Will Perform Upper Body Dressing: with supervision;sitting Pt Will Perform Lower Body Dressing: with contact guard assist;sit to/from stand Pt Will Transfer to Toilet: with contact guard assist;ambulating Pt Will Perform Toileting - Clothing Manipulation and hygiene: with supervision;sitting/lateral leans;sit to/from stand Additional ADL Goal #1: Pt will maintain seated balance for ADL for at least 5 min at supervision level   OT Frequency:  Min 2X/week    Co-evaluation              AM-PAC OT 6 Clicks Daily Activity     Outcome Measure Help from another person eating meals?: None Help from another person taking care of personal grooming?: A Little Help from another person toileting, which includes using toliet, bedpan, or urinal?: A Lot Help from another person bathing (including washing, rinsing, drying)?: A Lot Help from another person to put on and taking off regular upper body clothing?: A Little Help from another person to put on and taking off regular lower body clothing?: A Lot 6 Click Score: 16   End of Session Equipment Utilized During Treatment: Gait belt;Rolling walker (2 wheels) Nurse Communication: Mobility status  Activity Tolerance: Patient tolerated treatment well Patient left: in chair;with call bell/phone within reach;with family/visitor present;with nursing/sitter in room  OT Visit Diagnosis: Unsteadiness on feet (R26.81);Other abnormalities of gait and mobility (R26.89);Muscle weakness (generalized) (M62.81);Other symptoms and signs involving cognitive function                Time: 8897-8782 OT Time Calculation (min): 75 min Charges:  OT General Charges $OT Visit: 1 Visit OT Evaluation $OT Eval Moderate Complexity: 1 Mod OT  Treatments $Self Care/Home Management : 38-52 mins $Therapeutic Activity: 8-22 mins  Leita DEL OTR/L Acute Rehabilitation Services Office: [phone]  Leita JINNY Odea 06/01/2024, 1:42 PM

## 2024-06-02 DIAGNOSIS — G9340 Encephalopathy, unspecified: Secondary | ICD-10-CM | POA: Diagnosis not present

## 2024-06-02 LAB — URINE CULTURE: Culture: >=100000 — AB

## 2024-06-02 LAB — CBC
HCT: 40.8 % (ref 36.0–46.0)
Hemoglobin: 12.6 g/dL (ref 12.0–15.0)
MCH: 27.9 pg (ref 26.0–34.0)
MCHC: 30.9 g/dL (ref 30.0–36.0)
MCV: 90.5 fL (ref 80.0–100.0)
Platelets: 243 K/uL (ref 150–400)
RBC: 4.51 MIL/uL (ref 3.87–5.11)
RDW: 13.7 % (ref 11.5–15.5)
WBC: 7.4 K/uL (ref 4.0–10.5)
nRBC: 0 % (ref 0.0–0.2)

## 2024-06-02 LAB — BASIC METABOLIC PANEL WITH GFR
Anion gap: 11 (ref 5–15)
BUN: 20 mg/dL (ref 8–23)
CO2: 26 mmol/L (ref 22–32)
Calcium: 9.7 mg/dL (ref 8.9–10.3)
Chloride: 103 mmol/L (ref 98–111)
Creatinine, Ser: 0.93 mg/dL (ref 0.44–1.00)
GFR, Estimated: >60 mL/min (ref >60)
Glucose, Bld: 109 mg/dL — ABNORMAL HIGH (ref 70–99)
Potassium: 3.7 mmol/L (ref 3.5–5.1)
Sodium: 141 mmol/L (ref 135–145)

## 2024-06-02 LAB — MAGNESIUM: Magnesium: 2.4 mg/dL (ref 1.7–2.4)

## 2024-06-02 LAB — PHOSPHORUS: Phosphorus: 3.8 mg/dL (ref 2.5–4.6)

## 2024-06-02 MED ORDER — ENSURE PLUS HIGH PROTEIN PO LIQD
237.0000 mL | Freq: Two times a day (BID) | ORAL | Status: DC
  Administered 2024-06-02 – 2024-06-03 (×2): 237 mL via ORAL

## 2024-06-02 NOTE — Progress Notes (Signed)
 PROGRESS NOTE    Morgan [name]  FMW:[id number] DOB: 10/02/1950 DOA: 05/31/2024 PCP: [name], MD   Brief Narrative:  This 73 yrs old female with PMH significant for anxiety, hypertension, history of melanoma, hyperlipidemia, PD, history of LBBB, neuropathy, CKD stage II, orthostatic hypotension presented in the ED with fatigue and confusion.  Patient was found to be altered, husband reports history of recurrent UTI and been following up with urology.  They just took her off from prophylactic Keflex  about a month ago.  She has seen urology 2 weeks ago and had a urinary order but there was no urinary tract infection.  In the ED UA suspicious for infection.  CT head no acute findings.  Patient was admitted for acute encephalopathy likely due to UTI , started on empiric antibiotics.  Assessment & Plan:   Principal Problem:   Acute encephalopathy secondary to UTI Active Problems:   Hypokalemia   HTN (hypertension)   Parkinson disease (HCC)   Hereditary and idiopathic peripheral neuropathy   CKD (chronic kidney disease), stage II   Hyperlipidemia   Anxiety disorder   Macular degeneration   UTI (urinary tract infection)  # Acute encephalopathy secondary to UTI: She presented with confusion and lethargy found to have UTI. Lactic acid normal, No SIRS criteria met. S/p IVF, Procalcitonin 0.10 Continue ceftriaxone  1 g IV daily  CT head. No acute abnormality, metabolic panel within normal limits. She has hx. of recurrent UTIs followed by urology, prophylactic keflex  stopped about one month ago  AMS improving, still has mild confusion, AO x 2 today 10/5 Urine culture growing E. Coli, resistant to Bactrim and Unasyn, sensitive to rest with antibiotics. 10/5 still patient has mild confusion and her husband does not want her to go back home and this condition, would like to stay another night and discharge plan tomorrow a.m.   Hypokalemia: Resolved Replaced.  Continue to monitor    HTN (hypertension): Hx of orthostatic hypotension and all anti-HTN meds stopped back in spring. Now on amlodipine  5mg , Continue amlodipine  5 mg  Has not needed midodrine  and no hx of falls/syncope.   Parkinson disease (HCC) Delirium and fall precautions. Continue sinemet  . PT/OT eval    Hereditary and idiopathic peripheral neuropathy: Continue cymbalta , No longer on gabapentin     CKD (chronic kidney disease), stage II Stable, serum creatinine at baseline.   Hyperlipidemia: Continue lipitor 10mg  daily.   Anxiety disorder: Continue cymbalta  .   Macular degeneration: Receives injections and bloody sclera of right eye due to recent injection.   DVT prophylaxis: Lovenox  Code Status: Full code Family Communication: No family at bedside Disposition Plan:   Status is: Inpatient Remains inpatient appropriate because: Admitted for altered mental status likely due to UTI.  Consultants:  None  Procedures: CT head Antimicrobials:  Anti-infectives (From admission, onward)    Start     Dose/Rate Route Frequency Ordered Stop   06/01/24 1000  cefTRIAXone  (ROCEPHIN ) 1 g in sodium chloride  0.9 % 100 mL IVPB        1 g 200 mL/hr over 30 Minutes Intravenous Every 24 hours 05/31/24 0844     05/31/24 0715  cefTRIAXone  (ROCEPHIN ) 1 g in sodium chloride  0.9 % 100 mL IVPB        1 g 200 mL/hr over 30 Minutes Intravenous  Once 05/31/24 0704 05/31/24 0829      Subjective: Patient was seen and examined at bedside. Overnight events noted. Patient was sitting comfortably on the recliner.  AO x 2,  still slightly confused, not back to her baseline. Denied any specific complaints. Patient's husband was sitting at bedside and he does not feel comfortable discharging her today, plan is to DC her tomorrow a.m.   Objective: Vitals:   06/02/24 0529 06/02/24 0914 06/02/24 1312 06/02/24 1315  BP: (!) 175/94 124/82 130/65   Pulse: 94 89 (!) 53   Resp:   18   Temp:  99 F (37.2 C) 98.3 F  (36.8 C)   TempSrc:      SpO2: 94% 91% (!) 83% 94%  Weight:      Height:        Intake/Output Summary (Last 24 hours) at 06/02/2024 1515 Last data filed at 06/02/2024 1300 Gross per 24 hour  Intake 940 ml  Output 550 ml  Net 390 ml   Filed Weights   05/31/24 1654  Weight: 74 kg    Examination:  General exam: Appears calm and comfortable, deconditioned, not in any acute distress. Respiratory system: Clear to auscultation. Respiratory effort normal.   Cardiovascular system: S1 & S2 heard, RRR. No JVD, murmurs, rubs, gallops or clicks.  Gastrointestinal system: Abdomen is non distended, soft and non tender.  Normal bowel sounds heard. Central nervous system: Alert and oriented X 2. No focal neurological deficits. Extremities: No edema, no cyanosis, no clubbing. Skin: No rashes, lesions or ulcers Psychiatry: Judgement and insight appear normal. Mood & affect appropriate.     Data Reviewed: I have personally reviewed following labs and imaging studies  CBC: Recent Labs  Lab 05/31/24 0432 06/01/24 0613 06/02/24 0519  WBC 7.7 7.9 7.4  NEUTROABS 5.7  --   --   HGB 12.9 12.9 12.6  HCT 42.5 40.2 40.8  MCV 91.6 90.5 90.5  PLT 245 242 243   Basic Metabolic Panel: Recent Labs  Lab 05/31/24 0432 05/31/24 1037 06/01/24 0613 06/02/24 0519  NA 141  --  138 141  K 3.3*  --  3.2* 3.7  CL 102  --  101 103  CO2 27  --  25 26  GLUCOSE 99  --  100* 109*  BUN 18  --  12 20  CREATININE 0.91  --  0.67 0.93  CALCIUM  9.1  --  9.4 9.7  MG  --  2.2  --  2.4  PHOS  --   --   --  3.8   GFR: Estimated Creatinine Clearance: 52.6 mL/min (by C-G formula based on SCr of 0.93 mg/dL). Liver Function Tests: Recent Labs  Lab 05/31/24 0432  AST 23  ALT <5  ALKPHOS 107  BILITOT 0.4  PROT 7.3  ALBUMIN 4.3   No results for input(s): LIPASE, AMYLASE in the last 168 hours. No results for input(s): AMMONIA in the last 168 hours. Coagulation Profile: Recent Labs  Lab  05/31/24 0432  INR 1.0   Cardiac Enzymes: No results for input(s): CKTOTAL, CKMB, CKMBINDEX, TROPONINI in the last 168 hours. BNP (last 3 results) Recent Labs    05/31/24 0432  PROBNP 258.0   HbA1C: No results for input(s): HGBA1C in the last 72 hours. CBG: Recent Labs  Lab 05/31/24 2100  GLUCAP 109*   Lipid Profile: No results for input(s): CHOL, HDL, LDLCALC, TRIG, CHOLHDL, LDLDIRECT in the last 72 hours. Thyroid  Function Tests: Recent Labs    05/31/24 1037  TSH 2.130   Anemia Panel: Recent Labs    05/31/24 1037  VITAMINB12 913   Sepsis Labs: Recent Labs  Lab 05/31/24 0439 05/31/24 1037 06/01/24 9386  PROCALCITON  --  <0.10 <0.10  LATICACIDVEN 1.4  --   --     Recent Results (from the past 240 hours)  Blood Culture (routine x 2)     Status: None (Preliminary result)   Collection Time: 05/31/24  4:32 AM   Specimen: BLOOD  Result Value Ref Range Status   Specimen Description   Final    BLOOD RIGHT ANTECUBITAL Performed at [facility], [address], [place], [place] [address]    Special Requests   Final    Blood Culture adequate volume BOTTLES DRAWN AEROBIC AND ANAEROBIC Performed at [facility], [address] [place], [place] [address]    Culture   Final    NO GROWTH 2 DAYS Performed at [facility], [address], [place], [place] [address]    Report Status PENDING  Incomplete  Blood Culture (routine x 2)     Status: None (Preliminary result)   Collection Time: 05/31/24  4:51 AM   Specimen: BLOOD  Result Value Ref Range Status   Specimen Description   Final    BLOOD LEFT ANTECUBITAL Performed at [facility], [address] [place], [place] [address]    Special Requests   Final    Blood Culture adequate volume BOTTLES DRAWN AEROBIC AND ANAEROBIC Performed at [facility], [address], [place], [place] [address]    Culture   Final     NO GROWTH 2 DAYS Performed at [facility], [address] [place], [place] [address]    Report Status PENDING  Incomplete  Urine Culture     Status: Abnormal   Collection Time: 05/31/24  5:30 AM   Specimen: Urine, Random  Result Value Ref Range Status   Specimen Description   Final    URINE, RANDOM Performed at [facility], [address] [place], [place] [address]    Special Requests   Final    NONE Reflexed from [id number] Performed at [facility], [address], [place], [place] [address]    Culture >=100,000 COLONIES/mL ESCHERICHIA COLI (A)  Final   Report Status 06/02/2024 FINAL  Final   Organism ID, Bacteria ESCHERICHIA COLI (A)  Final      Susceptibility   Escherichia coli - MIC*    AMPICILLIN >=32 RESISTANT Resistant     CEFAZOLIN  (URINE) Value in next row Sensitive      4 SENSITIVEThis is a modified FDA-approved test that has been validated and its performance characteristics determined by the reporting laboratory.  This laboratory is certified under the Clinical Laboratory Improvement Amendments CLIA as qualified to perform high complexity clinical laboratory testing.    CEFEPIME Value in next row Sensitive      4 SENSITIVEThis is a modified FDA-approved test that has been validated and its performance characteristics determined by the reporting laboratory.  This laboratory is certified under the Clinical Laboratory Improvement Amendments CLIA as qualified to perform high complexity clinical laboratory testing.    ERTAPENEM Value in next row Sensitive      4 SENSITIVEThis is a modified FDA-approved test that has been validated and its performance characteristics determined by the reporting laboratory.  This laboratory is certified under the Clinical Laboratory Improvement Amendments CLIA as qualified to perform high complexity clinical laboratory testing.    CEFTRIAXONE  Value in next row Sensitive      4 SENSITIVEThis is a  modified FDA-approved test that has been validated  and its performance characteristics determined by the reporting laboratory.  This laboratory is certified under the Clinical Laboratory Improvement Amendments CLIA as qualified to perform high complexity clinical laboratory testing.    CIPROFLOXACIN Value in next row Sensitive      4 SENSITIVEThis is a modified FDA-approved test that has been validated and its performance characteristics determined by the reporting laboratory.  This laboratory is certified under the Clinical Laboratory Improvement Amendments CLIA as qualified to perform high complexity clinical laboratory testing.    GENTAMICIN Value in next row Sensitive      4 SENSITIVEThis is a modified FDA-approved test that has been validated and its performance characteristics determined by the reporting laboratory.  This laboratory is certified under the Clinical Laboratory Improvement Amendments CLIA as qualified to perform high complexity clinical laboratory testing.    NITROFURANTOIN Value in next row Sensitive      4 SENSITIVEThis is a modified FDA-approved test that has been validated and its performance characteristics determined by the reporting laboratory.  This laboratory is certified under the Clinical Laboratory Improvement Amendments CLIA as qualified to perform high complexity clinical laboratory testing.    TRIMETH/SULFA Value in next row Resistant      4 SENSITIVEThis is a modified FDA-approved test that has been validated and its performance characteristics determined by the reporting laboratory.  This laboratory is certified under the Clinical Laboratory Improvement Amendments CLIA as qualified to perform high complexity clinical laboratory testing.    AMPICILLIN/SULBACTAM Value in next row Intermediate      4 SENSITIVEThis is a modified FDA-approved test that has been validated and its performance characteristics determined by the reporting laboratory.  This laboratory is certified  under the Clinical Laboratory Improvement Amendments CLIA as qualified to perform high complexity clinical laboratory testing.    PIP/TAZO Value in next row Sensitive      <=4 SENSITIVEThis is a modified FDA-approved test that has been validated and its performance characteristics determined by the reporting laboratory.  This laboratory is certified under the Clinical Laboratory Improvement Amendments CLIA as qualified to perform high complexity clinical laboratory testing.    MEROPENEM Value in next row Sensitive      <=4 SENSITIVEThis is a modified FDA-approved test that has been validated and its performance characteristics determined by the reporting laboratory.  This laboratory is certified under the Clinical Laboratory Improvement Amendments CLIA as qualified to perform high complexity clinical laboratory testing.    * >=100,000 COLONIES/mL ESCHERICHIA COLI  Resp panel by RT-PCR (RSV, Flu A&B, Covid) Anterior Nasal Swab     Status: None   Collection Time: 05/31/24  6:04 AM   Specimen: Anterior Nasal Swab  Result Value Ref Range Status   SARS Coronavirus 2 by RT PCR NEGATIVE NEGATIVE Final    Comment: (NOTE) SARS-CoV-2 target nucleic acids are NOT DETECTED.  The SARS-CoV-2 RNA is generally detectable in upper respiratory specimens during the acute phase of infection. The lowest concentration of SARS-CoV-2 viral copies this assay can detect is 138 copies/mL. A negative result does not preclude SARS-Cov-2 infection and should not be used as the sole basis for treatment or other patient management decisions. A negative result may occur with  improper specimen collection/handling, submission of specimen other than nasopharyngeal swab, presence of viral mutation(s) within the areas targeted by this assay, and inadequate number of viral copies(<138 copies/mL). A negative result must be combined with clinical observations, patient history, and epidemiological information. The expected result  is Negative.  Fact Sheet for Patients:  BloggerCourse.com  Fact Sheet for Healthcare Providers:  SeriousBroker.it  This test is no t yet approved or cleared by the [place]  FDA and  has been authorized for detection and/or diagnosis of SARS-CoV-2 by FDA under an Emergency Use Authorization (EUA). This EUA will remain  in effect (meaning this test can be used) for the duration of the COVID-19 declaration under Section 564(b)(1) of the Act, 21 U.S.C.section 360bbb-3(b)(1), unless the authorization is terminated  or revoked sooner.       Influenza A by PCR NEGATIVE NEGATIVE Final   Influenza B by PCR NEGATIVE NEGATIVE Final    Comment: (NOTE) The Xpert Xpress SARS-CoV-2/FLU/RSV plus assay is intended as an aid in the diagnosis of influenza from Nasopharyngeal swab specimens and should not be used as a sole basis for treatment. Nasal washings and aspirates are unacceptable for Xpert Xpress SARS-CoV-2/FLU/RSV testing.  Fact Sheet for Patients: BloggerCourse.com  Fact Sheet for Healthcare Providers: SeriousBroker.it  This test is not yet approved or cleared by the [place]  FDA and has been authorized for detection and/or diagnosis of SARS-CoV-2 by FDA under an Emergency Use Authorization (EUA). This EUA will remain in effect (meaning this test can be used) for the duration of the COVID-19 declaration under Section 564(b)(1) of the Act, 21 U.S.C. section 360bbb-3(b)(1), unless the authorization is terminated or revoked.     Resp Syncytial Virus by PCR NEGATIVE NEGATIVE Final    Comment: (NOTE) Fact Sheet for Patients: BloggerCourse.com  Fact Sheet for Healthcare Providers: SeriousBroker.it  This test is not yet approved or cleared by the [place]  FDA and has been authorized for detection and/or diagnosis of  SARS-CoV-2 by FDA under an Emergency Use Authorization (EUA). This EUA will remain in effect (meaning this test can be used) for the duration of the COVID-19 declaration under Section 564(b)(1) of the Act, 21 U.S.C. section 360bbb-3(b)(1), unless the authorization is terminated or revoked.  Performed at [facility], [address] [place], [place] [address]     Radiology Studies: No results found.  Scheduled Meds:  amLODipine   5 mg Oral Daily   aspirin  EC  81 mg Oral Daily   atorvastatin   10 mg Oral q AM   carbidopa -levodopa   0.5 tablet Oral 5 times per day   Carbidopa -Levodopa  ER  1 tablet Oral Q8H   docusate sodium  100 mg Oral BID   DULoxetine   60 mg Oral q morning   enoxaparin  (LOVENOX ) injection  40 mg Subcutaneous Q24H   feeding supplement  237 mL Oral BID BM   magnesium oxide  400 mg Oral QHS   mirabegron ER  25 mg Oral Daily   multivitamin  1 tablet Oral BID   raloxifene   60 mg Oral q AM   Continuous Infusions:  cefTRIAXone  (ROCEPHIN )  IV 1 g (06/02/24 1019)     LOS: 2 days    Time spent: 40 mins    Elvan Sor, MD [facility]   If 7PM-7AM, please contact night-coverage

## 2024-06-02 NOTE — Progress Notes (Signed)
 Physical Therapy Treatment Patient Details Name: Morgan [name] MRN: [id number] DOB: 01-Aug-1951 Today's Date: 06/02/2024   History of Present Illness Morgan [name] is a 73 y.o. female admitted with acute encephalopathy secondary to UTI. head CT with no acute findings. PMH: anxiety, Parkinson's Disease, HLD, HTN, LBBB, hx of melanoma, MV and LBBB, neuropathy, CKD stage 2 and orthostatic hypotension    PT Comments  Pt supine in bed upon arrival, spouse present, both agreeable to therapy services. Pt needing mod A +2 to mobilize to bedside with bedpad assisting, constant multimodal cues for initiation and sequencing. Pt needing mod A+2 for safety to power to stand, cued for anterior weight shift while powering up and widening base of support prior to transfer and once standing. Pt with strong posterior lean in standing and sitting, needing intermittent cues to bring nose over toes with temporary improvement. Pt amb to restroom ~15 ft with HHA, mod A for steadying, cued for widening base of support and increased step length and clearance as pt taking short, slow shuffling steps. Pt able to void bladder but needing total assist for pericare and handwashing, increasing posterior lean with static stance. Pt then amb to recliner in room but spouse encouraging further amb, pt continues with slow, short, shufflng steps needing mod A and recliner follow, with distance pt with BLE buckling needing total A to prevent fall and assisted to recliner and returned to room. Pt remains eyes close unless constantly cued to open and attend to task. Updated d/c rec to continued inpatient follow up therapy, <3 hours/day as pt requiring increased assistance and significantly below baseline.    If plan is discharge home, recommend the following: A little help with bathing/dressing/bathroom;Assistance with cooking/housework;Assist for transportation;Help with stairs or ramp for entrance;Two people to help with walking  and/or transfers;A lot of help with bathing/dressing/bathroom   Can travel by private vehicle     No  Equipment Recommendations  None recommended by PT (defer to next venue)    Recommendations for Other Services       Precautions / Restrictions Precautions Precautions: Fall Recall of Precautions/Restrictions: Impaired Restrictions Weight Bearing Restrictions Per Provider Order: No     Mobility  Bed Mobility Overal bed mobility: Needs Assistance Bed Mobility: Supine to Sit     Supine to sit: Mod assist, +2 for physical assistance     General bed mobility comments: pt slowly inches BLE to bedside with constant multimodal cues, decreased initiation and rigidity, needing mod A+2 for trunk and BLE management with use of bedpad to come to sitting bedside, fair carryover with anterior trunk shift with sitting bedside but with time returns to posterior trunk lean    Transfers Overall transfer level: Needs assistance Equipment used: 1 person hand held assist Transfers: Sit to/from Stand Sit to Stand: Mod assist, +2 safety/equipment           General transfer comment: mod A to power up to stand, heavy initiation cues to shift weight forward and power up, strong posterior lean and narrow BOS requiring cues for shifting forward and widening once standing, +2 for safety    Ambulation/Gait Ambulation/Gait assistance: Mod assist, +2 physical assistance, +2 safety/equipment Gait Distance (Feet): 15 Feet (+25) Assistive device: 1 person hand held assist Gait Pattern/deviations: Step-to pattern, Decreased stride length, Shuffle, Narrow base of support Gait velocity: decreased     General Gait Details: slow, short steps with shuffling step progression, narrow BOS with eyes closed requiring constant multimodal cues for openeing  eyes and ambulation cues, pt amb to bathroom then out into hallway with recliner follow, spouse offering encouragement for bigger steps and foot clearance  wtihout improvement, pt ultimately begins BLE buckling requiring total A to prevent fall and return to recliner for seated rest   Stairs             Wheelchair Mobility     Tilt Bed    Modified Rankin (Stroke Patients Only)       Balance Overall balance assessment: Needs assistance Sitting-balance support: Feet unsupported, Bilateral upper extremity supported Sitting balance-Leahy Scale: Fair     Standing balance support: Reliant on assistive device for balance, During functional activity (external support) Standing balance-Leahy Scale: Poor                              Communication Communication Communication: No apparent difficulties  Cognition Arousal: Lethargic Behavior During Therapy: Flat affect   PT - Cognitive impairments: Difficult to assess Difficult to assess due to: Level of arousal                     PT - Cognition Comments: pt maintains eyes closed unless persistently cued to open eyes, decreased initiation, needing constant multimodal cues for sequencing and task performance, spouse present reporting pt is below baseline cognitively Following commands: Impaired Following commands impaired: Follows one step commands inconsistently    Cueing Cueing Techniques: Verbal cues, Tactile cues  Exercises      General Comments General comments (skin integrity, edema, etc.): Husband [name] present cueing pt and encouraging pt throughout session      Pertinent Vitals/Pain Pain Assessment Pain Assessment: No/denies pain    Home Living                          Prior Function            PT Goals (current goals can now be found in the care plan section) Acute Rehab PT Goals Patient Stated Goal: SNF then home PT Goal Formulation: With patient/family Time For Goal Achievement: 06/14/24 Potential to Achieve Goals: Good Progress towards PT goals: Progressing toward goals    Frequency    Min 2X/week      PT Plan       Co-evaluation              AM-PAC PT 6 Clicks Mobility   Outcome Measure  Help needed turning from your back to your side while in a flat bed without using bedrails?: A Lot Help needed moving from lying on your back to sitting on the side of a flat bed without using bedrails?: A Lot Help needed moving to and from a bed to a chair (including a wheelchair)?: Total Help needed standing up from a chair using your arms (e.g., wheelchair or bedside chair)?: A Lot Help needed to walk in hospital room?: Total Help needed climbing 3-5 steps with a railing? : Total 6 Click Score: 9    End of Session Equipment Utilized During Treatment: Gait belt Activity Tolerance: Patient limited by fatigue;Patient limited by lethargy Patient left: in chair;with call bell/phone within reach;with chair alarm set;with nursing/sitter in room;with family/visitor present Nurse Communication: Mobility status;Other (comment) (pill in bed found) PT Visit Diagnosis: Unsteadiness on feet (R26.81);Other abnormalities of gait and mobility (R26.89);Muscle weakness (generalized) (M62.81);Other symptoms and signs involving the nervous system (M70.101)     Time: 9054-8986  PT Time Calculation (min) (ACUTE ONLY): 28 min  Charges:    $Gait Training: 8-22 mins $Therapeutic Activity: 8-22 mins PT General Charges $$ ACUTE PT VISIT: 1 Visit                     Tori Abbigael Detlefsen PT, DPT 06/02/24, 10:46 AM

## 2024-06-02 NOTE — Plan of Care (Signed)

## 2024-06-02 NOTE — Plan of Care (Signed)
  Problem: Education: Goal: Knowledge of General Education information will improve Description: Including pain rating scale, medication(s)/side effects and non-pharmacologic comfort measures Outcome: Progressing   Problem: Clinical Measurements: Goal: Will remain free from infection Outcome: Progressing   Problem: Coping: Goal: Level of anxiety will decrease Outcome: Progressing   Problem: Safety: Goal: Ability to remain free from injury will improve Outcome: Progressing   Problem: Skin Integrity: Goal: Risk for impaired skin integrity will decrease Outcome: Progressing

## 2024-06-03 ENCOUNTER — Other Ambulatory Visit (HOSPITAL_COMMUNITY): Payer: Self-pay

## 2024-06-03 DIAGNOSIS — G9340 Encephalopathy, unspecified: Secondary | ICD-10-CM | POA: Diagnosis not present

## 2024-06-03 MED ORDER — CEFADROXIL 500 MG PO CAPS
500.0000 mg | ORAL_CAPSULE | Freq: Two times a day (BID) | ORAL | 0 refills | Status: AC
  Filled 2024-06-03: qty 10, 5d supply, fill #0

## 2024-06-03 NOTE — Discharge Instructions (Signed)
 Drink plenty of fluids  Close follow up with your PCP or Urology for recurrent UTI.

## 2024-06-03 NOTE — TOC Transition Note (Signed)
 Transition of Care [facility]) - Discharge Note   Patient Details  Name: Morgan [name] MRN: [id number] Date of Birth: [birth year]  Transition of Care [facility]) CM/SW Contact:  Alfonse JONELLE Rex, RN Phone Number: 06/03/2024, 11:16 AM   Clinical Narrative:   Teams chat received from DC planning nurse, patient's spouse at bedside ready to take patient home. PT recommendation for SNF, spouse has opted for [facility]. Ordered entered by attending for [facility] PT/OT/HHA. Hedda [facility], accepted referral, added to AVS. No further INPT CM needs at this time.     Final next level of care: Home w Home Health Services Barriers to Discharge: Barriers Resolved   Patient Goals and CMS Choice Patient states their goals for this hospitalization and ongoing recovery are:: return home          Discharge Placement                       Discharge Plan and Services Additional resources added to the After Visit Summary for                            [facility] Arranged: PT, OT, Nurse's Aide HH Agency: [facility]        Social Drivers of Health (SDOH) Interventions SDOH Screenings   Food Insecurity: No Food Insecurity (05/31/2024)  Housing: Low Risk  (05/31/2024)  Transportation Needs: No Transportation Needs (05/31/2024)  Utilities: Not At Risk (05/31/2024)  Financial Resource Strain: Low Risk  (09/24/2023)   Received from [facility]  Social Connections: Socially Integrated (05/31/2024)  Tobacco Use: Medium Risk (05/31/2024)     Readmission Risk Interventions    06/03/2024   11:15 AM  Readmission Risk Prevention Plan  Post Dischage Appt Complete  Medication Screening Complete  Transportation Screening Complete

## 2024-06-03 NOTE — Plan of Care (Signed)

## 2024-06-03 NOTE — Discharge Summary (Signed)
 Physician Discharge Summary   Patient: Morgan [name] MRN: [id number] DOB: [birth year]  Admit date:     05/31/2024  Discharge date: 06/03/24  Discharge Physician: Owen DELENA Lore   PCP: [name], MD   Recommendations at discharge:    Needs to follow up with PCP for evaluation of chronic suppression for recurrent UTI.   Discharge Diagnoses: Principal Problem:   Acute encephalopathy secondary to UTI Active Problems:   Hypokalemia   HTN (hypertension)   Parkinson disease (HCC)   Hereditary and idiopathic peripheral neuropathy   CKD (chronic kidney disease), stage II   Hyperlipidemia   Anxiety disorder   Macular degeneration   UTI (urinary tract infection)  Resolved Problems:   * No resolved hospital problems. *  Hospital Course:  73 yrs old female with PMH significant for anxiety, hypertension, history of melanoma, hyperlipidemia, PD, history of LBBB, neuropathy, CKD stage II, orthostatic hypotension presented in the ED with fatigue and confusion.  Patient was found to be altered, husband reports history of recurrent UTI and been following up with urology.  They just took her off from prophylactic Keflex  about a month ago.  She has seen urology 2 weeks ago and had a urinary order but there was no urinary tract infection.  In the ED UA suspicious for infection.  CT head no acute findings.  Patient was admitted for acute encephalopathy likely due to UTI , started on empiric antibiotics.    Assessment and Plan: Acute Encephalopathy secondary to UTI: She presented with confusion and lethargy found to have UTI. Lactic acid normal, No SIRS criteria met. S/p IVF, Procalcitonin 0.10 Treated with ceftriaxone  1 g IV daily  CT head. No acute abnormality, metabolic panel within normal limits. She has hx. of recurrent UTIs followed by urology, prophylactic keflex  stopped about one month ago  Urine culture growing E. Coli, resistant to Bactrim and Unasyn, sensitive to rest with  antibiotics. Confusion improved.  She will be discharge on Cefadroxil 500 mg BID for 5 days, to complete 7 days treatment. Will defer to PCP suppressive antibiotics tx.    Hypokalemia: Resolved Replaced.  Continue to monitor    HTN (hypertension): Hx of orthostatic hypotension and all anti-HTN meds stopped back in spring. Now on amlodipine  5mg , Continue amlodipine  5 mg  Has not needed midodrine  and no hx of falls/syncope.   Parkinson disease (HCC) Delirium and fall precautions. Continue sinemet  . PT/OT eval    Hereditary and idiopathic peripheral neuropathy: Continue cymbalta , No longer on gabapentin     CKD (chronic kidney disease), stage II Stable, serum creatinine at baseline.   Hyperlipidemia: Continue lipitor 10mg  daily.   Anxiety disorder: Continue cymbalta  .   Macular degeneration: Receives injections and bloody sclera of right eye due to recent injection.        Consultants: None Procedures performed:none Disposition: Home Diet recommendation:  Discharge Diet Orders (From admission, onward)     Start     Ordered   06/03/24 0000  Diet - low sodium heart healthy        06/03/24 1022           Regular diet DISCHARGE MEDICATION: Allergies as of 06/03/2024       Reactions   Anti-seizure Depletion [a-g Pro] Anaphylaxis   Phenergan [promethazine Hcl] Other (See Comments)   Makes Parkinson's worse   Acetaminophen  Other (See Comments)   I just get weird   Promethazine Other (See Comments)   Husband unaware of reaction    Itraconazole Hives  Other Reaction(s): Unknown Spornox    Tamsulosin  Other (See Comments)   B/P DROPPED TOO MUCH!!        Medication List     STOP taking these medications    midodrine  2.5 MG tablet Commonly known as: PROAMATINE        TAKE these medications    acidophilus Caps capsule Take 1 capsule by mouth daily.   ALPRAZolam 0.25 MG tablet Commonly known as: XANAX Take 0.25 mg by mouth daily as needed for  anxiety.   amLODipine  5 MG tablet Commonly known as: NORVASC  Take 5 mg by mouth daily.   aspirin  EC 81 MG tablet Take 81 mg by mouth daily. Swallow whole.   atorvastatin  10 MG tablet Commonly known as: LIPITOR Take 10 mg by mouth in the morning.   Blink Tears 0.25 % Soln Generic drug: Polyethylene Glycol 400 Place 1 drop into both eyes 3 (three) times daily as needed (for dryness).   carbidopa -levodopa  25-100 MG tablet Commonly known as: SINEMET  IR 1 tablet every 2 hours starting at 6-7am (getting about 6-7 in per day)The medication have a score line so it can be broken.   Carbidopa -Levodopa  ER 25-100 MG tablet controlled release Commonly known as: SINEMET  CR Take 1 tablet by mouth 3 (three) times daily.   cefadroxil 500 MG capsule Commonly known as: DURICEF Take 1 capsule (500 mg total) by mouth 2 (two) times daily for 5 days.   Cephalexin  250 MG tablet Take 250 mg by mouth as needed (for uti).   Cymbalta  60 MG capsule Generic drug: DULoxetine  TAKE 1 CAPSULE EVERY       MORNING   estradiol 0.1 MG/GM vaginal cream Commonly known as: ESTRACE Place 1 Applicatorful vaginally 3 (three) times a week.   Gemtesa 75 MG Tabs Generic drug: Vibegron Take 75 mg by mouth in the morning.   Ibuprofen  200 MG Caps Take 2 capsules (400 mg total) by mouth every 8 (eight) hours as needed (Leg pain, headache or mild pain).   Krill Oil 500 MG Caps Take 500 mg by mouth at bedtime.   magnesium oxide 400 MG tablet Commonly known as: MAG-OX Take 400 mg by mouth at bedtime.   methocarbamol 750 MG tablet Commonly known as: ROBAXIN Take 750 mg by mouth every 6 (six) hours as needed for muscle spasms (or leg pain).   multivitamin with minerals Tabs tablet Take 1 tablet by mouth daily.   PRESERVISION AREDS 2 PO Take 1 capsule by mouth 2 (two) times daily.   QUNOL ULTRA COQ10 PO Take 1 capsule by mouth at bedtime.   raloxifene  60 MG tablet Commonly known as: EVISTA  Take 60 mg by  mouth in the morning.   TART CHERRY PO Take 3,000 mg by mouth at bedtime.   Vitamin D3 25 MCG (1000 UT) Chew Chew 2,500 Units by mouth daily.        Follow-up Information     [name], MD Follow up in 1 week(s).   Specialty: Family Medicine Contact information: [address] [place] [address] [phone]                Discharge Exam: Fredricka Weights   05/31/24 1654  Weight: 74 kg   General; NAD  Condition at discharge: stable  The results of significant diagnostics from this hospitalization (including imaging, microbiology, ancillary and laboratory) are listed below for reference.   Imaging Studies: CT HEAD WO CONTRAST ( ) Result Date: 05/31/2024 CLINICAL DATA:  Mental status  changes. History of Parkinson's disease. EXAM: CT HEAD WITHOUT CONTRAST TECHNIQUE: Contiguous axial images were obtained from the base of the skull through the vertex without intravenous contrast. RADIATION DOSE REDUCTION: This exam was performed according to the departmental dose-optimization program which includes automated exposure control, adjustment of the mA and/or kV according to patient size and/or use of iterative reconstruction technique. COMPARISON:  01/23/2024 FINDINGS: Brain: There is no evidence for acute hemorrhage, hydrocephalus, mass lesion, or abnormal extra-axial fluid collection. No definite CT evidence for acute infarction. Diffuse loss of parenchymal volume is consistent with atrophy. Patchy low attenuation in the deep hemispheric and periventricular white matter is nonspecific, but likely reflects chronic microvascular ischemic demyelination. Vascular: No hyperdense vessel or unexpected calcification. Skull: Normal. Negative for fracture or focal lesion. Sinuses/Orbits: The visualized paranasal sinuses and mastoid air cells are clear. Visualized portions of the globes and intraorbital fat are unremarkable. Other: None. IMPRESSION: 1. No acute  intracranial abnormality. 2. Atrophy with chronic small vessel ischemic disease. Electronically Signed   By: Camellia Candle M.D.   On: 05/31/2024 05:21   DG Chest Port 1 View Result Date: 05/31/2024 CLINICAL DATA:  Questionable sepsis. EXAM: PORTABLE CHEST 1 VIEW COMPARISON:  11/12/2023 FINDINGS: Low lung volumes. Cardiopericardial silhouette is at upper limits of normal for size. Vascular congestion with subtle background interstitial opacity raising the question of edema. No dense focal airspace consolidation. No pneumothorax or substantial pleural effusion. Telemetry leads overlie the chest. IMPRESSION: Low volume film with vascular congestion and subtle background interstitial opacity raising the question of edema. Electronically Signed   By: Camellia Candle M.D.   On: 05/31/2024 05:01    Microbiology: Results for orders placed or performed during the hospital encounter of 05/31/24  Blood Culture (routine x 2)     Status: None (Preliminary result)   Collection Time: 05/31/24  4:32 AM   Specimen: BLOOD  Result Value Ref Range Status   Specimen Description   Final    BLOOD RIGHT ANTECUBITAL Performed at [facility], [address] [place], [place] [address]    Special Requests   Final    Blood Culture adequate volume BOTTLES DRAWN AEROBIC AND ANAEROBIC Performed at [facility], [address], [place], [place] [address]    Culture   Final    NO GROWTH 3 DAYS Performed at [facility], [address], [place], [place] [address]    Report Status PENDING  Incomplete  Blood Culture (routine x 2)     Status: None (Preliminary result)   Collection Time: 05/31/24  4:51 AM   Specimen: BLOOD  Result Value Ref Range Status   Specimen Description   Final    BLOOD LEFT ANTECUBITAL Performed at [facility], [address] [place], [place] [address]    Special Requests   Final    Blood Culture adequate volume BOTTLES DRAWN AEROBIC  AND ANAEROBIC Performed at [facility], [address] [place], [place] [address]    Culture   Final    NO GROWTH 3 DAYS Performed at [facility], [address], [place], [place] [address]    Report Status PENDING  Incomplete  Urine Culture     Status: Abnormal   Collection Time: 05/31/24  5:30 AM   Specimen: Urine, Random  Result Value Ref Range Status   Specimen Description   Final    URINE, RANDOM Performed at [facility], [address]  [place], [place] [address]    Special Requests   Final    NONE Reflexed from [id number] Performed at [facility], [address] [place], [place] [address]    Culture >=100,000 COLONIES/mL ESCHERICHIA COLI (A)  Final   Report Status 06/02/2024 FINAL  Final   Organism ID, Bacteria ESCHERICHIA COLI (A)  Final      Susceptibility   Escherichia coli - MIC*    AMPICILLIN >=32 RESISTANT Resistant     CEFAZOLIN  (URINE) Value in next row Sensitive      4 SENSITIVEThis is a modified FDA-approved test that has been validated and its performance characteristics determined by the reporting laboratory.  This laboratory is certified under the Clinical Laboratory Improvement Amendments CLIA as qualified to perform high complexity clinical laboratory testing.    CEFEPIME Value in next row Sensitive      4 SENSITIVEThis is a modified FDA-approved test that has been validated and its performance characteristics determined by the reporting laboratory.  This laboratory is certified under the Clinical Laboratory Improvement Amendments CLIA as qualified to perform high complexity clinical laboratory testing.    ERTAPENEM Value in next row Sensitive      4 SENSITIVEThis is a modified FDA-approved test that has been validated and its performance characteristics determined by the reporting laboratory.  This laboratory is certified under the Clinical Laboratory Improvement Amendments CLIA as qualified to  perform high complexity clinical laboratory testing.    CEFTRIAXONE  Value in next row Sensitive      4 SENSITIVEThis is a modified FDA-approved test that has been validated and its performance characteristics determined by the reporting laboratory.  This laboratory is certified under the Clinical Laboratory Improvement Amendments CLIA as qualified to perform high complexity clinical laboratory testing.    CIPROFLOXACIN Value in next row Sensitive      4 SENSITIVEThis is a modified FDA-approved test that has been validated and its performance characteristics determined by the reporting laboratory.  This laboratory is certified under the Clinical Laboratory Improvement Amendments CLIA as qualified to perform high complexity clinical laboratory testing.    GENTAMICIN Value in next row Sensitive      4 SENSITIVEThis is a modified FDA-approved test that has been validated and its performance characteristics determined by the reporting laboratory.  This laboratory is certified under the Clinical Laboratory Improvement Amendments CLIA as qualified to perform high complexity clinical laboratory testing.    NITROFURANTOIN Value in next row Sensitive      4 SENSITIVEThis is a modified FDA-approved test that has been validated and its performance characteristics determined by the reporting laboratory.  This laboratory is certified under the Clinical Laboratory Improvement Amendments CLIA as qualified to perform high complexity clinical laboratory testing.    TRIMETH/SULFA Value in next row Resistant      4 SENSITIVEThis is a modified FDA-approved test that has been validated and its performance characteristics determined by the reporting laboratory.  This laboratory is certified under the Clinical Laboratory Improvement Amendments CLIA as qualified to perform high complexity clinical laboratory testing.    AMPICILLIN/SULBACTAM Value in next row Intermediate      4 SENSITIVEThis is a modified FDA-approved test that  has been validated and its performance characteristics determined by the reporting laboratory.  This laboratory is certified under the Clinical Laboratory Improvement Amendments CLIA as qualified to perform high complexity clinical laboratory testing.    PIP/TAZO Value in next row Sensitive      <=4 SENSITIVEThis is a modified FDA-approved test that  has been validated and its performance characteristics determined by the reporting laboratory.  This laboratory is certified under the Clinical Laboratory Improvement Amendments CLIA as qualified to perform high complexity clinical laboratory testing.    MEROPENEM Value in next row Sensitive      <=4 SENSITIVEThis is a modified FDA-approved test that has been validated and its performance characteristics determined by the reporting laboratory.  This laboratory is certified under the Clinical Laboratory Improvement Amendments CLIA as qualified to perform high complexity clinical laboratory testing.    * >=100,000 COLONIES/mL ESCHERICHIA COLI  Resp panel by RT-PCR (RSV, Flu A&B, Covid) Anterior Nasal Swab     Status: None   Collection Time: 05/31/24  6:04 AM   Specimen: Anterior Nasal Swab  Result Value Ref Range Status   SARS Coronavirus 2 by RT PCR NEGATIVE NEGATIVE Final    Comment: (NOTE) SARS-CoV-2 target nucleic acids are NOT DETECTED.  The SARS-CoV-2 RNA is generally detectable in upper respiratory specimens during the acute phase of infection. The lowest concentration of SARS-CoV-2 viral copies this assay can detect is 138 copies/mL. A negative result does not preclude SARS-Cov-2 infection and should not be used as the sole basis for treatment or other patient management decisions. A negative result may occur with  improper specimen collection/handling, submission of specimen other than nasopharyngeal swab, presence of viral mutation(s) within the areas targeted by this assay, and inadequate number of viral copies(<138 copies/mL). A negative  result must be combined with clinical observations, patient history, and epidemiological information. The expected result is Negative.  Fact Sheet for Patients:  BloggerCourse.com  Fact Sheet for Healthcare Providers:  SeriousBroker.it  This test is no t yet approved or cleared by the [place]  FDA and  has been authorized for detection and/or diagnosis of SARS-CoV-2 by FDA under an Emergency Use Authorization (EUA). This EUA will remain  in effect (meaning this test can be used) for the duration of the COVID-19 declaration under Section 564(b)(1) of the Act, 21 U.S.C.section 360bbb-3(b)(1), unless the authorization is terminated  or revoked sooner.       Influenza A by PCR NEGATIVE NEGATIVE Final   Influenza B by PCR NEGATIVE NEGATIVE Final    Comment: (NOTE) The Xpert Xpress SARS-CoV-2/FLU/RSV plus assay is intended as an aid in the diagnosis of influenza from Nasopharyngeal swab specimens and should not be used as a sole basis for treatment. Nasal washings and aspirates are unacceptable for Xpert Xpress SARS-CoV-2/FLU/RSV testing.  Fact Sheet for Patients: BloggerCourse.com  Fact Sheet for Healthcare Providers: SeriousBroker.it  This test is not yet approved or cleared by the [place]  FDA and has been authorized for detection and/or diagnosis of SARS-CoV-2 by FDA under an Emergency Use Authorization (EUA). This EUA will remain in effect (meaning this test can be used) for the duration of the COVID-19 declaration under Section 564(b)(1) of the Act, 21 U.S.C. section 360bbb-3(b)(1), unless the authorization is terminated or revoked.     Resp Syncytial Virus by PCR NEGATIVE NEGATIVE Final    Comment: (NOTE) Fact Sheet for Patients: BloggerCourse.com  Fact Sheet for Healthcare Providers: SeriousBroker.it  This  test is not yet approved or cleared by the [place]  FDA and has been authorized for detection and/or diagnosis of SARS-CoV-2 by FDA under an Emergency Use Authorization (EUA). This EUA will remain in effect (meaning this test can be used) for the duration of the COVID-19 declaration under Section 564(b)(1) of the Act, 21 U.S.C. section 360bbb-3(b)(1), unless the authorization is terminated  or revoked.  Performed at [facility], [address], [place], [place] [address]     Labs: CBC: Recent Labs  Lab 05/31/24 [phone] 06/01/24 0613 06/02/24 0519  WBC 7.7 7.9 7.4  NEUTROABS 5.7  --   --   HGB 12.9 12.9 12.6  HCT 42.5 40.2 40.8  MCV 91.6 90.5 90.5  PLT 245 242 243   Basic Metabolic Panel: Recent Labs  Lab 05/31/24 0432 05/31/24 1037 06/01/24 0613 06/02/24 0519  NA 141  --  138 141  K 3.3*  --  3.2* 3.7  CL 102  --  101 103  CO2 27  --  25 26  GLUCOSE 99  --  100* 109*  BUN 18  --  12 20  CREATININE 0.91  --  0.67 0.93  CALCIUM  9.1  --  9.4 9.7  MG  --  2.2  --  2.4  PHOS  --   --   --  3.8   Liver Function Tests: Recent Labs  Lab 05/31/24 0432  AST 23  ALT <5  ALKPHOS 107  BILITOT 0.4  PROT 7.3  ALBUMIN 4.3   CBG: Recent Labs  Lab 05/31/24 2100  GLUCAP 109*    Discharge time spent: greater than 30 minutes.  Signed: Owen DELENA Lore, MD [facility] 06/03/2024

## 2024-06-03 NOTE — Progress Notes (Signed)
 Discharge med in a secure bag delivered to pt in room by this RN

## 2024-06-04 NOTE — Progress Notes (Deleted)
 Morgan [name] [facility] [address] [place] [address] Phone: [phone] Subjective:    I'm seeing this patient by the request  of:  [name] Pellet, MD  CC:   YEP:Dlagzrupcz  03/05/2024 Patient is still working hard on trying to increase her strength.  Working with a Systems analyst.  Seems to be somewhat fatigued at the moment.  We discussed icing regimen and home exercises, increase activity slowly.  Follow-up with me again in 12 weeks.  Did discuss potential treatment with creatine.  I do think that they have changed to the long-acting medications could be helpful as well.  Total time with patient as well as reviewing outside records 31 minutes     Update 06/05/2024 Morgan [name] is a 73 y.o. female coming in with complaint of parkinson's disease. Patient states       Past Medical History:  Diagnosis Date   Anxiety    Fibromyalgia    Headache    Hypercholesteremia    controlled on medication   Hyperlipidemia    Hypertension    no current meds   LBBB (left bundle branch block)    Melanoma (HCC)    MVP (mitral valve prolapse)    Osteopenia    Parkinson disease (HCC)    Pneumonia    SVD (spontaneous vaginal delivery)    x 2   Past Surgical History:  Procedure Laterality Date   BLEPHAROPLASTY     cold knife conization     COLONOSCOPY     FH colon CA   CYSTOSCOPY W/ URETERAL STENT PLACEMENT Left 10/18/2021   Procedure: CYSTOSCOPY WITH RETROGRADE PYELOGRAM, URETERAL STENT PLACEMENT;  Surgeon: Nieves Cough, MD;  Location: [facility];  Service: Urology;  Laterality: Left;   DILATATION & CURETTAGE/HYSTEROSCOPY WITH MYOSURE N/A 02/14/2018   Procedure: ATTEMPTED DILATATION & CURETTAGE/HYSTEROSCOPY WITH MYOSURE;  Surgeon: Rosalva Sawyer, MD;  Location: [facility];  Service: Gynecology;  Laterality: N/A;  polyp   melanoma cancer     removed from left leg   RECONSTRUCTION OF EYELID     TONSILLECTOMY     TUBAL LIGATION     URETEROSCOPY WITH HOLMIUM  LASER LITHOTRIPSY Left 12/17/2021   Procedure: URETEROSCOPY/HOLMIUM LASER/STENT EXCHANGE;  Surgeon: Nieves Cough, MD;  Location: [facility];  Service: Urology;  Laterality: Left;   WISDOM TOOTH EXTRACTION     Social History   Socioeconomic History   Marital status: Married    Spouse name: Not on file   Number of children: 2   Years of education: Not on file   Highest education level: Bachelor's degree (e.g., BA, AB, BS)  Occupational History   Occupation: retired    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  Tobacco Use   Smoking status: Former    Current packs/day: 0.00    Types: Cigarettes    Quit date: 09/29/1997    Years since quitting: 26.6   Smokeless tobacco: Never   Tobacco comments:    chews Nicorette, 10-12/day  Vaping Use   Vaping status: Never Used  Substance and Sexual Activity   Alcohol  use: Yes    Comment: Occasional wine   Drug use: No   Sexual activity: Not on file  Other Topics Concern   Not on file  Social History Narrative   right handed   2 story home    Lives with spouse   Social Drivers of Health   Financial Resource Strain: Low Risk  (09/24/2023)   Received from [facility]  Social Needs Screening - Medical Financial Resource Strain    Would you like help with any of the following needs? (Select ALL that apply): I don't want help with any of these  Food Insecurity: No Food Insecurity (05/31/2024)   Hunger Vital Sign    Worried About Running Out of Food in the Last Year: Never true    Ran Out of Food in the Last Year: Never true  Transportation Needs: No Transportation Needs (05/31/2024)   PRAPARE - Administrator, Civil Service (Medical): No    Lack of Transportation (Non-Medical): No  Physical Activity: Not on file  Stress: Not on file  Social Connections: Socially Integrated (05/31/2024)   Social Connection and Isolation Panel    Frequency of Communication with Friends and Family: Three times a week    Frequency of Social  Gatherings with Friends and Family: Three times a week    Attends Religious Services: More than 4 times per year    Active Member of Clubs or Organizations: Not on file    Attends Banker Meetings: More than 4 times per year    Marital Status: Married   Allergies  Allergen Reactions   Anti-Seizure Depletion [A-G Pro] Anaphylaxis   Phenergan [Promethazine Hcl] Other (See Comments)    Makes Parkinson's worse   Acetaminophen  Other (See Comments)    I just get weird   Promethazine Other (See Comments)    Husband unaware of reaction    Itraconazole Hives    Other Reaction(s): Unknown Spornox    Tamsulosin  Other (See Comments)    B/P DROPPED TOO MUCH!!   Family History  Problem Relation Age of Onset   Cancer Mother        colon   Healthy Brother    Healthy Sister    Healthy Child     Current Outpatient Medications (Endocrine & Metabolic):    raloxifene  (EVISTA ) 60 MG tablet, Take 60 mg by mouth in the morning.  Current Outpatient Medications (Cardiovascular):    amLODipine  (NORVASC ) 5 MG tablet, Take 5 mg by mouth daily.   atorvastatin  (LIPITOR) 10 MG tablet, Take 10 mg by mouth in the morning.   Current Outpatient Medications (Analgesics):    aspirin  EC 81 MG tablet, Take 81 mg by mouth daily. Swallow whole.   Ibuprofen  200 MG CAPS, Take 2 capsules (400 mg total) by mouth every 8 (eight) hours as needed (Leg pain, headache or mild pain).   Current Outpatient Medications (Other):    acidophilus (RISAQUAD) CAPS capsule, Take 1 capsule by mouth daily.   ALPRAZolam (XANAX) 0.25 MG tablet, Take 0.25 mg by mouth daily as needed for anxiety.   BLINK TEARS 0.25 % SOLN, Place 1 drop into both eyes 3 (three) times daily as needed (for dryness).   carbidopa -levodopa  (SINEMET  IR) 25-100 MG tablet, 1 tablet every 2 hours starting at 6-7am (getting about 6-7 in per day)The medication have a score line so it can be broken.   Carbidopa -Levodopa  ER (SINEMET  CR) 25-100 MG  tablet controlled release, Take 1 tablet by mouth 3 (three) times daily.   cefadroxil (DURICEF) 500 MG capsule, Take 1 capsule (500 mg total) by mouth 2 (two) times daily for 5 days.   Cephalexin  250 MG tablet, Take 250 mg by mouth as needed (for uti).   Cholecalciferol (VITAMIN D3) 25 MCG (1000 UT) CHEW, Chew 2,500 Units by mouth daily.   Coenzyme Q10-Vitamin E (QUNOL ULTRA COQ10 PO), Take 1 capsule by mouth at  bedtime.   CYMBALTA  60 MG capsule, TAKE 1 CAPSULE EVERY       MORNING   estradiol (ESTRACE) 0.1 MG/GM vaginal cream, Place 1 Applicatorful vaginally 3 (three) times a week. (Patient not taking: Reported on 05/31/2024)   Krill Oil 500 MG CAPS, Take 500 mg by mouth at bedtime.   magnesium oxide (MAG-OX) 400 MG tablet, Take 400 mg by mouth at bedtime.   methocarbamol (ROBAXIN) 750 MG tablet, Take 750 mg by mouth every 6 (six) hours as needed for muscle spasms (or leg pain).   Multiple Vitamin (MULTIVITAMIN WITH MINERALS) TABS tablet, Take 1 tablet by mouth daily.   Multiple Vitamins-Minerals (PRESERVISION AREDS 2 PO), Take 1 capsule by mouth 2 (two) times daily.   TART CHERRY PO, Take 3,000 mg by mouth at bedtime.   Vibegron (GEMTESA) 75 MG TABS, Take 75 mg by mouth in the morning.   Reviewed prior external information including notes and imaging from  primary care provider As well as notes that were available from care everywhere and other healthcare systems.  Past medical history, social, surgical and family history all reviewed in electronic medical record.  No pertanent information unless stated regarding to the chief complaint.   Review of Systems:  No headache, visual changes, nausea, vomiting, diarrhea, constipation, dizziness, abdominal pain, skin rash, fevers, chills, night sweats, weight loss, swollen lymph nodes, body aches, joint swelling, chest pain, shortness of breath, mood changes. POSITIVE muscle aches  Objective  There were no vitals taken for this visit.   General:  No apparent distress alert and oriented x3 mood and affect normal, dressed appropriately.  HEENT: Pupils equal, extraocular movements intact  Respiratory: Patient's speak in full sentences and does not appear short of breath  Cardiovascular: No lower extremity edema, non tender, no erythema      Impression and Recommendations:

## 2024-06-05 ENCOUNTER — Ambulatory Visit: Admitting: Family Medicine

## 2024-06-05 ENCOUNTER — Telehealth: Payer: Self-pay | Admitting: Family Medicine

## 2024-06-05 LAB — CULTURE, BLOOD (ROUTINE X 2)
Culture: NO GROWTH
Culture: NO GROWTH
Special Requests: ADEQUATE
Special Requests: ADEQUATE

## 2024-06-05 NOTE — Telephone Encounter (Signed)
 Patients husband called and asked that Dr. Claudene give him a call in regards to what was discussed today. It is in regards to medication. He states that it has to do with her medication maintenance. Please advise.

## 2024-06-06 DIAGNOSIS — G20B2 Parkinson's disease with dyskinesia, with fluctuations: Secondary | ICD-10-CM | POA: Diagnosis not present

## 2024-06-06 DIAGNOSIS — Z09 Encounter for follow-up examination after completed treatment for conditions other than malignant neoplasm: Secondary | ICD-10-CM | POA: Diagnosis not present

## 2024-06-06 DIAGNOSIS — N390 Urinary tract infection, site not specified: Secondary | ICD-10-CM | POA: Diagnosis not present

## 2024-06-06 DIAGNOSIS — I10 Essential (primary) hypertension: Secondary | ICD-10-CM | POA: Diagnosis not present

## 2024-06-07 DIAGNOSIS — Z8701 Personal history of pneumonia (recurrent): Secondary | ICD-10-CM | POA: Diagnosis not present

## 2024-06-07 DIAGNOSIS — H3530 Unspecified macular degeneration: Secondary | ICD-10-CM | POA: Diagnosis not present

## 2024-06-07 DIAGNOSIS — G609 Hereditary and idiopathic neuropathy, unspecified: Secondary | ICD-10-CM | POA: Diagnosis not present

## 2024-06-07 DIAGNOSIS — G319 Degenerative disease of nervous system, unspecified: Secondary | ICD-10-CM | POA: Diagnosis not present

## 2024-06-07 DIAGNOSIS — B9620 Unspecified Escherichia coli [E. coli] as the cause of diseases classified elsewhere: Secondary | ICD-10-CM | POA: Diagnosis not present

## 2024-06-07 DIAGNOSIS — I129 Hypertensive chronic kidney disease with stage 1 through stage 4 chronic kidney disease, or unspecified chronic kidney disease: Secondary | ICD-10-CM | POA: Diagnosis not present

## 2024-06-07 DIAGNOSIS — Z85820 Personal history of malignant melanoma of skin: Secondary | ICD-10-CM | POA: Diagnosis not present

## 2024-06-07 DIAGNOSIS — M8580 Other specified disorders of bone density and structure, unspecified site: Secondary | ICD-10-CM | POA: Diagnosis not present

## 2024-06-07 DIAGNOSIS — G20A1 Parkinson's disease without dyskinesia, without mention of fluctuations: Secondary | ICD-10-CM | POA: Diagnosis not present

## 2024-06-07 DIAGNOSIS — Z7982 Long term (current) use of aspirin: Secondary | ICD-10-CM | POA: Diagnosis not present

## 2024-06-07 DIAGNOSIS — Z9181 History of falling: Secondary | ICD-10-CM | POA: Diagnosis not present

## 2024-06-07 DIAGNOSIS — E7800 Pure hypercholesterolemia, unspecified: Secondary | ICD-10-CM | POA: Diagnosis not present

## 2024-06-07 DIAGNOSIS — Z87891 Personal history of nicotine dependence: Secondary | ICD-10-CM | POA: Diagnosis not present

## 2024-06-07 DIAGNOSIS — I447 Left bundle-branch block, unspecified: Secondary | ICD-10-CM | POA: Diagnosis not present

## 2024-06-07 DIAGNOSIS — N390 Urinary tract infection, site not specified: Secondary | ICD-10-CM | POA: Diagnosis not present

## 2024-06-07 DIAGNOSIS — M797 Fibromyalgia: Secondary | ICD-10-CM | POA: Diagnosis not present

## 2024-06-07 DIAGNOSIS — F419 Anxiety disorder, unspecified: Secondary | ICD-10-CM | POA: Diagnosis not present

## 2024-06-07 DIAGNOSIS — I341 Nonrheumatic mitral (valve) prolapse: Secondary | ICD-10-CM | POA: Diagnosis not present

## 2024-06-07 DIAGNOSIS — N182 Chronic kidney disease, stage 2 (mild): Secondary | ICD-10-CM | POA: Diagnosis not present

## 2024-06-07 DIAGNOSIS — I951 Orthostatic hypotension: Secondary | ICD-10-CM | POA: Diagnosis not present

## 2024-06-07 DIAGNOSIS — G9340 Encephalopathy, unspecified: Secondary | ICD-10-CM | POA: Diagnosis not present

## 2024-06-12 DIAGNOSIS — N182 Chronic kidney disease, stage 2 (mild): Secondary | ICD-10-CM | POA: Diagnosis not present

## 2024-06-12 DIAGNOSIS — G20A1 Parkinson's disease without dyskinesia, without mention of fluctuations: Secondary | ICD-10-CM | POA: Diagnosis not present

## 2024-06-12 DIAGNOSIS — I129 Hypertensive chronic kidney disease with stage 1 through stage 4 chronic kidney disease, or unspecified chronic kidney disease: Secondary | ICD-10-CM | POA: Diagnosis not present

## 2024-06-12 DIAGNOSIS — B9620 Unspecified Escherichia coli [E. coli] as the cause of diseases classified elsewhere: Secondary | ICD-10-CM | POA: Diagnosis not present

## 2024-06-12 DIAGNOSIS — N390 Urinary tract infection, site not specified: Secondary | ICD-10-CM | POA: Diagnosis not present

## 2024-06-12 DIAGNOSIS — G9340 Encephalopathy, unspecified: Secondary | ICD-10-CM | POA: Diagnosis not present

## 2024-06-13 ENCOUNTER — Other Ambulatory Visit (HOSPITAL_BASED_OUTPATIENT_CLINIC_OR_DEPARTMENT_OTHER): Payer: Self-pay

## 2024-06-14 ENCOUNTER — Other Ambulatory Visit (HOSPITAL_BASED_OUTPATIENT_CLINIC_OR_DEPARTMENT_OTHER): Payer: Self-pay

## 2024-06-14 MED ORDER — MIRABEGRON ER 25 MG PO TB24
25.0000 mg | ORAL_TABLET | Freq: Every day | ORAL | 3 refills | Status: AC
  Filled 2024-08-07: qty 90, 90d supply, fill #0

## 2024-06-14 MED ORDER — RALOXIFENE HCL 60 MG PO TABS
60.0000 mg | ORAL_TABLET | Freq: Every day | ORAL | 2 refills | Status: AC
  Filled 2024-06-14 – 2024-08-07 (×4): qty 90, 90d supply, fill #0

## 2024-06-14 MED ORDER — CARBIDOPA-LEVODOPA ER 25-100 MG PO TBCR
1.0000 | EXTENDED_RELEASE_TABLET | Freq: Two times a day (BID) | ORAL | 1 refills | Status: AC
  Filled 2024-08-06 – 2024-08-26 (×4): qty 180, 90d supply, fill #0

## 2024-06-18 DIAGNOSIS — G9340 Encephalopathy, unspecified: Secondary | ICD-10-CM | POA: Diagnosis not present

## 2024-06-18 DIAGNOSIS — N390 Urinary tract infection, site not specified: Secondary | ICD-10-CM | POA: Diagnosis not present

## 2024-06-18 DIAGNOSIS — N182 Chronic kidney disease, stage 2 (mild): Secondary | ICD-10-CM | POA: Diagnosis not present

## 2024-06-18 DIAGNOSIS — G20A1 Parkinson's disease without dyskinesia, without mention of fluctuations: Secondary | ICD-10-CM | POA: Diagnosis not present

## 2024-06-18 DIAGNOSIS — B9620 Unspecified Escherichia coli [E. coli] as the cause of diseases classified elsewhere: Secondary | ICD-10-CM | POA: Diagnosis not present

## 2024-06-18 DIAGNOSIS — I129 Hypertensive chronic kidney disease with stage 1 through stage 4 chronic kidney disease, or unspecified chronic kidney disease: Secondary | ICD-10-CM | POA: Diagnosis not present

## 2024-06-19 DIAGNOSIS — H2513 Age-related nuclear cataract, bilateral: Secondary | ICD-10-CM | POA: Diagnosis not present

## 2024-06-19 DIAGNOSIS — H353123 Nonexudative age-related macular degeneration, left eye, advanced atrophic without subfoveal involvement: Secondary | ICD-10-CM | POA: Diagnosis not present

## 2024-06-19 DIAGNOSIS — H35033 Hypertensive retinopathy, bilateral: Secondary | ICD-10-CM | POA: Diagnosis not present

## 2024-06-19 DIAGNOSIS — H353211 Exudative age-related macular degeneration, right eye, with active choroidal neovascularization: Secondary | ICD-10-CM | POA: Diagnosis not present

## 2024-06-19 DIAGNOSIS — D3132 Benign neoplasm of left choroid: Secondary | ICD-10-CM | POA: Diagnosis not present

## 2024-06-21 DIAGNOSIS — G20A1 Parkinson's disease without dyskinesia, without mention of fluctuations: Secondary | ICD-10-CM | POA: Diagnosis not present

## 2024-06-21 DIAGNOSIS — I129 Hypertensive chronic kidney disease with stage 1 through stage 4 chronic kidney disease, or unspecified chronic kidney disease: Secondary | ICD-10-CM | POA: Diagnosis not present

## 2024-06-21 DIAGNOSIS — B9620 Unspecified Escherichia coli [E. coli] as the cause of diseases classified elsewhere: Secondary | ICD-10-CM | POA: Diagnosis not present

## 2024-06-21 DIAGNOSIS — N182 Chronic kidney disease, stage 2 (mild): Secondary | ICD-10-CM | POA: Diagnosis not present

## 2024-06-21 DIAGNOSIS — N390 Urinary tract infection, site not specified: Secondary | ICD-10-CM | POA: Diagnosis not present

## 2024-06-21 DIAGNOSIS — G9340 Encephalopathy, unspecified: Secondary | ICD-10-CM | POA: Diagnosis not present

## 2024-06-25 DIAGNOSIS — I129 Hypertensive chronic kidney disease with stage 1 through stage 4 chronic kidney disease, or unspecified chronic kidney disease: Secondary | ICD-10-CM | POA: Diagnosis not present

## 2024-06-25 DIAGNOSIS — G9340 Encephalopathy, unspecified: Secondary | ICD-10-CM | POA: Diagnosis not present

## 2024-06-25 DIAGNOSIS — N182 Chronic kidney disease, stage 2 (mild): Secondary | ICD-10-CM | POA: Diagnosis not present

## 2024-06-25 DIAGNOSIS — G20A1 Parkinson's disease without dyskinesia, without mention of fluctuations: Secondary | ICD-10-CM | POA: Diagnosis not present

## 2024-06-25 DIAGNOSIS — N390 Urinary tract infection, site not specified: Secondary | ICD-10-CM | POA: Diagnosis not present

## 2024-06-25 DIAGNOSIS — B9620 Unspecified Escherichia coli [E. coli] as the cause of diseases classified elsewhere: Secondary | ICD-10-CM | POA: Diagnosis not present

## 2024-06-27 DIAGNOSIS — N182 Chronic kidney disease, stage 2 (mild): Secondary | ICD-10-CM | POA: Diagnosis not present

## 2024-06-27 DIAGNOSIS — N390 Urinary tract infection, site not specified: Secondary | ICD-10-CM | POA: Diagnosis not present

## 2024-06-27 DIAGNOSIS — G9340 Encephalopathy, unspecified: Secondary | ICD-10-CM | POA: Diagnosis not present

## 2024-06-27 DIAGNOSIS — B9620 Unspecified Escherichia coli [E. coli] as the cause of diseases classified elsewhere: Secondary | ICD-10-CM | POA: Diagnosis not present

## 2024-06-27 DIAGNOSIS — I129 Hypertensive chronic kidney disease with stage 1 through stage 4 chronic kidney disease, or unspecified chronic kidney disease: Secondary | ICD-10-CM | POA: Diagnosis not present

## 2024-06-27 DIAGNOSIS — G20A1 Parkinson's disease without dyskinesia, without mention of fluctuations: Secondary | ICD-10-CM | POA: Diagnosis not present

## 2024-07-03 DIAGNOSIS — B9620 Unspecified Escherichia coli [E. coli] as the cause of diseases classified elsewhere: Secondary | ICD-10-CM | POA: Diagnosis not present

## 2024-07-03 DIAGNOSIS — G9340 Encephalopathy, unspecified: Secondary | ICD-10-CM | POA: Diagnosis not present

## 2024-07-03 DIAGNOSIS — N390 Urinary tract infection, site not specified: Secondary | ICD-10-CM | POA: Diagnosis not present

## 2024-07-03 DIAGNOSIS — I129 Hypertensive chronic kidney disease with stage 1 through stage 4 chronic kidney disease, or unspecified chronic kidney disease: Secondary | ICD-10-CM | POA: Diagnosis not present

## 2024-07-03 DIAGNOSIS — N182 Chronic kidney disease, stage 2 (mild): Secondary | ICD-10-CM | POA: Diagnosis not present

## 2024-07-03 DIAGNOSIS — G20A1 Parkinson's disease without dyskinesia, without mention of fluctuations: Secondary | ICD-10-CM | POA: Diagnosis not present

## 2024-07-04 DIAGNOSIS — G20B2 Parkinson's disease with dyskinesia, with fluctuations: Secondary | ICD-10-CM | POA: Diagnosis not present

## 2024-07-07 DIAGNOSIS — H3530 Unspecified macular degeneration: Secondary | ICD-10-CM | POA: Diagnosis not present

## 2024-07-07 DIAGNOSIS — I951 Orthostatic hypotension: Secondary | ICD-10-CM | POA: Diagnosis not present

## 2024-07-07 DIAGNOSIS — M797 Fibromyalgia: Secondary | ICD-10-CM | POA: Diagnosis not present

## 2024-07-07 DIAGNOSIS — Z8701 Personal history of pneumonia (recurrent): Secondary | ICD-10-CM | POA: Diagnosis not present

## 2024-07-07 DIAGNOSIS — N390 Urinary tract infection, site not specified: Secondary | ICD-10-CM | POA: Diagnosis not present

## 2024-07-07 DIAGNOSIS — I129 Hypertensive chronic kidney disease with stage 1 through stage 4 chronic kidney disease, or unspecified chronic kidney disease: Secondary | ICD-10-CM | POA: Diagnosis not present

## 2024-07-07 DIAGNOSIS — Z7982 Long term (current) use of aspirin: Secondary | ICD-10-CM | POA: Diagnosis not present

## 2024-07-07 DIAGNOSIS — G20A1 Parkinson's disease without dyskinesia, without mention of fluctuations: Secondary | ICD-10-CM | POA: Diagnosis not present

## 2024-07-07 DIAGNOSIS — Z87891 Personal history of nicotine dependence: Secondary | ICD-10-CM | POA: Diagnosis not present

## 2024-07-07 DIAGNOSIS — I341 Nonrheumatic mitral (valve) prolapse: Secondary | ICD-10-CM | POA: Diagnosis not present

## 2024-07-07 DIAGNOSIS — E7800 Pure hypercholesterolemia, unspecified: Secondary | ICD-10-CM | POA: Diagnosis not present

## 2024-07-07 DIAGNOSIS — G9340 Encephalopathy, unspecified: Secondary | ICD-10-CM | POA: Diagnosis not present

## 2024-07-07 DIAGNOSIS — N182 Chronic kidney disease, stage 2 (mild): Secondary | ICD-10-CM | POA: Diagnosis not present

## 2024-07-07 DIAGNOSIS — M8580 Other specified disorders of bone density and structure, unspecified site: Secondary | ICD-10-CM | POA: Diagnosis not present

## 2024-07-07 DIAGNOSIS — I447 Left bundle-branch block, unspecified: Secondary | ICD-10-CM | POA: Diagnosis not present

## 2024-07-07 DIAGNOSIS — B9620 Unspecified Escherichia coli [E. coli] as the cause of diseases classified elsewhere: Secondary | ICD-10-CM | POA: Diagnosis not present

## 2024-07-07 DIAGNOSIS — G609 Hereditary and idiopathic neuropathy, unspecified: Secondary | ICD-10-CM | POA: Diagnosis not present

## 2024-07-07 DIAGNOSIS — Z9181 History of falling: Secondary | ICD-10-CM | POA: Diagnosis not present

## 2024-07-07 DIAGNOSIS — F419 Anxiety disorder, unspecified: Secondary | ICD-10-CM | POA: Diagnosis not present

## 2024-07-07 DIAGNOSIS — G319 Degenerative disease of nervous system, unspecified: Secondary | ICD-10-CM | POA: Diagnosis not present

## 2024-07-07 DIAGNOSIS — Z85820 Personal history of malignant melanoma of skin: Secondary | ICD-10-CM | POA: Diagnosis not present

## 2024-07-11 DIAGNOSIS — G9340 Encephalopathy, unspecified: Secondary | ICD-10-CM | POA: Diagnosis not present

## 2024-07-11 DIAGNOSIS — N182 Chronic kidney disease, stage 2 (mild): Secondary | ICD-10-CM | POA: Diagnosis not present

## 2024-07-11 DIAGNOSIS — I129 Hypertensive chronic kidney disease with stage 1 through stage 4 chronic kidney disease, or unspecified chronic kidney disease: Secondary | ICD-10-CM | POA: Diagnosis not present

## 2024-07-11 DIAGNOSIS — G20A1 Parkinson's disease without dyskinesia, without mention of fluctuations: Secondary | ICD-10-CM | POA: Diagnosis not present

## 2024-07-11 DIAGNOSIS — N390 Urinary tract infection, site not specified: Secondary | ICD-10-CM | POA: Diagnosis not present

## 2024-07-11 DIAGNOSIS — B9620 Unspecified Escherichia coli [E. coli] as the cause of diseases classified elsewhere: Secondary | ICD-10-CM | POA: Diagnosis not present

## 2024-07-16 DIAGNOSIS — E785 Hyperlipidemia, unspecified: Secondary | ICD-10-CM | POA: Diagnosis not present

## 2024-07-16 DIAGNOSIS — F419 Anxiety disorder, unspecified: Secondary | ICD-10-CM | POA: Diagnosis not present

## 2024-07-16 DIAGNOSIS — G20A1 Parkinson's disease without dyskinesia, without mention of fluctuations: Secondary | ICD-10-CM | POA: Diagnosis not present

## 2024-07-16 DIAGNOSIS — I129 Hypertensive chronic kidney disease with stage 1 through stage 4 chronic kidney disease, or unspecified chronic kidney disease: Secondary | ICD-10-CM | POA: Diagnosis not present

## 2024-07-16 DIAGNOSIS — G903 Multi-system degeneration of the autonomic nervous system: Secondary | ICD-10-CM | POA: Diagnosis not present

## 2024-07-16 DIAGNOSIS — B9620 Unspecified Escherichia coli [E. coli] as the cause of diseases classified elsewhere: Secondary | ICD-10-CM | POA: Diagnosis not present

## 2024-07-16 DIAGNOSIS — N3281 Overactive bladder: Secondary | ICD-10-CM | POA: Diagnosis not present

## 2024-07-16 DIAGNOSIS — G9340 Encephalopathy, unspecified: Secondary | ICD-10-CM | POA: Diagnosis not present

## 2024-07-16 DIAGNOSIS — N390 Urinary tract infection, site not specified: Secondary | ICD-10-CM | POA: Diagnosis not present

## 2024-07-16 DIAGNOSIS — G20B2 Parkinson's disease with dyskinesia, with fluctuations: Secondary | ICD-10-CM | POA: Diagnosis not present

## 2024-07-16 DIAGNOSIS — I10 Essential (primary) hypertension: Secondary | ICD-10-CM | POA: Diagnosis not present

## 2024-07-16 DIAGNOSIS — N182 Chronic kidney disease, stage 2 (mild): Secondary | ICD-10-CM | POA: Diagnosis not present

## 2024-07-18 DIAGNOSIS — I129 Hypertensive chronic kidney disease with stage 1 through stage 4 chronic kidney disease, or unspecified chronic kidney disease: Secondary | ICD-10-CM | POA: Diagnosis not present

## 2024-07-18 DIAGNOSIS — N390 Urinary tract infection, site not specified: Secondary | ICD-10-CM | POA: Diagnosis not present

## 2024-07-18 DIAGNOSIS — N182 Chronic kidney disease, stage 2 (mild): Secondary | ICD-10-CM | POA: Diagnosis not present

## 2024-07-18 DIAGNOSIS — B9620 Unspecified Escherichia coli [E. coli] as the cause of diseases classified elsewhere: Secondary | ICD-10-CM | POA: Diagnosis not present

## 2024-07-18 DIAGNOSIS — G9340 Encephalopathy, unspecified: Secondary | ICD-10-CM | POA: Diagnosis not present

## 2024-07-18 DIAGNOSIS — G20A1 Parkinson's disease without dyskinesia, without mention of fluctuations: Secondary | ICD-10-CM | POA: Diagnosis not present

## 2024-08-05 DIAGNOSIS — N182 Chronic kidney disease, stage 2 (mild): Secondary | ICD-10-CM | POA: Diagnosis not present

## 2024-08-05 DIAGNOSIS — I129 Hypertensive chronic kidney disease with stage 1 through stage 4 chronic kidney disease, or unspecified chronic kidney disease: Secondary | ICD-10-CM | POA: Diagnosis not present

## 2024-08-05 DIAGNOSIS — G9340 Encephalopathy, unspecified: Secondary | ICD-10-CM | POA: Diagnosis not present

## 2024-08-05 DIAGNOSIS — G20A1 Parkinson's disease without dyskinesia, without mention of fluctuations: Secondary | ICD-10-CM | POA: Diagnosis not present

## 2024-08-05 DIAGNOSIS — B9620 Unspecified Escherichia coli [E. coli] as the cause of diseases classified elsewhere: Secondary | ICD-10-CM | POA: Diagnosis not present

## 2024-08-05 DIAGNOSIS — N390 Urinary tract infection, site not specified: Secondary | ICD-10-CM | POA: Diagnosis not present

## 2024-08-06 ENCOUNTER — Other Ambulatory Visit (HOSPITAL_BASED_OUTPATIENT_CLINIC_OR_DEPARTMENT_OTHER): Payer: Self-pay

## 2024-08-07 ENCOUNTER — Other Ambulatory Visit: Payer: Self-pay

## 2024-08-07 ENCOUNTER — Other Ambulatory Visit (HOSPITAL_BASED_OUTPATIENT_CLINIC_OR_DEPARTMENT_OTHER): Payer: Self-pay

## 2024-08-09 DIAGNOSIS — H2513 Age-related nuclear cataract, bilateral: Secondary | ICD-10-CM | POA: Diagnosis not present

## 2024-08-09 DIAGNOSIS — H35033 Hypertensive retinopathy, bilateral: Secondary | ICD-10-CM | POA: Diagnosis not present

## 2024-08-09 DIAGNOSIS — H353211 Exudative age-related macular degeneration, right eye, with active choroidal neovascularization: Secondary | ICD-10-CM | POA: Diagnosis not present

## 2024-08-09 DIAGNOSIS — H43813 Vitreous degeneration, bilateral: Secondary | ICD-10-CM | POA: Diagnosis not present

## 2024-08-09 DIAGNOSIS — H353123 Nonexudative age-related macular degeneration, left eye, advanced atrophic without subfoveal involvement: Secondary | ICD-10-CM | POA: Diagnosis not present

## 2024-08-09 DIAGNOSIS — D3132 Benign neoplasm of left choroid: Secondary | ICD-10-CM | POA: Diagnosis not present

## 2024-08-12 NOTE — Progress Notes (Unsigned)
 [name] [address] [place] [address] Phone: [phone] Subjective:   Morgan [name], am serving as a scribe for Dr. Arthea Claudene.  I'm seeing this patient by the request  of:  [name], MD  CC: Multiple joint complaints, Parkinson's  YEP:Dlagzrupcz  03/05/2024 Patient is still working hard on trying to increase her strength.  Working with a systems analyst.  Seems to be somewhat fatigued at the moment.  We discussed icing regimen and home exercises, increase activity slowly.  Follow-up with me again in 12 weeks.  Did discuss potential treatment with creatine.  I do think that they have changed to the long-acting medications could be helpful as well.  Total time with patient as well as reviewing outside records 31 minutes      Update 08/13/2024 Morgan [name] is a 73 y.o. female coming in with complaint of myalgia. Patient states that today she is aching everywhere.  Patient was in the hospital for some time.  Also has had recurrent UTIs.  Now on prophylactic antibiotics.     Past Medical History:  Diagnosis Date   Anxiety    Fibromyalgia    Headache    Hypercholesteremia    controlled on medication   Hyperlipidemia    Hypertension    no current meds   LBBB (left bundle branch block)    Melanoma (HCC)    MVP (mitral valve prolapse)    Osteopenia    Parkinson disease (HCC)    Pneumonia    SVD (spontaneous vaginal delivery)    x 2   Past Surgical History:  Procedure Laterality Date   BLEPHAROPLASTY     cold knife conization     COLONOSCOPY     FH colon CA   CYSTOSCOPY W/ URETERAL STENT PLACEMENT Left 10/18/2021   Procedure: CYSTOSCOPY WITH RETROGRADE PYELOGRAM, URETERAL STENT PLACEMENT;  Surgeon: Nieves Cough, MD;  Location: [facility];  Service: Urology;  Laterality: Left;   DILATATION & CURETTAGE/HYSTEROSCOPY WITH MYOSURE N/A 02/14/2018   Procedure: ATTEMPTED DILATATION & CURETTAGE/HYSTEROSCOPY WITH MYOSURE;   Surgeon: Rosalva Sawyer, MD;  Location: [facility];  Service: Gynecology;  Laterality: N/A;  polyp   melanoma cancer     removed from left leg   RECONSTRUCTION OF EYELID     TONSILLECTOMY     TUBAL LIGATION     URETEROSCOPY WITH HOLMIUM LASER LITHOTRIPSY Left 12/17/2021   Procedure: URETEROSCOPY/HOLMIUM LASER/STENT EXCHANGE;  Surgeon: Nieves Cough, MD;  Location: [facility];  Service: Urology;  Laterality: Left;   WISDOM TOOTH EXTRACTION     Social History   Socioeconomic History   Marital status: Married    Spouse name: Not on file   Number of children: 2   Years of education: Not on file   Highest education level: Bachelor's degree (e.g., BA, AB, BS)  Occupational History   Occupation: retired    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  Tobacco Use   Smoking status: Former    Current packs/day: 0.00    Average packs/day: 0.3 packs/day    Types: Cigarettes    Quit date: 09/29/1997    Years since quitting: 26.8   Smokeless tobacco: Never   Tobacco comments:    chews Nicorette, 10-12/day  Vaping Use   Vaping status: Never Used  Substance and Sexual Activity   Alcohol  use: Yes    Comment: Occasional wine   Drug use: No   Sexual activity: Not on file  Other Topics Concern   Not  on file  Social History Narrative   right handed   2 story home    Lives with spouse   Social Drivers of Health   Tobacco Use: Medium Risk (05/31/2024)   Patient History    Smoking Tobacco Use: Former    Smokeless Tobacco Use: Never    Passive Exposure: Not on file  Financial Resource Strain: Low Risk (09/24/2023)   Received from [facility] Social Needs Screening - Medical Financial Resource Strain    Would you like help with any of the following needs? (Select ALL that apply): I don't want help with any of these  Food Insecurity: No Food Insecurity (05/31/2024)   Epic    Worried About Programme Researcher, Broadcasting/film/video in the Last Year: Never true    Ran Out of Food in the Last Year: Never true   Transportation Needs: No Transportation Needs (05/31/2024)   Epic    Lack of Transportation (Medical): No    Lack of Transportation (Non-Medical): No  Physical Activity: Not on file  Stress: Not on file  Social Connections: Socially Integrated (05/31/2024)   Social Connection and Isolation Panel    Frequency of Communication with Friends and Family: Three times a week    Frequency of Social Gatherings with Friends and Family: Three times a week    Attends Religious Services: More than 4 times per year    Active Member of Clubs or Organizations: Not on file    Attends Club or Organization Meetings: More than 4 times per year    Marital Status: Married  Depression (EYV7-0): Not on file  Alcohol  Screen: Not on file  Housing: Low Risk (05/31/2024)   Epic    Unable to Pay for Housing in the Last Year: No    Number of Times Moved in the Last Year: 0    Homeless in the Last Year: No  Utilities: Not At Risk (05/31/2024)   Epic    Threatened with loss of utilities: No  Health Literacy: Not on file   Allergies[1] Family History  Problem Relation Age of Onset   Cancer Mother        colon   Healthy Brother    Healthy Sister    Healthy Child     Current Outpatient Medications (Endocrine & Metabolic):    raloxifene  (EVISTA ) 60 MG tablet, Take 60 mg by mouth in the morning.   raloxifene  (EVISTA ) 60 MG tablet, Take 1 tablet (60 mg total) by mouth daily.  Current Outpatient Medications (Cardiovascular):    amLODipine  (NORVASC ) 5 MG tablet, Take 5 mg by mouth daily.   atorvastatin  (LIPITOR) 10 MG tablet, Take 10 mg by mouth in the morning.  Current Outpatient Medications (Analgesics):    aspirin  EC 81 MG tablet, Take 81 mg by mouth daily. Swallow whole.   Ibuprofen  200 MG CAPS, Take 2 capsules (400 mg total) by mouth every 8 (eight) hours as needed (Leg pain, headache or mild pain).  Current Outpatient Medications (Other):    acidophilus (RISAQUAD) CAPS capsule, Take 1 capsule by mouth  daily.   ALPRAZolam (XANAX) 0.25 MG tablet, Take 0.25 mg by mouth daily as needed for anxiety.   BLINK TEARS 0.25 % SOLN, Place 1 drop into both eyes 3 (three) times daily as needed (for dryness).   carbidopa -levodopa  (SINEMET  IR) 25-100 MG tablet, 1 tablet every 2 hours starting at 6-7am (getting about 6-7 in per day)The medication have a score line so it can be broken.  Carbidopa -Levodopa  ER (SINEMET  CR) 25-100 MG tablet controlled release, Take 1 tablet by mouth 3 (three) times daily.   Carbidopa -Levodopa  ER (SINEMET  CR) 25-100 MG tablet controlled release, Take 1 tablet by mouth 2 (two) times daily.   Cephalexin  250 MG tablet, Take 250 mg by mouth as needed (for uti).   Cholecalciferol (VITAMIN D3) 25 MCG (1000 UT) CHEW, Chew 2,500 Units by mouth daily.   Coenzyme Q10-Vitamin E (QUNOL ULTRA COQ10 PO), Take 1 capsule by mouth at bedtime.   CYMBALTA  60 MG capsule, TAKE 1 CAPSULE EVERY       MORNING   estradiol (ESTRACE) 0.1 MG/GM vaginal cream, Place 1 Applicatorful vaginally 3 (three) times a week. (Patient not taking: Reported on 05/31/2024)   Krill Oil 500 MG CAPS, Take 500 mg by mouth at bedtime.   magnesium  oxide (MAG-OX) 400 MG tablet, Take 400 mg by mouth at bedtime.   methocarbamol  (ROBAXIN ) 750 MG tablet, Take 750 mg by mouth every 6 (six) hours as needed for muscle spasms (or leg pain).   mirabegron  ER (MYRBETRIQ ) 25 MG TB24 tablet, Take 1 tablet (25 mg total) by mouth daily.   Multiple Vitamin (MULTIVITAMIN WITH MINERALS) TABS tablet, Take 1 tablet by mouth daily.   Multiple Vitamins-Minerals (PRESERVISION AREDS 2 PO), Take 1 capsule by mouth 2 (two) times daily.   TART CHERRY PO, Take 3,000 mg by mouth at bedtime.   Vibegron (GEMTESA) 75 MG TABS, Take 75 mg by mouth in the morning.   Reviewed prior external information including notes and imaging from  primary care provider As well as notes that were available from care everywhere and other healthcare systems.  Past medical  history, social, surgical and family history all reviewed in electronic medical record.  No pertanent information unless stated regarding to the chief complaint.   Review of Systems:  No headache, visual changes, nausea, vomiting, diarrhea, constipation, dizziness, abdominal pain, skin rash, fevers, chills, night sweats, weight loss, swollen lymph nodes, joint swelling, chest pain, shortness of breath, mood changes. POSITIVE muscle aches, body aches  Objective  Blood pressure 122/84, pulse (!) 105, height 5' 5 (1.651 m), weight 171 lb (77.6 kg), SpO2 97%.   General: No apparent distress alert seems extremely uncomfortable. HEENT: Pupils equal, extraocular movements intact masked facies noted. Respiratory: Patient's speak in full sentences and does not appear short of breath  Cardiovascular: No lower extremity edema, non tender, no erythema  Antalgic gait with mild shuffling.  Walks with the aid of a cane    Impression and Recommendations:    The above documentation has been reviewed and is accurate and complete Arthea CHRISTELLA Sharps, DO        [1]  Allergies Allergen Reactions   Anti-Seizure Depletion [A-G Pro] Anaphylaxis   Phenergan [Promethazine Hcl] Other (See Comments)    Makes Parkinson's worse   Acetaminophen  Other (See Comments)    I just get weird   Promethazine Other (See Comments)    Husband unaware of reaction    Itraconazole Hives    Other Reaction(s): Unknown Spornox    Tamsulosin  Other (See Comments)    B/P DROPPED TOO MUCH!!

## 2024-08-13 ENCOUNTER — Ambulatory Visit: Admitting: Family Medicine

## 2024-08-13 VITALS — BP 122/84 | HR 105 | Ht 65.0 in | Wt 171.0 lb

## 2024-08-13 DIAGNOSIS — R5381 Other malaise: Secondary | ICD-10-CM

## 2024-08-13 DIAGNOSIS — G20B2 Parkinson's disease with dyskinesia, with fluctuations: Secondary | ICD-10-CM

## 2024-08-13 NOTE — Patient Instructions (Signed)
 Home PT and Home OT See me in 2-3 months

## 2024-08-13 NOTE — Assessment & Plan Note (Signed)
 Significant deconditioning noted again.  Patient's bacteremia with the E. coli and I do think with the hospitalization has caused difficulty.  I do feel that still formal physical therapy and for this individual secondary to social determinants of health and difficulty with transportation I would highly recommend home health physical therapy for a longer duration.  I do think at some point may be able to transition at a later date to outpatient physical therapy.  May need some help with Occupational Therapy as well.  Multiple different comorbidities including patient's Parkinson's disease does make this significantly complicated as well as the peripheral neuropathy and the fibromyalgia.  Patient was accompanied with her spouse and primary caregiver.  Discussed this at great length.  Follow-up again in 6 to 8 weeks otherwise.

## 2024-08-24 ENCOUNTER — Other Ambulatory Visit (HOSPITAL_BASED_OUTPATIENT_CLINIC_OR_DEPARTMENT_OTHER): Payer: Self-pay

## 2024-08-26 ENCOUNTER — Other Ambulatory Visit (HOSPITAL_BASED_OUTPATIENT_CLINIC_OR_DEPARTMENT_OTHER): Payer: Self-pay

## 2024-08-30 ENCOUNTER — Other Ambulatory Visit (HOSPITAL_BASED_OUTPATIENT_CLINIC_OR_DEPARTMENT_OTHER): Payer: Self-pay

## 2024-09-02 ENCOUNTER — Other Ambulatory Visit (HOSPITAL_BASED_OUTPATIENT_CLINIC_OR_DEPARTMENT_OTHER): Payer: Self-pay

## 2024-09-02 MED ORDER — OSELTAMIVIR PHOSPHATE 75 MG PO CAPS
75.0000 mg | ORAL_CAPSULE | Freq: Two times a day (BID) | ORAL | 0 refills | Status: AC
  Filled 2024-09-02: qty 10, 5d supply, fill #0

## 2024-10-30 ENCOUNTER — Ambulatory Visit: Admitting: Family Medicine

## 2025-02-05 ENCOUNTER — Ambulatory Visit: Admitting: Cardiovascular Disease
